# Patient Record
Sex: Female | Born: 1975
Health system: Southern US, Community
[De-identification: ages and names within clinical notes are randomized; demographics above are authoritative.]

## PROBLEM LIST (undated history)

## (undated) DIAGNOSIS — I10 Essential (primary) hypertension: Secondary | ICD-10-CM

## (undated) DIAGNOSIS — J189 Pneumonia, unspecified organism: Secondary | ICD-10-CM

## (undated) DIAGNOSIS — N870 Mild cervical dysplasia: Secondary | ICD-10-CM

## (undated) DIAGNOSIS — F419 Anxiety disorder, unspecified: Secondary | ICD-10-CM

## (undated) DIAGNOSIS — D689 Coagulation defect, unspecified: Secondary | ICD-10-CM

## (undated) DIAGNOSIS — R896 Abnormal cytological findings in specimens from other organs, systems and tissues: Secondary | ICD-10-CM

## (undated) DIAGNOSIS — F329 Major depressive disorder, single episode, unspecified: Secondary | ICD-10-CM

## (undated) DIAGNOSIS — F17200 Nicotine dependence, unspecified, uncomplicated: Secondary | ICD-10-CM

## (undated) DIAGNOSIS — B977 Papillomavirus as the cause of diseases classified elsewhere: Secondary | ICD-10-CM

## (undated) DIAGNOSIS — R51 Headache: Secondary | ICD-10-CM

## (undated) DIAGNOSIS — C801 Malignant (primary) neoplasm, unspecified: Secondary | ICD-10-CM

## (undated) DIAGNOSIS — R569 Unspecified convulsions: Secondary | ICD-10-CM

## (undated) DIAGNOSIS — D6800 Von Willebrand disease, unspecified: Secondary | ICD-10-CM

## (undated) DIAGNOSIS — K219 Gastro-esophageal reflux disease without esophagitis: Secondary | ICD-10-CM

## (undated) DIAGNOSIS — M1990 Unspecified osteoarthritis, unspecified site: Secondary | ICD-10-CM

## (undated) DIAGNOSIS — N809 Endometriosis, unspecified: Secondary | ICD-10-CM

## (undated) DIAGNOSIS — T7840XA Allergy, unspecified, initial encounter: Secondary | ICD-10-CM

## (undated) DIAGNOSIS — F32A Depression, unspecified: Secondary | ICD-10-CM

## (undated) HISTORY — DX: Gastro-esophageal reflux disease without esophagitis: K21.9

## (undated) HISTORY — PX: TONSILLECTOMY: SUR1361

## (undated) HISTORY — DX: Allergy, unspecified, initial encounter: T78.40XA

## (undated) HISTORY — PX: BREAST BIOPSY: SHX20

## (undated) HISTORY — DX: Papillomavirus as the cause of diseases classified elsewhere: B97.7

## (undated) HISTORY — DX: Depression, unspecified: F32.A

## (undated) HISTORY — DX: Major depressive disorder, single episode, unspecified: F32.9

## (undated) HISTORY — DX: Mild cervical dysplasia: N87.0

## (undated) HISTORY — DX: Von Willebrand's disease: D68.0

## (undated) HISTORY — PX: COLPOSCOPY: SHX161

## (undated) HISTORY — DX: Nicotine dependence, unspecified, uncomplicated: F17.200

## (undated) HISTORY — PX: BRAIN SURGERY: SHX531

## (undated) HISTORY — DX: Coagulation defect, unspecified: D68.9

## (undated) HISTORY — PX: DILATION AND CURETTAGE OF UTERUS: SHX78

## (undated) HISTORY — DX: Endometriosis, unspecified: N80.9

## (undated) HISTORY — DX: Von Willebrand disease, unspecified: D68.00

---
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
## 1999-02-20 ENCOUNTER — Emergency Department (HOSPITAL_COMMUNITY): Admission: EM | Admit: 1999-02-20 | Discharge: 1999-02-20 | Payer: Self-pay | Admitting: *Deleted

## 1999-02-20 ENCOUNTER — Encounter: Payer: Self-pay | Admitting: *Deleted

## 1999-07-04 ENCOUNTER — Emergency Department (HOSPITAL_COMMUNITY): Admission: EM | Admit: 1999-07-04 | Discharge: 1999-07-04 | Payer: Self-pay | Admitting: *Deleted

## 2003-09-12 ENCOUNTER — Emergency Department (HOSPITAL_COMMUNITY): Admission: EM | Admit: 2003-09-12 | Discharge: 2003-09-12 | Payer: Self-pay | Admitting: Emergency Medicine

## 2004-06-17 ENCOUNTER — Encounter: Admission: RE | Admit: 2004-06-17 | Discharge: 2004-09-15 | Payer: Self-pay | Admitting: Internal Medicine

## 2007-10-29 ENCOUNTER — Encounter: Admission: RE | Admit: 2007-10-29 | Discharge: 2007-10-29 | Payer: Self-pay | Admitting: Pediatrics

## 2007-11-27 ENCOUNTER — Emergency Department (HOSPITAL_COMMUNITY): Admission: EM | Admit: 2007-11-27 | Discharge: 2007-11-27 | Payer: Self-pay | Admitting: Emergency Medicine

## 2008-08-31 ENCOUNTER — Emergency Department (HOSPITAL_COMMUNITY): Admission: EM | Admit: 2008-08-31 | Discharge: 2008-09-01 | Payer: Self-pay | Admitting: Emergency Medicine

## 2008-09-09 ENCOUNTER — Other Ambulatory Visit: Admission: RE | Admit: 2008-09-09 | Discharge: 2008-09-09 | Payer: Self-pay | Admitting: Obstetrics and Gynecology

## 2009-01-02 ENCOUNTER — Emergency Department (HOSPITAL_COMMUNITY): Admission: EM | Admit: 2009-01-02 | Discharge: 2009-01-02 | Payer: Self-pay | Admitting: Emergency Medicine

## 2009-11-30 ENCOUNTER — Emergency Department (HOSPITAL_COMMUNITY): Admission: EM | Admit: 2009-11-30 | Discharge: 2009-11-30 | Payer: Self-pay | Admitting: Emergency Medicine

## 2010-05-12 LAB — URINALYSIS, ROUTINE W REFLEX MICROSCOPIC
Hgb urine dipstick: NEGATIVE
Specific Gravity, Urine: 1.019 (ref 1.005–1.030)
Urobilinogen, UA: 0.2 mg/dL (ref 0.0–1.0)
pH: 7.5 (ref 5.0–8.0)

## 2010-05-12 LAB — BASIC METABOLIC PANEL
Chloride: 104 mEq/L (ref 96–112)
GFR calc non Af Amer: >60 mL/min (ref >60)
Potassium: 4.2 mEq/L (ref 3.5–5.1)
Sodium: 139 mEq/L (ref 135–145)

## 2010-05-12 LAB — DIFFERENTIAL
Basophils Absolute: 0 10*3/uL (ref 0.0–0.1)
Lymphocytes Relative: 30 % (ref 12–46)
Lymphs Abs: 2.3 10*3/uL (ref 0.7–4.0)
Monocytes Absolute: 0.2 10*3/uL (ref 0.1–1.0)
Monocytes Relative: 3 % (ref 3–12)
Neutro Abs: 4.9 10*3/uL (ref 1.7–7.7)

## 2010-05-12 LAB — CBC
HCT: 36.5 % (ref 36.0–46.0)
Hemoglobin: 12.4 g/dL (ref 12.0–15.0)
RBC: 3.84 MIL/uL — ABNORMAL LOW (ref 3.87–5.11)
RDW: 14.3 % (ref 11.5–15.5)
WBC: 7.5 10*3/uL (ref 4.0–10.5)

## 2010-05-12 LAB — POCT PREGNANCY, URINE: Preg Test, Ur: NEGATIVE

## 2010-05-12 LAB — D-DIMER, QUANTITATIVE: D-Dimer, Quant: 0.26 ug/mL-FEU (ref 0.00–0.48)

## 2010-06-01 LAB — URINALYSIS, ROUTINE W REFLEX MICROSCOPIC
Glucose, UA: NEGATIVE mg/dL
Hgb urine dipstick: NEGATIVE
Ketones, ur: NEGATIVE mg/dL
Protein, ur: NEGATIVE mg/dL

## 2010-06-01 LAB — BASIC METABOLIC PANEL
BUN: 11 mg/dL (ref 6–23)
CO2: 28 mEq/L (ref 19–32)
Chloride: 103 mEq/L (ref 96–112)
Creatinine, Ser: 0.57 mg/dL (ref 0.4–1.2)

## 2010-06-05 LAB — URINALYSIS, ROUTINE W REFLEX MICROSCOPIC
Bilirubin Urine: NEGATIVE
Ketones, ur: NEGATIVE mg/dL
Nitrite: NEGATIVE
Urobilinogen, UA: 0.2 mg/dL (ref 0.0–1.0)

## 2010-06-05 LAB — URINE MICROSCOPIC-ADD ON

## 2010-06-05 LAB — POCT I-STAT, CHEM 8
BUN: 10 mg/dL (ref 6–23)
Calcium, Ion: 1.1 mmol/L — ABNORMAL LOW (ref 1.12–1.32)
Chloride: 105 meq/L (ref 96–112)
Creatinine, Ser: 0.5 mg/dL (ref 0.4–1.2)
Glucose, Bld: 102 mg/dL — ABNORMAL HIGH (ref 70–99)
HCT: 43 % (ref 36.0–46.0)
Hemoglobin: 14.6 g/dL (ref 12.0–15.0)
Potassium: 3.4 mEq/L — ABNORMAL LOW (ref 3.5–5.1)
Sodium: 137 mEq/L (ref 135–145)
TCO2: 23 mmol/L (ref 0–100)

## 2010-06-05 LAB — DIFFERENTIAL
Basophils Absolute: 0.1 10*3/uL (ref 0.0–0.1)
Basophils Relative: 1 % (ref 0–1)
Eosinophils Relative: 0 % (ref 0–5)
Monocytes Absolute: 0.7 10*3/uL (ref 0.1–1.0)

## 2010-06-05 LAB — CBC
HCT: 40 % (ref 36.0–46.0)
Hemoglobin: 13.6 g/dL (ref 12.0–15.0)
MCHC: 34.1 g/dL (ref 30.0–36.0)
MCV: 90.4 fL (ref 78.0–100.0)
RDW: 14.2 % (ref 11.5–15.5)

## 2010-06-05 LAB — WET PREP, GENITAL
Trich, Wet Prep: NONE SEEN
Yeast Wet Prep HPF POC: NONE SEEN

## 2010-06-05 LAB — POCT PREGNANCY, URINE: Preg Test, Ur: NEGATIVE

## 2010-07-12 ENCOUNTER — Emergency Department (HOSPITAL_COMMUNITY): Payer: PRIVATE HEALTH INSURANCE

## 2010-07-12 ENCOUNTER — Emergency Department (HOSPITAL_COMMUNITY)
Admission: EM | Admit: 2010-07-12 | Discharge: 2010-07-13 | Disposition: A | Discharge status: Home / Self Care | Payer: PRIVATE HEALTH INSURANCE | Attending: Emergency Medicine | Admitting: Emergency Medicine

## 2010-07-12 DIAGNOSIS — M940 Chondrocostal junction syndrome [Tietze]: Secondary | ICD-10-CM | POA: Insufficient documentation

## 2010-07-12 DIAGNOSIS — G40909 Epilepsy, unspecified, not intractable, without status epilepticus: Secondary | ICD-10-CM | POA: Insufficient documentation

## 2010-07-12 DIAGNOSIS — R0602 Shortness of breath: Secondary | ICD-10-CM | POA: Insufficient documentation

## 2010-07-12 DIAGNOSIS — R071 Chest pain on breathing: Secondary | ICD-10-CM | POA: Insufficient documentation

## 2010-07-12 DIAGNOSIS — I498 Other specified cardiac arrhythmias: Secondary | ICD-10-CM | POA: Insufficient documentation

## 2010-07-12 DIAGNOSIS — F17200 Nicotine dependence, unspecified, uncomplicated: Secondary | ICD-10-CM | POA: Insufficient documentation

## 2010-08-28 DIAGNOSIS — N870 Mild cervical dysplasia: Secondary | ICD-10-CM

## 2010-08-28 HISTORY — DX: Mild cervical dysplasia: N87.0

## 2010-09-12 ENCOUNTER — Other Ambulatory Visit: Payer: Self-pay | Admitting: Gynecology

## 2010-09-12 ENCOUNTER — Ambulatory Visit (INDEPENDENT_AMBULATORY_CARE_PROVIDER_SITE_OTHER): Payer: PRIVATE HEALTH INSURANCE | Admitting: Gynecology

## 2010-09-12 ENCOUNTER — Other Ambulatory Visit (HOSPITAL_COMMUNITY)
Admission: RE | Admit: 2010-09-12 | Discharge: 2010-09-12 | Disposition: A | Discharge status: Home / Self Care | Payer: PRIVATE HEALTH INSURANCE | Source: Ambulatory Visit | Attending: Gynecology | Admitting: Gynecology

## 2010-09-12 DIAGNOSIS — Z13220 Encounter for screening for lipoid disorders: Secondary | ICD-10-CM

## 2010-09-12 DIAGNOSIS — N920 Excessive and frequent menstruation with regular cycle: Secondary | ICD-10-CM

## 2010-09-12 DIAGNOSIS — Z124 Encounter for screening for malignant neoplasm of cervix: Secondary | ICD-10-CM | POA: Insufficient documentation

## 2010-09-12 DIAGNOSIS — N926 Irregular menstruation, unspecified: Secondary | ICD-10-CM

## 2010-09-12 DIAGNOSIS — R823 Hemoglobinuria: Secondary | ICD-10-CM

## 2010-09-12 DIAGNOSIS — Z833 Family history of diabetes mellitus: Secondary | ICD-10-CM

## 2010-09-12 DIAGNOSIS — Z01419 Encounter for gynecological examination (general) (routine) without abnormal findings: Secondary | ICD-10-CM

## 2010-09-12 DIAGNOSIS — Z1231 Encounter for screening mammogram for malignant neoplasm of breast: Secondary | ICD-10-CM

## 2010-09-12 DIAGNOSIS — L251 Unspecified contact dermatitis due to drugs in contact with skin: Secondary | ICD-10-CM

## 2010-09-19 ENCOUNTER — Telehealth: Payer: Self-pay | Admitting: *Deleted

## 2010-09-19 NOTE — Telephone Encounter (Signed)
CALL COMPLETED. JW

## 2010-09-19 NOTE — Telephone Encounter (Signed)
PT CALLED BACK FROM WHEN MESSAGE LEFT RE:C&B APPT. NEEDED. CALLED PT BACK AND SPOKE WITH PT TO SCHEDULE APPT.

## 2010-09-22 ENCOUNTER — Telehealth: Payer: Self-pay | Admitting: *Deleted

## 2010-09-22 DIAGNOSIS — N939 Abnormal uterine and vaginal bleeding, unspecified: Secondary | ICD-10-CM

## 2010-09-22 MED ORDER — MEGESTROL ACETATE 20 MG PO TABS: 20.0000 mg | ORAL_TABLET | Freq: Two times a day (BID) | ORAL | Status: DC

## 2010-09-22 NOTE — Telephone Encounter (Signed)
(  SEE 09/12/10 NOTE IN HARD COPY) PT CALLED STATING SHE IS STILL BLEEDING ON LAST DAY OF PROVERA. HAS A SONOHYSTEROGRAPHY APPOINTMENT ON 09/26/10 WANTS TO KNOW IF SHE SHOULD RESCHEDULE?

## 2010-09-22 NOTE — Telephone Encounter (Signed)
PT INFORMED WITH THE BELOW MESSAGE. RX SENT TO PHARMACY.

## 2010-09-22 NOTE — Telephone Encounter (Signed)
Start patient on Megace 20 mg twice a day followup sonohysterogram appointment. Call in #20.

## 2010-09-26 ENCOUNTER — Other Ambulatory Visit: Payer: Self-pay

## 2010-09-26 ENCOUNTER — Inpatient Hospital Stay
Admission: RE | Admit: 2010-09-26 | Discharge: 2010-09-26 | Disposition: A | Discharge status: Home / Self Care | Payer: Self-pay | Source: Ambulatory Visit | Attending: Gynecology | Admitting: Gynecology

## 2010-09-26 ENCOUNTER — Encounter: Payer: Self-pay | Admitting: Gynecology

## 2010-09-26 ENCOUNTER — Telehealth: Payer: Self-pay

## 2010-09-26 ENCOUNTER — Ambulatory Visit (INDEPENDENT_AMBULATORY_CARE_PROVIDER_SITE_OTHER): Payer: PRIVATE HEALTH INSURANCE | Admitting: Gynecology

## 2010-09-26 ENCOUNTER — Other Ambulatory Visit: Payer: PRIVATE HEALTH INSURANCE

## 2010-09-26 DIAGNOSIS — N938 Other specified abnormal uterine and vaginal bleeding: Secondary | ICD-10-CM

## 2010-09-26 DIAGNOSIS — N949 Unspecified condition associated with female genital organs and menstrual cycle: Secondary | ICD-10-CM

## 2010-09-26 DIAGNOSIS — N920 Excessive and frequent menstruation with regular cycle: Secondary | ICD-10-CM

## 2010-09-26 DIAGNOSIS — N946 Dysmenorrhea, unspecified: Secondary | ICD-10-CM

## 2010-09-26 DIAGNOSIS — N921 Excessive and frequent menstruation with irregular cycle: Secondary | ICD-10-CM

## 2010-09-26 DIAGNOSIS — N870 Mild cervical dysplasia: Secondary | ICD-10-CM

## 2010-09-26 DIAGNOSIS — N8300 Follicular cyst of ovary, unspecified side: Secondary | ICD-10-CM

## 2010-09-26 DIAGNOSIS — D259 Leiomyoma of uterus, unspecified: Secondary | ICD-10-CM

## 2010-09-26 DIAGNOSIS — D251 Intramural leiomyoma of uterus: Secondary | ICD-10-CM

## 2010-09-26 NOTE — Telephone Encounter (Signed)
Per Dr. Velvet Bathe order I called patient to schedule Hysteroscopy, D&C.  It is scheduled for Friday, August 17 at 7:30am at [facility]. Advised patient [facility] will be calling her with all her instructions.

## 2010-09-26 NOTE — Progress Notes (Signed)
Patient presents for sonohysterogram due to menorrhagia. She has a history of irregular periods but heavy then started 3 weeks prior and persisted bleeding on and off. She was placed on Megace for menstrual suppression and schedules a sonohysterogram. Of note her TSH and hCG were normal she did have a significantly elevated cholesterol at 335 and an LDL of 218 for which she knows to follow up with her primary M.D. Her ultrasound initially shows an endometrial echo of 15 mm right left ovaries are normal with physiologic changes and a small anterior myoma at 21 x 15 mm.  Sonohysterogram was performed sterile technique easy catheter duction good distention with multiple fingerlike projections suggestive of multiple polyps or a hyperplastic endometrium. Endometrial sample was taken. I discussed with patient regardless of return I think it would be prudent to proceed with hysteroscopy D&C to clear the endometrium. From a long-term standpoint I think a Mirena IUD would be a great choice for menstrual suppression as well as contraception but I would feel more comfortable clearing the endometrium with a more complete sample with a hysteroscopy D&C. We also discussed her dysplasia and she has a colposcopy appointment scheduled for next week. I discussed the various issues with dysplasia and she knows importance of followup. I discussed what is involved with hysteroscopy to include instrumentation use of the resectoscope and the D&C portion of the procedure. The risks of infection, hemorrhage necessitating transfusion, uterine perforation with damage to internal organs including bowel bladder ureters vessels and nerves either immediately recognized or delay recognized necessitating major exploratory reparative surgeries future reparative surgeries. The risk of distended media absorption with metabolic complications such as coma and seizures was also discussed with her. She agrees with proceeding with the hysteroscopy D&C and  we'll schedule this at her convenience. She does have a history of von Willebrand's but does not use DDAVP or any other medications to control I don't think this would be an issue with her procedure.

## 2010-09-27 ENCOUNTER — Ambulatory Visit: Payer: PRIVATE HEALTH INSURANCE

## 2010-09-28 ENCOUNTER — Telehealth: Payer: Self-pay | Admitting: *Deleted

## 2010-09-28 DIAGNOSIS — N926 Irregular menstruation, unspecified: Secondary | ICD-10-CM

## 2010-09-28 MED ORDER — MEGESTROL ACETATE 20 MG PO TABS: 20.0000 mg | ORAL_TABLET | Freq: Every day | ORAL | Status: DC

## 2010-09-28 NOTE — Telephone Encounter (Signed)
CHART ON YOUR DESK)PT CALLED STATING THAT SHE HAS STARTED  BLEEDING AGAIN. NOT A HEAVY FLOW NOW BUT SHE DOES STATE THAT IT FEELS LIKE HER PERIOD IS ABOUT TO START. SHE WANT TO KNOW IF SHE NEEDS START ON PROVERA AGAIN? SHE HAS A APPOINTMENT ON 10/03/10 FOR C&B.PLEASE ADVISE.

## 2010-09-28 NOTE — Telephone Encounter (Signed)
PT INFORMED WITH THE BELOW NOTE. 

## 2010-09-28 NOTE — Telephone Encounter (Signed)
Megace 20 mg twice a day times several days then go to once a day and stay on this through her hysteroscopy D&C

## 2010-09-30 ENCOUNTER — Encounter: Payer: Self-pay | Admitting: *Deleted

## 2010-10-03 ENCOUNTER — Encounter: Payer: Self-pay | Admitting: Gynecology

## 2010-10-03 ENCOUNTER — Ambulatory Visit (INDEPENDENT_AMBULATORY_CARE_PROVIDER_SITE_OTHER): Payer: PRIVATE HEALTH INSURANCE | Admitting: Gynecology

## 2010-10-03 VITALS — BP 132/90

## 2010-10-03 DIAGNOSIS — N870 Mild cervical dysplasia: Secondary | ICD-10-CM

## 2010-10-03 DIAGNOSIS — N920 Excessive and frequent menstruation with regular cycle: Secondary | ICD-10-CM

## 2010-10-03 NOTE — Progress Notes (Signed)
Patient presents with her husband for CMP had her first abnormal Pap smear showing low-grade SIL changes. She's also scheduled the end of next week for hysteroscopy D&C secondary to menorrhagia with sonohysterogram suggestive of questionable polyps with a very irregular endometrial border.  Colposcopic evaluation today acetic acid cleanse is adequate with an area of acetowhite change from 10 to 12:00 transformation zone. Representative biopsy was taken.  A long discussion with the patient and her husband about cervical dysplasia, high-grade, low-grade, progression or regression, HPV Association. She will followup within the week for her biopsy results. If normal or low-grade and we'll plan expectant management with every six-month Pap smears, if high grade we'll discuss treatment options.    She is without questions in reference to her upcoming hysteroscopy D&C. We are trying to find out the more about her history of von Willebrand's and whether any pretreatment as needed. Historically she does not have spontaneous bleeding issues and is not recommended to use anything as pretreatment.

## 2010-10-04 ENCOUNTER — Telehealth: Payer: Self-pay | Admitting: Gynecology

## 2010-10-04 NOTE — Telephone Encounter (Signed)
error 

## 2010-10-06 ENCOUNTER — Telehealth: Payer: Self-pay

## 2010-10-06 NOTE — Telephone Encounter (Signed)
I called patient and left message that on Tuesday 8/7 I had faxed request to [facility] to let Dr. Audie Box know if she would require any pretreatment for her Von Willebrands, prior to surgery.  I have not heard anything back from them yet and wondered if maybe that had contacted her for an appt instead of me.  I did let her know that it is possible we could have to reschedule her surgery if they cannot see her or get the info to Dr. Velvet Bathe before surgery date.

## 2010-10-06 NOTE — Telephone Encounter (Signed)
Pt. Called me back to let me know that [facility] called her on Tuesday. A lady named Dixie [phone]) called her.  They prescribed Stimate (? Sp) Nasal Spray for her to use for five days after surgery. She said they did not prescribe anything for before surgery.  I told her I will call them and ask them to fax me note.  She said the lady had told her that she was going to send Dr. Velvet Bathe the info.  I did put in a call to Dixie's voice mail and asked her to fax the info or call me.  I told patient I will call her if Dr. Velvet Bathe has any concerns or questions. Otherwise, surgery is a go.

## 2010-10-07 ENCOUNTER — Encounter (HOSPITAL_COMMUNITY): Payer: Self-pay

## 2010-10-07 ENCOUNTER — Encounter (HOSPITAL_COMMUNITY)
Admission: RE | Admit: 2010-10-07 | Discharge: 2010-10-07 | Disposition: A | Discharge status: Home / Self Care | Payer: PRIVATE HEALTH INSURANCE | Source: Ambulatory Visit | Attending: Gynecology | Admitting: Gynecology

## 2010-10-07 HISTORY — DX: Unspecified convulsions: R56.9

## 2010-10-07 HISTORY — DX: Anxiety disorder, unspecified: F41.9

## 2010-10-07 HISTORY — DX: Headache: R51

## 2010-10-07 HISTORY — DX: Unspecified osteoarthritis, unspecified site: M19.90

## 2010-10-07 LAB — CBC
HCT: 37.5 % (ref 36.0–46.0)
MCH: 31.2 pg (ref 26.0–34.0)
MCHC: 33.1 g/dL (ref 30.0–36.0)
MCV: 94.2 fL (ref 78.0–100.0)
RDW: 14.5 % (ref 11.5–15.5)

## 2010-10-07 LAB — COMPREHENSIVE METABOLIC PANEL
Albumin: 3.6 g/dL (ref 3.5–5.2)
BUN: 12 mg/dL (ref 6–23)
Calcium: 9.4 mg/dL (ref 8.4–10.5)
Creatinine, Ser: 0.63 mg/dL (ref 0.50–1.10)
GFR calc Af Amer: >60 mL/min (ref >60)
Total Protein: 7.2 g/dL (ref 6.0–8.3)

## 2010-10-07 LAB — PROTIME-INR
INR: 0.89 (ref 0.00–1.49)
Prothrombin Time: 12.2 seconds (ref 11.6–15.2)

## 2010-10-07 NOTE — Patient Instructions (Signed)
20 Unique Sillas  10/07/2010   Your procedure is scheduled on:  10/14/10  Report to [facility] at 0600 AM.  Call this number if you have problems the morning of surgery: [phone]   Remember:   Do not eat food:After Midnight.  Do not drink clear liquids: After Midnight.  Take these medicines the morning of surgery with A SIP OF WATER: per anesthesia   Do not wear jewelry, make-up or nail polish.  Do not wear lotions, powders, or perfumes. You may wear deodorant.  Do not shave 48 hours prior to surgery.  Do not bring valuables to the hospital.  Contacts, dentures or bridgework may not be worn into surgery.  Leave suitcase in the car. After surgery it may be brought to your room.  For patients admitted to the hospital, checkout time is 11:00 AM the day of discharge.   Patients discharged the day of surgery will not be allowed to drive home.  Name and phone number of your driver: Jenel Lucks- [phone]  Special Instructions: none

## 2010-10-07 NOTE — Anesthesia Preprocedure Evaluation (Signed)
Anesthesia Evaluation  Name, MR# and DOB Patient awake  General Assessment Comment  Reviewed: Allergy & Precautions, H&P , NPO status , Patient's Chart, lab work & pertinent test results  Airway Mallampati: III TM Distance: >3 FB Neck ROM: Full    Dental No notable dental hx. (+) Teeth Intact   Pulmonary  asthma and Inhaler asthma  clear to auscultation  pulmonary exam normalPulmonary Exam Normal breath sounds clear to auscultation none    Cardiovascular Regular Normal    Neuro/Psych    (+) Anxiety,  Negative Neurological ROS  Negative Psych ROS  GI/Hepatic/Renal negative GI ROS, negative Liver ROS, and negative Renal ROS (+)       Endo/Other  Negative Endocrine ROS (+)   Morbid obesity  Abdominal (+) obese,   Musculoskeletal   Hematology   Peds  Reproductive/Obstetrics    Anesthesia Other Findings             Anesthesia Physical Anesthesia Plan  ASA: III  Anesthesia Plan: General   Post-op Pain Management:    Induction: Intravenous  Airway Management Planned: LMA  Additional Equipment:   Intra-op Plan:   Post-operative Plan: Extubation in OR  Informed Consent: I have reviewed the patients History and Physical, chart, labs and discussed the procedure including the risks, benefits and alternatives for the proposed anesthesia with the patient or authorized representative who has indicated his/her understanding and acceptance.   Dental advisory given  Plan Discussed with: Anesthesiologist (AP)  Anesthesia Plan Comments:         Anesthesia Quick Evaluation

## 2010-10-11 NOTE — H&P (Signed)
  Cassidy Tran [birth year] [id number]   History and Physical    Chief complaint: menorrhagia irregular menses  History of present illness: 35 year old G0 history of regular menses although heavy when they occur lasting 7-8 days requiring double protection with bleed through episodes. Most recently she had a three-week history of prolonged bleeding ultimately was started on Megace for bleeding control and had a sonohysterogram which showed multiple fingerlike projections represent either a hyperplastic endometrium or endometrial polyps. Patient is admitted for hysteroscopy D&C removal of endometrial polyps. Her outpatient evaluation included a negative endometrial biopsy a normal TSH and a CBC showing a hemoglobin of 13.8. She does have a history of von Willebrand's but is not chronically treated with medication for this.  She also received had a Pap smear showing low-grade SIL changes with colposcopic directed biopsies confirming low-grade changes with planned followup Pap in 6 months.  Past medical history:  Von Willebrand's disease, seizure disorder, asthma  Past surgical history:  Tonsillectomy, sinus surgery, oral wisdom teeth extraction  Allergies:  Aspirin; Codeine; Erythromycin; and Penicillins  Current medications:  Per medication reconciliation  Review of systems: Noncontributory  Family history: Noncontributory   Social history: Noncontributory   Physical exam:  Afebrile vital signs are stable  General: well developed, well nourished female, no acute distress HEENT: normal  Lungs: clear to auscultation without wheezing, rales or rhonchi  Cardiac: regular rate without rubs, murmurs or gallops  Abdomen: soft, nontender without masses, guarding, rebound, organomegaly  Pelvic: external bus vagina: normal   Cervix: grossly normal  Uterus: normal size, midline and mobile, nontender  Adnexa: without masses or tenderness  Rectovaginal exam within normal limits   Assessment:  43 six-year-old G0 with menorrhagia recent prolonged bleeding episode negative hormone studies and a sonohysterogram suggesting a hyperplastic endometrial pattern versus multiple fingerlike endometrial polyps.  Endometrial biopsy was benign.  She does have a history of von Willebrand's disease.   Plan: Patient is admitted for hysteroscopy D&C.  I discussed what is involved with hysteroscopy to include instrumentation use of the resectoscope and the D&C portion of the procedure. The risks of infection, hemorrhage necessitating transfusion and the risks of transfusion, uterine perforation with damage to internal organs including bowel bladder ureters vessels and nerves either immediately recognized or delay recognized necessitating major exploratory reparative surgeries future reparative surgeries. The Tran of distended media absorption with metabolic complications such as coma and seizures was also discussed with her. She does have a history of von Willebrand's disease we contacted her hematologist Dr. Marlena Clipper who recommended DDAVP 0.3 mcg per kilogram in 100 cc normal saline IV over 20 minutes one hour preop. He had prescribed DDAVP nasal spray for her to be used one spray each nostril starting 24 hours postop for 5 days. Patient's questions were answered and she is ready to proceed with surgery.    Dara Lords MD, 4:39 PM 10/11/2010

## 2010-10-13 MED ORDER — CLINDAMYCIN PHOSPHATE 900 MG/50ML IV SOLN
900.0000 mg | INTRAVENOUS | Status: DC
  Filled 2010-10-13: qty 50

## 2010-10-13 MED ORDER — CIPROFLOXACIN IN D5W 400 MG/200ML IV SOLN
400.0000 mg | INTRAVENOUS | Status: DC
  Filled 2010-10-13: qty 200

## 2010-10-14 ENCOUNTER — Ambulatory Visit (HOSPITAL_COMMUNITY): Payer: PRIVATE HEALTH INSURANCE | Admitting: Anesthesiology

## 2010-10-14 ENCOUNTER — Encounter (HOSPITAL_COMMUNITY)
Admission: RE | Disposition: A | Discharge status: Home Health | Payer: Self-pay | Source: Ambulatory Visit | Attending: Gynecology

## 2010-10-14 ENCOUNTER — Other Ambulatory Visit: Payer: Self-pay | Admitting: Gynecology

## 2010-10-14 ENCOUNTER — Ambulatory Visit (HOSPITAL_COMMUNITY)
Admission: RE | Admit: 2010-10-14 | Discharge: 2010-10-14 | Disposition: A | Discharge status: Home Health | Payer: PRIVATE HEALTH INSURANCE | Source: Ambulatory Visit | Attending: Gynecology | Admitting: Gynecology

## 2010-10-14 ENCOUNTER — Encounter (HOSPITAL_COMMUNITY): Payer: Self-pay | Admitting: Anesthesiology

## 2010-10-14 DIAGNOSIS — N920 Excessive and frequent menstruation with regular cycle: Secondary | ICD-10-CM | POA: Insufficient documentation

## 2010-10-14 DIAGNOSIS — N840 Polyp of corpus uteri: Secondary | ICD-10-CM | POA: Insufficient documentation

## 2010-10-14 DIAGNOSIS — Z01812 Encounter for preprocedural laboratory examination: Secondary | ICD-10-CM | POA: Insufficient documentation

## 2010-10-14 DIAGNOSIS — Z01818 Encounter for other preprocedural examination: Secondary | ICD-10-CM | POA: Insufficient documentation

## 2010-10-14 DIAGNOSIS — N926 Irregular menstruation, unspecified: Secondary | ICD-10-CM | POA: Insufficient documentation

## 2010-10-14 HISTORY — PX: HYSTEROSCOPY WITH D & C: SHX1775

## 2010-10-14 LAB — HCG, SERUM, QUALITATIVE: Preg, Serum: NEGATIVE

## 2010-10-14 SURGERY — DILATATION AND CURETTAGE /HYSTEROSCOPY
Anesthesia: General | Site: Uterus | Wound class: Clean Contaminated

## 2010-10-14 MED ORDER — GLYCINE 1.5 % IR SOLN
Status: DC | PRN
  Administered 2010-10-14: 3000 mL

## 2010-10-14 MED ORDER — ONDANSETRON HCL 4 MG/2ML IJ SOLN: 4.0000 mg | Freq: Once | INTRAMUSCULAR | Status: DC | PRN

## 2010-10-14 MED ORDER — CLINDAMYCIN PHOSPHATE 600 MG/50ML IV SOLN
INTRAVENOUS | Status: DC | PRN
  Administered 2010-10-14: 900 mg via INTRAVENOUS

## 2010-10-14 MED ORDER — MEPERIDINE HCL 25 MG/ML IJ SOLN
INTRAMUSCULAR | Status: AC
  Administered 2010-10-14: 12.5 mg via INTRAMUSCULAR
  Filled 2010-10-14: qty 1

## 2010-10-14 MED ORDER — DESMOPRESSIN ACETATE 4 MCG/ML IJ SOLN
0.3000 ug/kg | Freq: Once | INTRAMUSCULAR | Status: DC
  Filled 2010-10-14 (×4): qty 8.51

## 2010-10-14 MED ORDER — MEPERIDINE HCL 25 MG/ML IJ SOLN: 25.0000 mg | Freq: Once | INTRAMUSCULAR | Status: DC

## 2010-10-14 MED ORDER — FENTANYL CITRATE 0.05 MG/ML IJ SOLN
INTRAMUSCULAR | Status: DC | PRN
  Administered 2010-10-14 (×2): 50 ug via INTRAVENOUS

## 2010-10-14 MED ORDER — MIDAZOLAM HCL 5 MG/5ML IJ SOLN
INTRAMUSCULAR | Status: DC | PRN
  Administered 2010-10-14 (×2): 1 mg via INTRAVENOUS

## 2010-10-14 MED ORDER — PROPOFOL 10 MG/ML IV EMUL
INTRAVENOUS | Status: DC | PRN
  Administered 2010-10-14: 200 mg via INTRAVENOUS
  Administered 2010-10-14: 120 mg via INTRAVENOUS

## 2010-10-14 MED ORDER — FENTANYL CITRATE 0.05 MG/ML IJ SOLN: 25.0000 ug | INTRAMUSCULAR | Status: DC | PRN

## 2010-10-14 MED ORDER — SODIUM CHLORIDE 0.9 % IV SOLN
34.0400 ug | Freq: Once | INTRAVENOUS | Status: DC
  Filled 2010-10-14: qty 8.51

## 2010-10-14 MED ORDER — LIDOCAINE HCL 1 % IJ SOLN
INTRAMUSCULAR | Status: DC | PRN
  Administered 2010-10-14: 10 mL

## 2010-10-14 MED ORDER — METOCLOPRAMIDE HCL 10 MG PO TABS
ORAL_TABLET | ORAL | Status: AC
  Administered 2010-10-14: 10 mg via ORAL
  Filled 2010-10-14: qty 1

## 2010-10-14 MED ORDER — CIPROFLOXACIN IN D5W 400 MG/200ML IV SOLN
INTRAVENOUS | Status: DC | PRN
  Administered 2010-10-14: 400 mg via INTRAVENOUS

## 2010-10-14 MED ORDER — SODIUM CHLORIDE 0.9 % IV SOLN: INTRAVENOUS | Status: DC

## 2010-10-14 MED ORDER — LIDOCAINE HCL (CARDIAC) 20 MG/ML IV SOLN
INTRAVENOUS | Status: DC | PRN
  Administered 2010-10-14: 80 mg via INTRAVENOUS

## 2010-10-14 MED ORDER — OXYCODONE-ACETAMINOPHEN 5-325 MG PO TABS: 1.0000 | ORAL_TABLET | Freq: Four times a day (QID) | ORAL | Status: DC | PRN

## 2010-10-14 MED ORDER — ONDANSETRON HCL 4 MG/2ML IJ SOLN
INTRAMUSCULAR | Status: DC | PRN
  Administered 2010-10-14: 4 mg via INTRAVENOUS

## 2010-10-14 MED ORDER — LACTATED RINGERS IV SOLN
INTRAVENOUS | Status: DC
  Administered 2010-10-14 (×2): via INTRAVENOUS

## 2010-10-14 MED ORDER — METOCLOPRAMIDE HCL 10 MG PO TABS
10.0000 mg | ORAL_TABLET | ORAL | Status: AC
  Administered 2010-10-14: 10 mg via ORAL

## 2010-10-14 SURGICAL SUPPLY — 15 items
CANISTER SUCTION 2500CC (MISCELLANEOUS) ×2 IMPLANT
CATH ROBINSON RED A/P 16FR (CATHETERS) ×2 IMPLANT
CLOTH BEACON ORANGE TIMEOUT ST (SAFETY) ×2 IMPLANT
CONTAINER PREFILL 10% NBF 60ML (FORM) ×3 IMPLANT
DRAPE UTILITY XL STRL (DRAPES) ×2 IMPLANT
ELECT REM PT RETURN 9FT ADLT (ELECTROSURGICAL) ×2
ELECTRODE REM PT RTRN 9FT ADLT (ELECTROSURGICAL) IMPLANT
ELECTRODE ROLLER BARREL 22FR (ELECTROSURGICAL) IMPLANT
ELECTRODE VAPORCUT 22FR (ELECTROSURGICAL) IMPLANT
GLOVE BIO SURGEON STRL SZ7.5 (GLOVE) ×4 IMPLANT
GOWN PREVENTION PLUS LG XLONG (DISPOSABLE) ×2 IMPLANT
LOOP ANGLED CUTTING 22FR (CUTTING LOOP) ×1 IMPLANT
PACK HYSTEROSCOPY LF (CUSTOM PROCEDURE TRAY) ×2 IMPLANT
TOWEL OR 17X24 6PK STRL BLUE (TOWEL DISPOSABLE) ×4 IMPLANT
WATER STERILE IRR 1000ML POUR (IV SOLUTION) ×2 IMPLANT

## 2010-10-14 NOTE — Transfer of Care (Signed)
Immediate Anesthesia Transfer of Care Note  Patient: Cassidy Tran  Procedure(s) Performed:  DILATATION AND CURETTAGE (D&C) /HYSTEROSCOPY  Patient Location: PACU  Anesthesia Type: General  Level of Consciousness: sedated  Airway & Oxygen Therapy: Patient Spontanous Breathing and Patient connected to nasal cannula oxygen  Post-op Assessment: Report given to PACU RN and Post -op Vital signs reviewed and stable  Post vital signs: Reviewed and stable  Complications: No apparent anesthesia complications

## 2010-10-14 NOTE — H&P (Signed)
Dictated history and physical current.  Dara Lords MD, 7:08 AM 10/14/2010

## 2010-10-14 NOTE — Op Note (Signed)
Cassidy Tran 11-Aug-1975 [id number]   Post Operative Note   Date of surgery:  10/14/2010  Pre Op Dx: Menorrhagia,endometrial polyps  Post Op Dx:  Menorrhagia,Endometrial polyps  Procedure:  Hysteroscopy D&C  Surgeon:  Dara Lords  Assistant:  none  Anesthesia:  General  Local Injection:  10 cc 1% lidocaine paracervical block  EBL:  minimal  Complications:  None  Specimen:  Endometrial curetting to pathology   Findings: EUA:  External BUS vagina normal, cervix grossly normal, bimanual uterus normal size midline mobile adnexa without masses   Hysteroscopic: multiple polypoid fingerlike projections filling the cavity in a hyperplastic type pattern. After curettage reinspection showed an empty cavity, good distention,with fundus, anterior, posterior uterine surfaces, lower uterine segment, right and left tubal ostia all visualized.   Procedure:  Patient was taken to the operating room underwent general anesthesia placed in the low dorsal lithotomy position and received a perineal vaginal preparation with Betadine solution. Bladder was emptied with in and out Foley catheterization done in sterile technique and an EUA was performed. Patient was draped in usual fashion, the cervix visualized with a speculum anterior lip grasped with a single-tooth tenaculum and a paracervical block using 1% lidocaine was placed a total of 10 cc. The cervix was then gently and gradually dilated to admit the operative hysteroscope and hysteroscopy was performed with findings noted above. A sharp curettage was performed with abundant return of endometrial tissue and this was all sent to pathology. Rehysteroscopy showed an empty cavity good distention no evidence of perforation. The tenaculum was removed there was some slight oozing at the tenaculum site and a 3-0 chromic interrupted suture was placed to achieve hemostasis. There was no active bleeding noted either at the cervical os or the tenaculum  site and the speculum was removed.  The patient was placed in supine position and awakened without difficulty and taken to the recovery room in good condition having tolerated the procedure well.    Dara Lords MD, 8:14 AM 10/14/2010

## 2010-10-17 NOTE — Anesthesia Postprocedure Evaluation (Addendum)
Anesthesia Post Note  Patient: Cassidy Tran  Procedure(s) Performed:  DILATATION AND CURETTAGE (D&C) /HYSTEROSCOPY  Anesthesia type: General  Patient location: PACU  Post pain: Pain level controlled  Post assessment: Post-op Vital signs reviewed  Post vital signs: Reviewed  Level of consciousness: sedated  Complications: No apparent anesthesia complications

## 2010-10-20 ENCOUNTER — Telehealth: Payer: Self-pay | Admitting: *Deleted

## 2010-10-20 NOTE — Telephone Encounter (Signed)
PT CALLED STATING SHE HAD SOME SPOTTING FROM HER SURGERY ON 10/14/10. EXPLAIN TO PT THIS NORMAL, PT HAD NO OTHER COMPLAINTS.

## 2010-10-28 ENCOUNTER — Encounter: Payer: Self-pay | Admitting: Gynecology

## 2010-10-28 ENCOUNTER — Ambulatory Visit (INDEPENDENT_AMBULATORY_CARE_PROVIDER_SITE_OTHER): Payer: PRIVATE HEALTH INSURANCE | Admitting: Gynecology

## 2010-10-28 DIAGNOSIS — D759 Disease of blood and blood-forming organs, unspecified: Secondary | ICD-10-CM | POA: Insufficient documentation

## 2010-10-28 DIAGNOSIS — IMO0001 Reserved for inherently not codable concepts without codable children: Secondary | ICD-10-CM | POA: Insufficient documentation

## 2010-10-28 DIAGNOSIS — D6800 Von Willebrand disease, unspecified: Secondary | ICD-10-CM

## 2010-10-28 DIAGNOSIS — Z09 Encounter for follow-up examination after completed treatment for conditions other than malignant neoplasm: Secondary | ICD-10-CM

## 2010-10-28 DIAGNOSIS — G40909 Epilepsy, unspecified, not intractable, without status epilepticus: Secondary | ICD-10-CM | POA: Insufficient documentation

## 2010-10-28 DIAGNOSIS — R51 Headache: Secondary | ICD-10-CM | POA: Insufficient documentation

## 2010-10-28 DIAGNOSIS — M1990 Unspecified osteoarthritis, unspecified site: Secondary | ICD-10-CM | POA: Insufficient documentation

## 2010-10-28 DIAGNOSIS — N870 Mild cervical dysplasia: Secondary | ICD-10-CM

## 2010-10-28 DIAGNOSIS — F419 Anxiety disorder, unspecified: Secondary | ICD-10-CM | POA: Insufficient documentation

## 2010-10-28 DIAGNOSIS — N926 Irregular menstruation, unspecified: Secondary | ICD-10-CM

## 2010-10-28 DIAGNOSIS — Z309 Encounter for contraceptive management, unspecified: Secondary | ICD-10-CM

## 2010-10-28 DIAGNOSIS — J45909 Unspecified asthma, uncomplicated: Secondary | ICD-10-CM | POA: Insufficient documentation

## 2010-10-28 DIAGNOSIS — F17200 Nicotine dependence, unspecified, uncomplicated: Secondary | ICD-10-CM | POA: Insufficient documentation

## 2010-10-28 DIAGNOSIS — R519 Headache, unspecified: Secondary | ICD-10-CM | POA: Insufficient documentation

## 2010-10-28 NOTE — Progress Notes (Signed)
Patient presents with her husband for postop checkup status post hysteroscopy D&C removal of endometrial polyp. I reviewed pathology with them that showed polypoid endometrium benign.  Patient reports doing well.  Exam Pelvic external BUS vagina normal, cervix normal small chromic suture 12:00, it uterus normal size midline mobile nontender, adnexa without masses or tenderness  Assessment and plan: #1 History of irregular menses. Status post hysteroscopy D&C removal of polypoid endometrium. As per prior discussion various options for long-term management were reviewed. I did suggest again that she consider Mirena IUD I gave her literature on that. She is not using anything for contraception and this optionwill provide contraception as well as menstrual suppression. Given her age and smoking I don't think estrogen-containing options would be wise. Alternatives such as Depo-Provera and Implanon were also reviewed. If she decides against anything at this point to keep menstrual calendar as long as her periods resume regular will follow if she has any long breaks or continued bleeding she knows to call me. #2 History of low-grade SIL, few cells suggest higher lesion Pap smear. Patient had colposcopy and biopsy which showed HPV effect.  I recommended that she repeat her Pap smear in 6 months that we follow this expectantly at this time I stressed the need for followup and she agrees with this.

## 2010-11-01 ENCOUNTER — Encounter: Payer: Self-pay | Admitting: Gynecology

## 2010-11-07 ENCOUNTER — Encounter (HOSPITAL_COMMUNITY): Payer: Self-pay | Admitting: Gynecology

## 2010-11-10 ENCOUNTER — Telehealth: Payer: Self-pay | Admitting: *Deleted

## 2010-11-10 NOTE — Telephone Encounter (Signed)
Pt called c/o bleeding dark black blood since 11/01/10 with extreme bad cramping. Pt says she took tylenol and used hot compresses to try to help with the pain. Pt informed to make appointment to be seen, she is aware that tf is out of the office until Monday.

## 2010-11-11 ENCOUNTER — Ambulatory Visit (INDEPENDENT_AMBULATORY_CARE_PROVIDER_SITE_OTHER): Payer: PRIVATE HEALTH INSURANCE | Admitting: Gynecology

## 2010-11-11 ENCOUNTER — Encounter: Payer: Self-pay | Admitting: Gynecology

## 2010-11-11 DIAGNOSIS — N938 Other specified abnormal uterine and vaginal bleeding: Secondary | ICD-10-CM

## 2010-11-11 DIAGNOSIS — R823 Hemoglobinuria: Secondary | ICD-10-CM

## 2010-11-11 DIAGNOSIS — N949 Unspecified condition associated with female genital organs and menstrual cycle: Secondary | ICD-10-CM

## 2010-11-11 DIAGNOSIS — R102 Pelvic and perineal pain: Secondary | ICD-10-CM

## 2010-11-11 NOTE — Progress Notes (Signed)
Patient presented to the office today stating she's having low abdominal cramping for the past few days and she had some brownish black discharge. Review of her records indicated that in August 17 Dr. Audie Box had done diagnostic hysteroscopy with resectoscopic polypectomy and D&C with benign pathology. He had discussed with her several options for long-term management such as the Mirena IUD due to the fact that the patient has history of von Willebrand's disease and she is a smoker. She also suffers from seizure disorder for which she is on Depakote, Topamax and also has asthma which she takes Maxair on a when necessary basis she denied any dysuria frequency denied any fever chills nausea or vomiting. Patient is overweight.  Exam: Pelvic: Bartholin urethra Skene glands within normal limits Vagina: Slight brown discharge as 4/old blood Cervix: No lesions or discharge Uterus: Anteverted upper limits of normal limited exam due to patient's abdominal girth no rebound or guarding and no large adnexal masses on examination although somewhat limited. Rectal exam: Deferred  Urinalysis demonstrated 1-3 WBC, 5-7 RBC 1+ bacteria sample will be submitted for urine culture  Ultrasound uterus measures 6.2 x 4.2 x 3.6 cm with endometrial stripe of 2.5 mm. 2 small fibroids were noted otherwise ovaries appeared to be normal.  Patient's history and symptomatology may be attributed to underlying adenomyosis or form of endometriosis. Dr. Reynold Bowen recommendation for  Mirena IUD  were discussed with the patient but she has concerns because of von Willebrand's disease. She'll be given a prescription of Ultram 50 mg to take one by mouth every 6 hours when necessary. Will await the results of the urine culture and notify you if she needs to be prescribed any antibiotic. She will followup with Dr. Audie Box next one to 2 weeks to discuss treatment options that he had previously discussed with her.

## 2010-11-14 ENCOUNTER — Telehealth: Payer: Self-pay | Admitting: *Deleted

## 2010-11-14 DIAGNOSIS — N390 Urinary tract infection, site not specified: Secondary | ICD-10-CM

## 2010-11-14 DIAGNOSIS — R102 Pelvic and perineal pain: Secondary | ICD-10-CM

## 2010-11-14 MED ORDER — HYDROCODONE-ACETAMINOPHEN 5-500 MG PO TABS: 1.0000 | ORAL_TABLET | Freq: Four times a day (QID) | ORAL | Status: DC | PRN

## 2010-11-14 MED ORDER — NITROFURANTOIN MONOHYD MACRO 100 MG PO CAPS: 100.0000 mg | ORAL_CAPSULE | Freq: Two times a day (BID) | ORAL | Status: AC

## 2010-11-14 NOTE — Telephone Encounter (Signed)
Message copied by Aura Camps on Mon Nov 14, 2010  9:40 AM ------      Message from: Ok Edwards      Created: Mon Nov 14, 2010  9:01 AM        Victorino Dike, please call in a prescription for Macrobid 100 mg tablet she is to take one tablet twice a day for 7 days. Tell her her urine culture was indicative of a urinary tract infection.

## 2010-11-14 NOTE — Telephone Encounter (Signed)
Pt informed with the below note. 

## 2010-11-14 NOTE — Telephone Encounter (Signed)
Lm for pt to call regarding the below note. 

## 2010-11-14 NOTE — Telephone Encounter (Signed)
Pt was seen on 11/11/10 by Dr. Glenetta Hew and was given tramadol. Pt states she had allergic reaction to this medication. She had extreme itching she spoke with the pharmacist and was told to take benadryl. Pt wants to know could she have something for pain for cramping? Please advise

## 2010-11-24 ENCOUNTER — Emergency Department (HOSPITAL_COMMUNITY): Payer: PRIVATE HEALTH INSURANCE

## 2010-11-24 ENCOUNTER — Emergency Department (HOSPITAL_COMMUNITY)
Admission: EM | Admit: 2010-11-24 | Discharge: 2010-11-24 | Disposition: A | Discharge status: Home / Self Care | Payer: PRIVATE HEALTH INSURANCE | Attending: Emergency Medicine | Admitting: Emergency Medicine

## 2010-11-24 DIAGNOSIS — S300XXA Contusion of lower back and pelvis, initial encounter: Secondary | ICD-10-CM | POA: Insufficient documentation

## 2010-11-24 DIAGNOSIS — M533 Sacrococcygeal disorders, not elsewhere classified: Secondary | ICD-10-CM | POA: Insufficient documentation

## 2010-11-24 DIAGNOSIS — R296 Repeated falls: Secondary | ICD-10-CM | POA: Insufficient documentation

## 2010-11-24 DIAGNOSIS — G40909 Epilepsy, unspecified, not intractable, without status epilepticus: Secondary | ICD-10-CM | POA: Insufficient documentation

## 2010-11-28 LAB — URINE MICROSCOPIC-ADD ON

## 2010-11-28 LAB — URINALYSIS, ROUTINE W REFLEX MICROSCOPIC
Bilirubin Urine: NEGATIVE
Glucose, UA: NEGATIVE
Hgb urine dipstick: NEGATIVE
Ketones, ur: 15 — AB
Protein, ur: 30 — AB

## 2010-11-28 LAB — GLUCOSE, CAPILLARY: Glucose-Capillary: 103 — ABNORMAL HIGH

## 2010-12-01 ENCOUNTER — Encounter: Payer: Self-pay | Admitting: Gynecology

## 2011-01-28 ENCOUNTER — Encounter (HOSPITAL_COMMUNITY): Payer: Self-pay | Admitting: *Deleted

## 2011-01-28 ENCOUNTER — Emergency Department (HOSPITAL_COMMUNITY)
Admission: EM | Admit: 2011-01-28 | Discharge: 2011-01-28 | Disposition: A | Discharge status: Home / Self Care | Payer: PRIVATE HEALTH INSURANCE | Attending: Emergency Medicine | Admitting: Emergency Medicine

## 2011-01-28 DIAGNOSIS — R6884 Jaw pain: Secondary | ICD-10-CM | POA: Insufficient documentation

## 2011-01-28 DIAGNOSIS — M26629 Arthralgia of temporomandibular joint, unspecified side: Secondary | ICD-10-CM

## 2011-01-28 DIAGNOSIS — M2669 Other specified disorders of temporomandibular joint: Secondary | ICD-10-CM | POA: Insufficient documentation

## 2011-01-28 DIAGNOSIS — F17200 Nicotine dependence, unspecified, uncomplicated: Secondary | ICD-10-CM | POA: Insufficient documentation

## 2011-01-28 MED ORDER — OXYCODONE-ACETAMINOPHEN 5-325 MG PO TABS: 2.0000 | ORAL_TABLET | ORAL | Status: AC | PRN

## 2011-01-28 NOTE — ED Notes (Signed)
Pain in bil jaws from popping and coming out of place, difficult to eat at present, also has headache from the pain

## 2011-01-28 NOTE — ED Provider Notes (Signed)
History     CSN: [id number] Arrival date & time: 01/28/2011  7:59 AM   First MD Initiated Contact with Patient 01/28/11 0845      Chief Complaint  Patient presents with  . Jaw Pain    (Consider location/radiation/quality/duration/timing/severity/associated sxs/prior treatment) HPI  The patient is a 35 year old female, who complains of recurrent lancinating, jaw pain.  This occurs when she speaking eating or yawning.  She has not dislocated her jaw, but the pain occurs frequently.  She spoken with her dentist about this in the past and she has tried the use of bite block, but she had not got relief of her symptoms.  Her dentist suggested that she sees an Transport planner.  However, she has not done so at this time.  She has no other symptoms.  Past Medical History  Diagnosis Date  . Seizure disorder   . Von Willebrand disease   . Asthma   . Smoker   . Cervical dysplasia, mild 08/2010    LGSIL  . Seizures     seizure disorder  . Blood dyscrasia     Von Willebrands  . Anxiety   . Arthritis     lower back  . Headache     Past Surgical History  Procedure Date  . Tonsillectomy 2003    [place]  . Nasal sinus surgery 1993    IN [place], [place]  . Wisdom tooth extraction 1993  . Tonsillectomy   . Dilation and curettage of uterus 10/14/2010    polyp  . Hysteroscopy 8.17.12  . Hysteroscopy w/d&c 10/14/2010    Procedure: DILATATION AND CURETTAGE (D&C) /HYSTEROSCOPY;  Surgeon: Dara Lords, MD;  Location: [facility];  Service: Gynecology;  Laterality: N/A;    Family History  Problem Relation Age of Onset  . Hypertension Mother   . Diabetes Father   . Hypertension Father   . Heart disease Father   . Lymphoma Sister     NON-HODGKINS LYMPHOMA    History  Substance Use Topics  . Smoking status: Current Everyday Smoker -- 0.5 packs/day    Types: Cigarettes  . Smokeless tobacco: Never Used  . Alcohol Use: No    OB History    Grav Para Term Preterm Abortions TAB SAB  Ect Mult Living   0               Review of Systems  Constitutional: Negative for fever and chills.  HENT:       Bilateral jaw pain  Neurological: Negative for headaches.  Psychiatric/Behavioral: Negative for confusion.    Allergies  Aspirin; Codeine; Erythromycin; and Penicillins  Home Medications   Current Outpatient Rx  Name Route Sig Dispense Refill  . AMITRIPTYLINE HCL 25 MG PO TABS Oral Take 25 mg by mouth daily as needed. For sleep    . MULTIVITAMINS PO CAPS Oral Take 1 capsule by mouth daily.      [name] PIRBUTEROL ACETATE 200 MCG/INH IN AERB Inhalation Inhale 2 puffs into the lungs as needed. For asthma    . ZONISAMIDE 100 MG PO CAPS Oral Take 100 mg by mouth at bedtime.        BP 166/102  Pulse 97  Temp 97.7 F (36.5 C)  SpO2 97%  Physical Exam  Constitutional: She is oriented to person, place, and time. She appears well-developed and well-nourished.  HENT:  Head: Normocephalic and atraumatic.       Tenderness over her bilateral temporomandibular joints with increased pain on opening  and lateral movement  Eyes: Pupils are equal, round, and reactive to light.  Neck: Normal range of motion.  Pulmonary/Chest: Effort normal and breath sounds normal.  Musculoskeletal: Normal range of motion.  Neurological: She is alert and oriented to person, place, and time. No cranial nerve deficit.  Skin: Skin is warm and dry.  Psychiatric: She has a normal mood and affect. Her behavior is normal.    ED Course  Procedures (including critical care time)  Labs Reviewed - No data to display No results found.   No diagnosis found.    MDM  Suspected TMJ syndrome        Nicholes Stairs, MD 01/28/11 [phone]

## 2011-02-07 ENCOUNTER — Emergency Department (HOSPITAL_COMMUNITY)
Admission: EM | Admit: 2011-02-07 | Discharge: 2011-02-07 | Discharge status: Against Medical Advice | Payer: PRIVATE HEALTH INSURANCE | Attending: Emergency Medicine | Admitting: Emergency Medicine

## 2011-02-07 ENCOUNTER — Encounter (HOSPITAL_COMMUNITY): Payer: Self-pay | Admitting: Emergency Medicine

## 2011-02-07 DIAGNOSIS — Z79899 Other long term (current) drug therapy: Secondary | ICD-10-CM | POA: Insufficient documentation

## 2011-02-07 DIAGNOSIS — D6800 Von Willebrand disease, unspecified: Secondary | ICD-10-CM | POA: Insufficient documentation

## 2011-02-07 DIAGNOSIS — H538 Other visual disturbances: Secondary | ICD-10-CM

## 2011-02-07 DIAGNOSIS — R209 Unspecified disturbances of skin sensation: Secondary | ICD-10-CM | POA: Insufficient documentation

## 2011-02-07 DIAGNOSIS — J45909 Unspecified asthma, uncomplicated: Secondary | ICD-10-CM | POA: Insufficient documentation

## 2011-02-07 DIAGNOSIS — M129 Arthropathy, unspecified: Secondary | ICD-10-CM | POA: Insufficient documentation

## 2011-02-07 DIAGNOSIS — G40909 Epilepsy, unspecified, not intractable, without status epilepticus: Secondary | ICD-10-CM | POA: Insufficient documentation

## 2011-02-07 DIAGNOSIS — R200 Anesthesia of skin: Secondary | ICD-10-CM

## 2011-02-07 LAB — DIFFERENTIAL
Basophils Relative: 0 % (ref 0–1)
Eosinophils Absolute: 0.2 10*3/uL (ref 0.0–0.7)
Eosinophils Relative: 2 % (ref 0–5)
Monocytes Absolute: 0.6 10*3/uL (ref 0.1–1.0)
Monocytes Relative: 5 % (ref 3–12)
Neutrophils Relative %: 43 % (ref 43–77)

## 2011-02-07 LAB — BASIC METABOLIC PANEL
BUN: 13 mg/dL (ref 6–23)
GFR calc non Af Amer: >90 mL/min (ref >90)
Glucose, Bld: 118 mg/dL — ABNORMAL HIGH (ref 70–99)
Potassium: 3.7 mEq/L (ref 3.5–5.1)

## 2011-02-07 LAB — CBC
HCT: 41.1 % (ref 36.0–46.0)
Hemoglobin: 14.1 g/dL (ref 12.0–15.0)
MCH: 30.9 pg (ref 26.0–34.0)
MCHC: 34.3 g/dL (ref 30.0–36.0)

## 2011-02-07 MED ORDER — TETRACAINE HCL 0.5 % OP SOLN: 1.0000 [drp] | Freq: Once | OPHTHALMIC | Status: DC

## 2011-02-07 MED ORDER — FLUORESCEIN SODIUM 1 MG OP STRP
ORAL_STRIP | OPHTHALMIC | Status: AC
  Filled 2011-02-07: qty 1

## 2011-02-07 MED ORDER — TETRACAINE HCL 0.5 % OP SOLN
OPHTHALMIC | Status: AC
  Filled 2011-02-07: qty 2

## 2011-02-07 NOTE — ED Notes (Signed)
PT. REPORTS NUMBNESS AT RIGHT LEG/ RIGHT ARM AND RIGHT FACE X 2 DAYS / GENERALIZED BODY WEAKNESS X 2 DAYS .

## 2011-02-07 NOTE — ED Notes (Signed)
Pt left ama. Was told the risks of refusing further treatment and pt stated she was tired of waiting. NAD noted at this time.

## 2011-02-07 NOTE — ED Provider Notes (Signed)
History     CSN: [id number] Arrival date & time: 02/07/2011  1:47 AM   First MD Initiated Contact with Patient 02/07/11 [phone]      Chief Complaint  Patient presents with  . Numbness    (Consider location/radiation/quality/duration/timing/severity/associated sxs/prior treatment) HPI Comments: 35 year old female with a history of migraine headaches, anxiety, seizure disorder who presents with a complaint of right high swelling, blurred vision, right jaw numbness, right upper and lower extremity tingling and prickles sensation. Symptoms are constant over the last 2 days, nothing makes better or worse, not associated with fevers chills nausea vomiting abdominal pain back pain coughing or shortness of breath. She does have a history of asthma.  Symptoms are mild, persistent  The history is provided by the patient and medical records.    Past Medical History  Diagnosis Date  . Seizure disorder   . Von Willebrand disease   . Asthma   . Smoker   . Cervical dysplasia, mild 08/2010    LGSIL  . Seizures     seizure disorder  . Blood dyscrasia     Von Willebrands  . Anxiety   . Arthritis     lower back  . Headache     Past Surgical History  Procedure Date  . Tonsillectomy 2003    [place]  . Nasal sinus surgery 1993    IN [place], [place]  . Wisdom tooth extraction 1993  . Tonsillectomy   . Dilation and curettage of uterus 10/14/2010    polyp  . Hysteroscopy 8.17.12  . Hysteroscopy w/d&c 10/14/2010    Procedure: DILATATION AND CURETTAGE (D&C) /HYSTEROSCOPY;  Surgeon: Dara Lords, MD;  Location: [facility];  Service: Gynecology;  Laterality: N/A;    Family History  Problem Relation Age of Onset  . Hypertension Mother   . Diabetes Father   . Hypertension Father   . Heart disease Father   . Lymphoma Sister     NON-HODGKINS LYMPHOMA    History  Substance Use Topics  . Smoking status: Current Everyday Smoker -- 0.5 packs/day    Types: Cigarettes  . Smokeless  tobacco: Never Used  . Alcohol Use: No    OB History    Grav Para Term Preterm Abortions TAB SAB Ect Mult Living   0               Review of Systems  All other systems reviewed and are negative.    Allergies  Aspirin; Codeine; Erythromycin; and Penicillins  Home Medications   Current Outpatient Rx  Name Route Sig Dispense Refill  . AMITRIPTYLINE HCL 25 MG PO TABS Oral Take 25 mg by mouth daily as needed. For sleep    . MULTIVITAMINS PO CAPS Oral Take 1 capsule by mouth daily.      . OXYCODONE-ACETAMINOPHEN 5-325 MG PO TABS Oral Take 2 tablets by mouth every 4 (four) hours as needed for pain. 15 tablet 0  . PIRBUTEROL ACETATE 200 MCG/INH IN AERB Inhalation Inhale 2 puffs into the lungs as needed. For asthma    . ZONISAMIDE 100 MG PO CAPS Oral Take 100 mg by mouth at bedtime.        BP 119/69  Pulse 104  Temp(Src) 98.2 F (36.8 C) (Oral)  Resp 13  SpO2 100%  LMP 01/31/2011  Physical Exam  Nursing note and vitals reviewed. Constitutional: She appears well-developed and well-nourished. No distress.  HENT:  Head: Normocephalic and atraumatic.  Mouth/Throat: Oropharynx is clear and moist. No oropharyngeal  exudate.  Eyes: Conjunctivae and EOM are normal. Pupils are equal, round, and reactive to light. Right eye exhibits no discharge. Left eye exhibits no discharge. No scleral icterus.  Neck: Normal range of motion. Neck supple. No JVD present. No thyromegaly present.  Cardiovascular: Normal rate, regular rhythm, normal heart sounds and intact distal pulses.  Exam reveals no gallop and no friction rub.   No murmur heard. Pulmonary/Chest: Effort normal. No respiratory distress. She has wheezes ( Mild end expiratory wheezing, speaks in full sentences, no respiratory distress). She has no rales.  Abdominal: Soft. Bowel sounds are normal. She exhibits no distension and no mass. There is no tenderness.  Musculoskeletal: Normal range of motion. She exhibits no edema and no  tenderness.  Lymphadenopathy:    She has no cervical adenopathy.  Neurological: She is alert. Coordination normal.       Slight decreased sensation to the right upper and lower extremities to light touch but not pinprick. She has normal strength in the bilateral upper and lower extremities, no facial droop, cranial nerves III through XII intact except for a very small area to the right cheek which has a slightly decreased sensation. Right eye and left eye have normal extraocular movements and pupillary function.  Skin: Skin is warm and dry. No rash noted. No erythema.  Psychiatric: She has a normal mood and affect. Her behavior is normal.    ED Course  Procedures (including critical care time)  Labs Reviewed  BASIC METABOLIC PANEL - Abnormal; Notable for the following:    Glucose, Bld 118 (*)    All other components within normal limits  CBC - Abnormal; Notable for the following:    WBC 11.7 (*)    All other components within normal limits  DIFFERENTIAL - Abnormal; Notable for the following:    Lymphocytes Relative 49 (*)    Lymphs Abs 5.8 (*)    All other components within normal limits   No results found.   1. Numbness   2. Blurry vision       MDM  Check intraocular pressure, visual acuity, potassium level, rule out anemia, possible anxiety, migraine headache type reaction.  Basic metabolic panel normal, CBC normal, prior to checking patient's intraocular pressure she left AGAINST MEDICAL ADVICE citing timeframe.  On my initial evaluation of the patient I told her how important it was to get this information yet she still left without this documented pressure.  Visual acuity was not normal, see nursing documentation for same. Patient encouraged multiple times to stay by nursing staff but refused.  Vida Roller, MD 02/07/11 [phone]

## 2011-02-14 ENCOUNTER — Emergency Department (HOSPITAL_COMMUNITY)
Admission: EM | Admit: 2011-02-14 | Discharge: 2011-02-15 | Disposition: A | Discharge status: Home / Self Care | Payer: PRIVATE HEALTH INSURANCE | Attending: Emergency Medicine | Admitting: Emergency Medicine

## 2011-02-14 ENCOUNTER — Emergency Department (HOSPITAL_COMMUNITY): Payer: PRIVATE HEALTH INSURANCE

## 2011-02-14 ENCOUNTER — Encounter (HOSPITAL_COMMUNITY): Payer: Self-pay | Admitting: *Deleted

## 2011-02-14 ENCOUNTER — Other Ambulatory Visit: Payer: Self-pay

## 2011-02-14 DIAGNOSIS — R0602 Shortness of breath: Secondary | ICD-10-CM | POA: Insufficient documentation

## 2011-02-14 DIAGNOSIS — Z79899 Other long term (current) drug therapy: Secondary | ICD-10-CM | POA: Insufficient documentation

## 2011-02-14 DIAGNOSIS — J45909 Unspecified asthma, uncomplicated: Secondary | ICD-10-CM | POA: Insufficient documentation

## 2011-02-14 DIAGNOSIS — F17200 Nicotine dependence, unspecified, uncomplicated: Secondary | ICD-10-CM | POA: Insufficient documentation

## 2011-02-14 DIAGNOSIS — R059 Cough, unspecified: Secondary | ICD-10-CM | POA: Insufficient documentation

## 2011-02-14 DIAGNOSIS — R05 Cough: Secondary | ICD-10-CM | POA: Insufficient documentation

## 2011-02-14 DIAGNOSIS — D6800 Von Willebrand disease, unspecified: Secondary | ICD-10-CM | POA: Insufficient documentation

## 2011-02-14 DIAGNOSIS — R0789 Other chest pain: Secondary | ICD-10-CM

## 2011-02-14 DIAGNOSIS — M129 Arthropathy, unspecified: Secondary | ICD-10-CM | POA: Insufficient documentation

## 2011-02-14 DIAGNOSIS — R071 Chest pain on breathing: Secondary | ICD-10-CM | POA: Insufficient documentation

## 2011-02-14 DIAGNOSIS — F411 Generalized anxiety disorder: Secondary | ICD-10-CM | POA: Insufficient documentation

## 2011-02-14 DIAGNOSIS — G40909 Epilepsy, unspecified, not intractable, without status epilepticus: Secondary | ICD-10-CM | POA: Insufficient documentation

## 2011-02-14 NOTE — ED Notes (Signed)
Pt states that about 4 days ago, she woke up with pain under her left breast that hurts when she breathes in deep or coughs.  Pt has a hx of asthma.  Pain 7/10.  She states it has spread through to her back since the pain started.  All vitals stable.  No respiratory sx.  NAD.

## 2011-02-14 NOTE — ED Provider Notes (Signed)
History     CSN: [id number] Arrival date & time: 02/14/2011  6:43 PM   First MD Initiated Contact with Patient 02/14/11 2214      Chief Complaint  Patient presents with  . URI    (Consider location/radiation/quality/duration/timing/severity/associated sxs/prior treatment) HPI Patient with history of asthma presenting with complaint of left sided chest wall pain underneath her left breast. She states the pain is worse with coughing and she has had worsening cough over the past few days as well. She also states pain is worse with sneezing. She has not had any fever or chills. She's had mild shortness of breath which is at her baseline due to her asthma. She's had no trauma or falls. She's had no swelling of her legs. She does not take hormone replacement, no history of PE or DVT, no recent surgery or travel. Her cough is not productive. She does continue to smoke cigarettes although has been smoking less to to cough. Pain is worse with movement and palpation.  There no other alleviating or modifying factors. There no other associated systemic symptoms  Past Medical History  Diagnosis Date  . Seizure disorder   . Von Willebrand disease   . Asthma   . Smoker   . Cervical dysplasia, mild 08/2010    LGSIL  . Seizures     seizure disorder  . Blood dyscrasia     Von Willebrands  . Anxiety   . Arthritis     lower back  . Headache     Past Surgical History  Procedure Date  . Tonsillectomy 2003    IN [place]  . Nasal sinus surgery 1993    IN [place], [place]  . Wisdom tooth extraction 1993  . Tonsillectomy   . Dilation and curettage of uterus 10/14/2010    polyp  . Hysteroscopy 8.17.12  . Hysteroscopy w/d&c 10/14/2010    Procedure: DILATATION AND CURETTAGE (D&C) /HYSTEROSCOPY;  Surgeon: Dara Lords, MD;  Location: [facility];  Service: Gynecology;  Laterality: N/A;    Family History  Problem Relation Age of Onset  . Hypertension Mother   . Diabetes Father   . Hypertension  Father   . Heart disease Father   . Lymphoma Sister     NON-HODGKINS LYMPHOMA    History  Substance Use Topics  . Smoking status: Current Everyday Smoker -- 0.5 packs/day    Types: Cigarettes  . Smokeless tobacco: Never Used  . Alcohol Use: No    OB History    Grav Para Term Preterm Abortions TAB SAB Ect Mult Living   0               Review of Systems ROS reviewed and otherwise negative except for mentioned in HPI  Allergies  Aspirin; Codeine; Erythromycin; and Penicillins  Home Medications   Current Outpatient Rx  Name Route Sig Dispense Refill  . AMITRIPTYLINE HCL 25 MG PO TABS Oral Take 25 mg by mouth daily as needed. For sleep    . CYCLOBENZAPRINE HCL 5 MG PO TABS Oral Take 5 mg by mouth 3 (three) times daily as needed. For muscle relaxant.     . ADULT MULTIVITAMIN W/MINERALS CH Oral Take 1 tablet by mouth daily.      . OXYCODONE-ACETAMINOPHEN 5-325 MG PO TABS Oral Take 1 tablet by mouth every 6 (six) hours as needed. For pain.     [name] PIRBUTEROL ACETATE 200 MCG/INH IN AERB Inhalation Inhale 2 puffs into the lungs as needed. For asthma    .  ZONISAMIDE 100 MG PO CAPS Oral Take 100 mg by mouth at bedtime.      . IBUPROFEN 600 MG PO TABS Oral Take 1 tablet (600 mg total) by mouth every 6 (six) hours as needed for pain. 30 tablet 0    BP 141/54  Pulse 102  Temp(Src) 98.1 F (36.7 C) (Oral)  Resp 18  SpO2 98%  LMP 01/31/2011 Vitals reviewed Physical Exam Physical Examination: General appearance - alert, well appearing, and in no distress Mental status - alert, oriented to person, place, and time Mouth - mucous membranes moist, pharynx normal without lesions Chest - clear to auscultation, no wheezes, rales or rhonchi, symmetric air entry, diffusely ttp over left chest wall underneath left breast and mid axillary line, no crepitus or stepoffs Heart - normal rate, regular rhythm, normal S1, S2, no murmurs, rubs, clicks or gallops Abdomen - soft, nontender, nondistended,  no masses or organomegaly Extremities - peripheral pulses normal, no pedal edema, no clubbing or cyanosis Skin - normal coloration and turgor, no rashes, no suspicious skin lesions noted  ED Course  Procedures (including critical care time)  Labs Reviewed - No data to display Dg Chest 2 View  02/14/2011  *RADIOLOGY REPORT*  Clinical Data: Shortness of breath and chest pain.  CHEST - 2 VIEW  Comparison: 07/12/2010  Findings: There is some chronic scarring and atelectasis at the right lung base.  No overt edema, infiltrate or pleural effusion. The heart size is normal.  Bony thorax is unremarkable.  IMPRESSION: No active disease.  Original Report Authenticated By: Reola Calkins, M.D.     1. Chest wall pain       MDM  Patient presenting with reproducible pain in her left chest wall under her left breast and in the midaxillary line. There's no overlying rash to suggest zoster. Chest x-ray was reassuring, no pneumonia or pneumothorax. Pain is worse with cough and deep breathing and movement. I suspect musculoskeletal pain. However patient given incentive spirometer to help prevent development of a pneumonia. She has albuterol that she was encouraged to use every 4 hours as needed for cough.  pt had mild tachycardia, which was improved upon recheck.   She was discharged with strict return precautions and is agreeable with this plan.        Ethelda Chick, MD 02/15/11 [phone]

## 2011-02-14 NOTE — ED Notes (Signed)
Pt in c/o cough and increased shortness of breath over last 3 days, states chest pain when coughing or taking deep breath, denies fever

## 2011-02-15 MED ORDER — IBUPROFEN 600 MG PO TABS: 600.0000 mg | ORAL_TABLET | Freq: Four times a day (QID) | ORAL | Status: AC | PRN

## 2011-03-31 DIAGNOSIS — IMO0001 Reserved for inherently not codable concepts without codable children: Secondary | ICD-10-CM

## 2011-03-31 HISTORY — DX: Reserved for inherently not codable concepts without codable children: IMO0001

## 2011-04-17 ENCOUNTER — Ambulatory Visit: Payer: PRIVATE HEALTH INSURANCE | Admitting: Gynecology

## 2011-04-18 ENCOUNTER — Ambulatory Visit (INDEPENDENT_AMBULATORY_CARE_PROVIDER_SITE_OTHER): Payer: PRIVATE HEALTH INSURANCE | Admitting: Gynecology

## 2011-04-18 ENCOUNTER — Other Ambulatory Visit (HOSPITAL_COMMUNITY)
Admission: RE | Admit: 2011-04-18 | Discharge: 2011-04-18 | Disposition: A | Discharge status: Home / Self Care | Payer: PRIVATE HEALTH INSURANCE | Source: Ambulatory Visit | Attending: Gynecology | Admitting: Gynecology

## 2011-04-18 ENCOUNTER — Encounter: Payer: Self-pay | Admitting: Gynecology

## 2011-04-18 DIAGNOSIS — R102 Pelvic and perineal pain: Secondary | ICD-10-CM

## 2011-04-18 DIAGNOSIS — IMO0002 Reserved for concepts with insufficient information to code with codable children: Secondary | ICD-10-CM

## 2011-04-18 DIAGNOSIS — N946 Dysmenorrhea, unspecified: Secondary | ICD-10-CM

## 2011-04-18 DIAGNOSIS — Z1159 Encounter for screening for other viral diseases: Secondary | ICD-10-CM | POA: Insufficient documentation

## 2011-04-18 DIAGNOSIS — N920 Excessive and frequent menstruation with regular cycle: Secondary | ICD-10-CM

## 2011-04-18 DIAGNOSIS — N949 Unspecified condition associated with female genital organs and menstrual cycle: Secondary | ICD-10-CM

## 2011-04-18 DIAGNOSIS — R87612 Low grade squamous intraepithelial lesion on cytologic smear of cervix (LGSIL): Secondary | ICD-10-CM

## 2011-04-18 DIAGNOSIS — Z01419 Encounter for gynecological examination (general) (routine) without abnormal findings: Secondary | ICD-10-CM | POA: Insufficient documentation

## 2011-04-18 MED ORDER — PROMETHAZINE HCL 50 MG PO TABS: 25.0000 mg | ORAL_TABLET | Freq: Four times a day (QID) | ORAL | Status: DC | PRN

## 2011-04-18 MED ORDER — OXYCODONE-ACETAMINOPHEN 5-325 MG PO TABS: 1.0000 | ORAL_TABLET | ORAL | Status: DC | PRN

## 2011-04-18 NOTE — Progress Notes (Signed)
Patient presents with 2 issues: 1. Six-month Pap smear. Patient has history of low-grade SIL with colposcopy and biopsy in August is here for 6 month follow up Pap smear. 2. Pelvic cramping. Patient has had heavier more painful periods over the last year or so she underwent sonohysterogram and ultimately hysteroscopy D&C for polypoid the endometrial changes. Her periods continue heavy but now she is having a lot more cramping over the last several months occurring in between her periods. No significant dyspareunia diarrhea constipation or other symptoms.  Exam with Sherrilyn Rist chaperone present Abdomen soft nontender without masses guarding rebound organomegaly Pelvic external BUS vagina normal. Cervix normal Pap done. Uterus normal size midline mobile nontender. Adnexa without masses or tenderness.  Assessment and plan: 1. History low-grade SIL. Pap done. If normal or low-grade plan follow up in 6 months with Pap smear. 2. Pelvic pain. Discussed possibilities to include endometriosis. We discussed options in the past to include hormonal manipulation Mirena IUD.  She does have a seizure disorder and von Willebrand's disease and she's unsure about whether her and her husband everyone have children being nulliparous. I think given her total picture next up would be laparoscopy rule out endometriosis although possibility for hysterectomy if they are completely convinced against childbearing. Patient will follow up for ultrasound rule out nonpalpable abnormalities and we'll further discuss her options.

## 2011-04-18 NOTE — Patient Instructions (Signed)
Follow up for ultrasound as scheduled 

## 2011-04-20 ENCOUNTER — Ambulatory Visit (INDEPENDENT_AMBULATORY_CARE_PROVIDER_SITE_OTHER): Payer: PRIVATE HEALTH INSURANCE | Admitting: Gynecology

## 2011-04-20 ENCOUNTER — Ambulatory Visit (INDEPENDENT_AMBULATORY_CARE_PROVIDER_SITE_OTHER): Payer: PRIVATE HEALTH INSURANCE

## 2011-04-20 ENCOUNTER — Encounter: Payer: Self-pay | Admitting: Gynecology

## 2011-04-20 DIAGNOSIS — N946 Dysmenorrhea, unspecified: Secondary | ICD-10-CM

## 2011-04-20 DIAGNOSIS — N920 Excessive and frequent menstruation with regular cycle: Secondary | ICD-10-CM

## 2011-04-20 DIAGNOSIS — R102 Pelvic and perineal pain: Secondary | ICD-10-CM

## 2011-04-20 DIAGNOSIS — D259 Leiomyoma of uterus, unspecified: Secondary | ICD-10-CM

## 2011-04-20 DIAGNOSIS — D251 Intramural leiomyoma of uterus: Secondary | ICD-10-CM

## 2011-04-20 DIAGNOSIS — N949 Unspecified condition associated with female genital organs and menstrual cycle: Secondary | ICD-10-CM

## 2011-04-20 NOTE — Patient Instructions (Signed)
Office will call you to arrange surgery. 

## 2011-04-20 NOTE — Progress Notes (Signed)
Patient presents for ultrasound due to her pelvic pain. Ultrasound shows myoma at 25 mm with some central cystic changes. Right left ovaries visualized and normal cul-de-sac negative. In comparison to ultrasound in June 2012 she had the same cystic changes. Reviewed with her again the situation and she wants to proceed with laparoscopy. I think this is totally reasonable as endometriosis is high in the differential. I reviewed what's involved with laparoscopy to include multiple port sites insufflation trocar placement use of sharp blunt dissection electrocautery laser harmonic scalpel. Possible pathology results to include endometriosis, adhesive disease or no pathology found was discussed. She understands her pain may persist worsen or change following the procedure and no guarantees for pain relief were made. The general risks were reviewed to include infection prolonged antibiotics incisional complications requiring opening and draining of incisions closure by secondary intention long-term issues of cosmetic such as keloid formation and hernia formation. The risks of hemorrhage necessitating transfusion particularly with her von Willebrand's disease necessitating transfusion and the risks of transfusion reaction hepatitis HIV mad cow disease and other unknown entities. The risk of inadvertent injury to internal organs either immediately recognized or delay recognized including bowel bladder ureters vessels and nerves necessitating major exploratory reparative surgeries and future reparative surgeries including bowel resection bladder repair ureteral damage repair ostomy formation was all discussed understood and accepted. Patient wants me to schedule this as soon as possible we will do so and she will come back for a preop exam and consult.

## 2011-04-21 ENCOUNTER — Encounter (HOSPITAL_COMMUNITY): Payer: Self-pay | Admitting: Pharmacist

## 2011-04-21 ENCOUNTER — Telehealth: Payer: Self-pay

## 2011-04-21 MED ORDER — TRAMADOL HCL 50 MG PO TABS
50.0000 mg | ORAL_TABLET | Freq: Four times a day (QID) | ORAL | Status: AC | PRN
Start: 2011-04-21 — End: 2012-04-20

## 2011-04-21 NOTE — Telephone Encounter (Signed)
Patient informed surgery scheduled for next Thursday 2/28 at noon at [facility].  She will come Tuesday and see Dr. Velvet Bathe for consult and she has appt with Dr. Beaulah Dinning, regarding her asthma, on Weds..  She will expect a call from [facility] with instructions.  She said she believes she has taken Ultram before with no problems and she would like to try that. I escribed it to her pharmacy.

## 2011-04-21 NOTE — Telephone Encounter (Signed)
We could try ultram 50 mg #30 one PO Q6 hours.  Ask her if she has tried this  And if not, you can put in for it.

## 2011-04-21 NOTE — Telephone Encounter (Signed)
I called patient to let her know I was working on getting her surgery scheduled and hopefully in the next two weeks. I have submitted times to the OR via EPIC and am waiting for response.  In the meantime I wanted to let her know about her financial responsibiilty to [facility] because she does have big deductible. She said this was not a problem. I told her I will still mail financial letter but with date hopefully being soon and amount due being large I wanted to give her a heads up now.  I, also, told her Dr. Velvet Bathe wants her to see "her asthma doctor" before surgery to make sure all is well for surgery. I encouraged her to call right away and let them know we are working on scheduling surgery for her possibly for next week and to see how quickly they can get her in.  She knows I will call her this afternoon as soon as I have OR time confirmed.  Patient c/o the Oxycodone Dr. Velvet Bathe prescribed for her making her extremely nauseated, itching all over and terrible headache after taking.  She has stopped taking it and wonders is there anything else he can prescribe.  (*Note drug allergies*)

## 2011-04-24 ENCOUNTER — Encounter (HOSPITAL_COMMUNITY)
Admission: RE | Admit: 2011-04-24 | Discharge: 2011-04-24 | Disposition: A | Discharge status: Home / Self Care | Payer: PRIVATE HEALTH INSURANCE | Source: Ambulatory Visit | Attending: Gynecology | Admitting: Gynecology

## 2011-04-24 ENCOUNTER — Encounter: Payer: Self-pay | Admitting: Gynecology

## 2011-04-24 HISTORY — DX: Abnormal cytological findings in specimens from other organs, systems and tissues: R89.6

## 2011-04-24 LAB — CBC
HCT: 40.3 % (ref 36.0–46.0)
MCV: 92.4 fL (ref 78.0–100.0)
Platelets: 383 10*3/uL (ref 150–400)
RBC: 4.36 MIL/uL (ref 3.87–5.11)
RDW: 14.7 % (ref 11.5–15.5)
WBC: 13.9 10*3/uL — ABNORMAL HIGH (ref 4.0–10.5)

## 2011-04-24 LAB — COMPREHENSIVE METABOLIC PANEL
AST: 13 U/L (ref 0–37)
Albumin: 3.4 g/dL — ABNORMAL LOW (ref 3.5–5.2)
Alkaline Phosphatase: 124 U/L — ABNORMAL HIGH (ref 39–117)
CO2: 27 mEq/L (ref 19–32)
Chloride: 100 mEq/L (ref 96–112)
Creatinine, Ser: 0.59 mg/dL (ref 0.50–1.10)
GFR calc non Af Amer: >90 mL/min (ref >90)
Potassium: 3.7 mEq/L (ref 3.5–5.1)
Total Bilirubin: 0.2 mg/dL — ABNORMAL LOW (ref 0.3–1.2)

## 2011-04-24 LAB — SURGICAL PCR SCREEN: Staphylococcus aureus: POSITIVE — AB

## 2011-04-24 NOTE — Pre-Procedure Instructions (Signed)
Pt denies seizure activity (x54yrs). States no change in health history since [facility] in 2012.

## 2011-04-24 NOTE — Patient Instructions (Addendum)
20 Cassidy Tran  04/24/2011   Your procedure is scheduled on:  04/27/11  Enter through the Main Entrance of [facility] at 10 AM.  Pick up the phone at the desk and dial [phone].   Call this number if you have problems the morning of surgery: [phone]   Remember:   Do not eat food:After Midnight.  Do not drink clear liquids: After Midnight.  Take these medicines the morning of surgery with A SIP OF WATER: NA   Do not wear jewelry, make-up or nail polish.  Do not wear lotions, powders, or perfumes. You may wear deodorant.  Do not shave 48 hours prior to surgery.  Do not bring valuables to the hospital.  Contacts, dentures or bridgework may not be worn into surgery.  Leave suitcase in the car. After surgery it may be brought to your room.  For patients admitted to the hospital, checkout time is 11:00 AM the day of discharge.   Patients discharged the day of surgery will not be allowed to drive home.  Name and phone number of your driver: NA  Special Instructions: CHG Shower Use Special Wash: 1/2 bottle night before surgery and 1/2 bottle morning of surgery.   Please read over the following fact sheets that you were given: MRSA Information

## 2011-04-25 ENCOUNTER — Encounter: Payer: Self-pay | Admitting: Gynecology

## 2011-04-25 ENCOUNTER — Ambulatory Visit (INDEPENDENT_AMBULATORY_CARE_PROVIDER_SITE_OTHER): Payer: PRIVATE HEALTH INSURANCE | Admitting: Gynecology

## 2011-04-25 DIAGNOSIS — N946 Dysmenorrhea, unspecified: Secondary | ICD-10-CM

## 2011-04-25 DIAGNOSIS — N949 Unspecified condition associated with female genital organs and menstrual cycle: Secondary | ICD-10-CM

## 2011-04-25 DIAGNOSIS — R102 Pelvic and perineal pain: Secondary | ICD-10-CM

## 2011-04-25 NOTE — Patient Instructions (Signed)
Followup for surgery as scheduled. 

## 2011-04-25 NOTE — H&P (Signed)
  Cassidy Tran [birth year] [id number]   History and Physical    Chief complaint: pelvic cramping/dysmenorrhea  History of present illness: 36 y.o.  G0 with one year history of heavier more crampy periods. Underwent sonohysterogram and ultimately hysteroscopy D&C for polypoid endometrium in August 2012. Her cramping has continued and now she's developing intermenstrual cramping and pain she is scheduled for diagnostic laparoscopy, rule out endometriosis   Past medical history,surgical history, medications, allergies, family history and social history were all reviewed and documented in the EPIC chart. ROS:  Was performed and pertinent positives and negatives are included in the history of present illness.  Exam: Afebrile vital signs are stable  General: well developed, well nourished female, no acute distress HEENT: normal  Lungs: clear to auscultation without wheezing, rales or rhonchi  Cardiac: regular rate without rubs, murmurs or gallops  Abdomen: soft, nontender without masses, guarding, rebound, organomegaly  April 18, 2011 Pelvic: external bus vagina: normal   Cervix: grossly normal  Uterus: normal size, midline and mobile, nontender  Adnexa: without masses or tenderness     Assessment/Plan:  36 year old G0 with worsening dysmenorrhea, intermenstrual cramping for diagnostic laparoscopy.  I reviewed what's involved with laparoscopy to include multiple port sites insufflation trocar placement use of sharp blunt dissection electrocautery laser harmonic scalpel. Possible pathology results to include endometriosis, adhesive disease or no pathology found was discussed. She understands her pain may persist worsen or change following the procedure and no guarantees for pain relief were made. The general risks were reviewed to include infection prolonged antibiotics incisional complications requiring opening and draining of incisions closure by secondary intention long-term issues of  cosmetic such as keloid formation and hernia formation. The risks of hemorrhage necessitating transfusion particularly with her von Willebrand's disease necessitating transfusion and the risks of transfusion reaction hepatitis HIV mad cow disease and other unknown entities. The risk of inadvertent injury to internal organs either immediately recognized or delay recognized including bowel bladder ureters vessels and nerves necessitating major exploratory reparative surgeries and future reparative surgeries including bowel resection bladder repair ureteral damage repair ostomy formation was all discussed understood and accepted.  Patient's questions were answered and she is ready to proceed with surgery. She will be treated preoperatively and postoperatively with DDAVP.  She also has a history of asthma and has a preoperative appointment with her allergist.   Dara Lords MD, 5:15 PM 04/25/2011

## 2011-04-25 NOTE — Progress Notes (Signed)
Cassidy Tran 09-17-1975 [id number]   Preop consult    Chief complaint: pelvic cramping/dysmenorrhea  History of present illness: 36 y.o.  G0 with one year history of heavier more crampy periods. Underwent sonohysterogram and ultimately hysteroscopy D&C for polypoid endometrium in August 2012. Her cramping has continued and now she's developing intermenstrual cramping and pain she is scheduled for diagnostic laparoscopy, rule out endometriosis   Past medical history,surgical history, medications, allergies, family history and social history were all reviewed and documented in the EPIC chart. ROS:  Was performed and pertinent positives and negatives are included in the history of present illness.  Exam: Afebrile vital signs are stable  General: well developed, well nourished female, no acute distress HEENT: normal  Lungs: clear to auscultation without wheezing, rales or rhonchi  Cardiac: regular rate without rubs, murmurs or gallops  Abdomen: soft, nontender without masses, guarding, rebound, organomegaly  April 18, 2011 Pelvic: external bus vagina: normal   Cervix: grossly normal  Uterus: normal size, midline and mobile, nontender  Adnexa: without masses or tenderness     Assessment/Plan:  36 year old G0 with worsening dysmenorrhea, intermenstrual cramping for diagnostic laparoscopy.  I reviewed what's involved with laparoscopy to include multiple port sites insufflation trocar placement use of sharp blunt dissection electrocautery laser harmonic scalpel. Possible pathology results to include endometriosis, adhesive disease or no pathology found was discussed. She understands her pain may persist worsen or change following the procedure and no guarantees for pain relief were made. The general risks were reviewed to include infection prolonged antibiotics incisional complications requiring opening and draining of incisions closure by secondary intention long-term issues of cosmetic  such as keloid formation and hernia formation. The risks of hemorrhage necessitating transfusion particularly with her von Willebrand's disease necessitating transfusion and the risks of transfusion reaction hepatitis HIV mad cow disease and other unknown entities. The risk of inadvertent injury to internal organs either immediately recognized or delay recognized including bowel bladder ureters vessels and nerves necessitating major exploratory reparative surgeries and future reparative surgeries including bowel resection bladder repair ureteral damage repair ostomy formation was all discussed understood and accepted.  Patient's questions were answered and she is ready to proceed with surgery. She will be treated preoperatively and postoperatively with DDAVP.  She also has a history of asthma and has a preoperative appointment with her allergist.     Dara Lords MD, 5:08 PM 04/25/2011

## 2011-04-26 MED ORDER — CLINDAMYCIN PHOSPHATE 900 MG/50ML IV SOLN
900.0000 mg | INTRAVENOUS | Status: DC
  Filled 2011-04-26: qty 50

## 2011-04-26 MED ORDER — SODIUM CHLORIDE 0.9 % IV SOLN
0.3000 ug/kg | Freq: Once | INTRAVENOUS | Status: AC
  Administered 2011-04-27: 32.8 ug via INTRAVENOUS
  Filled 2011-04-26: qty 8.2

## 2011-04-26 MED ORDER — CIPROFLOXACIN IN D5W 400 MG/200ML IV SOLN
400.0000 mg | INTRAVENOUS | Status: DC
  Filled 2011-04-26: qty 200

## 2011-04-27 ENCOUNTER — Encounter (HOSPITAL_COMMUNITY): Payer: Self-pay | Admitting: Anesthesiology

## 2011-04-27 ENCOUNTER — Ambulatory Visit (HOSPITAL_COMMUNITY): Payer: PRIVATE HEALTH INSURANCE | Admitting: Anesthesiology

## 2011-04-27 ENCOUNTER — Encounter (HOSPITAL_COMMUNITY)
Admission: RE | Disposition: A | Discharge status: Home / Self Care | Payer: Self-pay | Source: Ambulatory Visit | Attending: Gynecology

## 2011-04-27 ENCOUNTER — Ambulatory Visit (HOSPITAL_COMMUNITY)
Admission: RE | Admit: 2011-04-27 | Discharge: 2011-04-27 | Disposition: A | Discharge status: Home / Self Care | Payer: PRIVATE HEALTH INSURANCE | Source: Ambulatory Visit | Attending: Gynecology | Admitting: Gynecology

## 2011-04-27 DIAGNOSIS — N8300 Follicular cyst of ovary, unspecified side: Secondary | ICD-10-CM | POA: Insufficient documentation

## 2011-04-27 DIAGNOSIS — Z01812 Encounter for preprocedural laboratory examination: Secondary | ICD-10-CM | POA: Insufficient documentation

## 2011-04-27 DIAGNOSIS — N83209 Unspecified ovarian cyst, unspecified side: Secondary | ICD-10-CM

## 2011-04-27 DIAGNOSIS — Z9889 Other specified postprocedural states: Secondary | ICD-10-CM

## 2011-04-27 DIAGNOSIS — N736 Female pelvic peritoneal adhesions (postinfective): Secondary | ICD-10-CM

## 2011-04-27 DIAGNOSIS — Z01818 Encounter for other preprocedural examination: Secondary | ICD-10-CM | POA: Insufficient documentation

## 2011-04-27 DIAGNOSIS — N949 Unspecified condition associated with female genital organs and menstrual cycle: Secondary | ICD-10-CM

## 2011-04-27 DIAGNOSIS — N803 Endometriosis of pelvic peritoneum, unspecified: Secondary | ICD-10-CM | POA: Insufficient documentation

## 2011-04-27 DIAGNOSIS — N946 Dysmenorrhea, unspecified: Secondary | ICD-10-CM | POA: Insufficient documentation

## 2011-04-27 HISTORY — PX: LAPAROSCOPY: SHX197

## 2011-04-27 SURGERY — LAPAROSCOPY OPERATIVE
Anesthesia: General | Site: Abdomen | Wound class: Clean Contaminated

## 2011-04-27 MED ORDER — ROCURONIUM BROMIDE 50 MG/5ML IV SOLN
INTRAVENOUS | Status: AC
  Filled 2011-04-27: qty 1

## 2011-04-27 MED ORDER — BUPIVACAINE HCL (PF) 0.25 % IJ SOLN
INTRAMUSCULAR | Status: DC | PRN
  Administered 2011-04-27: 30 mL

## 2011-04-27 MED ORDER — ONDANSETRON HCL 4 MG/2ML IJ SOLN
INTRAMUSCULAR | Status: DC | PRN
  Administered 2011-04-27: 4 mg via INTRAVENOUS

## 2011-04-27 MED ORDER — ACETAMINOPHEN 325 MG PO TABS: 325.0000 mg | ORAL_TABLET | ORAL | Status: DC | PRN

## 2011-04-27 MED ORDER — OXYCODONE-ACETAMINOPHEN 7.5-325 MG PO TABS: 1.0000 | ORAL_TABLET | ORAL | Status: AC | PRN

## 2011-04-27 MED ORDER — ALBUTEROL SULFATE (5 MG/ML) 0.5% IN NEBU
2.5000 mg | INHALATION_SOLUTION | Freq: Once | RESPIRATORY_TRACT | Status: AC
  Administered 2011-04-27: 2.5 mg via RESPIRATORY_TRACT

## 2011-04-27 MED ORDER — MIDAZOLAM HCL 5 MG/5ML IJ SOLN
INTRAMUSCULAR | Status: DC | PRN
  Administered 2011-04-27: 2 mg via INTRAVENOUS

## 2011-04-27 MED ORDER — DEXTROSE-NACL 5-0.9 % IV SOLN: INTRAVENOUS | Status: DC

## 2011-04-27 MED ORDER — DEXAMETHASONE SODIUM PHOSPHATE 4 MG/ML IJ SOLN
INTRAMUSCULAR | Status: DC | PRN
  Administered 2011-04-27: 10 mg via INTRAVENOUS

## 2011-04-27 MED ORDER — METHYLENE BLUE 1 % INJ SOLN
INTRAMUSCULAR | Status: AC
  Filled 2011-04-27: qty 1

## 2011-04-27 MED ORDER — HYDROCODONE-ACETAMINOPHEN 5-325 MG PO TABS: 1.0000 | ORAL_TABLET | Freq: Once | ORAL | Status: DC

## 2011-04-27 MED ORDER — NEOSTIGMINE METHYLSULFATE 1 MG/ML IJ SOLN
INTRAMUSCULAR | Status: AC
  Filled 2011-04-27: qty 10

## 2011-04-27 MED ORDER — MIDAZOLAM HCL 2 MG/2ML IJ SOLN
INTRAMUSCULAR | Status: AC
  Filled 2011-04-27: qty 2

## 2011-04-27 MED ORDER — CIPROFLOXACIN IN D5W 400 MG/200ML IV SOLN
INTRAVENOUS | Status: DC | PRN
  Administered 2011-04-27: 400 mg via INTRAVENOUS

## 2011-04-27 MED ORDER — LIDOCAINE HCL (CARDIAC) 20 MG/ML IV SOLN
INTRAVENOUS | Status: DC | PRN
  Administered 2011-04-27: 100 mg via INTRAVENOUS

## 2011-04-27 MED ORDER — GLYCOPYRROLATE 0.2 MG/ML IJ SOLN
INTRAMUSCULAR | Status: DC | PRN
  Administered 2011-04-27: 0.4 mg via INTRAVENOUS

## 2011-04-27 MED ORDER — OXYCODONE-ACETAMINOPHEN 5-325 MG PO TABS
ORAL_TABLET | ORAL | Status: AC
  Administered 2011-04-27: 2 via ORAL
  Filled 2011-04-27: qty 2

## 2011-04-27 MED ORDER — PROPOFOL 10 MG/ML IV EMUL
INTRAVENOUS | Status: DC | PRN
  Administered 2011-04-27: 20 mg via INTRAVENOUS
  Administered 2011-04-27: 180 mg via INTRAVENOUS

## 2011-04-27 MED ORDER — OXYCODONE-ACETAMINOPHEN 5-325 MG PO TABS
1.0000 | ORAL_TABLET | Freq: Once | ORAL | Status: AC
  Administered 2011-04-27: 2 via ORAL

## 2011-04-27 MED ORDER — FENTANYL CITRATE 0.05 MG/ML IJ SOLN
25.0000 ug | INTRAMUSCULAR | Status: DC | PRN
  Administered 2011-04-27 (×2): 50 ug via INTRAVENOUS

## 2011-04-27 MED ORDER — GLYCOPYRROLATE 0.2 MG/ML IJ SOLN
INTRAMUSCULAR | Status: AC
  Filled 2011-04-27: qty 2

## 2011-04-27 MED ORDER — DEXAMETHASONE SODIUM PHOSPHATE 10 MG/ML IJ SOLN
INTRAMUSCULAR | Status: AC
  Filled 2011-04-27: qty 1

## 2011-04-27 MED ORDER — LIDOCAINE HCL (CARDIAC) 20 MG/ML IV SOLN
INTRAVENOUS | Status: AC
  Filled 2011-04-27: qty 5

## 2011-04-27 MED ORDER — LACTATED RINGERS IR SOLN
Status: DC | PRN
  Administered 2011-04-27: 3000 mL

## 2011-04-27 MED ORDER — FENTANYL CITRATE 0.05 MG/ML IJ SOLN
INTRAMUSCULAR | Status: AC
  Filled 2011-04-27: qty 5

## 2011-04-27 MED ORDER — ONDANSETRON HCL 4 MG/2ML IJ SOLN
INTRAMUSCULAR | Status: AC
  Filled 2011-04-27: qty 2

## 2011-04-27 MED ORDER — PROPOFOL 10 MG/ML IV EMUL
INTRAVENOUS | Status: AC
  Filled 2011-04-27: qty 20

## 2011-04-27 MED ORDER — FENTANYL CITRATE 0.05 MG/ML IJ SOLN
INTRAMUSCULAR | Status: DC | PRN
  Administered 2011-04-27 (×5): 50 ug via INTRAVENOUS

## 2011-04-27 MED ORDER — FENTANYL CITRATE 0.05 MG/ML IJ SOLN
INTRAMUSCULAR | Status: AC
  Administered 2011-04-27: 50 ug via INTRAVENOUS
  Filled 2011-04-27: qty 2

## 2011-04-27 MED ORDER — CLINDAMYCIN PHOSPHATE 600 MG/50ML IV SOLN
INTRAVENOUS | Status: DC | PRN
  Administered 2011-04-27: 900 mg via INTRAVENOUS

## 2011-04-27 MED ORDER — ROCURONIUM BROMIDE 100 MG/10ML IV SOLN
INTRAVENOUS | Status: DC | PRN
  Administered 2011-04-27: 40 mg via INTRAVENOUS

## 2011-04-27 MED ORDER — NEOSTIGMINE METHYLSULFATE 1 MG/ML IJ SOLN
INTRAMUSCULAR | Status: DC | PRN
  Administered 2011-04-27: 5 mg via INTRAVENOUS

## 2011-04-27 MED ORDER — LACTATED RINGERS IV SOLN
INTRAVENOUS | Status: DC
  Administered 2011-04-27: 11:00:00 via INTRAVENOUS
  Administered 2011-04-27: 50 mL/h via INTRAVENOUS
  Administered 2011-04-27: 13:00:00 via INTRAVENOUS

## 2011-04-27 MED ORDER — BUPIVACAINE HCL (PF) 0.25 % IJ SOLN
INTRAMUSCULAR | Status: AC
Start: 2011-04-27 — End: 2011-04-27
  Filled 2011-04-27: qty 30

## 2011-04-27 MED ORDER — ALBUTEROL SULFATE (5 MG/ML) 0.5% IN NEBU
INHALATION_SOLUTION | RESPIRATORY_TRACT | Status: AC
  Administered 2011-04-27: 2.5 mg via RESPIRATORY_TRACT
  Filled 2011-04-27: qty 0.5

## 2011-04-27 SURGICAL SUPPLY — 31 items
ADH SKN CLS APL DERMABOND .7 (GAUZE/BANDAGES/DRESSINGS) ×2
BAG SPEC RTRVL LRG 6X4 10 (ENDOMECHANICALS)
BARRIER ADHS 3X4 INTERCEED (GAUZE/BANDAGES/DRESSINGS) IMPLANT
BLADE SURG 15 STRL LF C SS BP (BLADE) ×1 IMPLANT
BLADE SURG 15 STRL SS (BLADE) ×2
BRR ADH 4X3 ABS CNTRL BYND (GAUZE/BANDAGES/DRESSINGS)
CABLE HIGH FREQUENCY MONO STRZ (ELECTRODE) IMPLANT
CATH ROBINSON RED A/P 16FR (CATHETERS) ×2 IMPLANT
CLOTH BEACON ORANGE TIMEOUT ST (SAFETY) ×2 IMPLANT
DERMABOND ADVANCED (GAUZE/BANDAGES/DRESSINGS) ×2
DERMABOND ADVANCED .7 DNX12 (GAUZE/BANDAGES/DRESSINGS) IMPLANT
GLOVE BIO SURGEON STRL SZ7.5 (GLOVE) ×4 IMPLANT
GOWN PREVENTION PLUS LG XLONG (DISPOSABLE) ×4 IMPLANT
NS IRRIG 1000ML POUR BTL (IV SOLUTION) ×2 IMPLANT
PACK LAPAROSCOPY BASIN (CUSTOM PROCEDURE TRAY) ×2 IMPLANT
POUCH SPECIMEN RETRIEVAL 10MM (ENDOMECHANICALS) IMPLANT
PROTECTOR NERVE ULNAR (MISCELLANEOUS) ×2 IMPLANT
SCALPEL HARMONIC ACE (MISCELLANEOUS) ×1 IMPLANT
SET IRRIG TUBING LAPAROSCOPIC (IRRIGATION / IRRIGATOR) ×1 IMPLANT
SLEEVE Z-THREAD 5X100MM (TROCAR) IMPLANT
SOLUTION ELECTROLUBE (MISCELLANEOUS) IMPLANT
SUT PLAIN 4 0 FS 2 27 (SUTURE) ×1 IMPLANT
SUT VIC AB 3-0 PS1 18 (SUTURE) ×2
SUT VIC AB 3-0 PS1 18XBRD (SUTURE) IMPLANT
SUT VICRYL 0 ENDOLOOP (SUTURE) IMPLANT
SUT VICRYL 0 UR6 27IN ABS (SUTURE) ×2 IMPLANT
TOWEL OR 17X24 6PK STRL BLUE (TOWEL DISPOSABLE) ×4 IMPLANT
TROCAR XCEL NON-BLD 11X100MML (ENDOMECHANICALS) ×3 IMPLANT
TROCAR XCEL NON-BLD 5MMX100MML (ENDOMECHANICALS) ×2 IMPLANT
TROCAR Z-THREAD FIOS 5X100MM (TROCAR) ×2 IMPLANT
WATER STERILE IRR 1000ML POUR (IV SOLUTION) ×2 IMPLANT

## 2011-04-27 NOTE — Op Note (Signed)
Cassidy Tran Jul 08, 1975 [id number]   Post Operative Note   Date of surgery:  04/27/2011  Pre Op Dx: Pelvic pain  Post Op Dx:   #1  Endometriosis   #2  Vesicouterine adhesions   #3  Right ovarian cyst  Procedure:   #1 laparoscopic lysis of adhesions   #2 right ovarian cystectomy   #3 biopsy suspected endometriosis  Surgeon:  Colin Broach P  Anesthesia:  General  EBL:  minimal  Complications:  None  Specimen:  #1  Right ovarian cyst wall. #2 Suspected endometriosis to pathology  Findings: EUA:  External BUS vagina normal. Cervix grossly normal.  Uterus grossly normal midline mobile.  Adnexa without masses   Operative:  Anterior cul-de-sac obliterated with the vesico- anterior uterine adhesions.  After separation of the adhesions dark shaggy superficial peroneal areas noted. Unsure whether cautery artifact versus endometriosis and representative areas were biopsied. A firm nodule subperitoneal mid anterior cul-de-sac overlying bladder palpated through probe approximately 2-3 cm suspicious for an endometriotic implant. Posterior cul-de-sac normal. Uterus with mild nodularity anteriorly suggestive of a small intramural myoma.right left fallopian tubes normal length caliber fimbriated end is free and mobile. Left ovary grossly normal free and mobile. Right ovary free and mobile with 3 cm cyst excised in its entirety. No evidence of peritoneal endometriosis other than the anterior cul-de-sac. Upper abdominal exam shows appendix retrocecal and adherent to the right flank.  Liver/gallbladder obscured by omental fat. No evidence of upper abdominal disease or gross pathology.  Procedure:  Patient was taken to the operating room, placed in the supine position, underwent general anesthesia without difficulty, placed in low dorsal lithotomy position, received abdominal/perineal/vaginal preparation with Betadine solution, the bladder was emptied with an in and out Foley catheterization and EUA  was performed. The cervix was visualized with a speculum and a Hulka tenaculum was placed without difficulty and the patient was draped in the usual fashion. A vertical infraumbilical incision was made and using the 10 mm Optiview direct entry trocar the abdomen was directly entered under visualization without difficulty. The abdomen was then insufflated and right and left 5 mm suprapubic ports were placed under direct visualization after transillumination for the vessels without difficulty.  Examination of the pelvic organs and upper abdominal exam was carried out with findings noted above. The right ovarian cyst was then grasped and incised with the harmonic scalpel and the cyst wall was peeled from the ovarian stroma in its entirety. The specimen was sent to pathology. Hemostasis was spontaneously achieved in the ovarian stroma. Attention was then turned to the anterior cul-de-sac peritoneal adhesions. This area was separated using the harmonic scalpel without difficulty. Palpation with the probe revealed a nodule approximately 2-3 cm subperitoneal overlying the bladder in the midline consistent with an endometriotic nodule. There were some shaggy brown overlying peritoneal areas which I biopsied, unsure if this was cautery artifact or actual endometriosis uncovered during the lysis of adhesions. The specimen was also sent to pathology. The pelvic organs were copiously irrigated and hemostasis was spontaneously achieved. The right and left suprapubic was removed with hemostasis of the port sites visualized and the gas was allowed to escape noting adequate hemostasis at both the ovary and anterior cul-de-sac surgical sites under a low pressure situation. The umbilical port was then backed out under visualization showed good hemostasis and no evidence of hernia formation. All skin incisions were injected using 0.25% Marcaine and a 0 Vicryl subcutaneous fascial stitch was placed infraumbilically. The umbilical skin  incision was  closed using 3-0 Vicryl in interrupted cuticular stitch and the suprapubic incision sites were closed using Dermabond skin adhesive. The Hulka tenaculum was removed, sterile dressings applied, patient placed in the supine position and awakened without difficulty and taken to the recovery room.  Dara Lords MD, 1:07 PM 04/27/2011

## 2011-04-27 NOTE — Progress Notes (Signed)
During breathing treatment pt showed signs of resp. distress mouthed she could not breath vital signs remained stable during this time. Dr. [name] requested and at bedside in PACU no new orders received emotional support given to patient.   Pt able to calm down and breathing returned to normal will continue treatment and oxygen 15L via simple mask HOB 90 degree.  Will continue to monitor

## 2011-04-27 NOTE — Transfer of Care (Signed)
Immediate Anesthesia Transfer of Care Note  Patient: Cassidy Tran  Procedure(s) Performed: Procedure(s) (LRB): LAPAROSCOPY OPERATIVE (N/A)  Patient Location: PACU  Anesthesia Type: General  Level of Consciousness: awake, alert , oriented and patient cooperative  Airway & Oxygen Therapy: Patient Spontanous Breathing and Patient connected to nasal cannula oxygen  Post-op Assessment: Report given to PACU RN, Post -op Vital signs reviewed and stable and Patient moving all extremities  Post vital signs: Reviewed and stable  Complications: No apparent anesthesia complications

## 2011-04-27 NOTE — Anesthesia Preprocedure Evaluation (Signed)
Anesthesia Evaluation  Patient identified by MRN, date of birth, ID band Patient awake    Reviewed: Allergy & Precautions, H&P , NPO status , Patient's Chart, lab work & pertinent test results, reviewed documented beta blocker date and time   History of Anesthesia Complications Negative for: history of anesthetic complications  Airway Mallampati: III TM Distance: >3 FB Neck ROM: full   Comment: + TMJ - audible click with mouth opening, no pain Dental  (+) Teeth Intact   Pulmonary asthma (no hosp, no intub - uses rescue inhaler daily, several years since steroids) , Current Smoker (< 1/2 ppd),    Pulmonary exam normal + wheezing (will get inhaler from husband and use now prior to OR)      Cardiovascular Exercise Tolerance: Good regular Normal    Neuro/Psych  Headaches (migraines - last 2 days ago, related to sinuses), Seizures - (last seizure 2 years ago), Well Controlled,  Negative Psych ROS   GI/Hepatic negative GI ROS, Neg liver ROS,   Endo/Other  Morbid obesity  Renal/GU negative Renal ROS  Female GU complaint (pelvic pain - on percocet (takes 1/2 percocet at a time))     Musculoskeletal   Abdominal   Peds  Hematology  (+) Blood dyscrasia (von willebrands - will use stimate nasal spray after surgery), ,   Anesthesia Other Findings   Reproductive/Obstetrics negative OB ROS                           Anesthesia Physical Anesthesia Plan  ASA: III  Anesthesia Plan: General ETT   Post-op Pain Management:    Induction:   Airway Management Planned:   Additional Equipment:   Intra-op Plan:   Post-operative Plan:   Informed Consent: I have reviewed the patients History and Physical, chart, labs and discussed the procedure including the risks, benefits and alternatives for the proposed anesthesia with the patient or authorized representative who has indicated his/her understanding and  acceptance.   Dental Advisory Given  Plan Discussed with: CRNA and Surgeon  Anesthesia Plan Comments:         Anesthesia Quick Evaluation

## 2011-04-27 NOTE — Preoperative (Signed)
Beta Blockers   Reason not to administer Beta Blockers:Not Applicable 

## 2011-04-27 NOTE — H&P (Signed)
  The patient was examined.  I reviewed the proposed surgery and consent form with the patient.  The dictated history and physical is current and accurate and all questions were answered. The patient is ready to proceed with surgery and has a realistic understanding and expectation for the outcome.   Dara Lords MD, 11:14 AM 04/27/2011

## 2011-04-27 NOTE — Progress Notes (Signed)
Pt after arrival to PACU began to cough flushed in face oxygen saturation decreased to 89% on 15L simple mask Dr. [name] made aware by E. Speight CRNA orders received increased HOB pts lungs wheezing noted throughout will complete orders and continue to monitor.

## 2011-04-28 ENCOUNTER — Encounter (HOSPITAL_COMMUNITY): Payer: Self-pay | Admitting: Gynecology

## 2011-04-28 NOTE — Anesthesia Postprocedure Evaluation (Signed)
Anesthesia Post Note  Patient: Cassidy Tran  Procedure(s) Performed: Procedure(s) (LRB): LAPAROSCOPY OPERATIVE (N/A)  Anesthesia type: General  Patient location: PACU  Post pain: Pain level controlled  Post assessment: Post-op Vital signs reviewed  Last Vitals:  Filed Vitals:   04/27/11 1430  BP: 118/77  Pulse: 108  Temp: 37 C  Resp: 26    Post vital signs: Reviewed  Level of consciousness: sedated  Complications: No apparent anesthesia complications

## 2011-05-01 ENCOUNTER — Telehealth: Payer: Self-pay | Admitting: *Deleted

## 2011-05-01 NOTE — Telephone Encounter (Signed)
Pt had laparoscopy on 04/27/11. Pt said she start spotting on Friday night, she has no cramping. She called the on call doctor which was dr. Nicholas Lose and was told that it was her period? Pt said she doesn't believe him because he doesn't know her. Pt wanted me to relay this message to you, asking if this was normal? And will she have normal periods now? Please advise

## 2011-05-01 NOTE — Telephone Encounter (Signed)
Pt informed with the below note. 

## 2011-05-01 NOTE — Telephone Encounter (Signed)
Tough to say after surgery. She did have instrumentation which sometimes can bring on bleeding a week or 2 after surgery. I would just watch it for now we'll see her at her postop appointment.

## 2011-05-15 ENCOUNTER — Ambulatory Visit (INDEPENDENT_AMBULATORY_CARE_PROVIDER_SITE_OTHER): Payer: PRIVATE HEALTH INSURANCE | Admitting: Gynecology

## 2011-05-15 ENCOUNTER — Encounter: Payer: Self-pay | Admitting: Gynecology

## 2011-05-15 DIAGNOSIS — N809 Endometriosis, unspecified: Secondary | ICD-10-CM

## 2011-05-15 NOTE — Progress Notes (Signed)
Patient also postop from diagnostic laparoscopy which showed a right ovarian cyst which turned out to be a follicular cyst and vesicouterine adhesions when separated showed some underlying endometriosis and a firm subperitoneal nodule 2-3 cm suspicious for endometriotic implants. It's in the vesicouterine fold. She notes and surgery she's done well.  Exam was Cassidy Tran chaperone present Abdomen soft nontender without masses guarding rebound organomegaly. Several sutures still in the midline umbilical incision and these were removed. Otherwise incisions healed nicely. Bimanual without masses or tenderness. I could not palpate any nodularity in the anterior vaginal wall.  Assessment and plan: Pelvic pain biopsy-proven endometriosis. Pelvis was clear other than the anterior vesicouterine adhesions and fibrotic-type nodule palpated through the laparoscope. Recommend start with cystoscopy rule out bladder wall involvement as well as MRI to better define this area and then we'll go from there. Possible excision versus Lupron suppression reviewed. I think if she ultimately decides on excision that I will refer her to [name] for consideration of robotic excision given the possibility of cystotomy and need for repair.

## 2011-05-15 NOTE — Patient Instructions (Signed)
Follow up for MRI and urology appointment as we will schedule for you.

## 2011-05-17 ENCOUNTER — Telehealth: Payer: Self-pay | Admitting: *Deleted

## 2011-05-17 ENCOUNTER — Other Ambulatory Visit: Payer: Self-pay | Admitting: *Deleted

## 2011-05-17 DIAGNOSIS — IMO0002 Reserved for concepts with insufficient information to code with codable children: Secondary | ICD-10-CM

## 2011-05-17 NOTE — Telephone Encounter (Signed)
Message copied by Mckinley Jewel Robby Pirani L on Wed May 17, 2011  4:07 PM ------      Message from: Colin Broach P      Created: Mon May 15, 2011  2:15 PM       Schedule #1  appointment with urology are the suspected endometriotic nodule 3 cm in the vesicouterine fold. Request cystoscopy to rule out bladder wall involvement. Also #2  schedule MRI of the pelvis RE 2-3 cm nodule in the vesicouterine fold.

## 2011-05-17 NOTE — Telephone Encounter (Signed)
appt set for MRI at [facility] on 05/23/11 @ 9am and appt for Dr. Brunilda Payor on 06/13/11 @3 :30pm. Records faxed.

## 2011-05-23 ENCOUNTER — Ambulatory Visit (HOSPITAL_COMMUNITY)
Admission: RE | Admit: 2011-05-23 | Discharge: 2011-05-23 | Disposition: A | Discharge status: Home / Self Care | Payer: PRIVATE HEALTH INSURANCE | Source: Ambulatory Visit | Attending: Gynecology | Admitting: Gynecology

## 2011-05-23 DIAGNOSIS — N8000 Endometriosis of the uterus, unspecified: Secondary | ICD-10-CM | POA: Insufficient documentation

## 2011-05-23 DIAGNOSIS — IMO0002 Reserved for concepts with insufficient information to code with codable children: Secondary | ICD-10-CM

## 2011-05-23 DIAGNOSIS — N9489 Other specified conditions associated with female genital organs and menstrual cycle: Secondary | ICD-10-CM | POA: Insufficient documentation

## 2011-05-23 DIAGNOSIS — N72 Inflammatory disease of cervix uteri: Secondary | ICD-10-CM | POA: Insufficient documentation

## 2011-05-23 MED ORDER — GADOBENATE DIMEGLUMINE 529 MG/ML IV SOLN
20.0000 mL | Freq: Once | INTRAVENOUS | Status: AC | PRN
  Administered 2011-05-23: 20 mL via INTRAVENOUS

## 2011-06-01 ENCOUNTER — Telehealth: Payer: Self-pay | Admitting: *Deleted

## 2011-06-01 NOTE — Telephone Encounter (Signed)
When his urology appointment?

## 2011-06-01 NOTE — Telephone Encounter (Signed)
Pt called c/o pelvic cramping x 2 days now, she is post LAPAROSCOPY on 04/27/11. Pt has took some tramadol that she left from her surgery and no relief, she is using heating pad. No other complaints cramping only. Please advise

## 2011-06-02 MED ORDER — HYDROCODONE-ACETAMINOPHEN 2.5-500 MG PO TABS: 1.0000 | ORAL_TABLET | Freq: Four times a day (QID) | ORAL | Status: AC | PRN

## 2011-06-02 NOTE — Telephone Encounter (Signed)
Her urology appointment is April 16 th.

## 2011-06-02 NOTE — Telephone Encounter (Signed)
Lortab 2.5/500 #20 one to 2 by mouth every 4-6 hours when necessary pain no refill

## 2011-06-02 NOTE — Telephone Encounter (Signed)
Pt informed and rx calle din  

## 2011-06-15 ENCOUNTER — Encounter: Payer: Self-pay | Admitting: Gynecology

## 2011-06-15 ENCOUNTER — Ambulatory Visit (INDEPENDENT_AMBULATORY_CARE_PROVIDER_SITE_OTHER): Payer: PRIVATE HEALTH INSURANCE | Admitting: Gynecology

## 2011-06-15 DIAGNOSIS — R102 Pelvic and perineal pain: Secondary | ICD-10-CM

## 2011-06-15 DIAGNOSIS — N809 Endometriosis, unspecified: Secondary | ICD-10-CM

## 2011-06-15 DIAGNOSIS — N949 Unspecified condition associated with female genital organs and menstrual cycle: Secondary | ICD-10-CM

## 2011-06-15 NOTE — Patient Instructions (Signed)
Office will contact you to initiate the Depo-Lupron shot.

## 2011-06-15 NOTE — Progress Notes (Signed)
Patient presents in follow up to pelvic cramping. She has history of pelvic pain, underwent laparoscopy where the vesicouterine fold was guarded. We freed this up she had some nodularity in her mucosal surface overlying the bladder and a biopsy this area did show endometriosis. A follow up MRI showed some mild thickening in the bladder wall the dome which I think is the area I was feeling but no gross evidence of a nodule or pathology.  She saw urology and I do not have a copy of this report but she states they did a cystoscopy and did not see any pathology in the mucosa. She continues to have intermittent pelvic cramping on and off throughout the month. Her menses are regular otherwise.  Exam the same he chaperone present Abdomen soft nontender without masses guarding rebound organomegaly. Pelvic external BUS vagina normal cervix normal bimanual uterus normal size midline mobile nontender adnexa without masses or tenderness  Assessment and plan: 1. Pelvic cramping/endometriosis. Options for management were reviewed. She is over 35, does smoke cigarettes, is followed for seizure disorder and von Willebrand's disease. After reviewing all the various possibilities to include medication/surgery I recommended a trial of Depo-Lupron x 6 months to see if we can't get relief of her immediate pain, if not longer term pain relief. I reviewed with her the possibility of changes in her seizure activity with changes in her estrogen. Also reviewed the possible bump in fertility following the Lupron treatment and the need to either use very assured contraception or pursue pregnancy at that time if they desire. Patient agrees with this plan wet make arrangements for this. We'll administer the first injection with her menses.  Side effect profile of menopausal symptoms/calcium balance reviewed. 2. History low-grade SIL. Her last Pap smear in February showed ASCUS negative high risk HPV. She is due for her annual exam  late-summer and she knows will repeat her Pap smear at that time.

## 2011-06-19 ENCOUNTER — Telehealth: Payer: Self-pay | Admitting: *Deleted

## 2011-06-19 MED ORDER — OXYCODONE-ACETAMINOPHEN 5-300 MG PO TABS: 1.0000 | ORAL_TABLET | Freq: Four times a day (QID) | ORAL | Status: AC | PRN

## 2011-06-19 MED ORDER — OXYCODONE-ACETAMINOPHEN 2.5-325 MG PO TABS: 1.0000 | ORAL_TABLET | ORAL | Status: DC | PRN

## 2011-06-19 NOTE — Telephone Encounter (Signed)
pharamcy told pt that she will need paper copy of percocet 2.5-325, TF will sign rx.

## 2011-06-19 NOTE — Telephone Encounter (Signed)
I think given her picture that Depo-Lupron is probably her best choice for long-term pain relief at this point. We can use pain medication but she can't be on this chronically such as daily or even every several days. With her cigarette smoking, oral contraceptives are probably not a good choice. You can try progesterone only but spotting or irregular bleeding can be an issue as well as contraception. I would strongly suggest she try the Depo-Lupron and I can temporize the pain at this point with oxycodone 2.5 #20.

## 2011-06-19 NOTE — Telephone Encounter (Signed)
rx called into pharmacy, pt informed with the below note.

## 2011-06-19 NOTE — Telephone Encounter (Signed)
Pt calling c/o pelvic pain cramping pain in ovary area. Pt said that pain was much worse over the weekend, took OTC but no relief. I asked pt regarding Lupron shot and pt said she doesn't want it, wanted to know if you know any other suggestions? Pt would like something to relieve that pain. Please advise

## 2011-06-19 NOTE — Telephone Encounter (Signed)
Pt came back to office stating pharmacy doesn't have the percocet 2.5-325 mg tablet. Called pharmacy and was told the same. Pt would like another rx. Please advise

## 2011-06-19 NOTE — Telephone Encounter (Signed)
Oxycodone 5.0/325 #20 one by mouth every 6 hours when necessary pain

## 2011-06-19 NOTE — Telephone Encounter (Signed)
rx signed and given to pt 

## 2011-06-21 ENCOUNTER — Telehealth: Payer: Self-pay | Admitting: *Deleted

## 2011-06-21 MED ORDER — NORETHINDRONE ACETATE 5 MG PO TABS: 5.0000 mg | ORAL_TABLET | Freq: Every day | ORAL | Status: DC

## 2011-06-21 NOTE — Telephone Encounter (Signed)
We could try Aygestin 5 mg daily suppression which may help with endometriosis. Again, we must make sure that she's not early pregnant and I would suggest a pregnancy test before and assured birth control such as condoms consistently.

## 2011-06-21 NOTE — Telephone Encounter (Signed)
Pt informed about Aygestin and wants to try it. She will p/u from pharmacy.

## 2011-06-21 NOTE — Telephone Encounter (Signed)
Called patient to tell her that Lupron is still being processed with insurance and needs prior authorization that [employer] is taking care of and may still be some time before we hear anything.  Patient says Percocet is not helping pain.  She wants to know if there is anything else she could do to help or other med to help with pain?

## 2011-07-03 ENCOUNTER — Telehealth: Payer: Self-pay | Admitting: *Deleted

## 2011-07-03 NOTE — Telephone Encounter (Signed)
Pt informed with the below note. 

## 2011-07-03 NOTE — Telephone Encounter (Signed)
Pt was given aygestin 5 mg tablets 1 po daily # 30 on 06/19/11. Pt said that medication works for the time but then begins to wear off. Over the weekend pt c/o pelvic pressure and sharp pelvic pain with cramps. Please advise

## 2011-07-03 NOTE — Telephone Encounter (Signed)
She needs to stay with the course. I really have nothing more to offered her until she gets the Depo-Lupron on board.

## 2011-07-04 ENCOUNTER — Telehealth: Payer: Self-pay | Admitting: *Deleted

## 2011-07-04 NOTE — Telephone Encounter (Signed)
Stop the Aygestin

## 2011-07-04 NOTE — Telephone Encounter (Signed)
Pt is taking Aygestin 5mg , she states she is having some blurred vision and double vision, she said she saw this is a side effect and also is worried because she has a h/o seizures and is on medication for it but she states also saw on the insert not to take if has a h/o seizures. Also she complains of an increase of migraines and can only contribute the increase to the hormone. Can you give some guidance on what to do? pls advise

## 2011-07-04 NOTE — Telephone Encounter (Signed)
Pt was informed to stop. Still working with her insurance that is difficult to get the lupron. She is asking what to do with the pain she is having. pls advise

## 2011-07-04 NOTE — Telephone Encounter (Signed)
I really don't have a lot more to offer. She can continue with her pain medication but I don't want to over use that and see that she becomes tolerant of it. I apologize she's having the pain but I can't help with the insurance that maybe if she would call them to help accelerate the process.

## 2011-07-05 MED ORDER — OXYCODONE-ACETAMINOPHEN 5-325 MG PO TABS: 1.0000 | ORAL_TABLET | ORAL | Status: DC | PRN

## 2011-07-05 NOTE — Telephone Encounter (Signed)
Spoke with pt regarding the below note, she is very worried about Lupron  because she has a h/o seizures and is on medication for it but she states also saw on the insert not to take if has a h/o seizures. Pt would like to know if any other options rather than Lupron and hysterectomy? Pt also said she is out of pain medication Oxycodone 5.0/325 #20 one by mouth every 6 hours, she is going out of town.Pt would like rx. Please advise

## 2011-07-05 NOTE — Telephone Encounter (Signed)
Pt informed with the below note, rx left up front for pick up.

## 2011-07-05 NOTE — Telephone Encounter (Signed)
Only other alternative that I could think of would be a Mirena IUD. There is some evidence that the local progesterone effect seems to help with pelvic pain. She be interested in trying that and she can make an appointment and we can place it. I also refilled her oxycodone but again I cannot keep refilling this continually

## 2011-07-05 NOTE — Telephone Encounter (Signed)
Pt said doesn't want to proceed with the Lupron.

## 2011-07-10 ENCOUNTER — Ambulatory Visit: Payer: PRIVATE HEALTH INSURANCE | Admitting: Gynecology

## 2011-07-11 ENCOUNTER — Ambulatory Visit (INDEPENDENT_AMBULATORY_CARE_PROVIDER_SITE_OTHER): Payer: PRIVATE HEALTH INSURANCE | Admitting: Gynecology

## 2011-07-11 ENCOUNTER — Encounter: Payer: Self-pay | Admitting: Gynecology

## 2011-07-11 DIAGNOSIS — N949 Unspecified condition associated with female genital organs and menstrual cycle: Secondary | ICD-10-CM

## 2011-07-11 DIAGNOSIS — N809 Endometriosis, unspecified: Secondary | ICD-10-CM

## 2011-07-11 DIAGNOSIS — R102 Pelvic and perineal pain: Secondary | ICD-10-CM

## 2011-07-11 MED ORDER — OXYCODONE-ACETAMINOPHEN 7.5-500 MG PO TABS: 1.0000 | ORAL_TABLET | Freq: Four times a day (QID) | ORAL | Status: AC | PRN

## 2011-07-11 MED ORDER — OXYCODONE HCL 7.5 MG PO TABS: 7.5000 mg | ORAL_TABLET | Freq: Three times a day (TID) | ORAL | Status: DC

## 2011-07-11 NOTE — Patient Instructions (Signed)
Discuss her situation with your neurologist. Follow up with me afterwards. Her interested in a second opinion from a gynecologist let me know and we can help arrange this or you can arrange that on your own.

## 2011-07-11 NOTE — Progress Notes (Signed)
Addended by: Dara Lords on: 07/11/2011 04:04 PM   Modules accepted: Orders

## 2011-07-11 NOTE — Progress Notes (Signed)
Patient presents with her mother to discuss options due to her pelvic pain. She has laparoscopic proven endometriosis of the bladder flap without other significant findings at the time of surgery. Her pelvic pain has accelerated over the past year or so where it started initially with her menses and now she will have daily pain described as menstrual-like character with pelvic cramping. She had tried Ultram which did not help her. We've tried oxycodone 5.0 which again did not seem strong enough. She cannot take aspirin or other nonsteroidals given her asthma history. She smokes and is over 35 and I do not think estrogen-containing medications such as birth control pills is wise. We tried Aygestin and she noted more "twitching" that seems to be a seizure-like activity. I suggested a trial of Mirena IUD and she's fearful as package insert cautions its use with Depakote. I strongly suggested Depo-Lupron and again she is concerned because of some package labeling suggesting interaction with seizure-type activity. I reviewed with her and her mother the changes in hormonal levels certainly can precipitate seizure activity but I am unsure that being on a stable dose of Depo-Lupron that this would be an issue. She was leaning towards surgery including hysterectomy with or without salpingo-oophorectomy. She is nulliparous I again discussed with her the issues of absolute and irreversible sterility. She states that she never planned on having natural children and was planning on adoption but I stressed the need to seriously understand the consequences of hysterectomy as far as no children. I also reviewed the issue of salpingo-oophorectomy my suggestion to keep her ovaries at least initially but clearly recognizing that she may require subsequent surgery to remove them if she would continue to have pain or if she had recurrent pain. The issues of removing her ovaries and accelerated cardiovascular risk and osteoporotic risk  as well as the potential for significant symptoms requiring an attempt at HRT and the potential of HRT again interacting from a seizure activity standpoint. After lengthy discussion she is going to call her neurologist and discussed all this with them with very pointedly seeing if they would consider Depo-Lupron. She can follow up with me after that discussion. I gave her a prescription for oxycodone 7.5 #26 this doesn't help with her menstrual pain. She understands my recommendation not to use this daily but only when she has significant exacerbations. I also offered a second opinion with another gynecologist experienced with the treatment of endometriosis.

## 2011-07-11 NOTE — Progress Notes (Signed)
Addended by: Dara Lords on: 07/11/2011 04:08 PM   Modules accepted: Orders

## 2011-07-20 ENCOUNTER — Telehealth: Payer: Self-pay | Admitting: *Deleted

## 2011-07-20 NOTE — Telephone Encounter (Signed)
The patient spoke with her Neurologist and states it is ok to proceed with the Depo Lupron injection. I will proceed to get benefits and get it ordered for her.

## 2011-07-20 NOTE — Telephone Encounter (Signed)
When I spoke to patient a second time to let her know that her insurance requires a prior authorization she mentioned a daily Lupron injection vs a month or 3 month Lupron shot. Please advise dose of Lupron so I can proceed with prior authorization. Pt knows she may not hear back from me til next week. KW

## 2011-07-20 NOTE — Telephone Encounter (Signed)
Patient is concerned about increasing seizures with Lupron and is in the process of discussing with her neurologist.  My suggestion would be for the monthly dose.  I think the daily would be too cumbersome.

## 2011-07-21 NOTE — Telephone Encounter (Signed)
Informed and wants to discuss daily dose and has an appt for Tuesday to discuss it.

## 2011-07-25 ENCOUNTER — Ambulatory Visit (INDEPENDENT_AMBULATORY_CARE_PROVIDER_SITE_OTHER): Payer: PRIVATE HEALTH INSURANCE | Admitting: Gynecology

## 2011-07-25 ENCOUNTER — Encounter: Payer: Self-pay | Admitting: Gynecology

## 2011-07-25 DIAGNOSIS — N949 Unspecified condition associated with female genital organs and menstrual cycle: Secondary | ICD-10-CM

## 2011-07-25 DIAGNOSIS — R102 Pelvic and perineal pain: Secondary | ICD-10-CM

## 2011-07-25 DIAGNOSIS — N809 Endometriosis, unspecified: Secondary | ICD-10-CM

## 2011-07-25 NOTE — Patient Instructions (Signed)
Start Lupron as we discussed.

## 2011-07-25 NOTE — Progress Notes (Signed)
Patient presents with her mother to discuss her ongoing pelvic pain.  See 07/11/2011 note for a summary. Patient had seen her neurologist historically due to her seizure history and they were okay with her trying Lupron. She asked about daily versus monthly. I discussed what is involved with daily injections and she's not interested wants to go with the monthly shots. She understands that it may exacerbate her seizure activity and she plans on not driving the first month of the injection. We again reviewed options to include surgical versus nonsurgical and I think that the Depo-Lupron would be a great choice both from a therapeutic option as well as a diagnostic in that if her pain totally resolves with the suppression and that certainly points to endometriosis being the etiology for her pain. She does ultimately decide on surgery I again discussed my preference to refer her to a robotic center to allow for more extensive laparoscopic possibilities such as excision of the vesicouterine fold area. Options for referral to Dr. Genevive Bi at [facility] was also reviewed. She asked about options for chronic pelvic pain and she is already on amitriptyline. Possible Neurontin also discussed that I would want her neurologist to supervise his given her neurologic history.  I did discuss the issues of flare effect and risks of Lupron to include symptoms and accelerated bone loss. Patient's comfortable restarting Lupron and will arrange for the monthly injection and she does have a positive responsible plan at least 6 months to one year treatment. Her mother asked about stop smoking and use of low-dose oral contraceptives. I reviewed with her that there is no abrupt decreased risk of thrombosis with quitting in that I am unsure at what point she would prefer to baseline age-related risk but certainly I don't think it would be any time soon that would help in this situation.

## 2011-08-11 ENCOUNTER — Telehealth: Payer: Self-pay | Admitting: *Deleted

## 2011-08-11 ENCOUNTER — Ambulatory Visit: Payer: PRIVATE HEALTH INSURANCE

## 2011-08-11 NOTE — Telephone Encounter (Signed)
Informed Lupron monthly has arrived. Pt finished period yesterday will come today for injection.

## 2011-08-14 ENCOUNTER — Ambulatory Visit (INDEPENDENT_AMBULATORY_CARE_PROVIDER_SITE_OTHER): Payer: PRIVATE HEALTH INSURANCE | Admitting: Anesthesiology

## 2011-08-14 DIAGNOSIS — N809 Endometriosis, unspecified: Secondary | ICD-10-CM

## 2011-08-14 MED ORDER — LEUPROLIDE ACETATE 3.75 MG IM KIT
3.7500 mg | PACK | Freq: Once | INTRAMUSCULAR | Status: AC
  Administered 2011-08-14: 3.75 mg via INTRAMUSCULAR

## 2011-09-06 ENCOUNTER — Telehealth: Payer: Self-pay | Admitting: *Deleted

## 2011-09-06 NOTE — Telephone Encounter (Signed)
Pt called ready for her next Lupron injection. Her medication will be here by Caleen Essex 7/12. Her shot is due next week.. LM for pt to call back Pt called to say she will come Tues 7/16 at 2pm for the injection

## 2011-09-12 ENCOUNTER — Ambulatory Visit (INDEPENDENT_AMBULATORY_CARE_PROVIDER_SITE_OTHER): Payer: PRIVATE HEALTH INSURANCE | Admitting: Gynecology

## 2011-09-12 DIAGNOSIS — N809 Endometriosis, unspecified: Secondary | ICD-10-CM

## 2011-09-12 MED ORDER — LEUPROLIDE ACETATE 3.75 MG IM KIT
3.7500 mg | PACK | Freq: Once | INTRAMUSCULAR | Status: AC
  Administered 2011-09-12: 3.75 mg via INTRAMUSCULAR

## 2011-09-14 ENCOUNTER — Ambulatory Visit (INDEPENDENT_AMBULATORY_CARE_PROVIDER_SITE_OTHER): Payer: PRIVATE HEALTH INSURANCE | Admitting: Gynecology

## 2011-09-14 ENCOUNTER — Encounter: Payer: Self-pay | Admitting: Gynecology

## 2011-09-14 DIAGNOSIS — N951 Menopausal and female climacteric states: Secondary | ICD-10-CM

## 2011-09-14 DIAGNOSIS — G4700 Insomnia, unspecified: Secondary | ICD-10-CM

## 2011-09-14 MED ORDER — NORETHINDRONE ACETATE 5 MG PO TABS: 5.0000 mg | ORAL_TABLET | Freq: Every day | ORAL | Status: DC

## 2011-09-14 MED ORDER — ZOLPIDEM TARTRATE 10 MG PO TABS: 10.0000 mg | ORAL_TABLET | Freq: Every evening | ORAL | Status: DC | PRN

## 2011-09-14 NOTE — Progress Notes (Signed)
Patients presents to discuss some side effects that she is having periods she is into her second Depo-Lupron says that her pelvic cramping and pain has decreased almost gone.  She is having hot flashes, night sweats, moodiness and electric feeling sensations that she done her legs the last several minutes daily. She is also having headaches. Difficulty sleeping despite amitriptyline 25 mg nightly. Reviewed that most of these are probably menopausal symptoms type Depo-Lupron side effects. The electrical symptoms shooting down her legs am not sure how but it did start with the Depo-Lupron. Recommended we add norethindrone 5 mg daily to see if we can help with her hot flushes. Ambien 10 mg nightly as needed for sleep #30 no refill monitoring the electrical shooting pains. She'll call us when she is at the end of her second 3 month window of Depo-Lupron and consider a third for a 9 month course. Sooner if any of these symptoms persist worsen or change.

## 2011-09-14 NOTE — Patient Instructions (Signed)
Start norethindrone 5 mg daily to help with the hot flashes night sweats symptoms. Ambien 10 mg nightly as needed for sleep. Call us in follow up as to how you're doing and call us a week before you're due for your next Depo-Lupron shot so that we can decide if we want to go with another shot.

## 2011-09-18 ENCOUNTER — Telehealth: Payer: Self-pay | Admitting: *Deleted

## 2011-09-18 NOTE — Telephone Encounter (Signed)
Left message to call.

## 2011-09-18 NOTE — Telephone Encounter (Signed)
I do not think it is related to this but I would stop it for now. More importantly she needs to be seen by mental health provider ASAP regardless whether she feels suicidal or not.

## 2011-09-18 NOTE — Telephone Encounter (Signed)
Pt informed with the below note, gave pt # to behavioral health, pt said she will call.

## 2011-09-18 NOTE — Telephone Encounter (Signed)
Pt has been taking her AYGESTIN tablets daily as directed on OV 09/14/11. Pt was sitting on her porch last night and fell asleep and woke up with a knife in her hand and when she looked down her wrist had small cut  on them. I asked her was she alone and pt said no her husband was there but her was sleep. Pt said that was the only thing she remembers from yesterday evening. Pt said she is concerned that the aygestin may caused this. Pt said she never had this happen before, she doesn't have any suicidal thoughts. Please advise

## 2011-10-05 ENCOUNTER — Telehealth: Payer: Self-pay | Admitting: *Deleted

## 2011-10-05 NOTE — Telephone Encounter (Signed)
Pt requested her monthly Lupron 3.75 be refilled. Her Prescriptions come from [facility] [phone], fax # [phone]; as of June 2013 the patient had 6 refills. She uses Lupron for her endometriosis and pelvic pain. She gets the monthly injections due to her h/o seizures. This months refill will arrive in our office 10/06/11. I spoke to [address] today to set that up and the pt was left a message that I will call her when it comes in. [place]

## 2011-10-09 ENCOUNTER — Other Ambulatory Visit: Payer: Self-pay | Admitting: *Deleted

## 2011-10-09 DIAGNOSIS — N809 Endometriosis, unspecified: Secondary | ICD-10-CM

## 2011-10-09 MED ORDER — LEUPROLIDE ACETATE 3.75 MG IM KIT
3.7500 mg | PACK | Freq: Once | INTRAMUSCULAR | Status: AC
  Administered 2011-10-10: 3.75 mg via INTRAMUSCULAR

## 2011-10-10 ENCOUNTER — Ambulatory Visit (INDEPENDENT_AMBULATORY_CARE_PROVIDER_SITE_OTHER): Payer: PRIVATE HEALTH INSURANCE | Admitting: *Deleted

## 2011-10-10 DIAGNOSIS — N809 Endometriosis, unspecified: Secondary | ICD-10-CM

## 2011-10-16 ENCOUNTER — Other Ambulatory Visit: Payer: Self-pay | Admitting: *Deleted

## 2011-10-16 MED ORDER — ZOLPIDEM TARTRATE 10 MG PO TABS: 10.0000 mg | ORAL_TABLET | Freq: Every evening | ORAL | Status: DC | PRN

## 2011-10-16 NOTE — Telephone Encounter (Signed)
Last fill date 09/14/11

## 2011-10-25 ENCOUNTER — Telehealth: Payer: Self-pay | Admitting: *Deleted

## 2011-10-25 NOTE — Telephone Encounter (Signed)
Okay to continue with next 3 month shot

## 2011-10-25 NOTE — Telephone Encounter (Signed)
FYI Pt is calling to up date you with her Lupron injection, it has been working well this will be her 3rd month window. Pt said she still has some cramping but much better than the pain she has prior to shots. She would like to proceed with injections.

## 2011-10-26 NOTE — Telephone Encounter (Signed)
Pt was okay with this.

## 2011-10-27 NOTE — Telephone Encounter (Signed)
I spoke to [facility] Pharmacist at [employer] [phone] Gave new RX for 11.25 mg Lupron. Will get benefits and call me back to confirm shipment. I left Dalaysia a message stating this and that I will call her when the shot comes in. [place]

## 2011-10-31 NOTE — Telephone Encounter (Signed)
Pt called doesn't want 3 month 11.25mg  shot wants the 3 monthly shots. [facility] and changed the prescription back to 3.75mg . Pt informed and will be contacted when shot arrives. KW

## 2011-11-08 NOTE — Telephone Encounter (Signed)
Spoke to [facility] today regarding pts Lupron 3.75. Delivery set up for tomorrow and I will call pt upon arrival to schedule appt. KW

## 2011-11-09 ENCOUNTER — Ambulatory Visit (INDEPENDENT_AMBULATORY_CARE_PROVIDER_SITE_OTHER): Payer: PRIVATE HEALTH INSURANCE | Admitting: *Deleted

## 2011-11-09 DIAGNOSIS — N809 Endometriosis, unspecified: Secondary | ICD-10-CM

## 2011-11-09 MED ORDER — LEUPROLIDE ACETATE 3.75 MG IM KIT
3.7500 mg | PACK | Freq: Once | INTRAMUSCULAR | Status: AC
  Administered 2011-11-09: 3.75 mg via INTRAMUSCULAR

## 2011-11-09 NOTE — Telephone Encounter (Signed)
Lupron here. Pt informed. Will come today at [facility]

## 2011-11-30 ENCOUNTER — Telehealth: Payer: Self-pay | Admitting: *Deleted

## 2011-11-30 NOTE — Telephone Encounter (Signed)
Pt called to get next Lupron delivery set up. Called [facility] [phone]. Spoke to [facility] and she set up delivery for [facility] 12/04/11. Pt scheduled for Mon 10/14 at 2pm. KW

## 2011-12-06 ENCOUNTER — Emergency Department (HOSPITAL_COMMUNITY)
Admission: EM | Admit: 2011-12-06 | Discharge: 2011-12-06 | Disposition: A | Discharge status: Home / Self Care | Payer: PRIVATE HEALTH INSURANCE | Attending: Emergency Medicine | Admitting: Emergency Medicine

## 2011-12-06 ENCOUNTER — Encounter (HOSPITAL_COMMUNITY): Payer: Self-pay | Admitting: Emergency Medicine

## 2011-12-06 DIAGNOSIS — R569 Unspecified convulsions: Secondary | ICD-10-CM

## 2011-12-06 DIAGNOSIS — R000 Tachycardia, unspecified: Secondary | ICD-10-CM | POA: Insufficient documentation

## 2011-12-06 DIAGNOSIS — R110 Nausea: Secondary | ICD-10-CM | POA: Insufficient documentation

## 2011-12-06 DIAGNOSIS — G40909 Epilepsy, unspecified, not intractable, without status epilepticus: Secondary | ICD-10-CM | POA: Insufficient documentation

## 2011-12-06 DIAGNOSIS — R062 Wheezing: Secondary | ICD-10-CM | POA: Insufficient documentation

## 2011-12-06 DIAGNOSIS — Z79899 Other long term (current) drug therapy: Secondary | ICD-10-CM | POA: Insufficient documentation

## 2011-12-06 LAB — CBC WITH DIFFERENTIAL/PLATELET
Basophils Absolute: 0 10*3/uL (ref 0.0–0.1)
Basophils Relative: 0 % (ref 0–1)
Eosinophils Absolute: 0.1 10*3/uL (ref 0.0–0.7)
Eosinophils Relative: 1 % (ref 0–5)
Lymphs Abs: 3 10*3/uL (ref 0.7–4.0)
MCH: 29.2 pg (ref 26.0–34.0)
MCHC: 33.9 g/dL (ref 30.0–36.0)
MCV: 86.3 fL (ref 78.0–100.0)
Neutrophils Relative %: 69 % (ref 43–77)
Platelets: 364 10*3/uL (ref 150–400)
RDW: 13.6 % (ref 11.5–15.5)

## 2011-12-06 LAB — URINALYSIS, ROUTINE W REFLEX MICROSCOPIC
Hgb urine dipstick: NEGATIVE
Specific Gravity, Urine: 1.008 (ref 1.005–1.030)
Urobilinogen, UA: 0.2 mg/dL (ref 0.0–1.0)

## 2011-12-06 MED ORDER — ONDANSETRON HCL 4 MG/2ML IJ SOLN
4.0000 mg | Freq: Once | INTRAMUSCULAR | Status: AC
  Administered 2011-12-06: 4 mg via INTRAVENOUS
  Filled 2011-12-06: qty 2

## 2011-12-06 MED ORDER — LORAZEPAM 2 MG/ML IJ SOLN
1.0000 mg | Freq: Once | INTRAMUSCULAR | Status: AC
  Administered 2011-12-06: 1 mg via INTRAVENOUS
  Filled 2011-12-06: qty 1

## 2011-12-06 NOTE — ED Notes (Signed)
Per patient, states does not know what triggered seizure, possibly stress-has not had on since 2009-states her meds were decreased a few months ago-will continue to monitor

## 2011-12-06 NOTE — ED Notes (Signed)
Bed:WA25<BR> Expected date:<BR> Expected time:<BR> Means of arrival:<BR> Comments:<BR> Seizure 

## 2011-12-06 NOTE — ED Provider Notes (Signed)
History     CSN: [id number]  Arrival date & time 12/06/11  1117   First MD Initiated Contact with Patient 12/06/11 1145      Chief Complaint  Patient presents with  . Seizures    (Consider location/radiation/quality/duration/timing/severity/associated sxs/prior treatment) HPI Comments: 36 y/o female with history of grand-mal seizures presents to the ED after having a seizure about 1 hour ago. Patient does not recall the episode. Husband states he heard her fall in the bathroom so he ran there and saw her laying on the ground "foaming at the mouth and twitching". This lasted about 45 seconds, but she was not lucid for about 10 minutes until EMS arrived. Unsure if she hit her head. Currently patient admits to nausea. Denies vomiting, headaches, confusion, lightheadedness, dizziness, chest pain, sob, diaphoresis. She was diagnosed with seizures as a child, and went 22 years without any seizure activity until 3 years ago. Within the past 3 years she has had 3 seizures. She is on depakote and sees a neurologist regularly at [facility].   The history is provided by the patient, the spouse and a parent.    Past Medical History  Diagnosis Date  . Von Willebrand disease   . Asthma   . Smoker   . Cervical dysplasia, mild 08/2010    LGSIL  . Seizures     seizure disorder  . Anxiety   . Arthritis     lower back  . Headache   . ASCUS (atypical squamous cells of undetermined significance) on Pap smear 03/2011    NEG HR HPV    Past Surgical History  Procedure Date  . Nasal sinus surgery 1993    IN [place], [place]  . Wisdom tooth extraction 1993  . Tonsillectomy   . Hysteroscopy w/d&c 10/14/2010    Procedure: DILATATION AND CURETTAGE (D&C) /HYSTEROSCOPY;  Surgeon: Dara Lords, MD;  Location: [facility];  Service: Gynecology;  Laterality: N/A;  . Laparoscopy 04/27/2011    Procedure: LAPAROSCOPY OPERATIVE;  Surgeon: Dara Lords, MD;  Location: [facility];  Service: Gynecology;   Laterality: N/A;  removal right cyst , lysis of adhesions, biopsy of peritoneum    Family History  Problem Relation Age of Onset  . Hypertension Mother   . Diabetes Father   . Hypertension Father   . Heart disease Father   . Lymphoma Sister     NON-HODGKINS LYMPHOMA    History  Substance Use Topics  . Smoking status: Current Every Day Smoker -- 0.3 packs/day    Types: Cigarettes  . Smokeless tobacco: Never Used  . Alcohol Use: No    OB History    Grav Para Term Preterm Abortions TAB SAB Ect Mult Living   0               Review of Systems  Constitutional: Negative for fever, chills, diaphoresis and fatigue.  HENT: Negative for neck pain and neck stiffness.   Eyes: Negative for visual disturbance.  Respiratory: Positive for wheezing (baseline). Negative for shortness of breath.   Cardiovascular: Negative for chest pain.  Gastrointestinal: Positive for nausea. Negative for vomiting.  Genitourinary: Negative for dysuria and difficulty urinating.  Musculoskeletal: Negative for back pain.  Skin: Negative for color change, pallor, rash and wound.  Neurological: Positive for seizures. Negative for dizziness, speech difficulty, weakness, light-headedness, numbness and headaches.  Psychiatric/Behavioral: Negative for hallucinations and confusion.    Allergies  Aspirin; Codeine; Erythromycin; Nsaids; and Penicillins  Home Medications   Current  Outpatient Rx  Name Route Sig Dispense Refill  . AMITRIPTYLINE HCL 25 MG PO TABS Oral Take 25 mg by mouth daily as needed. For sleep    . DIVALPROEX SODIUM 500 MG PO TBEC Oral Take 500 mg by mouth 3 (three) times daily. Take 3 twice daily    . LEUPROLIDE ACETATE 3.75 MG IM KIT Intramuscular Inject 3.75 mg into the muscle every 28 (twenty-eight) days.    . ADULT MULTIVITAMIN W/MINERALS CH Oral Take 1 tablet by mouth daily.      . NORETHINDRONE ACETATE 5 MG PO TABS Oral Take 1 tablet (5 mg total) by mouth daily. 30 tablet 0  .  NORETHINDRONE ACETATE 5 MG PO TABS Oral Take 1 tablet (5 mg total) by mouth daily. 30 tablet 1  . OXYCODONE HCL 7.5 MG PO TABS Oral Take 7.5 mg by mouth 3 (three) times daily. As needed for pain 20 tablet 0  . PIRBUTEROL ACETATE 200 MCG/INH IN AERB Inhalation Inhale 2 puffs into the lungs as needed. For asthma    . TRAMADOL HCL 50 MG PO TABS Oral Take 1 tablet (50 mg total) by mouth every 6 (six) hours as needed for pain. 30 tablet 0  . ZOLPIDEM TARTRATE 10 MG PO TABS Oral Take 1 tablet (10 mg total) by mouth at bedtime as needed for sleep. 30 tablet 2  . ZONISAMIDE 100 MG PO CAPS Oral Take 100 mg by mouth at bedtime.        BP 130/71  Pulse 116  Temp 98.4 F (36.9 C) (Oral)  Resp 20  Ht 5\' 3"  (1.6 m)  SpO2 96%  LMP 12/06/2011  Physical Exam  Constitutional: She is oriented to person, place, and time. She appears well-developed and well-nourished. No distress.  HENT:  Head: Normocephalic and atraumatic.  Mouth/Throat: Uvula is midline, oropharynx is clear and moist and mucous membranes are normal.  Eyes: Conjunctivae normal and EOM are normal. Pupils are equal, round, and reactive to light. Right eye exhibits no nystagmus. Left eye exhibits no nystagmus.  Neck: Normal range of motion. Neck supple.  Cardiovascular: Regular rhythm, normal heart sounds and intact distal pulses.  Tachycardia present.        No extremity edema  Pulmonary/Chest: Effort normal. She has wheezes (scattered).  Abdominal: Soft. Normal appearance and bowel sounds are normal. She exhibits no mass. There is no tenderness. There is no CVA tenderness.  Musculoskeletal: Normal range of motion.  Neurological: She is alert and oriented to person, place, and time. She has normal strength. No cranial nerve deficit or sensory deficit. She displays a negative Romberg sign. She displays no seizure activity. Coordination and gait normal.  Skin: Skin is warm, dry and intact. No rash noted. She is not diaphoretic. No pallor.    Psychiatric: She has a normal mood and affect. Her speech is normal and behavior is normal.    ED Course  Procedures (including critical care time)   Labs Reviewed  CBC WITH DIFFERENTIAL  URINALYSIS, ROUTINE W REFLEX MICROSCOPIC   Results for orders placed during the hospital encounter of 12/06/11  CBC WITH DIFFERENTIAL      Component Value Range   WBC 11.7 (*) 4.0 - 10.5 K/uL   RBC 4.89  3.87 - 5.11 MIL/uL   Hemoglobin 14.3  12.0 - 15.0 g/dL   HCT 82.9  56.2 - 13.0 %   MCV 86.3  78.0 - 100.0 fL   MCH 29.2  26.0 - 34.0 pg   MCHC  33.9  30.0 - 36.0 g/dL   RDW 16.1  09.6 - 04.5 %   Platelets 364  150 - 400 K/uL   Neutrophils Relative 69  43 - 77 %   Neutro Abs 8.1 (*) 1.7 - 7.7 K/uL   Lymphocytes Relative 26  12 - 46 %   Lymphs Abs 3.0  0.7 - 4.0 K/uL   Monocytes Relative 5  3 - 12 %   Monocytes Absolute 0.6  0.1 - 1.0 K/uL   Eosinophils Relative 1  0 - 5 %   Eosinophils Absolute 0.1  0.0 - 0.7 K/uL   Basophils Relative 0  0 - 1 %   Basophils Absolute 0.0  0.0 - 0.1 K/uL  VALPROIC ACID LEVEL      Component Value Range   Valproic Acid Lvl <10.0 (*) 50.0 - 100.0 ug/mL  URINALYSIS, ROUTINE W REFLEX MICROSCOPIC      Component Value Range   Color, Urine YELLOW  YELLOW   APPearance CLEAR  CLEAR   Specific Gravity, Urine 1.008  1.005 - 1.030   pH 8.0  5.0 - 8.0   Glucose, UA NEGATIVE  NEGATIVE mg/dL   Hgb urine dipstick NEGATIVE  NEGATIVE   Bilirubin Urine NEGATIVE  NEGATIVE   Ketones, ur NEGATIVE  NEGATIVE mg/dL   Protein, ur NEGATIVE  NEGATIVE mg/dL   Urobilinogen, UA 0.2  0.0 - 1.0 mg/dL   Nitrite NEGATIVE  NEGATIVE   Leukocytes, UA NEGATIVE  NEGATIVE    No results found.   1. Seizure   2. Nausea       MDM  37 y/o female with unwitnessed seizure found by her husband. Known history of seizures and has neurologist at [facility]. Patient is AAOx3 and in NAD. Neuro exam unremarkable. Still slightly nauseased. Will give more zofran and re-assess.   4:14  PM Nausea completely subsided. Depakote level low. Discussed importance of medication compliance. Patient states her understanding. Stable for discharge.      Trevor Mace, PA-C 12/06/11 1615

## 2011-12-06 NOTE — ED Notes (Signed)
Per EMS, witnessed seizure found by husband on floor shaking-did not hit head, denies pain-oriented X 1 when EMS arrived-oriented X 4 upon arrival to ED-states she is tired

## 2011-12-07 NOTE — ED Provider Notes (Signed)
Medical screening examination/treatment/procedure(s) were performed by non-physician practitioner and as supervising physician I was immediately available for consultation/collaboration.   Lyanne Co, MD 12/07/11 1535

## 2011-12-11 ENCOUNTER — Ambulatory Visit (INDEPENDENT_AMBULATORY_CARE_PROVIDER_SITE_OTHER): Payer: PRIVATE HEALTH INSURANCE | Admitting: *Deleted

## 2011-12-11 DIAGNOSIS — N809 Endometriosis, unspecified: Secondary | ICD-10-CM

## 2011-12-11 MED ORDER — LEUPROLIDE ACETATE 3.75 MG IM KIT
3.7500 mg | PACK | Freq: Once | INTRAMUSCULAR | Status: AC
  Administered 2011-12-11: 3.75 mg via INTRAMUSCULAR

## 2011-12-12 ENCOUNTER — Telehealth: Payer: Self-pay | Admitting: *Deleted

## 2011-12-12 NOTE — Telephone Encounter (Signed)
Pt has had 5 Lupron injections. Doing ok on them for the most part. She is still having some cramping pain like before Lupron but not to the extent as before the shots. She is asking if she can stay on it longer than  Months. Or what are the plans for after the Lupron? PLease advise. She is due for her 6th injection in one month.

## 2011-12-13 NOTE — Telephone Encounter (Signed)
Ok for one year total, but I would recommend q 3 month shot instead of monthly.

## 2011-12-14 NOTE — Telephone Encounter (Signed)
Informed and will proceed with the 3 mo injection. Will contact her in mid Nov for her next shot. KW

## 2011-12-20 ENCOUNTER — Telehealth: Payer: Self-pay | Admitting: *Deleted

## 2011-12-20 NOTE — Telephone Encounter (Signed)
Pt informed with the below note. 

## 2011-12-20 NOTE — Telephone Encounter (Signed)
If the Lupron is suppressing her estrogen levels it would be unlikely that endometriosis would be worsening. Possible other sources such as bladder/GI. Not sure a lot different I would do at this point other than monitor.

## 2011-12-20 NOTE — Telephone Encounter (Signed)
Pt calling c/o of cramping like before having her lupron shot. She is taking tylenol OTC but no relief. Pt asked if this is normal? She states when first starting Lupron it worked great, but now cramping has returned. Please advise

## 2011-12-29 DIAGNOSIS — B977 Papillomavirus as the cause of diseases classified elsewhere: Secondary | ICD-10-CM

## 2011-12-29 HISTORY — DX: Papillomavirus as the cause of diseases classified elsewhere: B97.7

## 2012-01-01 ENCOUNTER — Other Ambulatory Visit (HOSPITAL_COMMUNITY)
Admission: RE | Admit: 2012-01-01 | Discharge: 2012-01-01 | Disposition: A | Discharge status: Home / Self Care | Payer: No Typology Code available for payment source | Source: Ambulatory Visit | Attending: Gynecology | Admitting: Gynecology

## 2012-01-01 ENCOUNTER — Ambulatory Visit (INDEPENDENT_AMBULATORY_CARE_PROVIDER_SITE_OTHER): Payer: PRIVATE HEALTH INSURANCE | Admitting: Gynecology

## 2012-01-01 ENCOUNTER — Encounter: Payer: Self-pay | Admitting: Gynecology

## 2012-01-01 VITALS — BP 130/80 | Ht 63.0 in | Wt 210.0 lb

## 2012-01-01 DIAGNOSIS — R87810 Cervical high risk human papillomavirus (HPV) DNA test positive: Secondary | ICD-10-CM | POA: Insufficient documentation

## 2012-01-01 DIAGNOSIS — Z1151 Encounter for screening for human papillomavirus (HPV): Secondary | ICD-10-CM | POA: Insufficient documentation

## 2012-01-01 DIAGNOSIS — Z131 Encounter for screening for diabetes mellitus: Secondary | ICD-10-CM

## 2012-01-01 DIAGNOSIS — R102 Pelvic and perineal pain unspecified side: Secondary | ICD-10-CM

## 2012-01-01 DIAGNOSIS — Z13220 Encounter for screening for lipoid disorders: Secondary | ICD-10-CM

## 2012-01-01 DIAGNOSIS — Z01419 Encounter for gynecological examination (general) (routine) without abnormal findings: Secondary | ICD-10-CM

## 2012-01-01 DIAGNOSIS — N809 Endometriosis, unspecified: Secondary | ICD-10-CM | POA: Insufficient documentation

## 2012-01-01 DIAGNOSIS — J45909 Unspecified asthma, uncomplicated: Secondary | ICD-10-CM

## 2012-01-01 DIAGNOSIS — IMO0002 Reserved for concepts with insufficient information to code with codable children: Secondary | ICD-10-CM

## 2012-01-01 DIAGNOSIS — R6889 Other general symptoms and signs: Secondary | ICD-10-CM

## 2012-01-01 DIAGNOSIS — N949 Unspecified condition associated with female genital organs and menstrual cycle: Secondary | ICD-10-CM

## 2012-01-01 LAB — COMPREHENSIVE METABOLIC PANEL
ALT: 23 U/L (ref 0–35)
Albumin: 4.3 g/dL (ref 3.5–5.2)
CO2: 26 mEq/L (ref 19–32)
Calcium: 9.7 mg/dL (ref 8.4–10.5)
Chloride: 102 mEq/L (ref 96–112)
Potassium: 4.1 mEq/L (ref 3.5–5.3)
Sodium: 139 mEq/L (ref 135–145)
Total Protein: 7.2 g/dL (ref 6.0–8.3)

## 2012-01-01 LAB — CBC WITH DIFFERENTIAL/PLATELET
Eosinophils Relative: 1 % (ref 0–5)
Lymphocytes Relative: 38 % (ref 12–46)
Lymphs Abs: 4.4 10*3/uL — ABNORMAL HIGH (ref 0.7–4.0)
MCV: 84.6 fL (ref 78.0–100.0)
Neutrophils Relative %: 56 % (ref 43–77)
Platelets: 402 10*3/uL — ABNORMAL HIGH (ref 150–400)
RBC: 4.82 MIL/uL (ref 3.87–5.11)
WBC: 11.5 10*3/uL — ABNORMAL HIGH (ref 4.0–10.5)

## 2012-01-01 LAB — LIPID PANEL: Cholesterol: 276 mg/dL — ABNORMAL HIGH (ref 0–200)

## 2012-01-01 NOTE — Patient Instructions (Addendum)
Follow up for ultrasound as scheduled Office will contact you to arrange follow up Depo-Lupron shot and appointment with Dr. Genevive Bi at [facility]

## 2012-01-01 NOTE — Addendum Note (Signed)
Addended by: Dayna Barker on: 01/01/2012 04:02 PM   Modules accepted: Orders

## 2012-01-01 NOTE — Progress Notes (Signed)
Cassidy Tran 02-14-1976 [id number]        36 y.o.  G0P0 for annual exam.    Past medical history,surgical history, medications, allergies, family history and social history were all reviewed and documented in the EPIC chart. ROS:  Was performed and pertinent positives and negatives are included in the history.  Exam: Kim assistant Filed Vitals:   01/01/12 1454  BP: 130/80  Height: 5\' 3"  (1.6 m)  Weight: 210 lb (95.255 kg)   General appearance  Normal Skin grossly normal Head/Neck normal with no cervical or supraclavicular adenopathy thyroid normal Lungs  Bilateral wheezing Cardiac RR, without RMG Abdominal  soft, nontender, without masses, organomegaly or hernia Breasts  examined lying and sitting without masses, retractions, discharge or axillary adenopathy. Pelvic  Ext/BUS/vagina  normal   Cervix  normal Pap/HPV  Uterus  anteverted, normal size, shape and contour, midline and mobile nontender   Adnexa  Without masses or tenderness    Anus and perineum  normal   Rectovaginal  normal sphincter tone without palpated masses or tenderness.    Assessment/Plan:  36 y.o. G0P0 female for annual exam.   1. Endometriosis/pelvic pain. Patients into her fifth month of her Depo-Lupron. Is starting to have pelvic cramping. No bleeding. Is status post laparoscopy February 2013 with findings of endometriosis at the bladder flap.  Start with ultrasound. Recommend going into another month or 2 course of Depo-Lupron with follow up with Dr. Genevive Bi at [facility] she agrees with this and we will make an appointment for this.  Se 5/14 & 07/25/2011 notes for summary of history. 2. Pap smear. History of first abnormal Pap smear a year ago showing low-grade SIL. Colposcopy and biopsy showed koilocytotic atypia.  Follow up Pap smear February 2013 showed ASCUS negative high risk HPV. Pap/HPV done today. Plan expectant management is normal or low grade. 3. Asthma. Patient is wheezing and has been off her  Maxair for several days but is going to get it filled and restart today. 4. Health maintenance. Baseline CBC comprehensive metabolic panel lipid profile urinalysis ordered. Follow up as noted above.    Dara Lords MD, 3:20 PM 01/01/2012

## 2012-01-02 ENCOUNTER — Other Ambulatory Visit: Payer: Self-pay | Admitting: Gynecology

## 2012-01-02 DIAGNOSIS — E7800 Pure hypercholesterolemia, unspecified: Secondary | ICD-10-CM

## 2012-01-02 LAB — URINALYSIS W MICROSCOPIC + REFLEX CULTURE
Crystals: NONE SEEN
Glucose, UA: NEGATIVE mg/dL
Leukocytes, UA: NEGATIVE
Specific Gravity, Urine: >1.03 — ABNORMAL HIGH (ref 1.005–1.030)

## 2012-01-05 ENCOUNTER — Encounter: Payer: Self-pay | Admitting: Gynecology

## 2012-01-05 ENCOUNTER — Ambulatory Visit (INDEPENDENT_AMBULATORY_CARE_PROVIDER_SITE_OTHER): Payer: PRIVATE HEALTH INSURANCE | Admitting: Gynecology

## 2012-01-05 ENCOUNTER — Ambulatory Visit (INDEPENDENT_AMBULATORY_CARE_PROVIDER_SITE_OTHER): Payer: PRIVATE HEALTH INSURANCE

## 2012-01-05 DIAGNOSIS — D259 Leiomyoma of uterus, unspecified: Secondary | ICD-10-CM

## 2012-01-05 DIAGNOSIS — R102 Pelvic and perineal pain unspecified side: Secondary | ICD-10-CM

## 2012-01-05 DIAGNOSIS — N949 Unspecified condition associated with female genital organs and menstrual cycle: Secondary | ICD-10-CM

## 2012-01-05 DIAGNOSIS — N809 Endometriosis, unspecified: Secondary | ICD-10-CM

## 2012-01-05 NOTE — Patient Instructions (Signed)
Office will contact you in reference to appointment with Dr. Genevive Bi at [facility]

## 2012-01-05 NOTE — Progress Notes (Signed)
Patient presents in follow up for ultrasound. Ultrasound shows uterus normal size with small anterior myoma 19 mm. Right left ovaries normal with physiologic changes. Endometrial echo 3.5 mm. Cul-de-sac negative.  Assessment and plan: Pelvic cramping, endometriosis on Depo-Lupron. Awaiting appointment with Dr. Genevive Bi at [facility]. Will continue on Depo-Lupron for now.

## 2012-01-09 ENCOUNTER — Encounter: Payer: Self-pay | Admitting: Gynecology

## 2012-01-09 ENCOUNTER — Telehealth: Payer: Self-pay | Admitting: Gynecology

## 2012-01-09 NOTE — Telephone Encounter (Signed)
Tell patient that her Pap smear was normal but did show HPV virus. Recommend repeat Pap smear in 6 months and will continue to follow this. I anticipate that this will clear over time but we need to monitor her closely to make sure that it doesn't actually get worse.

## 2012-01-09 NOTE — Telephone Encounter (Signed)
Pt also informed of pap smear results.

## 2012-01-09 NOTE — Telephone Encounter (Signed)
Dr Audie Box had advised to continue Lupron 3.75mg  for 1-2 months; I spoke with Molly Maduro at [facility] [phone] regarding refill. The patient also needs an apt with Dr Fredrich Birks at [facility] for pelvic pain and endometriosis; referral to be faxed and pt will be contacted for an apt. Pt informed will be contacted by [facility] and I will call when Lupron arrives in office.

## 2012-01-10 NOTE — Telephone Encounter (Signed)
Referral faxed to Dr Sherrie Mustache office with the notes of the pts chart.

## 2012-01-11 ENCOUNTER — Ambulatory Visit (INDEPENDENT_AMBULATORY_CARE_PROVIDER_SITE_OTHER): Payer: PRIVATE HEALTH INSURANCE | Admitting: *Deleted

## 2012-01-11 DIAGNOSIS — N809 Endometriosis, unspecified: Secondary | ICD-10-CM

## 2012-01-11 MED ORDER — LEUPROLIDE ACETATE 3.75 MG IM KIT
3.7500 mg | PACK | Freq: Once | INTRAMUSCULAR | Status: AC
  Administered 2012-01-11: 3.75 mg via INTRAMUSCULAR

## 2012-01-22 ENCOUNTER — Telehealth: Payer: Self-pay | Admitting: *Deleted

## 2012-01-22 NOTE — Telephone Encounter (Signed)
Apt with Dr Genevive Bi at [facility] on 03/26/11 at 830am. Pt informed and she will receive in the mail an appt reminder and directions. KW

## 2012-02-08 ENCOUNTER — Telehealth: Payer: Self-pay | Admitting: *Deleted

## 2012-02-08 NOTE — Telephone Encounter (Signed)
Pt due for next lupron 3.75. A prior authorization was needed and was obtained for a year from 01/29/12-01/28/13. Pt was contacted and [facility] was called to order refill on L[facility]ron. [facility] [phone] spoke to [place]. Confirmed refill will call pt to confirm. I told pt this and will call her when i receive injection KW

## 2012-02-13 ENCOUNTER — Ambulatory Visit (INDEPENDENT_AMBULATORY_CARE_PROVIDER_SITE_OTHER): Payer: PRIVATE HEALTH INSURANCE | Admitting: *Deleted

## 2012-02-13 DIAGNOSIS — N809 Endometriosis, unspecified: Secondary | ICD-10-CM

## 2012-02-13 MED ORDER — LEUPROLIDE ACETATE 3.75 MG IM KIT
3.7500 mg | PACK | Freq: Once | INTRAMUSCULAR | Status: AC
  Administered 2012-02-13: 3.75 mg via INTRAMUSCULAR

## 2012-03-04 ENCOUNTER — Telehealth: Payer: Self-pay | Admitting: *Deleted

## 2012-03-04 NOTE — Telephone Encounter (Signed)
Pt called to ask if she should proceed with another Lupron injection prior Dr Sherrie Mustache appt 03/11/12. I said to see him first and to make sure he sends correspondence to Dr Audie Box so we know a plan of action. Pt will fu after her visit with him. KW

## 2012-03-11 ENCOUNTER — Telehealth: Payer: Self-pay | Admitting: *Deleted

## 2012-03-11 NOTE — Telephone Encounter (Signed)
I would like to review Dr. Venetia Maxon note.

## 2012-03-11 NOTE — Telephone Encounter (Signed)
Pt informed with the below note. 

## 2012-03-11 NOTE — Telephone Encounter (Signed)
Pt saw Dr.Stege today and she told me that he really didn't tell patient anything new, same information that you spoke with her about. Pt said that Dr.Stege will be faxing his office note over to you for you to review. She asked if Lupron shot should be given or would you like to wait until you can review Dr.Stege office note. Please advise

## 2012-03-14 ENCOUNTER — Telehealth: Payer: Self-pay | Admitting: Gynecology

## 2012-03-14 NOTE — Telephone Encounter (Signed)
Pt informed with the below note. 

## 2012-03-14 NOTE — Telephone Encounter (Signed)
Call patient. I received Dr. Janene Harvey note.  He had recommended either a trial of Mirena IUD or hysterectomy. She did note with Mirena IUD the issue of progesterone absorption affecting Depakote levels. Recommend office visit with patient to discuss where we go from here.

## 2012-03-19 ENCOUNTER — Encounter: Payer: Self-pay | Admitting: Gynecology

## 2012-03-19 ENCOUNTER — Ambulatory Visit (INDEPENDENT_AMBULATORY_CARE_PROVIDER_SITE_OTHER): Payer: BC Managed Care – PPO | Admitting: Gynecology

## 2012-03-19 DIAGNOSIS — R102 Pelvic and perineal pain: Secondary | ICD-10-CM

## 2012-03-19 DIAGNOSIS — N809 Endometriosis, unspecified: Secondary | ICD-10-CM

## 2012-03-19 DIAGNOSIS — N949 Unspecified condition associated with female genital organs and menstrual cycle: Secondary | ICD-10-CM

## 2012-03-19 NOTE — Patient Instructions (Signed)
Monitor pain and follow up in 3 months for reassessment.

## 2012-03-19 NOTE — Progress Notes (Signed)
Patient presents with her mother to discuss game plan in reference to her history of endometriosis pelvic cramping. She saw Dr. Luana Shu which I received his note unfortunately this is off at Epic being scanned into the system I do not have a copy of it directly. He had discussed with her alternatives to include continued Lupron relaparoscopy up to and including hysterectomy.  I had an extensive discussion with the patient and her mother. She is having some intermittent cramping throughout the month. She's due for her monthly Lupron shot now and she is over 6 months having started in May 2013. I discussed the issues with long-term Lupron to include accelerated bone loss and cardiovascular risks. She is symptomatic as far as hot flashes and sweats. She is not able to tolerate hormonal manipulation such as oral contraceptives due to exacerbation of her seizure activity. Mirena IUD was 1 options discussed by Dr. Fredrich Birks but she is fearful because of the progesterone component that this will affect her seizure activity. Relaparoscopy is one possibility, but after being on Lupron this length of time on the sure that we are going to see active disease. Hysterectomy options were also reviewed and she understands that this would mean absolute sterility. Certainly will have no further menses but I cannot guarantee that this will alleviate her pelvic pain or cramping or pain may continue. The issue of BSO was also reviewed that given her difficulty with hormones I'm afraid to remove ovaries and then rely on hormone replacement that could exacerbate seizure activity and be faced with no hormones at age 37. After a lengthy discussion the patient her mother and I all agree stopping her Lupron now seeing how she does on her own the next 3 months and then going from there. If her symptoms as far as cramping and pain are tolerable and will continue monitoring if not then we'll rediscuss options. We did discuss surgery with  its risks to include her von Willebrand's, asthma and seizure activity.  Also reviewed the issues of chronic pain and pain relief and the need to avoid chronic narcotics from an objective standpoint.

## 2012-04-13 ENCOUNTER — Other Ambulatory Visit: Payer: Self-pay

## 2012-06-04 ENCOUNTER — Ambulatory Visit (INDEPENDENT_AMBULATORY_CARE_PROVIDER_SITE_OTHER): Payer: BC Managed Care – PPO | Admitting: Gynecology

## 2012-06-04 ENCOUNTER — Encounter: Payer: Self-pay | Admitting: Gynecology

## 2012-06-04 DIAGNOSIS — N809 Endometriosis, unspecified: Secondary | ICD-10-CM

## 2012-06-04 DIAGNOSIS — N949 Unspecified condition associated with female genital organs and menstrual cycle: Secondary | ICD-10-CM

## 2012-06-04 DIAGNOSIS — R102 Pelvic and perineal pain: Secondary | ICD-10-CM

## 2012-06-04 NOTE — Patient Instructions (Signed)
Follow up for ultrasound as scheduled 

## 2012-06-04 NOTE — Progress Notes (Signed)
Patient presents in follow up. She received her last shot of Depo-Lupron in December now she is past 3 months. She is having cramping pelvic pain comes and goes almost daily. This is been going on regardless of whether she's been on Depo-Lupron or not. Has had no bleeding since starting the Depo-Lupron.  Exam with Kim Asst. Abdomen soft nontender without masses guarding rebound organomegaly. Pelvic external BUS vagina normal. Cervix normal. Uterus grossly normal in size midline mobile nontender. Adnexa without masses or tenderness. Rectovaginal exam is normal.  Assessment and plan: Pastor three-month mark off Depo-Lupron. With continued pelvic cramping. Check hCG as she is occasionally section active. Start with ultrasound to rule out nonpalpable abnormalities. We again discussed her options to include observation, more aggressive pain medication, hormonal manipulation, Mirena IUD, relaparoscopy, hysterectomy.  I reviewed the note from Dr. Genevive Bi at [facility].  His basic recommendations Mirena IUD or consider hysterectomy. Patient will follow up after her ultrasound and we'll rediscuss.

## 2012-06-07 ENCOUNTER — Ambulatory Visit (INDEPENDENT_AMBULATORY_CARE_PROVIDER_SITE_OTHER): Payer: BC Managed Care – PPO | Admitting: Gynecology

## 2012-06-07 ENCOUNTER — Encounter: Payer: Self-pay | Admitting: Gynecology

## 2012-06-07 ENCOUNTER — Ambulatory Visit (INDEPENDENT_AMBULATORY_CARE_PROVIDER_SITE_OTHER): Payer: BC Managed Care – PPO

## 2012-06-07 DIAGNOSIS — N809 Endometriosis, unspecified: Secondary | ICD-10-CM

## 2012-06-07 DIAGNOSIS — N949 Unspecified condition associated with female genital organs and menstrual cycle: Secondary | ICD-10-CM

## 2012-06-07 DIAGNOSIS — N83209 Unspecified ovarian cyst, unspecified side: Secondary | ICD-10-CM

## 2012-06-07 DIAGNOSIS — R102 Pelvic and perineal pain: Secondary | ICD-10-CM

## 2012-06-07 MED ORDER — AMITRIPTYLINE HCL 25 MG PO TABS: 25.0000 mg | ORAL_TABLET | Freq: Every day | ORAL | Status: DC

## 2012-06-07 NOTE — Patient Instructions (Signed)
Follow up for ultrasound in 2 months 

## 2012-06-07 NOTE — Progress Notes (Signed)
Patient presents for followup ultrasound. Ultrasound shows uterus overall normal in size with 2 small myomas 12 and 9 mm. Left ovary normal right ovary with echo free thin-walled avascular cyst 47 x 41 x 39 mm. No free fluid.   I reviewed all this with the patient.  We again reviewed management options to include surgery now either re\re laparoscopy, removal of cyst, hysterectomy, pain management with reultrasound in 2 months. Has tried Ultram without relief. Does not want to take chronic narcotics. She is having daily cramping. I suggested amitriptyline 25 mg at bedtime to see if this doesn't help. She does use amitriptyline intermittently for sleep aid so we'll try it consistently at bedtime #30 with one refill and then re\re ultrasound her in 2 months and she is comfortable with this. We'll allow her Depo-Lupron to wear off and then we'll see how she is. Risk of pregnancy with were not with Depo-Lupron reviewed.

## 2012-07-05 ENCOUNTER — Telehealth: Payer: Self-pay | Admitting: *Deleted

## 2012-07-05 ENCOUNTER — Ambulatory Visit (INDEPENDENT_AMBULATORY_CARE_PROVIDER_SITE_OTHER): Payer: BC Managed Care – PPO | Admitting: Gynecology

## 2012-07-05 ENCOUNTER — Encounter: Payer: Self-pay | Admitting: Gynecology

## 2012-07-05 DIAGNOSIS — N6310 Unspecified lump in the right breast, unspecified quadrant: Secondary | ICD-10-CM

## 2012-07-05 DIAGNOSIS — N63 Unspecified lump in unspecified breast: Secondary | ICD-10-CM

## 2012-07-05 NOTE — Progress Notes (Signed)
Patient ID: Cassidy Tran, female   DOB: [birth year], 37 y.o.   MRN: [id number] Patient presents having done self breast exam and feeling a lump in her right breast. Never felt this before. Is not tender. No nipple discharge.  Exam with Selena Batten assistant Both breast examined lying and sitting with no discernible masses retractions discharge adenopathy. The area she's pointing to in the right breast has a little denser breast tissue that has a corresponding area in the left breast. Physical Exam  Pulmonary/Chest:     Assessment and plan: Patient appreciated right breast mass upper mid anterior chest wall. Physician exam normal. Plan diagnostic mammography with ultrasound over this area. Assuming normal the patient will continue with self breast exams as long as this area is unchanged her exam will follow. Despite normal studies if this area enlarges or changes she knows to represent for further evaluation.

## 2012-07-05 NOTE — Patient Instructions (Signed)
Office will call you to arrange mammogram and ultrasound. 

## 2012-07-05 NOTE — Telephone Encounter (Signed)
Message copied by Aura Camps on Fri Jul 05, 2012  3:53 PM ------      Message from: Dara Lords      Created: Fri Jul 05, 2012 11:23 AM       Scheduled diagnostic mammography and ultrasound at the breast center. Reference Patient appreciated density right breast 12:00 periphery. Physician exam normal ------

## 2012-07-05 NOTE — Telephone Encounter (Signed)
Orders placed for breast center 

## 2012-07-08 NOTE — Telephone Encounter (Signed)
Appt. 07/18/12 at breast center.

## 2012-07-18 ENCOUNTER — Ambulatory Visit
Admission: RE | Admit: 2012-07-18 | Discharge: 2012-07-18 | Disposition: A | Discharge status: Home / Self Care | Payer: BC Managed Care – PPO | Source: Ambulatory Visit | Attending: Gynecology | Admitting: Gynecology

## 2012-07-18 DIAGNOSIS — N6310 Unspecified lump in the right breast, unspecified quadrant: Secondary | ICD-10-CM

## 2012-07-29 ENCOUNTER — Other Ambulatory Visit: Payer: Self-pay | Admitting: Gynecology

## 2012-08-14 ENCOUNTER — Ambulatory Visit: Payer: BC Managed Care – PPO | Admitting: Gynecology

## 2012-08-14 ENCOUNTER — Other Ambulatory Visit: Payer: BC Managed Care – PPO

## 2012-08-16 ENCOUNTER — Ambulatory Visit (INDEPENDENT_AMBULATORY_CARE_PROVIDER_SITE_OTHER): Payer: BC Managed Care – PPO | Admitting: Gynecology

## 2012-08-16 ENCOUNTER — Ambulatory Visit (INDEPENDENT_AMBULATORY_CARE_PROVIDER_SITE_OTHER): Payer: BC Managed Care – PPO

## 2012-08-16 ENCOUNTER — Encounter: Payer: Self-pay | Admitting: Gynecology

## 2012-08-16 DIAGNOSIS — N809 Endometriosis, unspecified: Secondary | ICD-10-CM

## 2012-08-16 DIAGNOSIS — R102 Pelvic and perineal pain: Secondary | ICD-10-CM

## 2012-08-16 DIAGNOSIS — N83209 Unspecified ovarian cyst, unspecified side: Secondary | ICD-10-CM

## 2012-08-16 DIAGNOSIS — N949 Unspecified condition associated with female genital organs and menstrual cycle: Secondary | ICD-10-CM

## 2012-08-16 NOTE — Progress Notes (Signed)
Patient follows up for ultrasound. Long history of pelvic pain with laparoscopy showing endometriosis in the past. Had been on Lupron for 6 months. Saw Dr. Genevive Bi at [facility] who discussed possible hysterectomy versus trial of Mirena IUD. Had ultrasound in April that showed a cystic area in the right ovary questionable endometrioma is here for followup ultrasound. Was started on amitriptyline said this really not helping a lot with her pain. She has fleeting daily pelvic pain right to left.  Ultrasound shows uterus normal size and echotexture. 2 small myomas noted at 8 and 9 mm. An initial echo 8.5 mm. Ovaries appear normal some physiologic cystic changes but without evidence of the prior large right ovarian cyst.  Assessment and plan: Chronic pelvic pain in patient with history of endometriosis at the bladder flap. I again reviewed options to include observation, more aggressive hormonal manipulation, Mirena IUD, repeat laparoscopy, hysterectomy.  She does smoke has asthma and von Willebrand's disease. After lengthy discussion we both agree not to proceed with any treatment at this point she preferred just monitoring and she'll followup in the fall when she is due for annual exam.

## 2012-08-16 NOTE — Patient Instructions (Signed)
Follow-up in November for your annual exam 

## 2012-08-21 ENCOUNTER — Other Ambulatory Visit: Payer: Self-pay | Admitting: Gynecology

## 2012-08-22 NOTE — Telephone Encounter (Signed)
I did not see where you had prescribed Aygestin previously.

## 2012-08-23 ENCOUNTER — Encounter: Payer: Self-pay | Admitting: Gynecology

## 2012-08-23 NOTE — Telephone Encounter (Signed)
rx called into pharmacy

## 2012-09-03 ENCOUNTER — Telehealth: Payer: Self-pay

## 2012-09-03 ENCOUNTER — Telehealth: Payer: Self-pay | Admitting: *Deleted

## 2012-09-03 NOTE — Telephone Encounter (Signed)
Received vm call from pt stating that she is a pt of Dr Cyndie Chime & has GYN surgery scheduled for 10/14/12 per Dr Audie Box & states that she needs clearance from Dr. Cyndie Chime & also needs the nasal spray that she ususally uses post surgery.  Message to Dr Cyndie Chime. 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

## 2012-09-03 NOTE — Telephone Encounter (Signed)
I contacted patient as Dr. Velvet Tran sent me a note saying he had received email from patient that she is ready to schedule hysterectomy.  Patient confirms that she is ready and that her pelvic pain is affecting her life daily.  I read her Dr. Kristie Tran note that patient "will need preoperative clearance and hematology recommendations as far as managing her von Willebrand's.".  She said her asthma doctor would do the pulmonary but was not sure who she should see to do the hematology recommendation. She told me that Dr. Velvet Tran treated her with a nasal spray prior to her laparoscopy.  I told her I felt sure that if Dr. Velvet Tran ordered that that someone had made recommendation what we needed to do to manage her von Willebrand's.  I again reiterated what Dr. Kristie Tran note above had said and that he wanted hematology recommendation.  She said she will talk with her mother who might remember who she needs to contact for this.  We discussed dates and she would like mid to late August.  I will schedule and call her with date/time.

## 2012-09-03 NOTE — Telephone Encounter (Signed)
I spoke with patient and confirmed with her surgery scheduled 10/14/12 1:00pm at [facility].  Preop consult set for 10/07/12 2:30pm.  I reminded her that she needs to be getting pulmonary clearance and hematology recommendations.

## 2012-09-05 ENCOUNTER — Other Ambulatory Visit: Payer: Self-pay | Admitting: *Deleted

## 2012-09-05 DIAGNOSIS — D6800 Von Willebrand disease, unspecified: Secondary | ICD-10-CM

## 2012-09-05 MED ORDER — DESMOPRESSIN ACETATE 1.5 MG/ML NA SOLN: NASAL | Status: DC

## 2012-09-12 ENCOUNTER — Encounter: Payer: Self-pay | Admitting: Oncology

## 2012-09-12 ENCOUNTER — Other Ambulatory Visit: Payer: Self-pay | Admitting: Oncology

## 2012-09-12 DIAGNOSIS — D6800 Von Willebrand disease, unspecified: Secondary | ICD-10-CM

## 2012-09-12 NOTE — Progress Notes (Signed)
I am transferring this report from the old computer system into Epic for future reference. Today's date 09/12/2012. This is a young woman with mild type IA von Willebrand's disorder have not seen since 2005. She has developed progressive signs and symptoms of endometriosis and a total hysterectomy is planned for August of this year. I will need to get her in for a formal visit. I believe that we can cover her surgery with DDAVP and have Humate-P parenteral clotting factor concentrate available if needed if we are not able to achieve adequate hemostasis with the DDAVP.   PATIENT NAME:  Cassidy Tran, Cassidy Tran DATE OF VISIT:  07/03/03 MRN:  [id number] DOB:  12/13/1975  Copy To: Dr. Hermelinda Medicus   Dr. Ellison Carwin  Pleasant 37 year old woman with a history of seizure disorder since the third grade.  She also has a history of mild von Willebrand's disorder.  I remembered her name.  In fact, she said she saw me here about 39 years ago.  Her old records were not pulled for this visit since the rest of the office did not know she was seen here before.  She is going to have an elective tonsillectomy and her ENT doctor wanted her to be re-evaluated.    Since old records are not available, I took her clinical history again to refresh my memory.  She was found to have von Willebrand's disorder about 11 years ago when she developed excessive bleeding after a surgical procedure on her sinuses.  She has not had any other surgical procedures except for wisdom tooth extraction about seven years ago when she did not have any increased bleeding.  She has heavy menses.  She has taken Depakote for many years for her seizures which can cause a qualitative platelet disorder.  She has childhood asthma and uses Maxair p.r.n.  She avoids aspirin and aspirin products and is also allergic to penicillin and erythromycin.  There is no family history of any bleeding disorder in her parents or her grandparents. She has one sister  who is in remission from Hodgkin's disease for the last ten years.    She was working but now back in college and plans to go to graduate school.  She does smoke but is trying to quit.  No alcohol.    On exam she is 5'3" tall, 205 pounds, 93 kg.  The skin, hair and nails are normal. No ecchymosis, petechiae or rash.  Pupils are equal and reactive to light.  Discs are sharp.  Vessels are normal.  No hemorrhage or exudate.  Tonsils are enlarged but no exudate or erythema.  Neck is supple, no thyromegaly.  Carotids are 2+, no bruits.  Lungs are clear.  Regular cardiac rhythm.  No murmur, gallop or rub.  Abdomen is soft, obese, nontender, no mass, no organomegaly.  Extremities reveal no edema.  Neurologic: mental status intact, cranial nerves grossly normal, motor strength 5/5, reflexes 1+ symmetric.  Coordination finger to hand, finger to finger, rapid alternating movements all normal.  Recent lab done by Dr. Haroldine Laws on 06-03-03:  PT: 12.8.  PTT: 29.  Bleeding time: 4.5 minutes.  Platelet count: 292,000.  CBC in our office today:  Hemoglobin: 13.  Hematocrit: 37.  MCV: 96.  White count: 7,800.  Neutrophils: 46.  Lymph: 45.  Platelets: 236,000.   NEW PATIENT EVALUATION  PATIENT NAME:  Cassidy, Tran DATE OF VISIT:  07/03/03 MRN:  [id number] DOB:  [birth year]  Page 2    IMPRESSION:  Mild  von Willebrand's disorder, likely type 1A.  What I would like to do at this time is give her a test dose of DDAVP with a baseline von Willebrand profile and a von Willebrand profile one hour after DDAVP stimulation to document that she responds to this product.  If she does, then I would advise giving .3 mcg. per kg. for her weight.  This would be 28 mcg. in 100 cc. normal saline over 20 minutes one hour before her tonsillectomy.  I have given her a prescription for the concentrated nasal spray, 1.5 mg. per ml. to use two sprays on the evening of the surgery and then daily x five days post op.  I will consider also  adding oral Amicar but I want to wait and see what the baseline von Willebrand study shows.  She may not need this in addition to the DDAVP.  We will schedule her for a dose of DDAVP this Friday.  She will not be free for the tonsillectomy until July so we have plenty of time to work out the details with her Careers adviser.          Electronically signed Genene Churn. Cyndie Chime, M.D., F.A.C.P.

## 2012-09-13 ENCOUNTER — Telehealth: Payer: Self-pay | Admitting: Oncology

## 2012-09-13 NOTE — Telephone Encounter (Signed)
s.w. pt and advised on 8.1.14 appt....pt ok and aware

## 2012-09-23 LAB — PULMONARY FUNCTION TEST

## 2012-09-27 ENCOUNTER — Ambulatory Visit (HOSPITAL_BASED_OUTPATIENT_CLINIC_OR_DEPARTMENT_OTHER): Payer: BC Managed Care – PPO | Admitting: Oncology

## 2012-09-27 ENCOUNTER — Other Ambulatory Visit (HOSPITAL_BASED_OUTPATIENT_CLINIC_OR_DEPARTMENT_OTHER): Payer: BC Managed Care – PPO | Admitting: Lab

## 2012-09-27 VITALS — BP 133/74 | HR 105 | Temp 98.2°F | Resp 18 | Ht 63.0 in | Wt 216.0 lb

## 2012-09-27 DIAGNOSIS — D6800 Von Willebrand disease, unspecified: Secondary | ICD-10-CM

## 2012-09-27 LAB — CBC WITH DIFFERENTIAL/PLATELET
Eosinophils Absolute: 0.1 10*3/uL (ref 0.0–0.5)
HCT: 40.8 % (ref 34.8–46.6)
LYMPH%: 35 % (ref 14.0–49.7)
MCV: 88.5 fL (ref 79.5–101.0)
MONO%: 4.2 % (ref 0.0–14.0)
NEUT#: 7.6 10*3/uL — ABNORMAL HIGH (ref 1.5–6.5)
NEUT%: 59.9 % (ref 38.4–76.8)
Platelets: 447 10*3/uL — ABNORMAL HIGH (ref 145–400)
RBC: 4.61 10*6/uL (ref 3.70–5.45)
nRBC: 0 % (ref 0–0)

## 2012-09-29 NOTE — Progress Notes (Signed)
Hematology and Oncology Follow Up Visit  Cassidy Tran [id number] 09-04-1975 37 y.o. 09/29/2012 6:40 PM   Principle Diagnosis: Encounter Diagnosis  Name Primary?  Scot Jun Willebrand disease Yes     Interim History:   A pleasant 37 year old woman I have only seen twice in the last 20 years. She has mild von Willebrand's disorder. Last time I saw her was in May of 2005. She has a seizure disorder since she was in the third grade and I always wonder whether or not some of her antiseizure medications, in particular Depakote, are responsible for an acquired von Willebrand's disorder? She was first diagnosed with von Willebrand's disorder following a surgical procedure on her sinuses when she was about 37 years old. She had subsequent wisdom teeth extractions and did not have any increased bleeding. She's had a number of minor surgeries using DDAVP perioperatively and has had no bleeding problems. When I saw her in 2005 she was going to have a elective tonsillectomy. Procedure was done without complication. Of note there is no family history of any bleeding disorder but I do not believe that we checked other family members. She has a sister 36 years older than her who is in remission from Hodgkin's lymphoma and likely cured. No other siblings.  She has developed progressive symptoms from endometriosis and an elective hysterectomy is planned. She had a dilatation and curettage and a laparoscopy about one year ago and used perioperative DDAVP with good outcome.  Medications: reviewed  Allergies:  Allergies  Allergen Reactions  . Aspirin Other (See Comments)    Childhood reaction  . Codeine Nausea And Vomiting  . Erythromycin Other (See Comments)    Childhood reaction  . Nsaids   . Penicillins Other (See Comments)    Childhood reaction     Review of Systems: Constitutional:   No constitutional symptoms Respiratory: No cough or dyspnea Cardiovascular:  No chest pain or  palpitations Gastrointestinal: No abdominal pain or change in bowel habit Genito-Urinary: See above Musculoskeletal: No musculoskeletal complaints Neurologic: She has not had a seizure now for a few years. Skin: No rash or ecchymoses Remaining ROS negative.  Physical Exam: Blood pressure 133/74, pulse 105, temperature 98.2 F (36.8 C), temperature source Oral, resp. rate 18, height 5\' 3"  (1.6 m), weight 216 lb (97.977 kg). Wt Readings from Last 3 Encounters:  09/27/12 216 lb (97.977 kg)  01/01/12 210 lb (95.255 kg)  04/26/11 242 lb (109.77 kg)     General appearance: Well-nourished Caucasian woman HENNT: Pharynx no erythema or exudate Lymph nodes: No adenopathy Breasts: Lungs: Clear to auscultation resonant to percussion Heart: Regular rhythm no murmur Abdomen: Soft, nontender, no mass, no organomegaly Extremities: No edema, no calf tenderness Musculoskeletal: No joint deformities GU: Vascular: No carotid bruits, no cyanosis Neurologic: Mental status intact, cranial nerves grossly normal, PERRLA, optic discs sharp vessels normal, no hemorrhage or exudate, motor strength 5 over 5, reflexes absent but symmetric Skin: No rash or ecchymosis  Lab Results: Lab Results  Component Value Date   WBC 12.6 Repeated and Verified* 09/27/2012   HGB 13.8 09/27/2012   HCT 40.8 09/27/2012   MCV 88.5 09/27/2012   PLT 447 verified* 09/27/2012     Chemistry      Component Value Date/Time   NA 139 01/01/2012 1519   K 4.1 01/01/2012 1519   CL 102 01/01/2012 1519   CO2 26 01/01/2012 1519   BUN 15 01/01/2012 1519   CREATININE 0.68 01/01/2012 1519   CREATININE 0.59 04/24/2011  1530      Component Value Date/Time   CALCIUM 9.7 01/01/2012 1519   ALKPHOS 128* 01/01/2012 1519   AST 15 01/01/2012 1519   ALT 23 01/01/2012 1519   BILITOT 0.5 01/01/2012 1519     PTT well within normal limits at 29.5 seconds less than a normal up to 37  Radiological Studies: No results found.  Impression: Mild, likely type  IA, von Willebrand's disorder  Recommendation: DDAVP 0.3 mcg per kilogram IV in 100 cc normal saline over 20 minutes one hour preop. Repeat same dose 24 hours after the first dose. Use nasal Stimate concentrated solution 1.5 mg per mL one spray each nostril for 3 additional days after discharge for a total of 5 days. Avoid excess hydration since the main side effect of this product is hyponatremia due to the antidiuretic effect. We have called in a prescription for the Stimate for her and she will have it filled before planned surgery on August 18. We do keep a supply of Humate-P available in the event of significant hemorrhage not controlled by DDAVP. I usually start with 80 units per kilogram loading dose and then 40 units per kilogram every 8-12 hours depending on the severity of the bleeding.  I am available for any problems that might arise. Cell phone [phone].   CC:. Dr. Colin Broach; Dr.Jane Boggs [facility]   Levert Feinstein, [place] 8/3/20146:40 PM

## 2012-10-01 ENCOUNTER — Telehealth: Payer: Self-pay | Admitting: Oncology

## 2012-10-01 ENCOUNTER — Encounter (HOSPITAL_COMMUNITY): Payer: Self-pay

## 2012-10-01 ENCOUNTER — Encounter (HOSPITAL_COMMUNITY)
Admission: RE | Admit: 2012-10-01 | Discharge: 2012-10-01 | Disposition: A | Discharge status: Home / Self Care | Payer: BC Managed Care – PPO | Source: Ambulatory Visit | Attending: Gynecology | Admitting: Gynecology

## 2012-10-01 ENCOUNTER — Telehealth: Payer: Self-pay

## 2012-10-01 DIAGNOSIS — Z01818 Encounter for other preprocedural examination: Secondary | ICD-10-CM | POA: Insufficient documentation

## 2012-10-01 DIAGNOSIS — Z01812 Encounter for preprocedural laboratory examination: Secondary | ICD-10-CM | POA: Insufficient documentation

## 2012-10-01 NOTE — Patient Instructions (Signed)
Your procedure is scheduled on:10/14/12  Enter through the Main Entrance at :1130 am Pick up desk phone and dial [phone] and inform us of your arrival.  Please call [phone] if you have any problems the morning of surgery.  Remember: Do not eat food after midnight:Sunday Clear liquids are ok until:9am on Monday   You may brush your teeth the morning of surgery.  Take these meds the morning of surgery with a sip of water:seiaures meds, bring inhaler to hospital  DO NOT wear jewelry, eye make-up, lipstick,body lotion, or dark fingernail polish.  (Polished toes are ok) You may wear deodorant.  If you are to be admitted after surgery, leave suitcase in car until your room has been assigned. Patients discharged on the day of surgery will not be allowed to drive home. Wear loose fitting, comfortable clothes for your ride home.

## 2012-10-01 NOTE — Telephone Encounter (Signed)
Janet notified

## 2012-10-01 NOTE — Telephone Encounter (Signed)
Ok not to do CBC at all, but do HCG only day of surgery

## 2012-10-01 NOTE — Pre-Procedure Instructions (Signed)
Cassidy Tran in doctor office notified that pt had CBC in Dr. Patsy Lager office on 09/27/12 and she reported back that Dr. Audie Box is ok with those results and does not need another preop CBC.

## 2012-10-01 NOTE — Telephone Encounter (Signed)
Patient presents to [facility] today for preadmission testing. Cassidy Lund, RN is not sure why she was scheduled so far in advance of her surgery on 10/14/12.  She did not draw any labs on her today because she just had labs done with Dr. Cyndie Chime on 8/1 and that was all normal with the exception of a slight increase in her platelets and WBC's.  Her question for you is are you okay with them doing the CBC that you ordered on the day of surgery or would you like for patient to come back in to them a few days before and have it drawn?

## 2012-10-03 ENCOUNTER — Encounter (HOSPITAL_COMMUNITY): Payer: Self-pay | Admitting: Pharmacist

## 2012-10-07 ENCOUNTER — Encounter: Payer: Self-pay | Admitting: Gynecology

## 2012-10-07 ENCOUNTER — Ambulatory Visit (INDEPENDENT_AMBULATORY_CARE_PROVIDER_SITE_OTHER): Payer: BC Managed Care – PPO | Admitting: Gynecology

## 2012-10-07 DIAGNOSIS — N949 Unspecified condition associated with female genital organs and menstrual cycle: Secondary | ICD-10-CM

## 2012-10-07 DIAGNOSIS — N809 Endometriosis, unspecified: Secondary | ICD-10-CM

## 2012-10-07 DIAGNOSIS — R102 Pelvic and perineal pain: Secondary | ICD-10-CM

## 2012-10-07 NOTE — Patient Instructions (Signed)
Followup for surgery as scheduled. 

## 2012-10-07 NOTE — Progress Notes (Signed)
Cassidy Tran 1975-11-19 [id number]   Preoperative consult  Chief complaint: Pelvic pain, endometriosis.  History of present illness: 37 y.o. G0P0 with long history of worsening pelvic pain. History of cramping on and off both right and left sided, sharp stabbing at times on a daily basis.  Had hysteroscopy D&C with removal of polypoidal endometrial tissue 2012 with continued cramping pain. Ultrasound showed small myomas 9 and 8 mm. Underwent laparoscopy 2013 with findings of endometriosis along the vesicouterine peritoneal fold. Subsequent had MRI and urologic evaluation with no evidence of gross bladder involvement. Was treated with a variety of medications to include Depo-Lupron suppression for 6 months with worsening symptoms despite. Also treated with Aygestin, oxycodone, ultram and amitriptyline. Had consult with Dr. Fredrich Birks at [facility] and he reviewed options with her to include repeat laparoscopy, trial of Mirena IUD, ultimate hysterectomy. Patient has decided to proceed with hysterectomy and is admitted for attempted TLH. She currently smokes and is being followed for asthma as well as von Willebrand's and has undergone a preoperative clearance by both of her allergist and hematologist. Retrial of more conservative treatment such as hormonal manipulation or Mirena IUD were discussed and she declines.   Past medical history,surgical history, medications, allergies, family history and social history were all reviewed and documented in the EPIC chart. ROS:  Was performed and pertinent positives and negatives are included in the history of present illness.  Exam:  Kim assistant General: well developed, well nourished female, no acute distress HEENT: normal  Lungs: clear to auscultation without wheezing, rales or rhonchi  Cardiac: regular rate without rubs, murmurs or gallops  Abdomen: soft, nontender without masses, guarding, rebound, organomegaly  Pelvic: external bus vagina: normal   Cervix:  grossly normal  Uterus: normal size, midline and mobile, nontender  Adnexa: without masses or tenderness      Assessment/Plan:  37 y.o. G0P0 Worsening pelvic pain, laparoscopic proven endometriosis, failed medical treatment with failure of Depo-Lupron, Aygestin and pain medications to include oxycodone, Ultram and amitriptyline. Given her age and smoking history birth control pills felt unwise. Serious consideration for attempted Mirena IUD encouraged but declined as well as more conservative repeat laparoscopy. Saw Dr Fredrich Birks who reviewed the same and she wants to proceed with hysterectomy. I had a very frank discussion particularly about continued pain following the procedure and the risks that she will continue to have pelvic pain despite hysterectomy. She understands this possibility and accepts this. The issues of oophorectomy at the same time for a "clean out" procedure now versus leaving her ovaries for continued hormonal production and the risk that she'll have continued or recurrent pelvic pain was reviewed. The risks of removing her ovaries to include symptoms of hypoestrogenism as well as the risk of difficulty replacing her with estrogen replacement therapy and the risks of accelerated cardiovascular disease and bone disease at a young age of the oophorectomy were reviewed. At this point the patient wants to keep both ovaries and accepts the risks of persistent or recurrent pain and reoperation in the future and the risk of ovarian cancer in the future. She does give me permission to remove one or both ovaries if at the time of surgery and significant disease is encountered and it is my best intraoperative decision to do so.   We also had a very frank discussion about the absolute and irreversible sterility of hysterectomy in a nulliparous woman. The patient restates now as in previous encounters that her and her husband have already had this discussion  and that they never planned on having  children given her seizure history and medical background and that childbearing is not an issue and she clearly understands the absolute irreversible sterility associated with hysterectomy. She understands there are still alternatives for more conservative approachs to try to save her fertility and she chooses not to do so.  I reviewed the expected intraoperative and postoperative courses as well as the recovery period. The risks of infection, prolonged antibiotics, reoperation for abscess or hematoma formation was discussed. She understands that we will attempt a laparoscopic approach but at any time during the procedure I may convert to a larger incision for TAH which would mean a longer recovery and more pain postoperatively. Incisional complications including opening and draining of incisions, closure by secondary intention, long-term issues of keloid/cosmetics and hernia formation were reviewed. The risks of inadvertent injury to internal organs including bowel, bladder, ureters, vessels and nerves either immediately recognized or delayed recognized, necessitating major exploratory reparative surgeries including bowel resection, ostomy formation, bladder repair and ureteral damage repair was all discussed understood and accepted. Realistic risks of bladder injury given the endometriosis involving her vesicouterine fold was reviewed. The risk of hemorrhage necessitating transfusion, again recognizing her von Willebrand's disease and the risks of transfusion including transfusion reaction, hepatitis, HIV, mad cow disease, unknown entities was all discussed understood and accepted. She will receive DDAVP perioperatively as well as Stimate postoperatively as recommended by her hematologist Dr. Cyndie Chime in his 09/27/2012 note. The patient's questions were all answered to her satisfaction she is ready to proceed with surgery.    Note: This document was prepared with digital dictation and possible smart phrase  technology. Any transcriptional errors that result from this process are unintentional.  Dara Lords MD, 4:53 PM 10/07/2012

## 2012-10-07 NOTE — H&P (Signed)
Cassidy Tran Dec 07, 1975 [id number]   History and Physical  Chief complaint: Pelvic pain, endometriosis.  History of present illness: 37 y.o. G0P0 with long history of worsening pelvic pain. History of cramping on and off both right and left sided, sharp stabbing at times on a daily basis.  Had hysteroscopy D&C with removal of polypoidal endometrial tissue 2012 with continued cramping pain. Ultrasound showed small myomas 9 and 8 mm. Underwent laparoscopy 2013 with findings of endometriosis along the vesicouterine peritoneal fold. Subsequent had MRI and urologic evaluation with no evidence of gross bladder involvement. Was treated with a variety of medications to include Depo-Lupron suppression for 6 months with worsening symptoms despite. Also treated with Aygestin, oxycodone, ultram and amitriptyline. Had consult with Dr. Fredrich Birks at [place] and he reviewed options with her to include repeat laparoscopy, trial of Mirena IUD, ultimate hysterectomy. Patient has decided to proceed with hysterectomy and is admitted for attempted TLH. She currently smokes and is being followed for asthma as well as von Willebrand's and has undergone a preoperative clearance by both of her allergist and hematologist. Retrial of more conservative treatment such as hormonal manipulation or Mirena IUD were discussed and she declines.   Past medical history,surgical history, medications, allergies, family history and social history were all reviewed and documented in the EPIC chart. ROS:  Was performed and pertinent positives and negatives are included in the history of present illness.  Exam:  Kim assistant General: well developed, well nourished female, no acute distress HEENT: normal  Lungs: clear to auscultation without wheezing, rales or rhonchi  Cardiac: regular rate without rubs, murmurs or gallops  Abdomen: soft, nontender without masses, guarding, rebound, organomegaly  Pelvic: external bus vagina: normal    Cervix: grossly normal  Uterus: normal size, midline and mobile, nontender  Adnexa: without masses or tenderness      Assessment/Plan:  37 y.o. G0P0 Worsening pelvic pain, laparoscopic proven endometriosis, failed medical treatment with failure of Depo-Lupron, Aygestin and pain medications to include oxycodone, Ultram and amitriptyline. Given her age and smoking history birth control pills felt unwise. Serious consideration for attempted Mirena IUD encouraged but declined as well as more conservative repeat laparoscopy. Saw Dr Fredrich Birks who reviewed the same and she wants to proceed with hysterectomy. I had a very frank discussion particularly about continued pain following the procedure and the risks that she will continue to have pelvic pain despite hysterectomy. She understands this possibility and accepts this. The issues of oophorectomy at the same time for a "clean out" procedure now versus leaving her ovaries for continued hormonal production and the risk that she'll have continued or recurrent pelvic pain was reviewed. The risks of removing her ovaries to include symptoms of hypoestrogenism as well as the risk of difficulty replacing her with estrogen replacement therapy and the risks of accelerated cardiovascular disease and bone disease at a young age of the oophorectomy were reviewed. At this point the patient wants to keep both ovaries and accepts the risks of persistent or recurrent pain and reoperation in the future and the risk of ovarian cancer in the future. She does give me permission to remove one or both ovaries if at the time of surgery and significant disease is encountered and it is my best intraoperative decision to do so.   We also had a very frank discussion about the absolute and irreversible sterility of hysterectomy in a nulliparous woman. The patient restates now as in previous encounters that her and her husband have already had this  discussion and that they never planned on  having children given her seizure history and medical background and that childbearing is not an issue and she clearly understands the absolute irreversible sterility associated with hysterectomy. She understands there are still alternatives for more conservative approachs to try to save her fertility and she chooses not to do so.  I reviewed the expected intraoperative and postoperative courses as well as the recovery period. The risks of infection, prolonged antibiotics, reoperation for abscess or hematoma formation was discussed. She understands that we will attempt a laparoscopic approach but at any time during the procedure I may convert to a larger incision for TAH which would mean a longer recovery and more pain postoperatively. Incisional complications including opening and draining of incisions, closure by secondary intention, long-term issues of keloid/cosmetics and hernia formation were reviewed. The risks of inadvertent injury to internal organs including bowel, bladder, ureters, vessels and nerves either immediately recognized or delayed recognized, necessitating major exploratory reparative surgeries including bowel resection, ostomy formation, bladder repair and ureteral damage repair was all discussed understood and accepted. Realistic risks of bladder injury given the endometriosis involving her vesicouterine fold was reviewed. The risk of hemorrhage necessitating transfusion, again recognizing her von Willebrand's disease and the risks of transfusion including transfusion reaction, hepatitis, HIV, mad cow disease, unknown entities was all discussed understood and accepted. She will receive DDAVP perioperatively as well as Stimate postoperatively as recommended by her hematologist Dr. Cyndie Chime in his 09/27/2012 note. The patient's questions were all answered to her satisfaction she is ready to proceed with surgery.    Note: This document was prepared with digital dictation and possible smart  phrase technology. Any transcriptional errors that result from this process are unintentional.  Dara Lords MD, 5:18 PM 10/07/2012

## 2012-10-13 MED ORDER — GENTAMICIN SULFATE 40 MG/ML IJ SOLN
INTRAVENOUS | Status: AC
  Administered 2012-10-14: 100 mL via INTRAVENOUS
  Filled 2012-10-13: qty 8.25

## 2012-10-14 ENCOUNTER — Ambulatory Visit (HOSPITAL_COMMUNITY): Payer: BC Managed Care – PPO | Admitting: Anesthesiology

## 2012-10-14 ENCOUNTER — Encounter (HOSPITAL_COMMUNITY)
Admission: RE | Disposition: A | Discharge status: Home / Self Care | Payer: Self-pay | Source: Ambulatory Visit | Attending: Gynecology

## 2012-10-14 ENCOUNTER — Encounter (HOSPITAL_COMMUNITY): Payer: Self-pay | Admitting: Anesthesiology

## 2012-10-14 ENCOUNTER — Ambulatory Visit (HOSPITAL_COMMUNITY)
Admission: RE | Admit: 2012-10-14 | Discharge: 2012-10-15 | Disposition: A | Discharge status: Home / Self Care | Payer: BC Managed Care – PPO | Source: Ambulatory Visit | Attending: Gynecology | Admitting: Gynecology

## 2012-10-14 DIAGNOSIS — N803 Endometriosis of pelvic peritoneum, unspecified: Secondary | ICD-10-CM | POA: Insufficient documentation

## 2012-10-14 DIAGNOSIS — N808 Other endometriosis: Secondary | ICD-10-CM | POA: Insufficient documentation

## 2012-10-14 DIAGNOSIS — N8000 Endometriosis of the uterus, unspecified: Secondary | ICD-10-CM | POA: Insufficient documentation

## 2012-10-14 DIAGNOSIS — D6800 Von Willebrand disease, unspecified: Secondary | ICD-10-CM | POA: Insufficient documentation

## 2012-10-14 DIAGNOSIS — F17200 Nicotine dependence, unspecified, uncomplicated: Secondary | ICD-10-CM | POA: Insufficient documentation

## 2012-10-14 DIAGNOSIS — N949 Unspecified condition associated with female genital organs and menstrual cycle: Secondary | ICD-10-CM | POA: Insufficient documentation

## 2012-10-14 HISTORY — PX: CYSTOSCOPY: SHX5120

## 2012-10-14 HISTORY — PX: LAPAROSCOPIC HYSTERECTOMY: SHX1926

## 2012-10-14 LAB — CBC
HCT: 39.4 % (ref 36.0–46.0)
MCH: 29.8 pg (ref 26.0–34.0)
MCHC: 33.8 g/dL (ref 30.0–36.0)
RDW: 14.4 % (ref 11.5–15.5)

## 2012-10-14 LAB — HCG, SERUM, QUALITATIVE: Preg, Serum: NEGATIVE

## 2012-10-14 LAB — TYPE AND SCREEN: ABO/RH(D): O POS

## 2012-10-14 LAB — ABO/RH: ABO/RH(D): O POS

## 2012-10-14 SURGERY — HYSTERECTOMY, TOTAL, LAPAROSCOPIC
Anesthesia: General | Site: Vagina | Wound class: Clean Contaminated

## 2012-10-14 MED ORDER — HYDROMORPHONE HCL PF 1 MG/ML IJ SOLN: 0.2500 mg | INTRAMUSCULAR | Status: DC | PRN

## 2012-10-14 MED ORDER — BUPIVACAINE HCL (PF) 0.25 % IJ SOLN
INTRAMUSCULAR | Status: AC
  Filled 2012-10-14: qty 30

## 2012-10-14 MED ORDER — DEXAMETHASONE SODIUM PHOSPHATE 10 MG/ML IJ SOLN
INTRAMUSCULAR | Status: AC
  Filled 2012-10-14: qty 1

## 2012-10-14 MED ORDER — NEOSTIGMINE METHYLSULFATE 1 MG/ML IJ SOLN
INTRAMUSCULAR | Status: AC
  Filled 2012-10-14: qty 1

## 2012-10-14 MED ORDER — INDIGOTINDISULFONATE SODIUM 8 MG/ML IJ SOLN
INTRAMUSCULAR | Status: AC
  Filled 2012-10-14: qty 5

## 2012-10-14 MED ORDER — MEPERIDINE HCL 25 MG/ML IJ SOLN: 6.2500 mg | INTRAMUSCULAR | Status: DC | PRN

## 2012-10-14 MED ORDER — HYDROMORPHONE HCL PF 1 MG/ML IJ SOLN
INTRAMUSCULAR | Status: DC | PRN
  Administered 2012-10-14: 1 mg via INTRAVENOUS

## 2012-10-14 MED ORDER — ZONISAMIDE 100 MG PO CAPS
100.0000 mg | ORAL_CAPSULE | Freq: Every day | ORAL | Status: DC
  Administered 2012-10-14: 100 mg via ORAL
  Filled 2012-10-14 (×2): qty 1

## 2012-10-14 MED ORDER — ONDANSETRON HCL 4 MG/2ML IJ SOLN
INTRAMUSCULAR | Status: DC | PRN
  Administered 2012-10-14: 4 mg via INTRAVENOUS

## 2012-10-14 MED ORDER — SODIUM CHLORIDE 0.9 % IJ SOLN: 9.0000 mL | INTRAMUSCULAR | Status: DC | PRN

## 2012-10-14 MED ORDER — LEVALBUTEROL TARTRATE 45 MCG/ACT IN AERO: 1.0000 | INHALATION_SPRAY | RESPIRATORY_TRACT | Status: DC | PRN

## 2012-10-14 MED ORDER — DEXTROSE-NACL 5-0.9 % IV SOLN
INTRAVENOUS | Status: DC
  Administered 2012-10-14 – 2012-10-15 (×2): via INTRAVENOUS

## 2012-10-14 MED ORDER — MIDAZOLAM HCL 2 MG/2ML IJ SOLN
INTRAMUSCULAR | Status: AC
  Filled 2012-10-14: qty 2

## 2012-10-14 MED ORDER — LIDOCAINE HCL (CARDIAC) 20 MG/ML IV SOLN
INTRAVENOUS | Status: DC | PRN
  Administered 2012-10-14 (×2): 50 mg via INTRAVENOUS

## 2012-10-14 MED ORDER — DIPHENHYDRAMINE HCL 50 MG/ML IJ SOLN: 12.5000 mg | Freq: Four times a day (QID) | INTRAMUSCULAR | Status: DC | PRN

## 2012-10-14 MED ORDER — FENTANYL CITRATE 0.05 MG/ML IJ SOLN
INTRAMUSCULAR | Status: DC | PRN
  Administered 2012-10-14: 50 ug via INTRAVENOUS
  Administered 2012-10-14 (×2): 100 ug via INTRAVENOUS

## 2012-10-14 MED ORDER — DIPHENHYDRAMINE HCL 12.5 MG/5ML PO ELIX: 12.5000 mg | ORAL_SOLUTION | Freq: Four times a day (QID) | ORAL | Status: DC | PRN

## 2012-10-14 MED ORDER — GLYCOPYRROLATE 0.2 MG/ML IJ SOLN
INTRAMUSCULAR | Status: DC | PRN
  Administered 2012-10-14: .6 mg via INTRAVENOUS

## 2012-10-14 MED ORDER — ONDANSETRON HCL 4 MG/2ML IJ SOLN: 4.0000 mg | Freq: Four times a day (QID) | INTRAMUSCULAR | Status: DC | PRN

## 2012-10-14 MED ORDER — LIDOCAINE HCL (CARDIAC) 20 MG/ML IV SOLN
INTRAVENOUS | Status: AC
  Filled 2012-10-14: qty 5

## 2012-10-14 MED ORDER — NEOSTIGMINE METHYLSULFATE 1 MG/ML IJ SOLN
INTRAMUSCULAR | Status: DC | PRN
  Administered 2012-10-14: 3.5 mg via INTRAVENOUS

## 2012-10-14 MED ORDER — PROMETHAZINE HCL 25 MG/ML IJ SOLN: 6.2500 mg | INTRAMUSCULAR | Status: DC | PRN

## 2012-10-14 MED ORDER — ONDANSETRON HCL 4 MG/2ML IJ SOLN
INTRAMUSCULAR | Status: AC
  Filled 2012-10-14: qty 2

## 2012-10-14 MED ORDER — MIDAZOLAM HCL 5 MG/5ML IJ SOLN
INTRAMUSCULAR | Status: DC | PRN
  Administered 2012-10-14: 2 mg via INTRAVENOUS

## 2012-10-14 MED ORDER — SODIUM CHLORIDE 0.9 % IV SOLN
0.3000 ug/kg | Freq: Once | INTRAVENOUS | Status: DC
  Administered 2012-10-15: 29.6 ug via INTRAVENOUS
  Filled 2012-10-14: qty 7.4

## 2012-10-14 MED ORDER — PROPOFOL 10 MG/ML IV EMUL
INTRAVENOUS | Status: AC
  Filled 2012-10-14: qty 20

## 2012-10-14 MED ORDER — DIVALPROEX SODIUM 500 MG PO DR TAB
1000.0000 mg | DELAYED_RELEASE_TABLET | Freq: Two times a day (BID) | ORAL | Status: DC
  Administered 2012-10-14 – 2012-10-15 (×2): 1000 mg via ORAL
  Filled 2012-10-14 (×4): qty 2

## 2012-10-14 MED ORDER — STERILE WATER FOR IRRIGATION IR SOLN
Status: DC | PRN
  Administered 2012-10-14: 1000 mL

## 2012-10-14 MED ORDER — PROPOFOL 10 MG/ML IV BOLUS
INTRAVENOUS | Status: DC | PRN
  Administered 2012-10-14: 200 mg via INTRAVENOUS

## 2012-10-14 MED ORDER — NALOXONE HCL 0.4 MG/ML IJ SOLN: 0.4000 mg | INTRAMUSCULAR | Status: DC | PRN

## 2012-10-14 MED ORDER — LACTATED RINGERS IV SOLN
INTRAVENOUS | Status: DC
  Administered 2012-10-14 (×3): via INTRAVENOUS

## 2012-10-14 MED ORDER — BUPIVACAINE HCL (PF) 0.25 % IJ SOLN
INTRAMUSCULAR | Status: DC | PRN
  Administered 2012-10-14: 5 mL

## 2012-10-14 MED ORDER — LACTATED RINGERS IR SOLN
Status: DC | PRN
  Administered 2012-10-14: 3000 mL

## 2012-10-14 MED ORDER — INDIGOTINDISULFONATE SODIUM 8 MG/ML IJ SOLN
INTRAMUSCULAR | Status: DC | PRN
  Administered 2012-10-14: 5 mL via INTRAVENOUS

## 2012-10-14 MED ORDER — GLYCOPYRROLATE 0.2 MG/ML IJ SOLN
INTRAMUSCULAR | Status: AC
  Filled 2012-10-14: qty 1

## 2012-10-14 MED ORDER — SODIUM CHLORIDE 0.9 % IV SOLN
0.3000 ug/kg | Freq: Once | INTRAVENOUS | Status: AC
  Administered 2012-10-14 (×2): 29.6 ug via INTRAVENOUS
  Filled 2012-10-14: qty 7.4

## 2012-10-14 MED ORDER — DESMOPRESSIN ACETATE 4 MCG/ML IJ SOLN: 0.3000 ug/kg | Freq: Once | INTRAMUSCULAR | Status: DC

## 2012-10-14 MED ORDER — MORPHINE SULFATE (PF) 1 MG/ML IV SOLN
INTRAVENOUS | Status: DC
Start: 2012-10-14 — End: 2012-10-15
  Administered 2012-10-14: 3 mg via INTRAVENOUS
  Administered 2012-10-14: 1.5 mg via INTRAVENOUS
  Administered 2012-10-15 (×2): 4.5 mg via INTRAVENOUS
  Administered 2012-10-15: 1.5 mg via INTRAVENOUS
  Filled 2012-10-14: qty 25

## 2012-10-14 MED ORDER — DESMOPRESSIN ACETATE 4 MCG/ML IJ SOLN: 29.6000 ug | Freq: Once | INTRAMUSCULAR | Status: DC

## 2012-10-14 MED ORDER — PNEUMOCOCCAL VAC POLYVALENT 25 MCG/0.5ML IJ INJ
0.5000 mL | INJECTION | INTRAMUSCULAR | Status: DC
  Filled 2012-10-14: qty 0.5

## 2012-10-14 MED ORDER — DEXAMETHASONE SODIUM PHOSPHATE 4 MG/ML IJ SOLN
INTRAMUSCULAR | Status: DC | PRN
  Administered 2012-10-14: 10 mg via INTRAVENOUS

## 2012-10-14 MED ORDER — ROCURONIUM BROMIDE 100 MG/10ML IV SOLN
INTRAVENOUS | Status: DC | PRN
  Administered 2012-10-14: 5 mg via INTRAVENOUS
  Administered 2012-10-14: 35 mg via INTRAVENOUS
  Administered 2012-10-14: 10 mg via INTRAVENOUS
  Administered 2012-10-14: 20 mg via INTRAVENOUS

## 2012-10-14 MED ORDER — FENTANYL CITRATE 0.05 MG/ML IJ SOLN
INTRAMUSCULAR | Status: AC
  Filled 2012-10-14: qty 5

## 2012-10-14 SURGICAL SUPPLY — 61 items
ADH SKN CLS APL DERMABOND .7 (GAUZE/BANDAGES/DRESSINGS) ×2
BLADE SURG 15 STRL LF C SS BP (BLADE) ×2 IMPLANT
BLADE SURG 15 STRL SS (BLADE) ×3
CABLE HIGH FREQUENCY MONO STRZ (ELECTRODE) IMPLANT
CLOTH BEACON ORANGE TIMEOUT ST (SAFETY) ×3 IMPLANT
CONT PATH 16OZ SNAP LID 3702 (MISCELLANEOUS) IMPLANT
COVER MAYO STAND STRL (DRAPES) ×3 IMPLANT
COVER TABLE BACK 60X90 (DRAPES) ×3 IMPLANT
DERMABOND ADVANCED (GAUZE/BANDAGES/DRESSINGS) ×1
DERMABOND ADVANCED .7 DNX12 (GAUZE/BANDAGES/DRESSINGS) IMPLANT
DEVICE SUTURE ENDOST 10MM (ENDOMECHANICALS) IMPLANT
DISSECTOR BLUNT TIP ENDO 5MM (MISCELLANEOUS) IMPLANT
DRESSING TELFA 8X3 (GAUZE/BANDAGES/DRESSINGS) ×1 IMPLANT
DRSG TEGADERM 6X8 (GAUZE/BANDAGES/DRESSINGS) ×1 IMPLANT
ENDOSTITCH 0 SINGLE 48 (SUTURE) IMPLANT
EVACUATOR SMOKE 8.L (FILTER) ×6 IMPLANT
GLOVE BIO SURGEON STRL SZ7.5 (GLOVE) ×6 IMPLANT
GLOVE BIOGEL PI IND STRL 6.5 (GLOVE) IMPLANT
GLOVE BIOGEL PI IND STRL 8 (GLOVE) ×2 IMPLANT
GLOVE BIOGEL PI INDICATOR 6.5 (GLOVE)
GLOVE BIOGEL PI INDICATOR 8 (GLOVE) ×1
GLOVE ECLIPSE 7.5 STRL STRAW (GLOVE) ×6 IMPLANT
GOWN PREVENTION PLUS LG XLONG (DISPOSABLE) ×6 IMPLANT
HARMONIC RUM II 2.5CM SILVER (DISPOSABLE) ×3
HARMONIC RUM II 3.0CM SILVER (DISPOSABLE) ×3
HARMONIC RUM II 3.5CM SILVER (DISPOSABLE)
HARMONIC RUM II 4.0CM SILVER (DISPOSABLE)
HEMOSTAT SURGICEL 4X8 (HEMOSTASIS) ×1 IMPLANT
NEEDLE MAYO .5 CIRCLE (NEEDLE) IMPLANT
NS IRRIG 1000ML POUR BTL (IV SOLUTION) ×3 IMPLANT
PACK LAVH (CUSTOM PROCEDURE TRAY) ×3 IMPLANT
SCALPEL HARMONIC ACE (MISCELLANEOUS) ×3 IMPLANT
SCALPEL HRMNC RUM II 2.5 SILVR (DISPOSABLE) IMPLANT
SCALPEL HRMNC RUM II 3.0 SILVR (DISPOSABLE) IMPLANT
SCALPEL HRMNC RUM II 3.5 SILVR (DISPOSABLE) IMPLANT
SCALPEL HRMNC RUM II 4.0 SILVR (DISPOSABLE) IMPLANT
SCISSORS LAP 5X35 DISP (ENDOMECHANICALS) IMPLANT
SET CYSTO W/LG BORE CLAMP LF (SET/KITS/TRAYS/PACK) ×1 IMPLANT
SET IRRIG TUBING LAPAROSCOPIC (IRRIGATION / IRRIGATOR) ×3 IMPLANT
SUT PLAIN 4 0 FS 2 27 (SUTURE) ×6 IMPLANT
SUT VIC AB 0 CT1 18XCR BRD8 (SUTURE) IMPLANT
SUT VIC AB 0 CT1 27 (SUTURE)
SUT VIC AB 0 CT1 27XBRD ANBCTR (SUTURE) IMPLANT
SUT VIC AB 0 CT1 8-18 (SUTURE) ×3
SUT VIC AB 2-0 SH 27 (SUTURE)
SUT VIC AB 2-0 SH 27XBRD (SUTURE) IMPLANT
SUT VICRYL 0 TIES 12 18 (SUTURE) IMPLANT
SUT VICRYL 0 UR6 27IN ABS (SUTURE) ×3 IMPLANT
SYR 50ML LL SCALE MARK (SYRINGE) ×3 IMPLANT
SYR BULB IRRIGATION 50ML (SYRINGE) ×3 IMPLANT
TIP UTERINE 5.1X6CM LAV DISP (MISCELLANEOUS) ×1 IMPLANT
TIP UTERINE 6.7X10CM GRN DISP (MISCELLANEOUS) IMPLANT
TIP UTERINE 6.7X6CM WHT DISP (MISCELLANEOUS) IMPLANT
TIP UTERINE 6.7X8CM BLUE DISP (MISCELLANEOUS) IMPLANT
TOWEL OR 17X24 6PK STRL BLUE (TOWEL DISPOSABLE) ×6 IMPLANT
TRAY FOLEY CATH 14FR (SET/KITS/TRAYS/PACK) ×3 IMPLANT
TROCAR BALLN 12MMX100 BLUNT (TROCAR) IMPLANT
TROCAR XCEL NON-BLD 11X100MML (ENDOMECHANICALS) ×6 IMPLANT
TROCAR XCEL NON-BLD 5MMX100MML (ENDOMECHANICALS) ×6 IMPLANT
WARMER LAPAROSCOPE (MISCELLANEOUS) ×3 IMPLANT
WATER STERILE IRR 1000ML POUR (IV SOLUTION) ×3 IMPLANT

## 2012-10-14 NOTE — H&P (Signed)
  The patient was examined.  I reviewed the proposed surgery and consent form with the patient.  The dictated history and physical is current and accurate and all questions were answered. The patient is ready to proceed with surgery and has a realistic understanding and expectation for the outcome.   Dara Lords MD, 12:37 PM 10/14/2012

## 2012-10-14 NOTE — Anesthesia Preprocedure Evaluation (Signed)
Anesthesia Evaluation  Patient identified by MRN, date of birth, ID band Patient awake    Reviewed: Allergy & Precautions, H&P , NPO status , Patient's Chart, lab work & pertinent test results  History of Anesthesia Complications Negative for: history of anesthetic complications  Airway Mallampati: II TM Distance: >3 FB Neck ROM: full   Comment: + TMJ - audible click with mouth opening, no pain Dental no notable dental hx. (+) Teeth Intact   Pulmonary asthma (no hosp, no intub - uses rescue inhaler daily, several years since steroids) , Current Smoker (< 1/2 ppd),  Pt will use her inhaler prior to OR.   Pulmonary exam normal - wheezing (will get inhaler from husband and use now prior to OR)      Cardiovascular Exercise Tolerance: Good     Neuro/Psych  Headaches (migraines - last 2 days ago, related to sinuses), Seizures - (last seizure 2 years ago), Well Controlled,     GI/Hepatic negative GI ROS, Neg liver ROS,   Endo/Other  Morbid obesity  Renal/GU negative Renal ROS  Female GU complaint (pelvic pain - on percocet (takes 1/2 percocet at a time))     Musculoskeletal   Abdominal (+) + obese,   Peds  Hematology  (+) Blood dyscrasia (von willebrands - will use stimate nasal spray after surgery), ,   Anesthesia Other Findings   Reproductive/Obstetrics negative OB ROS                           Anesthesia Physical  Anesthesia Plan  ASA: III  Anesthesia Plan: General ETT   Post-op Pain Management:    Induction: Intravenous  Airway Management Planned: Oral ETT  Additional Equipment:   Intra-op Plan:   Post-operative Plan: Extubation in OR  Informed Consent: I have reviewed the patients History and Physical, chart, labs and discussed the procedure including the risks, benefits and alternatives for the proposed anesthesia with the patient or authorized representative who has indicated  his/her understanding and acceptance.   Dental Advisory Given  Plan Discussed with: CRNA and Surgeon  Anesthesia Plan Comments:         Anesthesia Quick Evaluation

## 2012-10-14 NOTE — Progress Notes (Signed)
Patient ID: Cassidy Tran, female   DOB: [birth year], 37 y.o.   MRN: [id number] Awake, alert with minimal pain Abd soft, incisions dry intact Urine output 150cc at last foley empty.  Results of surgery reviewed with patient and family.  Plan routine PO care and repeat DDAVP tomorrow as recommended by hematology.

## 2012-10-14 NOTE — Op Note (Signed)
Cassidy Tran 1975-04-24 [id number]   Post Operative Note   Date of surgery:  10/14/2012  Pre Op Dx:  Pelvic pain, endometriosis  Post Op Dx:  Pelvic pain, endometriosis  Procedure:  Total laparoscopic hysterectomy, cystoscopy  Surgeon:  Dara Lords  Assistant:  Reynaldo Minium  Anesthesia:  General  EBL:  Less than 75 cc  Complications:  None  Specimen:  #1 peritoneal implant dome of the bladder endometriosis #2 uterus with attached cervix to pathology  Findings: EUA:  External BUS vagina normal. Cervix normal. Uterus normal size midline mobile. Adnexa without masses   Operative:  Anterior cul-de-sac with classic vesicouterine peritoneal endometriosis, shaggy in appearance. Posterior cul-de-sac normal. Uterus normal size shape and contour. Right and left fallopian tubes normal length caliber and fimbriated ends. Right and left ovaries grossly normal free and mobile. No other evidence of active peritoneal endometriosis or lesions. Upper abdominal exam appendix retrocecal difficult to visualize. Liver and gallbladder not visualized due to omentum obscuring view.  Procedure:  The patient was taken to the operating room having received her preoperative antibiotics and preoperative DDAVP. She underwent general anesthesia and was placed in the low dorsal lithotomy position. She received an abdominal/perineal/vaginal preparation with Betadine solution and an EUA was performed. The time out was performed by the surgical team and the cervix was visualized with a speculum, anterior lip grasped with a single-tooth tenaculum and the cervix was measured, gently dilated and the uterus sounded for choice of the appropriate [employer] features editor.  These were placed without difficulty, a Foley catheter was subsequently placed in sterile technique and the patient was draped in the usual fashion. A vertical infraumbilical incision was made and using the 10 mm direct entry trocar the  abdomen was directly entered under direct visualization without difficulty. The abdomen was then insufflated and right and left 5 mm suprapubic midabdominal ports were placed under direct visualization after transillumination for the vessels without difficulty. Examination of the pelvic organs and upper abdominal exam was carried out with findings noted above. The ureters were identified bilaterally and found to be away from the expected operative field. The left uterine ovarian pedicle as well as the fallopian tube were transected using the harmonic scalpel and the left broad ligament and round ligament were likewise transected using the harmonic scalpel without difficulty.  A similar procedure was carried out on the right. The anterior vesicouterine peritoneal fold was then transected using the harmonic scalpel and the bladder flap was sharply and bluntly developed without difficulty against the elevated Koh cup. After the bladder flap development was felt to be appropriate the left uterine artery and vein were skeletonized against the elevated Koh cup and bipolar cauterized to a flow of 0 at the level of the lower uterine segment cervical junction. This area was then transected using the harmonic scalpel. A similar procedure carried out on the other side. Appropriate blanching of the uterus occurred and subsequently the vaginal balloon was insufflated and the vagina was entered anteriorly against the elevated Koh cup using the harmonic scalpel. The uterus was then circumferentially excised from the patient using the harmonic scalpel against the Koh cup and the specimen was ultimately removed through the vagina. Attention was then turned to closing the vaginal cuff and the patient was placed in the high dorsal lithotomy position and the cuff identified and grasped with sequential Allis clamps and closed anterior to posterior with interrupted figure-of-eight 0 Vicryl sutures. The vagina was then copiously irrigated  showing adequate hemostasis and the Foley catheter was removed, the patient received intravenous Indigo Carmine and cystoscopy was performed showing a distended intact bladder with bilateral ureteral jets. The Foley catheter was replaced and the patient was placed in the low dorsal lithotomy position, the surgical team regloved and the abdomen was reinsufflated, copiously irrigated and all pedicles were inspected as well as the vaginal cuff showing adequate hemostasis under low-pressure situation. Prophylactic Surgicel was placed along the vaginal cuff given her history of von Willebrand's and the right and left 5 mm ports were removed under direct visualization showing adequate hemostasis, the gas slowly allowed to escape and the infraumbilical port back out under direct visualization showing adequate hemostasis and no evidence of hernia formation. The skin incisions were all injected using 0.25% Marcaine and the infraumbilical port closed using 0 Vicryl with an interrupted subcutaneous fashion stitch. The infraumbilical skin and the right 5 mm port skin were reapproximated using Dermabond skin adhesive and 4-0 plain suture interrupted cuticular stitch was used in the left 5 mm port due to oozing. Dermabond was also placed over this area. The patient was placed in the supine position, awakened without difficulty and taken to the recovery room in good condition having tolerated the procedure well.   Note: This document was prepared with digital dictation and possible smart phrase technology. Any transcriptional errors that result from this process are unintentional.  Dara Lords MD, 4:59 PM 10/14/2012

## 2012-10-14 NOTE — Transfer of Care (Signed)
Immediate Anesthesia Transfer of Care Note  Patient: Cassidy Tran  Procedure(s) Performed: Procedure(s) with comments: HYSTERECTOMY TOTAL LAPAROSCOPIC (N/A) - CPT [phone]  2 1/2 hours  Dr. Reynaldo Minium to assist. CYSTOSCOPY (N/A)  Patient Location: PACU  Anesthesia Type:General  Level of Consciousness: awake, alert  and oriented  Airway & Oxygen Therapy: Patient Spontanous Breathing and Patient connected to face mask oxygen  Post-op Assessment: Report given to PACU RN and Post -op Vital signs reviewed and stable  Post vital signs: Reviewed and stable  Complications: No apparent anesthesia complications

## 2012-10-14 NOTE — Anesthesia Postprocedure Evaluation (Signed)
Anesthesia Post Note  Patient: Cassidy Tran  Procedure(s) Performed: Procedure(s) (LRB): HYSTERECTOMY TOTAL LAPAROSCOPIC (N/A) CYSTOSCOPY (N/A)  Anesthesia type: GA  Patient location: PACU  Post pain: Pain level controlled  Post assessment: Post-op Vital signs reviewed  Last Vitals:  Filed Vitals:   10/14/12 1545  BP: 140/78  Pulse: 101  Temp:   Resp: 20    Post vital signs: Reviewed  Level of consciousness: sedated  Complications: No apparent anesthesia complications

## 2012-10-15 ENCOUNTER — Encounter (HOSPITAL_COMMUNITY): Payer: Self-pay | Admitting: Gynecology

## 2012-10-15 MED ORDER — PROMETHAZINE HCL 25 MG PO TABS: 25.0000 mg | ORAL_TABLET | Freq: Four times a day (QID) | ORAL | Status: DC | PRN

## 2012-10-15 MED ORDER — OXYCODONE-ACETAMINOPHEN 5-325 MG PO TABS
1.0000 | ORAL_TABLET | ORAL | Status: DC | PRN
  Administered 2012-10-15 (×2): 1 via ORAL
  Filled 2012-10-15 (×2): qty 1

## 2012-10-15 MED ORDER — OXYCODONE-ACETAMINOPHEN 5-325 MG PO TABS: 1.0000 | ORAL_TABLET | ORAL | Status: DC | PRN

## 2012-10-15 NOTE — Progress Notes (Signed)
Cassidy Tran 31-Oct-1975 [id number]   1 Day Post-Op Procedure(s) (LRB): HYSTERECTOMY TOTAL LAPAROSCOPIC (N/A) CYSTOSCOPY (N/A)  Subjective: Patient reports feels well, no acute distress, pain severity reported mild, yes taking PO, foley catheter out, no voiding, yes ambulating, no passing flatus  Objective: Vital signs in last 24 hours: Temp:  [97.4 F (36.3 C)-98.4 F (36.9 C)] 98.4 F (36.9 C) (08/19 0935) Pulse Rate:  [100-117] 113 (08/19 1250) Resp:  [12-20] 18 (08/19 1250) BP: (102-151)/(65-87) 117/65 mmHg (08/19 1250) SpO2:  [94 %-99 %] 97 % (08/19 1250) Last BM Date: 10/13/12    EXAM General: awake, alert and no distress Resp: rhonchi clear to auscultation bilaterally Cardio: regular rate and rhythm, S1, S2 normal, no murmur, click, rub or gallop GI: soft, minimal tender, bowel sounds active, incisions dry, intact Lower Extremities: Without swelling or tenderness Vaginal Bleeding: Reported scant   Lab Results:   Recent Labs  10/14/12 1046  WBC 13.6*  HGB 13.3  HCT 39.4  PLT 330    Assessment: s/p Procedure(s): HYSTERECTOMY TOTAL LAPAROSCOPIC CYSTOSCOPY: progressing well, ready for discharge.  Will await next dose of DDAVP due approximately 1300 hours and then discharged home afterwards. We'll also await voiding as she just had her Foley catheter removed.  This progress note was dictated late and is based upon 8 AM rounding on the patient.  Plan: Discharge home today after DDAVP and voiding.  Precautions, instructions and follow up were discussed with the patient.  Prescriptions provided per AVS.  Patient has prescription for Stimate and knows to continue this for 3 days as recommended by her hematologist. Patient to call the office to arrange a post-operative appointmant in 2 weeks.   Note: This document was prepared with digital dictation and possible smart phrase technology. Any transcriptional errors that result from this process are  unintentional.  Dara Lords, MD 10/15/2012 2:26 PM

## 2012-10-17 NOTE — Discharge Summary (Signed)
Cassidy Tran 1976/02/16 [id number]   Discharge Summary  Date of Admission:  10/14/2012  Date of Discharge:  10/15/2012  Discharge Diagnosis:  Pelvic pain, endometriosis, adenomyosis  Procedure:  Procedure(s): HYSTERECTOMY TOTAL LAPAROSCOPIC CYSTOSCOPY   Pathology:  1. Uterus and cervix - EARLY SECRETORY / LATE PROLIFERATIVE PATTERN ENDOMETRIUM WITH UNDERLYING MYOMETRIUM SHOWING ADENOMYOSIS. - SEROSAL ADHESIONS PRESENT ASSOCIATED WITH ADENOMYOSIS. - BENIGN CERVIX WITH SCATTERED MILD CHRONIC INFLAMMATION. - NO ATYPIA, DYSPLASIA, HYPERPLASIA OR MALIGNANCY IDENTIFIED WITHIN THE SPECIMEN. 2. Bladder, biopsy, dome endometriotic implants - BENIGN FIBROUS SOFT TISSUE WITH FINDINGS CONSISTENT WITH ENDOMETRIOSIS. - NO ATYPIA OR MALIGNANCY IDENTIFIED.  Hospital Course:  The patient underwent an uncomplicated total laparoscopic hysterectomy 10/14/2012 with findings of endometriosis. The patient was discharged on postoperative day #1 ambulating well, tolerating a regular diet, voiding without difficulty with good pain relief on oral medication. She received preoperative and postoperative DDAVP and will continue on Stimate for 3 days as recommended by her hematologist. The patient received instructions for postoperative care and call precautions.  She received prescriptions per AVS and will be seen in the office 2 weeks following discharge.     Note: This document was prepared with digital dictation and possible smart phrase technology. Any transcriptional errors that result from this process are unintentional.   Dara Lords MD, 8:50 AM 10/17/2012

## 2012-10-22 ENCOUNTER — Telehealth: Payer: Self-pay | Admitting: *Deleted

## 2012-10-22 NOTE — Telephone Encounter (Signed)
Pt is post op Total laparoscopic hysterectomy c/o last night she noticed some blood in her stool, not a lot. Pt advise to make OV, pt said she will wait until post op appointment on 9/31/14 with TF if she should see more blood that before she will come in for visit.

## 2012-10-25 ENCOUNTER — Ambulatory Visit (HOSPITAL_BASED_OUTPATIENT_CLINIC_OR_DEPARTMENT_OTHER): Payer: BC Managed Care – PPO | Admitting: Oncology

## 2012-10-25 VITALS — BP 118/82 | HR 96 | Temp 97.4°F | Resp 18 | Ht 63.0 in | Wt 211.7 lb

## 2012-10-25 DIAGNOSIS — N8000 Endometriosis of the uterus, unspecified: Secondary | ICD-10-CM

## 2012-10-25 DIAGNOSIS — D6800 Von Willebrand disease, unspecified: Secondary | ICD-10-CM

## 2012-10-25 DIAGNOSIS — N949 Unspecified condition associated with female genital organs and menstrual cycle: Secondary | ICD-10-CM

## 2012-10-25 NOTE — Progress Notes (Signed)
Brief followup visit for this pleasant 37 year old woman with a history of von Willebrand's disorder and a chronic seizure disorder. I have not seen her for many years. I reevaluated her recently on August 1. She had a planned elective hysterectomy to control chronic symptoms of endometriosis. I recommended perioperative DDAVP. Surgery proceeded smoothly. No excessive blood loss. She is doing well at this time. I will see her again in the future on an as-needed basis for any advice around surgeries.

## 2012-10-30 ENCOUNTER — Ambulatory Visit (INDEPENDENT_AMBULATORY_CARE_PROVIDER_SITE_OTHER): Payer: BC Managed Care – PPO | Admitting: Gynecology

## 2012-10-30 ENCOUNTER — Encounter: Payer: Self-pay | Admitting: Gynecology

## 2012-10-30 DIAGNOSIS — R319 Hematuria, unspecified: Secondary | ICD-10-CM

## 2012-10-30 DIAGNOSIS — N809 Endometriosis, unspecified: Secondary | ICD-10-CM

## 2012-10-30 DIAGNOSIS — Z9889 Other specified postprocedural states: Secondary | ICD-10-CM

## 2012-10-30 NOTE — Progress Notes (Signed)
Two-week postoperative visit status post TLH doing well. Notes occasional pink staining in her urine. Voiding good amounts without dysuria or frequency urgency or pelvic discomfort. Having regular bowel movements.  Exam with Sherrilyn Rist Asst. Abdomen soft, nontender, incisions healing although umbilical and left suprapubic port sutures still in place and irritating. Sutures were removed. Pelvic external BUS vagina with cuff intact healing nicely. Bimanual without masses or tenderness.  Assessment and plan: 2 weeks status post TLH doing well. Reviewed pathology which showed significant endometriosis/adenomyosis. We'll check urinalysis given slight pink staining to urine occasionally. Suspect secondary to her von Willebrand's and urinary catheter. Slowly resume normal activities with the exception of continued pelvic rest and reexamination in 2 weeks

## 2012-10-30 NOTE — Patient Instructions (Signed)
Followup in 2 weeks for your next postoperative visit. Slowly increase activities with the exception of nothing inside the vagina.

## 2012-10-31 LAB — URINALYSIS W MICROSCOPIC + REFLEX CULTURE
Crystals: NONE SEEN
Leukocytes, UA: NEGATIVE
Nitrite: NEGATIVE
Specific Gravity, Urine: 1.02 (ref 1.005–1.030)
Urobilinogen, UA: 0.2 mg/dL (ref 0.0–1.0)

## 2012-11-15 ENCOUNTER — Ambulatory Visit: Payer: BC Managed Care – PPO | Admitting: Gynecology

## 2012-11-19 ENCOUNTER — Encounter: Payer: Self-pay | Admitting: Gynecology

## 2012-11-19 ENCOUNTER — Ambulatory Visit (INDEPENDENT_AMBULATORY_CARE_PROVIDER_SITE_OTHER): Payer: Self-pay | Admitting: Gynecology

## 2012-11-19 DIAGNOSIS — Z9889 Other specified postprocedural states: Secondary | ICD-10-CM

## 2012-11-19 DIAGNOSIS — N809 Endometriosis, unspecified: Secondary | ICD-10-CM

## 2012-11-19 NOTE — Progress Notes (Signed)
Patient presents at 4 weeks for her postop appointment status post TLH. Without complaints noting that her pelvic pain is better.  Exam with Selena Batten Assistant Abdomen soft nontender without masses guarding rebound organomegaly. Incisions healed nicely. Pelvic external BUS vagina with cuff healing nicely, sutures in place. Bimanual without masses or tenderness.  Assessment and plan: Normal postop check status post TLH in 4 weeks. We'll slowly resume all normal activities with the exception of continued pelvic rest. Will resume intercourse after 2 weeks. We'll followup in November/December when she is due for her annual exam, sooner if any issues.

## 2012-11-19 NOTE — Patient Instructions (Signed)
Followup in November/December for annual exam. Continue with nothing in the vagina for another 2 weeks.

## 2012-12-23 ENCOUNTER — Other Ambulatory Visit: Payer: Self-pay | Admitting: Gynecology

## 2013-01-02 ENCOUNTER — Other Ambulatory Visit: Payer: Self-pay

## 2013-02-03 ENCOUNTER — Encounter: Payer: Self-pay | Admitting: Gynecology

## 2013-02-03 ENCOUNTER — Ambulatory Visit (INDEPENDENT_AMBULATORY_CARE_PROVIDER_SITE_OTHER): Payer: BC Managed Care – PPO | Admitting: Gynecology

## 2013-02-03 ENCOUNTER — Other Ambulatory Visit (HOSPITAL_COMMUNITY)
Admission: RE | Admit: 2013-02-03 | Discharge: 2013-02-03 | Disposition: A | Discharge status: Home / Self Care | Payer: BC Managed Care – PPO | Source: Ambulatory Visit | Attending: Gynecology | Admitting: Gynecology

## 2013-02-03 VITALS — BP 138/88 | Ht 64.0 in | Wt 207.0 lb

## 2013-02-03 DIAGNOSIS — Z01419 Encounter for gynecological examination (general) (routine) without abnormal findings: Secondary | ICD-10-CM | POA: Insufficient documentation

## 2013-02-03 DIAGNOSIS — N809 Endometriosis, unspecified: Secondary | ICD-10-CM

## 2013-02-03 DIAGNOSIS — Z1151 Encounter for screening for human papillomavirus (HPV): Secondary | ICD-10-CM | POA: Insufficient documentation

## 2013-02-03 NOTE — Progress Notes (Signed)
Cassidy Tran 1975/06/29 [id number]        37 y.o.  G0P0 for annual exam.  Several issues noted below.  Past medical history,surgical history, problem list, medications, allergies, family history and social history were all reviewed and documented in the EPIC chart.  ROS:  Performed and pertinent positives and negatives are included in the history, assessment and plan .  Exam: Kim assistant Filed Vitals:   02/03/13 1403  BP: 138/88  Height: 5\' 4"  (1.626 m)  Weight: 207 lb (93.895 kg)   General appearance  Normal Skin grossly normal Head/Neck normal with no cervical or supraclavicular adenopathy thyroid normal Lungs  clear Cardiac RR, without RMG Abdominal  soft, nontender, without masses, organomegaly or hernia Breasts  examined lying and sitting without masses, retractions, discharge or axillary adenopathy. Pelvic  Ext/BUS/vagina  Normal Pap/HPV  Adnexa  Without masses or tenderness    Anus and perineum  Normal   Rectovaginal  Normal sphincter tone without palpated masses or tenderness.    Assessment/Plan:  37 y.o. G0P0 female for annual exam.   1. Status post TLH for endometriosis. Doing well without pain. Continue to monitor. 2. Mammography 06/2012. Areas of calcification on the left that they recommended 6 month followup film. Patient has not scheduled and I reminded her to do so. SBE monthly reviewed. 3. Pap smear. History LGSIL 2012. ASCUS and positive high-risk HPV 2013. Pap of cuff done today with HPV. 4. Health maintenance. Blood pressure 138/88. Reviewed with her and I asked her to have it repeated in a non-exam situation. If it would remain elevated she needs to see her primary physician for evaluation. Patient agrees to do so. No routine blood work done as she reports this all done through her other physician's offices. Followup one year, sooner as needed.   Note: This document was prepared with digital dictation and possible smart phrase technology. Any  transcriptional errors that result from this process are unintentional.   Dara Lords MD, 2:28 PM 02/03/2013

## 2013-02-03 NOTE — Addendum Note (Signed)
Addended by: Dayna Barker on: 02/03/2013 03:15 PM   Modules accepted: Orders

## 2013-02-03 NOTE — Patient Instructions (Signed)
Repeat your blood pressure. If remains elevated followup with your primary physician.  Schedule followup mammogram now as was recommended at your last mammogram appointment.  Followup in one year for annual exam.

## 2013-04-06 ENCOUNTER — Other Ambulatory Visit: Payer: Self-pay | Admitting: Gynecology

## 2013-04-07 NOTE — Telephone Encounter (Signed)
Called into pharmacy

## 2013-04-28 ENCOUNTER — Encounter: Payer: Self-pay | Admitting: Oncology

## 2013-06-25 ENCOUNTER — Other Ambulatory Visit: Payer: Self-pay | Admitting: Gynecology

## 2013-06-26 NOTE — Telephone Encounter (Signed)
Called into pharmacy

## 2013-10-04 ENCOUNTER — Other Ambulatory Visit: Payer: Self-pay | Admitting: Gynecology

## 2013-10-07 NOTE — Telephone Encounter (Signed)
Called into pharmacy

## 2013-12-12 ENCOUNTER — Other Ambulatory Visit: Payer: Self-pay

## 2013-12-29 ENCOUNTER — Ambulatory Visit (INDEPENDENT_AMBULATORY_CARE_PROVIDER_SITE_OTHER): Payer: BC Managed Care – PPO | Admitting: Internal Medicine

## 2013-12-29 VITALS — BP 128/86 | HR 106 | Temp 98.5°F | Resp 20 | Ht 64.0 in | Wt 203.0 lb

## 2013-12-29 DIAGNOSIS — G441 Vascular headache, not elsewhere classified: Secondary | ICD-10-CM

## 2013-12-29 NOTE — Progress Notes (Signed)
Subjective:  This chart was scribed for Cassidy Koyanagi, MD by Ladene Artist, ED Scribe. The patient was seen in room 11. Patient's care was started at 5:07 PM.   Patient ID: Cassidy Tran, female    DOB: 16-May-1975, 38 y.o.   MRN: [id number]  Chief Complaint  Patient presents with  . Head pain    back of head, x 3 months   HPI HPI Comments: Cassidy Tran is a 38 y.o. female, with a h/o migraine and seizure disorder, who presents to the [facility] complaining of intermittent back of the head pain onset 3 months ago. Pt reports that she notices the pain with sneezing, coughing or rolling over in bed. Pt describes the pain as a pulsating pain that lasts for 2-3 minutes. She reports associated "blood rush" in the back of her head that lasts for 10 seconds. She denies pain with bending over or lifting. Has not done any exercise. She also denies associated dizziness, nausea, postnasal drip, rhinorrhea, visual disturbances, appetite change, fatigue, sleep disturbances. Frequency is increasing.  She is followed by neurologist Dr. Joesph July at [facility] for grand mal seizures. Last seiz 8 y ago.  Last 2 mos stressful at work with [employer].  Past Medical History  Diagnosis Date  . Von Willebrand disease   . Asthma   . Smoker   . Cervical dysplasia, mild 08/2010    LGSIL colposcopy biopsy showing koilocytotic atypia  . Seizures     seizure disorder  . Anxiety   . Arthritis     lower back  . Headache(784.0)   . ASCUS (atypical squamous cells of undetermined significance) on Pap smear 03/2011    NEG HR HPV  . Endometriosis   . High risk HPV infection 12/2011    Pap normal  . Allergy   . Clotting disorder   . Depression    Current Outpatient Prescriptions on File Prior to Visit  Medication Sig Dispense Refill  . acetaminophen (TYLENOL) 500 MG tablet Take 1,000 mg by mouth every 6 (six) hours as needed. pain    . amitriptyline (ELAVIL)  25 MG tablet TAKE 1 TABLET BY MOUTH EVERY DAY AT BEDTIME 30 tablet 3  . divalproex (DEPAKOTE) 500 MG DR tablet Take 1,000 mg by mouth 2 (two) times daily.     . Multiple Vitamin (MULITIVITAMIN WITH MINERALS) TABS Take 1 tablet by mouth daily.      [name] zonisamide (ZONEGRAN) 100 MG capsule Take 100 mg by mouth at bedtime.      . levalbuterol (XOPENEX HFA) 45 MCG/ACT inhaler Inhale 1-2 puffs into the lungs every 4 (four) hours as needed for wheezing.     Current Facility-Administered Medications on File Prior to Visit  Medication Dose Route Frequency Provider Last Rate Last Dose  . desmopressin (DDAVP) injection 34.04 mcg  0.3 mcg/kg (Order-Specific) Intravenous Once [name], MD       Allergies  Allergen Reactions  . Aspirin Other (See Comments)    Childhood reaction  . Codeine Nausea And Vomiting  . Erythromycin Other (See Comments)    Childhood reaction  . Nsaids Nausea And Vomiting  . Penicillins Other (See Comments)    Childhood reaction    Review of Systems  Constitutional: Negative for fever and appetite change.  HENT: Negative for postnasal drip and rhinorrhea.   Eyes: Negative for visual disturbance.  Gastrointestinal: Negative for nausea.  Musculoskeletal: Positive for neck pain.  Neurological: Negative for dizziness.  Psychiatric/Behavioral: Negative for sleep disturbance.      Objective:   Physical Exam  Constitutional: She is oriented to person, place, and time. She appears well-developed and well-nourished. No distress.  HENT:  Head: Normocephalic and atraumatic.  Right Ear: External ear normal.  Left Ear: External ear normal.  Nose: Nose normal.  Mouth/Throat: Oropharynx is clear and moist.  Eyes: Conjunctivae and EOM are normal. Pupils are equal, round, and reactive to light.  Neck: Normal range of motion. Neck supple. No thyromegaly present.  Cardiovascular: Normal rate and regular rhythm.   Pulmonary/Chest: Effort normal. No respiratory distress.    Musculoskeletal: Normal range of motion.  Lymphadenopathy:    She has no cervical adenopathy.  Neurological: She is alert and oriented to person, place, and time. She has normal reflexes. No cranial nerve deficit. Coordination normal.  Skin: Skin is warm and dry.  Psychiatric: She has a normal mood and affect. Her behavior is normal. Thought content normal.  Nursing note and vitals reviewed.  Triage Vitals: BP 128/86 mmHg  Pulse 106  Temp(Src) 98.5 F (36.9 C)  Resp 20  Ht 5\' 4"  (1.626 m)  Wt 203 lb (92.08 kg)  BMI 34.83 kg/m2  SpO2 97%  LMP 09/16/2012    Assessment & Plan:   I personally performed the services described in this documentation, which was scribed in my presence. The recorded information has been reviewed and is accurate.  Exertional type HA--pulsatile Cough assoc HA--more abrupt onset/short duration No focal symptoms or signs Diff Dx= ?Chiari type 1 malformations with or without syringomyelia [5-8] ?Posterior fossa lesions [2,9] ?Unruptured cerebral aneurysms [10] ?Intracranial hypervolemia [11] ?Intracranial low pressure state (with positional headaches) [12] Needs brainMRI w/ contrast

## 2013-12-29 NOTE — Addendum Note (Signed)
Addended by: [name] on: 12/29/2013 10:13 PM   Modules accepted: Level of Service

## 2014-01-09 ENCOUNTER — Ambulatory Visit
Admission: RE | Admit: 2014-01-09 | Discharge: 2014-01-09 | Disposition: A | Discharge status: Home / Self Care | Payer: BC Managed Care – PPO | Source: Ambulatory Visit | Attending: Internal Medicine | Admitting: Internal Medicine

## 2014-01-09 DIAGNOSIS — G441 Vascular headache, not elsewhere classified: Secondary | ICD-10-CM

## 2014-01-09 MED ORDER — GADOBENATE DIMEGLUMINE 529 MG/ML IV SOLN
19.0000 mL | Freq: Once | INTRAVENOUS | Status: AC | PRN
  Administered 2014-01-09: 19 mL via INTRAVENOUS

## 2014-06-09 ENCOUNTER — Ambulatory Visit (INDEPENDENT_AMBULATORY_CARE_PROVIDER_SITE_OTHER): Payer: BLUE CROSS/BLUE SHIELD

## 2014-06-09 ENCOUNTER — Ambulatory Visit (INDEPENDENT_AMBULATORY_CARE_PROVIDER_SITE_OTHER): Payer: BLUE CROSS/BLUE SHIELD | Admitting: Women's Health

## 2014-06-09 ENCOUNTER — Other Ambulatory Visit: Payer: Self-pay | Admitting: Women's Health

## 2014-06-09 ENCOUNTER — Encounter: Payer: Self-pay | Admitting: Women's Health

## 2014-06-09 VITALS — BP 140/80 | Ht 64.0 in | Wt 193.0 lb

## 2014-06-09 DIAGNOSIS — R109 Unspecified abdominal pain: Secondary | ICD-10-CM

## 2014-06-09 DIAGNOSIS — N8300 Follicular cyst of ovary, unspecified side: Secondary | ICD-10-CM

## 2014-06-09 DIAGNOSIS — R350 Frequency of micturition: Secondary | ICD-10-CM | POA: Diagnosis not present

## 2014-06-09 LAB — URINALYSIS, MICROSCOPIC ONLY
CASTS: NONE SEEN
CRYSTALS: NONE SEEN

## 2014-06-09 LAB — URINALYSIS, ROUTINE W REFLEX MICROSCOPIC
GLUCOSE, UA: NEGATIVE mg/dL
Ketones, ur: NEGATIVE mg/dL
LEUKOCYTES UA: NEGATIVE
Nitrite: NEGATIVE
PROTEIN: 30 mg/dL — AB
Specific Gravity, Urine: >1.03 — ABNORMAL HIGH (ref 1.005–1.030)
Urobilinogen, UA: 0.2 mg/dL (ref 0.0–1.0)
pH: 5 (ref 5.0–8.0)

## 2014-06-09 LAB — WET PREP FOR TRICH, YEAST, CLUE
Clue Cells Wet Prep HPF POC: NONE SEEN
Trich, Wet Prep: NONE SEEN
Yeast Wet Prep HPF POC: NONE SEEN

## 2014-06-09 NOTE — Patient Instructions (Signed)
Pelvic Pain Female pelvic pain can be caused by many different things and start from a variety of places. Pelvic pain refers to pain that is located in the lower half of the abdomen and between your hips. The pain may occur over a short period of time (acute) or may be reoccurring (chronic). The cause of pelvic pain may be related to disorders affecting the female reproductive organs (gynecologic), but it may also be related to the bladder, kidney stones, an intestinal complication, or muscle or skeletal problems. Getting help right away for pelvic pain is important, especially if there has been severe, sharp, or a sudden onset of unusual pain. It is also important to get help right away because some types of pelvic pain can be life threatening.  CAUSES  Below are only some of the causes of pelvic pain. The causes of pelvic pain can be in one of several categories.   Gynecologic.  Pelvic inflammatory disease.  Sexually transmitted infection.  Ovarian cyst or a twisted ovarian ligament (ovarian torsion).  Uterine lining that grows outside the uterus (endometriosis).  Fibroids, cysts, or tumors.  Ovulation.  Pregnancy.  Pregnancy that occurs outside the uterus (ectopic pregnancy).  Miscarriage.  Labor.  Abruption of the placenta or ruptured uterus.  Infection.  Uterine infection (endometritis).  Bladder infection.  Diverticulitis.  Miscarriage related to a uterine infection (septic abortion).  Bladder.  Inflammation of the bladder (cystitis).  Kidney stone(s).  Gastrointestinal.  Constipation.  Diverticulitis.  Neurologic.  Trauma.  Feeling pelvic pain because of mental or emotional causes (psychosomatic).  Cancers of the bowel or pelvis. EVALUATION  Your caregiver will want to take a careful history of your concerns. This includes recent changes in your health, a careful gynecologic history of your periods (menses), and a sexual history. Obtaining your family  history and medical history is also important. Your caregiver may suggest a pelvic exam. A pelvic exam will help identify the location and severity of the pain. It also helps in the evaluation of which organ system may be involved. In order to identify the cause of the pelvic pain and be properly treated, your caregiver may order tests. These tests may include:   A pregnancy test.  Pelvic ultrasonography.  An X-ray exam of the abdomen.  A urinalysis or evaluation of vaginal discharge.  Blood tests. HOME CARE INSTRUCTIONS   Only take over-the-counter or prescription medicines for pain, discomfort, or fever as directed by your caregiver.   Rest as directed by your caregiver.   Eat a balanced diet.   Drink enough fluids to make your urine clear or pale yellow, or as directed.   Avoid sexual intercourse if it causes pain.   Apply warm or cold compresses to the lower abdomen depending on which one helps the pain.   Avoid stressful situations.   Keep a journal of your pelvic pain. Write down when it started, where the pain is located, and if there are things that seem to be associated with the pain, such as food or your menstrual cycle.  Follow up with your caregiver as directed.  SEEK MEDICAL CARE IF:  Your medicine does not help your pain.  You have abnormal vaginal discharge. SEEK IMMEDIATE MEDICAL CARE IF:   You have heavy bleeding from the vagina.   Your pelvic pain increases.   You feel light-headed or faint.   You have chills.   You have pain with urination or blood in your urine.   You have uncontrolled diarrhea   or vomiting.   You have a fever or persistent symptoms for more than 3 days.  You have a fever and your symptoms suddenly get worse.   You are being physically or sexually abused.  MAKE SURE YOU:  Understand these instructions.  Will watch your condition.  Will get help if you are not doing well or get worse. Document Released:  01/11/2004 Document Revised: 06/30/2013 Document Reviewed: 06/05/2011 ExitCare Patient Information 2015 [employer]. This information is not intended to replace advice given to you by your health care provider. Make sure you discuss any questions you have with your health care provider.  

## 2014-06-09 NOTE — Progress Notes (Signed)
Patient ID: Cassidy Tran, female   DOB: 1975-12-09, 39 y.o.   MRN: [id number] Presents with complaint of intense low abdominal/pelvic pressure that started earlier today minimal relief with Tylenol. Denies urinary symptoms - no pain, burning, frequency or urgency with urination. Denies vaginal discharge, fever, GI changes, constipation. Same partner. TVH for endometriosis 2014.  Exam: Appears uncomfortable. Abdomen soft without rebound. Generalized tenderness in lower abdomen. External genitalia within normal limits, speculum exam scant white discharge wet prep negative. Bimanual tenderness both right and left greater on left. Ultrasound: Status post hysterectomy. Right ovary normal follicle 11 mm. Left ovary follicles 9 mm or less. +CFD arterial blood flow seen to  bilateral ovarian tissue. Negative cul-de-sac. UA: Small ketones, trace blood, 0-2 RBCs, 0-2 WBCs many bacteria  Normal GYN exam Pelvic pressure  Plan: Rest today, Tylenol 2 tablets every 6 hours, if pain persists instructed to call for referal to GI for evaluation. Keep scheduled annual exam appointment with Dr. Phineas Real. Urine culture pending.

## 2014-06-11 LAB — URINE CULTURE

## 2014-07-29 ENCOUNTER — Ambulatory Visit (INDEPENDENT_AMBULATORY_CARE_PROVIDER_SITE_OTHER): Payer: BLUE CROSS/BLUE SHIELD | Admitting: Gynecology

## 2014-07-29 ENCOUNTER — Other Ambulatory Visit (HOSPITAL_COMMUNITY)
Admission: RE | Admit: 2014-07-29 | Discharge: 2014-07-29 | Disposition: A | Discharge status: Home / Self Care | Payer: BLUE CROSS/BLUE SHIELD | Source: Ambulatory Visit | Attending: Gynecology | Admitting: Gynecology

## 2014-07-29 ENCOUNTER — Encounter: Payer: Self-pay | Admitting: Gynecology

## 2014-07-29 VITALS — BP 120/80 | Ht 64.0 in | Wt 189.0 lb

## 2014-07-29 DIAGNOSIS — Z01419 Encounter for gynecological examination (general) (routine) without abnormal findings: Secondary | ICD-10-CM

## 2014-07-29 NOTE — Patient Instructions (Signed)
Follow up for your mammogram.  Cut down on your cigarette smoking.  You may obtain a copy of any labs that were done today by logging onto MyChart as outlined in the instructions provided with your AVS (after visit summary). The office will not call with normal lab results but certainly if there are any significant abnormalities then we will contact you.   Health Maintenance, Female A healthy lifestyle and preventative care can promote health and wellness.  Maintain regular health, dental, and eye exams.  Eat a healthy diet. Foods like vegetables, fruits, whole grains, low-fat dairy products, and lean protein foods contain the nutrients you need without too many calories. Decrease your intake of foods high in solid fats, added sugars, and salt. Get information about a proper diet from your caregiver, if necessary.  Regular physical exercise is one of the most important things you can do for your health. Most adults should get at least 150 minutes of moderate-intensity exercise (any activity that increases your heart rate and causes you to sweat) each week. In addition, most adults need muscle-strengthening exercises on 2 or more days a week.   Maintain a healthy weight. The body mass index (BMI) is a screening tool to identify possible weight problems. It provides an estimate of body fat based on height and weight. Your caregiver can help determine your BMI, and can help you achieve or maintain a healthy weight. For adults 20 years and older:  A BMI below 18.5 is considered underweight.  A BMI of 18.5 to 24.9 is normal.  A BMI of 25 to 29.9 is considered overweight.  A BMI of 30 and above is considered obese.  Maintain normal blood lipids and cholesterol by exercising and minimizing your intake of saturated fat. Eat a balanced diet with plenty of fruits and vegetables. Blood tests for lipids and cholesterol should begin at age 62 and be repeated every 5 years. If your lipid or cholesterol  levels are high, you are over 50, or you are a high risk for heart disease, you may need your cholesterol levels checked more frequently.Ongoing high lipid and cholesterol levels should be treated with medicines if diet and exercise are not effective.  If you smoke, find out from your caregiver how to quit. If you do not use tobacco, do not start.  Lung cancer screening is recommended for adults aged 67 80 years who are at high risk for developing lung cancer because of a history of smoking. Yearly low-dose computed tomography (CT) is recommended for people who have at least a 30-pack-year history of smoking and are a current smoker or have quit within the past 15 years. A pack year of smoking is smoking an average of 1 pack of cigarettes a day for 1 year (for example: 1 pack a day for 30 years or 2 packs a day for 15 years). Yearly screening should continue until the smoker has stopped smoking for at least 15 years. Yearly screening should also be stopped for people who develop a health problem that would prevent them from having lung cancer treatment.  If you are pregnant, do not drink alcohol. If you are breastfeeding, be very cautious about drinking alcohol. If you are not pregnant and choose to drink alcohol, do not exceed 1 drink per day. One drink is considered to be 12 ounces (355 mL) of beer, 5 ounces (148 mL) of wine, or 1.5 ounces (44 mL) of liquor.  Avoid use of street drugs. Do not share needles  with anyone. Ask for help if you need support or instructions about stopping the use of drugs.  High blood pressure causes heart disease and increases the risk of stroke. Blood pressure should be checked at least every 1 to 2 years. Ongoing high blood pressure should be treated with medicines, if weight loss and exercise are not effective.  If you are 6 to 41 years old, ask your caregiver if you should take aspirin to prevent strokes.  Diabetes screening involves taking a blood sample to check  your fasting blood sugar level. This should be done once every 3 years, after age 12, if you are within normal weight and without risk factors for diabetes. Testing should be considered at a younger age or be carried out more frequently if you are overweight and have at least 1 risk factor for diabetes.  Breast cancer screening is essential preventative care for women. You should practice "breast self-awareness." This means understanding the normal appearance and feel of your breasts and may include breast self-examination. Any changes detected, no matter how small, should be reported to a caregiver. Women in their 49s and 30s should have a clinical breast exam (CBE) by a caregiver as part of a regular health exam every 1 to 3 years. After age 27, women should have a CBE every year. Starting at age 62, women should consider having a mammogram (breast X-ray) every year. Women who have a family history of breast cancer should talk to their caregiver about genetic screening. Women at a high risk of breast cancer should talk to their caregiver about having an MRI and a mammogram every year.  Breast cancer gene (BRCA)-related cancer risk assessment is recommended for women who have family members with BRCA-related cancers. BRCA-related cancers include breast, ovarian, tubal, and peritoneal cancers. Having family members with these cancers may be associated with an increased risk for harmful changes (mutations) in the breast cancer genes BRCA1 and BRCA2. Results of the assessment will determine the need for genetic counseling and BRCA1 and BRCA2 testing.  The Pap test is a screening test for cervical cancer. Women should have a Pap test starting at age 69. Between ages 58 and 43, Pap tests should be repeated every 2 years. Beginning at age 24, you should have a Pap test every 3 years as long as the past 3 Pap tests have been normal. If you had a hysterectomy for a problem that was not cancer or a condition that  could lead to cancer, then you no longer need Pap tests. If you are between ages 62 and 63, and you have had normal Pap tests going back 10 years, you no longer need Pap tests. If you have had past treatment for cervical cancer or a condition that could lead to cancer, you need Pap tests and screening for cancer for at least 20 years after your treatment. If Pap tests have been discontinued, risk factors (such as a new sexual partner) need to be reassessed to determine if screening should be resumed. Some women have medical problems that increase the chance of getting cervical cancer. In these cases, your caregiver may recommend more frequent screening and Pap tests.  The human papillomavirus (HPV) test is an additional test that may be used for cervical cancer screening. The HPV test looks for the virus that can cause the cell changes on the cervix. The cells collected during the Pap test can be tested for HPV. The HPV test could be used to screen women aged  30 years and older, and should be used in women of any age who have unclear Pap test results. After the age of 30, women should have HPV testing at the same frequency as a Pap test.  Colorectal cancer can be detected and often prevented. Most routine colorectal cancer screening begins at the age of 50 and continues through age 75. However, your caregiver may recommend screening at an earlier age if you have risk factors for colon cancer. On a yearly basis, your caregiver may provide home test kits to check for hidden blood in the stool. Use of a small camera at the end of a tube, to directly examine the colon (sigmoidoscopy or colonoscopy), can detect the earliest forms of colorectal cancer. Talk to your caregiver about this at age 50, when routine screening begins. Direct examination of the colon should be repeated every 5 to 10 years through age 75, unless early forms of pre-cancerous polyps or small growths are found.  Hepatitis C blood testing is  recommended for all people born from 1945 through 1965 and any individual with known risks for hepatitis C.  Practice safe sex. Use condoms and avoid high-risk sexual practices to reduce the spread of sexually transmitted infections (STIs). Sexually active women aged 25 and younger should be checked for Chlamydia, which is a common sexually transmitted infection. Older women with new or multiple partners should also be tested for Chlamydia. Testing for other STIs is recommended if you are sexually active and at increased risk.  Osteoporosis is a disease in which the bones lose minerals and strength with aging. This can result in serious bone fractures. The risk of osteoporosis can be identified using a bone density scan. Women ages 65 and over and women at risk for fractures or osteoporosis should discuss screening with their caregivers. Ask your caregiver whether you should be taking a calcium supplement or vitamin D to reduce the rate of osteoporosis.  Menopause can be associated with physical symptoms and risks. Hormone replacement therapy is available to decrease symptoms and risks. You should talk to your caregiver about whether hormone replacement therapy is right for you.  Use sunscreen. Apply sunscreen liberally and repeatedly throughout the day. You should seek shade when your shadow is shorter than you. Protect yourself by wearing long sleeves, pants, a wide-brimmed hat, and sunglasses year round, whenever you are outdoors.  Notify your caregiver of new moles or changes in moles, especially if there is a change in shape or color. Also notify your caregiver if a mole is larger than the size of a pencil eraser.  Stay current with your immunizations. Document Released: 08/29/2010 Document Revised: 06/10/2012 Document Reviewed: 08/29/2010 [employer] Patient Information 2014 [employer].   

## 2014-07-29 NOTE — Addendum Note (Signed)
Addended by: Nelva Nay on: 07/29/2014 10:01 AM   Modules accepted: Orders

## 2014-07-29 NOTE — Progress Notes (Signed)
Cassidy Tran 06-19-1975 [id number]        38 y.o.  G0P0 for annual exam.  Doing well without complaints.  Past medical history,surgical history, problem list, medications, allergies, family history and social history were all reviewed and documented as reviewed in the EPIC chart.  ROS:  Performed with pertinent positives and negatives included in the history, assessment and plan.   Additional significant findings :  none   Exam: Kim Counsellor Vitals:   07/29/14 0858  BP: 120/80  Height: 5\' 4"  (1.626 m)  Weight: 189 lb (85.73 kg)   General appearance:  Normal affect, orientation and appearance. Skin: Grossly normal HEENT: Without gross lesions.  No cervical or supraclavicular adenopathy. Thyroid normal.  Lungs:  Clear without wheezing, rales or rhonchi Cardiac: RR, without RMG Abdominal:  Soft, nontender, without masses, guarding, rebound, organomegaly or hernia Breasts:  Examined lying and sitting without masses, retractions, discharge or axillary adenopathy. Pelvic:  Ext/BUS/vagina normal. Pap smear of cuff done  Adnexa  Without masses or tenderness    Anus and perineum  Normal   Rectovaginal  Normal sphincter tone without palpated masses or tenderness.    Assessment/Plan:  39 y.o. G0P0 female for annual exam.   1. History of TLH for endometriosis. Doing well without significant discomfort. Continue to monitor. 2. Pap smear/HPV negative 2014.  Pap smear of vaginal cuff today. History of LGSIL 2012. ASCUS positive high-risk HPV 2013. 3. Mammography 2014. Area of calcification on the left they recommended a 6 month follow up. She knows this but never followed up for this. I strongly recommended she follow up for bilateral mammography now. Patient acknowledges my recommendations and understands the need for follow up/rule out cancer. SBE monthly reviewed. 4. Stop smoking again stressed. Strategies to achieve this reviewed. Patient not committing to this at this  point. 5. Health maintenance. No routine blood work done as patient reports this done at her primary physician's office. Follow up 1 year, sooner as needed     [name] MD, 9:22 AM 07/29/2014

## 2014-07-30 LAB — CYTOLOGY - PAP

## 2014-12-11 ENCOUNTER — Other Ambulatory Visit: Payer: Self-pay | Admitting: Pediatrics

## 2014-12-26 ENCOUNTER — Other Ambulatory Visit: Payer: Self-pay | Admitting: Pediatrics

## 2014-12-28 ENCOUNTER — Other Ambulatory Visit: Payer: Self-pay | Admitting: Allergy

## 2015-01-14 ENCOUNTER — Other Ambulatory Visit: Payer: Self-pay | Admitting: Pediatrics

## 2015-02-09 ENCOUNTER — Other Ambulatory Visit: Payer: Self-pay | Admitting: Pediatrics

## 2015-03-10 ENCOUNTER — Other Ambulatory Visit: Payer: Self-pay | Admitting: Pediatrics

## 2015-04-04 ENCOUNTER — Other Ambulatory Visit: Payer: Self-pay | Admitting: Pediatrics

## 2015-04-24 ENCOUNTER — Encounter (HOSPITAL_COMMUNITY): Payer: Self-pay | Admitting: Emergency Medicine

## 2015-04-24 ENCOUNTER — Emergency Department (HOSPITAL_COMMUNITY)
Admission: EM | Admit: 2015-04-24 | Discharge: 2015-04-24 | Disposition: A | Discharge status: Home / Self Care | Payer: BLUE CROSS/BLUE SHIELD | Attending: Emergency Medicine | Admitting: Emergency Medicine

## 2015-04-24 ENCOUNTER — Emergency Department (HOSPITAL_COMMUNITY): Payer: BLUE CROSS/BLUE SHIELD

## 2015-04-24 DIAGNOSIS — J159 Unspecified bacterial pneumonia: Secondary | ICD-10-CM | POA: Insufficient documentation

## 2015-04-24 DIAGNOSIS — J45909 Unspecified asthma, uncomplicated: Secondary | ICD-10-CM | POA: Diagnosis not present

## 2015-04-24 DIAGNOSIS — R05 Cough: Secondary | ICD-10-CM | POA: Diagnosis present

## 2015-04-24 DIAGNOSIS — Z862 Personal history of diseases of the blood and blood-forming organs and certain disorders involving the immune mechanism: Secondary | ICD-10-CM | POA: Diagnosis not present

## 2015-04-24 DIAGNOSIS — F329 Major depressive disorder, single episode, unspecified: Secondary | ICD-10-CM | POA: Insufficient documentation

## 2015-04-24 DIAGNOSIS — M47896 Other spondylosis, lumbar region: Secondary | ICD-10-CM | POA: Insufficient documentation

## 2015-04-24 DIAGNOSIS — J189 Pneumonia, unspecified organism: Secondary | ICD-10-CM

## 2015-04-24 DIAGNOSIS — Z79899 Other long term (current) drug therapy: Secondary | ICD-10-CM | POA: Diagnosis not present

## 2015-04-24 DIAGNOSIS — F17210 Nicotine dependence, cigarettes, uncomplicated: Secondary | ICD-10-CM | POA: Insufficient documentation

## 2015-04-24 DIAGNOSIS — Z8742 Personal history of other diseases of the female genital tract: Secondary | ICD-10-CM | POA: Insufficient documentation

## 2015-04-24 DIAGNOSIS — Z8619 Personal history of other infectious and parasitic diseases: Secondary | ICD-10-CM | POA: Insufficient documentation

## 2015-04-24 DIAGNOSIS — Z880 Allergy status to penicillin: Secondary | ICD-10-CM | POA: Insufficient documentation

## 2015-04-24 MED ORDER — AZITHROMYCIN 250 MG PO TABS
500.0000 mg | ORAL_TABLET | Freq: Once | ORAL | Status: AC
  Administered 2015-04-24: 500 mg via ORAL
  Filled 2015-04-24: qty 2

## 2015-04-24 MED ORDER — ALBUTEROL SULFATE HFA 108 (90 BASE) MCG/ACT IN AERS: 1.0000 | INHALATION_SPRAY | Freq: Four times a day (QID) | RESPIRATORY_TRACT | Status: DC | PRN

## 2015-04-24 MED ORDER — PREDNISONE 20 MG PO TABS
60.0000 mg | ORAL_TABLET | Freq: Once | ORAL | Status: AC
  Administered 2015-04-24: 60 mg via ORAL
  Filled 2015-04-24: qty 3

## 2015-04-24 MED ORDER — ALBUTEROL SULFATE (2.5 MG/3ML) 0.083% IN NEBU
5.0000 mg | INHALATION_SOLUTION | Freq: Once | RESPIRATORY_TRACT | Status: AC
  Administered 2015-04-24: 5 mg via RESPIRATORY_TRACT
  Filled 2015-04-24: qty 6

## 2015-04-24 MED ORDER — HYDROCOD POLST-CPM POLST ER 10-8 MG/5ML PO SUER: 5.0000 mL | Freq: Two times a day (BID) | ORAL | Status: DC | PRN

## 2015-04-24 MED ORDER — AZITHROMYCIN 250 MG PO TABS: ORAL_TABLET | ORAL | Status: DC

## 2015-04-24 MED ORDER — IPRATROPIUM-ALBUTEROL 0.5-2.5 (3) MG/3ML IN SOLN
3.0000 mL | Freq: Once | RESPIRATORY_TRACT | Status: AC
  Administered 2015-04-24: 3 mL via RESPIRATORY_TRACT
  Filled 2015-04-24: qty 3

## 2015-04-24 MED ORDER — PREDNISONE 50 MG PO TABS: ORAL_TABLET | ORAL | Status: DC

## 2015-04-24 NOTE — Discharge Instructions (Signed)
Community-Acquired Pneumonia, Adult Pneumonia is an infection of the lungs. One type of pneumonia can happen while a person is in a hospital. A different type can happen when a person is not in a hospital (community-acquired pneumonia). It is easy for this kind to spread from person to person. It can spread to you if you breathe near an infected person who coughs or sneezes. Some symptoms include:  A dry cough.  A wet (productive) cough.  Fever.  Sweating.  Chest pain. HOME CARE  Take over-the-counter and prescription medicines only as told by your doctor.  Only take cough medicine if you are losing sleep.  If you were prescribed an antibiotic medicine, take it as told by your doctor. Do not stop taking the antibiotic even if you start to feel better.  Sleep with your head and neck raised (elevated). You can do this by putting a few pillows under your head, or you can sleep in a recliner.  Do not use tobacco products. These include cigarettes, chewing tobacco, and e-cigarettes. If you need help quitting, ask your doctor.  Drink enough water to keep your pee (urine) clear or pale yellow. A shot (vaccine) can help prevent pneumonia. Shots are often suggested for:  People older than 42 years of age.  People older than 42 years of age:  Who are having cancer treatment.  Who have long-term (chronic) lung disease.  Who have problems with their body's defense system (immune system). You may also prevent pneumonia if you take these actions:  Get the flu (influenza) shot every year.  Go to the dentist as often as told.  Wash your hands often. If soap and water are not available, use hand sanitizer. GET HELP IF:  You have a fever.  You lose sleep because your cough medicine does not help. GET HELP RIGHT AWAY IF:  You are short of breath and it gets worse.  You have more chest pain.  Your sickness gets worse. This is very serious if:  You are an older adult.  Your  body's defense system is weak.  You cough up blood.   This information is not intended to replace advice given to you by your health care provider. Make sure you discuss any questions you have with your health care provider.   Document Released: 08/02/2007 Document Revised: 11/04/2014 Document Reviewed: 06/10/2014 Elsevier Interactive Patient Education 2016 Cassidy Tran have some pneumonia in your right lung. Prescription for antibiotic, prednisone, cough syrup, albuterol. Increase fluids. Rest. Tylenol.

## 2015-04-24 NOTE — ED Provider Notes (Signed)
CSN: [id number]     Arrival date & time 04/24/15  [id number] History   First MD Initiated Contact with Patient 04/24/15 [phone]     Chief Complaint  Patient presents with  . Asthma  . URI     (Consider location/radiation/quality/duration/timing/severity/associated sxs/prior Treatment) HPI...Marland KitchenMarland Kitchen Prodromal upper respiratory infection with coughing and wheezing for several days. She recently quit smoking. No fevers, sweats, chills, rusty sputum. She has used her albuterol inhaler at home with moderate results. Severity of symptoms is moderate.  Past Medical History  Diagnosis Date  . Von Willebrand disease ([place])   . Asthma   . Smoker   . Cervical dysplasia, mild 08/2010    LGSIL colposcopy biopsy showing koilocytotic atypia  . Seizures ([place])     seizure disorder  . Anxiety   . Arthritis     lower back  . Headache(784.0)   . ASCUS (atypical squamous cells of undetermined significance) on Pap smear 03/2011    NEG HR HPV  . Endometriosis   . High risk HPV infection 12/2011    Pap normal  . Allergy   . Clotting disorder ([place])   . Depression    Past Surgical History  Procedure Laterality Date  . Nasal sinus surgery  1993    IN [place], [place]  . Wisdom tooth extraction  1993  . Tonsillectomy    . Hysteroscopy w/d&c  10/14/2010    Procedure: DILATATION AND CURETTAGE (D&C) /HYSTEROSCOPY;  Surgeon: [name], MD;  Location: [facility];  Service: Gynecology;  Laterality: N/A;  . Laparoscopy  04/27/2011    Procedure: LAPAROSCOPY OPERATIVE;  Surgeon: [name], MD;  Location: [facility];  Service: Gynecology;  Laterality: N/A;  removal right cyst , lysis of adhesions, biopsy of peritoneum  . Colposcopy    . Dilation and curettage of uterus    . Laparoscopic hysterectomy N/A 10/14/2012    Procedure: HYSTERECTOMY TOTAL LAPAROSCOPIC;  Surgeon: [name], MD;  Location: [facility];  Service: Gynecology;  Laterality: N/A;  CPT [id number]  2 1/2 hours  Dr. Uvaldo Rising to assist.  .  Cystoscopy N/A 10/14/2012    Procedure: CYSTOSCOPY;  Surgeon: [name], MD;  Location: [facility];  Service: Gynecology;  Laterality: N/A;  . Vaginal hysterectomy     Family History  Problem Relation Age of Onset  . Hypertension Mother   . Heart disease Mother   . Hyperlipidemia Mother   . Diabetes Father   . Hypertension Father   . Heart disease Father   . Hyperlipidemia Father   . Stroke Father   . Lymphoma Sister     NON-HODGKINS LYMPHOMA  . Cancer Maternal Uncle     Lung and pancreatic cancer   Social History  Substance Use Topics  . Smoking status: Current Every Day Smoker -- 0.25 packs/day    Types: Cigarettes  . Smokeless tobacco: Never Used  . Alcohol Use: No   OB History    Gravida Para Term Preterm AB TAB SAB Ectopic Multiple Living   0              Review of Systems  All other systems reviewed and are negative.     Allergies  Aspirin; Codeine; Erythromycin; Nsaids; and Penicillins  Home Medications   Prior to Admission medications   Medication Sig Start Date End Date Taking? Authorizing Provider  acetaminophen (TYLENOL) 500 MG tablet Take 1,000 mg by mouth every 6 (six) hours as needed. pain   Yes Historical Provider,  MD  ADVAIR HFA 115-21 MCG/ACT inhaler INHALE 2 PUFFS BY MOUTH EVERY 12 HOURS TO PREVENT COUGH OR WHEEZE 12/28/14  Yes Charlies Silvers, MD  divalproex (DEPAKOTE) 500 MG DR tablet Take 1,000 mg by mouth 2 (two) times daily.    Yes Historical Provider, MD  zonisamide (ZONEGRAN) 100 MG capsule Take 100 mg by mouth at bedtime.     Yes Historical Provider, MD  albuterol (PROVENTIL HFA;VENTOLIN HFA) 108 (90 Base) MCG/ACT inhaler Inhale 1-2 puffs into the lungs every 6 (six) hours as needed for wheezing or shortness of breath. 04/24/15   Nat Christen, MD  azithromycin (ZITHROMAX Z-PAK) 250 MG tablet One tab daily starting Sunday afternoon 04/24/15   Nat Christen, MD  chlorpheniramine-HYDROcodone [facility] ER) 10-8 MG/5ML SUER Take 5 mLs  by mouth every 12 (twelve) hours as needed for cough. 04/24/15   Nat Christen, MD  predniSONE (DELTASONE) 50 MG tablet 1 tablet daily for 6 days 04/24/15   Nat Christen, MD   BP 152/79 mmHg  Pulse 113  Temp(Src) 100.6 F (38.1 C) (Oral)  Resp 22  SpO2 91%  LMP 09/16/2012 Physical Exam  Constitutional: She is oriented to person, place, and time. She appears well-developed and well-nourished.  HENT:  Head: Normocephalic and atraumatic.  Eyes: Conjunctivae and EOM are normal. Pupils are equal, round, and reactive to light.  Neck: Normal range of motion. Neck supple.  Cardiovascular: Normal rate and regular rhythm.   Pulmonary/Chest: Effort normal and breath sounds normal.  Decreased breath sounds on right base.  Abdominal: Soft. Bowel sounds are normal.  Musculoskeletal: Normal range of motion.  Neurological: She is alert and oriented to person, place, and time.  Skin: Skin is warm and dry.  Psychiatric: She has a normal mood and affect. Her behavior is normal.  Nursing note and vitals reviewed.   ED Course  Procedures (including critical care time) Labs Review Labs Reviewed - No data to display  Imaging Review Dg Chest 2 View  04/24/2015  CLINICAL DATA:  Cough, wheezing EXAM: CHEST  2 VIEW COMPARISON:  02/14/2011 FINDINGS: Mild peribronchial thickening and interstitial prominence in the lower lungs. Patchy airspace disease in the right lower lung likely in the right middle lobe on the lateral view. This could reflect early pneumonia. No confluent opacity on the left. No effusions. Heart is normal size. IMPRESSION: Mild bronchitic changes. Patchy right middle lobe airspace disease concerning for pneumonia. Electronically Signed   By: Rolm Baptise M.D.   On: 04/24/2015 10:45   I have personally reviewed and evaluated these images and lab results as part of my medical decision-making.   EKG Interpretation None      MDM   Final diagnoses:  CAP (community acquired pneumonia)     Chest x-ray reveals a right middle lobe pneumonia. She is hemodynamically stable. Rx Zithromax, prednisone, albuterol, Tussionex.  Return if worse.    Nat Christen, MD 04/24/15 [phone]

## 2015-04-24 NOTE — ED Notes (Addendum)
Pt reports she has had a URI for the past few days with a cough. This has caused her asthma to flare up. Pt speaking in short sentences in triage. No audible wheezing. O2 sat 88% on RA. Has used 4 puffs of albuterol inhaler today

## 2015-04-30 ENCOUNTER — Ambulatory Visit (INDEPENDENT_AMBULATORY_CARE_PROVIDER_SITE_OTHER): Payer: BLUE CROSS/BLUE SHIELD | Admitting: Internal Medicine

## 2015-04-30 VITALS — BP 134/72 | HR 105 | Temp 98.3°F | Resp 22 | Ht 63.5 in | Wt 193.0 lb

## 2015-04-30 DIAGNOSIS — J4541 Moderate persistent asthma with (acute) exacerbation: Secondary | ICD-10-CM

## 2015-04-30 DIAGNOSIS — J189 Pneumonia, unspecified organism: Secondary | ICD-10-CM

## 2015-04-30 DIAGNOSIS — J029 Acute pharyngitis, unspecified: Secondary | ICD-10-CM | POA: Diagnosis not present

## 2015-04-30 LAB — POCT SKIN KOH: SKIN KOH, POC: NEGATIVE

## 2015-04-30 LAB — POCT RAPID STREP A (OFFICE): Rapid Strep A Screen: NEGATIVE

## 2015-04-30 MED ORDER — ALBUTEROL SULFATE (2.5 MG/3ML) 0.083% IN NEBU
2.5000 mg | INHALATION_SOLUTION | Freq: Once | RESPIRATORY_TRACT | Status: AC
  Administered 2015-04-30: 2.5 mg via RESPIRATORY_TRACT

## 2015-04-30 MED ORDER — AZITHROMYCIN 500 MG PO TABS: 500.0000 mg | ORAL_TABLET | Freq: Every day | ORAL | Status: DC

## 2015-04-30 MED ORDER — BECLOMETHASONE DIPROPIONATE 40 MCG/ACT IN AERS: 2.0000 | INHALATION_SPRAY | Freq: Two times a day (BID) | RESPIRATORY_TRACT | Status: DC

## 2015-04-30 MED ORDER — PREDNISONE 20 MG PO TABS: ORAL_TABLET | ORAL | Status: DC

## 2015-04-30 MED ORDER — HYDROCODONE-HOMATROPINE 5-1.5 MG/5ML PO SYRP: 5.0000 mL | ORAL_SOLUTION | Freq: Four times a day (QID) | ORAL | Status: DC | PRN

## 2015-04-30 NOTE — Progress Notes (Signed)
Subjective:  By signing my name below, I, Cassidy Tran, attest that this documentation has been prepared under the direction and in the presence of Cassidy Koyanagi, MD Electronically Signed: Ladene Tran, ED Scribe 04/30/2015 at 3:54 PM.   Patient ID: Cassidy Tran, female    DOB: 07-Oct-1975, 40 y.o.   MRN: [id number]  Chief Complaint  Patient presents with  . Follow-up    hospital recently, pneumonia, not getting any better   HPI HPI Comments: Cassidy Tran is a 40 y.o. female, with a h/o Von Willebrand disease, asthma, who presents to the [facility] complaining of shortness of breath for several days. Pt states that she had the flu 2 weeks ago and was seen in the ED on 04/24/15 and diagnosed with PNA. She was discharged with a z-pak, tussionex, albuterol inhaler and prednisone which she finished yesterday. Pt states that she has been complaint with all medications. Today, pt reports associated cough, wheezing, chest pain only with coughing. She denies fever. Pt is a former smoker; quit approximately 3 weeks ago.   Past Medical History  Diagnosis Date  . Von Willebrand disease ([place])   . Asthma   . Smoker   . Cervical dysplasia, mild 08/2010    LGSIL colposcopy biopsy showing koilocytotic atypia  . Seizures ([place])     seizure disorder  . Anxiety   . Arthritis     lower back  . Headache(784.0)   . ASCUS (atypical squamous cells of undetermined significance) on Pap smear 03/2011    NEG HR HPV  . Endometriosis   . High risk HPV infection 12/2011    Pap normal  . Allergy   . Clotting disorder ([place])   . Depression    Current Outpatient Prescriptions on File Prior to Visit  Medication Sig Dispense Refill  . acetaminophen (TYLENOL) 500 MG tablet Take 1,000 mg by mouth every 6 (six) hours as needed. pain    . ADVAIR HFA 115-21 MCG/ACT inhaler INHALE 2 PUFFS BY MOUTH EVERY 12 HOURS TO PREVENT COUGH OR WHEEZE 1 Inhaler 5  . albuterol (PROVENTIL  HFA;VENTOLIN HFA) 108 (90 Base) MCG/ACT inhaler Inhale 1-2 puffs into the lungs every 6 (six) hours as needed for wheezing or shortness of breath. 1 Inhaler 0  . chlorpheniramine-HYDROcodone (TUSSIONEX PENNKINETIC ER) 10-8 MG/5ML SUER Take 5 mLs by mouth every 12 (twelve) hours as needed for cough. 140 mL 0  . divalproex (DEPAKOTE) 500 MG DR tablet Take 1,000 mg by mouth 2 (two) times daily.     [name] zonisamide (ZONEGRAN) 100 MG capsule Take 100 mg by mouth at bedtime.      [name] azithromycin (ZITHROMAX Z-PAK) 250 MG tablet One tab daily starting Sunday afternoon (Patient not taking: Reported on 04/30/2015) 4 each 0  . predniSONE (DELTASONE) 50 MG tablet 1 tablet daily for 6 days (Patient not taking: Reported on 04/30/2015) 6 tablet 0   Current Facility-Administered Medications on File Prior to Visit  Medication Dose Route Frequency Provider Last Rate Last Dose  . desmopressin (DDAVP) injection 34.04 mcg  0.3 mcg/kg (Order-Specific) Intravenous Once [name], MD       Allergies  Allergen Reactions  . Aspirin Other (See Comments)    Childhood reaction  . Codeine Nausea And Vomiting  . Erythromycin Other (See Comments)    Childhood reaction  . Nsaids Nausea And Vomiting  . Penicillins Other (See Comments)    Has patient had a PCN reaction causing immediate rash, facial/tongue/throat swelling,  SOB or lightheadedness with hypotension: doesn't remember childhood Reaction  Has patient had a PCN reaction causing severe rash involving mucus membranes or skin necrosis: NO Has patient had a PCN reaction that required hospitalization NO Has patient had a PCN reaction occurring within the last 10 years: NO If all of the above answers are "NO", then may proceed with Cephalosporin use.    Review of Systems  Constitutional: Negative for fever.  Respiratory: Positive for cough, shortness of breath and wheezing.   Cardiovascular: Positive for chest pain (only with coughing).      Objective:    Physical Exam  Constitutional: She is oriented to person, place, and time. She appears well-developed and well-nourished. No distress.  HENT:  Head: Normocephalic and atraumatic.  Nose: Nose normal.  Mouth/Throat: Posterior oropharyngeal erythema (with punctate lesions on soft palate.) present.  Eyes: Conjunctivae and EOM are normal.  Neck: Neck supple.  No cervical nodes.   Cardiovascular: Normal rate.   Pulmonary/Chest: She has wheezes (on inspiration and expiration bilaterally). She has rales (in the R axilla).  Musculoskeletal: Normal range of motion.  Neurological: She is alert and oriented to person, place, and time.  Skin: Skin is warm and dry.  Psychiatric: She has a normal mood and affect. Her behavior is normal.  Nursing note and vitals reviewed. koh neg  Neb improved sxt and decr wheezing    Assessment & Plan:   I have completed the patient encounter in its entirety as documented by the scribe, with editing by me where necessary. Ewell Benassi P. Laney Pastor, M.D.  Sore throat -   CAP (community acquired pneumonia)  Asthma with acute exacerbation, moderate persistent  Meds ordered this encounter  Medications  . albuterol (PROVENTIL) (2.5 MG/3ML) 0.083% nebulizer solution 2.5 mg    Sig:   . predniSONE (DELTASONE) 20 MG tablet    Sig: 4/4/3/3/2/2/1/1 single daily dose for 8 days    Dispense:  20 tablet    Refill:  0  . beclomethasone (QVAR) 40 MCG/ACT inhaler    Sig: Inhale 2 puffs into the lungs 2 (two) times daily. For 1-2 months    Dispense:  1 Inhaler    Refill:  2  . azithromycin (ZITHROMAX) 500 MG tablet    Sig: Take 1 tablet (500 mg total) by mouth daily.    Dispense:  5 tablet    Refill:  0  . HYDROcodone-homatropine (HYCODAN) 5-1.5 MG/5ML syrup    Sig: Take 5 mLs by mouth every 6 (six) hours as needed.    Dispense:  120 mL    Refill:  0

## 2015-05-28 ENCOUNTER — Other Ambulatory Visit: Payer: Self-pay

## 2015-05-28 MED ORDER — ALBUTEROL SULFATE HFA 108 (90 BASE) MCG/ACT IN AERS: 1.0000 | INHALATION_SPRAY | Freq: Four times a day (QID) | RESPIRATORY_TRACT | Status: DC | PRN

## 2015-06-20 ENCOUNTER — Other Ambulatory Visit: Payer: Self-pay | Admitting: Pediatrics

## 2015-06-21 NOTE — Telephone Encounter (Signed)
Patient needs office visit.  

## 2015-07-06 ENCOUNTER — Other Ambulatory Visit: Payer: Self-pay | Admitting: Allergy and Immunology

## 2015-07-30 ENCOUNTER — Encounter: Payer: BLUE CROSS/BLUE SHIELD | Admitting: Gynecology

## 2015-07-30 DIAGNOSIS — Z0289 Encounter for other administrative examinations: Secondary | ICD-10-CM

## 2015-08-23 ENCOUNTER — Other Ambulatory Visit: Payer: Self-pay | Admitting: Allergy and Immunology

## 2015-08-24 ENCOUNTER — Other Ambulatory Visit: Payer: Self-pay | Admitting: Allergy and Immunology

## 2015-08-25 ENCOUNTER — Other Ambulatory Visit: Payer: Self-pay

## 2015-08-25 ENCOUNTER — Telehealth: Payer: Self-pay

## 2015-08-25 MED ORDER — ALBUTEROL SULFATE HFA 108 (90 BASE) MCG/ACT IN AERS: INHALATION_SPRAY | RESPIRATORY_TRACT | Status: DC

## 2015-08-25 NOTE — Telephone Encounter (Signed)
SENT IN 1 REFILL

## 2015-08-25 NOTE — Telephone Encounter (Signed)
Patient Made a follow up to see Dr. Neldon Mc in August. She would like one refill until the appt for Pro Air to the [facility] in [place].   Please Advise

## 2015-09-07 ENCOUNTER — Other Ambulatory Visit: Payer: Self-pay | Admitting: Internal Medicine

## 2015-09-20 ENCOUNTER — Other Ambulatory Visit: Payer: Self-pay | Admitting: Allergy and Immunology

## 2015-09-21 NOTE — Telephone Encounter (Signed)
Please renew this medication. Please check an ultrasound and so that medication refills do not come to my in box.

## 2015-09-28 ENCOUNTER — Ambulatory Visit (INDEPENDENT_AMBULATORY_CARE_PROVIDER_SITE_OTHER): Payer: BLUE CROSS/BLUE SHIELD | Admitting: Allergy and Immunology

## 2015-09-28 ENCOUNTER — Encounter: Payer: Self-pay | Admitting: Allergy and Immunology

## 2015-09-28 VITALS — BP 138/82 | HR 72 | Resp 16 | Ht 63.0 in | Wt 182.0 lb

## 2015-09-28 DIAGNOSIS — J3489 Other specified disorders of nose and nasal sinuses: Secondary | ICD-10-CM

## 2015-09-28 DIAGNOSIS — H1010 Acute atopic conjunctivitis, unspecified eye: Secondary | ICD-10-CM

## 2015-09-28 DIAGNOSIS — J309 Allergic rhinitis, unspecified: Secondary | ICD-10-CM

## 2015-09-28 DIAGNOSIS — J4540 Moderate persistent asthma, uncomplicated: Secondary | ICD-10-CM | POA: Diagnosis not present

## 2015-09-28 MED ORDER — MOMETASONE FUROATE 50 MCG/ACT NA SUSP: NASAL | 5 refills | Status: AC

## 2015-09-28 NOTE — Patient Instructions (Addendum)
  1. Advair 115 2 inhalations twice a day  2. Nasonex 1-2 sprays each nostril 3-7 times per week  3. ProAir HFA 2 puffs every 4-6 hours if needed  4. Continue to work towards consolidating smoke exposure  5. Return to clinic in 1 year or earlier if problem

## 2015-09-28 NOTE — Progress Notes (Signed)
Follow-up Note  Referring Provider: Lavone Orn, MD Primary Provider: Irven Shelling, MD Date of Office Visit: 09/28/2015  Subjective:   Cassidy Tran (DOB: Dec 25, 1975) is a 40 y.o. female who returns to the [facility] on 09/28/2015 in re-evaluation of the following:  HPI: Calan returns to this clinic in reevaluation of her asthma and allergic rhinoconjunctivitis. She has not been seen in this clinic in over a year.  Her asthma has been under very good control and she has not required a systemic steroids to treat an exacerbation. She continues to use Advair consistently. She uses her bronchodilator about 3 times a week. She continues to smoke presently at 4 cigarettes per day. Apparently she had a pneumonia in February 2017 that was successfully treated with antibiotics.  Her nose has not been causing her much of a problem as long she continues to use a nasal steroid and occasionally an antihistamine. She has not required an antibiotic for an episode of sinusitis.  She refuses to get the flu vaccine.    Medication List      acetaminophen 500 MG tablet Commonly known as:  TYLENOL Take 1,000 mg by mouth every 6 (six) hours as needed. pain   ADVAIR HFA 115-21 MCG/ACT inhaler Generic drug:  fluticasone-salmeterol INHALE 2 PUFFS BY MOUTH EVERY 12 HOURS TO PREVENT COUGH OR WHEEZE   divalproex 500 MG DR tablet Commonly known as:  DEPAKOTE Take 1,000 mg by mouth 2 (two) times daily.   PROAIR HFA 108 (90 Base) MCG/ACT inhaler Generic drug:  albuterol INHALE 2 PUFFS BY MOUTH EVERY 6 HOURS AS NEEDED FOR WHEEZING OF SHORTNESS OF BREATH   QVAR 40 MCG/ACT inhaler Generic drug:  beclomethasone INHALE 2 PUFFS INTO THE LUNGS TWICE DAILY FOR 1 TO 2 MONTHS   zonisamide 100 MG capsule Commonly known as:  ZONEGRAN Take 100 mg by mouth at bedtime.       Past Medical History:  Diagnosis Date  . Allergy   . Anxiety   . Arthritis    lower back  .  ASCUS (atypical squamous cells of undetermined significance) on Pap smear 03/2011   NEG HR HPV  . Asthma   . Cervical dysplasia, mild 08/2010   LGSIL colposcopy biopsy showing koilocytotic atypia  . Clotting disorder ([place])   . Depression   . Endometriosis   . Headache(784.0)   . High risk HPV infection 12/2011   Pap normal  . Seizures ([facility])    seizure disorder  . Smoker   . Von Willebrand disease ([place])     Past Surgical History:  Procedure Laterality Date  . COLPOSCOPY    . CYSTOSCOPY N/A 10/14/2012   Procedure: CYSTOSCOPY;  Surgeon: [name], MD;  Location: [facility];  Service: Gynecology;  Laterality: N/A;  . DILATION AND CURETTAGE OF UTERUS    . HYSTEROSCOPY W/D&C  10/14/2010   Procedure: DILATATION AND CURETTAGE (D&C) /HYSTEROSCOPY;  Surgeon: [name], MD;  Location: [facility];  Service: Gynecology;  Laterality: N/A;  . LAPAROSCOPIC HYSTERECTOMY N/A 10/14/2012   Procedure: HYSTERECTOMY TOTAL LAPAROSCOPIC;  Surgeon: [name], MD;  Location: [facility];  Service: Gynecology;  Laterality: N/A;  CPT [id number]  2 1/2 hours  Dr. Uvaldo Rising to assist.  . LAPAROSCOPY  04/27/2011   Procedure: LAPAROSCOPY OPERATIVE;  Surgeon: [name], MD;  Location: [facility];  Service: Gynecology;  Laterality: N/A;  removal right cyst , lysis of adhesions, biopsy of peritoneum  . NASAL  SINUS SURGERY  1993   IN [place], [place]  . TONSILLECTOMY    . VAGINAL HYSTERECTOMY    . WISDOM TOOTH EXTRACTION  1993    Allergies  Allergen Reactions  . Aspirin Other (See Comments)    Childhood reaction  . Codeine Nausea And Vomiting  . Erythromycin Other (See Comments)    Childhood reaction  . Nsaids Nausea And Vomiting  . Penicillins Other (See Comments)    Has patient had a PCN reaction causing immediate rash, facial/tongue/throat swelling, SOB or lightheadedness with hypotension: doesn't remember childhood Reaction  Has patient had a PCN reaction causing severe rash involving mucus  membranes or skin necrosis: NO Has patient had a PCN reaction that required hospitalization NO Has patient had a PCN reaction occurring within the last 10 years: NO If all of the above answers are "NO", then may proceed with Cephalosporin use.     Review of systems negative except as noted in HPI / PMHx or noted below:  Review of Systems  Constitutional: Negative.   HENT: Negative.   Eyes: Negative.   Respiratory: Negative.   Cardiovascular: Negative.   Gastrointestinal: Negative.   Genitourinary: Negative.   Musculoskeletal: Negative.   Skin: Negative.   Neurological: Negative.   Endo/Heme/Allergies: Negative.   Psychiatric/Behavioral: Negative.      Objective:   Vitals:   09/28/15 1748  BP: 138/82  Pulse: 72  Resp: 16   Height: 5\' 3"  (160 cm)  Weight: 182 lb (82.6 kg)   Physical Exam  Constitutional: She is well-developed, well-nourished, and in no distress.  HENT:  Head: Normocephalic.  Right Ear: Tympanic membrane, external ear and ear canal normal.  Left Ear: Tympanic membrane, external ear and ear canal normal.  Nose: Nose normal. No mucosal edema or rhinorrhea.  Mouth/Throat: Uvula is midline, oropharynx is clear and moist and mucous membranes are normal. No oropharyngeal exudate.  Eyes: Conjunctivae are normal.  Neck: Trachea normal. No tracheal tenderness present. No tracheal deviation present. No thyromegaly present.  Cardiovascular: Normal rate, regular rhythm, S1 normal, S2 normal and normal heart sounds.   No murmur heard. Pulmonary/Chest: Breath sounds normal. No stridor. No respiratory distress. She has no wheezes. She has no rales.  Musculoskeletal: She exhibits no edema.  Lymphadenopathy:       Head (right side): No tonsillar adenopathy present.       Head (left side): No tonsillar adenopathy present.    She has no cervical adenopathy.  Neurological: She is alert. Gait normal.  Skin: No rash noted. She is not diaphoretic. No erythema. Nails show  no clubbing.  Psychiatric: Mood and affect normal.    Diagnostics:    Spirometry was performed and demonstrated an FEV1 of 1.53 at 56 % of predicted.  The patient had an Asthma Control Test with the following results:  .    Assessment and Plan:   1. Asthma, moderate persistent, well-controlled   2. Allergic rhinoconjunctivitis   3. Nasal septal perforation     1. Advair 115 2 inhalations twice a day  2. Nasonex 1-2 sprays each nostril 3-7 times per week  3. ProAir HFA 2 puffs every 4-6 hours if needed  4. Continue to work towards consolidating smoke exposure  5. Return to clinic in 1 year or earlier if problem  I have refilled Delsie's medications and I encouraged her to continue to use anti-inflammatory agents for respiratory tract and to continue to minimize exposure to smoke. I suspect that if she is going to  be able to eliminate all exposure to smoke her respiratory status will improved tremendously and she may not need to use many medications. I will see her back in this clinic in 1 year or earlier if there is a problem.  Allena Katz, MD [place]

## 2015-10-05 ENCOUNTER — Encounter: Payer: Self-pay | Admitting: *Deleted

## 2015-10-18 ENCOUNTER — Other Ambulatory Visit: Payer: Self-pay | Admitting: Allergy and Immunology

## 2015-10-31 ENCOUNTER — Other Ambulatory Visit: Payer: Self-pay | Admitting: Urgent Care

## 2015-11-08 ENCOUNTER — Other Ambulatory Visit: Payer: Self-pay | Admitting: Allergy and Immunology

## 2015-11-12 ENCOUNTER — Other Ambulatory Visit: Payer: Self-pay

## 2015-11-12 MED ORDER — ALBUTEROL SULFATE HFA 108 (90 BASE) MCG/ACT IN AERS: INHALATION_SPRAY | RESPIRATORY_TRACT | 2 refills | Status: DC

## 2015-11-12 NOTE — Telephone Encounter (Signed)
Refill on Proair HFA at [facility]. 1, with 2 refills

## 2015-12-04 ENCOUNTER — Other Ambulatory Visit: Payer: Self-pay | Admitting: Pediatrics

## 2015-12-23 ENCOUNTER — Encounter: Payer: Self-pay | Admitting: Gynecology

## 2015-12-23 ENCOUNTER — Ambulatory Visit (INDEPENDENT_AMBULATORY_CARE_PROVIDER_SITE_OTHER): Payer: BLUE CROSS/BLUE SHIELD | Admitting: Gynecology

## 2015-12-23 VITALS — BP 134/80

## 2015-12-23 DIAGNOSIS — R102 Pelvic and perineal pain: Secondary | ICD-10-CM

## 2015-12-23 NOTE — Progress Notes (Signed)
    Cassidy Tran 1975/05/08 [id number]        40 y.o.  G0P0 presents with the history of acute onset of pelvic pain 2 weeks ago. Was driving in a car and had sharp stabbing mid pelvic pain that lasted for several days that resolved spontaneously. No nausea vomiting diarrhea constipation. No dysuria frequency urgency low back pain. No fever or chills. History of TLH in the past with endometriosis.  Past medical history,surgical history, problem list, medications, allergies, family history and social history were all reviewed and documented in the EPIC chart.  Directed ROS with pertinent positives and negatives documented in the history of present illness/assessment and plan.  Exam: Cassidy Tran assistant Vitals:   12/23/15 0830  BP: 134/80   General appearance:  Normal Spine straight without CVA tenderness. Abdomen soft nontender without masses guarding rebound Pelvic external BUS vagina normal. Bimanual without masses or tenderness. Rectal exam normal  Assessment/Plan:  40 y.o. G0P0 with acute pelvic pain now resolved. No associated symptoms. Does have past history of endometriosis. Reviewed differential to include GYN versus non-GYN. Probable ovarian cyst with rupture. Options to ultrasound now versus observation given normal exam and resolution of her pain discussed. Patient's comfortable with observation. Will call if she has recurrence of the pain we'll pursue ultrasound at that time. Does have annual exam scheduled next week and will follow up for this.    Cassidy Auerbach MD, 8:45 AM 12/23/2015

## 2015-12-23 NOTE — Patient Instructions (Signed)
Follow up for annual exam next week as scheduled

## 2015-12-25 LAB — URINALYSIS W MICROSCOPIC + REFLEX CULTURE
BILIRUBIN URINE: NEGATIVE
Casts: NONE SEEN [LPF]
Glucose, UA: NEGATIVE
HGB URINE DIPSTICK: NEGATIVE
KETONES UR: NEGATIVE
Leukocytes, UA: NEGATIVE
Nitrite: NEGATIVE
PROTEIN: NEGATIVE
Specific Gravity, Urine: 1.025 (ref 1.001–1.035)
Yeast: NONE SEEN [HPF]
pH: 6.5 (ref 5.0–8.0)

## 2015-12-30 ENCOUNTER — Ambulatory Visit (INDEPENDENT_AMBULATORY_CARE_PROVIDER_SITE_OTHER): Payer: BLUE CROSS/BLUE SHIELD | Admitting: Gynecology

## 2015-12-30 ENCOUNTER — Encounter: Payer: Self-pay | Admitting: Gynecology

## 2015-12-30 VITALS — BP 132/80 | Ht 63.0 in | Wt 189.0 lb

## 2015-12-30 DIAGNOSIS — Z13220 Encounter for screening for lipoid disorders: Secondary | ICD-10-CM | POA: Diagnosis not present

## 2015-12-30 DIAGNOSIS — Z01419 Encounter for gynecological examination (general) (routine) without abnormal findings: Secondary | ICD-10-CM

## 2015-12-30 LAB — CBC WITH DIFFERENTIAL/PLATELET
Basophils Absolute: 0 cells/uL (ref 0–200)
Basophils Relative: 0 %
EOS ABS: 82 {cells}/uL (ref 15–500)
Eosinophils Relative: 1 %
HEMATOCRIT: 40.5 % (ref 35.0–45.0)
Hemoglobin: 13.5 g/dL (ref 11.7–15.5)
LYMPHS PCT: 35 %
Lymphs Abs: 2870 cells/uL (ref 850–3900)
MCH: 30.3 pg (ref 27.0–33.0)
MCHC: 33.3 g/dL (ref 32.0–36.0)
MCV: 91 fL (ref 80.0–100.0)
MONO ABS: 410 {cells}/uL (ref 200–950)
MONOS PCT: 5 %
MPV: 9.6 fL (ref 7.5–12.5)
NEUTROS PCT: 59 %
Neutro Abs: 4838 cells/uL (ref 1500–7800)
PLATELETS: 395 10*3/uL (ref 140–400)
RBC: 4.45 MIL/uL (ref 3.80–5.10)
RDW: 13.7 % (ref 11.0–15.0)
WBC: 8.2 10*3/uL (ref 3.8–10.8)

## 2015-12-30 LAB — LIPID PANEL
CHOL/HDL RATIO: 6 ratio — AB (ref <=5.0)
Cholesterol: 216 mg/dL — ABNORMAL HIGH (ref 125–200)
HDL: 36 mg/dL — AB (ref >=46)
LDL CALC: 153 mg/dL — AB (ref <130)
Triglycerides: 133 mg/dL (ref <150)
VLDL: 27 mg/dL (ref <30)

## 2015-12-30 LAB — COMPREHENSIVE METABOLIC PANEL
ALT: 13 U/L (ref 6–29)
AST: 13 U/L (ref 10–30)
Albumin: 4.1 g/dL (ref 3.6–5.1)
Alkaline Phosphatase: 86 U/L (ref 33–115)
BUN: 12 mg/dL (ref 7–25)
CALCIUM: 9 mg/dL (ref 8.6–10.2)
CHLORIDE: 106 mmol/L (ref 98–110)
CO2: 22 mmol/L (ref 20–31)
Creat: 0.77 mg/dL (ref 0.50–1.10)
GLUCOSE: 122 mg/dL — AB (ref 65–99)
Potassium: 3.7 mmol/L (ref 3.5–5.3)
Sodium: 139 mmol/L (ref 135–146)
Total Bilirubin: 0.8 mg/dL (ref 0.2–1.2)
Total Protein: 6.8 g/dL (ref 6.1–8.1)

## 2015-12-30 NOTE — Patient Instructions (Signed)

## 2015-12-30 NOTE — Progress Notes (Signed)
    Cassidy Tran 06/08/1975 [id number]        40 y.o.  G0P0  for annual exam.  Doing well. Recent episode of pelvic pain which has now resolved.  Past medical history,surgical history, problem list, medications, allergies, family history and social history were all reviewed and documented as reviewed in the EPIC chart.  ROS:  Performed with pertinent positives and negatives included in the history, assessment and plan.   Additional significant findings :  None   Exam: [name] assistant Vitals:   12/30/15 0754  BP: 132/80  Weight: 189 lb (85.7 kg)  Height: 5\' 3"  (1.6 m)   Body mass index is 33.48 kg/m.  General appearance:  Normal affect, orientation and appearance. Skin: Grossly normal HEENT: Without gross lesions.  No cervical or supraclavicular adenopathy. Thyroid normal.  Lungs:  Clear without wheezing, rales or rhonchi Cardiac: RR, without RMG Abdominal:  Soft, nontender, without masses, guarding, rebound, organomegaly or hernia Breasts:  Examined lying and sitting without masses, retractions, discharge or axillary adenopathy. Pelvic:  Ext, BUS, Vagina normal  Adnexa without masses or tenderness    Anus and perineum normal   Rectovaginal normal sphincter tone without palpated masses or tenderness.    Assessment/Plan:  40 y.o. G0P0 female for annual exam status post TLH in the past for endometriosis.[name]   1. Pap smear 2016. No Pap smear done today. History of LGSIL 2012. ASCUS with positive high-risk HPV 2013. Normal Pap smear with negative HPV 2014. 2. Mammography 2014. Was to have repeat at 6 month interval due to calcifications. Never followed up. Strongly recommended patient follow up for mammogram now and she agrees to do so. SBE monthly reviewed. 3. Continues to smoke despite recommendations to stop. 4. Health maintenance. Baseline CBC, CMP, lipid profile, urinalysis ordered. Follow up in one year, sooner as needed.   [name] MD, 8:12 AM  12/30/2015

## 2016-01-06 ENCOUNTER — Other Ambulatory Visit: Payer: Self-pay | Admitting: Gynecology

## 2016-01-06 DIAGNOSIS — R8271 Bacteriuria: Secondary | ICD-10-CM

## 2016-01-11 ENCOUNTER — Other Ambulatory Visit: Payer: Self-pay | Admitting: Allergy

## 2016-01-11 MED ORDER — ALBUTEROL SULFATE HFA 108 (90 BASE) MCG/ACT IN AERS: INHALATION_SPRAY | RESPIRATORY_TRACT | 2 refills | Status: DC

## 2016-01-14 ENCOUNTER — Other Ambulatory Visit: Payer: Self-pay | Admitting: Gynecology

## 2016-01-14 DIAGNOSIS — R921 Mammographic calcification found on diagnostic imaging of breast: Secondary | ICD-10-CM

## 2016-02-07 ENCOUNTER — Ambulatory Visit
Admission: RE | Admit: 2016-02-07 | Discharge: 2016-02-07 | Disposition: A | Discharge status: Home / Self Care | Payer: BLUE CROSS/BLUE SHIELD | Source: Ambulatory Visit | Attending: Gynecology | Admitting: Gynecology

## 2016-02-07 DIAGNOSIS — R921 Mammographic calcification found on diagnostic imaging of breast: Secondary | ICD-10-CM

## 2016-02-10 ENCOUNTER — Ambulatory Visit (INDEPENDENT_AMBULATORY_CARE_PROVIDER_SITE_OTHER): Payer: BLUE CROSS/BLUE SHIELD | Admitting: Medical

## 2016-02-10 ENCOUNTER — Encounter: Payer: Self-pay | Admitting: Medical

## 2016-02-10 VITALS — BP 112/70 | HR 96 | Temp 97.9°F | Wt 188.8 lb

## 2016-02-10 DIAGNOSIS — R05 Cough: Secondary | ICD-10-CM

## 2016-02-10 DIAGNOSIS — R062 Wheezing: Secondary | ICD-10-CM | POA: Diagnosis not present

## 2016-02-10 DIAGNOSIS — F17200 Nicotine dependence, unspecified, uncomplicated: Secondary | ICD-10-CM | POA: Diagnosis not present

## 2016-02-10 DIAGNOSIS — J4541 Moderate persistent asthma with (acute) exacerbation: Secondary | ICD-10-CM | POA: Diagnosis not present

## 2016-02-10 DIAGNOSIS — R059 Cough, unspecified: Secondary | ICD-10-CM

## 2016-02-10 MED ORDER — PREDNISONE 10 MG PO TABS: ORAL_TABLET | ORAL | 0 refills | Status: DC

## 2016-02-10 MED ORDER — AZITHROMYCIN 250 MG PO TABS: ORAL_TABLET | ORAL | 0 refills | Status: DC

## 2016-02-10 MED ORDER — HYDROCODONE-HOMATROPINE 5-1.5 MG/5ML PO SYRP: 5.0000 mL | ORAL_SOLUTION | Freq: Three times a day (TID) | ORAL | 0 refills | Status: DC | PRN

## 2016-02-10 NOTE — Progress Notes (Signed)
Subjective:  Cassidy Tran is a 40 y.o. female who presents for illness.   New patient today.  Was primarily seeing urgent care.  "feels like crap" x 2 weeks.  Started as a cold, but still feels bad.  Has hoarse voice x 1 week, lots of cough.  Has cerebellar tonsillar ectopia, and gets bad headache when she coughs.  Non productive cough.  Tickle in throat.  Has a lot of nausea, but no vomiting.   Always wheezing and SOB the past 2 weeks.  Cough is keeping her and her husband up.  Using robitussin OTC.   Using albuterol once a day currently.  Taking Advair BID.   Doesn't have home nebulizer.   Smokes 1/2 ppd.   Has been smoking close to 30 years.    Has insomnia issues in general.  Works Therapist, art for [employer], [employer], [employer].   Was seeing Dr. Stefano Gaul, now [facility]  Last PFT 09/2015.  Uses Advair year round.   No other aggravating or relieving factors.  No other c/o.  The following portions of the patient's history were reviewed and updated as appropriate: allergies, current medications, past family history, past medical history, past social history, past surgical history and problem list.  ROS as in subjective  Past Medical History:  Diagnosis Date  . Allergy   . Anxiety   . Arthritis    lower back  . ASCUS (atypical squamous cells of undetermined significance) on Pap smear 03/2011   NEG HR HPV  . Asthma   . Cervical dysplasia, mild 08/2010   LGSIL colposcopy biopsy showing koilocytotic atypia  . Clotting disorder ([place])   . Depression   . Endometriosis   . Headache(784.0)   . High risk HPV infection 12/2011   Pap normal  . Seizures ([facility])    seizure disorder  . Smoker   . Von Willebrand disease ([place])    Current Outpatient Prescriptions on File Prior to Visit  Medication Sig Dispense Refill  . acetaminophen (TYLENOL) 500 MG tablet Take 1,000 mg by mouth every 6 (six) hours as needed. pain    . ADVAIR HFA 115-21 MCG/ACT  inhaler INHALE 2 PUFFS BY MOUTH EVERY 12 HOURS TO PREVENT COUGH OR WHEEZE 12 g 4  . albuterol (PROAIR HFA) 108 (90 Base) MCG/ACT inhaler INHALE 2 PUFFS BY MOUTH EVERY 6 HOURS AS NEEDED FOR WHEEZING OF SHORTNESS OF BREATH 1 Inhaler 2  . divalproex (DEPAKOTE) 500 MG DR tablet Take 1,000 mg by mouth 2 (two) times daily.     . mometasone (NASONEX) 50 MCG/ACT nasal spray Use one to two sprays in each nostril once daily 17 g 5  . zonisamide (ZONEGRAN) 100 MG capsule Take 100 mg by mouth at bedtime.       Current Facility-Administered Medications on File Prior to Visit  Medication Dose Route Frequency Provider Last Rate Last Dose  . desmopressin (DDAVP) injection 34.04 mcg  0.3 mcg/kg (Order-Specific) Intravenous Once [name], MD       ROS as in subjective   Objective: BP 112/70   Pulse 96   Temp 97.9 F (36.6 C)   Wt 188 lb 12.8 oz (85.6 kg)   LMP 09/16/2012   SpO2 96%   BMI 33.44 kg/m   General appearance: Alert, WD/WN, no distress, hoarse voice, white female  Skin: warm, no rash, no diaphoresis                           Head: no sinus tenderness                            Eyes: conjunctiva normal, corneas clear, PERRLA                            Ears: pearly TMs, external ear canals normal                          Nose: septum midline, turbinates swollen, with erythema and clear discharge             Mouth/throat: MMM, tongue normal, mild pharyngeal erythema                           Neck: supple, no adenopathy, no thyromegaly, nontender                          Heart: RRR, normal S1, S2, no murmurs                         Lungs: +bronchial breath sounds, +scattered rhonchi, +faint wheezes, no rales                Extremities: no edema, nontender      Assessment: Encounter Diagnoses  Name Primary?  . Moderate persistent asthmatic bronchitis with acute exacerbation Yes  . Cough   . Wheezing   . Smoker      Plan:  Begin zpak, prednisone,  hycodan.  Discussed her allergies, and she tolerates these Medicaine, discussed risks/benefits of medications.   C/t Advair as usual, increase albuterol q 4-6 hours for the next week while dealing wit this illness. Rest, hydrate well.    Strongly encouraged her to quit tobacco.  She will consider.  Her and husband both smoke.    Call/return in 2-3 days if symptoms are worse or not improving.  Advised that cough may linger even after the infection is improved.

## 2016-02-28 HISTORY — PX: BREAST BIOPSY: SHX20

## 2016-03-02 ENCOUNTER — Other Ambulatory Visit: Payer: Self-pay | Admitting: Gynecology

## 2016-03-02 ENCOUNTER — Ambulatory Visit: Admission: RE | Admit: 2016-03-02 | Payer: BLUE CROSS/BLUE SHIELD | Source: Ambulatory Visit

## 2016-03-02 ENCOUNTER — Ambulatory Visit
Admission: RE | Admit: 2016-03-02 | Discharge: 2016-03-02 | Disposition: A | Discharge status: Home / Self Care | Payer: BLUE CROSS/BLUE SHIELD | Source: Ambulatory Visit | Attending: Gynecology | Admitting: Gynecology

## 2016-03-02 DIAGNOSIS — R921 Mammographic calcification found on diagnostic imaging of breast: Secondary | ICD-10-CM

## 2016-03-06 ENCOUNTER — Ambulatory Visit
Admission: RE | Admit: 2016-03-06 | Discharge: 2016-03-06 | Disposition: A | Discharge status: Home / Self Care | Payer: BLUE CROSS/BLUE SHIELD | Source: Ambulatory Visit | Attending: Gynecology | Admitting: Gynecology

## 2016-03-06 ENCOUNTER — Other Ambulatory Visit: Payer: Self-pay | Admitting: Gynecology

## 2016-03-06 DIAGNOSIS — R921 Mammographic calcification found on diagnostic imaging of breast: Secondary | ICD-10-CM

## 2016-03-10 ENCOUNTER — Other Ambulatory Visit: Payer: Self-pay

## 2016-03-10 MED ORDER — ALBUTEROL SULFATE HFA 108 (90 BASE) MCG/ACT IN AERS: INHALATION_SPRAY | RESPIRATORY_TRACT | 1 refills | Status: DC

## 2016-03-14 ENCOUNTER — Other Ambulatory Visit: Payer: Self-pay | Admitting: Family Medicine

## 2016-03-15 ENCOUNTER — Other Ambulatory Visit: Payer: Self-pay | Admitting: Family Medicine

## 2016-03-16 ENCOUNTER — Other Ambulatory Visit: Payer: Self-pay | Admitting: Neurosurgery

## 2016-03-16 ENCOUNTER — Other Ambulatory Visit: Payer: Self-pay | Admitting: Orthopaedic Surgery

## 2016-03-16 DIAGNOSIS — Q048 Other specified congenital malformations of brain: Secondary | ICD-10-CM

## 2016-03-16 DIAGNOSIS — M542 Cervicalgia: Secondary | ICD-10-CM

## 2016-03-23 ENCOUNTER — Inpatient Hospital Stay: Admission: RE | Admit: 2016-03-23 | Payer: BLUE CROSS/BLUE SHIELD | Source: Ambulatory Visit

## 2016-03-23 ENCOUNTER — Inpatient Hospital Stay
Admission: RE | Admit: 2016-03-23 | Discharge: 2016-03-23 | Disposition: A | Discharge status: Home / Self Care | Payer: BLUE CROSS/BLUE SHIELD | Source: Ambulatory Visit | Attending: Orthopaedic Surgery | Admitting: Orthopaedic Surgery

## 2016-03-24 ENCOUNTER — Ambulatory Visit
Admission: RE | Admit: 2016-03-24 | Discharge: 2016-03-24 | Disposition: A | Discharge status: Home / Self Care | Payer: BLUE CROSS/BLUE SHIELD | Source: Ambulatory Visit | Attending: Neurosurgery | Admitting: Neurosurgery

## 2016-03-24 DIAGNOSIS — M542 Cervicalgia: Secondary | ICD-10-CM

## 2016-03-24 DIAGNOSIS — Q048 Other specified congenital malformations of brain: Secondary | ICD-10-CM

## 2016-04-15 ENCOUNTER — Other Ambulatory Visit: Payer: Self-pay | Admitting: Pediatrics

## 2016-04-26 ENCOUNTER — Other Ambulatory Visit: Payer: Self-pay | Admitting: Allergy

## 2016-04-26 MED ORDER — ALBUTEROL SULFATE HFA 108 (90 BASE) MCG/ACT IN AERS: INHALATION_SPRAY | RESPIRATORY_TRACT | 1 refills | Status: DC

## 2016-05-02 ENCOUNTER — Ambulatory Visit (INDEPENDENT_AMBULATORY_CARE_PROVIDER_SITE_OTHER): Payer: BLUE CROSS/BLUE SHIELD | Admitting: Family Medicine

## 2016-05-02 ENCOUNTER — Encounter: Payer: Self-pay | Admitting: Family Medicine

## 2016-05-02 VITALS — BP 150/90 | HR 114 | Wt 198.0 lb

## 2016-05-02 DIAGNOSIS — R519 Headache, unspecified: Secondary | ICD-10-CM

## 2016-05-02 DIAGNOSIS — R51 Headache: Secondary | ICD-10-CM

## 2016-05-02 MED ORDER — TRAMADOL HCL 50 MG PO TABS: 50.0000 mg | ORAL_TABLET | Freq: Three times a day (TID) | ORAL | 0 refills | Status: DC | PRN

## 2016-05-02 NOTE — Addendum Note (Signed)
Addended by: Denita Lung on: 05/02/2016 04:58 PM   Modules accepted: Orders

## 2016-05-02 NOTE — Progress Notes (Addendum)
   Subjective:    Patient ID: Cassidy Tran, female    DOB: 1975-12-21, 41 y.o.   MRN: [id number]  HPI She is here for rotation concerning an occipital type headache. She apparently is seeing a neurosurgeon at [facility] and has a condition very similar to Arnold-Chiari malformation. She complains of intermittent stabbing sharp occipital headache. She was given a prescription of butalbital/Tylenol and states it's not doing any good. Her doctor is Dr. Bridgette Habermann. She apparently called him today but is not heard back. She apparently does have a known allergies to various NSAIDs. She was told when she was quite young she had allergies to this.   Review of Systems     Objective:   Physical Exam Alert and in no distress otherwise not examined       Assessment & Plan:  Nonintractable headache, unspecified chronicity pattern, unspecified headache type I explained that I was not comfortable giving her pain medication without discussing this further with the neurosurgeon. A call was made and I am waiting for the return phone call. I did receive a phone call back from Dr. Margot Chimes. I will call in tramadol and recommended if this does not work she would need to set up an appointment with him.

## 2016-06-19 ENCOUNTER — Other Ambulatory Visit: Payer: Self-pay | Admitting: Allergy

## 2016-06-19 MED ORDER — ALBUTEROL SULFATE HFA 108 (90 BASE) MCG/ACT IN AERS: INHALATION_SPRAY | RESPIRATORY_TRACT | 1 refills | Status: DC

## 2016-07-01 ENCOUNTER — Other Ambulatory Visit: Payer: Self-pay | Admitting: Family Medicine

## 2016-07-01 DIAGNOSIS — R519 Headache, unspecified: Secondary | ICD-10-CM

## 2016-07-01 DIAGNOSIS — R51 Headache: Principal | ICD-10-CM

## 2016-07-03 NOTE — Telephone Encounter (Signed)
Is this okay to refill? 

## 2016-07-03 NOTE — Telephone Encounter (Signed)
Okay to refill? 

## 2016-07-03 NOTE — Telephone Encounter (Signed)
I have called in tramadol 

## 2016-07-17 ENCOUNTER — Emergency Department (HOSPITAL_COMMUNITY): Payer: BLUE CROSS/BLUE SHIELD

## 2016-07-17 ENCOUNTER — Emergency Department (HOSPITAL_COMMUNITY)
Admission: EM | Admit: 2016-07-17 | Discharge: 2016-07-17 | Disposition: A | Discharge status: Home / Self Care | Payer: BLUE CROSS/BLUE SHIELD | Attending: Emergency Medicine | Admitting: Emergency Medicine

## 2016-07-17 ENCOUNTER — Encounter (HOSPITAL_COMMUNITY): Payer: Self-pay | Admitting: Emergency Medicine

## 2016-07-17 DIAGNOSIS — R42 Dizziness and giddiness: Secondary | ICD-10-CM

## 2016-07-17 DIAGNOSIS — H532 Diplopia: Secondary | ICD-10-CM | POA: Insufficient documentation

## 2016-07-17 DIAGNOSIS — R110 Nausea: Secondary | ICD-10-CM | POA: Diagnosis not present

## 2016-07-17 DIAGNOSIS — J45909 Unspecified asthma, uncomplicated: Secondary | ICD-10-CM | POA: Insufficient documentation

## 2016-07-17 DIAGNOSIS — F17210 Nicotine dependence, cigarettes, uncomplicated: Secondary | ICD-10-CM | POA: Insufficient documentation

## 2016-07-17 DIAGNOSIS — Z79899 Other long term (current) drug therapy: Secondary | ICD-10-CM | POA: Diagnosis not present

## 2016-07-17 LAB — I-STAT CHEM 8, ED
BUN: 5 mg/dL — AB (ref 6–20)
CALCIUM ION: 1.08 mmol/L — AB (ref 1.15–1.40)
CREATININE: 0.5 mg/dL (ref 0.44–1.00)
Chloride: 103 mmol/L (ref 101–111)
Glucose, Bld: 92 mg/dL (ref 65–99)
HEMATOCRIT: 43 % (ref 36.0–46.0)
HEMOGLOBIN: 14.6 g/dL (ref 12.0–15.0)
Potassium: 3.5 mmol/L (ref 3.5–5.1)
SODIUM: 140 mmol/L (ref 135–145)
TCO2: 26 mmol/L (ref 0–100)

## 2016-07-17 MED ORDER — LORAZEPAM 2 MG/ML IJ SOLN
1.0000 mg | Freq: Once | INTRAMUSCULAR | Status: AC
  Administered 2016-07-17: 1 mg via INTRAVENOUS
  Filled 2016-07-17: qty 1

## 2016-07-17 MED ORDER — MECLIZINE HCL 25 MG PO TABS: 25.0000 mg | ORAL_TABLET | Freq: Three times a day (TID) | ORAL | 0 refills | Status: DC | PRN

## 2016-07-17 NOTE — ED Notes (Signed)
Patient request to be stuck only one time for iv and blood work. RN aware.

## 2016-07-17 NOTE — ED Notes (Signed)
Pt has been given meal tray.

## 2016-07-17 NOTE — ED Provider Notes (Signed)
[facility] DEPT Provider Note   CSN: [id number] Arrival date & time: 07/17/16  1228     History   Chief Complaint Chief Complaint  Patient presents with  . Blurred Vision  . Headache  . Nausea    HPI Cassidy Tran is a 41 y.o. female.  HPI   Pt with hx cerebellar tonsillar ectopia, Chiari 1 malformation, mild small syrinx upper cervical spinal cord, seen by Dr Ellene Route (neurosurgery) and on gabapentin, planning for surgery in the fall p/w 1 week of nearly constant vertigo, diplopia, and nausea.  The pain she feels in the back of her head is unchanged and is constant.  There are no positional changes, no exacerbating or palliative factors.  She has had no medication changes, no recent illness.  She called Dr Clarice Pole office and was advised to come to the ED.    Past Medical History:  Diagnosis Date  . Allergy   . Anxiety   . Arthritis    lower back  . ASCUS (atypical squamous cells of undetermined significance) on Pap smear 03/2011   NEG HR HPV  . Asthma   . Cervical dysplasia, mild 08/2010   LGSIL colposcopy biopsy showing koilocytotic atypia  . Clotting disorder ([place])   . Depression   . Endometriosis   . Headache(784.0)   . High risk HPV infection 12/2011   Pap normal  . Seizures (HCC)    seizure disorder  . Smoker   . Von Willebrand disease [facility])     Patient Active Problem List   Diagnosis Date Noted  . Von Willebrand disease ([place]) 10/28/2010  . Asthmatic bronchitis 10/28/2010  . Seizure disorder ([place]) 10/28/2010  . Anxiety 10/28/2010  . Smoker 10/28/2010  . Arthritis 10/28/2010  . Headache(784.0) 10/28/2010  . Blood dyscrasia 10/28/2010    Past Surgical History:  Procedure Laterality Date  . COLPOSCOPY    . CYSTOSCOPY N/A 10/14/2012   Procedure: CYSTOSCOPY;  Surgeon: [name], MD;  Location: [facility];  Service: Gynecology;  Laterality: N/A;  . DILATION AND CURETTAGE OF UTERUS    . HYSTEROSCOPY W/D&C  10/14/2010   Procedure: DILATATION AND  CURETTAGE (D&C) /HYSTEROSCOPY;  Surgeon: [name], MD;  Location: [facility];  Service: Gynecology;  Laterality: N/A;  . LAPAROSCOPIC HYSTERECTOMY N/A 10/14/2012   Procedure: HYSTERECTOMY TOTAL LAPAROSCOPIC;  Surgeon: [name], MD;  Location: [facility];  Service: Gynecology;  Laterality: N/A;  CPT [id number]  2 1/2 hours  Dr. Uvaldo Rising to assist.  . LAPAROSCOPY  04/27/2011   Procedure: LAPAROSCOPY OPERATIVE;  Surgeon: [name], MD;  Location: [facility];  Service: Gynecology;  Laterality: N/A;  removal right cyst , lysis of adhesions, biopsy of peritoneum  . NASAL SINUS SURGERY  1993   IN [place], [place]  . TONSILLECTOMY    . VAGINAL HYSTERECTOMY    . WISDOM TOOTH EXTRACTION  1993    OB History    Gravida Para Term Preterm AB Living   0             SAB TAB Ectopic Multiple Live Births                   Home Medications    Prior to Admission medications   Medication Sig Start Date End Date Taking? Authorizing Provider  acetaminophen (TYLENOL) 500 MG tablet Take 1,000 mg by mouth every 6 (six) hours as needed. pain   Yes [provider]  ADVAIR HFA 115-21 MCG/ACT inhaler INHALE 2  PUFFS BY MOUTH EVERY 12 HOURS TO PREVENT COUGH OR WHEEZE 04/17/16  Yes Kozlow, Donnamarie Poag, MD  albuterol (PROAIR HFA) 108 (90 Base) MCG/ACT inhaler INHALE 2 PUFFS BY MOUTH EVERY 6 HOURS AS NEEDED FOR WHEEZING OF SHORTNESS OF BREATH 06/19/16  Yes Kozlow, Donnamarie Poag, MD  divalproex (DEPAKOTE ER) 500 MG 24 hr tablet Take 1,000 mg by mouth 2 (two) times daily.   Yes [provider]  gabapentin (NEURONTIN) 300 MG capsule Take 300 mg by mouth at bedtime. 07/02/16  Yes [provider]  mometasone (NASONEX) 50 MCG/ACT nasal spray Use one to two sprays in each nostril once daily Patient taking differently: Place 2 sprays into the nose daily as needed (for allergies). Use one to two sprays in each nostril once daily 09/28/15  Yes Kozlow, Donnamarie Poag, MD  traMADol (ULTRAM) 50 MG tablet TAKE 1 TABLET  BY MOUTH EVERY 8 HOURS AS NEEDED Patient taking differently: TAKE 1 TABLET BY MOUTH EVERY 8 HOURS AS NEEDED FOR PAIN 07/03/16  Yes Denita Lung, MD  zonisamide (ZONEGRAN) 100 MG capsule Take 100 mg by mouth at bedtime.     Yes [provider]  meclizine (ANTIVERT) 25 MG tablet Take 1 tablet (25 mg total) by mouth 3 (three) times daily as needed for dizziness. 07/17/16   Clayton Bibles, PA-C    Family History Family History  Problem Relation Age of Onset  . Hypertension Mother   . Heart disease Mother   . Hyperlipidemia Mother   . Diabetes Father   . Hypertension Father   . Heart disease Father   . Hyperlipidemia Father   . Stroke Father   . Cancer Maternal Uncle        Lung and pancreatic cancer  . Lymphoma Sister        NON-HODGKINS LYMPHOMA    Social History Social History  Substance Use Topics  . Smoking status: Current Every Day Smoker    Packs/day: 0.50    Types: Cigarettes  . Smokeless tobacco: Never Used  . Alcohol use 0.0 oz/week     Comment: Rare     Allergies   Aspirin; Codeine; Erythromycin; Lactose intolerance (gi); Nsaids; and Penicillins   Review of Systems Review of Systems  All other systems reviewed and are negative.    Physical Exam Updated Vital Signs BP (!) 155/88 (BP Location: Left Arm)   Pulse 89   Temp 97.3 F (36.3 C) (Oral)   Resp 20   LMP 09/16/2012   SpO2 95%   Physical Exam  Constitutional: She appears well-developed and well-nourished. No distress.  HENT:  Head: Normocephalic and atraumatic.  Neck: Neck supple.  Cardiovascular: Normal rate and regular rhythm.   Pulmonary/Chest: Effort normal and breath sounds normal.  Neurological: She is alert.  CN II-XII intact, EOMs intact, no pronator drift, grip strengths equal bilaterally; strength 5/5 in all extremities, sensation intact in all extremities; finger to nose, heel to shin, rapid alternating movements normal; gait is normal.     Skin: She is not diaphoretic.    Nursing note and vitals reviewed.    ED Treatments / Results  Labs (all labs ordered are listed, but only abnormal results are displayed) Labs Reviewed  I-STAT CHEM 8, ED - Abnormal; Notable for the following:       Result Value   BUN 5 (*)    Calcium, Ion 1.08 (*)    All other components within normal limits    EKG  EKG Interpretation None  Radiology Mr Brain Wo Contrast  Result Date: 07/17/2016 CLINICAL DATA:  Blurry vision, posterior and headache and nausea for week. History of Chiari malformation. EXAM: MRI HEAD WITHOUT CONTRAST TECHNIQUE: Multiplanar, multiecho pulse sequences of the brain and surrounding structures were obtained without intravenous contrast. COMPARISON:  MRI of the head January 26th 2018 FINDINGS: Multiple sequences are moderately or severely motion degraded. BRAIN: No reduced diffusion to suggest acute ischemia. No susceptibility artifact to suggest hemorrhage. The ventricles and sulci are normal for patient's age. No suspicious parenchymal signal, masses or mass effect. No abnormal extra-axial fluid collections. VASCULAR: Normal major intracranial vascular flow voids present at skull base. SKULL AND UPPER CERVICAL SPINE: No abnormal sellar expansion. No suspicious calvarial bone marrow signal. Re- demonstration of cerebellar tonsillar descent 8 mm below the foramen magnum with pointed appearance, and effaced cerebral spinal fluid signal at the foramen magnum. SINUSES/ORBITS: Paranasal sinus mucosal thickening without air-fluid levels. Mastoid air cells are well aerated. The included ocular globes and orbital contents are non-suspicious. OTHER: None. IMPRESSION: Motion degraded examination. Chiari 1 malformation, otherwise negative noncontrast MRI of the head. Electronically Signed   By: Elon Alas M.D.   On: 07/17/2016 18:24    Procedures Procedures (including critical care time)  Medications Ordered in ED Medications  LORazepam (ATIVAN)  injection 1 mg (1 mg Intravenous Given 07/17/16 1708)     Initial Impression / Assessment and Plan / ED Course  I have reviewed the triage vital signs and the nursing notes.  Pertinent labs & imaging results that were available during my care of the patient were reviewed by me and considered in my medical decision making (see chart for details).  Clinical Course as of Jul 18 2230  Mon Jul 17, 2016  1523 I spoke with Dr Ellene Route regarding this patient.  He agrees to go ahead with reimaging and if no concerning change may be treated for vertigo.    [EW]    Clinical Course User Index [EW] [place], Dalana Pfahler, [place]    Afebrile nontoxic patient with known cerebellar tonsillar ectopia and chiari 1 malformation, cervical spine syrinx p/w worsening symptoms x 1 week of vertigo, nausea, diplopia.  Discussed with Dr Vanita Panda and consulted neurosurgery,as above.  MR without change in small chiari malformation.  Pt d/c home with symptomatic treatment for vertigo, PCP , neurosurgery follow up.  Discussed result, findings, treatment, and follow up  with patient.  Pt given return precautions.  Pt verbalizes understanding and agrees with plan.      Final Clinical Impressions(s) / ED Diagnoses   Final diagnoses:  Vertigo    New Prescriptions Discharge Medication List as of 07/17/2016  6:38 PM    START taking these medications   Details  meclizine (ANTIVERT) 25 MG tablet Take 1 tablet (25 mg total) by mouth 3 (three) times daily as needed for dizziness., Starting Mon 07/17/2016, Print         Clayton Bibles, PA-C 07/17/16 2232    Carmin Muskrat, MD 07/21/16 [phone]

## 2016-07-17 NOTE — ED Triage Notes (Signed)
Pt complaint of worsening blurred vision, posterior headache, and nausea over past week; hx of chiari malformation; sent for evaluation by neurosurgeon PA.

## 2016-07-17 NOTE — Discharge Instructions (Signed)
Read the information below.  Use the prescribed medication as directed.  Please discuss all new medications with your pharmacist.  You may return to the Emergency Department at any time for worsening condition or any new symptoms that concern you.    °

## 2016-07-17 NOTE — ED Notes (Signed)
Patient is being transported to MRI.

## 2016-08-05 ENCOUNTER — Other Ambulatory Visit: Payer: Self-pay | Admitting: Allergy and Immunology

## 2016-08-08 ENCOUNTER — Ambulatory Visit (INDEPENDENT_AMBULATORY_CARE_PROVIDER_SITE_OTHER): Payer: BLUE CROSS/BLUE SHIELD | Admitting: Medical

## 2016-08-08 ENCOUNTER — Other Ambulatory Visit: Payer: Self-pay | Admitting: Gynecology

## 2016-08-08 ENCOUNTER — Encounter: Payer: Self-pay | Admitting: Medical

## 2016-08-08 VITALS — BP 128/74 | HR 95 | Wt 184.3 lb

## 2016-08-08 DIAGNOSIS — M4802 Spinal stenosis, cervical region: Secondary | ICD-10-CM | POA: Insufficient documentation

## 2016-08-08 DIAGNOSIS — M62838 Other muscle spasm: Secondary | ICD-10-CM | POA: Diagnosis not present

## 2016-08-08 DIAGNOSIS — M25511 Pain in right shoulder: Secondary | ICD-10-CM | POA: Diagnosis not present

## 2016-08-08 DIAGNOSIS — M549 Dorsalgia, unspecified: Secondary | ICD-10-CM | POA: Insufficient documentation

## 2016-08-08 DIAGNOSIS — R921 Mammographic calcification found on diagnostic imaging of breast: Secondary | ICD-10-CM

## 2016-08-08 DIAGNOSIS — M542 Cervicalgia: Secondary | ICD-10-CM | POA: Insufficient documentation

## 2016-08-08 MED ORDER — CYCLOBENZAPRINE HCL 10 MG PO TABS
ORAL_TABLET | ORAL | 0 refills | Status: DC
Start: 2016-08-08 — End: 2016-10-12

## 2016-08-08 NOTE — Progress Notes (Signed)
Subjective: Chief Complaint  Patient presents with  . possible pinched nerve in shoulder    rt shoulder pain , stated about 6 months ago    Here for c/o possible pinched nerve in right shoulder area.  She notes pinching feeling in right shoulder, going down right arm.  Sometimes pinky finger goes numb.  Pain is sometimes intermittent.  Doesn't always radiate down the arm.  Seems to start in the shoulder blade area.  Worse with certain movements of her arm, lying on right side aggravates it.  Nothing seems to improve it.   Is on gabapentin for Chiari malformation, but that doesn't help.  Not taking anything for it.   Is right handed.  Works Therapist, art, on the phone some, typing constantly.   Walks for exercise.  Has been doing some stretching, but that is not helping.     No other aggravating or relieving factors. No other complaint.  Past Medical History:  Diagnosis Date  . Allergy   . Anxiety   . Arthritis    lower back  . ASCUS (atypical squamous cells of undetermined significance) on Pap smear 03/2011   NEG HR HPV  . Asthma   . Cervical dysplasia, mild 08/2010   LGSIL colposcopy biopsy showing koilocytotic atypia  . Clotting disorder ([place])   . Depression   . Endometriosis   . Headache(784.0)   . High risk HPV infection 12/2011   Pap normal  . Seizures (HCC)    seizure disorder  . Smoker   . Von Willebrand disease ([place])    Current Outpatient Prescriptions on File Prior to Visit  Medication Sig Dispense Refill  . acetaminophen (TYLENOL) 500 MG tablet Take 1,000 mg by mouth every 6 (six) hours as needed. pain    . ADVAIR HFA 115-21 MCG/ACT inhaler INHALE 2 PUFFS BY MOUTH EVERY 12 HOURS TO PREVENT COUGH OR WHEEZE 12 g 4  . divalproex (DEPAKOTE ER) 500 MG 24 hr tablet Take 1,000 mg by mouth 2 (two) times daily.    [name] gabapentin (NEURONTIN) 300 MG capsule Take 300 mg by mouth at bedtime.  5  . meclizine (ANTIVERT) 25 MG tablet Take 1 tablet (25 mg total) by mouth 3 (three)  times daily as needed for dizziness. 20 tablet 0  . mometasone (NASONEX) 50 MCG/ACT nasal spray Use one to two sprays in each nostril once daily (Patient taking differently: Place 2 sprays into the nose daily as needed (for allergies). Use one to two sprays in each nostril once daily) 17 g 5  . PROAIR HFA 108 (90 Base) MCG/ACT inhaler INHALE 2 PUFFS BY MOUTH EVERY 6 HOURS AS NEEDED FOR WHEEZING OR SHORTNESS OF BREATH 8.5 g 0  . zonisamide (ZONEGRAN) 100 MG capsule Take 100 mg by mouth at bedtime.      . traMADol (ULTRAM) 50 MG tablet TAKE 1 TABLET BY MOUTH EVERY 8 HOURS AS NEEDED (Patient not taking: Reported on 08/08/2016) 30 tablet 0   Current Facility-Administered Medications on File Prior to Visit  Medication Dose Route Frequency Provider Last Rate Last Dose  . desmopressin (DDAVP) injection 34.04 mcg  0.3 mcg/kg (Order-Specific) Intravenous Once Fontaine, Belinda Block, MD       ROS as in subjective   Objective: BP 128/74   Pulse 95   Wt 184 lb 4.8 oz (83.6 kg)   LMP 09/16/2012   SpO2 96%   BMI 32.65 kg/m   Gen: wd, WN, nad Skin unremarkable Neck: mild posterolateral bilat tenderness, mildly  decreased ROM in general, worse with extension of neck, otherwise no neck mass, thyromegaly or lymphadenopathy Right shoulder and arm with normal ROM, non tender, but mild pain with shoulder flexion and rotation but pain moreso noted in right upper back Tender along right mid and upper back, along scapular border and trapezius region otherwise back nontender UE neurovascularly intact   Assessment: Encounter Diagnoses  Name Primary?  . Neck pain Yes  . Acute pain of right shoulder   . Upper back pain on right side   . Muscle spasm   . Spinal stenosis in cervical region     Plan: Symptoms and exam suggest shoulder girdle/upper back and neck muscle tension, spasm and inflammation.   Refer to PT.  discussed doing daily stretching routine to keep things loose, consider lap swimming at the  pool.   discussed risks/benefits of flexeril, and can use prn QHS.   reviewed her 02/2016 C spine MRI.   although there was mild to moderate stenosis C5-6, I don't think its causing the current problem.   F/u 72mo, sooner prn.  Aleesha was seen today for possible pinched nerve in shoulder.  Diagnoses and all orders for this visit:  Neck pain -     Ambulatory referral to Physical Therapy  Acute pain of right shoulder -     Ambulatory referral to Physical Therapy  Upper back pain on right side -     Ambulatory referral to Physical Therapy  Muscle spasm -     Ambulatory referral to Physical Therapy  Spinal stenosis in cervical region -     Ambulatory referral to Physical Therapy  Other orders -     cyclobenzaprine (FLEXERIL) 10 MG tablet; 1/2-1 tablet po QHS prn

## 2016-08-08 NOTE — Patient Instructions (Signed)
Massage Therapy:  Janet Blevins [facility] [address] [place], [place] [address] [phone] [email or web address]   

## 2016-08-30 ENCOUNTER — Other Ambulatory Visit: Payer: Self-pay | Admitting: Allergy and Immunology

## 2016-08-31 ENCOUNTER — Other Ambulatory Visit: Payer: Self-pay | Admitting: Allergy and Immunology

## 2016-09-04 ENCOUNTER — Ambulatory Visit
Admission: RE | Admit: 2016-09-04 | Discharge: 2016-09-04 | Disposition: A | Discharge status: Home / Self Care | Payer: BLUE CROSS/BLUE SHIELD | Source: Ambulatory Visit | Attending: Gynecology | Admitting: Gynecology

## 2016-09-04 DIAGNOSIS — R921 Mammographic calcification found on diagnostic imaging of breast: Secondary | ICD-10-CM

## 2016-09-22 ENCOUNTER — Other Ambulatory Visit: Payer: Self-pay | Admitting: Allergy and Immunology

## 2016-10-01 ENCOUNTER — Other Ambulatory Visit: Payer: Self-pay | Admitting: Allergy and Immunology

## 2016-10-02 ENCOUNTER — Other Ambulatory Visit: Payer: Self-pay | Admitting: Medical

## 2016-10-02 NOTE — Telephone Encounter (Signed)
Can pt have a refill on this 

## 2016-10-12 ENCOUNTER — Encounter: Payer: Self-pay | Admitting: Family Medicine

## 2016-10-12 ENCOUNTER — Ambulatory Visit (INDEPENDENT_AMBULATORY_CARE_PROVIDER_SITE_OTHER): Payer: BLUE CROSS/BLUE SHIELD | Admitting: Family Medicine

## 2016-10-12 ENCOUNTER — Other Ambulatory Visit: Payer: Self-pay | Admitting: Neurological Surgery

## 2016-10-12 VITALS — BP 130/80 | HR 93 | Temp 98.3°F | Resp 16 | Wt 192.6 lb

## 2016-10-12 DIAGNOSIS — J069 Acute upper respiratory infection, unspecified: Secondary | ICD-10-CM | POA: Diagnosis not present

## 2016-10-12 DIAGNOSIS — F17200 Nicotine dependence, unspecified, uncomplicated: Secondary | ICD-10-CM | POA: Diagnosis not present

## 2016-10-12 NOTE — Patient Instructions (Signed)
Talk with your neurosurgeon regarding your symptoms and prep for your upcoming surgery.   Let me know if your symptoms are worsening.

## 2016-10-12 NOTE — Progress Notes (Signed)
Subjective: Chief Complaint  Patient presents with  . sinus infection    sinus infection started this morning. pressure started in teeth.      Cassidy Tran is a 41 y.o. female who presents for possible sinus infection.  States this morning her upper teeth were hurting and this is usually her first sign of an impending sinus infection.   Reports history of allergies. States no other new symptoms today.   States she has a history of sinus surgery and recurrent sinus infections.   Last antibiotics were in December 2017.   States she is scheduled for brain surgery for September 10th at 1pm. Her neurosurgeon is Dr. Ellene Route.   Smokes.   Denies taking anything for her symptoms.   ROS as in subjective   Objective: Vitals:   10/12/16 1525  BP: 130/80  Pulse: 93  Resp: 16  Temp: 98.3 F (36.8 C)  SpO2: 98%    General appearance: Alert, WD/WN, no distress                             Skin: warm, no rash                           Head: - sinus tenderness,                            Eyes: conjunctiva normal, corneas clear, PERRLA                            Ears: pearly TMs, external ear canals normal                          Nose: septum midline, turbinates swollen, with erythema and clear discharge             Mouth/throat: MMM, tongue normal, mild pharyngeal erythema                           Neck: supple, no adenopathy, no thyromegaly, nontender                          Heart: RRR, normal S1, S2, no murmurs                         Lungs: CTA bilaterally, no wheezes, rales, or rhonchi       Assessment and Plan: Acute URI  Smoker  Discussed that her symptoms started only a few hours ago and that antibiotics would be inappropriate at this time.  Can use OTC Mucinex for congestion.  Tylenol or Ibuprofen OTC for fever and malaise.  Discussed symptomatic relief, nasal saline flush, and call or return if worse or not improving in 2-3 days.  She is going by her neurosurgeon's  office this afternoon and I recommend that she make him aware of her current symptoms. She can call or return if her symptoms worsen.

## 2016-10-13 ENCOUNTER — Telehealth: Payer: Self-pay | Admitting: Internal Medicine

## 2016-10-13 ENCOUNTER — Ambulatory Visit: Payer: BLUE CROSS/BLUE SHIELD | Admitting: Medical

## 2016-10-13 NOTE — Telephone Encounter (Signed)
Pt called back and spoke with her neurosurgeon and states they ok'ed her to get the antibiotic from you and then follow-up with you prior to surgery. She states that neurosurgeron said for you to do it since pt was seen here for the issue. Please send in antibiotic in to [facility]. Pt is aware it will be next week before you return.

## 2016-10-15 NOTE — Telephone Encounter (Signed)
Antibiotics were not prescribed due to onset of her symptoms being same day as her visit with me. Please have her call back if her symptoms have not improved this week.

## 2016-10-16 NOTE — Telephone Encounter (Signed)
Pt was notified. Pt is still only having pressure in the teeth. No other symptoms at this time. Pt was told to call back if symptoms get worse

## 2016-10-20 ENCOUNTER — Other Ambulatory Visit: Payer: Self-pay | Admitting: Oncology

## 2016-10-24 ENCOUNTER — Encounter: Payer: BLUE CROSS/BLUE SHIELD | Admitting: Oncology

## 2016-10-25 ENCOUNTER — Other Ambulatory Visit: Payer: Self-pay | Admitting: Allergy and Immunology

## 2016-11-01 ENCOUNTER — Encounter: Payer: Self-pay | Admitting: Oncology

## 2016-11-01 ENCOUNTER — Ambulatory Visit (INDEPENDENT_AMBULATORY_CARE_PROVIDER_SITE_OTHER): Payer: BLUE CROSS/BLUE SHIELD | Admitting: Oncology

## 2016-11-01 VITALS — BP 147/67 | HR 90 | Temp 98.2°F | Ht 63.0 in | Wt 197.1 lb

## 2016-11-01 DIAGNOSIS — Z881 Allergy status to other antibiotic agents status: Secondary | ICD-10-CM

## 2016-11-01 DIAGNOSIS — G40909 Epilepsy, unspecified, not intractable, without status epilepticus: Secondary | ICD-10-CM | POA: Diagnosis not present

## 2016-11-01 DIAGNOSIS — Z7951 Long term (current) use of inhaled steroids: Secondary | ICD-10-CM | POA: Diagnosis not present

## 2016-11-01 DIAGNOSIS — Z79899 Other long term (current) drug therapy: Secondary | ICD-10-CM

## 2016-11-01 DIAGNOSIS — G935 Compression of brain: Secondary | ICD-10-CM

## 2016-11-01 DIAGNOSIS — Z885 Allergy status to narcotic agent status: Secondary | ICD-10-CM | POA: Diagnosis not present

## 2016-11-01 DIAGNOSIS — J45909 Unspecified asthma, uncomplicated: Secondary | ICD-10-CM

## 2016-11-01 DIAGNOSIS — Z886 Allergy status to analgesic agent status: Secondary | ICD-10-CM

## 2016-11-01 DIAGNOSIS — D6800 Von Willebrand disease, unspecified: Secondary | ICD-10-CM

## 2016-11-01 DIAGNOSIS — Z880 Allergy status to penicillin: Secondary | ICD-10-CM | POA: Diagnosis not present

## 2016-11-01 LAB — COMPREHENSIVE METABOLIC PANEL
ALBUMIN: 3.6 g/dL (ref 3.5–5.0)
ALK PHOS: 80 U/L (ref 38–126)
ALT: 12 U/L — ABNORMAL LOW (ref 14–54)
AST: 13 U/L — AB (ref 15–41)
Anion gap: 9 (ref 5–15)
BUN: 13 mg/dL (ref 6–20)
CALCIUM: 9.1 mg/dL (ref 8.9–10.3)
CO2: 26 mmol/L (ref 22–32)
CREATININE: 0.76 mg/dL (ref 0.44–1.00)
Chloride: 106 mmol/L (ref 101–111)
GFR calc Af Amer: >60 mL/min (ref >60)
GFR calc non Af Amer: >60 mL/min (ref >60)
GLUCOSE: 90 mg/dL (ref 65–99)
Potassium: 3.9 mmol/L (ref 3.5–5.1)
SODIUM: 141 mmol/L (ref 135–145)
Total Bilirubin: 0.5 mg/dL (ref 0.3–1.2)
Total Protein: 6.6 g/dL (ref 6.5–8.1)

## 2016-11-01 LAB — CBC WITH DIFFERENTIAL/PLATELET
BASOS PCT: 0 %
Basophils Absolute: 0 10*3/uL (ref 0.0–0.1)
EOS ABS: 0.1 10*3/uL (ref 0.0–0.7)
Eosinophils Relative: 1 %
HCT: 39.3 % (ref 36.0–46.0)
HEMOGLOBIN: 12.9 g/dL (ref 12.0–15.0)
LYMPHS ABS: 4.1 10*3/uL — AB (ref 0.7–4.0)
Lymphocytes Relative: 36 %
MCH: 29.5 pg (ref 26.0–34.0)
MCHC: 32.8 g/dL (ref 30.0–36.0)
MCV: 89.7 fL (ref 78.0–100.0)
Monocytes Absolute: 0.6 10*3/uL (ref 0.1–1.0)
Monocytes Relative: 5 %
NEUTROS PCT: 58 %
Neutro Abs: 6.5 10*3/uL (ref 1.7–7.7)
Platelets: 282 10*3/uL (ref 150–400)
RBC: 4.38 MIL/uL (ref 3.87–5.11)
RDW: 14.2 % (ref 11.5–15.5)
WBC: 11.3 10*3/uL — AB (ref 4.0–10.5)

## 2016-11-01 LAB — PROTIME-INR
INR: 0.95
PROTHROMBIN TIME: 12.6 s (ref 11.4–15.2)

## 2016-11-01 LAB — APTT: aPTT: 34 seconds (ref 24–36)

## 2016-11-01 NOTE — Progress Notes (Signed)
New Patient Hematology   Cassidy Tran [id number] 01/30/1976 41 y.o. 11/01/2016  CC: Dr Kristeen Miss   Reason for referral:  Preoperative evaluation in advance of surgery on a type I Chiari malformation in this young woman with history of von Willebrand's disease.  HPI:  41 year old woman with a long-standing seizure disorder since she was in the third grade on multiple anticonvulsant medications currently Depakote extended release, gabapentin, and zonisamide. She was diagnosed with mild type IA von Willebrand's disorder when she was in high school when she developed excessive bleeding after a surgical procedure on her sinuses. She had a prior history of heavy menses but no excessive bleeding after wisdom teeth extraction. There was no family history of any bleeding disorder in her parents, grandparents, or her sister who is 75 years older than her. Her sister was successfully treated for non-Hodgkin's lymphoma.  It was never clear whether her von Willebrand's disorder was congenital or acquired. I first saw her when she was about 41 years old. Since that time she has been covered for minor surgical procedures including a tonsillectomy at age 41 and a laparoscopic hysterectomy without oophorectomy in 2014, with DDAVP and had no excessive bleeding . She has a known type I Chiari malformation. This has been monitored closely over time. It appears to be enlarging over the last year. She states it was 3.75 mm in 2015, 5.5 mm in January 2018 and 6.6 mm in August 2018. MRI report from January 26 measures cerebellar tonsillar ectopia at 5-6 millimeters. No measurements  made on the 07/17/2016 study although report states  there were no changes compared with prior study. The August study is not recorded in [facility]. She has had chronic migraine headaches. She does feel that the frequency and severity of her headaches has increased over time now occurring almost daily and sometimes lasting for days. Intermittently  associated with blurred vision and vertical diplopia. She denies any dysarthria, or focal weakness. She gets occasional paresthesias of her fingertips. Occasional intermittent dysphagia for solids.  She denies any abnormal bleeding specifically no epistaxis, gum bleeding, vaginal bleeding, hematochezia, melena, or hematuria.  Her only other active medical problem is asthma for which she uses intermittent bronchodilators.   PMH: Past Medical History:  Diagnosis Date  . Allergy   . Anxiety   . Arthritis    lower back  . ASCUS (atypical squamous cells of undetermined significance) on Pap smear 03/2011   NEG HR HPV  . Asthma   . Cervical dysplasia, mild 08/2010   LGSIL colposcopy biopsy showing koilocytotic atypia  . Clotting disorder ([facility])   . Depression   . Endometriosis   . Headache(784.0)   . High risk HPV infection 12/2011   Pap normal  . Seizures ([facility])    seizure disorder  . Smoker   . Von Willebrand disease ([place])     Past Surgical History:  Procedure Laterality Date  . BREAST BIOPSY Left   . COLPOSCOPY    . CYSTOSCOPY N/A 10/14/2012   Procedure: CYSTOSCOPY;  Surgeon: [name], MD;  Location: [facility];  Service: Gynecology;  Laterality: N/A;  . DILATION AND CURETTAGE OF UTERUS    . HYSTEROSCOPY W/D&C  10/14/2010   Procedure: DILATATION AND CURETTAGE (D&C) /HYSTEROSCOPY;  Surgeon: [name], MD;  Location: [facility];  Service: Gynecology;  Laterality: N/A;  . LAPAROSCOPIC HYSTERECTOMY N/A 10/14/2012   Procedure: HYSTERECTOMY TOTAL LAPAROSCOPIC;  Surgeon: [name], MD;  Location: [facility];  Service: Gynecology;  Laterality: N/A;  CPT [id number]  2 1/2 hours  Dr. Uvaldo Rising to assist.  . LAPAROSCOPY  04/27/2011   Procedure: LAPAROSCOPY OPERATIVE;  Surgeon: [name], MD;  Location: [facility];  Service: Gynecology;  Laterality: N/A;  removal right cyst , lysis of adhesions, biopsy of peritoneum  . NASAL SINUS SURGERY  1993   IN [place], [place]  .  TONSILLECTOMY    . VAGINAL HYSTERECTOMY    . WISDOM TOOTH EXTRACTION  1993    Allergies: Allergies  Allergen Reactions  . Aspirin Other (See Comments)    Childhood reaction  . Codeine Nausea And Vomiting    Tolerates Hydrocodone  . Erythromycin Other (See Comments)    Childhood reaction, tolerate Zpak  . Lactose Intolerance (Gi) Diarrhea  . Nsaids Nausea And Vomiting  . Penicillins Other (See Comments)    Has patient had a PCN reaction causing immediate rash, facial/tongue/throat swelling, SOB or lightheadedness with hypotension: doesn't remember childhood Reaction  Has patient had a PCN reaction causing severe rash involving mucus membranes or skin necrosis: NO Has patient had a PCN reaction that required hospitalization NO Has patient had a PCN reaction occurring within the last 10 years: NO If all of the above answers are "NO", then may proceed with Cephalosporin use.     Medications:  Current Outpatient Prescriptions:  .  acetaminophen (TYLENOL) 500 MG tablet, Take 1,000 mg by mouth every 6 (six) hours as needed for mild pain or moderate pain. pain , Disp: , Rfl:  .  ADVAIR HFA 115-21 MCG/ACT inhaler, INHALE 2 PUFFS BY MOUTH EVERY 12 HOURS TO PREVENT COUGH OR WHEEZING, Disp: 12 g, Rfl: 1 .  divalproex (DEPAKOTE ER) 500 MG 24 hr tablet, Take 1,000 mg by mouth 2 (two) times daily., Disp: , Rfl:  .  gabapentin (NEURONTIN) 300 MG capsule, Take 300 mg by mouth 2 (two) times daily. , Disp: , Rfl: 5 .  meclizine (ANTIVERT) 25 MG tablet, TAKE 1 TABLET BY MOUTH THREE TIMES DAILY AS NEEDED FOR DIZZINESS, Disp: 20 tablet, Rfl: 0 .  mometasone (NASONEX) 50 MCG/ACT nasal spray, Use one to two sprays in each nostril once daily (Patient taking differently: Place 2 sprays into the nose daily as needed (for allergies). Use one to two sprays in each nostril once daily), Disp: 17 g, Rfl: 5 .  PROAIR HFA 108 (90 Base) MCG/ACT inhaler, INHALE 2 PUFFS BY MOUTH EVERY 4 TO 6 HOURS AS NEEDED FOR COUGH  OR WHEEZING, Disp: 8.5 g, Rfl: 0 .  zonisamide (ZONEGRAN) 100 MG capsule, Take 100 mg by mouth 2 (two) times daily. , Disp: , Rfl:  No current facility-administered medications for this visit.   Facility-Administered Medications Ordered in Other Visits:  .  desmopressin (DDAVP) injection 34.04 mcg, 0.3 mcg/kg (Order-Specific), Intravenous, Once, Fontaine, Belinda Block, MD  Social History: Married. No children. 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 She has never used smokeless tobacco. She does not drink alcohol or use drugs.  Family History: Family History  Problem Relation Age of Onset  . Hypertension Mother   . Heart disease Mother   . Hyperlipidemia Mother   . Diabetes Father   . Hypertension Father   . Heart disease Father   . Hyperlipidemia Father   . Stroke Father   . Cancer Maternal Uncle        Lung and pancreatic cancer  . Lymphoma Sister  NON-HODGKINS LYMPHOMA    Review of Systems:See history of present illnessng ROS negative.  Physical Exam: Blood pressure (!) 147/67, pulse 90, temperature 98.2 F (36.8 C), temperature source Oral, height 5\' 3"  (1.6 m), weight 197 lb 1.6 oz (89.4 kg), last menstrual period 09/16/2012, SpO2 98 %. Wt Readings from Last 3 Encounters:  11/01/16 197 lb 1.6 oz (89.4 kg)  10/12/16 192 lb 9.6 oz (87.4 kg)  08/08/16 184 lb 4.8 oz (83.6 kg)     General appearance: Well nourished Caucasian woman  HENNT: Pharynx no erythema, exudate, mass, or ulcer. No thyromegaly or thyroid nodules Lymph nodes: No cervical, supraclavicular, or axillary lymphadenopathy Breasts:   Lungs: Clear to auscultation, resonant to percussion throughout Heart: Regular rhythm, no murmur, no gallop, no rub, no click, no edema Abdomen: Soft, nontender, normal bowel sounds, no mass, no organomegaly Extremities: No edema, no calf tenderness Musculoskeletal: no joint deformities GU:  Vascular: Carotid  pulses 2+, no bruits, distal pulses: Neurologic: Alert, oriented, PERRLA, optic discs sharp and vessels normal, no hemorrhage or exudate, cranial nerves grossly normal, motor strength 5 over 5, reflexes 1+ symmetric, upper body coordination normal, gait normal, Skin: No rash or ecchymosis    Lab Results: Lab Results  Component Value Date   WBC 8.2 12/30/2015   HGB 14.6 07/17/2016   HCT 43.0 07/17/2016   MCV 91.0 12/30/2015   PLT 395 12/30/2015     Chemistry      Component Value Date/Time   NA 140 07/17/2016 1427   K 3.5 07/17/2016 1427   CL 103 07/17/2016 1427   CO2 22 12/30/2015 0815   BUN 5 (L) 07/17/2016 1427   CREATININE 0.50 07/17/2016 1427   CREATININE 0.77 12/30/2015 0815      Component Value Date/Time   CALCIUM 9.0 12/30/2015 0815   ALKPHOS 86 12/30/2015 0815   AST 13 12/30/2015 0815   ALT 13 12/30/2015 0815   BILITOT 0.8 12/30/2015 0815          Radiological Studies: No results found.    Impression: Type I von Willebrand's disorder congenital versus acquired related to antiseizure medications  In view of need for major intracranial surgery, she will need to be covered with clotting concentrate. We cannot rely on DDAVP. The preferred product is Humate-P which contains both factor VIII and von Willebrand factor. Although there is now a purified recombinant von Willebrand factor, this product works bested given with concomitant factor VIII.  Recommendation: Based on current weight 89 kg an approximate half-life of Humate-P of 8-12 hours, I will order Humate-P 50 units per kilogram every 8 hours for the first 24 hours, every 12 hours for the next 48-hour's, then every 24 hours to complete 7 days total. Potential side effects including allergic reactions and remote possibility of infection transmission discussed with the patient and her husband.  I will get a baseline von Willebrand profile today and then follow ristocetin cofactor activity  perioperatively.      [name], MD, [place]  Hematology-Oncology/Internal Medicine  11/01/2016, 3:33 PM

## 2016-11-01 NOTE — Progress Notes (Addendum)
PCP: Chana Bode, PA-C  Cardiologist: pt denies  EKG: pt denies past year  Stress test: pt denies ever  ECHO: pt denies ever  Cardiac Cath: pt denies ever  Chest x-ray: pt denies past year, no recent respiratory complications, infections

## 2016-11-01 NOTE — Patient Instructions (Signed)
To lab today Return visit as needed - we will set up if needed after surgery

## 2016-11-01 NOTE — Pre-Procedure Instructions (Signed)
Julanne Schlueter  11/01/2016      [place], [place] [address] [place] [address] Phone: [phone] Fax: [phone]  [facility] [place], [place] [address] Phone: [phone] Fax: [phone]    Your procedure is scheduled on November 06, 2016.  Report to [facility] Admitting at 1100 AM.  Call this number if you have problems the morning of surgery:  [phone]   Remember:  Do not eat food or drink liquids after midnight.  Take these medicines the morning of surgery with A SIP OF WATER acetaminophen (tylenol), advair inhaler (bring inhaler with you), divalproex (depakote ER), gabapentin (neurontin), meclinzine (antivert)-if needed, mometasone (nasonex), zoniesamide (zonegran).  7 days prior to surgery STOP taking any Aspirin, Aleve, Naproxen, Ibuprofen, Motrin, Advil, Goody's, BC's, all herbal medications, fish oil, and all vitamins   Do not wear jewelry, make-up or nail polish.  Do not wear lotions, powders, or perfumes, or deoderant.  Do not shave 48 hours prior to surgery.    Do not bring valuables to the hospital.  [facility] is not responsible for any belongings or valuables.  Contacts, dentures or bridgework may not be worn into surgery.  Leave your suitcase in the car.  After surgery it may be brought to your room.  For patients admitted to the hospital, discharge time will be determined by your treatment team.  Patients discharged the day of surgery will not be allowed to drive home.   Special instructions:   [facility]- Preparing For Surgery  Before surgery, you can play an important role. Because skin is not sterile, your skin needs to be as free of germs as possible. You can reduce the number of germs on your skin by washing with CHG (chlorahexidine gluconate) Soap before surgery.  CHG is an  antiseptic cleaner which kills germs and bonds with the skin to continue killing germs even after washing.  Please do not use if you have an allergy to CHG or antibacterial soaps. If your skin becomes reddened/irritated stop using the CHG.  Do not shave (including legs and underarms) for at least 48 hours prior to first CHG shower. It is OK to shave your face.  Please follow these instructions carefully.   1. Shower the NIGHT BEFORE SURGERY and the MORNING OF SURGERY with CHG.   2. If you chose to wash your hair, wash your hair first as usual with your normal shampoo.  3. After you shampoo, rinse your hair and body thoroughly to remove the shampoo.  4. Use CHG as you would any other liquid soap. You can apply CHG directly to the skin and wash gently with a scrungie or a clean washcloth.   5. Apply the CHG Soap to your body ONLY FROM THE NECK DOWN.  Do not use on open wounds or open sores. Avoid contact with your eyes, ears, mouth and genitals (private parts). Wash genitals (private parts) with your normal soap.  6. Wash thoroughly, paying special attention to the area where your surgery will be performed.  7. Thoroughly rinse your body with warm water from the neck down.  8. DO NOT shower/wash with your normal soap after using and rinsing off the CHG Soap.  9. Pat yourself dry with a CLEAN TOWEL.   10. Wear CLEAN PAJAMAS   11. Place CLEAN  SHEETS on your bed the night of your first shower and DO NOT SLEEP WITH PETS.    Day of Surgery: Do not apply any deodorants/lotions. Please wear clean clothes to the hospital/surgery center.     Please read over the following fact sheets that you were given.

## 2016-11-02 ENCOUNTER — Other Ambulatory Visit: Payer: Self-pay

## 2016-11-02 ENCOUNTER — Encounter (HOSPITAL_COMMUNITY)
Admission: RE | Admit: 2016-11-02 | Discharge: 2016-11-02 | Disposition: A | Discharge status: Home / Self Care | Payer: BLUE CROSS/BLUE SHIELD | Source: Ambulatory Visit | Attending: Neurological Surgery | Admitting: Neurological Surgery

## 2016-11-02 ENCOUNTER — Encounter (HOSPITAL_COMMUNITY): Payer: Self-pay

## 2016-11-02 DIAGNOSIS — Z01812 Encounter for preprocedural laboratory examination: Secondary | ICD-10-CM | POA: Insufficient documentation

## 2016-11-02 DIAGNOSIS — G935 Compression of brain: Secondary | ICD-10-CM | POA: Insufficient documentation

## 2016-11-02 LAB — TYPE AND SCREEN
ABO/RH(D): O POS
Antibody Screen: NEGATIVE

## 2016-11-02 LAB — ABO/RH: ABO/RH(D): O POS

## 2016-11-03 ENCOUNTER — Other Ambulatory Visit: Payer: Self-pay | Admitting: Oncology

## 2016-11-03 DIAGNOSIS — D6801 Von willebrand disease, type 1: Secondary | ICD-10-CM

## 2016-11-03 DIAGNOSIS — D680 Von Willebrand's disease: Secondary | ICD-10-CM

## 2016-11-06 ENCOUNTER — Encounter (HOSPITAL_COMMUNITY): Payer: Self-pay | Admitting: Anesthesiology

## 2016-11-06 ENCOUNTER — Inpatient Hospital Stay (HOSPITAL_COMMUNITY)
Admission: RE | Admit: 2016-11-06 | Discharge: 2016-11-13 | DRG: 026 | Disposition: A | Discharge status: Home / Self Care | Payer: BLUE CROSS/BLUE SHIELD | Source: Ambulatory Visit | Attending: Neurological Surgery | Admitting: Neurological Surgery

## 2016-11-06 ENCOUNTER — Inpatient Hospital Stay (HOSPITAL_COMMUNITY): Payer: BLUE CROSS/BLUE SHIELD | Admitting: Anesthesiology

## 2016-11-06 ENCOUNTER — Encounter (HOSPITAL_COMMUNITY)
Admission: RE | Disposition: A | Discharge status: Home / Self Care | Payer: Self-pay | Source: Ambulatory Visit | Attending: Neurological Surgery

## 2016-11-06 DIAGNOSIS — G40909 Epilepsy, unspecified, not intractable, without status epilepticus: Secondary | ICD-10-CM

## 2016-11-06 DIAGNOSIS — G935 Compression of brain: Secondary | ICD-10-CM | POA: Diagnosis present

## 2016-11-06 DIAGNOSIS — D680 Von Willebrand's disease: Secondary | ICD-10-CM

## 2016-11-06 DIAGNOSIS — F17200 Nicotine dependence, unspecified, uncomplicated: Secondary | ICD-10-CM | POA: Diagnosis present

## 2016-11-06 DIAGNOSIS — J45909 Unspecified asthma, uncomplicated: Secondary | ICD-10-CM | POA: Diagnosis present

## 2016-11-06 DIAGNOSIS — R51 Headache: Secondary | ICD-10-CM | POA: Diagnosis present

## 2016-11-06 DIAGNOSIS — D6801 Von willebrand disease, type 1: Secondary | ICD-10-CM

## 2016-11-06 DIAGNOSIS — R5383 Other fatigue: Secondary | ICD-10-CM | POA: Diagnosis present

## 2016-11-06 HISTORY — PX: SUBOCCIPITAL CRANIECTOMY CERVICAL LAMINECTOMY: SHX5404

## 2016-11-06 LAB — MRSA PCR SCREENING: MRSA by PCR: NEGATIVE

## 2016-11-06 SURGERY — SUBOCCIPITAL CRANIECTOMY CERVICAL LAMINECTOMY/DURAPLASTY
Anesthesia: General | Site: Neck

## 2016-11-06 MED ORDER — CHLORHEXIDINE GLUCONATE CLOTH 2 % EX PADS: 6.0000 | MEDICATED_PAD | Freq: Once | CUTANEOUS | Status: DC

## 2016-11-06 MED ORDER — SUGAMMADEX SODIUM 200 MG/2ML IV SOLN
INTRAVENOUS | Status: AC
  Filled 2016-11-06: qty 2

## 2016-11-06 MED ORDER — LIDOCAINE-EPINEPHRINE 1 %-1:100000 IJ SOLN
INTRAMUSCULAR | Status: DC | PRN
  Administered 2016-11-06: 5 mL

## 2016-11-06 MED ORDER — ACETAMINOPHEN 325 MG PO TABS
650.0000 mg | ORAL_TABLET | ORAL | Status: DC | PRN
  Administered 2016-11-06 – 2016-11-08 (×3): 650 mg via ORAL
  Filled 2016-11-06 (×3): qty 2

## 2016-11-06 MED ORDER — DOCUSATE SODIUM 100 MG PO CAPS
100.0000 mg | ORAL_CAPSULE | Freq: Two times a day (BID) | ORAL | Status: DC
  Administered 2016-11-06 – 2016-11-12 (×8): 100 mg via ORAL
  Filled 2016-11-06 (×13): qty 1

## 2016-11-06 MED ORDER — LIDOCAINE 2% (20 MG/ML) 5 ML SYRINGE
INTRAMUSCULAR | Status: AC
  Filled 2016-11-06: qty 15

## 2016-11-06 MED ORDER — LACTATED RINGERS IV SOLN
INTRAVENOUS | Status: DC
  Administered 2016-11-06 – 2016-11-08 (×3): via INTRAVENOUS

## 2016-11-06 MED ORDER — FENTANYL CITRATE (PF) 100 MCG/2ML IJ SOLN
100.0000 ug | Freq: Once | INTRAMUSCULAR | Status: AC
  Administered 2016-11-06: 50 ug via INTRAVENOUS
  Filled 2016-11-06: qty 2

## 2016-11-06 MED ORDER — THROMBIN 5000 UNITS EX SOLR
CUTANEOUS | Status: AC
  Filled 2016-11-06: qty 5000

## 2016-11-06 MED ORDER — SUGAMMADEX SODIUM 500 MG/5ML IV SOLN
INTRAVENOUS | Status: AC
  Filled 2016-11-06: qty 5

## 2016-11-06 MED ORDER — FENTANYL CITRATE (PF) 100 MCG/2ML IJ SOLN
25.0000 ug | INTRAMUSCULAR | Status: DC | PRN
  Administered 2016-11-06: 50 ug via INTRAVENOUS

## 2016-11-06 MED ORDER — ACETAMINOPHEN 325 MG PO TABS: 325.0000 mg | ORAL_TABLET | ORAL | Status: DC | PRN

## 2016-11-06 MED ORDER — THROMBIN 20000 UNITS EX SOLR
CUTANEOUS | Status: AC
  Filled 2016-11-06: qty 20000

## 2016-11-06 MED ORDER — MOMETASONE FURO-FORMOTEROL FUM 200-5 MCG/ACT IN AERO
2.0000 | INHALATION_SPRAY | Freq: Two times a day (BID) | RESPIRATORY_TRACT | Status: DC
  Administered 2016-11-06 – 2016-11-13 (×12): 2 via RESPIRATORY_TRACT
  Filled 2016-11-06: qty 8.8

## 2016-11-06 MED ORDER — PHENYLEPHRINE HCL 10 MG/ML IJ SOLN
INTRAMUSCULAR | Status: DC | PRN
  Administered 2016-11-06 (×2): 80 ug via INTRAVENOUS

## 2016-11-06 MED ORDER — OXYCODONE HCL 5 MG/5ML PO SOLN: 5.0000 mg | Freq: Once | ORAL | Status: DC | PRN

## 2016-11-06 MED ORDER — LACTATED RINGERS IV SOLN
INTRAVENOUS | Status: DC | PRN
  Administered 2016-11-06: 12:00:00 via INTRAVENOUS

## 2016-11-06 MED ORDER — ANTIHEMOPHILIC FACTOR-VWF 250-600 UNITS IV SOLR
50.0000 [IU]/kg | Freq: Three times a day (TID) | INTRAVENOUS | Status: DC
  Filled 2016-11-06: qty 4415

## 2016-11-06 MED ORDER — THROMBIN 20000 UNITS EX SOLR: OROMUCOSAL | Status: DC | PRN

## 2016-11-06 MED ORDER — MIDAZOLAM HCL 2 MG/2ML IJ SOLN
INTRAMUSCULAR | Status: AC
  Administered 2016-11-06: 2 mg via INTRAVENOUS
  Filled 2016-11-06: qty 2

## 2016-11-06 MED ORDER — ROCURONIUM BROMIDE 10 MG/ML (PF) SYRINGE
PREFILLED_SYRINGE | INTRAVENOUS | Status: AC
  Filled 2016-11-06: qty 15

## 2016-11-06 MED ORDER — PHENYLEPHRINE 40 MCG/ML (10ML) SYRINGE FOR IV PUSH (FOR BLOOD PRESSURE SUPPORT)
PREFILLED_SYRINGE | INTRAVENOUS | Status: AC
  Filled 2016-11-06: qty 10

## 2016-11-06 MED ORDER — DIVALPROEX SODIUM ER 500 MG PO TB24
1000.0000 mg | ORAL_TABLET | Freq: Two times a day (BID) | ORAL | Status: DC
  Administered 2016-11-06 – 2016-11-13 (×14): 1000 mg via ORAL
  Filled 2016-11-06 (×14): qty 2

## 2016-11-06 MED ORDER — MECLIZINE HCL 12.5 MG PO TABS
12.5000 mg | ORAL_TABLET | Freq: Three times a day (TID) | ORAL | Status: DC | PRN
  Filled 2016-11-06: qty 1

## 2016-11-06 MED ORDER — BACITRACIN ZINC 500 UNIT/GM EX OINT
TOPICAL_OINTMENT | CUTANEOUS | Status: AC
  Filled 2016-11-06: qty 28.35

## 2016-11-06 MED ORDER — FLUTICASONE PROPIONATE 50 MCG/ACT NA SUSP
1.0000 | Freq: Every day | NASAL | Status: DC
  Administered 2016-11-06 – 2016-11-13 (×6): 1 via NASAL
  Filled 2016-11-06: qty 16

## 2016-11-06 MED ORDER — ALBUTEROL SULFATE (2.5 MG/3ML) 0.083% IN NEBU
2.5000 mg | INHALATION_SOLUTION | RESPIRATORY_TRACT | Status: DC | PRN
  Filled 2016-11-06: qty 3

## 2016-11-06 MED ORDER — LABETALOL HCL 5 MG/ML IV SOLN: 10.0000 mg | INTRAVENOUS | Status: DC | PRN

## 2016-11-06 MED ORDER — SUGAMMADEX SODIUM 500 MG/5ML IV SOLN
INTRAVENOUS | Status: DC | PRN
  Administered 2016-11-06: 100 mg via INTRAVENOUS
  Administered 2016-11-06: 200 mg via INTRAVENOUS

## 2016-11-06 MED ORDER — PANTOPRAZOLE SODIUM 40 MG IV SOLR
40.0000 mg | Freq: Every day | INTRAVENOUS | Status: DC
  Administered 2016-11-06 – 2016-11-11 (×6): 40 mg via INTRAVENOUS
  Filled 2016-11-06 (×6): qty 40

## 2016-11-06 MED ORDER — CEFAZOLIN SODIUM 1 G IJ SOLR
INTRAMUSCULAR | Status: AC
  Filled 2016-11-06: qty 20

## 2016-11-06 MED ORDER — BUPIVACAINE HCL (PF) 0.5 % IJ SOLN
INTRAMUSCULAR | Status: AC
  Filled 2016-11-06: qty 30

## 2016-11-06 MED ORDER — ONDANSETRON HCL 4 MG/2ML IJ SOLN
INTRAMUSCULAR | Status: AC
  Filled 2016-11-06: qty 6

## 2016-11-06 MED ORDER — THROMBIN 5000 UNITS EX SOLR
CUTANEOUS | Status: AC
  Filled 2016-11-06: qty 10000

## 2016-11-06 MED ORDER — ACETAMINOPHEN 160 MG/5ML PO SOLN: 325.0000 mg | ORAL | Status: DC | PRN

## 2016-11-06 MED ORDER — VANCOMYCIN HCL IN DEXTROSE 1-5 GM/200ML-% IV SOLN
1000.0000 mg | INTRAVENOUS | Status: AC
  Administered 2016-11-06: 1000 mg via INTRAVENOUS
  Filled 2016-11-06: qty 200

## 2016-11-06 MED ORDER — PHENYLEPHRINE HCL 10 MG/ML IJ SOLN
INTRAMUSCULAR | Status: DC | PRN
  Administered 2016-11-06: 35 ug/min via INTRAVENOUS

## 2016-11-06 MED ORDER — PROPOFOL 10 MG/ML IV BOLUS
INTRAVENOUS | Status: AC
  Filled 2016-11-06: qty 20

## 2016-11-06 MED ORDER — LIDOCAINE HCL (CARDIAC) 20 MG/ML IV SOLN
INTRAVENOUS | Status: DC | PRN
  Administered 2016-11-06: 50 mg via INTRAVENOUS

## 2016-11-06 MED ORDER — ONDANSETRON HCL 4 MG PO TABS
4.0000 mg | ORAL_TABLET | ORAL | Status: DC | PRN
  Administered 2016-11-08: 4 mg via ORAL
  Filled 2016-11-06: qty 1

## 2016-11-06 MED ORDER — DEXAMETHASONE SODIUM PHOSPHATE 10 MG/ML IJ SOLN
INTRAMUSCULAR | Status: DC | PRN
  Administered 2016-11-06: 10 mg via INTRAVENOUS

## 2016-11-06 MED ORDER — LIDOCAINE-EPINEPHRINE 1 %-1:100000 IJ SOLN
INTRAMUSCULAR | Status: AC
  Filled 2016-11-06: qty 1

## 2016-11-06 MED ORDER — MIDAZOLAM HCL 5 MG/5ML IJ SOLN
INTRAMUSCULAR | Status: DC | PRN
  Administered 2016-11-06: 2 mg via INTRAVENOUS

## 2016-11-06 MED ORDER — PROPOFOL 10 MG/ML IV BOLUS
INTRAVENOUS | Status: DC | PRN
  Administered 2016-11-06: 150 mg via INTRAVENOUS

## 2016-11-06 MED ORDER — ARTIFICIAL TEARS OPHTHALMIC OINT
TOPICAL_OINTMENT | OPHTHALMIC | Status: DC | PRN
  Administered 2016-11-06: 1 via OPHTHALMIC

## 2016-11-06 MED ORDER — GABAPENTIN 300 MG PO CAPS
300.0000 mg | ORAL_CAPSULE | Freq: Two times a day (BID) | ORAL | Status: DC
  Administered 2016-11-06 – 2016-11-13 (×14): 300 mg via ORAL
  Filled 2016-11-06 (×14): qty 1

## 2016-11-06 MED ORDER — OXYCODONE HCL 5 MG PO TABS: 5.0000 mg | ORAL_TABLET | Freq: Once | ORAL | Status: DC | PRN

## 2016-11-06 MED ORDER — MIDAZOLAM HCL 2 MG/2ML IJ SOLN
INTRAMUSCULAR | Status: AC
  Filled 2016-11-06: qty 2

## 2016-11-06 MED ORDER — MIDAZOLAM HCL 2 MG/2ML IJ SOLN
2.0000 mg | Freq: Once | INTRAMUSCULAR | Status: AC
  Administered 2016-11-06: 2 mg via INTRAVENOUS
  Filled 2016-11-06: qty 2

## 2016-11-06 MED ORDER — FENTANYL CITRATE (PF) 100 MCG/2ML IJ SOLN
INTRAMUSCULAR | Status: DC | PRN
  Administered 2016-11-06 (×2): 50 ug via INTRAVENOUS
  Administered 2016-11-06: 100 ug via INTRAVENOUS

## 2016-11-06 MED ORDER — KETOROLAC TROMETHAMINE 30 MG/ML IJ SOLN: 30.0000 mg | Freq: Four times a day (QID) | INTRAMUSCULAR | Status: DC | PRN

## 2016-11-06 MED ORDER — HYDROMORPHONE HCL 1 MG/ML IJ SOLN
1.0000 mg | INTRAMUSCULAR | Status: DC | PRN
  Administered 2016-11-06 – 2016-11-07 (×4): 1 mg via INTRAVENOUS
  Filled 2016-11-06 (×5): qty 1

## 2016-11-06 MED ORDER — BISACODYL 10 MG RE SUPP: 10.0000 mg | Freq: Every day | RECTAL | Status: DC | PRN

## 2016-11-06 MED ORDER — ROCURONIUM BROMIDE 100 MG/10ML IV SOLN
INTRAVENOUS | Status: DC | PRN
  Administered 2016-11-06: 40 mg via INTRAVENOUS
  Administered 2016-11-06 (×5): 20 mg via INTRAVENOUS

## 2016-11-06 MED ORDER — ONDANSETRON HCL 4 MG/2ML IJ SOLN
INTRAMUSCULAR | Status: DC | PRN
  Administered 2016-11-06: 4 mg via INTRAVENOUS

## 2016-11-06 MED ORDER — BACITRACIN ZINC 500 UNIT/GM EX OINT
TOPICAL_OINTMENT | CUTANEOUS | Status: DC | PRN
  Administered 2016-11-06: 1 via TOPICAL

## 2016-11-06 MED ORDER — FENTANYL CITRATE (PF) 100 MCG/2ML IJ SOLN
INTRAMUSCULAR | Status: AC
  Administered 2016-11-06: 50 ug via INTRAVENOUS
  Filled 2016-11-06: qty 2

## 2016-11-06 MED ORDER — POLYETHYLENE GLYCOL 3350 17 G PO PACK: 17.0000 g | PACK | Freq: Every day | ORAL | Status: DC | PRN

## 2016-11-06 MED ORDER — ZONISAMIDE 100 MG PO CAPS
100.0000 mg | ORAL_CAPSULE | Freq: Two times a day (BID) | ORAL | Status: DC
  Administered 2016-11-06 – 2016-11-13 (×14): 100 mg via ORAL
  Filled 2016-11-06 (×15): qty 1

## 2016-11-06 MED ORDER — THROMBIN 5000 UNITS EX SOLR
OROMUCOSAL | Status: DC | PRN
  Administered 2016-11-06 (×2): 5 mL via TOPICAL

## 2016-11-06 MED ORDER — ONDANSETRON HCL 4 MG/2ML IJ SOLN
4.0000 mg | INTRAMUSCULAR | Status: DC | PRN
  Administered 2016-11-07 – 2016-11-10 (×8): 4 mg via INTRAVENOUS
  Filled 2016-11-06 (×8): qty 2

## 2016-11-06 MED ORDER — ARTIFICIAL TEARS OPHTHALMIC OINT
TOPICAL_OINTMENT | OPHTHALMIC | Status: AC
  Filled 2016-11-06: qty 10.5

## 2016-11-06 MED ORDER — DEXAMETHASONE SODIUM PHOSPHATE 10 MG/ML IJ SOLN
INTRAMUSCULAR | Status: AC
  Filled 2016-11-06: qty 3

## 2016-11-06 MED ORDER — BUPIVACAINE HCL (PF) 0.5 % IJ SOLN
INTRAMUSCULAR | Status: DC | PRN
  Administered 2016-11-06: 5 mL

## 2016-11-06 MED ORDER — ANTIHEMOPHILIC FACTOR-VWF 250-600 UNITS IV SOLR
4248.0000 [IU] | Freq: Three times a day (TID) | INTRAVENOUS | Status: AC
  Administered 2016-11-06 – 2016-11-07 (×3): 4248 [IU] via INTRAVENOUS
  Filled 2016-11-06: qty 4248
  Filled 2016-11-06: qty 0
  Filled 2016-11-06 (×2): qty 4248

## 2016-11-06 MED ORDER — SENNA 8.6 MG PO TABS
1.0000 | ORAL_TABLET | Freq: Two times a day (BID) | ORAL | Status: DC
  Administered 2016-11-06 – 2016-11-12 (×8): 8.6 mg via ORAL
  Filled 2016-11-06 (×13): qty 1

## 2016-11-06 MED ORDER — FENTANYL CITRATE (PF) 100 MCG/2ML IJ SOLN
INTRAMUSCULAR | Status: AC
Start: 2016-11-06 — End: 2016-11-06
  Administered 2016-11-06: 50 ug via INTRAVENOUS
  Filled 2016-11-06: qty 2

## 2016-11-06 MED ORDER — 0.9 % SODIUM CHLORIDE (POUR BTL) OPTIME
TOPICAL | Status: DC | PRN
  Administered 2016-11-06 (×3): 1000 mL

## 2016-11-06 MED ORDER — ROCURONIUM BROMIDE 10 MG/ML (PF) SYRINGE
PREFILLED_SYRINGE | INTRAVENOUS | Status: AC
  Filled 2016-11-06: qty 5

## 2016-11-06 MED ORDER — PROMETHAZINE HCL 25 MG PO TABS
12.5000 mg | ORAL_TABLET | ORAL | Status: DC | PRN
  Administered 2016-11-07: 25 mg via ORAL
  Filled 2016-11-06: qty 1

## 2016-11-06 MED ORDER — FLEET ENEMA 7-19 GM/118ML RE ENEM: 1.0000 | ENEMA | Freq: Once | RECTAL | Status: DC | PRN

## 2016-11-06 MED ORDER — FENTANYL CITRATE (PF) 250 MCG/5ML IJ SOLN
INTRAMUSCULAR | Status: AC
  Filled 2016-11-06: qty 5

## 2016-11-06 MED ORDER — THROMBIN 20000 UNITS EX SOLR
CUTANEOUS | Status: DC | PRN
  Administered 2016-11-06: 20 mL via TOPICAL

## 2016-11-06 SURGICAL SUPPLY — 79 items
ADH SKN CLS APL DERMABOND .7 (GAUZE/BANDAGES/DRESSINGS) ×1
APL SKNCLS STERI-STRIP NONHPOA (GAUZE/BANDAGES/DRESSINGS)
BAG DECANTER FOR FLEXI CONT (MISCELLANEOUS) ×2 IMPLANT
BENZOIN TINCTURE PRP APPL 2/3 (GAUZE/BANDAGES/DRESSINGS) IMPLANT
BLADE CLIPPER SURG (BLADE) ×3 IMPLANT
BLADE SURG 11 STRL SS (BLADE) ×1 IMPLANT
BLADE ULTRA TIP 2M (BLADE) IMPLANT
BUR CUTTER 7.0 ROUND (BURR) ×2 IMPLANT
BUR MATCHSTICK NEURO 3.0 LAGG (BURR) ×1 IMPLANT
CANISTER SUCT 3000ML PPV (MISCELLANEOUS) ×2 IMPLANT
CARTRIDGE OIL MAESTRO DRILL (MISCELLANEOUS) ×1 IMPLANT
CLIP VESOCCLUDE MED 6/CT (CLIP) IMPLANT
CORD BIPOLAR FORCEPS 12FT (ELECTRODE) ×2 IMPLANT
DECANTER SPIKE VIAL GLASS SM (MISCELLANEOUS) ×2 IMPLANT
DERMABOND ADVANCED (GAUZE/BANDAGES/DRESSINGS) ×1
DERMABOND ADVANCED .7 DNX12 (GAUZE/BANDAGES/DRESSINGS) IMPLANT
DEVICE DISSECT PLASMABLAD 3.0S (MISCELLANEOUS) IMPLANT
DIFFUSER DRILL AIR PNEUMATIC (MISCELLANEOUS) ×1 IMPLANT
DRAPE LAPAROTOMY 100X72 PEDS (DRAPES) ×2 IMPLANT
DRAPE MICROSCOPE LEICA (MISCELLANEOUS) ×1 IMPLANT
DRAPE WARM FLUID 44X44 (DRAPE) ×2 IMPLANT
DRSG OPSITE POSTOP 4X6 (GAUZE/BANDAGES/DRESSINGS) ×1 IMPLANT
DURAMATRIX ONLAY 2X2 (Neuro Prosthesis/Implant) ×1 IMPLANT
ELECT CAUTERY BLADE 6.4 (BLADE) ×1 IMPLANT
ELECT REM PT RETURN 9FT ADLT (ELECTROSURGICAL) ×2
ELECTRODE REM PT RTRN 9FT ADLT (ELECTROSURGICAL) ×1 IMPLANT
EVACUATOR 1/8 PVC DRAIN (DRAIN) IMPLANT
EVACUATOR SILICONE 100CC (DRAIN) IMPLANT
GAUZE SPONGE 4X4 12PLY STRL (GAUZE/BANDAGES/DRESSINGS) IMPLANT
GAUZE SPONGE 4X4 16PLY XRAY LF (GAUZE/BANDAGES/DRESSINGS) IMPLANT
GLOVE BIO SURGEON STRL SZ 6.5 (GLOVE) ×2 IMPLANT
GLOVE BIO SURGEON STRL SZ7.5 (GLOVE) IMPLANT
GLOVE BIOGEL PI IND STRL 6.5 (GLOVE) IMPLANT
GLOVE BIOGEL PI IND STRL 7.5 (GLOVE) IMPLANT
GLOVE BIOGEL PI IND STRL 8.5 (GLOVE) ×1 IMPLANT
GLOVE BIOGEL PI INDICATOR 6.5 (GLOVE) ×1
GLOVE BIOGEL PI INDICATOR 7.5 (GLOVE)
GLOVE BIOGEL PI INDICATOR 8.5 (GLOVE) ×1
GLOVE ECLIPSE 8.5 STRL (GLOVE) ×2 IMPLANT
GLOVE EXAM NITRILE LRG STRL (GLOVE) IMPLANT
GLOVE EXAM NITRILE XL STR (GLOVE) IMPLANT
GLOVE EXAM NITRILE XS STR PU (GLOVE) IMPLANT
GOWN STRL REUS W/ TWL LRG LVL3 (GOWN DISPOSABLE) IMPLANT
GOWN STRL REUS W/ TWL XL LVL3 (GOWN DISPOSABLE) ×1 IMPLANT
GOWN STRL REUS W/TWL 2XL LVL3 (GOWN DISPOSABLE) ×2 IMPLANT
GOWN STRL REUS W/TWL LRG LVL3 (GOWN DISPOSABLE) ×4
GOWN STRL REUS W/TWL XL LVL3 (GOWN DISPOSABLE)
HEMOSTAT POWDER SURGIFOAM 1G (HEMOSTASIS) ×2 IMPLANT
HEMOSTAT SURGICEL 2X14 (HEMOSTASIS) ×1 IMPLANT
KIT BASIN OR (CUSTOM PROCEDURE TRAY) ×2 IMPLANT
KIT ROOM TURNOVER OR (KITS) ×2 IMPLANT
NEEDLE HYPO 22GX1.5 SAFETY (NEEDLE) ×2 IMPLANT
NS IRRIG 1000ML POUR BTL (IV SOLUTION) ×4 IMPLANT
OIL CARTRIDGE MAESTRO DRILL (MISCELLANEOUS)
PACK CRANIOTOMY (CUSTOM PROCEDURE TRAY) ×2 IMPLANT
PAD ARMBOARD 7.5X6 YLW CONV (MISCELLANEOUS) ×7 IMPLANT
PATTIES SURGICAL 1/4 X 3 (GAUZE/BANDAGES/DRESSINGS) IMPLANT
PATTIES SURGICAL 1X1 (DISPOSABLE) ×1 IMPLANT
PLASMABLADE 3.0S (MISCELLANEOUS) ×2
RUBBERBAND STERILE (MISCELLANEOUS) ×2 IMPLANT
SPONGE LAP 18X18 X RAY DECT (DISPOSABLE) ×1 IMPLANT
SPONGE LAP 4X18 X RAY DECT (DISPOSABLE) IMPLANT
SPONGE SURGIFOAM ABS GEL 100 (HEMOSTASIS) ×1 IMPLANT
SPONGE SURGIFOAM ABS GEL SZ50 (HEMOSTASIS) ×1 IMPLANT
STAPLER SKIN PROX WIDE 3.9 (STAPLE) ×1 IMPLANT
STRIP CLOSURE SKIN 1/4X4 (GAUZE/BANDAGES/DRESSINGS) IMPLANT
SUT ETHILON 3 0 FSL (SUTURE) ×1 IMPLANT
SUT NURALON 4 0 TR CR/8 (SUTURE) ×3 IMPLANT
SUT PROLENE 6 0 BV (SUTURE) ×6 IMPLANT
SUT VIC AB 1 CT1 18XBRD ANBCTR (SUTURE) ×1 IMPLANT
SUT VIC AB 1 CT1 8-18 (SUTURE) ×4
SUT VIC AB 2-0 CP2 18 (SUTURE) ×3 IMPLANT
SUT VIC AB 3-0 SH 8-18 (SUTURE) ×2 IMPLANT
SYR CONTROL 10ML LL (SYRINGE) ×2 IMPLANT
TOWEL GREEN STERILE (TOWEL DISPOSABLE) ×2 IMPLANT
TOWEL GREEN STERILE FF (TOWEL DISPOSABLE) ×1 IMPLANT
TRAY FOLEY W/METER SILVER 16FR (SET/KITS/TRAYS/PACK) ×1 IMPLANT
UNDERPAD 30X30 (UNDERPADS AND DIAPERS) IMPLANT
WATER STERILE IRR 1000ML POUR (IV SOLUTION) ×2 IMPLANT

## 2016-11-06 NOTE — Anesthesia Procedure Notes (Signed)
Procedure Name: Intubation Date/Time: 11/06/2016 12:39 PM Performed by: Julieta Bellini Pre-anesthesia Checklist: Patient identified, Emergency Drugs available, Suction available and Patient being monitored Patient Re-evaluated:Patient Re-evaluated prior to induction Oxygen Delivery Method: Circle system utilized Preoxygenation: Pre-oxygenation with 100% oxygen Induction Type: IV induction Ventilation: Mask ventilation without difficulty Laryngoscope Size: Mac and 4 Grade View: Grade I Tube type: Oral Tube size: 7.0 mm Number of attempts: 1 Airway Equipment and Method: Stylet Placement Confirmation: ETT inserted through vocal cords under direct vision,  positive ETCO2 and breath sounds checked- equal and bilateral Secured at: 22 cm Tube secured with: Tape Dental Injury: Teeth and Oropharynx as per pre-operative assessment

## 2016-11-06 NOTE — Anesthesia Procedure Notes (Signed)
Arterial Line Insertion Start/End9/11/2016 11:45 AM Performed by: Josephine Igo, CRNA  Patient location: Pre-op. Preanesthetic checklist: patient identified, IV checked, risks and benefits discussed and surgical consent Lidocaine 1% used for infiltration and patient sedated Left, radial was placed Catheter size: 20 G Hand hygiene performed  and maximum sterile barriers used   Attempts: 1 Procedure performed without using ultrasound guided technique. Following insertion, dressing applied and Biopatch. Post procedure assessment: normal  Patient tolerated the procedure well with no immediate complications.

## 2016-11-06 NOTE — Op Note (Signed)
Date of surgery: 11/06/2016 Preoperative diagnosis: Chiari I malformation Postoperative diagnosis: Same Procedure: Suboccipital craniectomy with decompression of Chiari malformation placement of duraplasty with operating microscope and microdissection technique. Surgeon: Kristeen Miss First assistant: Deri Fuelling M.D. Anesthesia: Gen. endotracheal  Indications: The patient is a 41 year old female whose had several years of progressively worsening headaches. She has evolved a Chiari I malformation from a minimal protrusion of 3 mm to now 7 mm though this is still small she has been having increasing symptoms. And a recent MRI demonstrates the presence of dilatation of the central canal in the cervical spinal cord. She's been advised regarding surgery.  Procedure: Patient was brought to the operating room supine on a stretcher. After the smooth induction of general endotracheal anesthesia and the placement of appropriate central venous and arterial monitoring lines and the Foley catheter she was carefully turned prone. The head was secured in the 3 pin headrest. The back of the neck was shaved prepped with alcohol DuraPrep and draped in a sterile fashion. Midline incision was made at the craniocervical junction and this was carefully dissected down to the cervical dorsal fascia which was opened  midline down to the arch of C2. The ring of C1 was identified and this was carefully dissected using a low strength in a monopolar cautery. Suboccipital region was dissected and the fascia between the occiput and C1 was dissected also. Then with these tissues being isolated in a self-retaining retractor being put in place laminar arch of C1 was removed. A suboccipital craniectomy was created removing the inferior margin of the occiput and taking a 2 and 3 mm Kerrison punch to remove bone and enlarged craniectomy. There is noted to be a thickened fibrotic tissue at the base of the skull in the dura and this could  carefully be dissected in the midline. With this then the dural edges were identified and a durotomy was created in the midline extending this inferiorly approximately 1/2 cm onto the spinal canal it was noted proximally that this formed a very abrupt angulation in the occiput and opening the dura here yielded puncture in the arachnoid and release of substantial spinal fluid. The dura was immediately relaxed as it was opened more cranially. Some bleeding laterally was controlled on the dural leaves with bipolar cautery. Then a patch was created from duraplast substitute this measured 2 cm in length and 1/2 cm in width. It isn't sewn with the use of the operating microscope in a circumferential fashion around the dural opening the dural patch was cut in the shape of the diamond. A singular running 6-0 Prolene was used although several 6-0 Prolene 3 used to tent the dura to allow placement of this patch. With this hemostasis was carefully checked and ensured and when verified the retractors were removed the microscope was removed and the cervical dorsal fascia was closed in layers with #1 Vicryl initially in 2-0 Vicryl subcutaneously and 3-0 Vicryl subcuticularly. Blood loss for the procedure was estimated at approximately 100 mL. Patient tolerated procedure well. The 3 pin headrest turned supine return to the recovery room.

## 2016-11-06 NOTE — Progress Notes (Signed)
Awakening slowly Moving all extremities Awaiting further alertness.

## 2016-11-06 NOTE — Progress Notes (Signed)
Anesthesia Chart Review: Patient is a 41 year old female scheduled for suboccipital decompression for Chiari malformation on 11/06/16 by Dr. Kristeen Miss.    History includes smoking, Von Willebrand disease (Type 1 von Willebrands disorder congenital versus acquired related to anti-seizure medication), seizure disorder, asthma, hysterectomy '14, anxiety, depression, endometriosis, nasal sinus surgery, tonsillectomy. BMI is consistent with obesity.   - PCP is listed as Chana Bode, PA-C. - Hematologist is Dr. [name]. According to his note on 11/01/16, "It was never clear whether her von Willebrand's disorder was congenital or acquired. I first saw her when she was about 41 years old. Since that time she has been covered for minor surgical procedures including a tonsillectomy at age 56 and a laparoscopic hysterectomy without oophorectomy in 2014, with DDAVP and had no excessive bleeding.Marland KitchenMarland KitchenIn view of need for major intracranial surgery, she will need to be covered with clotting concentrate. We cannot rely on DDAVP. The preferred product is Humate-P which contains both factor VIII and von Willebrand factor. Although there is now a purified recombinant von Willebrand factor, this product works bested given with concomitant factor VIII. Recommendation: Based on current weight 89 kg an approximate half-life of Humate-P of 8-12 hours, I will order Humate-P 50 units per kilogram every 8 hours for the first 24 hours, every 12 hours for the next 48-hour's, then every 24 hours to complete 7 days total..Marland KitchenI will get a baseline von Willebrand profile today and then follow ristocetin cofactor activity perioperatively."  Meds include Advair, Depakote ER, Neurontin, Nasonex, ProAir, Zonegran.  BP 136/79   Pulse 98   Temp 36.7 C   Resp 18   Ht 5\' 3"  (1.6 m)   Wt 194 lb 11.2 oz (88.3 kg)   LMP 09/16/2012   SpO2 100%   BMI 34.49 kg/m   EKG 11/02/16: NSR.  Spirometry 09/28/15: FVC 2.21 (66%), FEV1 1.53  (56%), /fev1/////5 72 /886%), FEF 25-75% 1.11 (36%).   MRI Brain 07/17/16: IMPRESSION: Motion degraded examination. Chiari 1 malformation, otherwise negative noncontrast MRI of the Head.  Preoperative labs noted.   Hematology notes just brought for review this morning. I have notified Holding RN Lindsi about hematology orders. She will follow-up with the assigned anesthesiologist. (I was told Dr. Beryle Beams to be paged this morning to clarify timing of Humate-P.)  George Hugh [facility] Short Stay Center/Anesthesiology Phone [phone] 11/06/2016 10:12 AM

## 2016-11-06 NOTE — Anesthesia Preprocedure Evaluation (Addendum)
Anesthesia Evaluation  Patient identified by MRN, date of birth, ID band Patient awake    Reviewed: Allergy & Precautions, NPO status , Patient's Chart, lab work & pertinent test results  Airway Mallampati: II  TM Distance: >3 FB Neck ROM: Full    Dental  (+) Teeth Intact, Dental Advisory Given   Pulmonary asthma , Current Smoker,    breath sounds clear to auscultation       Cardiovascular negative cardio ROS   Rhythm:Regular Rate:Normal     Neuro/Psych  Headaches, Seizures -,  PSYCHIATRIC DISORDERS Anxiety Depression negative neurological ROS     GI/Hepatic negative GI ROS, Neg liver ROS,   Endo/Other  negative endocrine ROS  Renal/GU negative Renal ROS     Musculoskeletal negative musculoskeletal ROS (+) Arthritis ,   Abdominal (+) + obese,   Peds  Hematology  (+) Blood dyscrasia, ,   Anesthesia Other Findings vWD  Reproductive/Obstetrics                            Lab Results  Component Value Date   WBC 11.3 (H) 11/01/2016   HGB 12.9 11/01/2016   HCT 39.3 11/01/2016   MCV 89.7 11/01/2016   PLT 282 11/01/2016   Lab Results  Component Value Date   CREATININE 0.76 11/01/2016   BUN 13 11/01/2016   NA 141 11/01/2016   K 3.9 11/01/2016   CL 106 11/01/2016   CO2 26 11/01/2016   Lab Results  Component Value Date   INR 0.95 11/01/2016   INR 0.89 10/07/2010   EKG: normal sinus rhythm.  Anesthesia Physical Anesthesia Plan  ASA: III  Anesthesia Plan: General   Post-op Pain Management:    Induction: Intravenous  PONV Risk Score and Plan: 3 and Ondansetron, Dexamethasone, Midazolam and Treatment may vary due to age or medical condition  Airway Management Planned: Oral ETT  Additional Equipment: Arterial line  Intra-op Plan:   Post-operative Plan: Extubation in OR  Informed Consent: I have reviewed the patients History and Physical, chart, labs and discussed the  procedure including the risks, benefits and alternatives for the proposed anesthesia with the patient or authorized representative who has indicated his/her understanding and acceptance.   Dental advisory given and Dental Advisory Given  Plan Discussed with: CRNA, Anesthesiologist and Surgeon  Anesthesia Plan Comments: (Recommendations per Hematology:  Humate-P 50 units per kilogram every 8 hours for the first 24 hours, every 12 hours for the next 48-hour's, then every 24 hours to complete 7 days total.  Dr. Beryle Beams will follow ristocetin cofactor activity perioperatively.  )       Anesthesia Quick Evaluation

## 2016-11-06 NOTE — Anesthesia Procedure Notes (Signed)
Arterial Line Insertion Start/End9/11/2016 12:48 PM, 11/06/2016 12:53 PM Performed by: Suella Broad D, anesthesiologist  Patient location: Pre-op. Preanesthetic checklist: patient identified, IV checked, site marked, risks and benefits discussed, surgical consent, monitors and equipment checked, pre-op evaluation, timeout performed and anesthesia consent Lidocaine 1% used for infiltration Right, radial was placed Catheter size: 20 Fr Hand hygiene performed  and maximum sterile barriers used   Attempts: 1 Procedure performed without using ultrasound guided technique. Following insertion, dressing applied. Post procedure assessment: normal and unchanged

## 2016-11-06 NOTE — Progress Notes (Addendum)
Hematology:  41 year old woman well known to me.  Please see preoperative consult in chart dated November 01, 2016. She has a long-standing seizure disorder since childhood on multiple anticonvulsants.  Migraine headache.  Known Arnold Chiari malformation.  I follow her for type I von Willebrand's disorder.  She does well with good hemostasis using DDAVP to cover minor procedures.  She is now status post suboccipital craniectomy with decompression of Chiari malformation and placement of duraplasty.  No excessive bleeding encountered during the procedure. She was given a preop dose of 50 U/kg of Humate-P factor VIII/von Willebrand factor clotting concentrate.  Plan is to repeat dose every 8 hours for 3 doses, every 12 hours for 4 doses, then daily to complete 7 days. She is stable postop.  Still groggy from anesthetics.  Wound in the posterior neck is dry. She responds to commands and can squeeze my fingers with both hands.  Move both of her feet. Impression: Type I a von Willebrand's disorder Stable post craniectomy and repair of Chiari malformation Plan: I will continue to monitor her hematologic status over the next few days. For optimal administration of scheduled clotting factors, she needs to stay in the hospital until she completes the q. 8 hour and every 12 hours doses prior to discharge for daily dosing.     [name], MD, [place]  Hematology-Oncology/Internal Medicine [phone]

## 2016-11-06 NOTE — Transfer of Care (Signed)
Immediate Anesthesia Transfer of Care Note  Patient: Cassidy Tran  Procedure(s) Performed: Procedure(s): Suboccipital decompression for chiari malformation (N/A)  Patient Location: PACU  Anesthesia Type:General  Level of Consciousness: drowsy and patient cooperative  Airway & Oxygen Therapy: Patient Spontanous Breathing and Patient connected to face mask oxygen  Post-op Assessment: Report given to RN, Post -op Vital signs reviewed and stable and Patient moving all extremities X 4  Post vital signs: Reviewed and stable  Last Vitals:  Vitals:   11/06/16 1116  BP: (!) 158/74  Pulse: 97  Resp: 18  Temp: 36.6 C  SpO2: 98%    Last Pain:  Vitals:   11/06/16 1116  TempSrc: Oral  PainSc: 4       Patients Stated Pain Goal: 4 (75/10/25 8527)  Complications: No apparent anesthesia complications

## 2016-11-06 NOTE — H&P (Signed)
Date of admission: 11/06/2016 Chief complaint: Worsening headaches with nausea, episodes of diplopia.  History of present illness: Cassidy Tran is a 41 year old individual who has had significant problems with headaches for a number of years now several years ago she had an MRI that demonstrated minimum displacement of the cerebellar tonsils below the foramen magnum this measured approximately 3-4 mm. She had some intermittent headaches at the time with occasional episodes of diplopia. Subsequently she's had worsening headaches and a repeat MRI was performed a little over year ago. This demonstrated that she had tonsillar herniation measuring approximately 5 mm. Because of the complaints continuing to repeat MRI now demonstrates that she has 53mm tonsillar herniation she notes that the symptoms are incessant at the current time with severe and unrelenting headache occasional episodes of nausea vomiting addition to episodes of diplopia. I noted with her that the degree of tonsillar herniation is small and there is some slight evidence of dilatation of the upper cervical spine spinal central canal. Nonetheless the severity of her symptoms have brought her to the point where she would consider surgical decompression of the cerebellar tonsils and effort to gain some relief. She is admitted for a suboccipital decompression at this time.  Past medical history: She is generally been healthy. She did have a history of seizure disorder that occurred about 9 years ago after the flu vaccine. For that reason she takes Depakote 500 mg twice daily in addition to Zonegran 150 mg twice daily she has been using tramadol for headaches. She has a history of asthma and is a smoker she is some pro-air and Advair inhalers.  Social history: Patient is married,  Systems review: System reviews is notable for neck pain and leg pain anxiety double and blurred vision leg pain on walking nausea balance disturbance.  Previous  surgeries: Hysterectomy 2014 D&C in 2013 tonsillectomy before that.  Physical examination: Patient's head is normocephalic and nontraumatic extraocular movements are full face is symmetric to grimace tongue and uvular midline sclera and conjunctiva are clear range of motion of the neck reveals that she can turn 70 to either side she flexes and extends normally axial compression does not reproduce any symptoms is no tenderness or masses palpable in the supraclavicular fossa or along the sides of the neck. Station and gait are intact. Deep 10 reflexes are 2+ in the patellae and 2+ in the Achilles Babinskis are downgoing. Gen. physical exam reveals the heart has regular rate and rhythm the abdomen is soft bowel sounds positive no masses are noted lungs are clear to auscultation extremities feel no cyanosis clubbing or edema.  Impression: Patient has evidence of a very small Chiari malformation but there is evidence of dilatation of the central spinal canal and the cervical spine. This condition has been progressive being followed for several years now. After careful discussion we are now proceeding with suboccipital decompression and duraplasty for Chiari malformation.

## 2016-11-07 ENCOUNTER — Inpatient Hospital Stay (HOSPITAL_COMMUNITY): Payer: BLUE CROSS/BLUE SHIELD

## 2016-11-07 LAB — CBC WITH DIFFERENTIAL/PLATELET
BASOS ABS: 0 10*3/uL (ref 0.0–0.1)
Basophils Relative: 0 %
Eosinophils Absolute: 0 10*3/uL (ref 0.0–0.7)
Eosinophils Relative: 0 %
HCT: 40.6 % (ref 36.0–46.0)
HEMOGLOBIN: 12.9 g/dL (ref 12.0–15.0)
LYMPHS PCT: 5 %
Lymphs Abs: 1.5 10*3/uL (ref 0.7–4.0)
MCH: 29.5 pg (ref 26.0–34.0)
MCHC: 31.8 g/dL (ref 30.0–36.0)
MCV: 92.7 fL (ref 78.0–100.0)
MONO ABS: 1.7 10*3/uL — AB (ref 0.1–1.0)
Monocytes Relative: 6 %
NEUTROS ABS: 25.8 10*3/uL — AB (ref 1.7–7.7)
Neutrophils Relative %: 89 %
Platelets: 247 10*3/uL (ref 150–400)
RBC: 4.38 MIL/uL (ref 3.87–5.11)
RDW: 14.5 % (ref 11.5–15.5)
WBC: 29 10*3/uL — ABNORMAL HIGH (ref 4.0–10.5)

## 2016-11-07 LAB — GLUCOSE, CAPILLARY: Glucose-Capillary: 125 mg/dL — ABNORMAL HIGH (ref 65–99)

## 2016-11-07 LAB — APTT: aPTT: 27 seconds (ref 24–36)

## 2016-11-07 MED ORDER — FENTANYL CITRATE (PF) 100 MCG/2ML IJ SOLN
12.5000 ug | INTRAMUSCULAR | Status: DC | PRN
  Administered 2016-11-07 – 2016-11-08 (×3): 25 ug via INTRAVENOUS
  Filled 2016-11-07 (×3): qty 2

## 2016-11-07 MED ORDER — ANTIHEMOPHILIC FACTOR-VWF 250-600 UNITS IV SOLR
4317.0000 [IU] | INTRAVENOUS | Status: DC
  Administered 2016-11-10 – 2016-11-11 (×2): 4317 [IU] via INTRAVENOUS
  Filled 2016-11-07 (×3): qty 4317

## 2016-11-07 MED ORDER — WHITE PETROLATUM GEL
Status: AC
  Administered 2016-11-07: 15:00:00
  Filled 2016-11-07: qty 1

## 2016-11-07 MED ORDER — ANTIHEMOPHILIC FACTOR-VWF 250-600 UNITS IV SOLR
4283.0000 [IU] | Freq: Two times a day (BID) | INTRAVENOUS | Status: AC
  Administered 2016-11-07 – 2016-11-09 (×4): 4283 [IU] via INTRAVENOUS
  Filled 2016-11-07: qty 4283
  Filled 2016-11-07: qty 0
  Filled 2016-11-07 (×2): qty 4283

## 2016-11-07 MED ORDER — DEXAMETHASONE SODIUM PHOSPHATE 10 MG/ML IJ SOLN
INTRAMUSCULAR | Status: AC
  Filled 2016-11-07: qty 1

## 2016-11-07 MED ORDER — MANNITOL 25 % IV SOLN
INTRAVENOUS | Status: AC
  Filled 2016-11-07: qty 50

## 2016-11-07 MED ORDER — ATROPINE SULFATE 1 MG/10ML IJ SOSY
PREFILLED_SYRINGE | INTRAMUSCULAR | Status: AC
  Filled 2016-11-07: qty 10

## 2016-11-07 MED ORDER — DEXAMETHASONE SODIUM PHOSPHATE 10 MG/ML IJ SOLN
10.0000 mg | Freq: Once | INTRAMUSCULAR | Status: AC
Start: 2016-11-07 — End: 2016-11-07
  Administered 2016-11-07: 10 mg via INTRAVENOUS

## 2016-11-07 MED ORDER — MANNITOL 25 % IV SOLN
25.0000 g | Freq: Once | INTRAVENOUS | Status: AC
  Administered 2016-11-07: 25 g via INTRAVENOUS

## 2016-11-07 NOTE — Progress Notes (Signed)
Vital signs are stable Events of today noted and reviewed Clinically she seems to be waking up more this afternoon CT scan demonstrated some crowding around the fourth ventricle however MRI demonstrated no significant pathology no significant edema nor is there any evidence of a stroke MRV demonstrated no evidence of thrombus in the venous system.  I discussed with the patient and her family that we will just need to give this process some time to resolve

## 2016-11-07 NOTE — Care Management Note (Signed)
Case Management Note  Patient Details  Name: Cassidy Tran MRN: [id number] Date of Birth: Apr 18, 1975  Subjective/Objective:  Pt admitted on 11/06/16 s/p suboccipital decompression for chiari malformation.  PTA, pt independent, lives with spouse.                    Action/Plan: Pt with acute MS change this AM with significant bradycardia.  Will follow for discharge planning as pt progresses.    Expected Discharge Date:                  Expected Discharge Plan:     In-House Referral:     Discharge planning Services  CM Consult  Post Acute Care Choice:    Choice offered to:     DME Arranged:    DME Agency:     HH Arranged:    HH Agency:     Status of Service:  In process, will continue to follow  If discussed at Long Length of Stay Meetings, dates discussed:    Additional Comments:  Ella Bodo, RN 11/07/2016, 5:12 PM

## 2016-11-07 NOTE — Anesthesia Postprocedure Evaluation (Signed)
Anesthesia Post Note  Patient: Kendra Grissett  Procedure(s) Performed: Procedure(s) (LRB): Suboccipital decompression for chiari malformation (N/A)     Patient location during evaluation: PACU Anesthesia Type: General Level of consciousness: awake and patient cooperative Pain management: pain level controlled Vital Signs Assessment: post-procedure vital signs reviewed and stable Respiratory status: spontaneous breathing, nonlabored ventilation, respiratory function stable and patient connected to nasal cannula oxygen Cardiovascular status: blood pressure returned to baseline and stable Postop Assessment: no signs of nausea or vomiting Anesthetic complications: no    Last Vitals:  Vitals:   11/07/16 0600 11/07/16 0700  BP: 122/71 127/65  Pulse: 62 66  Resp: 15 (!) 9  Temp:    SpO2: 96% 97%    Last Pain:  Vitals:   11/07/16 0400  TempSrc: Oral  PainSc:                  Tamecia Mcdougald

## 2016-11-07 NOTE — Progress Notes (Signed)
Hematology: Acute change in neuro status with headache and slurred speech.  CT shows edema in the posterior fossa.  No hemorrhage or infarct.  MRI done.  Results pending. Currently receiving scheduled clotting factor concentrate.  Pro time in low normal range at 27 seconds.  Hemoglobin stable compared with preop at 12.9 g. We will continue scheduled Humate-P.  Monitor CBC and clotting times.   [name], MD, [place]  Hematology-Oncology/Internal Medicine [phone]

## 2016-11-08 ENCOUNTER — Encounter (HOSPITAL_COMMUNITY): Payer: Self-pay | Admitting: Neurological Surgery

## 2016-11-08 LAB — CBC WITH DIFFERENTIAL/PLATELET
BASOS ABS: 0 10*3/uL (ref 0.0–0.1)
Basophils Relative: 0 %
Eosinophils Absolute: 0 10*3/uL (ref 0.0–0.7)
Eosinophils Relative: 0 %
HCT: 37.9 % (ref 36.0–46.0)
Hemoglobin: 12.5 g/dL (ref 12.0–15.0)
LYMPHS ABS: 1.4 10*3/uL (ref 0.7–4.0)
Lymphocytes Relative: 5 %
MCH: 30.4 pg (ref 26.0–34.0)
MCHC: 33 g/dL (ref 30.0–36.0)
MCV: 92.2 fL (ref 78.0–100.0)
Monocytes Absolute: 2 10*3/uL — ABNORMAL HIGH (ref 0.1–1.0)
Monocytes Relative: 7 %
NEUTROS ABS: 24.5 10*3/uL — AB (ref 1.7–7.7)
Neutrophils Relative %: 88 %
Platelets: 194 10*3/uL (ref 150–400)
RBC: 4.11 MIL/uL (ref 3.87–5.11)
RDW: 15.1 % (ref 11.5–15.5)
WBC: 27.9 10*3/uL — AB (ref 4.0–10.5)

## 2016-11-08 LAB — APTT: aPTT: 30 seconds (ref 24–36)

## 2016-11-08 MED ORDER — DEXAMETHASONE SODIUM PHOSPHATE 10 MG/ML IJ SOLN
4.0000 mg | Freq: Two times a day (BID) | INTRAMUSCULAR | Status: DC
  Administered 2016-11-08 – 2016-11-12 (×9): 4 mg via INTRAVENOUS
  Filled 2016-11-08 (×10): qty 1

## 2016-11-08 MED ORDER — BUTALBITAL-APAP-CAFFEINE 50-325-40 MG PO TABS
1.0000 | ORAL_TABLET | ORAL | Status: DC | PRN
  Administered 2016-11-08 – 2016-11-13 (×13): 1 via ORAL
  Filled 2016-11-08 (×13): qty 1

## 2016-11-08 MED FILL — Thrombin For Soln 5000 Unit: CUTANEOUS | Qty: 5000 | Status: AC

## 2016-11-08 MED FILL — Gelatin Absorbable MT Powder: OROMUCOSAL | Qty: 1 | Status: AC

## 2016-11-08 NOTE — Progress Notes (Signed)
Vital signs are stable Motor function is intact Patient is still somnolent We'll add some Decadron today Continues to be monitored in ICU

## 2016-11-08 NOTE — Progress Notes (Signed)
Hematology: Wound dressing dry Hemoglobin stable at 12.5.  Pro time in normal range at 30 seconds.  Factor VIII level pending. Lethargic.  Persistent dysarthria.  Following all commands.  No gross motor deficits. White count up secondary to bolus IV dexamethasone given yesterday.  Afebrile. Impression: Postop day 2 repair Cassidy Tran malformation Type I a von Willebrand's disorder Stable from hematology point of view.  Continue scheduled factor concentrate replacement.

## 2016-11-09 ENCOUNTER — Inpatient Hospital Stay (HOSPITAL_COMMUNITY): Payer: BLUE CROSS/BLUE SHIELD

## 2016-11-09 LAB — APTT: aPTT: 25 seconds (ref 24–36)

## 2016-11-09 MED FILL — Antihemophilic Factor/VWF (Human) For Inj 500-1200 Unit: INTRAVENOUS | Qty: 3600 | Status: AC

## 2016-11-09 MED FILL — Antihemophilic Factor/VWF (Human) For Inj 250-600 Unit: INTRAVENOUS | Qty: 1883 | Status: AC

## 2016-11-09 MED FILL — Antihemophilic Factor/VWF (Human) For Inj 500-1200 Unit: INTRAVENOUS | Qty: 2400 | Status: AC

## 2016-11-09 MED FILL — Antihemophilic Factor/VWF (Human) For Inj 250-600 Unit: INTRAVENOUS | Qty: 648 | Status: AC

## 2016-11-09 NOTE — Progress Notes (Signed)
Hematology: PTT remains in normal range at 25 seconds.  Factor VIII level drawn yesterday greater than 182%.  Neck wound dressing remains dry. Less lethargic today.  Following all commands appropriately. Impression: 1.  Postop day 4 repair of Chiari malformation 2.  Type I a von Willebrand disorder I am tapering her Humate-P from initial every 8 hours, then every 12 hours, and starting today every 24 hours to complete a 7 day course.  Dose may need to be increased if she needs another surgical procedure.  [name], MD, [place]  Hematology-Oncology/Internal Medicine [phone]

## 2016-11-09 NOTE — Progress Notes (Signed)
Vital signs are stable Patient still remains rather lethargic Will obtain new CT of the head We'll also discontinue IV Dilaudid Patient did well with Fioricet for pain

## 2016-11-10 LAB — FACTOR VIII (NCBH)

## 2016-11-10 NOTE — Progress Notes (Signed)
New Admission Note:  Arrival Method: arrived in bed Mental Orientation:alert & oriented x4  Telemetry: Assessment: Complete IV:R forearm/saline locked Pain:0/10 Safety Measures: Safety Fall Prevention Plan was given, discussed. Admission: Completed 5M03: Patient has been orientated to the room, unit and the staff. Family:visiting   Orders have been reviewed and implemented. Will continue to monitor the patient. Call light has been placed within reach and bed alarm has been activated.   Arta Silence ,RN

## 2016-11-10 NOTE — Progress Notes (Addendum)
Hematology: Now fully alert. No focal deficits. F/U CT brain stable. Wound dressing remains dry. Scattered wheezes lungs. No edema/calf tenderness. Factor VIII level 318% 9/13 reflecting Q 12 hr dosing. Now transitioning to QD dosing. Impression: 1. P.O.d 4 decompression Arnold Chiari malformation 2.Type Ia Von Willebrand's disorder  Rec: she needs to complete 7 d planned course of factor replacement in the hospital. Last dose should be Sunday.  Family here. Status reviewed.  [name], MD, [place]  Hematology-Oncology/Internal Medicine [phone]

## 2016-11-11 LAB — CBC WITH DIFFERENTIAL/PLATELET
Basophils Absolute: 0 10*3/uL (ref 0.0–0.1)
Basophils Relative: 0 %
Eosinophils Absolute: 0 10*3/uL (ref 0.0–0.7)
Eosinophils Relative: 0 %
HEMATOCRIT: 36.3 % (ref 36.0–46.0)
HEMOGLOBIN: 11.7 g/dL — AB (ref 12.0–15.0)
LYMPHS ABS: 1.9 10*3/uL (ref 0.7–4.0)
LYMPHS PCT: 11 %
MCH: 28.7 pg (ref 26.0–34.0)
MCHC: 32.2 g/dL (ref 30.0–36.0)
MCV: 89.2 fL (ref 78.0–100.0)
Monocytes Absolute: 0.7 10*3/uL (ref 0.1–1.0)
Monocytes Relative: 4 %
NEUTROS ABS: 14.6 10*3/uL — AB (ref 1.7–7.7)
NEUTROS PCT: 85 %
Platelets: 252 10*3/uL (ref 150–400)
RBC: 4.07 MIL/uL (ref 3.87–5.11)
RDW: 14.4 % (ref 11.5–15.5)
WBC: 17.2 10*3/uL — AB (ref 4.0–10.5)

## 2016-11-11 LAB — FACTOR VIII (NCBH)

## 2016-11-11 LAB — APTT: aPTT: 25 seconds (ref 24–36)

## 2016-11-11 NOTE — Progress Notes (Signed)
Hematology: PTT remains in normal range. Factor VIII level today high therapeutic. No signs of bleeding. Hb stable. Impression: Type IA Von Willebrand's disorder post op day 5 decompression Chiari malformation. Day 6 of 7 planned clotting factor replacement.

## 2016-11-11 NOTE — Progress Notes (Signed)
patient walked 50 feet ; twice today. Patient tolerated well. Well continue to monitor.

## 2016-11-11 NOTE — Progress Notes (Signed)
Level of alertness continues to improve Patient is ambulating short distances today She does appear to be somewhat unsteady on her feet Continues to receive her Humate-P injections. Plan discharge for Monday

## 2016-11-12 ENCOUNTER — Encounter (HOSPITAL_COMMUNITY): Payer: Self-pay | Admitting: General Practice

## 2016-11-12 MED ORDER — PANTOPRAZOLE SODIUM 40 MG PO TBEC
40.0000 mg | DELAYED_RELEASE_TABLET | Freq: Every day | ORAL | Status: DC
  Administered 2016-11-12: 40 mg via ORAL
  Filled 2016-11-12: qty 1

## 2016-11-12 MED ORDER — ANTIHEMOPHILIC FACTOR-VWF 250-600 UNITS IV SOLR
4283.0000 [IU] | Freq: Once | INTRAVENOUS | Status: AC
  Administered 2016-11-12: 4283 [IU] via INTRAVENOUS
  Filled 2016-11-12: qty 4283

## 2016-11-12 NOTE — Progress Notes (Signed)
Patient able to move around in room with walker and to bathroom independently, husband in room he is very attentive, will continue to monitor.

## 2016-11-12 NOTE — Progress Notes (Signed)
Pt. Walked from room  to nurses station and back without a walker.

## 2016-11-12 NOTE — Progress Notes (Signed)
Patient looks good. Complains of no headache. No new neurologic symptoms. Currently undergoing medication adjustments prior for discharge home. Plan for discharge tomorrow.  Afebrile. Vitals are stable. Wound clean and dry. Neurologically intact.  Continue current management. Likely discharge home tomorrow per Dr. Ellene Route.

## 2016-11-12 NOTE — Progress Notes (Signed)
Hematology: Neuro exam non focal Wound dressing dry No new lab Imp: Von Willebrand's type IA. Day 6 post decompression Chiari malformation Stable Last scheduled factor replacement today.

## 2016-11-13 LAB — APTT: APTT: 26 s (ref 24–36)

## 2016-11-13 MED ORDER — BUTALBITAL-APAP-CAFFEINE 50-325-40 MG PO TABS: 1.0000 | ORAL_TABLET | ORAL | 0 refills | Status: DC | PRN

## 2016-11-13 MED ORDER — DEXAMETHASONE 1 MG PO TABS: ORAL_TABLET | ORAL | 0 refills | Status: AC

## 2016-11-13 NOTE — Discharge Summary (Signed)
Date of admission: 11/06/2016 Date of discharge: 11/13/2016 Admitting diagnosis: Chiari I malformation Discharge and final diagnosis: Chiari I malformation, von Willebrand syndrome, postoperative lethargy. Condition on discharge: Improved Hospital course: The patient was admitted to undergo post anterior fossa decompression for Chiari I malformation. She tolerated surgery well however she was somewhat lethargic in the postoperative period. Because of history of von Willebrand's disease she was scanned to rule out the potential for bleeding. None was found. She is maintained in the hospital on high-dose steroids and seemed to improve gradually. She is complete her treatment for von Willebrand syndrome. Is no evidence of any postoperative hemorrhage. She is discharged home.  Discharge medications: Fioricet, #30. Decadron, #15 no refills.

## 2016-11-13 NOTE — Care Management Note (Signed)
Case Management Note  Patient Details  Name: Cassidy Tran MRN: [id number] Date of Birth: 02/12/1976  Subjective/Objective:                    Action/Plan: Pt discharging home with self care. Pt is from home with her spouse. Pt has PCP, insurance and transportation home. No further needs per CM.   Expected Discharge Date:  11/13/16               Expected Discharge Plan:  Home/Self Care  In-House Referral:     Discharge planning Services  CM Consult  Post Acute Care Choice:    Choice offered to:     DME Arranged:    DME Agency:     HH Arranged:    HH Agency:     Status of Service:  Completed, signed off  If discussed at [employer] of Stay Meetings, dates discussed:    Additional Comments:  Pollie Friar, RN 11/13/2016, 11:24 AM

## 2016-11-13 NOTE — Progress Notes (Signed)
Hematology: She continue to do well. PTT 26 secs Exam stable WBC was 17,000 9/15 from decadron - still listed as active med Discussed w Dr Ellene Route - likely home today. Impression d7 post Chiari decompression Type IA Von Willebrand's disorder Stable post Factor concentrate replacement

## 2016-11-13 NOTE — Progress Notes (Signed)
Patient IV removed. Discharge instructions given, questions answered.  Patient d/c'd to car via wheelchair at this time.

## 2016-11-14 LAB — MISCELLANEOUS TEST

## 2016-11-14 LAB — FACTOR VIII (NCBH)

## 2016-11-14 NOTE — Progress Notes (Signed)
Pt had several episodes this morning of bradycardia and being very difficult to arouse. Pt's heart rate in the 30's.  STAT CT ordered and done.  Post CT, Mannitol and decadron was pushed due to concerning swelling.  Pt retaining urine at this time.  Bladder scan revealed >500cc.  Dr. Kathyrn Sheriff at bedside stated to replace foley due to pt's state at this time. STAT MRI then performed.  Pt now alert and answering questions appropriately.

## 2016-11-21 ENCOUNTER — Encounter (HOSPITAL_COMMUNITY): Payer: Self-pay | Admitting: Emergency Medicine

## 2016-11-21 ENCOUNTER — Observation Stay (HOSPITAL_COMMUNITY)
Admission: EM | Admit: 2016-11-21 | Discharge: 2016-11-22 | Disposition: A | Discharge status: Home / Self Care | Payer: BLUE CROSS/BLUE SHIELD | Attending: Neurological Surgery | Admitting: Neurological Surgery

## 2016-11-21 ENCOUNTER — Emergency Department (HOSPITAL_COMMUNITY): Payer: BLUE CROSS/BLUE SHIELD

## 2016-11-21 DIAGNOSIS — F17210 Nicotine dependence, cigarettes, uncomplicated: Secondary | ICD-10-CM | POA: Insufficient documentation

## 2016-11-21 DIAGNOSIS — G910 Communicating hydrocephalus: Principal | ICD-10-CM | POA: Insufficient documentation

## 2016-11-21 DIAGNOSIS — R51 Headache: Secondary | ICD-10-CM

## 2016-11-21 DIAGNOSIS — J45909 Unspecified asthma, uncomplicated: Secondary | ICD-10-CM | POA: Insufficient documentation

## 2016-11-21 DIAGNOSIS — R519 Headache, unspecified: Secondary | ICD-10-CM

## 2016-11-21 DIAGNOSIS — Z9889 Other specified postprocedural states: Secondary | ICD-10-CM | POA: Diagnosis not present

## 2016-11-21 DIAGNOSIS — D689 Coagulation defect, unspecified: Secondary | ICD-10-CM | POA: Diagnosis not present

## 2016-11-21 DIAGNOSIS — D680 Von Willebrand's disease: Secondary | ICD-10-CM | POA: Diagnosis not present

## 2016-11-21 DIAGNOSIS — Z79899 Other long term (current) drug therapy: Secondary | ICD-10-CM | POA: Diagnosis not present

## 2016-11-21 DIAGNOSIS — Z7951 Long term (current) use of inhaled steroids: Secondary | ICD-10-CM | POA: Diagnosis not present

## 2016-11-21 DIAGNOSIS — G9389 Other specified disorders of brain: Secondary | ICD-10-CM

## 2016-11-21 DIAGNOSIS — G935 Compression of brain: Secondary | ICD-10-CM | POA: Diagnosis not present

## 2016-11-21 DIAGNOSIS — G40909 Epilepsy, unspecified, not intractable, without status epilepticus: Secondary | ICD-10-CM | POA: Insufficient documentation

## 2016-11-21 MED ORDER — ONDANSETRON HCL 4 MG/2ML IJ SOLN: 4.0000 mg | Freq: Four times a day (QID) | INTRAMUSCULAR | Status: DC | PRN

## 2016-11-21 MED ORDER — SODIUM CHLORIDE 0.9 % IV BOLUS (SEPSIS)
500.0000 mL | Freq: Once | INTRAVENOUS | Status: AC
  Administered 2016-11-21: 500 mL via INTRAVENOUS

## 2016-11-21 MED ORDER — OXYCODONE-ACETAMINOPHEN 7.5-325 MG PO TABS
1.0000 | ORAL_TABLET | Freq: Four times a day (QID) | ORAL | Status: DC | PRN
  Administered 2016-11-22: 1 via ORAL
  Filled 2016-11-21: qty 1

## 2016-11-21 MED ORDER — MOMETASONE FURO-FORMOTEROL FUM 200-5 MCG/ACT IN AERO
2.0000 | INHALATION_SPRAY | Freq: Two times a day (BID) | RESPIRATORY_TRACT | Status: DC
  Administered 2016-11-22: 2 via RESPIRATORY_TRACT
  Filled 2016-11-21: qty 8.8

## 2016-11-21 MED ORDER — ACETAMINOPHEN 325 MG PO TABS: 650.0000 mg | ORAL_TABLET | Freq: Four times a day (QID) | ORAL | Status: DC | PRN

## 2016-11-21 MED ORDER — FLUTICASONE PROPIONATE 50 MCG/ACT NA SUSP
1.0000 | Freq: Every day | NASAL | Status: DC
  Administered 2016-11-22: 1 via NASAL
  Filled 2016-11-21: qty 16

## 2016-11-21 MED ORDER — ACETAMINOPHEN 500 MG PO TABS: 1000.0000 mg | ORAL_TABLET | Freq: Four times a day (QID) | ORAL | Status: DC | PRN

## 2016-11-21 MED ORDER — ZOLPIDEM TARTRATE 5 MG PO TABS
5.0000 mg | ORAL_TABLET | Freq: Every evening | ORAL | Status: DC | PRN
  Administered 2016-11-22: 5 mg via ORAL
  Filled 2016-11-21: qty 1

## 2016-11-21 MED ORDER — MORPHINE SULFATE (PF) 4 MG/ML IV SOLN
6.0000 mg | Freq: Once | INTRAVENOUS | Status: AC
  Administered 2016-11-21: 6 mg via INTRAVENOUS
  Filled 2016-11-21: qty 2

## 2016-11-21 MED ORDER — DEXAMETHASONE SODIUM PHOSPHATE 4 MG/ML IJ SOLN
4.0000 mg | Freq: Four times a day (QID) | INTRAMUSCULAR | Status: DC
  Administered 2016-11-22 (×3): 4 mg via INTRAVENOUS
  Filled 2016-11-21 (×3): qty 1

## 2016-11-21 MED ORDER — SODIUM CHLORIDE 0.9 % IV SOLN: 250.0000 mL | INTRAVENOUS | Status: DC | PRN

## 2016-11-21 MED ORDER — DIVALPROEX SODIUM ER 500 MG PO TB24
1000.0000 mg | ORAL_TABLET | Freq: Two times a day (BID) | ORAL | Status: DC
  Administered 2016-11-22: 1000 mg via ORAL
  Filled 2016-11-21 (×3): qty 2

## 2016-11-21 MED ORDER — MAGNESIUM CITRATE PO SOLN: 1.0000 | Freq: Once | ORAL | Status: DC | PRN

## 2016-11-21 MED ORDER — BISACODYL 5 MG PO TBEC: 5.0000 mg | DELAYED_RELEASE_TABLET | Freq: Every day | ORAL | Status: DC | PRN

## 2016-11-21 MED ORDER — SODIUM CHLORIDE 0.9% FLUSH: 3.0000 mL | INTRAVENOUS | Status: DC | PRN

## 2016-11-21 MED ORDER — ACETAMINOPHEN 650 MG RE SUPP: 650.0000 mg | Freq: Four times a day (QID) | RECTAL | Status: DC | PRN

## 2016-11-21 MED ORDER — HYDROMORPHONE HCL 1 MG/ML IJ SOLN: 1.0000 mg | INTRAMUSCULAR | Status: DC | PRN

## 2016-11-21 MED ORDER — BUTALBITAL-APAP-CAFFEINE 50-325-40 MG PO TABS: 1.0000 | ORAL_TABLET | ORAL | Status: DC | PRN

## 2016-11-21 MED ORDER — SODIUM CHLORIDE 0.9% FLUSH
3.0000 mL | Freq: Two times a day (BID) | INTRAVENOUS | Status: DC
  Administered 2016-11-22: 3 mL via INTRAVENOUS

## 2016-11-21 MED ORDER — ZONISAMIDE 100 MG PO CAPS
100.0000 mg | ORAL_CAPSULE | Freq: Two times a day (BID) | ORAL | Status: DC
  Administered 2016-11-22: 100 mg via ORAL
  Filled 2016-11-21 (×3): qty 1

## 2016-11-21 MED ORDER — SENNOSIDES-DOCUSATE SODIUM 8.6-50 MG PO TABS: 1.0000 | ORAL_TABLET | Freq: Every evening | ORAL | Status: DC | PRN

## 2016-11-21 MED ORDER — SODIUM CHLORIDE 0.9 % IV SOLN: INTRAVENOUS | Status: DC

## 2016-11-21 MED ORDER — ONDANSETRON HCL 4 MG PO TABS: 4.0000 mg | ORAL_TABLET | Freq: Four times a day (QID) | ORAL | Status: DC | PRN

## 2016-11-21 MED ORDER — HYDROCODONE-ACETAMINOPHEN 5-325 MG PO TABS
1.0000 | ORAL_TABLET | ORAL | Status: DC | PRN
  Administered 2016-11-22 (×2): 2 via ORAL
  Filled 2016-11-21 (×2): qty 2

## 2016-11-21 MED ORDER — DOCUSATE SODIUM 100 MG PO CAPS
100.0000 mg | ORAL_CAPSULE | Freq: Two times a day (BID) | ORAL | Status: DC
  Administered 2016-11-22 (×2): 100 mg via ORAL
  Filled 2016-11-21 (×2): qty 1

## 2016-11-21 MED ORDER — GABAPENTIN 300 MG PO CAPS
300.0000 mg | ORAL_CAPSULE | Freq: Two times a day (BID) | ORAL | Status: DC
  Administered 2016-11-22 (×2): 300 mg via ORAL
  Filled 2016-11-21 (×2): qty 1

## 2016-11-21 MED ORDER — ALBUTEROL SULFATE (2.5 MG/3ML) 0.083% IN NEBU: 3.0000 mL | INHALATION_SOLUTION | RESPIRATORY_TRACT | Status: DC | PRN

## 2016-11-21 NOTE — ED Provider Notes (Signed)
[facility] DEPT Provider Note   CSN: [id number] Arrival date & time: 11/21/16  2057     History   Chief Complaint Chief Complaint  Patient presents with  . Headache    HPI Venie Montesinos is a 41 y.o. female.  HPI patient is a 41 year old female status post Chiari malformation decompression by Dr. Ellene Route.  This was performed on September 10.  She presents the emergency department complaining of increasing pressure in her chest over the past 4-5 days.no fevers or chills.  Denies nausea vomiting.  She states her Decadron is being tapered at this time.   Past Medical History:  Diagnosis Date  . Allergy   . Anxiety   . Arthritis    lower back  . ASCUS (atypical squamous cells of undetermined significance) on Pap smear 03/2011   NEG HR HPV  . Asthma   . Cervical dysplasia, mild 08/2010   LGSIL colposcopy biopsy showing koilocytotic atypia  . Clotting disorder ([place])   . Depression   . Endometriosis   . Headache(784.0)   . High risk HPV infection 12/2011   Pap normal  . Seizures (HCC)    seizure disorder  . Smoker   . Von Willebrand disease [facility])     Patient Active Problem List   Diagnosis Date Noted  . Pressure in head 11/21/2016  . Chiari malformation type I ([place]) 11/06/2016  . Chiari I malformation ([place]) 11/06/2016  . Von Willebrand disease type IA ([place])   . Neck pain 08/08/2016  . Acute pain of right shoulder 08/08/2016  . Upper back pain on right side 08/08/2016  . Muscle spasm 08/08/2016  . Spinal stenosis in cervical region 08/08/2016  . Von Willebrand disease ([place]) 10/28/2010  . Asthmatic bronchitis 10/28/2010  . Seizure disorder ([place]) 10/28/2010  . Anxiety 10/28/2010  . Smoker 10/28/2010  . Arthritis 10/28/2010  . Headache(784.0) 10/28/2010  . Blood dyscrasia 10/28/2010    Past Surgical History:  Procedure Laterality Date  . BREAST BIOPSY Left   . COLPOSCOPY    . CYSTOSCOPY N/A 10/14/2012   Procedure: CYSTOSCOPY;  Surgeon: [name], MD;  Location: [facility];  Service: Gynecology;  Laterality: N/A;  . DILATION AND CURETTAGE OF UTERUS    . HYSTEROSCOPY W/D&C  10/14/2010   Procedure: DILATATION AND CURETTAGE (D&C) /HYSTEROSCOPY;  Surgeon: [name], MD;  Location: [facility];  Service: Gynecology;  Laterality: N/A;  . LAPAROSCOPIC HYSTERECTOMY N/A 10/14/2012   Procedure: HYSTERECTOMY TOTAL LAPAROSCOPIC;  Surgeon: [name], MD;  Location: [facility];  Service: Gynecology;  Laterality: N/A;  CPT [id number]  2 1/2 hours  Dr. Uvaldo Rising to assist.  . LAPAROSCOPY  04/27/2011   Procedure: LAPAROSCOPY OPERATIVE;  Surgeon: [name], MD;  Location: [facility];  Service: Gynecology;  Laterality: N/A;  removal right cyst , lysis of adhesions, biopsy of peritoneum  . [place], [place] CRANIECTOMY CERVICAL LAMINECTOMY N/A 11/06/2016   Procedure: Suboccipital decompression for chiari malformation;  Surgeon: Kristeen Miss, MD;  Location: [facility];  Service: Neurosurgery;  Laterality: N/A;  . TONSILLECTOMY    . VAGINAL HYSTERECTOMY    . WISDOM TOOTH EXTRACTION  1993    OB History    Gravida Para Term Preterm AB Living   0             SAB TAB Ectopic Multiple Live Births  Home Medications    Prior to Admission medications   Medication Sig Start Date End Date Taking? Authorizing Provider  acetaminophen (TYLENOL) 500 MG tablet Take 1,000 mg by mouth every 6 (six) hours as needed for mild pain or moderate pain. pain    Yes [provider]  ADVAIR HFA 115-21 MCG/ACT inhaler INHALE 2 PUFFS BY MOUTH EVERY 12 HOURS TO PREVENT COUGH OR WHEEZING 08/31/16  Yes Kozlow, Donnamarie Poag, MD  butalbital-acetaminophen-caffeine (FIORICET, ESGIC) [phone] MG tablet Take 1 tablet by mouth every 4 (four) hours as needed for headache. 11/13/16  Yes Kristeen Miss, MD  dexamethasone (DECADRON) 1 MG tablet Take 1 tablet by mouth daily. 11/16/16  Yes [provider]    divalproex (DEPAKOTE ER) 500 MG 24 hr tablet Take 1,000 mg by mouth 2 (two) times daily.   Yes [provider]  gabapentin (NEURONTIN) 300 MG capsule Take 300 mg by mouth 2 (two) times daily.  07/02/16  Yes [provider]  meclizine (ANTIVERT) 25 MG tablet TAKE 1 TABLET BY MOUTH THREE TIMES DAILY AS NEEDED FOR DIZZINESS 10/02/16  Yes Tysinger, Camelia Eng, PA-C  mometasone (NASONEX) 50 MCG/ACT nasal spray Use one to two sprays in each nostril once daily Patient taking differently: Place 2 sprays into the nose daily as needed (for allergies). Use one to two sprays in each nostril once daily 09/28/15  Yes Kozlow, Donnamarie Poag, MD  PROAIR HFA 108 [phone] Base) MCG/ACT inhaler INHALE 2 PUFFS BY MOUTH EVERY 4 TO 6 HOURS AS NEEDED FOR COUGH OR WHEEZING 10/02/16  Yes Kozlow, Donnamarie Poag, MD  zonisamide (ZONEGRAN) 100 MG capsule Take 100 mg by mouth 2 (two) times daily.    Yes [provider]    Family History Family History  Problem Relation Age of Onset  . Hypertension Mother   . Heart disease Mother   . Hyperlipidemia Mother   . Diabetes Father   . Hypertension Father   . Heart disease Father   . Hyperlipidemia Father   . Stroke Father   . Cancer Maternal Uncle        Lung and pancreatic cancer  . Lymphoma Sister        NON-HODGKINS LYMPHOMA    Social History Social History  Substance Use Topics  . Smoking status: Current Every Day Smoker    Packs/day: 0.50    Types: Cigarettes  . Smokeless tobacco: Never Used  . Alcohol use No     Allergies   Aspirin; Codeine; Erythromycin; Lactose intolerance (gi); Nsaids; and Penicillins   Review of Systems Review of Systems  All other systems reviewed and are negative.    Physical Exam Updated Vital Signs BP 138/61   Pulse 60   Temp 98.1 F (36.7 C) (Oral)   Resp 15   Ht 5\' 3"  (1.6 m)   Wt 88.5 kg (195 lb)   LMP 09/16/2012   SpO2 95%   BMI 34.54 kg/m   Physical Exam  Constitutional: She is oriented to person, place, and  time. She appears well-developed and well-nourished.  HENT:  Head: Normocephalic.  Eyes: EOM are normal.  Neck: Normal range of motion.  Cardiovascular: Normal rate.   Pulmonary/Chest: Effort normal.  Abdominal: She exhibits no distension.  Musculoskeletal: Normal range of motion.  Neurological: She is alert and oriented to person, place, and time.  Moves all 4 extremities equally  Psychiatric: She has a normal mood and affect.  Nursing note and vitals reviewed.    ED Treatments /  Results  Labs (all labs ordered are listed, but only abnormal results are displayed) Labs Reviewed  CBC  BASIC METABOLIC PANEL  HIV ANTIBODY (ROUTINE TESTING)    EKG  EKG Interpretation None       Radiology Ct Head Wo Contrast  Result Date: 11/21/2016 CLINICAL DATA:  41 y/o  F; head pressure after surgery. EXAM: CT HEAD WITHOUT CONTRAST TECHNIQUE: Contiguous axial images were obtained from the base of the skull through the vertex without intravenous contrast. COMPARISON:  11/09/2016 CT head. FINDINGS: Brain: Interval development of low-attenuation extra-axial collections over the cerebral convexities, along the falx, and overlying the cerebellar hemispheres. The collections measure up to 6 millimeters in thickness overlying the right frontal lobe and along the left frontal falx. 3 millimeters of right-to-left midline shift and sulcal effacement. No herniation. No evidence for acute hemorrhage, focal mass effect, or large acute stroke of the brain parenchyma. No hydrocephalus. Vascular: No hyperdense vessel or unexpected calcification. Skull: Postsurgical changes related to suboccipital craniectomy for Chiari malformation. Fluid collections within the posterior suboccipital soft tissues are partially visualized but increased in size from the prior CT, the largest visible collection measures 2.1 x 2.5 centimeters in the axial plane (series 3, image 1). Sinuses/Orbits: Mild ethmoid and maxillary sinus  mucosal thickening in chronic inflammatory changes of the walls of maxillary sinus. Normal aeration of mastoid air cells. Visible orbits are unremarkable. Other: None. IMPRESSION: 1. Interval development of low-attenuation extra-axial collections over the cerebral convexities, falx, and overlying cerebellar hemispheres measuring up to 6 millimeters. Slight asymmetry in the collections results and 3 millimeters of right-to-left midline shift. No high attenuation blood products identified. Findings probably represent hygroma. 2. Increased size of fluid collections within the posterior suboccipital soft tissues at site of suboccipital craniectomy, differential includes postoperative seroma versus pseudomeningocele. These results were called by telephone at the time of interpretation on 11/21/2016 at 10:10 pm to Dr. Jola Schmidt , who verbally acknowledged these results. Electronically Signed   By: Kristine Garbe M.D.   On: 11/21/2016 22:12    Procedures Procedures (including critical care time)  Medications Ordered in ED Medications  morphine 4 MG/ML injection 6 mg (not administered)  sodium chloride 0.9 % bolus 500 mL (not administered)  zonisamide (ZONEGRAN) capsule 100 mg (not administered)  acetaminophen (TYLENOL) tablet 1,000 mg (not administered)  fluticasone (FLONASE) 50 MCG/ACT nasal spray 1 spray (not administered)  gabapentin (NEURONTIN) capsule 300 mg (not administered)  divalproex (DEPAKOTE ER) 24 hr tablet 1,000 mg (not administered)  mometasone-formoterol (DULERA) 200-5 MCG/ACT inhaler 2 puff (not administered)  albuterol (PROVENTIL HFA;VENTOLIN HFA) 108 (90 Base) MCG/ACT inhaler 1-2 puff (not administered)  butalbital-acetaminophen-caffeine (FIORICET, ESGIC) 50-325-40 MG per tablet 1 tablet (not administered)  dexamethasone (DECADRON) injection 4 mg (not administered)  0.9 %  sodium chloride infusion (not administered)  sodium chloride flush (NS) 0.9 % injection 3 mL (not  administered)  sodium chloride flush (NS) 0.9 % injection 3 mL (not administered)  0.9 %  sodium chloride infusion (not administered)  acetaminophen (TYLENOL) tablet 650 mg (not administered)    Or  acetaminophen (TYLENOL) suppository 650 mg (not administered)  HYDROcodone-acetaminophen (NORCO/VICODIN) 5-325 MG per tablet 1-2 tablet (not administered)  zolpidem (AMBIEN) tablet 5 mg (not administered)  docusate sodium (COLACE) capsule 100 mg (not administered)  senna-docusate (Senokot-S) tablet 1 tablet (not administered)  bisacodyl (DULCOLAX) EC tablet 5 mg (not administered)  magnesium citrate solution 1 Bottle (not administered)  ondansetron (ZOFRAN) tablet 4 mg (not administered)  Or  ondansetron (ZOFRAN) injection 4 mg (not administered)  oxyCODONE-acetaminophen (PERCOCET) 7.5-325 MG per tablet 1 tablet (not administered)  HYDROmorphone (DILAUDID) injection 1 mg (not administered)     Initial Impression / Assessment and Plan / ED Course  I have reviewed the triage vital signs and the nursing notes.  Pertinent labs & imaging results that were available during my care of the patient were reviewed by me and considered in my medical decision making (see chart for details).     I discussed the case with neurosurgery who will admit the patient for symptom control.  Abnormal CT with increasing fluid collections likely leading to increasing headache and pressure sensation.  Pain control now  Final Clinical Impressions(s) / ED Diagnoses   Final diagnoses:  None    New Prescriptions New Prescriptions   No medications on file     Jola Schmidt, MD 11/21/16 2328

## 2016-11-21 NOTE — H&P (Signed)
Chief Complaint   Chief Complaint  Patient presents with  . Headache    HPI   HPI: Cassidy Tran is a 41 y.o. female who presented to ER due to increasing occipital head pressure over the past three days. S/P suboccipital craniectomy with decompression of chiari malformation by Dr Kristeen Miss 11/06/2016. Over the past three Head pressure is moderate in severity. Does not prevent her from accomplishing ADLs but is bothersome. Has been taking steroid taper as rx after discharge from hospital, gabapentin and tylenol without relief. Associated with intermittent dizziness. No nausea, vomiting, changes in vision, gait instability, falls, motor/sensory deficits. No concerns with surgical site.  Patient Active Problem List   Diagnosis Date Noted  . Pressure in head 11/21/2016  . Chiari malformation type I ([place]) 11/06/2016  . Chiari I malformation ([place]) 11/06/2016  . Von Willebrand disease type IA ([place])   . Neck pain 08/08/2016  . Acute pain of right shoulder 08/08/2016  . Upper back pain on right side 08/08/2016  . Muscle spasm 08/08/2016  . Spinal stenosis in cervical region 08/08/2016  . Von Willebrand disease ([place]) 10/28/2010  . Asthmatic bronchitis 10/28/2010  . Seizure disorder ([place]) 10/28/2010  . Anxiety 10/28/2010  . Smoker 10/28/2010  . Arthritis 10/28/2010  . Headache(784.0) 10/28/2010  . Blood dyscrasia 10/28/2010    PMH: Past Medical History:  Diagnosis Date  . Allergy   . Anxiety   . Arthritis    lower back  . ASCUS (atypical squamous cells of undetermined significance) on Pap smear 03/2011   NEG HR HPV  . Asthma   . Cervical dysplasia, mild 08/2010   LGSIL colposcopy biopsy showing koilocytotic atypia  . Clotting disorder ([place])   . Depression   . Endometriosis   . Headache(784.0)   . High risk HPV infection 12/2011   Pap normal  . Seizures (HCC)    seizure disorder  . Smoker   . Von Willebrand disease ([place])     PSH: Past Surgical History:  Procedure  Laterality Date  . BREAST BIOPSY Left   . COLPOSCOPY    . CYSTOSCOPY N/A 10/14/2012   Procedure: CYSTOSCOPY;  Surgeon: [name], MD;  Location: [facility];  Service: Gynecology;  Laterality: N/A;  . DILATION AND CURETTAGE OF UTERUS    . HYSTEROSCOPY W/D&C  10/14/2010   Procedure: DILATATION AND CURETTAGE (D&C) /HYSTEROSCOPY;  Surgeon: [name], MD;  Location: [facility];  Service: Gynecology;  Laterality: N/A;  . LAPAROSCOPIC HYSTERECTOMY N/A 10/14/2012   Procedure: HYSTERECTOMY TOTAL LAPAROSCOPIC;  Surgeon: [name], MD;  Location: [facility];  Service: Gynecology;  Laterality: N/A;  CPT [id number]  2 1/2 hours  Dr. Uvaldo Rising to assist.  . LAPAROSCOPY  04/27/2011   Procedure: LAPAROSCOPY OPERATIVE;  Surgeon: [name], MD;  Location: [facility];  Service: Gynecology;  Laterality: N/A;  removal right cyst , lysis of adhesions, biopsy of peritoneum  . [place], [place] CRANIECTOMY CERVICAL LAMINECTOMY N/A 11/06/2016   Procedure: Suboccipital decompression for chiari malformation;  Surgeon: Kristeen Miss, MD;  Location: [facility];  Service: Neurosurgery;  Laterality: N/A;  . TONSILLECTOMY    . VAGINAL HYSTERECTOMY    . [place]     (Not in a hospital admission)  SH: Social History  Substance Use Topics  . Smoking status: Current Every Day Smoker    Packs/day: 0.50    Types: Cigarettes  . Smokeless  tobacco: Never Used  . Alcohol use No    MEDS: Prior to Admission medications   Medication Sig Start Date End Date Taking? Authorizing Provider  acetaminophen (TYLENOL) 500 MG tablet Take 1,000 mg by mouth every 6 (six) hours as needed for mild pain or moderate pain. pain    Yes [provider]  ADVAIR HFA 115-21 MCG/ACT inhaler INHALE 2 PUFFS BY MOUTH EVERY 12 HOURS TO PREVENT COUGH OR WHEEZING 08/31/16  Yes Kozlow, Donnamarie Poag, MD  butalbital-acetaminophen-caffeine (FIORICET, ESGIC) [phone] MG tablet Take  1 tablet by mouth every 4 (four) hours as needed for headache. 11/13/16  Yes Kristeen Miss, MD  dexamethasone (DECADRON) 1 MG tablet Take 1 tablet by mouth daily. 11/16/16  Yes [provider]  divalproex (DEPAKOTE ER) 500 MG 24 hr tablet Take 1,000 mg by mouth 2 (two) times daily.   Yes [provider]  gabapentin (NEURONTIN) 300 MG capsule Take 300 mg by mouth 2 (two) times daily.  07/02/16  Yes [provider]  meclizine (ANTIVERT) 25 MG tablet TAKE 1 TABLET BY MOUTH THREE TIMES DAILY AS NEEDED FOR DIZZINESS 10/02/16  Yes Tysinger, Camelia Eng, PA-C  mometasone (NASONEX) 50 MCG/ACT nasal spray Use one to two sprays in each nostril once daily Patient taking differently: Place 2 sprays into the nose daily as needed (for allergies). Use one to two sprays in each nostril once daily 09/28/15  Yes Kozlow, Donnamarie Poag, MD  PROAIR HFA 108 [phone] Base) MCG/ACT inhaler INHALE 2 PUFFS BY MOUTH EVERY 4 TO 6 HOURS AS NEEDED FOR COUGH OR WHEEZING 10/02/16  Yes Kozlow, Donnamarie Poag, MD  zonisamide (ZONEGRAN) 100 MG capsule Take 100 mg by mouth 2 (two) times daily.    Yes [provider]    ALLERGY: Allergies  Allergen Reactions  . Aspirin Other (See Comments)    Childhood reaction  . Codeine Nausea And Vomiting    Tolerates Hydrocodone  . Erythromycin Other (See Comments)    Childhood reaction, tolerate Zpak  . Lactose Intolerance (Gi) Diarrhea  . Nsaids Nausea And Vomiting  . Penicillins Other (See Comments)    Has patient had a PCN reaction causing immediate rash, facial/tongue/throat swelling, SOB or lightheadedness with hypotension: doesn't remember childhood Reaction  Has patient had a PCN reaction causing severe rash involving mucus membranes or skin necrosis: NO Has patient had a PCN reaction that required hospitalization NO Has patient had a PCN reaction occurring within the last 10 years: NO If all of the above answers are "NO", then may proceed with Cephalosporin use.     Social  History  Substance Use Topics  . Smoking status: Current Every Day Smoker    Packs/day: 0.50    Types: Cigarettes  . Smokeless tobacco: Never Used  . Alcohol use No     Family History  Problem Relation Age of Onset  . Hypertension Mother   . Heart disease Mother   . Hyperlipidemia Mother   . Diabetes Father   . Hypertension Father   . Heart disease Father   . Hyperlipidemia Father   . Stroke Father   . Cancer Maternal Uncle        Lung and pancreatic cancer  . Lymphoma Sister        NON-HODGKINS LYMPHOMA     ROS   Review of Systems  Constitutional: Negative for chills and fever.  HENT: Negative.   Eyes: Negative for blurred vision, double vision and photophobia.  Respiratory: Negative.   Cardiovascular: Negative.  Gastrointestinal: Negative for nausea and vomiting.  Genitourinary: Negative.   Musculoskeletal: Negative for back pain, falls, myalgias and neck pain.  Neurological: Positive for dizziness and headaches (more "pressure" than pain). Negative for tingling, tremors, sensory change, speech change, focal weakness, seizures and loss of consciousness.    Exam   Vitals:   11/21/16 2245 11/21/16 2300  BP: (!) 115/58 138/61  Pulse: (!) 56 60  Resp: 15   Temp: 98.1 F (36.7 C)   SpO2: 95% 95%   General appearance: WDWN, soft spoken, resting comfortably with lights off in room, non-toxic in appearance Eyes: PERRL, Fundoscopic: normal Cardiovascular: Regular rate and rhythm without murmurs, rubs, gallops. No edema or variciosities. Distal pulses normal. Pulmonary: Clear to auscultation Musculoskeletal:     Muscle tone upper extremities: Normal    Muscle tone lower extremities: Normal    Motor exam: Upper Extremities Deltoid Bicep Tricep Grip  Right 5/5 5/5 5/5 5/5  Left 5/5 5/5 5/5 5/5   Lower Extremity IP Quad PF DF EHL  Right 5/5 5/5 5/5 5/5 5/5  Left 5/5 5/5 5/5 5/5 5/5  Surgical site: Healing well. Minimal dry, scaling skin on edge of surgical site.  No warmth, tenderness, fluctuance.  Neurological Awake, alert, oriented Memory and concentration grossly intact Speech fluent, appropriate CNII: Visual fields normal CNIII/IV/VI: EOMI CNV: Facial sensation normal CNVII: Symmetric, normal strength CNVIII: Grossly normal CNIX: Normal palate movement CNXI: Trap and SCM strength normal CN XII: Tongue protrusion normal Sensation grossly intact to LT DTR: Normal No pronator drift. Coordination (finger/nose & heel/shin): Normal  Results - Imaging/Labs   No results found for this or any previous visit (from the past 48 hour(s)).  Ct Head Wo Contrast  Result Date: 11/21/2016 CLINICAL DATA:  41 y/o  F; head pressure after surgery. EXAM: CT HEAD WITHOUT CONTRAST TECHNIQUE: Contiguous axial images were obtained from the base of the skull through the vertex without intravenous contrast. COMPARISON:  11/09/2016 CT head. FINDINGS: Brain: Interval development of low-attenuation extra-axial collections over the cerebral convexities, along the falx, and overlying the cerebellar hemispheres. The collections measure up to 6 millimeters in thickness overlying the right frontal lobe and along the left frontal falx. 3 millimeters of right-to-left midline shift and sulcal effacement. No herniation. No evidence for acute hemorrhage, focal mass effect, or large acute stroke of the brain parenchyma. No hydrocephalus. Vascular: No hyperdense vessel or unexpected calcification. Skull: Postsurgical changes related to suboccipital craniectomy for Chiari malformation. Fluid collections within the posterior suboccipital soft tissues are partially visualized but increased in size from the prior CT, the largest visible collection measures 2.1 x 2.5 centimeters in the axial plane (series 3, image 1). Sinuses/Orbits: Mild ethmoid and maxillary sinus mucosal thickening in chronic inflammatory changes of the walls of maxillary sinus. Normal aeration of mastoid air cells. Visible  orbits are unremarkable. Other: None. IMPRESSION: 1. Interval development of low-attenuation extra-axial collections over the cerebral convexities, falx, and overlying cerebellar hemispheres measuring up to 6 millimeters. Slight asymmetry in the collections results and 3 millimeters of right-to-left midline shift. No high attenuation blood products identified. Findings probably represent hygroma. 2. Increased size of fluid collections within the posterior suboccipital soft tissues at site of suboccipital craniectomy, differential includes postoperative seroma versus pseudomeningocele. These results were called by telephone at the time of interpretation on 11/21/2016 at 10:10 pm to Dr. Jola Schmidt , who verbally acknowledged these results. Electronically Signed   By: Kristine Garbe M.D.   On: 11/21/2016 22:12  Impression/Plan   41 y.o. female with worsening head pressure s/p craniectomy for decompression of chiari malformation by Dr Kristeen Miss 11/06/2016. She is neurologically intact. CT head shows extra axial collections, likely hygromas as well as increase in fluid collections at the site of craniectomy which likely represents seroma. No acute surgical intervention indicated. Will admit for symptom management and observation. Start decadron 4 q 6. Will have Dr Ellene Route see patient tomorrow.

## 2016-11-21 NOTE — ED Notes (Signed)
Based on head CT pt needs to be placed in the next room per Dr. Venora Maples.

## 2016-11-21 NOTE — ED Triage Notes (Signed)
Pt DC on 9/17, pt had brain surgery for decompression r/t her Chiari I malformation. Pt states she has had an inc in pressure in her head for past 4-5 days. Denies N/V, denies LOC, denies dizziness, blurred vision etc. Just reports pressure. Dr. Venora Maples aware of pt, only order for head CT at this time.

## 2016-11-21 NOTE — ED Notes (Signed)
NF aware.

## 2016-11-22 ENCOUNTER — Other Ambulatory Visit: Payer: Self-pay | Admitting: Neurological Surgery

## 2016-11-22 ENCOUNTER — Observation Stay (HOSPITAL_COMMUNITY): Payer: BLUE CROSS/BLUE SHIELD

## 2016-11-22 LAB — CBC
HCT: 38 % (ref 36.0–46.0)
Hemoglobin: 12.2 g/dL (ref 12.0–15.0)
MCH: 29.7 pg (ref 26.0–34.0)
MCHC: 32.1 g/dL (ref 30.0–36.0)
MCV: 92.5 fL (ref 78.0–100.0)
Platelets: 388 10*3/uL (ref 150–400)
RBC: 4.11 MIL/uL (ref 3.87–5.11)
RDW: 15.7 % — ABNORMAL HIGH (ref 11.5–15.5)
WBC: 21.7 10*3/uL — ABNORMAL HIGH (ref 4.0–10.5)

## 2016-11-22 LAB — BASIC METABOLIC PANEL
Anion gap: 8 (ref 5–15)
BUN: 20 mg/dL (ref 6–20)
CO2: 23 mmol/L (ref 22–32)
Calcium: 8.1 mg/dL — ABNORMAL LOW (ref 8.9–10.3)
Chloride: 107 mmol/L (ref 101–111)
Creatinine, Ser: 0.68 mg/dL (ref 0.44–1.00)
GFR calc Af Amer: >60 mL/min (ref >60)
GFR calc non Af Amer: >60 mL/min (ref >60)
Glucose, Bld: 107 mg/dL — ABNORMAL HIGH (ref 65–99)
Potassium: 4 mmol/L (ref 3.5–5.1)
Sodium: 138 mmol/L (ref 135–145)

## 2016-11-22 MED ORDER — DEXAMETHASONE 1 MG PO TABS: ORAL_TABLET | ORAL | 0 refills | Status: DC

## 2016-11-22 MED ORDER — OXYCODONE-ACETAMINOPHEN 7.5-325 MG PO TABS: 1.0000 | ORAL_TABLET | Freq: Four times a day (QID) | ORAL | 0 refills | Status: DC | PRN

## 2016-11-22 NOTE — Discharge Summary (Signed)
Discharge summary: Date of admission: 11/21/2016 Date of discharge: 11/22/2016 Admitting diagnosis: Subdural hygroma Discharge and final diagnosis: Communicating hydrocephalus Condition on discharge:Stable Hospital course; Patient was seen in the emergency room for evaluation of headaches. She has evidence of pseudomeningocele and subdural hygroma. She has a known Chiari malformation. She will be  To undergo ventriculoperitoneal shunt placement.  Discharge medication Percocet 7.5/325 #30 Decadron taper

## 2016-11-22 NOTE — Care Management Note (Signed)
Case Management Note  Patient Details  Name: Cassidy Tran MRN: [id number] Date of Birth: 07-11-1975  Subjective/Objective:    Pt in with pressure in her head. She is from home with her spouse.                 Action/Plan: Pt discharging home with self care. Pt has PCP, insurance and transportation home. No further needs per CM.  Expected Discharge Date:  11/22/16               Expected Discharge Plan:  Home/Self Care  In-House Referral:     Discharge planning Services     Post Acute Care Choice:    Choice offered to:     DME Arranged:    DME Agency:     HH Arranged:    HH Agency:     Status of Service:  Completed, signed off  If discussed at [employer] of Stay Meetings, dates discussed:    Additional Comments:  Pollie Friar, RN 11/22/2016, 10:39 AM

## 2016-11-22 NOTE — Progress Notes (Signed)
Pt discharged with husband taking all personal belongings. IV discontinued, dry dressing applied.  Discharge instructions provided with prescriptions with verbal understanding. Pt has a follow up appt for surgical procedure per dc summary. No noted distress.

## 2016-11-23 ENCOUNTER — Encounter (HOSPITAL_COMMUNITY): Payer: Self-pay | Admitting: *Deleted

## 2016-11-23 ENCOUNTER — Other Ambulatory Visit: Payer: Self-pay | Admitting: Oncology

## 2016-11-23 ENCOUNTER — Telehealth: Payer: Self-pay | Admitting: *Deleted

## 2016-11-23 DIAGNOSIS — D6801 Von willebrand disease, type 1: Secondary | ICD-10-CM

## 2016-11-23 DIAGNOSIS — D680 Von Willebrand's disease: Secondary | ICD-10-CM

## 2016-11-23 LAB — HIV ANTIBODY (ROUTINE TESTING W REFLEX): HIV Screen 4th Generation wRfx: NONREACTIVE

## 2016-11-23 NOTE — Progress Notes (Signed)
Cassidy Tran has a history of Von Willenbrand Disease, Dr Waymon Budge is patients hemotologist.   Dr Beryle Beams with past surge ries writes order for clotting agents, I called and left a message for [name] at Dr Clarice Pole office, I also gave [name], PA a heads up  about patient.

## 2016-11-23 NOTE — Progress Notes (Signed)
Anesthesia Chart Review: SAME DAY WORK-UP.  Patient is a 41 year old female scheduled for BP shunt insertion on 11/24/16 by Dr. Kristeen Miss. She is s/p suboccipital craniotomy with decompression for Chiari malformation and placement of duraplasty on 11/06/16. She has Von Willebrand disease and in the past has been given DDAVP preoperatively, but prior to craniotomy recommended Humate-P (see below--given 50 U/kg of Humate-P preoperatively). Dr. Beryle Beams was contacted by Dr. Ellene Route today (11/23/16), and Dr. Beryle Beams has entered Humate-P orders and lab orders for this surgery.   History includes smoking, Von Willebrand disease (Type 1 von Willebrands disorder congenital versus acquired related to anti-seizure medication), seizure disorder, obesity, asthma, hysterectomy '14, anxiety, depression, endometriosis, nasal sinus surgery, tonsillectomy, suboccipital craniotomy/decompression Chiari malformation 11/06/16 (discharged home 11/13/16). Re-admitted 11/21/16-11/22/16 for increasing occipital head pressure with imaging revealing pseudomeningocele and subdural hygroma. She was treated with steroids and VP shunt recommended.   - PCP is listed as Chana Bode, PA-C. - Hematologist is Dr. [name]. According to his note on 11/01/16, "It was never clear whether her von Willebrand's disorder was congenital or acquired. I first saw her when she was about 41 years old. Since that time she has been covered for minor surgical procedures including a tonsillectomy at age 38 and a laparoscopic hysterectomy without oophorectomy in 2014,with DDAVP and had no excessive bleeding.Marland KitchenMarland KitchenIn view of need for major intracranial surgery, she will need to be covered with clotting concentrate. We cannot rely on DDAVP. The preferred product is Humate-P which contains both factor VIII and von Willebrand factor. Although there is now a purified recombinant von Willebrand factor, this product works bested given with concomitant factor  VIII. Recommendation [FOR CRANIOTOMY 11/06/16; DR. Beryle Beams IS ENTERING NEW ORDERS FOR 11/24/16 VP SHUNT]: Based on current weight 89 kg an approximate half-life of Humate-P of 8-12 hours, I will order Humate-P 50 units per kilogram every 8 hours for the first 24 hours, every 12 hours for the next 48-hour's, then every 24 hours to complete 7 days total..Marland KitchenI will get a baseline von Willebrand profile today and then follow ristocetin cofactor activity perioperatively."  Meds include Advair, Fioricet, Decadron, Depakote ER, Neurontin, meclizine, Nasonex, Percocet, ProAir, Zonegran.  EKG 11/02/16: NSR.  Spirometry 09/28/15: FVC 2.21 (66%), FEV1 1.53 (56%), /fev1/////5 72 /886%), FEF 25-75% 1.11 (36%).   CT head 11/21/16: IMPRESSION: 1. Interval development of low-attenuation extra-axial collections over the cerebral convexities, falx, and overlying cerebellar hemispheres measuring up to 6 millimeters. Slight asymmetry in the collections results and 3 millimeters of right-to-left midline shift. No high attenuation blood products identified. Findings probably represent hygroma. 2. Increased size of fluid collections within the posterior suboccipital soft tissues at site of suboccipital craniectomy, differential includes postoperative seroma versus pseudomeningocele.  CT head 11/22/16: IMPRESSION: 1. Improvement in interhemispheric and right convexity subdural hygroma. Mild midline shift to the left unchanged 2. Effacement of the sulci diffusely suggesting increased intracranial pressure similar to prior studies 3. Suboccipital craniectomy. Cerebellar tonsils remain low lying and unchanged.  Labs from 11/21/16 noted. WBC 21.7 (history Decadron), H/H 12.2/38.0. Cr 0.68. Glucose 107. Non-reactive HIV antibody. For PT/PTT and T&S on the day of surgery.   If no acute changes then I anticipate that she can proceed as planned. See hematology pre-operative orders.   George Hugh [facility]  Short Stay Center/Anesthesiology Phone [phone] 11/23/2016 3:35 PM

## 2016-11-23 NOTE — Telephone Encounter (Signed)
Call from radiology asking about medication per dr Beryle Beams, gave them his pager#

## 2016-11-24 ENCOUNTER — Encounter (HOSPITAL_COMMUNITY): Payer: Self-pay | Admitting: Certified Registered Nurse Anesthetist

## 2016-11-24 ENCOUNTER — Inpatient Hospital Stay (HOSPITAL_COMMUNITY): Payer: BLUE CROSS/BLUE SHIELD | Admitting: Vascular Surgery

## 2016-11-24 ENCOUNTER — Encounter (HOSPITAL_COMMUNITY)
Admission: RE | Disposition: A | Discharge status: Home / Self Care | Payer: Self-pay | Source: Ambulatory Visit | Attending: Neurological Surgery

## 2016-11-24 ENCOUNTER — Inpatient Hospital Stay (HOSPITAL_COMMUNITY)
Admission: RE | Admit: 2016-11-24 | Discharge: 2016-11-27 | DRG: 032 | Disposition: A | Discharge status: Home / Self Care | Payer: BLUE CROSS/BLUE SHIELD | Source: Ambulatory Visit | Attending: Neurological Surgery | Admitting: Neurological Surgery

## 2016-11-24 DIAGNOSIS — J45909 Unspecified asthma, uncomplicated: Secondary | ICD-10-CM | POA: Diagnosis not present

## 2016-11-24 DIAGNOSIS — G40909 Epilepsy, unspecified, not intractable, without status epilepticus: Secondary | ICD-10-CM | POA: Diagnosis present

## 2016-11-24 DIAGNOSIS — R51 Headache: Secondary | ICD-10-CM | POA: Diagnosis present

## 2016-11-24 DIAGNOSIS — G9619 Other disorders of meninges, not elsewhere classified: Secondary | ICD-10-CM | POA: Diagnosis present

## 2016-11-24 DIAGNOSIS — Z87728 Personal history of other specified (corrected) congenital malformations of nervous system and sense organs: Secondary | ICD-10-CM | POA: Diagnosis not present

## 2016-11-24 DIAGNOSIS — F17200 Nicotine dependence, unspecified, uncomplicated: Secondary | ICD-10-CM | POA: Diagnosis present

## 2016-11-24 DIAGNOSIS — G960 Cerebrospinal fluid leak: Secondary | ICD-10-CM | POA: Diagnosis present

## 2016-11-24 DIAGNOSIS — Z90710 Acquired absence of both cervix and uterus: Secondary | ICD-10-CM

## 2016-11-24 DIAGNOSIS — D66 Hereditary factor VIII deficiency: Secondary | ICD-10-CM | POA: Diagnosis not present

## 2016-11-24 DIAGNOSIS — Z9889 Other specified postprocedural states: Secondary | ICD-10-CM | POA: Diagnosis not present

## 2016-11-24 DIAGNOSIS — D6801 Von willebrand disease, type 1: Secondary | ICD-10-CM

## 2016-11-24 DIAGNOSIS — G910 Communicating hydrocephalus: Secondary | ICD-10-CM | POA: Diagnosis present

## 2016-11-24 DIAGNOSIS — E669 Obesity, unspecified: Secondary | ICD-10-CM | POA: Diagnosis present

## 2016-11-24 DIAGNOSIS — Z7982 Long term (current) use of aspirin: Secondary | ICD-10-CM

## 2016-11-24 DIAGNOSIS — D680 Von Willebrand's disease: Secondary | ICD-10-CM | POA: Diagnosis not present

## 2016-11-24 DIAGNOSIS — Z6834 Body mass index (BMI) 34.0-34.9, adult: Secondary | ICD-10-CM

## 2016-11-24 HISTORY — PX: APPLICATION OF CRANIAL NAVIGATION: SHX6578

## 2016-11-24 HISTORY — PX: VENTRICULOPERITONEAL SHUNT: SHX204

## 2016-11-24 HISTORY — DX: Pneumonia, unspecified organism: J18.9

## 2016-11-24 HISTORY — DX: Malignant (primary) neoplasm, unspecified: C80.1

## 2016-11-24 LAB — BASIC METABOLIC PANEL
Anion gap: 9 (ref 5–15)
BUN: 19 mg/dL (ref 6–20)
CALCIUM: 8.5 mg/dL — AB (ref 8.9–10.3)
CO2: 24 mmol/L (ref 22–32)
CREATININE: 0.61 mg/dL (ref 0.44–1.00)
Chloride: 104 mmol/L (ref 101–111)
GFR calc Af Amer: >60 mL/min (ref >60)
GFR calc non Af Amer: >60 mL/min (ref >60)
GLUCOSE: 97 mg/dL (ref 65–99)
Potassium: 4.1 mmol/L (ref 3.5–5.1)
Sodium: 137 mmol/L (ref 135–145)

## 2016-11-24 LAB — APTT: APTT: 29 s (ref 24–36)

## 2016-11-24 LAB — PROTIME-INR
INR: 0.91
PROTHROMBIN TIME: 12.1 s (ref 11.4–15.2)

## 2016-11-24 SURGERY — SHUNT INSERTION VENTRICULAR-PERITONEAL
Anesthesia: General | Site: Head

## 2016-11-24 MED ORDER — FENTANYL CITRATE (PF) 100 MCG/2ML IJ SOLN
25.0000 ug | INTRAMUSCULAR | Status: DC | PRN
  Administered 2016-11-24 (×2): 50 ug via INTRAVENOUS

## 2016-11-24 MED ORDER — FENTANYL CITRATE (PF) 250 MCG/5ML IJ SOLN
INTRAMUSCULAR | Status: AC
  Filled 2016-11-24: qty 5

## 2016-11-24 MED ORDER — LIDOCAINE HCL (CARDIAC) 20 MG/ML IV SOLN
INTRAVENOUS | Status: DC | PRN
  Administered 2016-11-24: 60 mg via INTRAVENOUS

## 2016-11-24 MED ORDER — ACETAMINOPHEN 500 MG PO TABS: 1000.0000 mg | ORAL_TABLET | Freq: Four times a day (QID) | ORAL | Status: DC | PRN

## 2016-11-24 MED ORDER — OXYCODONE-ACETAMINOPHEN 7.5-325 MG PO TABS
1.0000 | ORAL_TABLET | Freq: Four times a day (QID) | ORAL | Status: DC | PRN
Start: 2016-11-24 — End: 2016-11-27
  Administered 2016-11-24 – 2016-11-27 (×9): 1 via ORAL
  Filled 2016-11-24 (×10): qty 1

## 2016-11-24 MED ORDER — BUTALBITAL-APAP-CAFFEINE 50-325-40 MG PO TABS: 1.0000 | ORAL_TABLET | ORAL | Status: DC | PRN

## 2016-11-24 MED ORDER — SODIUM CHLORIDE 0.9 % IV SOLN
INTRAVENOUS | Status: DC | PRN
  Administered 2016-11-24 (×2): via INTRAVENOUS

## 2016-11-24 MED ORDER — PROMETHAZINE HCL 25 MG PO TABS: 12.5000 mg | ORAL_TABLET | ORAL | Status: DC | PRN

## 2016-11-24 MED ORDER — POLYETHYLENE GLYCOL 3350 17 G PO PACK: 17.0000 g | PACK | Freq: Every day | ORAL | Status: DC | PRN

## 2016-11-24 MED ORDER — ANTIHEMOPHILIC FACTOR-VWF 250-600 UNITS IV SOLR
5034.0000 [IU] | Freq: Two times a day (BID) | INTRAVENOUS | Status: AC
  Administered 2016-11-25 – 2016-11-26 (×3): 5034 [IU] via INTRAVENOUS
  Filled 2016-11-24: qty 0
  Filled 2016-11-24: qty 5034
  Filled 2016-11-24: qty 0
  Filled 2016-11-24 (×2): qty 5034

## 2016-11-24 MED ORDER — DEXAMETHASONE SODIUM PHOSPHATE 4 MG/ML IJ SOLN
INTRAMUSCULAR | Status: DC | PRN
  Administered 2016-11-24: 10 mg via INTRAVENOUS

## 2016-11-24 MED ORDER — FLEET ENEMA 7-19 GM/118ML RE ENEM: 1.0000 | ENEMA | Freq: Once | RECTAL | Status: DC | PRN

## 2016-11-24 MED ORDER — SODIUM CHLORIDE 0.9 % IV SOLN
INTRAVENOUS | Status: DC | PRN
  Administered 2016-11-24: 1000 mg via INTRAVENOUS

## 2016-11-24 MED ORDER — PANTOPRAZOLE SODIUM 40 MG IV SOLR
40.0000 mg | Freq: Every day | INTRAVENOUS | Status: DC
  Administered 2016-11-24 – 2016-11-25 (×2): 40 mg via INTRAVENOUS
  Filled 2016-11-24 (×2): qty 40

## 2016-11-24 MED ORDER — BUPIVACAINE HCL (PF) 0.5 % IJ SOLN
INTRAMUSCULAR | Status: DC | PRN
  Administered 2016-11-24: 4.5 mL

## 2016-11-24 MED ORDER — FENTANYL CITRATE (PF) 100 MCG/2ML IJ SOLN
INTRAMUSCULAR | Status: AC
  Filled 2016-11-24: qty 2

## 2016-11-24 MED ORDER — DOCUSATE SODIUM 100 MG PO CAPS
100.0000 mg | ORAL_CAPSULE | Freq: Two times a day (BID) | ORAL | Status: DC
  Administered 2016-11-24 – 2016-11-27 (×4): 100 mg via ORAL
  Filled 2016-11-24 (×6): qty 1

## 2016-11-24 MED ORDER — MEPERIDINE HCL 25 MG/ML IJ SOLN: 6.2500 mg | INTRAMUSCULAR | Status: DC | PRN

## 2016-11-24 MED ORDER — THROMBIN 5000 UNITS EX SOLR
CUTANEOUS | Status: DC | PRN
  Administered 2016-11-24 (×2): 5000 [IU] via TOPICAL

## 2016-11-24 MED ORDER — MIDAZOLAM HCL 5 MG/5ML IJ SOLN
INTRAMUSCULAR | Status: DC | PRN
  Administered 2016-11-24 (×2): 1 mg via INTRAVENOUS

## 2016-11-24 MED ORDER — GLYCOPYRROLATE 0.2 MG/ML IJ SOLN
INTRAMUSCULAR | Status: DC | PRN
  Administered 2016-11-24: .6 mg via INTRAVENOUS

## 2016-11-24 MED ORDER — NEOSTIGMINE METHYLSULFATE 10 MG/10ML IV SOLN
INTRAVENOUS | Status: DC | PRN
  Administered 2016-11-24: 4 mg via INTRAVENOUS

## 2016-11-24 MED ORDER — VANCOMYCIN HCL IN DEXTROSE 1-5 GM/200ML-% IV SOLN
INTRAVENOUS | Status: AC
  Filled 2016-11-24: qty 200

## 2016-11-24 MED ORDER — FENTANYL CITRATE (PF) 100 MCG/2ML IJ SOLN
INTRAMUSCULAR | Status: DC | PRN
  Administered 2016-11-24 (×5): 50 ug via INTRAVENOUS

## 2016-11-24 MED ORDER — ROCURONIUM BROMIDE 100 MG/10ML IV SOLN
INTRAVENOUS | Status: DC | PRN
  Administered 2016-11-24: 50 mg via INTRAVENOUS
  Administered 2016-11-24: 20 mg via INTRAVENOUS

## 2016-11-24 MED ORDER — ONDANSETRON HCL 4 MG/2ML IJ SOLN
INTRAMUSCULAR | Status: AC
  Filled 2016-11-24: qty 2

## 2016-11-24 MED ORDER — ONDANSETRON HCL 4 MG/2ML IJ SOLN
INTRAMUSCULAR | Status: DC | PRN
  Administered 2016-11-24: 4 mg via INTRAVENOUS

## 2016-11-24 MED ORDER — ALBUTEROL SULFATE (2.5 MG/3ML) 0.083% IN NEBU: 2.5000 mg | INHALATION_SOLUTION | RESPIRATORY_TRACT | Status: DC | PRN

## 2016-11-24 MED ORDER — LIDOCAINE-EPINEPHRINE 1 %-1:100000 IJ SOLN
INTRAMUSCULAR | Status: AC
  Filled 2016-11-24: qty 1

## 2016-11-24 MED ORDER — ONDANSETRON HCL 4 MG PO TABS: 4.0000 mg | ORAL_TABLET | ORAL | Status: DC | PRN

## 2016-11-24 MED ORDER — ZOLPIDEM TARTRATE 5 MG PO TABS
5.0000 mg | ORAL_TABLET | Freq: Every evening | ORAL | Status: DC | PRN
  Administered 2016-11-24 – 2016-11-26 (×2): 5 mg via ORAL
  Filled 2016-11-24 (×2): qty 1

## 2016-11-24 MED ORDER — VANCOMYCIN HCL IN DEXTROSE 1-5 GM/200ML-% IV SOLN
1000.0000 mg | Freq: Three times a day (TID) | INTRAVENOUS | Status: DC
  Administered 2016-11-24 – 2016-11-27 (×9): 1000 mg via INTRAVENOUS
  Filled 2016-11-24 (×13): qty 200

## 2016-11-24 MED ORDER — 0.9 % SODIUM CHLORIDE (POUR BTL) OPTIME
TOPICAL | Status: DC | PRN
  Administered 2016-11-24: 1000 mL

## 2016-11-24 MED ORDER — ONDANSETRON HCL 4 MG/2ML IJ SOLN
4.0000 mg | Freq: Once | INTRAMUSCULAR | Status: AC | PRN
  Administered 2016-11-24: 4 mg via INTRAVENOUS

## 2016-11-24 MED ORDER — ANTIHEMOPHILIC FACTOR-VWF 250-600 UNITS IV SOLR
5139.0000 [IU] | Freq: Three times a day (TID) | INTRAVENOUS | Status: DC
  Filled 2016-11-24 (×2): qty 5139

## 2016-11-24 MED ORDER — BISACODYL 10 MG RE SUPP: 10.0000 mg | Freq: Every day | RECTAL | Status: DC | PRN

## 2016-11-24 MED ORDER — EPHEDRINE 5 MG/ML INJ
INTRAVENOUS | Status: AC
  Filled 2016-11-24: qty 10

## 2016-11-24 MED ORDER — ANTIHEMOPHILIC FACTOR-VWF 250-600 UNITS IV SOLR
5139.0000 [IU] | Freq: Three times a day (TID) | INTRAVENOUS | Status: AC
  Administered 2016-11-24 – 2016-11-25 (×2): 5139 [IU] via INTRAVENOUS
  Filled 2016-11-24: qty 5139
  Filled 2016-11-24: qty 0

## 2016-11-24 MED ORDER — ANTIHEMOPHILIC FACTOR-VWF 250-600 UNITS IV SOLR
60.0000 [IU]/kg | Freq: Three times a day (TID) | INTRAVENOUS | Status: DC
  Filled 2016-11-24: qty 5310

## 2016-11-24 MED ORDER — MECLIZINE HCL 25 MG PO TABS
25.0000 mg | ORAL_TABLET | Freq: Three times a day (TID) | ORAL | Status: DC | PRN
  Filled 2016-11-24: qty 1

## 2016-11-24 MED ORDER — LIDOCAINE-EPINEPHRINE 1 %-1:100000 IJ SOLN
INTRAMUSCULAR | Status: DC | PRN
  Administered 2016-11-24: 4.5 mL

## 2016-11-24 MED ORDER — ANTIHEMOPHILIC FACTOR-VWF 250-600 UNITS IV SOLR
5497.0000 [IU] | Freq: Once | INTRAVENOUS | Status: AC
  Administered 2016-11-24: 5497 [IU] via INTRAVENOUS
  Filled 2016-11-24: qty 0

## 2016-11-24 MED ORDER — ANTIHEMOPHILIC FACTOR-VWF 250-600 UNITS IV SOLR: 60.0000 [IU]/kg | Freq: Three times a day (TID) | INTRAVENOUS | Status: DC

## 2016-11-24 MED ORDER — PROPOFOL 10 MG/ML IV BOLUS
INTRAVENOUS | Status: DC | PRN
Start: 2016-11-24 — End: 2016-11-24
  Administered 2016-11-24: 100 mg via INTRAVENOUS
  Administered 2016-11-24: 20 mg via INTRAVENOUS
  Administered 2016-11-24: 50 mg via INTRAVENOUS

## 2016-11-24 MED ORDER — NICOTINE 14 MG/24HR TD PT24
14.0000 mg | MEDICATED_PATCH | Freq: Every day | TRANSDERMAL | Status: DC
  Administered 2016-11-24 – 2016-11-27 (×4): 14 mg via TRANSDERMAL
  Filled 2016-11-24 (×4): qty 1

## 2016-11-24 MED ORDER — ONDANSETRON HCL 4 MG/2ML IJ SOLN: 4.0000 mg | INTRAMUSCULAR | Status: DC | PRN

## 2016-11-24 MED ORDER — LABETALOL HCL 5 MG/ML IV SOLN: 10.0000 mg | INTRAVENOUS | Status: DC | PRN

## 2016-11-24 MED ORDER — BACITRACIN ZINC 500 UNIT/GM EX OINT
TOPICAL_OINTMENT | CUTANEOUS | Status: AC
  Filled 2016-11-24: qty 28.35

## 2016-11-24 MED ORDER — EPHEDRINE SULFATE 50 MG/ML IJ SOLN
INTRAMUSCULAR | Status: DC | PRN
  Administered 2016-11-24 (×2): 10 mg via INTRAVENOUS

## 2016-11-24 MED ORDER — ANTIHEMOPHILIC FACTOR-VWF 250-600 UNITS IV SOLR
5138.0000 [IU] | Freq: Once | INTRAVENOUS | Status: AC
Start: 2016-11-24 — End: 2016-11-24
  Administered 2016-11-24: 5138 [IU] via INTRAVENOUS
  Filled 2016-11-24: qty 5138

## 2016-11-24 MED ORDER — GABAPENTIN 300 MG PO CAPS
300.0000 mg | ORAL_CAPSULE | Freq: Two times a day (BID) | ORAL | Status: DC
  Administered 2016-11-24 – 2016-11-27 (×6): 300 mg via ORAL
  Filled 2016-11-24 (×6): qty 1

## 2016-11-24 MED ORDER — PROPOFOL 10 MG/ML IV BOLUS
INTRAVENOUS | Status: AC
  Filled 2016-11-24: qty 20

## 2016-11-24 MED ORDER — SODIUM CHLORIDE 0.9 % IV SOLN
INTRAVENOUS | Status: DC
  Administered 2016-11-24: 13:00:00 via INTRAVENOUS

## 2016-11-24 MED ORDER — ANTIHEMOPHILIC FACTOR-VWF 250-600 UNITS IV SOLR
5034.0000 [IU] | INTRAVENOUS | Status: DC
  Filled 2016-11-24: qty 5034

## 2016-11-24 MED ORDER — DIVALPROEX SODIUM ER 500 MG PO TB24
1000.0000 mg | ORAL_TABLET | Freq: Two times a day (BID) | ORAL | Status: DC
  Administered 2016-11-24 – 2016-11-27 (×6): 1000 mg via ORAL
  Filled 2016-11-24 (×7): qty 2

## 2016-11-24 MED ORDER — MOMETASONE FURO-FORMOTEROL FUM 200-5 MCG/ACT IN AERO
2.0000 | INHALATION_SPRAY | Freq: Two times a day (BID) | RESPIRATORY_TRACT | Status: DC
  Administered 2016-11-24 – 2016-11-27 (×5): 2 via RESPIRATORY_TRACT
  Filled 2016-11-24: qty 8.8

## 2016-11-24 MED ORDER — HYDROMORPHONE HCL 1 MG/ML IJ SOLN
0.5000 mg | INTRAMUSCULAR | Status: DC | PRN
  Administered 2016-11-24 – 2016-11-26 (×10): 0.5 mg via INTRAVENOUS
  Filled 2016-11-24 (×10): qty 0.5

## 2016-11-24 MED ORDER — MIDAZOLAM HCL 2 MG/2ML IJ SOLN
INTRAMUSCULAR | Status: AC
  Filled 2016-11-24: qty 2

## 2016-11-24 MED ORDER — ZONISAMIDE 100 MG PO CAPS
100.0000 mg | ORAL_CAPSULE | Freq: Two times a day (BID) | ORAL | Status: DC
  Administered 2016-11-24 – 2016-11-27 (×6): 100 mg via ORAL
  Filled 2016-11-24 (×7): qty 1

## 2016-11-24 MED ORDER — THROMBIN 5000 UNITS EX SOLR
CUTANEOUS | Status: AC
  Filled 2016-11-24: qty 15000

## 2016-11-24 MED ORDER — SENNA 8.6 MG PO TABS
1.0000 | ORAL_TABLET | Freq: Two times a day (BID) | ORAL | Status: DC
  Administered 2016-11-24 – 2016-11-27 (×4): 8.6 mg via ORAL
  Filled 2016-11-24 (×6): qty 1

## 2016-11-24 MED ORDER — FLUTICASONE PROPIONATE 50 MCG/ACT NA SUSP
1.0000 | Freq: Every day | NASAL | Status: DC
  Administered 2016-11-24 – 2016-11-27 (×4): 1 via NASAL
  Filled 2016-11-24: qty 16

## 2016-11-24 MED ORDER — BACITRACIN ZINC 500 UNIT/GM EX OINT
TOPICAL_OINTMENT | CUTANEOUS | Status: DC | PRN
  Administered 2016-11-24: 1 via TOPICAL

## 2016-11-24 MED ORDER — DEXAMETHASONE 2 MG PO TABS
1.0000 mg | ORAL_TABLET | Freq: Two times a day (BID) | ORAL | Status: DC
  Administered 2016-11-24 – 2016-11-27 (×7): 1 mg via ORAL
  Filled 2016-11-24 (×8): qty 1

## 2016-11-24 MED ORDER — HEMOSTATIC AGENTS (NO CHARGE) OPTIME
TOPICAL | Status: DC | PRN
  Administered 2016-11-24: 1 via TOPICAL

## 2016-11-24 MED ORDER — BACITRACIN 50000 UNITS IM SOLR
INTRAMUSCULAR | Status: DC | PRN
  Administered 2016-11-24: 500 mL

## 2016-11-24 MED ORDER — BUPIVACAINE HCL (PF) 0.5 % IJ SOLN
INTRAMUSCULAR | Status: AC
  Filled 2016-11-24: qty 30

## 2016-11-24 SURGICAL SUPPLY — 79 items
ADH SKN CLS APL DERMABOND .7 (GAUZE/BANDAGES/DRESSINGS) ×1
BAG DECANTER FOR FLEXI CONT (MISCELLANEOUS) ×2 IMPLANT
BANDAGE GAUZE 4  KLING STR (GAUZE/BANDAGES/DRESSINGS) IMPLANT
BLADE CLIPPER SURG (BLADE) ×3 IMPLANT
BNDG GAUZE ELAST 4 BULKY (GAUZE/BANDAGES/DRESSINGS) IMPLANT
BOOT SUTURE AID YELLOW STND (SUTURE) IMPLANT
BUR ACORN 6.0 PRECISION (BURR) ×2 IMPLANT
BUR MATCHSTICK NEURO 3.0 LAGG (BURR) ×1 IMPLANT
CANISTER SUCT 3000ML PPV (MISCELLANEOUS) ×2 IMPLANT
CARTRIDGE OIL MAESTRO DRILL (MISCELLANEOUS) ×2 IMPLANT
CLIP RANEY DISP (INSTRUMENTS) IMPLANT
CONT SPEC STER OR (MISCELLANEOUS) ×1 IMPLANT
DECANTER SPIKE VIAL GLASS SM (MISCELLANEOUS) ×1 IMPLANT
DERMABOND ADVANCED (GAUZE/BANDAGES/DRESSINGS) ×1
DERMABOND ADVANCED .7 DNX12 (GAUZE/BANDAGES/DRESSINGS) IMPLANT
DIFFUSER DRILL AIR PNEUMATIC (MISCELLANEOUS) ×2 IMPLANT
DRAPE INCISE IOBAN 85X60 (DRAPES) ×2 IMPLANT
DRAPE ORTHO SPLIT 77X108 STRL (DRAPES) ×4
DRAPE POUCH INSTRU U-SHP 10X18 (DRAPES) ×2 IMPLANT
DRAPE SURG ORHT 6 SPLT 77X108 (DRAPES) ×2 IMPLANT
DRSG OPSITE POSTOP 3X4 (GAUZE/BANDAGES/DRESSINGS) ×1 IMPLANT
DURAPREP 26ML APPLICATOR (WOUND CARE) ×4 IMPLANT
ELECT REM PT RETURN 9FT ADLT (ELECTROSURGICAL) ×2
ELECTRODE REM PT RTRN 9FT ADLT (ELECTROSURGICAL) ×1 IMPLANT
GAUZE SPONGE 4X4 16PLY XRAY LF (GAUZE/BANDAGES/DRESSINGS) IMPLANT
GLOVE BIO SURGEON STRL SZ7.5 (GLOVE) IMPLANT
GLOVE BIOGEL PI IND STRL 6.5 (GLOVE) IMPLANT
GLOVE BIOGEL PI IND STRL 7.0 (GLOVE) IMPLANT
GLOVE BIOGEL PI IND STRL 7.5 (GLOVE) IMPLANT
GLOVE BIOGEL PI IND STRL 8 (GLOVE) IMPLANT
GLOVE BIOGEL PI IND STRL 8.5 (GLOVE) ×1 IMPLANT
GLOVE BIOGEL PI INDICATOR 6.5 (GLOVE) ×1
GLOVE BIOGEL PI INDICATOR 7.0 (GLOVE) ×1
GLOVE BIOGEL PI INDICATOR 7.5 (GLOVE) ×1
GLOVE BIOGEL PI INDICATOR 8 (GLOVE) ×4
GLOVE BIOGEL PI INDICATOR 8.5 (GLOVE) ×1
GLOVE ECLIPSE 7.5 STRL STRAW (GLOVE) ×3 IMPLANT
GLOVE ECLIPSE 8.5 STRL (GLOVE) ×3 IMPLANT
GLOVE EXAM NITRILE LRG STRL (GLOVE) IMPLANT
GLOVE EXAM NITRILE XL STR (GLOVE) IMPLANT
GLOVE EXAM NITRILE XS STR PU (GLOVE) IMPLANT
GLOVE SS BIOGEL STRL SZ 7.5 (GLOVE) IMPLANT
GLOVE SUPERSENSE BIOGEL SZ 7.5 (GLOVE) ×1
GLOVE SURG SS PI 6.5 STRL IVOR (GLOVE) ×2 IMPLANT
GOWN STRL REUS W/ TWL LRG LVL3 (GOWN DISPOSABLE) IMPLANT
GOWN STRL REUS W/ TWL XL LVL3 (GOWN DISPOSABLE) ×1 IMPLANT
GOWN STRL REUS W/TWL 2XL LVL3 (GOWN DISPOSABLE) ×4 IMPLANT
GOWN STRL REUS W/TWL LRG LVL3 (GOWN DISPOSABLE) ×4
GOWN STRL REUS W/TWL XL LVL3 (GOWN DISPOSABLE)
GUIDEWIRE STRAIGHT .035 260CM (WIRE) ×1 IMPLANT
HEMOSTAT SURGICEL 2X14 (HEMOSTASIS) ×1 IMPLANT
KIT BASIN OR (CUSTOM PROCEDURE TRAY) ×2 IMPLANT
KIT ROOM TURNOVER OR (KITS) ×2 IMPLANT
MARKER SPHERE PSV REFLC 13MM (MARKER) ×4 IMPLANT
NDL HYPO 25X1 1.5 SAFETY (NEEDLE) ×1 IMPLANT
NEEDLE HYPO 25X1 1.5 SAFETY (NEEDLE) ×2 IMPLANT
NS IRRIG 1000ML POUR BTL (IV SOLUTION) ×2 IMPLANT
OIL CARTRIDGE MAESTRO DRILL (MISCELLANEOUS)
PACK LAMINECTOMY NEURO (CUSTOM PROCEDURE TRAY) ×2 IMPLANT
PAD ARMBOARD 7.5X6 YLW CONV (MISCELLANEOUS) ×6 IMPLANT
SHEATH PERITONEAL INTRO 46 (MISCELLANEOUS) ×1 IMPLANT
SPONGE LAP 4X18 X RAY DECT (DISPOSABLE) IMPLANT
SPONGE SURGIFOAM ABS GEL SZ50 (HEMOSTASIS) ×2 IMPLANT
STAPLER SKIN PROX WIDE 3.9 (STAPLE) ×2 IMPLANT
STRIP CLOSURE SKIN 1/4X4 (GAUZE/BANDAGES/DRESSINGS) IMPLANT
SUT ETHILON 3 0 PS 1 (SUTURE) IMPLANT
SUT NURALON 4 0 TR CR/8 (SUTURE) IMPLANT
SUT SILK 0 TIES 10X30 (SUTURE) IMPLANT
SUT SILK 1 TIES 10X30 (SUTURE) ×1 IMPLANT
SUT SILK 3 0 SH 30 (SUTURE) ×1 IMPLANT
SUT VIC AB 2-0 CP2 18 (SUTURE) ×2 IMPLANT
SUT VIC AB 3-0 SH 8-18 (SUTURE) ×2 IMPLANT
SYR CONTROL 10ML LL (SYRINGE) ×1 IMPLANT
TOWEL GREEN STERILE (TOWEL DISPOSABLE) ×2 IMPLANT
TOWEL GREEN STERILE FF (TOWEL DISPOSABLE) ×2 IMPLANT
TRAY FOLEY W/METER SILVER 16FR (SET/KITS/TRAYS/PACK) ×1 IMPLANT
UNDERPAD 30X30 (UNDERPADS AND DIAPERS) ×1 IMPLANT
VALVE RT ANGLE UNITIZE DIST (Valve) ×1 IMPLANT
WATER STERILE IRR 1000ML POUR (IV SOLUTION) ×2 IMPLANT

## 2016-11-24 NOTE — Anesthesia Preprocedure Evaluation (Addendum)
Anesthesia Evaluation  Patient identified by MRN, date of birth, ID band Patient awake    Reviewed: Allergy & Precautions, NPO status , Patient's Chart, lab work & pertinent test results  Airway Mallampati: II  TM Distance: >3 FB Neck ROM: Full    Dental  (+) Teeth Intact, Dental Advisory Given   Pulmonary asthma , Current Smoker,    breath sounds clear to auscultation       Cardiovascular negative cardio ROS   Rhythm:Regular Rate:Normal     Neuro/Psych  Headaches, Seizures -,  PSYCHIATRIC DISORDERS Anxiety Depression negative neurological ROS     GI/Hepatic negative GI ROS, Neg liver ROS,   Endo/Other  negative endocrine ROS  Renal/GU negative Renal ROS     Musculoskeletal negative musculoskeletal ROS (+) Arthritis ,   Abdominal (+) + obese,   Peds  Hematology  (+) Blood dyscrasia, ,   Anesthesia Other Findings VWD  Reproductive/Obstetrics                            EKG: normal sinus rhythm.  Anesthesia Physical  Anesthesia Plan  ASA: III  Anesthesia Plan: General   Post-op Pain Management:    Induction: Intravenous  PONV Risk Score and Plan: 3 and Ondansetron, Dexamethasone, Midazolam and Treatment may vary due to age or medical condition  Airway Management Planned: Oral ETT  Additional Equipment:   Intra-op Plan:   Post-operative Plan: Extubation in OR  Informed Consent: I have reviewed the patients History and Physical, chart, labs and discussed the procedure including the risks, benefits and alternatives for the proposed anesthesia with the patient or authorized representative who has indicated his/her understanding and acceptance.   Dental advisory given and Dental Advisory Given  Plan Discussed with: CRNA, Anesthesiologist and Surgeon  Anesthesia Plan Comments: (Recommendations per Hematology:  Humate-P 50 units per kilogram every 8 hours for the first 24 hours,  every 12 hours for the next 48-hour's, then every 24 hours to complete 7 days total.  Dr. Beryle Beams will follow ristocetin cofactor activity perioperatively.  )       Anesthesia Quick Evaluation

## 2016-11-24 NOTE — Anesthesia Postprocedure Evaluation (Signed)
Anesthesia Post Note  Patient: Cassidy Tran  Procedure(s) Performed: Procedure(s) (LRB): SHUNT INSERTION VENTRICULAR-PERITONEAL with Brainlab (N/A) APPLICATION OF CRANIAL NAVIGATION (N/A)     Patient location during evaluation: PACU Anesthesia Type: General Level of consciousness: awake and alert Pain management: pain level controlled Vital Signs Assessment: post-procedure vital signs reviewed and stable Respiratory status: spontaneous breathing, nonlabored ventilation, respiratory function stable and patient connected to nasal cannula oxygen Cardiovascular status: blood pressure returned to baseline and stable Postop Assessment: no apparent nausea or vomiting Anesthetic complications: no    Last Vitals:  Vitals:   11/24/16 1027 11/24/16 1030  BP: (!) 148/68   Pulse: 62 63  Resp: 18 (!) 25  Temp:    SpO2: 96% 94%    Last Pain:  Vitals:   11/24/16 1027  TempSrc:   PainSc: 8                  Quentavious Rittenhouse

## 2016-11-24 NOTE — Progress Notes (Signed)
Pharmacy Antibiotic Note  Cassidy Tran is a 41 y.o. female admitted on 11/24/2016 for intraventricular shunt with application of cranial navigation.  Pharmacy has been consulted for vancomycin dosing for surgical prophylaxis in patient with an intraventricular shunt.  Baseline labs reviewed and noted that patient received vancomycin 1gm IV at 0830 today.   Plan: Vanc 1gm IV Q8H for goal trough ~15 mcg/mL Monitor renal fxn, clinical progress, vanc trough as indicated   Height: 5\' 3"  (160 cm) Weight: 195 lb (88.5 kg) IBW/kg (Calculated) : 52.4  Temp (24hrs), Avg:98 F (36.7 C), Min:97.6 F (36.4 C), Max:98.5 F (36.9 C)   Recent Labs Lab 11/21/16 0110 11/24/16 0620  WBC 21.7*  --   CREATININE 0.68 0.61    Estimated Creatinine Clearance: 96.6 mL/min (by C-G formula based on SCr of 0.61 mg/dL).    Allergies  Allergen Reactions  . Aspirin Other (See Comments)    Childhood reaction  . Codeine Nausea And Vomiting    Tolerates Hydrocodone  . Erythromycin Other (See Comments)    Childhood reaction, tolerate Zpak  . Lactose Intolerance (Gi) Diarrhea  . Nsaids Nausea And Vomiting  . Penicillins Other (See Comments)    Has patient had a PCN reaction causing immediate rash, facial/tongue/throat swelling, SOB or lightheadedness with hypotension: doesn't remember childhood Reaction  Has patient had a PCN reaction causing severe rash involving mucus membranes or skin necrosis: NO Has patient had a PCN reaction that required hospitalization NO Has patient had a PCN reaction occurring within the last 10 years: NO If all of the above answers are "NO", then may proceed with Cephalosporin use.      Vanc 9/28 >>   Hibba Schram D. Mina Marble, PharmD, BCPS Pager:  [phone] 11/24/2016, 3:38 PM

## 2016-11-24 NOTE — Anesthesia Postprocedure Evaluation (Signed)
Anesthesia Post Note  Patient: Cassidy Tran  Procedure(s) Performed: Procedure(s) (LRB): SHUNT INSERTION VENTRICULAR-PERITONEAL with Brainlab (N/A) APPLICATION OF CRANIAL NAVIGATION (N/A)     Patient location during evaluation: PACU Anesthesia Type: General Level of consciousness: awake and alert Pain management: pain level controlled Vital Signs Assessment: post-procedure vital signs reviewed and stable Respiratory status: spontaneous breathing, nonlabored ventilation, respiratory function stable and patient connected to nasal cannula oxygen Cardiovascular status: blood pressure returned to baseline and stable Postop Assessment: no apparent nausea or vomiting Anesthetic complications: no    Last Vitals:  Vitals:   11/24/16 1027 11/24/16 1030  BP: (!) 148/68   Pulse: 62 63  Resp: 18 (!) 25  Temp:    SpO2: 96% 94%    Last Pain:  Vitals:   11/24/16 1027  TempSrc:   PainSc: 8                  Nayra Coury

## 2016-11-24 NOTE — H&P (Signed)
Date of admission: 11/16/2016 Chief complaint: Subdural hygroma, pseudomeningocele, status post decompression for Chiari malformation, communicating hydrocephalus. History of present illness: Cassidy Tran is a 41 year old individual who had a developing Chiari malformation that been followed for 3 years time. She underwent suboccipital decompression approximately 3 weeks ago. She's had persistent headache and despite treatment with steroid medications is now developed a subdural hygroma. She has a pseudomeningocele that is involved in the posterior fossa also. Is felt that she is experiencing communicating hydrocephalus and will now have a ventriculoperitoneal shunt placed with stereotactic guidance. She has underlying von Willebrand's disease. This is being treated with Humate-P.  Past medical history: No changes from before Social history: No changes Family history: No changes: Physical exam: She is awake and alert her incision in the posterior suboccipital region is well-healed. Pupils are 3 mm briskly reactive light and accommodation documents are full face is symmetric to grimace tongue and uvula in the midline motor function is intact in the upper and lower extremities. Heart has a regular rate and rhythm no murmurs are heard Lungs are clear to auscultation Abdomen is soft bowel sounds positive no masses are noted Extremities reveal no cyanosis clubbing or edema.  Impression: Patient has Camino indicating hydrocephalus having had a suboccipital decompression for Chiari malformation. Ventriculoperitoneal shunt is now to be placed.

## 2016-11-24 NOTE — Op Note (Signed)
Date of surgery: 11/16/2016 Preoperative diagnosis: Communicating hydrocephalus, status post decompression for Chiari I malformation. Subdural hygroma, posterior fossa pseudomeningocele. Postoperative diagnosis: Same Procedure: Placement of right occipital ventriculoperitoneal shunt with use of image guidance. Surgeon: Kristeen Miss First assistant: Barrie Lyme M.D. Anesthesia: Gen. endotracheal Indications: Cassidy Tran is 41 year old individual who underwent suboccipital decompression for Chiari malformation proximally 3 weeks ago. In the interim she's had severe headaches and was noted to develop a small subdural hygroma in addition to a small pseudomeningocele posterior fossa. Incision is dry and well-healed. Because of the continued difficulties it is felt that placement of a ventriculoperitoneal catheter will alleviate communicating hydrocephalus.  Procedure: The patient was brought to the operating supine on a stretcher. After the smooth induction of general endotracheal anesthesia, she is placed in a 3 pin headrest with the head turned to the left side. The right parietal and occipital regions were shaved. Then the registration localizer was placed onto the frame and secured tightly. Registration of the patient's head was performed with a preoperative CT scan as the patient had small slitlike ventricles that were being instrumented with the ventricular catheter. Registration was completed and various random localizations on the surface were checked to verify accuracy. When we're comfortable with the accurac Kalp[place] right upper quadrant chest in neck were prepped with alcohol and DuraPrep and draped in a sterile fashion. Then the procedure was started by making a transverse incision in right upper quadrant the dissection was carried down to the rectus fascia which was opened and the rectus muscle was dissected the posterior rectus sheath was then opened and the peritoneum was identified. A  pursestring suture was placed in the posterior rectus sheath and the peritoneal tissue. Then a separate incision was made in the right parietal-occipital region over chosen entry site. A burr hole was drilled using an acorn bit and high-speed drill. The dura was cauterized. The dura was then opened in a cruciate fashion and the edges were cauterized. Then using the stereotactic wand with the ventricular catheter attached ventricular catheter was passed to the right occipital horn. Lexapro horn was entered and spinal fluid was allowed to flow. This was rapidly clamped and the catheter was advanced to 10 cm depth. Then the system was connected to the distal catheter which had been threaded subcutaneously from the right parietal-occipital incision down to the abdominal incision. A 20 pursestring suture was used to connect the catheter to the Codman adjustable shunt. The distal end was noted to flow freely area the distal end was then inserted into the peritoneum in a pursestring suture at that and was snugged around the catheter itself. The wounds were then closed with 2-0 Vicryl in interrupted fashion and the anterior rectus sheath 2-0 Vicryl subcutaneous anus tissues and 3-0 Vicryl subcutaneous take the scalp was closed with 2-0 Vicryl in the galea and surgical staples in the scalp. Dry sterile dressings were applied. Patient tolerated procedure well. Shunt pressure was set at 100 mm of water.

## 2016-11-24 NOTE — Transfer of Care (Signed)
Immediate Anesthesia Transfer of Care Note  Patient: Cassidy Tran  Procedure(s) Performed: Procedure(s) with comments: SHUNT INSERTION VENTRICULAR-PERITONEAL with Brainlab (N/A) - right side approach APPLICATION OF CRANIAL NAVIGATION (N/A)  Patient Location: PACU  Anesthesia Type:General  Level of Consciousness: awake, alert  and oriented  Airway & Oxygen Therapy: Patient Spontanous Breathing and Patient connected to nasal cannula oxygen  Post-op Assessment: Report given to RN and Post -op Vital signs reviewed and stable  Post vital signs: Reviewed and stable  Last Vitals:  Vitals:   11/24/16 0547  BP: (!) 139/52  Pulse: (!) 57  Resp: 17  Temp: 36.9 C  SpO2: 97%    Last Pain:  Vitals:   11/24/16 0652  TempSrc:   PainSc: 9       Patients Stated Pain Goal: 2 (75/79/72 8206)  Complications: No apparent anesthesia complications

## 2016-11-24 NOTE — Anesthesia Procedure Notes (Signed)
Procedure Name: Intubation Date/Time: 11/24/2016 8:09 AM Performed by: Clearnce Sorrel Pre-anesthesia Checklist: Patient identified, Emergency Drugs available, Suction available, Timeout performed and Patient being monitored Patient Re-evaluated:Patient Re-evaluated prior to induction Oxygen Delivery Method: Circle system utilized Preoxygenation: Pre-oxygenation with 100% oxygen Induction Type: IV induction Ventilation: Mask ventilation without difficulty Laryngoscope Size: Glidescope and 3 (limited neck ROM) Grade View: Grade I Tube type: Oral Tube size: 7.0 mm Number of attempts: 1 Airway Equipment and Method: Stylet Placement Confirmation: ETT inserted through vocal cords under direct vision,  positive ETCO2 and breath sounds checked- equal and bilateral Secured at: 22 cm Tube secured with: Tape Dental Injury: Teeth and Oropharynx as per pre-operative assessment

## 2016-11-24 NOTE — Progress Notes (Signed)
Hematology: Patient well-known to me.  Type Ia von Willebrand's disease, adult onset asthma, and a seizure disorder.  She just had surgery for a type I Chiari malformation on September 10.  Discharged on September 17.  She was covered with Humate-P factor VIII plus von Willebrand clotting factor concentrate for a total of 7 days.  She had no signs of abnormal bleeding.  She developed headaches and was evaluated on September 25.  She has developed communicating hydrocephalus.  She was readmitted today and underwent a ventriculoperitoneal shunt placement. I saw the patient preop and again post op. She tolerated the procedure well. Current exam: Blood pressure 136/74, pulse 67, temperature 98.1 F (36.7 C), resp. rate 14, height 5\' 3"  (1.6 m), weight 195 lb (88.5 kg), last menstrual period 09/16/2012, SpO2 99 %. She is awake, alert, oriented x3.  Pupils equal round reactive to light.  Fluent speech.  No facial asymmetry.  Palate elevates symmetrically.  Motor strength 5/5 upper and lower extremities. Lungs are clear.  Regular cardiac rhythm no murmur. Lab: Pro time 12.1 seconds, PTT 29 seconds done preop.  Impression: Von Willebrand's disorder requiring brain surgery  Recommendation: She will again require clotting factor replacement for 7 days.  Half-life of the product is approximately 8 hours.  She will receive 4 doses at 8-hour intervals, 3 doses at 12-hour intervals, then 1 dose at 24-hour interval to complete 7 days. She needs to stay in the hospital until we taper to the 24-hour daily dosing.  I have made arrangements for her to complete treatment as an outpatient at the [facility] short stay unit beginning at 9 AM on Tuesday, October 2.  [name], MD, [place]  Hematology-Oncology/Internal Medicine [phone]

## 2016-11-25 ENCOUNTER — Inpatient Hospital Stay (HOSPITAL_COMMUNITY): Payer: BLUE CROSS/BLUE SHIELD

## 2016-11-25 LAB — CBC WITH DIFFERENTIAL/PLATELET
BASOS ABS: 0 10*3/uL (ref 0.0–0.1)
Basophils Relative: 0 %
EOS ABS: 0 10*3/uL (ref 0.0–0.7)
EOS PCT: 0 %
HCT: 36.5 % (ref 36.0–46.0)
Hemoglobin: 11.7 g/dL — ABNORMAL LOW (ref 12.0–15.0)
LYMPHS PCT: 17 %
Lymphs Abs: 3.6 10*3/uL (ref 0.7–4.0)
MCH: 29.6 pg (ref 26.0–34.0)
MCHC: 32.1 g/dL (ref 30.0–36.0)
MCV: 92.4 fL (ref 78.0–100.0)
MONO ABS: 1.4 10*3/uL — AB (ref 0.1–1.0)
Monocytes Relative: 7 %
Neutro Abs: 15.8 10*3/uL — ABNORMAL HIGH (ref 1.7–7.7)
Neutrophils Relative %: 76 %
PLATELETS: 401 10*3/uL — AB (ref 150–400)
RBC: 3.95 MIL/uL (ref 3.87–5.11)
RDW: 16.6 % — AB (ref 11.5–15.5)
WBC: 20.7 10*3/uL — ABNORMAL HIGH (ref 4.0–10.5)

## 2016-11-25 LAB — APTT: APTT: 26 s (ref 24–36)

## 2016-11-25 NOTE — Progress Notes (Signed)
Vital signs stable CT head shows catheter in good position Motor function good but patient c/o ha receiving Humate for VW Transfer to floor

## 2016-11-26 ENCOUNTER — Encounter (HOSPITAL_COMMUNITY): Payer: Self-pay | Admitting: Neurological Surgery

## 2016-11-26 LAB — CBC WITH DIFFERENTIAL/PLATELET
BASOS ABS: 0 10*3/uL (ref 0.0–0.1)
Basophils Relative: 0 %
Eosinophils Absolute: 0.1 10*3/uL (ref 0.0–0.7)
Eosinophils Relative: 0 %
HEMATOCRIT: 39.8 % (ref 36.0–46.0)
HEMOGLOBIN: 12.9 g/dL (ref 12.0–15.0)
LYMPHS PCT: 27 %
Lymphs Abs: 4.8 10*3/uL — ABNORMAL HIGH (ref 0.7–4.0)
MCH: 30 pg (ref 26.0–34.0)
MCHC: 32.4 g/dL (ref 30.0–36.0)
MCV: 92.6 fL (ref 78.0–100.0)
MONO ABS: 1.1 10*3/uL — AB (ref 0.1–1.0)
MONOS PCT: 6 %
NEUTROS ABS: 11.8 10*3/uL — AB (ref 1.7–7.7)
NEUTROS PCT: 67 %
Platelets: 417 10*3/uL — ABNORMAL HIGH (ref 150–400)
RBC: 4.3 MIL/uL (ref 3.87–5.11)
RDW: 16.6 % — AB (ref 11.5–15.5)
WBC: 17.8 10*3/uL — ABNORMAL HIGH (ref 4.0–10.5)

## 2016-11-26 LAB — VANCOMYCIN, TROUGH: Vancomycin Tr: 13 ug/mL — ABNORMAL LOW (ref 15–20)

## 2016-11-26 LAB — APTT: APTT: 27 s (ref 24–36)

## 2016-11-26 MED ORDER — PANTOPRAZOLE SODIUM 40 MG PO TBEC
40.0000 mg | DELAYED_RELEASE_TABLET | Freq: Every day | ORAL | Status: DC
  Administered 2016-11-26: 40 mg via ORAL
  Filled 2016-11-26: qty 1

## 2016-11-26 NOTE — Progress Notes (Signed)
PHARMACIST - PHYSICIAN COMMUNICATION  CONCERNING: IV to Oral Route Change Policy  RECOMMENDATION: This patient is receiving pantoprazole by the intravenous route.  Based on criteria approved by the Pharmacy and Therapeutics Committee, the intravenous medication(s) is/are being converted to the equivalent oral dose form(s).   DESCRIPTION: These criteria include:  The patient is eating (either orally or via tube) and/or has been taking other orally administered medications for a least 24 hours  The patient has no evidence of active gastrointestinal bleeding or impaired GI absorption (gastrectomy, short bowel, patient on TNA or NPO).  If you have questions about this conversion, please contact the Pharmacy Department  []   [phone] )  [facility] []   [phone] )  [facility] [x]   [phone] )  [facility] []   [phone] )  [facility] []   [phone] )  [place]. Gerarda Fraction, PharmD PGY1 Pharmacy Resident Pager: [phone]

## 2016-11-26 NOTE — Progress Notes (Signed)
Pharmacy just called to notify this nurse that medication Humate available for pickup for administration. IV team consult order placed to administer.

## 2016-11-26 NOTE — Progress Notes (Signed)
Pharmacy contacted for Humate.

## 2016-11-26 NOTE — Progress Notes (Signed)
Neurosurgery Progress Note  No issues overnight.  No concerns this am with exception of minimal headache.  EXAM:  BP (!) 132/57 (BP Location: Right Arm)   Pulse 66   Temp 98.1 F (36.7 C) (Oral)   Resp 18   Ht 5\' 3"  (1.6 m)   Wt 88.5 kg (195 lb)   LMP 09/16/2012   SpO2 98%   BMI 34.54 kg/m   Awake, alert, oriented  Speech fluent, appropriate  CN grossly intact  Incision c/d/i  PLAN Stable. Continue current care. Per hematology, patient has arrangements to complete treatment for VW as outpt at [facility] short stay unit beginning 9am Tuesday October 2.

## 2016-11-26 NOTE — Progress Notes (Signed)
Pharmacy Antibiotic Note  Cassidy Tran is a 41 y.o. female admitted on 11/24/2016 for intraventricular shunt with application of cranial navigation.  Pharmacy has been consulted for vancomycin dosing for surgical prophylaxis in patient with an intraventricular shunt.    Vanc trough drawn this AM is 50mcg/mL (goal ~15). Scr stable 0.61, est CrCl ~38mL/min.  Plan: Continue vancomycin 1gm IV q8h  Goal trough ~15 mcg/mL Monitor renal fxn, clinical progress, repeat vanc trough as indicated   Height: 5\' 3"  (160 cm) Weight: 195 lb (88.5 kg) IBW/kg (Calculated) : 52.4  Temp (24hrs), Avg:98.4 F (36.9 C), Min:98.1 F (36.7 C), Max:98.6 F (37 C)   Recent Labs Lab 11/21/16 0110 11/24/16 0620 11/25/16 0501 11/26/16 0710  WBC 21.7*  --  20.7* 17.8*  CREATININE 0.68 0.61  --   --   VANCOTROUGH  --   --   --  13*    Estimated Creatinine Clearance: 96.6 mL/min (by C-G formula based on SCr of 0.61 mg/dL).    Allergies  Allergen Reactions  . Aspirin Other (See Comments)    Childhood reaction  . Codeine Nausea And Vomiting    Tolerates Hydrocodone  . Erythromycin Other (See Comments)    Childhood reaction, tolerate Zpak  . Lactose Intolerance (Gi) Diarrhea  . Nsaids Nausea And Vomiting  . Penicillins Other (See Comments)    Has patient had a PCN reaction causing immediate rash, facial/tongue/throat swelling, SOB or lightheadedness with hypotension: doesn't remember childhood Reaction  Has patient had a PCN reaction causing severe rash involving mucus membranes or skin necrosis: NO Has patient had a PCN reaction that required hospitalization NO Has patient had a PCN reaction occurring within the last 10 years: NO If all of the above answers are "NO", then may proceed with Cephalosporin use.     Antimicrobials this admission: Vancomycin 9/28 >>   Dose adjustments this admission: None  Microbiology results: None   Erin N. Gerarda Fraction, PharmD PGY1 Pharmacy Resident Pager:  [phone]

## 2016-11-27 DIAGNOSIS — D66 Hereditary factor VIII deficiency: Secondary | ICD-10-CM

## 2016-11-27 DIAGNOSIS — Z9889 Other specified postprocedural states: Secondary | ICD-10-CM

## 2016-11-27 LAB — CBC WITH DIFFERENTIAL/PLATELET
BASOS ABS: 0 10*3/uL (ref 0.0–0.1)
Basophils Relative: 0 %
EOS ABS: 0 10*3/uL (ref 0.0–0.7)
EOS PCT: 0 %
HCT: 39.1 % (ref 36.0–46.0)
Hemoglobin: 12.4 g/dL (ref 12.0–15.0)
Lymphocytes Relative: 23 %
Lymphs Abs: 3.1 10*3/uL (ref 0.7–4.0)
MCH: 29.2 pg (ref 26.0–34.0)
MCHC: 31.7 g/dL (ref 30.0–36.0)
MCV: 92.2 fL (ref 78.0–100.0)
MONO ABS: 0.6 10*3/uL (ref 0.1–1.0)
Monocytes Relative: 5 %
Neutro Abs: 9.4 10*3/uL — ABNORMAL HIGH (ref 1.7–7.7)
Neutrophils Relative %: 72 %
PLATELETS: 406 10*3/uL — AB (ref 150–400)
RBC: 4.24 MIL/uL (ref 3.87–5.11)
RDW: 16.5 % — AB (ref 11.5–15.5)
WBC: 13.2 10*3/uL — AB (ref 4.0–10.5)

## 2016-11-27 LAB — FACTOR VIII (NCBH)

## 2016-11-27 LAB — APTT: APTT: 25 s (ref 24–36)

## 2016-11-27 MED ORDER — OXYCODONE-ACETAMINOPHEN 7.5-325 MG PO TABS: 1.0000 | ORAL_TABLET | Freq: Four times a day (QID) | ORAL | 0 refills | Status: DC | PRN

## 2016-11-27 MED ORDER — DEXAMETHASONE 0.5 MG PO TABS: ORAL_TABLET | ORAL | 0 refills | Status: DC

## 2016-11-27 MED ORDER — BUTALBITAL-APAP-CAFFEINE 50-325-40 MG PO TABS: 1.0000 | ORAL_TABLET | ORAL | 0 refills | Status: DC | PRN

## 2016-11-27 MED FILL — Antihemophilic Factor/VWF (Human) For Inj 250-600 Unit: INTRAVENOUS | Qty: 5034 | Status: AC

## 2016-11-27 MED FILL — Antihemophilic Factor/VWF (Human) For Inj 250-600 Unit: INTRAVENOUS | Qty: 5139 | Status: AC

## 2016-11-27 MED FILL — Antihemophilic Factor/VWF (Human) For Inj 250-600 Unit: INTRAVENOUS | Qty: 5497 | Status: AC

## 2016-11-27 NOTE — Progress Notes (Signed)
Hematology addendum: Factor VIII level this morning 350% of control.  We can skip the 1930 PM dose of Humate-P tonight.  Resume remaining doses as an outpatient tomorrow.

## 2016-11-27 NOTE — Progress Notes (Signed)
Pt discharged from hospital per orders from MD. Pt and family educated on discharge instructions. Pt and family verbalized understanding of instructions. All questions and concerns were addressed. Pt's IV was removed prior to discharge. Pt exited hospital via wheelchair.

## 2016-11-27 NOTE — Care Management Note (Signed)
Case Management Note  Patient Details  Name: Cassidy Tran MRN: [id number] Date of Birth: [birth year]  Subjective/Objective:                    Action/Plan: Pt discharging home with self care. Pt has PCP, insurance and transportation home. No further needs per CM.  Expected Discharge Date:  11/27/16               Expected Discharge Plan:  Home/Self Care  In-House Referral:     Discharge planning Services     Post Acute Care Choice:    Choice offered to:     DME Arranged:    DME Agency:     HH Arranged:    HH Agency:     Status of Service:  Completed, signed off  If discussed at [employer] of Stay Meetings, dates discussed:    Additional Comments:  Pollie Friar, RN 11/27/2016, 1:24 PM

## 2016-11-27 NOTE — Care Management Note (Signed)
Case Management Note  Patient Details  Name: Cassidy Tran MRN: [id number] Date of Birth: 01/27/1976  Subjective/Objective:  Pt readmitted for VP shunt replacement. She is from home with her spouse.                  Action/Plan: Plan is for patient to return home when medically stable and f/u with [facility] short stay for factor replacement. CM following.   Expected Discharge Date:  11/27/16               Expected Discharge Plan:  Home/Self Care  In-House Referral:     Discharge planning Services     Post Acute Care Choice:    Choice offered to:     DME Arranged:    DME Agency:     HH Arranged:    HH Agency:     Status of Service:  In process, will continue to follow  If discussed at Long Length of Stay Meetings, dates discussed:    Additional Comments:  Pollie Friar, RN 11/27/2016, 11:48 AM

## 2016-11-27 NOTE — Discharge Summary (Signed)
dateof admission:11/24/2016 Date of discharge: 11/27/2016 Admitting diagnosis: Status post Chiari decompression of posterior fossa.Pseudomeningocele subdural hygroma, communicating hydrocephalus.von Willebrand's disease Discharge and final diagnosis: Status post Chiari decompression of posterior fossa. Pseudomeningocele, subdural hygroma, communicating hydrocephalus.von Willebrand's disease Condition on discharge: Improved  Hospital course: Patient was admitted to undergo elective placemet of a ventriculoperitoneal shunt that she developed significant subdural hygromas a pseudomeningocele fter surgery.She tolerated shunt placement well. She is been hospitalized to receive adequate doses ofHumate-P. She is now completed these doses  Her incision is clean and dry. She is ambulatory. Discharge medications:Percocet 7.5/325 #30 Butalbital/acetaminophen. #30 no refills Decadron 0.5 mg taper

## 2016-11-27 NOTE — Progress Notes (Signed)
Hematology Doing well. Alert, oriented, no focal deficits. Minimal wheezes, lungs, chronic. Follow up CT brain stable 2 d post op. Hb stable PTT remains in normal range on ongoing factor replacement. Factor VIII level 187% on Saturday. Likely home today. Appt made at [facility] short stay starting at 9 AM Tues 10/2 to continue daily factor replacement to complete 7 days total.

## 2016-11-28 ENCOUNTER — Other Ambulatory Visit: Payer: Self-pay | Admitting: Oncology

## 2016-11-28 ENCOUNTER — Encounter (HOSPITAL_COMMUNITY)
Admit: 2016-11-28 | Discharge: 2016-11-28 | Disposition: A | Discharge status: Home / Self Care | Payer: BLUE CROSS/BLUE SHIELD | Source: Ambulatory Visit | Attending: Oncology | Admitting: Oncology

## 2016-11-28 DIAGNOSIS — D680 Von Willebrand's disease: Secondary | ICD-10-CM | POA: Diagnosis not present

## 2016-11-28 DIAGNOSIS — D6801 Von willebrand disease, type 1: Secondary | ICD-10-CM

## 2016-11-28 LAB — FACTOR VIII (NCBH)

## 2016-11-28 LAB — APTT: APTT: 26 s (ref 24–36)

## 2016-11-28 MED ORDER — ANTIHEMOPHILIC FACTOR-VWF 250-600 UNITS IV SOLR
5139.0000 [IU] | INTRAVENOUS | Status: DC
  Administered 2016-11-28: 5139 [IU] via INTRAVENOUS
  Filled 2016-11-28: qty 5139

## 2016-11-29 ENCOUNTER — Encounter (HOSPITAL_COMMUNITY)
Admission: RE | Admit: 2016-11-29 | Discharge: 2016-11-29 | Disposition: A | Discharge status: Home / Self Care | Payer: BLUE CROSS/BLUE SHIELD | Source: Ambulatory Visit | Attending: Oncology | Admitting: Oncology

## 2016-11-29 DIAGNOSIS — D680 Von Willebrand's disease: Secondary | ICD-10-CM | POA: Diagnosis not present

## 2016-11-29 DIAGNOSIS — D6801 Von willebrand disease, type 1: Secondary | ICD-10-CM

## 2016-11-29 MED ORDER — ANTIHEMOPHILIC FACTOR-VWF 250-600 UNITS IV SOLR
5187.0000 [IU] | INTRAVENOUS | Status: DC
  Administered 2016-11-29: 5187 [IU] via INTRAVENOUS
  Filled 2016-11-29 (×2): qty 5187

## 2016-11-30 ENCOUNTER — Encounter (HOSPITAL_COMMUNITY)
Admit: 2016-11-30 | Discharge: 2016-11-30 | Disposition: A | Discharge status: Home / Self Care | Payer: BLUE CROSS/BLUE SHIELD | Attending: Oncology | Admitting: Oncology

## 2016-11-30 DIAGNOSIS — D680 Von Willebrand's disease: Secondary | ICD-10-CM | POA: Diagnosis not present

## 2016-11-30 DIAGNOSIS — D6801 Von willebrand disease, type 1: Secondary | ICD-10-CM

## 2016-11-30 MED ORDER — ANTIHEMOPHILIC FACTOR-VWF 250-600 UNITS IV SOLR
5034.0000 [IU] | INTRAVENOUS | Status: AC
  Administered 2016-11-30: 5034 [IU] via INTRAVENOUS
  Filled 2016-11-30: qty 5034

## 2017-01-01 ENCOUNTER — Ambulatory Visit: Payer: BLUE CROSS/BLUE SHIELD | Admitting: Medical

## 2017-01-01 ENCOUNTER — Encounter: Payer: Self-pay | Admitting: Medical

## 2017-01-01 ENCOUNTER — Ambulatory Visit
Admission: RE | Admit: 2017-01-01 | Discharge: 2017-01-01 | Disposition: A | Discharge status: Home / Self Care | Payer: BLUE CROSS/BLUE SHIELD | Source: Ambulatory Visit | Attending: Medical | Admitting: Medical

## 2017-01-01 ENCOUNTER — Telehealth: Payer: Self-pay | Admitting: Medical

## 2017-01-01 VITALS — BP 150/84 | HR 114 | Temp 98.2°F | Wt 194.6 lb

## 2017-01-01 DIAGNOSIS — R52 Pain, unspecified: Secondary | ICD-10-CM

## 2017-01-01 DIAGNOSIS — J4531 Mild persistent asthma with (acute) exacerbation: Secondary | ICD-10-CM

## 2017-01-01 DIAGNOSIS — Z9889 Other specified postprocedural states: Secondary | ICD-10-CM | POA: Diagnosis not present

## 2017-01-01 DIAGNOSIS — R05 Cough: Secondary | ICD-10-CM | POA: Diagnosis not present

## 2017-01-01 DIAGNOSIS — R059 Cough, unspecified: Secondary | ICD-10-CM

## 2017-01-01 DIAGNOSIS — J029 Acute pharyngitis, unspecified: Secondary | ICD-10-CM

## 2017-01-01 DIAGNOSIS — R062 Wheezing: Secondary | ICD-10-CM

## 2017-01-01 LAB — POCT RAPID STREP A (OFFICE): RAPID STREP A SCREEN: NEGATIVE

## 2017-01-01 MED ORDER — ALBUTEROL SULFATE HFA 108 (90 BASE) MCG/ACT IN AERS: INHALATION_SPRAY | RESPIRATORY_TRACT | 0 refills | Status: DC

## 2017-01-01 MED ORDER — PREDNISONE 10 MG PO TABS: ORAL_TABLET | ORAL | 0 refills | Status: DC

## 2017-01-01 NOTE — Telephone Encounter (Signed)
-----   Message from Carlena Hurl, PA-C sent at 01/01/2017 10:50 AM EST ----- No sign of pneumonia  Use rest, hydration, albuterol inhaler and can use the prednisone if desired.  If worse or not improving in the next 48 hours, call or return.

## 2017-01-01 NOTE — Telephone Encounter (Signed)
Pt informed of Xray results and instructions per Audelia Acton

## 2017-01-01 NOTE — Progress Notes (Signed)
Subjective: Chief Complaint  Patient presents with  . coughing , congestion    coughing, congestion , sorethroat ,bodyaches, chills   Here for feeling ill.  Started 3 days ago.   She notes body aches, sore throat, sneezing, cough, chills, productive cough, some wheezing.   No specific fever.  Has brain surgery last month for shunt, s/p pressure build up c/p surgery for chiari formation.   She was in ICU for a week.  She is  trying to keep from getting sick.   Went back to work 12/28/16 and there were several people sick.  She has hx/o inhaler, having to use inhaler a few times in recent days.  She notes ongoing neck soreness since her surgery, has discussed this with Dr. Ellene Route.  No other aggravating or relieving factors. No other complaint.  Past Medical History:  Diagnosis Date  . Allergy   . Anxiety   . Arthritis    lower back  . ASCUS (atypical squamous cells of undetermined significance) on Pap smear 03/2011   NEG HR HPV  . Asthma   . Cancer [facility])    skin cancer- age 66ish  . Cervical dysplasia, mild 08/2010   LGSIL colposcopy biopsy showing koilocytotic atypia  . Clotting disorder ([place])   . Depression   . Endometriosis   . Headache(784.0)   . High risk HPV infection 12/2011   Pap normal  . Pneumonia    2016ish  . Seizures ([place])    seizure disorder  . Smoker   . Von Willebrand disease ([place])    Current Outpatient Medications on File Prior to Visit  Medication Sig Dispense Refill  . acetaminophen (TYLENOL) 500 MG tablet Take 1,000 mg by mouth every 6 (six) hours as needed for mild pain or moderate pain. pain     . ADVAIR HFA 115-21 MCG/ACT inhaler INHALE 2 PUFFS BY MOUTH EVERY 12 HOURS TO PREVENT COUGH OR WHEEZING 12 g 1  . butalbital-acetaminophen-caffeine (FIORICET, ESGIC) 50-325-40 MG tablet Take 1 tablet by mouth every 4 (four) hours as needed for headache. 40 tablet 0  . divalproex (DEPAKOTE ER) 500 MG 24 hr tablet Take 1,000 mg by mouth 2 (two) times daily.    .  meclizine (ANTIVERT) 25 MG tablet TAKE 1 TABLET BY MOUTH THREE TIMES DAILY AS NEEDED FOR DIZZINESS 20 tablet 0  . zonisamide (ZONEGRAN) 100 MG capsule Take 100 mg by mouth 2 (two) times daily.     . butalbital-acetaminophen-caffeine (FIORICET, ESGIC) 50-325-40 MG tablet Take 1 tablet by mouth every 4 (four) hours as needed for headache. 30 tablet 0  . mometasone (NASONEX) 50 MCG/ACT nasal spray Use one to two sprays in each nostril once daily (Patient not taking: Reported on 01/01/2017) 17 g 5   Current Facility-Administered Medications on File Prior to Visit  Medication Dose Route Frequency Provider Last Rate Last Dose  . desmopressin (DDAVP) injection 34.04 mcg  0.3 mcg/kg (Order-Specific) Intravenous Once Fontaine, Timothy P, MD        ROS as in subjective   Objective: BP (!) 150/84   Pulse (!) 114   Temp 98.2 F (36.8 C)   Wt 194 lb 9.6 oz (88.3 kg)   LMP 09/16/2012   SpO2 94%   BMI 34.47 kg/m   General: Ill-appearing, well-developed, well-nourished Skin: warm, dry HEENT: Nose inflamed and congested, clear conjunctiva, TMs pearly, no sinus tenderness, pharynx with erythema, no exudates Neck: mild generalized tenderness, relatively normal ROM, but no mass, no lymphadenopathy Heart: mildly tachycardia,  otherwise regular rate and rhythm, normal S1, S2, no murmurs Lungs: few rhonchi, few faint wheezes, no rales Extremities: no edema, no tenderness, no discoloration, no asymmetry, -homans Pulses WNL    Assessment: Encounter Diagnoses  Name Primary?  . Cough Yes  . Sore throat   . Mild persistent asthma with exacerbation   . Wheezing   . Body aches   . Recent major surgery     Plan: Flu and strep negative.  Will send for chest xray.  Reviewed recent hospitalization discharge summary and reports.    Currently symptoms suggest viral respiratory infection with mild asthma exacerbation.  Will send for CXR.  Advised rest, hydration, albuterol inhaler 2 puffs every 4-6  hours, can use Mucinex DM OTC.   Can use prednisone for asthma flare up.     F/u with chest xray results.  Discussed case with Dr. Redmond School supervising physical regarding neck soreness.   Likely worse with the body aches and viral process today.  Advised rest, hydration, and will re-assess in 2-3 days    Sumaya was seen today for coughing , congestion.  Diagnoses and all orders for this visit:  Cough -     DG Chest 2 View; Future  Sore throat -     Rapid Strep A -     DG Chest 2 View; Future  Mild persistent asthma with exacerbation -     DG Chest 2 View; Future  Wheezing -     DG Chest 2 View; Future  Body aches -     DG Chest 2 View; Future  Recent major surgery -     DG Chest 2 View; Future  Other orders -     albuterol (PROAIR HFA) 108 (90 Base) MCG/ACT inhaler; INHALE 2 PUFFS BY MOUTH EVERY 4 TO 6 HOURS AS NEEDED FOR COUGH OR WHEEZING -     predniSONE (DELTASONE) 10 MG tablet; 6/5/4/3/2/1 taper

## 2017-01-02 ENCOUNTER — Other Ambulatory Visit: Payer: Self-pay | Admitting: Allergy and Immunology

## 2017-01-04 ENCOUNTER — Encounter: Payer: Self-pay | Admitting: Gynecology

## 2017-01-04 ENCOUNTER — Ambulatory Visit (INDEPENDENT_AMBULATORY_CARE_PROVIDER_SITE_OTHER): Payer: BLUE CROSS/BLUE SHIELD | Admitting: Gynecology

## 2017-01-04 VITALS — BP 122/78 | Ht 64.0 in | Wt 194.0 lb

## 2017-01-04 DIAGNOSIS — R102 Pelvic and perineal pain: Secondary | ICD-10-CM

## 2017-01-04 DIAGNOSIS — Z01419 Encounter for gynecological examination (general) (routine) without abnormal findings: Secondary | ICD-10-CM | POA: Diagnosis not present

## 2017-01-04 DIAGNOSIS — Z13220 Encounter for screening for lipoid disorders: Secondary | ICD-10-CM | POA: Diagnosis not present

## 2017-01-04 LAB — LIPID PANEL
CHOL/HDL RATIO: 4.3 (calc) (ref <5.0)
Cholesterol: 247 mg/dL — ABNORMAL HIGH (ref <200)
HDL: 57 mg/dL (ref >50)
LDL CHOLESTEROL (CALC): 161 mg/dL — AB
NON-HDL CHOLESTEROL (CALC): 190 mg/dL — AB (ref <130)
Triglycerides: 149 mg/dL (ref <150)

## 2017-01-04 NOTE — Patient Instructions (Signed)
Call if your pelvic pain continues and we will schedule an ultrasound.  Follow-up in 1 year for annual exam

## 2017-01-04 NOTE — Addendum Note (Signed)
Addended by: Joaquin Music on: 01/04/2017 09:52 AM   Modules accepted: Orders

## 2017-01-04 NOTE — Addendum Note (Signed)
Addended by: Nelva Nay on: 01/04/2017 08:55 AM   Modules accepted: Orders

## 2017-01-04 NOTE — Progress Notes (Signed)
    Cassidy Tran 26-Jan-1976 [id number]        41 y.o.  G0P0 for annual gynecologic exam.  Notes over the last month or so some fleeting pelvic pain that comes and goes.  Sharp stabbing aching on a daily basis.  Correlates this when her Foley catheter was removed when she was hospitalized.  Has had a significant year to include cranial surgery and a cranial shunt.  Is doing well from that standpoint.  No nausea vomiting diarrhea constipation.  No urinary symptoms as far as dysuria urgency frequency fever or chills.  Does have a history of endometriosis that is post TLH where endometriosis was noted at the dome of the bladder.  Past medical history,surgical history, problem list, medications, allergies, family history and social history were all reviewed and documented as reviewed in the EPIC chart.  ROS:  Performed with pertinent positives and negatives included in the history, assessment and plan.   Additional significant findings : None   Exam: [name] assistant Vitals:   01/04/17 0805  BP: 122/78  Weight: 194 lb (88 kg)  Height: 5\' 4"  (1.626 m)   Body mass index is 33.3 kg/m.  General appearance:  Normal affect, orientation and appearance. Skin: Grossly normal HEENT: Without gross lesions.  No cervical or supraclavicular adenopathy. Thyroid normal.  Lungs:  Clear without wheezing, rales or rhonchi Cardiac: RR, without RMG Abdominal:  Soft, nontender, without masses, guarding, rebound, organomegaly or hernia Breasts:  Examined lying and sitting without masses, retractions, discharge or axillary adenopathy. Pelvic:  Ext, BUS, Vagina: Normal.  Pap smear of cuff done  Adnexa: Without masses or tenderness    Anus and perineum: Normal   Rectovaginal: Normal sphincter tone without palpated masses or tenderness.    Assessment/Plan:  41 y.o. G0P0 female for annual gynecologic exam status post TLH for endometriosis.   1. Pelvic pain.  Seems to be getting better.  Onset  correlated with the removal of her Foley catheter.  History of endometriosis to include dome of the bladder.  Check urine analysis today.  Since her pain is improving and her exam is normal we will monitor for now.  If pain totally resolves then will follow.  If persists she knows to call will start with ultrasound to rule out ovarian endometrioma or other pathology.  Patient comfortable with this approach. 2. Pap smear 2016.  Pap smear done today.  History of LGSIL 2012.  ASCUS positive high risk HPV 2013.  Normal Pap smears since with a negative HPV 2014. 3. Mammography 08/2016.  Continue with annual mammography next year.  Breast exam normal today. 4. Health maintenance.  Baseline lipid profile today.  Has had CBCs and CMP's over the past year.  Follow-up in 1 year, sooner if her pain persists.   [name] MD, 8:26 AM 01/04/2017

## 2017-01-08 LAB — PAP IG W/ RFLX HPV ASCU

## 2017-01-09 ENCOUNTER — Telehealth: Payer: Self-pay | Admitting: Medical

## 2017-01-09 MED ORDER — LEVOFLOXACIN 500 MG PO TABS: 500.0000 mg | ORAL_TABLET | Freq: Every day | ORAL | 0 refills | Status: DC

## 2017-01-09 NOTE — Telephone Encounter (Signed)
Pt called and stated that she saw you on the 5th. She is still congested and still coughing. Pt uses [facility] on [address] and can be reached at [phone]. Please advise pt.

## 2017-01-09 NOTE — Telephone Encounter (Signed)
Spoke with patients, you can hear her breathing on phone, I asked her if she had asthma and she said yes.  Prednisone did not help.    Lazaro Arms, he said call out Levaquin 500 mg 1 po qd #7 no refills and return for ov Wednesday.  Patient will be in first thing in the morning.  Called out rx to pharmacy

## 2017-01-10 ENCOUNTER — Encounter: Payer: Self-pay | Admitting: Medical

## 2017-01-10 ENCOUNTER — Other Ambulatory Visit: Payer: Self-pay | Admitting: Medical

## 2017-01-10 ENCOUNTER — Ambulatory Visit: Payer: BLUE CROSS/BLUE SHIELD | Admitting: Medical

## 2017-01-10 VITALS — BP 128/72 | HR 99 | Temp 98.1°F | Wt 193.6 lb

## 2017-01-10 DIAGNOSIS — R05 Cough: Secondary | ICD-10-CM | POA: Diagnosis not present

## 2017-01-10 DIAGNOSIS — J988 Other specified respiratory disorders: Secondary | ICD-10-CM | POA: Diagnosis not present

## 2017-01-10 DIAGNOSIS — J4541 Moderate persistent asthma with (acute) exacerbation: Secondary | ICD-10-CM | POA: Diagnosis not present

## 2017-01-10 DIAGNOSIS — R059 Cough, unspecified: Secondary | ICD-10-CM

## 2017-01-10 MED ORDER — ALBUTEROL SULFATE (2.5 MG/3ML) 0.083% IN NEBU
2.5000 mg | INHALATION_SOLUTION | Freq: Once | RESPIRATORY_TRACT | Status: AC
  Administered 2017-01-10: 2.5 mg via RESPIRATORY_TRACT

## 2017-01-10 MED ORDER — METHYLPREDNISOLONE ACETATE 80 MG/ML IJ SUSP
80.0000 mg | Freq: Once | INTRAMUSCULAR | Status: AC
  Administered 2017-01-10: 80 mg via INTRAMUSCULAR

## 2017-01-10 MED ORDER — ALBUTEROL SULFATE (2.5 MG/3ML) 0.083% IN NEBU: 2.5000 mg | INHALATION_SOLUTION | Freq: Four times a day (QID) | RESPIRATORY_TRACT | 2 refills | Status: DC | PRN

## 2017-01-10 MED ORDER — BENZONATATE 200 MG PO CAPS: 200.0000 mg | ORAL_CAPSULE | Freq: Three times a day (TID) | ORAL | 0 refills | Status: DC | PRN

## 2017-01-10 NOTE — Addendum Note (Signed)
Addended by: Tyrone Apple on: 01/10/2017 11:45 AM   Modules accepted: Orders

## 2017-01-10 NOTE — Progress Notes (Addendum)
Subjective: Chief Complaint  Patient presents with  . coughing congestion    coughing congestion , x2 -3 weeks    Here for recheck.  I saw her 01/01/17 for initial visit for cough, feeling bad.   She came in that day with symptoms of feeling ill, body aches, sore throat, sneezing, cough, chills, productive cough, some wheezing. Had brain surgery last month for shunt, s/p pressure build up c/p surgery for chiari formation.   She was in ICU for a week.   Went back to work 12/28/16 and there were several people sick.    Since last visit she notes no real improvement.  Still a lot of coughing, tightness in chest, wheezing.   Using Advair 2 puffs BID as usual, using albuterol inhaler a lot.  She does not have a home nebulizer treatment.   No hx/o DVT/ PE, no hx/o clot disorder in family.  No other aggravating or relieving factors. No other complaint.  Past Medical History:  Diagnosis Date  . Allergy   . Anxiety   . Arthritis    lower back  . ASCUS (atypical squamous cells of undetermined significance) on Pap smear 03/2011   NEG HR HPV  . Asthma   . Cancer [facility])    skin cancer- age 4ish  . Cervical dysplasia, mild 08/2010   LGSIL colposcopy biopsy showing koilocytotic atypia  . Clotting disorder ([place])   . Depression   . Endometriosis   . Headache(784.0)   . High risk HPV infection 12/2011   Pap normal  . Pneumonia    2016ish  . Seizures ([place])    seizure disorder  . Smoker   . Von Willebrand disease ([place])    Current Outpatient Medications on File Prior to Visit  Medication Sig Dispense Refill  . acetaminophen (TYLENOL) 500 MG tablet Take 1,000 mg by mouth every 6 (six) hours as needed for mild pain or moderate pain. pain     . ADVAIR HFA 115-21 MCG/ACT inhaler INHALE 2 PUFFS BY MOUTH EVERY 12 HOURS TO PREVENT COUGH OR WHEEZING 12 g 1  . albuterol (PROAIR HFA) 108 (90 Base) MCG/ACT inhaler INHALE 2 PUFFS BY MOUTH EVERY 4 TO 6 HOURS AS NEEDED FOR COUGH OR WHEEZING 18 g 0  .  butalbital-acetaminophen-caffeine (FIORICET, ESGIC) 50-325-40 MG tablet Take 1 tablet by mouth every 4 (four) hours as needed for headache. 40 tablet 0  . divalproex (DEPAKOTE ER) 500 MG 24 hr tablet Take 1,000 mg by mouth 2 (two) times daily.    [name] levofloxacin (LEVAQUIN) 500 MG tablet Take 1 tablet (500 mg total) daily by mouth. 7 tablet 0  . meclizine (ANTIVERT) 25 MG tablet TAKE 1 TABLET BY MOUTH THREE TIMES DAILY AS NEEDED FOR DIZZINESS 20 tablet 0  . mometasone (NASONEX) 50 MCG/ACT nasal spray Use one to two sprays in each nostril once daily 17 g 5  . zonisamide (ZONEGRAN) 100 MG capsule Take 100 mg by mouth 2 (two) times daily.      Current Facility-Administered Medications on File Prior to Visit  Medication Dose Route Frequency Provider Last Rate Last Dose  . desmopressin (DDAVP) injection 34.04 mcg  0.3 mcg/kg (Order-Specific) Intravenous Once Fontaine, Belinda Block, MD        ROS as in subjective   Objective: BP 128/72   Pulse 99   Temp 98.1 F (36.7 C)   Wt 193 lb 9.6 oz (87.8 kg)   LMP 09/16/2012   SpO2 94%   BMI 33.23 kg/m  General: well-developed, well-nourished Skin: warm, dry HEENT: clear Neck: mild generalized tenderness, relatively normal ROM, but no mass, no lymphadenopathy Heart:  regular rate and rhythm, normal S1, S2, no murmurs Lungs: scattered wheezes, decreased breath sounds in general, no rales Extremities: no edema, no tenderness, no discoloration, no asymmetry, -homans Pulses WNL No calve pain swelling or abnormality   Assessment: Encounter Diagnoses  Name Primary?  . Moderate persistent asthma with exacerbation Yes  . Cough   . Respiratory tract infection       Plan: Discussed symptoms, exam findings, prior eval and treatment.  Discuss possible differential.   She notes in the fall having persistent asthma symptoms with a yearly repsiroaty illness.  discussed DVT/PE in the differential.   Gave round of albuterol nebulized therapy in  office.  Gave 80mg  Depo Medrol IM in office, sent home with home nebulizer set up.   Use albuterol q4 hours, tessalon Perles prn, rest, hydrate well.   Advised she call report within 48 hours to give me update.  If not 60% or better improved within 48 hours recheck here, with asthma doctor or ED.       Tien was seen today for coughing congestion.  Diagnoses and all orders for this visit:  Moderate persistent asthma with exacerbation  Cough  Respiratory tract infection  Other orders -     benzonatate (TESSALON) 200 MG capsule; Take 1 capsule (200 mg total) 3 (three) times daily as needed by mouth for cough. -     albuterol (PROVENTIL) (2.5 MG/3ML) 0.083% nebulizer solution; Take 3 mLs (2.5 mg total) every 6 (six) hours as needed by nebulization for wheezing or shortness of breath.

## 2017-01-15 ENCOUNTER — Telehealth: Payer: Self-pay | Admitting: *Deleted

## 2017-01-15 DIAGNOSIS — R102 Pelvic and perineal pain: Secondary | ICD-10-CM

## 2017-01-15 NOTE — Telephone Encounter (Signed)
Patient called to follow up regarding pelvic pain states pain is not getting any better. Per note on 01/04/17 "If persists she knows to call will start with ultrasound to rule out ovarian endometrioma or other pathology."  Pt aware front desk will call to schedule, order placed.

## 2017-01-24 ENCOUNTER — Ambulatory Visit: Payer: BLUE CROSS/BLUE SHIELD | Admitting: Gynecology

## 2017-01-24 ENCOUNTER — Other Ambulatory Visit: Payer: Self-pay | Admitting: Gynecology

## 2017-01-24 ENCOUNTER — Ambulatory Visit (INDEPENDENT_AMBULATORY_CARE_PROVIDER_SITE_OTHER): Payer: BLUE CROSS/BLUE SHIELD

## 2017-01-24 ENCOUNTER — Encounter: Payer: Self-pay | Admitting: Gynecology

## 2017-01-24 VITALS — BP 124/80

## 2017-01-24 DIAGNOSIS — R102 Pelvic and perineal pain: Secondary | ICD-10-CM

## 2017-01-24 DIAGNOSIS — R188 Other ascites: Secondary | ICD-10-CM

## 2017-01-24 NOTE — Patient Instructions (Signed)
Follow up for ultrasound as scheduled 

## 2017-01-24 NOTE — Progress Notes (Signed)
    Cassidy Tran Apr 22, 1975 [id number]        41 y.o.  G0P0 presents for  Ultrasound.  History of 1-2 months of fleeting pelvic pain that comes and goes.  Sharp stabbing aching on a daily basis.  Seems to have initiated with removal of her Foley catheter when she was hospitalized for cranial surgery.  Status post TLH in the past for endometriosis to include the dome of the bladder.  No urinary symptoms such as frequency dysuria urgency low back pain fever or chills.  No nausea vomiting diarrhea constipation.  Past medical history,surgical history, problem list, medications, allergies, family history and social history were all reviewed and documented in the EPIC chart.  Directed ROS with pertinent positives and negatives documented in the history of present illness/assessment and plan.  Exam: Vitals:   01/24/17 1056  BP: 124/80   General appearance:  Normal  Ultrasound transvaginal status post hysterectomy shows right ovary with thin-walled echo-free follicular cysts 19 x 11 mm and 14 x 9 mm.  Left ovary normal.  Moderate amount of free fluid in the cul-de-sac 59 x 29 x 21 mm.  Free floating echogenic fine septum noted consistent with peritoneal adhesions/inclusion cyst.  Color flow Doppler negative.  Assessment/Plan:  41 y.o. G0P0 with ill-defined lower pelvic pain.  Ultrasound shows normal-appearing ovaries without evidence of endometriomas or other pathology.  Cul-de-sac does have fluid with questionable inclusion cyst/adhesions.  I do not think this is accounting for her pain.  Reviewed GYN versus non-GYN possibilities.  At this point I have recommended repeat ultrasound in 2 months and will relook at the cul-de-sac and her ovaries.  She understands more subtle endometriosis will go undetected with ultrasounds.  Options for ovarian/endometriosis suppression with medications such as Depo-Lupron or Orilillsa.  Ultimate possibility for surgery either diagnostic laparoscopy or  salpingo-oophorectomy also discussed.  At this point patient is comfortable with repeat ultrasound in 2 months.  She asked about pain medication and of suggested a nonsteroidal anti-inflammatory such as ibuprofen 600 800 mg as needed.  Patient will follow-up sooner if significant pain or other symptoms arise.    [name] MD, 11:15 AM 01/24/2017

## 2017-01-29 ENCOUNTER — Other Ambulatory Visit: Payer: Self-pay | Admitting: Gynecology

## 2017-01-29 DIAGNOSIS — Z1231 Encounter for screening mammogram for malignant neoplasm of breast: Secondary | ICD-10-CM

## 2017-02-01 ENCOUNTER — Other Ambulatory Visit: Payer: Self-pay | Admitting: Medical

## 2017-02-15 ENCOUNTER — Other Ambulatory Visit: Payer: Self-pay | Admitting: Medical

## 2017-02-16 ENCOUNTER — Other Ambulatory Visit: Payer: Self-pay | Admitting: Allergy

## 2017-02-16 ENCOUNTER — Other Ambulatory Visit: Payer: Self-pay

## 2017-02-16 MED ORDER — FLUTICASONE-SALMETEROL 115-21 MCG/ACT IN AERO: INHALATION_SPRAY | RESPIRATORY_TRACT | 0 refills | Status: DC

## 2017-02-16 MED ORDER — ALBUTEROL SULFATE HFA 108 (90 BASE) MCG/ACT IN AERS: INHALATION_SPRAY | RESPIRATORY_TRACT | 0 refills | Status: DC

## 2017-03-05 ENCOUNTER — Ambulatory Visit: Payer: BLUE CROSS/BLUE SHIELD | Admitting: Medical

## 2017-03-05 ENCOUNTER — Encounter: Payer: Self-pay | Admitting: Medical

## 2017-03-05 VITALS — BP 132/76 | HR 99 | Wt 191.6 lb

## 2017-03-05 DIAGNOSIS — E785 Hyperlipidemia, unspecified: Secondary | ICD-10-CM | POA: Insufficient documentation

## 2017-03-05 DIAGNOSIS — J4550 Severe persistent asthma, uncomplicated: Secondary | ICD-10-CM | POA: Diagnosis not present

## 2017-03-05 DIAGNOSIS — F17200 Nicotine dependence, unspecified, uncomplicated: Secondary | ICD-10-CM | POA: Diagnosis not present

## 2017-03-05 DIAGNOSIS — J0110 Acute frontal sinusitis, unspecified: Secondary | ICD-10-CM | POA: Insufficient documentation

## 2017-03-05 DIAGNOSIS — D6800 Von Willebrand disease, unspecified: Secondary | ICD-10-CM

## 2017-03-05 DIAGNOSIS — Z2820 Immunization not carried out because of patient decision for unspecified reason: Secondary | ICD-10-CM | POA: Diagnosis not present

## 2017-03-05 DIAGNOSIS — G935 Compression of brain: Secondary | ICD-10-CM | POA: Diagnosis not present

## 2017-03-05 DIAGNOSIS — G40909 Epilepsy, unspecified, not intractable, without status epilepticus: Secondary | ICD-10-CM | POA: Diagnosis not present

## 2017-03-05 DIAGNOSIS — J309 Allergic rhinitis, unspecified: Secondary | ICD-10-CM | POA: Diagnosis not present

## 2017-03-05 MED ORDER — DOXYCYCLINE HYCLATE 100 MG PO TABS: 100.0000 mg | ORAL_TABLET | Freq: Two times a day (BID) | ORAL | 0 refills | Status: DC

## 2017-03-05 MED ORDER — FLUTICASONE FUROATE-VILANTEROL 200-25 MCG/INH IN AEPB: 1.0000 | INHALATION_SPRAY | Freq: Every day | RESPIRATORY_TRACT | 0 refills | Status: DC

## 2017-03-05 MED ORDER — PROAIR HFA 108 (90 BASE) MCG/ACT IN AERS: 2.0000 | INHALATION_SPRAY | Freq: Four times a day (QID) | RESPIRATORY_TRACT | 1 refills | Status: DC | PRN

## 2017-03-05 MED ORDER — FLUTICASONE-SALMETEROL 115-21 MCG/ACT IN AERO: INHALATION_SPRAY | RESPIRATORY_TRACT | 5 refills | Status: DC

## 2017-03-05 NOTE — Progress Notes (Signed)
Subjective: Chief Complaint  Patient presents with  . Asthma    asthma follow up , sinus    Here for f/u on asthma.  She has a history significant for seizures, allergies, von willebrand, depression, anxiety, arthritis.  She began care here in December 2017 but has mainly come in for acute visits, has not been here yet for preventative care visit.  Medical team: Donalynn Furlong, MD gynecology Dr. [name], hematology Dr. Kristeen Miss, neurosurgery Dr. Joesph July, MD, neurology at [facility], Camelia Eng, PA-C here for primary care. Dr. Stefano Gaul, asthma and allergy  I last saw her 12/2016 for asthma flare up.  Here for recheck on asthma.  Of note, the conversation today was interrupted by the fact that she seemed obviously in a hurry, checked her watch about every other minute, so less than optimal visit today.  Current asthma medications include Advair for prevention, Albuterol HFA and neb home use for acute therapy.  Uses Nasonex when she remembers, meclizine when dizzy.  Uses albuterol 1-2 times daily depending upon the weather.   Usually sees Dr. Stefano Gaul a year ago, usually sees yearly.  Usually prefers Proair.  Has cut back some on smoking, however she does not seem to be motivated to quit smoking, no specific plan, and says when someone tells her to quit smoking she is not going to quit smoking.  "She does the opposite of what people tell her to do."  Lipid -looking back in her chart she had lipids done by her gynecologist back in November that were elevated.  Father was diagnosed with heart disease in 61s.    Having some sinus pressure, sinus headache, thick mucous last few weeks.   No sore throat, no fever, no NVD.    We have no vaccine hx/o on file.   Declines vaccines.    Past Medical History:  Diagnosis Date  . Allergy   . Anxiety   . Arthritis    lower back  . ASCUS (atypical squamous cells of undetermined significance) on Pap smear 03/2011   NEG HR HPV   . Asthma   . Cancer [facility])    skin cancer- age 12ish  . Cervical dysplasia, mild 08/2010   LGSIL colposcopy biopsy showing koilocytotic atypia  . Clotting disorder ([place])   . Depression   . Endometriosis   . Headache(784.0)   . High risk HPV infection 12/2011   Pap normal  . Pneumonia    2016ish  . Seizures ([place])    seizure disorder  . Smoker   . Von Willebrand disease ([place])    Current Outpatient Medications on File Prior to Visit  Medication Sig Dispense Refill  . acetaminophen (TYLENOL) 500 MG tablet Take 1,000 mg by mouth every 6 (six) hours as needed for mild pain or moderate pain. pain     . albuterol (PROVENTIL) (2.5 MG/3ML) 0.083% nebulizer solution Take 3 mLs (2.5 mg total) every 6 (six) hours as needed by nebulization for wheezing or shortness of breath. 75 mL 2  . albuterol (VENTOLIN HFA) 108 (90 Base) MCG/ACT inhaler INHALE 2 PUFFS BY MOUTH EVERY 4 TO 6 HOURS AS NEEDED FOR COUGH OR WHEEZING 18 g 0  . divalproex (DEPAKOTE ER) 500 MG 24 hr tablet Take 1,000 mg by mouth 2 (two) times daily.    . mometasone (NASONEX) 50 MCG/ACT nasal spray Use one to two sprays in each nostril once daily 17 g 5  . zonisamide (ZONEGRAN) 100 MG capsule Take 100 mg by  mouth 2 (two) times daily.     . benzonatate (TESSALON) 200 MG capsule Take 1 capsule (200 mg total) 3 (three) times daily as needed by mouth for cough. (Patient not taking: Reported on 01/24/2017) 30 capsule 0  . butalbital-acetaminophen-caffeine (FIORICET, ESGIC) 50-325-40 MG tablet Take 1 tablet by mouth every 4 (four) hours as needed for headache. (Patient not taking: Reported on 03/05/2017) 40 tablet 0  . meclizine (ANTIVERT) 25 MG tablet TAKE 1 TABLET BY MOUTH THREE TIMES DAILY AS NEEDED FOR DIZZINESS (Patient not taking: Reported on 03/05/2017) 20 tablet 0   Current Facility-Administered Medications on File Prior to Visit  Medication Dose Route Frequency Provider Last Rate Last Dose  . desmopressin (DDAVP) injection 34.04 mcg  0.3  mcg/kg (Order-Specific) Intravenous Once Fontaine, Belinda Block, MD       ROS as in subjective   Objective: BP 132/76   Pulse 99   Wt 191 lb 9.6 oz (86.9 kg)   LMP 09/16/2012   SpO2 97%   BMI 32.89 kg/m   Wt Readings from Last 3 Encounters:  03/05/17 191 lb 9.6 oz (86.9 kg)  01/10/17 193 lb 9.6 oz (87.8 kg)  01/04/17 194 lb (88 kg)   General appearance: Alert, WD/WN, no distress                             Skin: warm, no rash                           Head: + frontal sinus tenderness,                            Eyes: conjunctiva normal, corneas clear, PERRLA                            Ears: pearly TMs, external ear canals normal                          Nose: septum midline, turbinates swollen, with erythema and clear discharge             Mouth/throat: MMM, tongue normal, mild pharyngeal erythema                           Neck: supple, no adenopathy, no thyromegaly, non tender                          Heart: RRR, normal S1, S2, no murmurs                         Lungs: faint wheezes, but no dullness, no rales, or rhonchi    psych: seems annoyed to be here, checking time on watch every minute it seemed       Assessment: Encounter Diagnoses  Name Primary?  . Severe persistent asthma, unspecified whether complicated Yes  . Acute non-recurrent frontal sinusitis   . Seizure disorder ([place])   . Allergic rhinitis, unspecified seasonality, unspecified trigger   . Tobacco use disorder   . Chiari I malformation ([place])   . Von Willebrand disease ([place])   . Hyperlipidemia, unspecified hyperlipidemia type   . Vaccine refused by patient      Plan: Severe asthma-  continue Advair, however I gave her a short-term trial of a different preventative inhaler to see if it works better than what she is using, Breo.   Discussed proper use of medication.  She prefers pro-air for rescue inhaler which was sent for refill today.  She does have a home nebulizer machine and albuterol liquid for this.   I advise she follow up with her asthma specialist  Tobacco use-strongly encouraged her to consider the benefits of not smoking  Allergic rhinitis-encouraged better compliance with her medications, Nasonex, antihistamine  Acute sinusitis - begin round of Doxycycline, rest, hydrate well, call if not resolving within a week  Seizure disorder- followed by neurology at [facility]  Hyperlipidemia-discussed the recent labs done through gynecology.  Counseled on healthy diet and exercise  Von Willebrand's disease, Chiari malformation- I reviewed the hospital records from November 24, 2016 where she went to the hospital for severe headache.  She had a developing Chiari malformation had suboccipital decompression surgery, then developed a subdural hygroma.  She also had a pseudomeningocele.  She had a shunt placed subsequently with that hospitalization.  I reviewed consult notes from Dr. Beryle Beams from November 01, 2016 regarding type I von Willebrand's disorder which was thought to be either congenital versus acquired related to anti seizure medication.  The consult was mainly to advise on coverage with humate-P that contains factor VIII and von Willebrand factor for her intracranial surgery.   I encouraged her to consider a physical visit in a few months when she has time and does not seem rushed as she was today  Of note she declines vaccines in general  Cassidy Tran was seen today for asthma.  Diagnoses and all orders for this visit:  Severe persistent asthma, unspecified whether complicated  Acute non-recurrent frontal sinusitis  Seizure disorder ([place])  Allergic rhinitis, unspecified seasonality, unspecified trigger  Tobacco use disorder  Chiari I malformation ([place])  Von Willebrand disease ([place])  Hyperlipidemia, unspecified hyperlipidemia type  Vaccine refused by patient  Other orders -     PROAIR HFA 108 (90 Base) MCG/ACT inhaler; Inhale 2 puffs into the lungs every 6 (six)  hours as needed for wheezing or shortness of breath. -     doxycycline (VIBRA-TABS) 100 MG tablet; Take 1 tablet (100 mg total) by mouth 2 (two) times daily. -     fluticasone-salmeterol (ADVAIR HFA) 115-21 MCG/ACT inhaler; INHALE 2 PUFFS BY MOUTH EVERY 12 HOURS TO PREVENT COUGH OR WHEEZING -     fluticasone furoate-vilanterol (BREO ELLIPTA) 200-25 MCG/INH AEPB; Inhale 1 puff into the lungs daily.

## 2017-03-19 ENCOUNTER — Ambulatory Visit: Payer: BLUE CROSS/BLUE SHIELD

## 2017-03-28 ENCOUNTER — Ambulatory Visit: Payer: BLUE CROSS/BLUE SHIELD | Admitting: Gynecology

## 2017-03-28 ENCOUNTER — Other Ambulatory Visit: Payer: BLUE CROSS/BLUE SHIELD

## 2017-04-04 ENCOUNTER — Ambulatory Visit (INDEPENDENT_AMBULATORY_CARE_PROVIDER_SITE_OTHER): Payer: BLUE CROSS/BLUE SHIELD

## 2017-04-04 ENCOUNTER — Encounter: Payer: Self-pay | Admitting: Gynecology

## 2017-04-04 ENCOUNTER — Ambulatory Visit (INDEPENDENT_AMBULATORY_CARE_PROVIDER_SITE_OTHER): Payer: BLUE CROSS/BLUE SHIELD | Admitting: Gynecology

## 2017-04-04 VITALS — BP 130/80

## 2017-04-04 DIAGNOSIS — R102 Pelvic and perineal pain: Secondary | ICD-10-CM

## 2017-04-04 DIAGNOSIS — N809 Endometriosis, unspecified: Secondary | ICD-10-CM | POA: Diagnosis not present

## 2017-04-04 NOTE — Progress Notes (Signed)
    Cassidy Tran 1975-03-11 [id number]        42 y.o.  G0P0 presents for follow-up ultrasound.  History of suprapubic pain sharp stabbing coming and going.  Seem to correlate when she had a Foley catheter removed after her last brain surgery.  She does have a history of TLH in the past for endometriosis which included the dome of the bladder.  She is not having associated symptoms such as nausea vomiting diarrhea constipation or urinary symptoms such as frequency dysuria urgency low back pain fever or chills.  Sometimes correlates with intercourse but not always.  Past medical history,surgical history, problem list, medications, allergies, family history and social history were all reviewed and documented in the EPIC chart.  Directed ROS with pertinent positives and negatives documented in the history of present illness/assessment and plan.  Exam: Vitals:   04/04/17 1603  BP: 130/80   General appearance:  Normal  Ultrasound transvaginal consistent with hysterectomy history shows vaginal cuff negative.  Left ovary normal.  Right ovary with C-shaped small cystic echo-free mass 16 x 11 x 12 mm.  Cul-de-sac with a 50 x 22 x 26 mm fluid with floating septums within.  Assessment/Plan:  42 y.o. G0P0 with pelvic pain over the last several months.  History of endometriosis status post TLH.  Ultrasound shows cul-de-sac fluid with septum consistent with peritoneal type inclusion cysts.  Remains unchanged from the last time we looked 12/2016.  Ovaries otherwise appear normal.  Differential reviewed to include possible endometriosis.  Options to include endometriosis suppressive therapy such as Depo-Lupron or [place].  Side effects menopausal symptoms and bone loss reviewed.  Surgeries to include BSO also reviewed although at her young age the risks of loss of estrogen also discussed.  Urologic referral possible cystoscopy to look for bladder endometriosis discussed.  Patient prefers urology referral now.   Will consider endometriosis suppressive therapy and follow-up.  We will go ahead and arrange for urology to see her and then go from there.    [name] MD, 4:17 PM 04/04/2017

## 2017-04-04 NOTE — Patient Instructions (Signed)
Office will call to arrange the urology appointment.  Call if you do not hear from them within the next 2 weeks.

## 2017-04-09 ENCOUNTER — Telehealth: Payer: Self-pay | Admitting: *Deleted

## 2017-04-09 NOTE — Telephone Encounter (Signed)
Referral notes faxed to [facility] they will call pt to schedule.

## 2017-04-09 NOTE — Telephone Encounter (Signed)
-----   Message from [name], MD sent at 04/04/2017  4:22 PM EST ----- Urology referral reference suprapubic sharp stabbing pain.  Status post hysterectomy in the past with endometriosis to include dome of bladder

## 2017-04-19 NOTE — Telephone Encounter (Signed)
Pt scheduled on 05/15/17 @ 3pm

## 2017-04-20 ENCOUNTER — Other Ambulatory Visit: Payer: Self-pay | Admitting: Medical

## 2017-04-23 ENCOUNTER — Other Ambulatory Visit: Payer: Self-pay | Admitting: Medical

## 2017-04-23 NOTE — Telephone Encounter (Signed)
Is this okay to refill? 

## 2017-04-23 NOTE — Telephone Encounter (Signed)
Is this okay to refill or does this need get from [facility]

## 2017-04-26 ENCOUNTER — Other Ambulatory Visit: Payer: Self-pay | Admitting: Medical

## 2017-05-27 ENCOUNTER — Other Ambulatory Visit: Payer: Self-pay | Admitting: Medical

## 2017-06-20 ENCOUNTER — Other Ambulatory Visit: Payer: Self-pay | Admitting: Medical

## 2017-06-20 NOTE — Telephone Encounter (Signed)
[facility] is requesting to fill pt albuterol neb solution. Please advise [facility]

## 2017-06-20 NOTE — Telephone Encounter (Signed)
Please advise if albuterol can be fill for pt at [facility] on [address]. Thanks [employer]

## 2017-07-11 ENCOUNTER — Other Ambulatory Visit: Payer: Self-pay | Admitting: Family Medicine

## 2017-07-12 NOTE — Telephone Encounter (Signed)
Pleased advise If butalbital-acetaminophen-caffeine can be filled for pt . [place]

## 2017-07-20 ENCOUNTER — Telehealth: Payer: Self-pay

## 2017-07-20 NOTE — Telephone Encounter (Signed)
Left message on voicemail for patient to call back to schedule CPE per [facility] and she has form in brown folder.

## 2017-07-24 ENCOUNTER — Ambulatory Visit: Payer: BLUE CROSS/BLUE SHIELD | Admitting: Allergy and Immunology

## 2017-07-24 ENCOUNTER — Telehealth: Payer: Self-pay | Admitting: Allergy and Immunology

## 2017-07-24 MED ORDER — PROAIR HFA 108 (90 BASE) MCG/ACT IN AERS: INHALATION_SPRAY | RESPIRATORY_TRACT | 0 refills | Status: DC

## 2017-07-24 NOTE — Telephone Encounter (Signed)
Patient's prescription has been sent in as requested.

## 2017-07-24 NOTE — Telephone Encounter (Signed)
Pt called and made appointment for 08/07/2017 but needs proair called into pharmacy [phone].

## 2017-08-07 ENCOUNTER — Ambulatory Visit: Payer: BLUE CROSS/BLUE SHIELD | Admitting: Allergy and Immunology

## 2017-08-07 ENCOUNTER — Encounter: Payer: Self-pay | Admitting: Allergy and Immunology

## 2017-08-07 VITALS — BP 158/90 | HR 68 | Resp 20

## 2017-08-07 DIAGNOSIS — J3089 Other allergic rhinitis: Secondary | ICD-10-CM

## 2017-08-07 DIAGNOSIS — J4551 Severe persistent asthma with (acute) exacerbation: Secondary | ICD-10-CM

## 2017-08-07 DIAGNOSIS — F17210 Nicotine dependence, cigarettes, uncomplicated: Secondary | ICD-10-CM | POA: Diagnosis not present

## 2017-08-07 MED ORDER — TIOTROPIUM BROMIDE MONOHYDRATE 1.25 MCG/ACT IN AERS: 2.0000 | INHALATION_SPRAY | Freq: Every day | RESPIRATORY_TRACT | 5 refills | Status: DC

## 2017-08-07 MED ORDER — ALBUTEROL SULFATE (2.5 MG/3ML) 0.083% IN NEBU: 2.5000 mg | INHALATION_SOLUTION | RESPIRATORY_TRACT | 1 refills | Status: DC | PRN

## 2017-08-07 MED ORDER — ALBUTEROL SULFATE HFA 108 (90 BASE) MCG/ACT IN AERS: 2.0000 | INHALATION_SPRAY | RESPIRATORY_TRACT | 3 refills | Status: DC | PRN

## 2017-08-07 MED ORDER — FLUTICASONE-SALMETEROL 230-21 MCG/ACT IN AERO: 2.0000 | INHALATION_SPRAY | Freq: Two times a day (BID) | RESPIRATORY_TRACT | 5 refills | Status: DC

## 2017-08-07 MED ORDER — MONTELUKAST SODIUM 10 MG PO TABS: 10.0000 mg | ORAL_TABLET | Freq: Every day | ORAL | 5 refills | Status: DC

## 2017-08-07 MED ORDER — METHYLPREDNISOLONE ACETATE 80 MG/ML IJ SUSP
80.0000 mg | Freq: Once | INTRAMUSCULAR | Status: AC
  Administered 2017-08-07: 80 mg via INTRAMUSCULAR

## 2017-08-07 MED ORDER — MOMETASONE FUROATE 50 MCG/ACT NA SUSP: 2.0000 | Freq: Every day | NASAL | 5 refills | Status: DC

## 2017-08-07 NOTE — Progress Notes (Signed)
Follow-up Note  Referring Provider: Carlena Hurl, PA-C Primary Provider: Caryl Ada Date of Office Visit: 08/07/2017  Subjective:   Cassidy Tran (DOB: February 28, 1975) is a 42 y.o. female who returns to the [facility] [place] on 08/07/2017 in re-evaluation of the following:  HPI: Sheily returns to this clinic in reevaluation of her asthma and allergic rhinoconjunctivitis.  Her last visit to this clinic was 28 September 2015.  She has had a terrible spring.  She has wheezing and coughing and nasal congestion and sneezing and has been using her bronchodilator multiple times a day even though she continues to use a nasal steroid and a combination inhaler.  She has chronic anosmia but has not had any ugly nasal discharge or recurrent fevers.    She still continues to smoke about a half a pack of cigarettes per day.  Allergies as of 08/07/2017      Reactions   Aspirin Other (See Comments)   Childhood reaction   Codeine Nausea And Vomiting   Tolerates Hydrocodone   Erythromycin Other (See Comments)   Childhood reaction, tolerate Zpak   Lactose Intolerance (gi) Diarrhea   Nsaids Nausea And Vomiting   Penicillins Other (See Comments)   Has patient had a PCN reaction causing immediate rash, facial/tongue/throat swelling, SOB or lightheadedness with hypotension: doesn't remember childhood Reaction  Has patient had a PCN reaction causing severe rash involving mucus membranes or skin necrosis: NO Has patient had a PCN reaction that required hospitalization NO Has patient had a PCN reaction occurring within the last 10 years: NO If all of the above answers are "NO", then may proceed with Cephalosporin use.      Medication List             acetaminophen 500 MG tablet Commonly known as:  TYLENOL Take 1,000 mg by mouth every 6 (six) hours as needed for mild pain or moderate pain. pain   albuterol (2.5 MG/3ML) 0.083% nebulizer solution Commonly known as:   PROVENTIL USE 1 VIAL VIA NEBULIZER EVERY 6 HOURS AS NEEDED FOR WHEEZING OR SHORTNESS OF BREATH   PROAIR HFA 108 (90 Base) MCG/ACT inhaler Generic drug:  albuterol INHALE 2 PUFFS INTO THE LUNGS EVERY 6 HOURS AS NEEDED FOR WHEEZING OR SHORTNESS OF BREATH   albuterol 108 (90 Base) MCG/ACT inhaler Commonly known as:  PROAIR HFA Inhale 2 puffs into the lungs every 4 (four) hours as needed for wheezing or shortness of breath.   albuterol (2.5 MG/3ML) 0.083% nebulizer solution Commonly known as:  PROVENTIL Take 3 mLs (2.5 mg total) by nebulization every 4 (four) hours as needed for wheezing or shortness of breath.   butalbital-acetaminophen-caffeine 50-325-40 MG tablet Commonly known as:  FIORICET, ESGIC TAKE 1 TO 2 TABLETS BY MOUTH EVERY 6 HOURS AS NEEDED FOR HEADACHE   divalproex 500 MG 24 hr tablet Commonly known as:  DEPAKOTE ER Take 1,000 mg by mouth 2 (two) times daily.   fluticasone-salmeterol 115-21 MCG/ACT inhaler Commonly known as:  ADVAIR HFA INHALE 2 PUFFS BY MOUTH EVERY 12 HOURS TO PREVENT COUGH OR WHEEZING   fluticasone-salmeterol 230-21 MCG/ACT inhaler Commonly known as:  ADVAIR HFA Inhale 2 puffs into the lungs 2 (two) times daily.   meclizine 25 MG tablet Commonly known as:  ANTIVERT TAKE 1 TABLET BY MOUTH THREE TIMES DAILY AS NEEDED FOR DIZZINESS   mometasone 50 MCG/ACT nasal spray Commonly known as:  NASONEX Use one to two sprays in each nostril once daily  mometasone 50 MCG/ACT nasal spray Commonly known as:  NASONEX Place 2 sprays into the nose daily.   montelukast 10 MG tablet Commonly known as:  SINGULAIR Take 1 tablet (10 mg total) by mouth at bedtime.   Tiotropium Bromide Monohydrate 1.25 MCG/ACT Aers Commonly known as:  SPIRIVA RESPIMAT Inhale 2 puffs into the lungs daily.   zonisamide 100 MG capsule Commonly known as:  ZONEGRAN Take 100 mg by mouth 2 (two) times daily.       Past Medical History:  Diagnosis Date  . Allergy   . Anxiety    . Arthritis    lower back  . ASCUS (atypical squamous cells of undetermined significance) on Pap smear 03/2011   NEG HR HPV  . Asthma   . Cancer [facility] [place])    skin cancer- age 62ish  . Cervical dysplasia, mild 08/2010   LGSIL colposcopy biopsy showing koilocytotic atypia  . Clotting disorder ([place])   . Depression   . Endometriosis   . Headache(784.0)   . High risk HPV infection 12/2011   Pap normal  . Pneumonia    2016ish  . Seizures ([place])    seizure disorder  . Smoker   . Von Willebrand disease ([place])     Past Surgical History:  Procedure Laterality Date  . APPLICATION OF CRANIAL NAVIGATION N/A 11/24/2016   Procedure: APPLICATION OF CRANIAL NAVIGATION;  Surgeon: Kristeen Miss, MD;  Location: [place];  Service: Neurosurgery;  Laterality: N/A;  . BREAST BIOPSY Left   . COLPOSCOPY    . CYSTOSCOPY N/A 10/14/2012   Procedure: CYSTOSCOPY;  Surgeon: [name], MD;  Location: [facility];  Service: Gynecology;  Laterality: N/A;  . DILATION AND CURETTAGE OF UTERUS    . HYSTEROSCOPY W/D&C  10/14/2010   Procedure: DILATATION AND CURETTAGE (D&C) /HYSTEROSCOPY;  Surgeon: [name], MD;  Location: [facility];  Service: Gynecology;  Laterality: N/A;  . LAPAROSCOPIC HYSTERECTOMY N/A 10/14/2012   Procedure: HYSTERECTOMY TOTAL LAPAROSCOPIC;  Surgeon: [name], MD;  Location: [facility];  Service: Gynecology;  Laterality: N/A;  CPT [id number]  2 1/2 hours  Dr. Uvaldo Rising to assist.  . LAPAROSCOPY  04/27/2011   Procedure: LAPAROSCOPY OPERATIVE;  Surgeon: [name], MD;  Location: [facility];  Service: Gynecology;  Laterality: N/A;  removal right cyst , lysis of adhesions, biopsy of peritoneum  . [place] CRANIECTOMY CERVICAL LAMINECTOMY N/A 11/06/2016   Procedure: Suboccipital decompression for chiari malformation;  Surgeon: Kristeen Miss, MD;  Location: [place];  Service: Neurosurgery;  Laterality: N/A;  . TONSILLECTOMY    .  VENTRICULOPERITONEAL SHUNT N/A 11/24/2016   Procedure: SHUNT INSERTION VENTRICULAR-PERITONEAL with Brainlab;  Surgeon: Kristeen Miss, MD;  Location: [place];  Service: Neurosurgery;  Laterality: N/A;  right side approach  . [place] EXTRACTION  1993    Review of systems negative except as noted in HPI / PMHx or noted below:  Review of Systems  Constitutional: Negative.   HENT: Negative.   Eyes: Negative.   Respiratory: Negative.   Cardiovascular: Negative.   Gastrointestinal: Negative.   Genitourinary: Negative.   Musculoskeletal: Negative.   Skin: Negative.   Neurological: Negative.   Endo/Heme/Allergies: Negative.   Psychiatric/Behavioral: Negative.      Objective:   Vitals:   08/07/17 1754  BP: (!) 158/90  Pulse: 68  Resp: 20  SpO2: 94%          Physical Exam  HENT:  Head: Normocephalic.  Right Ear: Tympanic membrane, external ear and ear canal normal.  Left Ear: Tympanic membrane, external ear and ear canal normal.  Nose: Mucosal edema present. No rhinorrhea.  Mouth/Throat: Uvula is midline, oropharynx is clear and moist and mucous membranes are normal. No oropharyngeal exudate.  Eyes: Conjunctivae are normal.  Neck: Trachea normal. No tracheal tenderness present. No tracheal deviation present. No thyromegaly present.  Cardiovascular: Normal rate, regular rhythm, S1 normal, S2 normal and normal heart sounds.  No murmur heard. Pulmonary/Chest: No stridor. No respiratory distress. She has wheezes (Inspiratory and expiratory wheezing throughout all lung fields). She has no rales.  Musculoskeletal: She exhibits no edema.  Lymphadenopathy:       Head (right side): No tonsillar adenopathy present.       Head (left side): No tonsillar adenopathy present.    She has no cervical adenopathy.  Neurological: She is alert.  Skin: No rash noted. She is not diaphoretic. No erythema. Nails show no clubbing.    Diagnostics:    Spirometry was performed and demonstrated an  FEV1 of 1.16 at 41 % of predicted.  The patient had an Asthma Control Test with the following results: ACT Total Score: 18.    Assessment and Plan:   1. Asthma, not well controlled, severe persistent, with acute exacerbation   2. Other allergic rhinitis   3. Light tobacco smoker <10 cigarettes per day     1. INCREASE Advair 230 HFA 2 inhalations twice a day  2. Start Spiriva 1.25 respimat - 2 inhalations one time per day  3. Start montelukast 10mg  - 1 tablet one time per day  4. Depomedrol 80 mg IM delivered in clinic today  5. Nebulized albuterol and ipratropium delivered in clinic today  6. Continue Nasonex 1-2 sprays each nostril 3-7 times per week  7. Continue ProAir HFA 2 puffs or albuterol nebulizer every 4-6 hours if needed  8. Continue to work towards consolidating smoke exposure and using nicotine substitutes  9. Blood - CBC w/diff, Area 2 aeroallergen profile, alpha-1 antitrypsin level and phenotype  10. Return to clinic in 2 weeks or earlier if problem  Mackie has very significant inflammation of her airway and I have treated her with a combination of anti-inflammatory agents as noted above.  I will check her blood to see if she has significant eosinophilia and to define her aero allergen profile to see if she is a candidate for a biological agent and/or immunotherapy and to be complete we will check an alpha-1 antitrypsin level and phenotype.  I did have a talk with her today about her smoking once again.  I will see her back in this clinic in 2 weeks or earlier if there is a problem.  Allena Katz, MD Allergy / Immunology [place]

## 2017-08-07 NOTE — Patient Instructions (Addendum)
  1. INCREASE Advair 230 HFA 2 inhalations twice a day  2. Start Spiriva 1.25 respimat - 2 inhalations one time per day  3. Start montelukast 10mg  - 1 tablet one time per day  4. Depomedrol 80 mg IM delivered in clinic today  5. Nebulized albuterol and ipratropium delivered in clinic today  6. Continue Nasonex 1-2 sprays each nostril 3-7 times per week  7. Continue ProAir HFA 2 puffs or albuterol nebulizer every 4-6 hours if needed  8. Continue to work towards consolidating smoke exposure and using nicotine substitutes  9. Blood - CBC w/diff, Area 2 aeroallergen profile, alpha-1 antitrypsin level and phenotype  10. Return to clinic in 2 weeks or earlier if problem

## 2017-08-08 ENCOUNTER — Encounter: Payer: Self-pay | Admitting: Allergy and Immunology

## 2017-08-13 LAB — ALLERGENS W/TOTAL IGE AREA 2
Cat Dander IgE: 0.89 kU/L — AB
Cedar, Mountain IgE: <0.1 kU/L
Cladosporium Herbarum IgE: <0.1 kU/L
Common Silver Birch IgE: <0.1 kU/L
D Farinae IgE: 0.29 kU/L — AB
D001-IGE D PTERONYSSINUS: 0.32 kU/L — AB
Dog Dander IgE: 0.31 kU/L — AB
Elm, American IgE: <0.1 kU/L
IgE (Immunoglobulin E), Serum: 24 IU/mL (ref 6–495)
M003-IGE ASPERGILLUS FUMIGATUS: 0.73 kU/L — AB
M006-IGE ALTERNARIA ALTERNATA: 0.19 kU/L — AB
Maple/Box Elder IgE: <0.1 kU/L
Mouse Urine IgE: <0.1 kU/L
Oak, White IgE: <0.1 kU/L
Pecan, Hickory IgE: <0.1 kU/L
Penicillium Chrysogen IgE: 0.17 kU/L — AB
Sheep Sorrel IgE Qn: <0.1 kU/L

## 2017-08-13 LAB — CBC WITH DIFFERENTIAL/PLATELET
BASOS: 0 %
Basophils Absolute: 0 10*3/uL (ref 0.0–0.2)
EOS (ABSOLUTE): 0.1 10*3/uL (ref 0.0–0.4)
Eos: 1 %
Hematocrit: 38.2 % (ref 34.0–46.6)
Hemoglobin: 12.5 g/dL (ref 11.1–15.9)
IMMATURE GRANULOCYTES: 0 %
Immature Grans (Abs): 0 10*3/uL (ref 0.0–0.1)
Lymphocytes Absolute: 3.7 10*3/uL — ABNORMAL HIGH (ref 0.7–3.1)
Lymphs: 32 %
MCH: 28.4 pg (ref 26.6–33.0)
MCHC: 32.7 g/dL (ref 31.5–35.7)
MCV: 87 fL (ref 79–97)
MONOS ABS: 0.6 10*3/uL (ref 0.1–0.9)
Monocytes: 5 %
NEUTROS PCT: 62 %
Neutrophils Absolute: 7.3 10*3/uL — ABNORMAL HIGH (ref 1.4–7.0)
PLATELETS: 405 10*3/uL (ref 150–450)
RBC: 4.4 x10E6/uL (ref 3.77–5.28)
RDW: 14.8 % (ref 12.3–15.4)
WBC: 11.7 10*3/uL — ABNORMAL HIGH (ref 3.4–10.8)

## 2017-08-13 LAB — ALPHA-1 ANTITRYPSIN PHENOTYPE: A1 ANTITRYPSIN: 176 mg/dL (ref 90–200)

## 2017-08-21 ENCOUNTER — Other Ambulatory Visit: Payer: Self-pay | Admitting: Medical

## 2017-08-21 NOTE — Telephone Encounter (Signed)
Is this ok to refill?  

## 2017-08-21 NOTE — Telephone Encounter (Signed)
Make sure she is getting this filled by her asthma specialist as she is seeing asthma specialist.

## 2017-08-21 NOTE — Telephone Encounter (Signed)
Per patient she does get this refilled by her Asthma Dr and she states that she did not need any now.

## 2017-08-28 ENCOUNTER — Ambulatory Visit: Payer: BLUE CROSS/BLUE SHIELD | Admitting: Allergy and Immunology

## 2017-09-03 ENCOUNTER — Other Ambulatory Visit (HOSPITAL_COMMUNITY): Payer: Self-pay | Admitting: Neurological Surgery

## 2017-09-03 ENCOUNTER — Ambulatory Visit (HOSPITAL_COMMUNITY)
Admission: RE | Admit: 2017-09-03 | Discharge: 2017-09-03 | Disposition: A | Discharge status: Home / Self Care | Payer: BLUE CROSS/BLUE SHIELD | Source: Ambulatory Visit | Attending: Neurological Surgery | Admitting: Neurological Surgery

## 2017-09-03 DIAGNOSIS — Z9889 Other specified postprocedural states: Secondary | ICD-10-CM | POA: Insufficient documentation

## 2017-09-03 DIAGNOSIS — Z982 Presence of cerebrospinal fluid drainage device: Secondary | ICD-10-CM | POA: Insufficient documentation

## 2017-09-03 DIAGNOSIS — R51 Headache: Principal | ICD-10-CM

## 2017-09-03 DIAGNOSIS — R519 Headache, unspecified: Secondary | ICD-10-CM

## 2017-11-15 ENCOUNTER — Other Ambulatory Visit: Payer: Self-pay | Admitting: Allergy and Immunology

## 2017-12-06 ENCOUNTER — Other Ambulatory Visit: Payer: Self-pay | Admitting: Medical

## 2017-12-06 ENCOUNTER — Telehealth: Payer: Self-pay | Admitting: Medical

## 2017-12-06 NOTE — Telephone Encounter (Signed)
pls schedule a physical

## 2017-12-06 NOTE — Telephone Encounter (Signed)
Is this ok to refill?  

## 2017-12-07 NOTE — Telephone Encounter (Signed)
Pt is coming in November the 15th

## 2017-12-14 ENCOUNTER — Encounter: Payer: BLUE CROSS/BLUE SHIELD | Admitting: Medical

## 2017-12-18 ENCOUNTER — Other Ambulatory Visit: Payer: Self-pay | Admitting: Allergy and Immunology

## 2018-01-08 ENCOUNTER — Encounter: Payer: Self-pay | Admitting: Gynecology

## 2018-01-08 ENCOUNTER — Ambulatory Visit (INDEPENDENT_AMBULATORY_CARE_PROVIDER_SITE_OTHER): Payer: BLUE CROSS/BLUE SHIELD | Admitting: Gynecology

## 2018-01-08 VITALS — BP 124/82 | Ht 63.0 in | Wt 210.0 lb

## 2018-01-08 DIAGNOSIS — Z01419 Encounter for gynecological examination (general) (routine) without abnormal findings: Secondary | ICD-10-CM | POA: Diagnosis not present

## 2018-01-08 DIAGNOSIS — R102 Pelvic and perineal pain: Secondary | ICD-10-CM | POA: Diagnosis not present

## 2018-01-08 NOTE — Progress Notes (Signed)
    Shakiyla Kook January 13, 1976 [id number]        42 y.o.  G0P0 for annual gynecologic exam.  Continues with pelvic pain on and off.  Was to see urology but had to miss the appointment.  Past medical history,surgical history, problem list, medications, allergies, family history and social history were all reviewed and documented as reviewed in the EPIC chart.  ROS:  Performed with pertinent positives and negatives included in the history, assessment and plan.   Additional significant findings : None   Exam: [name] assistant Vitals:   01/08/18 1450  BP: 124/82  Weight: 210 lb (95.3 kg)  Height: 5\' 3"  (1.6 m)   Body mass index is 37.2 kg/m.  General appearance:  Normal affect, orientation and appearance. Skin: Grossly normal HEENT: Without gross lesions.  No cervical or supraclavicular adenopathy. Thyroid normal.  Lungs:  Clear without wheezing, rales or rhonchi Cardiac: RR, without RMG Abdominal:  Soft, nontender, without masses, guarding, rebound, organomegaly or hernia Breasts:  Examined lying and sitting without masses, retractions, discharge or axillary adenopathy. Pelvic:  Ext, BUS, Vagina: Normal  Adnexa: Without masses or tenderness    Anus and perineum: Normal   Rectovaginal: Normal sphincter tone without palpated masses or tenderness.    Assessment/Plan:  42 y.o. G0P0 female for annual gynecologic exam status post TLH for endometriosis.   1. Pelvic pain.  Continues on and off.  Not daily but frequent.  Had endometriosis involving the dome of the bladder at her hysterectomy.  I recommended she follow-up with urology first to clear the bladder noting her pain seemed to be getting better until she had removal of a Foley catheter after brain surgery which seem to exacerbate all of her pain.  Ultrasound this past year showed a small cystic echo-free C-shaped area on the right 16 x 11 x 12 but otherwise was unremarkable and no evidence to suggest endometriomas.  Recommend  patient follow-up with urology now.  We discussed possible suppressive therapies such as Depo-Lupron to see if it does not improve her pain up to and including laparoscopic BSO.  Patient will follow-up after urology evaluation. 2. Mammography 2018.  Recommended patient schedule mammogram now and she agrees to do so.  Breast exam normal today. 3. Pap smear 12/2016.  No Pap smear done today.  History of LGSIL 2012.  ASCUS with positive high risk HPV 2013.  Normal Pap smears since with a negative intervening HPV. 4. Health maintenance.  Patient has appointment with her primary physician coming up and she will follow-up with them in reference to routine blood work.  Follow-up in 1 year, sooner as needed.   [name] MD, 3:11 PM 01/08/2018

## 2018-01-08 NOTE — Patient Instructions (Signed)
Schedule your mammogram  Follow-up with urology in reference to pelvic pain

## 2018-01-11 ENCOUNTER — Encounter: Payer: BLUE CROSS/BLUE SHIELD | Admitting: Medical

## 2018-01-11 ENCOUNTER — Telehealth: Payer: Self-pay

## 2018-01-11 NOTE — Telephone Encounter (Signed)

## 2018-01-11 NOTE — Telephone Encounter (Signed)
E, send no show letter

## 2018-01-18 ENCOUNTER — Other Ambulatory Visit: Payer: Self-pay | Admitting: Allergy and Immunology

## 2018-01-25 ENCOUNTER — Other Ambulatory Visit: Payer: Self-pay | Admitting: Allergy and Immunology

## 2018-01-28 ENCOUNTER — Other Ambulatory Visit: Payer: Self-pay

## 2018-01-28 MED ORDER — FLUTICASONE-SALMETEROL 230-21 MCG/ACT IN AERO: 2.0000 | INHALATION_SPRAY | Freq: Two times a day (BID) | RESPIRATORY_TRACT | 0 refills | Status: DC

## 2018-01-28 MED ORDER — MONTELUKAST SODIUM 10 MG PO TABS: ORAL_TABLET | ORAL | 0 refills | Status: DC

## 2018-02-01 ENCOUNTER — Encounter: Payer: Self-pay | Admitting: Medical

## 2018-02-01 NOTE — Telephone Encounter (Signed)
No show letter with fee sent. Forwarding to [place] to access fee.

## 2018-02-25 ENCOUNTER — Other Ambulatory Visit: Payer: Self-pay | Admitting: Allergy and Immunology

## 2018-03-22 ENCOUNTER — Other Ambulatory Visit: Payer: Self-pay | Admitting: Allergy and Immunology

## 2018-03-22 NOTE — Telephone Encounter (Signed)
Courtesy refill  

## 2018-03-26 ENCOUNTER — Encounter: Payer: Self-pay | Admitting: Medical

## 2018-03-26 ENCOUNTER — Ambulatory Visit: Payer: BLUE CROSS/BLUE SHIELD | Admitting: Medical

## 2018-03-26 VITALS — BP 158/80 | HR 100 | Temp 98.2°F | Ht 63.0 in | Wt 214.8 lb

## 2018-03-26 DIAGNOSIS — J4550 Severe persistent asthma, uncomplicated: Secondary | ICD-10-CM | POA: Diagnosis not present

## 2018-03-26 DIAGNOSIS — F410 Panic disorder [episodic paroxysmal anxiety] without agoraphobia: Secondary | ICD-10-CM

## 2018-03-26 DIAGNOSIS — F17200 Nicotine dependence, unspecified, uncomplicated: Secondary | ICD-10-CM

## 2018-03-26 DIAGNOSIS — F430 Acute stress reaction: Secondary | ICD-10-CM | POA: Diagnosis not present

## 2018-03-26 DIAGNOSIS — J988 Other specified respiratory disorders: Secondary | ICD-10-CM

## 2018-03-26 MED ORDER — PREDNISONE 10 MG PO TABS: ORAL_TABLET | ORAL | 0 refills | Status: DC

## 2018-03-26 MED ORDER — ALBUTEROL SULFATE (2.5 MG/3ML) 0.083% IN NEBU: 2.5000 mg | INHALATION_SOLUTION | Freq: Four times a day (QID) | RESPIRATORY_TRACT | 1 refills | Status: DC | PRN

## 2018-03-26 MED ORDER — DOXYCYCLINE HYCLATE 100 MG PO TABS: 100.0000 mg | ORAL_TABLET | Freq: Two times a day (BID) | ORAL | 0 refills | Status: DC

## 2018-03-26 MED ORDER — ALPRAZOLAM 0.5 MG PO TABS: 0.5000 mg | ORAL_TABLET | Freq: Every evening | ORAL | 0 refills | Status: DC | PRN

## 2018-03-26 NOTE — Progress Notes (Signed)
Subjective:  Cassidy Tran is a 43 y.o. female who presents for cough.  Few weeks ago had illness, went to urgent care, was given zpak for cough and congestion.   Got some better.  But now sick again. Been worse the past week+ with ongoing cough, finished zpak a week ago.   She notes head and chest congestion, hoarse voice.  No fever, no sore throat, no vomiting.   Has some nausea related to anxiety with work.   Has hx/o asthma, using inhaler about daily this past week or 2.  Some productive cough.  Been a smoker for at least 20 years, <1 ppd.   Has some sinus pressure.  Has had to use prednisone several times in the past, particular in childhood.  Sees asthma and allergy doctor.  Can't sleep well now, having some anxiety issues with work stress.  Been having panic attacks, having to run out side at work and cry.   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   No other aggravating or relieving factors.  No other c/o.  The following portions of the patient's history were reviewed and updated as appropriate: allergies, current medications, past family history, past medical history, past social history, past surgical history and problem list.  ROS as in subjective  Past Medical History:  Diagnosis Date  . Allergy   . Anxiety   . Arthritis    lower back  . ASCUS (atypical squamous cells of undetermined significance) on Pap smear 03/2011   NEG HR HPV  . Asthma   . Cancer [facility])    skin cancer- age 28ish  . Cervical dysplasia, mild 08/2010   LGSIL colposcopy biopsy showing koilocytotic atypia  . Clotting disorder ([place])   . Depression   . Endometriosis   . Headache(784.0)   . High risk HPV infection 12/2011   Pap normal  . Pneumonia    2016ish  . Seizures ([place])    seizure disorder  . Smoker   . Von Willebrand disease ([place])     Current Outpatient Medications on File Prior to Visit  Medication Sig Dispense Refill  . gabapentin (NEURONTIN) 300 MG capsule TK 2 CS PO HS FOR POSTERIOR HEAD PAINS    . acetaminophen (TYLENOL) 500 MG tablet Take 1,000 mg by mouth every 6 (six) hours as needed for mild pain or moderate pain. pain     . ADVAIR HFA 230-21 MCG/ACT inhaler INHALE 2 PUFFS BY MOUTH INTO THE LUNGS TWICE DAILY 12 g 0  . butalbital-acetaminophen-caffeine (FIORICET, ESGIC) 50-325-40 MG tablet TAKE 1 TO 2 TABLETS BY MOUTH EVERY 6 HOURS AS NEEDED FOR HEADACHE (Patient not taking: Reported on 01/08/2018) 30 tablet 0  . divalproex (DEPAKOTE ER) 500 MG 24 hr tablet Take 1,000 mg by mouth 2 (two) times daily.    . meclizine (ANTIVERT) 25 MG tablet TAKE 1 TABLET BY MOUTH THREE TIMES DAILY AS NEEDED FOR DIZZINESS 20 tablet 0  . mometasone (NASONEX) 50 MCG/ACT nasal spray Use one to two sprays in each nostril once daily 17 g 5  . mometasone (NASONEX) 50 MCG/ACT nasal spray Place 2 sprays into the nose daily. 17 g 5  . montelukast (SINGULAIR) 10 MG tablet TAKE 1 TABLET BY MOUTH AT BEDTIME 30 tablet 0  . PROAIR HFA 108 (90 Base) MCG/ACT inhaler INHALE 2 PUFFS INTO THE LUNGS EVERY 4 HOURS  AS NEEDED FOR WHEEZING OR SHORTNESS OF BREATH 8.5 g 0  . zonisamide (ZONEGRAN) 100 MG capsule Take 100 mg by mouth 2 (two) times daily.      Current Facility-Administered Medications on File Prior to Visit  Medication Dose Route Frequency Provider Last Rate Last Dose  . desmopressin (DDAVP) injection 34.04 mcg  0.3 mcg/kg (Order-Specific) Intravenous Once Fontaine, Timothy P, MD       ROS as in subjective    Objective: BP (!) 158/80   Pulse 100   Temp 98.2 F (36.8 C) (Oral)   Ht 5\' 3"  (1.6 m)   Wt 214 lb 12.8 oz (97.4 kg)   LMP 09/16/2012   SpO2 97%   BMI 38.05 kg/m    Wt Readings from Last 3 Encounters:  03/26/18 214 lb 12.8 oz (97.4 kg)  01/08/18 210 lb (95.3 kg)  03/05/17 191 lb 9.6 oz (86.9 kg)   BP Readings from Last 3  Encounters:  03/26/18 (!) 158/80  01/08/18 124/82  08/07/17 (!) 158/90   General appearance: Alert, WD/WN, no distress,somewhat ill appearing                             Skin: warm, no rash, no diaphoresis                           Head: no sinus tenderness                            Eyes: conjunctiva normal, corneas clear, PERRLA                            Ears: pearly TMs, external ear canals normal                          Nose: septum midline, turbinates swollen, with erythema and mucoid discharge             Mouth/throat: MMM, tongue normal, mild pharyngeal erythema                           Neck: supple, no adenopathy, no thyromegaly, nontender                          Heart: RRR, normal S1, S2, no murmurs                         Lungs: +bronchial breath sounds, +scattered rhonchi,+ wheezes, no rales                Extremities: no edema, nontender Psych: pleasant, but seems anxious when talking about work        Assessment: Encounter Diagnoses  Name Primary?  . Severe persistent asthma, unspecified whether complicated Yes  . Respiratory tract infection   . Acute stress reaction   . Panic attack   . Smoking      Plan:  Discussed her asthma and illness symptoms,  Discussed treatment recommendations below, f/u if not improving this week  Patient Instructions  Recommendations:  Begin doxycycline 1 tablet twice daily antibiotic for 1 week  Begin prednisone steroid taper as directed on the label x1 week  Drink plenty of water throughout the day  You can use Mucinex DM for cough and congestion  You can use salt water gargles and warm fluids to clear mucus out of the back of the throat  You could also do nasal saline to flush out the nasal passages since that is draining down causing some hoarseness in the voice  Continue your Advair as usual  Use your albuterol rescue inhaler or albuterol nebulizer every 4-6 hours  If not much improved by the end of the week then  call back   Tobacco use - advised she work to quit  Panic attack, anxiety - counseling on some coping skills, can use short term xanax she has used prior, discussed some strategies for dealing with the stressors.  F/u in a few weeks here with PCP   Cassidy Tran was seen today for cough and anxiety.  Diagnoses and all orders for this visit:  Severe persistent asthma, unspecified whether complicated  Respiratory tract infection  Acute stress reaction  Panic attack  Smoking  Other orders -     predniSONE (DELTASONE) 10 MG tablet; 6/5/4/3/2/1 -     doxycycline (VIBRA-TABS) 100 MG tablet; Take 1 tablet (100 mg total) by mouth 2 (two) times daily. -     albuterol (PROVENTIL) (2.5 MG/3ML) 0.083% nebulizer solution; Take 3 mLs (2.5 mg total) by nebulization every 6 (six) hours as needed for wheezing or shortness of breath. -     ALPRAZolam (XANAX) 0.5 MG tablet; Take 1 tablet (0.5 mg total) by mouth at bedtime as needed for anxiety.

## 2018-03-26 NOTE — Patient Instructions (Signed)
Recommendations:  Begin doxycycline 1 tablet twice daily antibiotic for 1 week  Begin prednisone steroid taper as directed on the label x1 week  Drink plenty of water throughout the day  You can use Mucinex DM for cough and congestion  You can use salt water gargles and warm fluids to clear mucus out of the back of the throat  You could also do nasal saline to flush out the nasal passages since that is draining down causing some hoarseness in the voice  Continue your Advair as usual  Use your albuterol rescue inhaler or albuterol nebulizer every 4-6 hours  If not much improved by the end of the week then call back

## 2018-04-03 ENCOUNTER — Ambulatory Visit: Payer: BLUE CROSS/BLUE SHIELD | Admitting: Gynecology

## 2018-04-03 ENCOUNTER — Encounter: Payer: Self-pay | Admitting: Gynecology

## 2018-04-03 VITALS — BP 120/78

## 2018-04-03 DIAGNOSIS — R102 Pelvic and perineal pain: Secondary | ICD-10-CM | POA: Diagnosis not present

## 2018-04-03 DIAGNOSIS — N951 Menopausal and female climacteric states: Secondary | ICD-10-CM

## 2018-04-03 NOTE — Patient Instructions (Signed)
Office will call you with the blood test results. 

## 2018-04-03 NOTE — Progress Notes (Signed)
    Cassidy Tran [birth year] [id number]        43 y.o.  G0P0 presents in follow-up from her recent annual exam visit where she was having pelvic pain.  Saw urology who started her on a bladder antispasmodic although she cannot remember the name of it and reports that her pain is much better.  She has developed over the last several weeks increasing hot flashes, night sweats and sleep disturbances.  Believes that her mother underwent early menopause.  No skin, hair or weight changes  Past medical history,surgical history, problem list, medications, allergies, family history and social history were all reviewed and documented in the EPIC chart.  Directed ROS with pertinent positives and negatives documented in the history of present illness/assessment and plan.  Exam: [name] assistant Vitals:   04/03/18 0913  BP: 120/78  Pulse 84 General appearance:  Normal HEENT normal with no visual or palpable abnormalities with her thyroid. Lungs clear Cardiac regular rate no rubs murmurs or gallops Abdomen soft nontender without masses guarding rebound Pelvic bimanual without masses or tenderness  Assessment/Plan:  43 y.o. G0P0 with improved pelvic pain felt to be bladder in etiology.  We will continue to follow-up with urology as needed.  Now with menopausal type symptoms.  Will check baseline CBC, CMP, thyroid panel and FSH.  We discussed various treatment options for her symptoms if she is in premature menopause.  The use of low-dose estrogen was reviewed.  Risks to include increased risk of thrombosis such as stroke heart attack DVT as well as the breast cancer issue was all discussed.  Will readdress after her blood test results return.    [name] MD, 9:31 AM 04/03/2018

## 2018-04-04 ENCOUNTER — Telehealth: Payer: Self-pay

## 2018-04-04 ENCOUNTER — Other Ambulatory Visit: Payer: Self-pay | Admitting: Gynecology

## 2018-04-04 DIAGNOSIS — D72828 Other elevated white blood cell count: Secondary | ICD-10-CM

## 2018-04-04 LAB — CBC WITH DIFFERENTIAL/PLATELET
Absolute Monocytes: 788 cells/uL (ref 200–950)
Basophils Absolute: 53 cells/uL (ref 0–200)
Basophils Relative: 0.3 %
Eosinophils Absolute: 18 cells/uL (ref 15–500)
Eosinophils Relative: 0.1 %
HCT: 40.7 % (ref 35.0–45.0)
Hemoglobin: 13.5 g/dL (ref 11.7–15.5)
Lymphs Abs: 4778 cells/uL — ABNORMAL HIGH (ref 850–3900)
MCH: 29.9 pg (ref 27.0–33.0)
MCHC: 33.2 g/dL (ref 32.0–36.0)
MCV: 90.2 fL (ref 80.0–100.0)
MPV: 9.8 fL (ref 7.5–12.5)
Monocytes Relative: 4.5 %
NEUTROS ABS: 11865 {cells}/uL — AB (ref 1500–7800)
Neutrophils Relative %: 67.8 %
Platelets: 400 10*3/uL (ref 140–400)
RBC: 4.51 10*6/uL (ref 3.80–5.10)
RDW: 13.4 % (ref 11.0–15.0)
Total Lymphocyte: 27.3 %
WBC: 17.5 10*3/uL — ABNORMAL HIGH (ref 3.8–10.8)

## 2018-04-04 LAB — COMPREHENSIVE METABOLIC PANEL
AG Ratio: 1.6 (calc) (ref 1.0–2.5)
ALT: 14 U/L (ref 6–29)
AST: 10 U/L (ref 10–30)
Albumin: 4.2 g/dL (ref 3.6–5.1)
Alkaline phosphatase (APISO): 98 U/L (ref 31–125)
BUN: 16 mg/dL (ref 7–25)
CO2: 25 mmol/L (ref 20–32)
Calcium: 9.1 mg/dL (ref 8.6–10.2)
Chloride: 103 mmol/L (ref 98–110)
Creat: 0.66 mg/dL (ref 0.50–1.10)
GLOBULIN: 2.6 g/dL (ref 1.9–3.7)
Glucose, Bld: 95 mg/dL (ref 65–99)
Potassium: 4 mmol/L (ref 3.5–5.3)
Sodium: 141 mmol/L (ref 135–146)
Total Bilirubin: 0.4 mg/dL (ref 0.2–1.2)
Total Protein: 6.8 g/dL (ref 6.1–8.1)

## 2018-04-04 LAB — THYROID PANEL WITH TSH
Free Thyroxine Index: 3 (ref 1.4–3.8)
T3 Uptake: 35 % (ref 22–35)
T4, Total: 8.6 ug/dL (ref 5.1–11.9)
TSH: 2.04 mIU/L

## 2018-04-04 LAB — FOLLICLE STIMULATING HORMONE: FSH: 33.1 m[IU]/mL

## 2018-04-04 MED ORDER — ESTRADIOL 0.05 MG/24HR TD PTTW: 1.0000 | MEDICATED_PATCH | TRANSDERMAL | 9 refills | Status: DC

## 2018-04-04 NOTE — Telephone Encounter (Signed)
Opened in error

## 2018-04-05 ENCOUNTER — Telehealth: Payer: Self-pay

## 2018-04-05 NOTE — Telephone Encounter (Signed)
Patient advised.

## 2018-04-05 NOTE — Telephone Encounter (Signed)
I am not sure I would blame the patch for that.  Oral estrogen can cause nausea particularly in birth control pill strength.  But a low dose and a patch for hormone replacement therapy it would be unusual.  I would retry the patch again after her stomach symptoms all clear.

## 2018-04-05 NOTE — Telephone Encounter (Signed)
Patient called because she put the patch on for the first time last night. She complained that it made her sick. Upon questioning she has had nausea all night and was sleepless waking every couple of hours. She stayed home from work today with the nausea trying to get her "stomach to settle down".

## 2018-04-15 ENCOUNTER — Ambulatory Visit: Payer: BLUE CROSS/BLUE SHIELD | Admitting: Family Medicine

## 2018-04-15 ENCOUNTER — Ambulatory Visit
Admission: RE | Admit: 2018-04-15 | Discharge: 2018-04-15 | Disposition: A | Discharge status: Home / Self Care | Payer: BLUE CROSS/BLUE SHIELD | Source: Ambulatory Visit | Attending: Family Medicine | Admitting: Family Medicine

## 2018-04-15 ENCOUNTER — Encounter: Payer: Self-pay | Admitting: Family Medicine

## 2018-04-15 VITALS — BP 124/82 | HR 102 | Temp 98.6°F | Resp 18 | Wt 211.2 lb

## 2018-04-15 DIAGNOSIS — R058 Other specified cough: Secondary | ICD-10-CM

## 2018-04-15 DIAGNOSIS — R05 Cough: Secondary | ICD-10-CM | POA: Diagnosis not present

## 2018-04-15 DIAGNOSIS — J4550 Severe persistent asthma, uncomplicated: Secondary | ICD-10-CM | POA: Diagnosis not present

## 2018-04-15 MED ORDER — LEVOFLOXACIN 500 MG PO TABS: 500.0000 mg | ORAL_TABLET | Freq: Every day | ORAL | 0 refills | Status: DC

## 2018-04-15 MED ORDER — ALBUTEROL SULFATE (2.5 MG/3ML) 0.083% IN NEBU
2.5000 mg | INHALATION_SOLUTION | Freq: Once | RESPIRATORY_TRACT | Status: AC
  Administered 2018-04-15: 2.5 mg via RESPIRATORY_TRACT

## 2018-04-15 NOTE — Patient Instructions (Addendum)
Start the new antibiotic Levaquin once daily.  Take Mucinex twice daily for the next week.  Continue taking your Advair twice daily and using albuterol every 4-6 hours as needed.  Do not smoke.  Go to [facility] imaging for your chest x-ray. We will call you with these results.  Follow-up with Dorothea Ogle, PA in 7 to 10 days.  You may need to also schedule with your asthma specialist.

## 2018-04-15 NOTE — Progress Notes (Signed)
Chief Complaint  Patient presents with  . cough    cough for a couple weeks. was given doxyclline and no relief. ribs hurt.     Subjective:  Cassidy Tran is a 43 y.o. female with a history of severe persistent asthma who presents for a 3 week history of persistent cough. Cough is dry.  Seen by her PCP on March 26, 2018 for similar symptoms. Reports that she completed a course of Doxycline and steroids. States she felt like she was getting slightly better but then actually is worse today. Bilateral rib pain that is painful with coughing. Denies any worsening shortness of breath but does have chest tightness and wheezing.   States she is using Advair twice daily. She used her nebulizer last night and reports minimal relief. She has not used her albuterol inhaler yesterday or today.   Smoker. States she has been smoking less since being sick.   Denies fever, chills, ear pain, sore throat, palpitations, worsening shortness of breath, abdominal pain, N/V/D.   No other aggravating or relieving factors.  No other c/o.  ROS as in subjective.   Objective: Vitals:   04/15/18 1006 04/15/18 1102  BP: 124/82   Pulse: (!) 102   Resp: 18   Temp: 98.6 F (37 C)   SpO2: 92% 96%    General appearance: Alert, WD/WN, no distress, mildly ill appearing                             Skin: warm, no rash                           Head: no sinus tenderness                            Eyes: conjunctiva normal, corneas clear, PERRLA                            Ears: pearly TMs, external ear canals normal                          Nose: septum midline, turbinates swollen, with erythema and clear discharge             Mouth/throat: MMM, tongue normal, mild pharyngeal erythema                           Neck: supple, no adenopathy, no thyromegaly, nontender                          Heart: RRR, normal S1, S2, no murmurs                         Lungs: expiratory wheezes throughout, scattered rhonchi (post  breathing albuterol breathing tx, lungs clear, no wheezing). Normal work of breathing. Speaking in full sentences without difficulty.       Assessment: Cough present for greater than 3 weeks - Plan: levofloxacin (LEVAQUIN) 500 MG tablet, albuterol (PROVENTIL) (2.5 MG/3ML) 0.083% nebulizer solution 2.5 mg  Severe persistent asthmatic bronchitis without complication - Plan: DG Chest 2 View, levofloxacin (LEVAQUIN) 500 MG tablet    Plan: Peak flow improved minimally but oxygen saturation did improve as well as lung  sounds post albuterol breathing tx.  No acute distress.  Discussed diagnosis and treatment of persistent cough and severe asthma. She did not get expected results with Doxycycline and prednisone. Will send her for a chest XR and start her on Levaquin due to allergies and recent treatment. May take Mucinex. Continue treating asthma with Advair and albuterol.  Follow up in 7-10 days with Dorothea Ogle, PA or her asthma specialist. Review of her chart shows persistently elevated WBC count. Did not check this today as no new information will be

## 2018-04-18 ENCOUNTER — Other Ambulatory Visit: Payer: Self-pay | Admitting: Family Medicine

## 2018-04-18 ENCOUNTER — Telehealth: Payer: Self-pay | Admitting: Family Medicine

## 2018-04-18 MED ORDER — AZITHROMYCIN 250 MG PO TABS: ORAL_TABLET | ORAL | 0 refills | Status: DC

## 2018-04-18 NOTE — Telephone Encounter (Signed)
I called pt back and she states she has never had any blood clots, no pain or swelling in calf, she does have bleeding disorder Von Willebrand disease , she will start Z-pac today and call back or go to ER if she worsens or has any other issues.  Please send Rx to [facility]

## 2018-04-18 NOTE — Telephone Encounter (Signed)
I discussed this patient with changing her PCP.  Since she is not responding to the antibiotic, there could be some other underlying issue causing her symptoms.  We recommend that she be seen again today either here or if she is having severe shortness of breath or chest pain she may want to consider going to the emergency department.  Please call and find out.  Thanks

## 2018-04-18 NOTE — Telephone Encounter (Signed)
I am ok switching her antibiotic to a Z-pak if she really feels like this will help her. She is on a very good antibiotic currently which is why I am concerned that she is not improving.  If she develops worsening chest pain or shortness of breath, she needs to be seen right away either here or in the ED.  Does she have any risk factors for blood clots or has she ever had one? Does she have calf swelling or pain in her calves? An underlying blood clot in her lung is always a consideration but if she does not have a history of this or is not having leg pain or swelling then this is less likely.  We also have to consider her heart function as a possibility for her symptoms if she does not respond to treatment.

## 2018-04-18 NOTE — Telephone Encounter (Signed)
Called pt back and she states today is the first day she has went back to work and she can't possible come in until late this afternoon.  States she is no worse, just no better at all.  She has been using all meds, the inhaler, the nebulizer and antibiotic and just feels like it's not helping at all.  Wants a Zpac or a different antibiotic called in.  Or can come in late today.  Advised if worsens need to go to ER and she says she is not worse, just not any better.

## 2018-04-18 NOTE — Telephone Encounter (Signed)
Pt states antibiotic doesn't seem to be helping, pt is no better, same symptoms, chest tightness, coughing, low energy, bronchitis.  No relief.  Wants something else called into [facility].  I could hear very labored breathing on the phone.

## 2018-04-19 ENCOUNTER — Other Ambulatory Visit: Payer: Self-pay | Admitting: Allergy and Immunology

## 2018-04-23 ENCOUNTER — Ambulatory Visit: Payer: BLUE CROSS/BLUE SHIELD | Admitting: Medical

## 2018-05-13 ENCOUNTER — Encounter: Payer: Self-pay | Admitting: Family Medicine

## 2018-10-10 ENCOUNTER — Other Ambulatory Visit: Payer: Self-pay | Admitting: Allergy and Immunology

## 2018-11-19 ENCOUNTER — Encounter: Payer: Self-pay | Admitting: Gynecology

## 2018-12-22 ENCOUNTER — Other Ambulatory Visit: Payer: Self-pay | Admitting: Gynecology

## 2018-12-23 NOTE — Telephone Encounter (Signed)
Annual exam on 01/10/19

## 2019-01-09 ENCOUNTER — Other Ambulatory Visit: Payer: Self-pay

## 2019-01-10 ENCOUNTER — Encounter: Payer: Self-pay | Admitting: Gynecology

## 2019-01-10 ENCOUNTER — Ambulatory Visit (INDEPENDENT_AMBULATORY_CARE_PROVIDER_SITE_OTHER): Payer: BC Managed Care – PPO | Admitting: Gynecology

## 2019-01-10 VITALS — BP 124/76 | Ht 63.0 in | Wt 222.0 lb

## 2019-01-10 DIAGNOSIS — J0110 Acute frontal sinusitis, unspecified: Secondary | ICD-10-CM | POA: Diagnosis not present

## 2019-01-10 DIAGNOSIS — Z01419 Encounter for gynecological examination (general) (routine) without abnormal findings: Secondary | ICD-10-CM | POA: Diagnosis not present

## 2019-01-10 DIAGNOSIS — Z79890 Hormone replacement therapy (postmenopausal): Secondary | ICD-10-CM | POA: Diagnosis not present

## 2019-01-10 IMAGING — CT CT HEAD W/O CM
3 series · 15 of 47 positions shown, 18 images · non-contrast
Comparison: Comparison made with previous MRI and CT from
11/07/2016, as well as earlier exams.

CLINICAL DATA: Initial evaluation for mental status changes,
lethargy, status post suboccipital craniectomy for Chiari 1
decompression.

EXAM:
CT HEAD WITHOUT CONTRAST
TECHNIQUE: Contiguous axial images were obtained from the base of the skull
through the vertex without intravenous contrast.

[Series 3: head 5.0 h30s · axial · 0.42mm/px · z∈[-281,-136]mm · 9 of 35 slices shown, 12 images]
[im 3/35  brain]
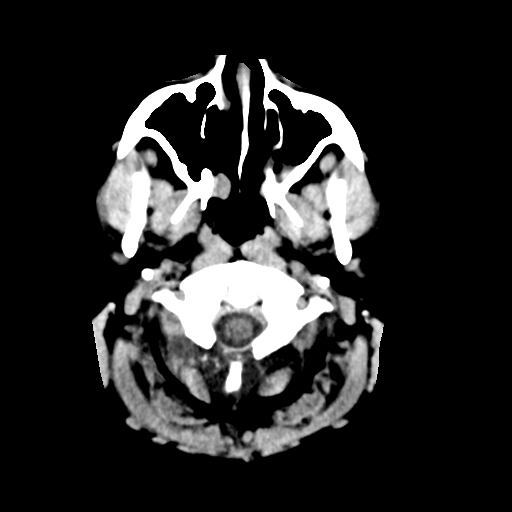
[im 3/35  bone]
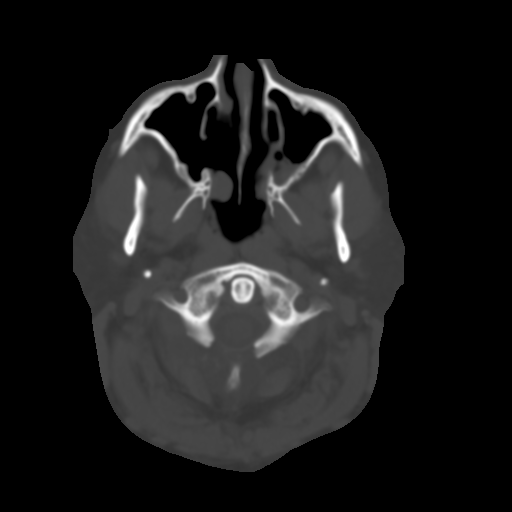
[im 6/35  brain]
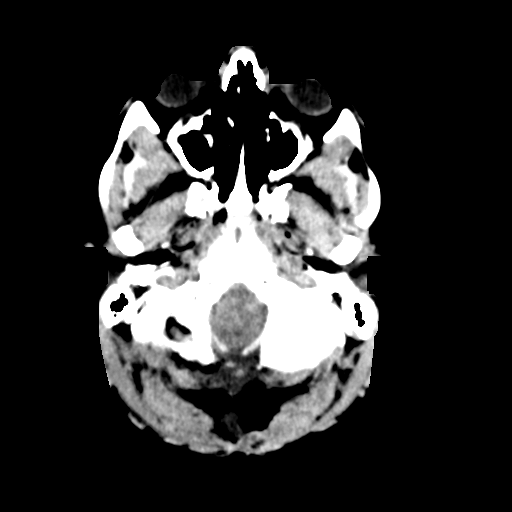
[im 10/35  brain]
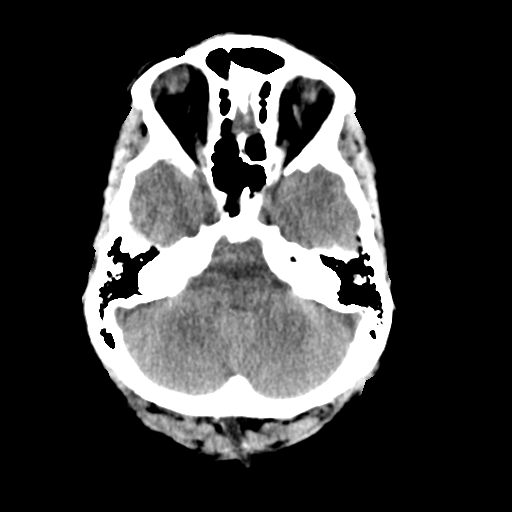
[im 13/35  brain]
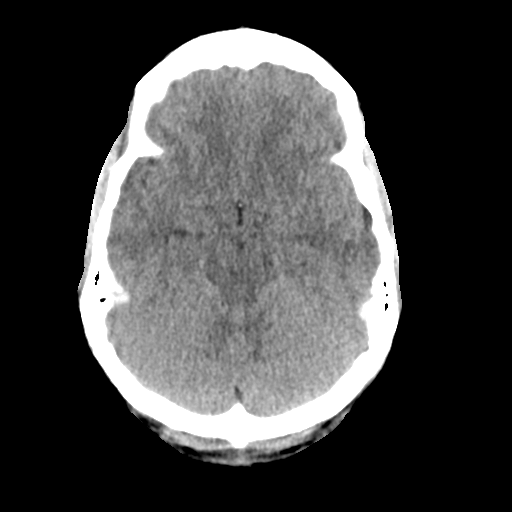
[im 18/35  brain]
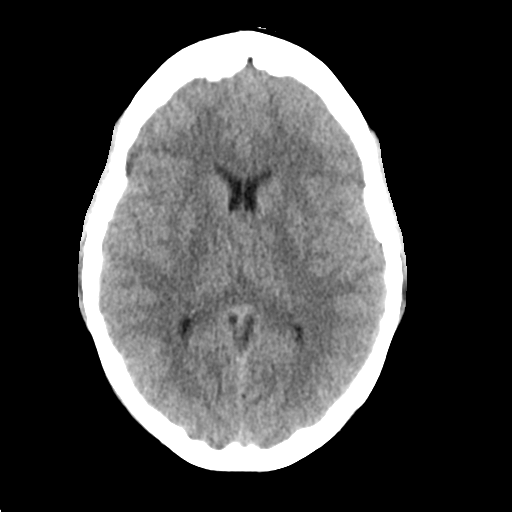
[im 18/35  bone]
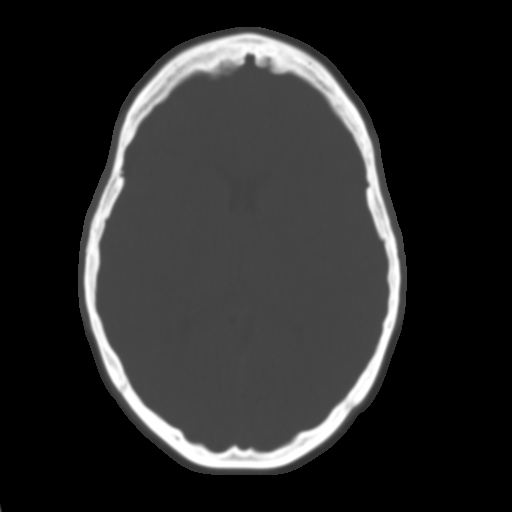
[im 22/35  brain]
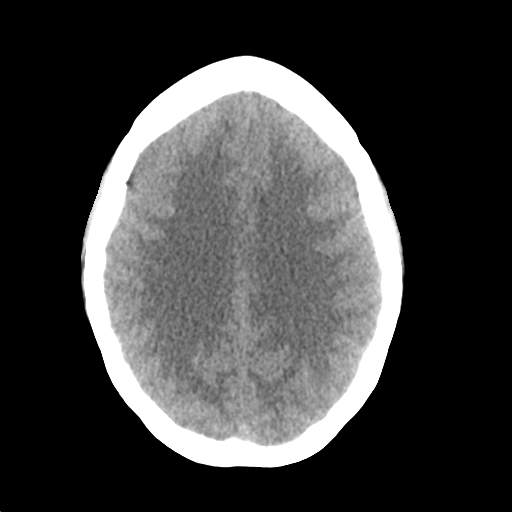
[im 25/35  brain]
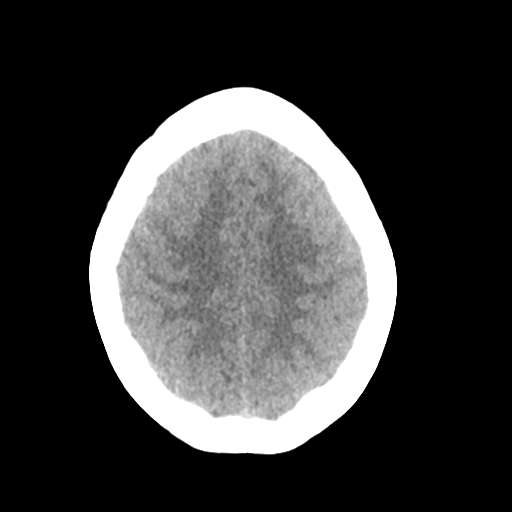
[im 29/35  brain]
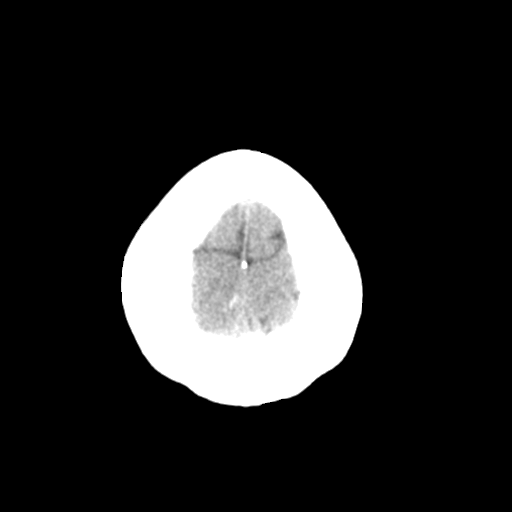
[im 32/35  brain]
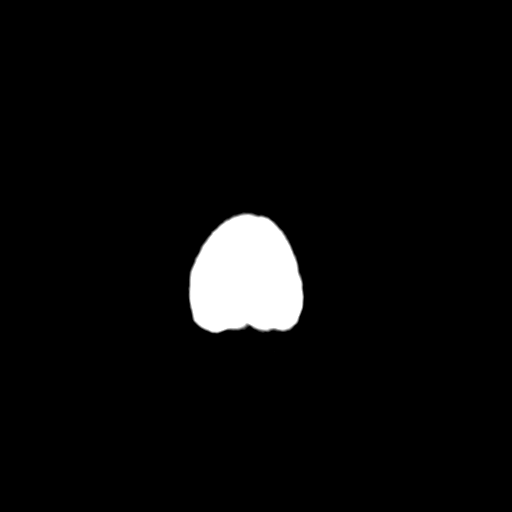
[im 32/35  bone]
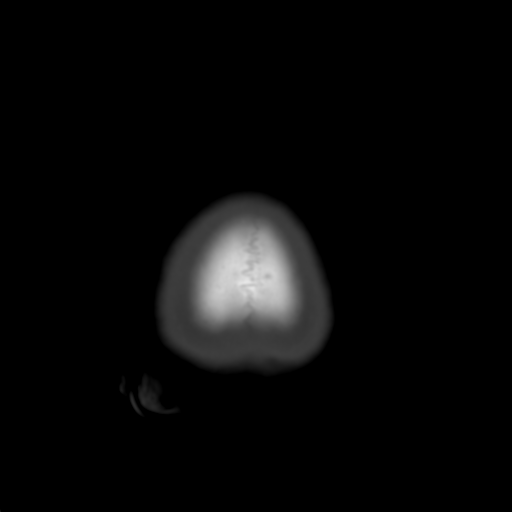

[Series 5: head 3.0 mpr cor · coronal · 0.32mm/px · 3 of 70 slices shown]
[im 24/70  brain]
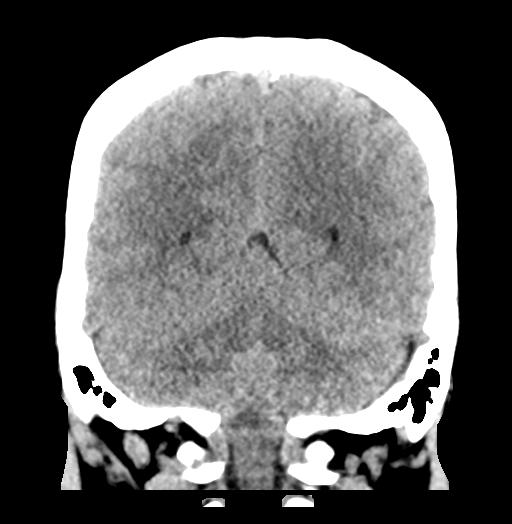
[im 31/70  brain]
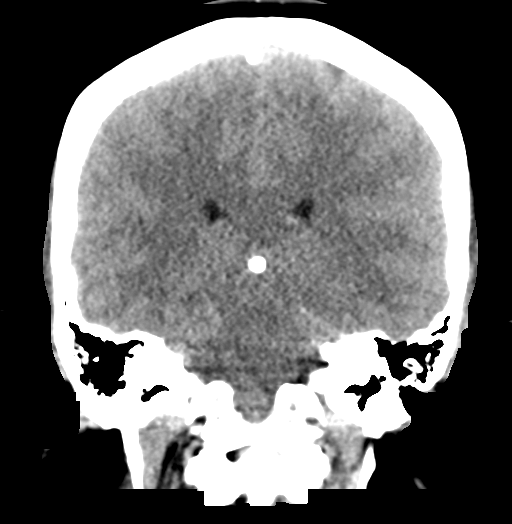
[im 39/70  brain]
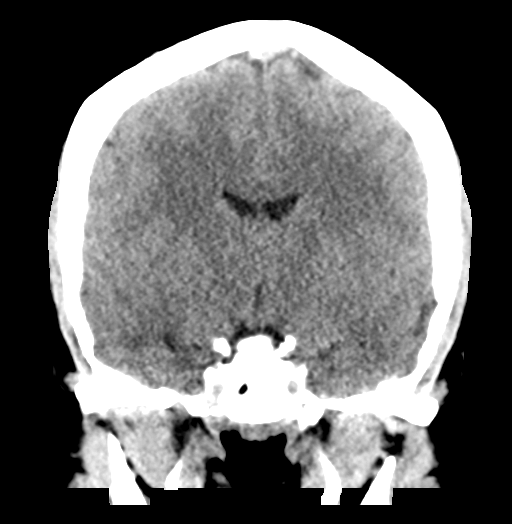

[Series 6: head 3.0 mpr sag · sagittal · 0.33mm/px · 3 of 58 slices shown]
[im 20/58  brain]
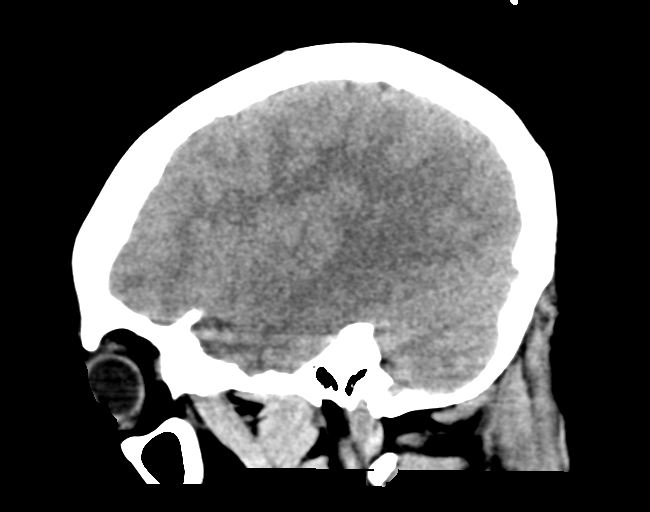
[im 29/58  brain]
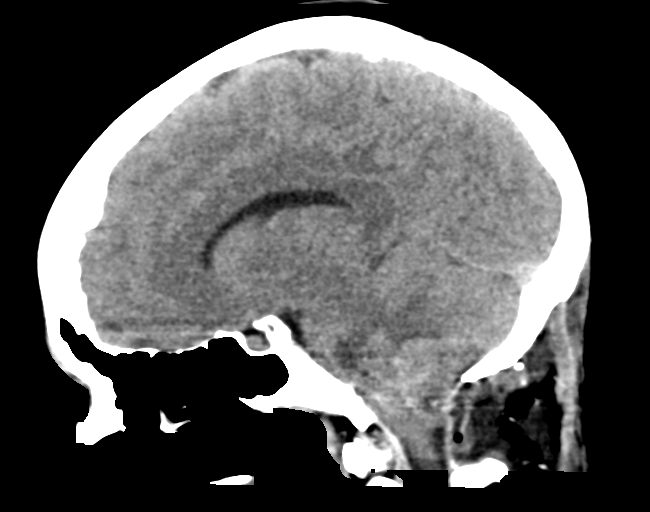
[im 39/58  brain]
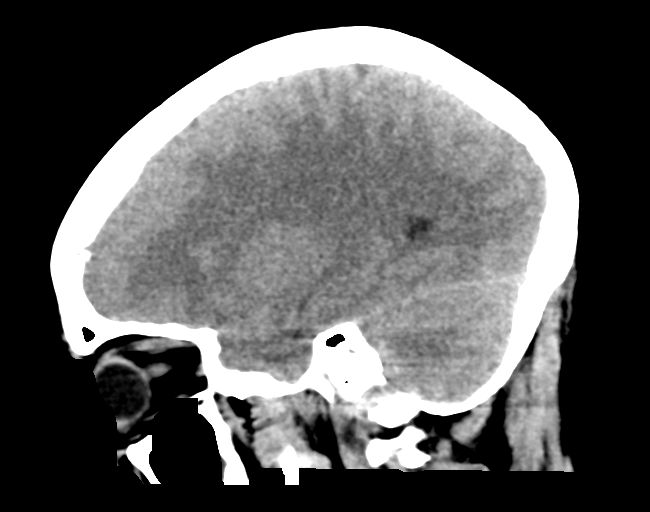

[15 of 47 positions shown; findings below may reference images not displayed]

FINDINGS: Brain: Effacement of the basilar cisterns is relatively unchanged in
appearance as compared to recent CT. Fourth ventricular size is
stable. No appreciable parenchymal edema. Lateral and third
ventricular size also relatively unchanged. Appearance of the
cerebral sulci stable.

No acute intracranial hemorrhage. No evidence for acute or evolving
large vessel territory infarct. No mass lesion, midline shift or
mass effect. No hydrocephalus. No extra-axial fluid collection.

Vascular: No abnormal hyperdense vessel.

Skull: Postoperative changes from recent decompressive suboccipital
craniectomy. Small focus of residual postoperative gas present at
the craniectomy site, decreased from previous and within normal
limits for normal expected changes. Small amount of trans spatial
fluid without significant collection. Posterior C1 resection noted
as well. No other acute abnormality about the scalp soft tissues or
cranial vault.

Sinuses/Orbits: Globes and orbital soft tissues within normal
limits. Chronic maxillary sinusitis noted without acute
exacerbation. Mastoid air cells remain clear.

Other: None.
IMPRESSION: 1. Unchanged appearance of the brain with partially effaced basilar
cisterns and fourth ventricle, stable in appearance relative to
previous CT and MRI from 11/07/2016. No hydrocephalus or other
concerning finding. No other new acute intracranial process
identified.
2. Sequelae of recent suboccipital decompression for Chiari 1
malformation without complication.

## 2019-01-10 MED ORDER — AZITHROMYCIN 250 MG PO TABS: ORAL_TABLET | ORAL | 0 refills | Status: DC

## 2019-01-10 MED ORDER — ESTRADIOL 0.05 MG/24HR TD PTTW: MEDICATED_PATCH | TRANSDERMAL | Status: DC

## 2019-01-10 NOTE — Progress Notes (Signed)
    Cassidy Tran [birth year] [id number]        43 y.o.  G0P0 for annual gynecologic exam.  Complaining of left frontal sinusitis.  History of same before.  Awoke this morning with throbbing discomfort radiating into her teeth.  No fever or chills.  Also started on estradiol patch due to menopausal symptoms earlier this year with [facility] beginning to rise at 1.  Has done well with this.  Past medical history,surgical history, problem list, medications, allergies, family history and social history were all reviewed and documented as reviewed in the EPIC chart.  ROS:  Performed with pertinent positives and negatives included in the history, assessment and plan.   Additional significant findings : None   Exam: [name] assistant Vitals:   01/10/19 1527  BP: 124/76  Weight: 222 lb (100.7 kg)  Height: 5\' 3"  (1.6 m)   Body mass index is 39.33 kg/m.  General appearance:  Normal affect, orientation and appearance. Skin: Grossly normal HEENT: Without gross lesions.  No cervical or supraclavicular adenopathy. Thyroid normal.  Tender in the suborbital sinus region to palpation.  No overlying cellulitis. Lungs:  Clear without wheezing, rales or rhonchi Cardiac: RR, without RMG Abdominal:  Soft, nontender, without masses, guarding, rebound, organomegaly or hernia Breasts:  Examined lying and sitting without masses, retractions, discharge or axillary adenopathy. Pelvic:  Ext, BUS, Vagina: Normal  Adnexa: Without masses or tenderness    Anus and perineum: Normal   Rectovaginal: Normal sphincter tone without palpated masses or tenderness.    Assessment/Plan:  43 y.o. G0P0 female for annual gynecologic exam.  Status post TLH for endometriosis  1. HRT.  Started on Vivelle 0.05 mg patch twice weekly earlier this year due to menopausal symptoms.  Doing well with this.  We have discussed the risks to include thrombosis such as stroke heart attack DVT in the breast cancer issue.  Refill x1 year  provided. 2. Sinusitis.  History of same before.  Z-Pak prescribed.  Follow-up if symptoms persist. 3. Pap smear 12/2016.  No Pap smear done today.  History of LGSIL 2012.  ASCUS with positive high-risk HPV 2013.  Normal Pap smears since with negative intervening HPV.  Plan repeat Pap smear next year at 3-year interval. 4. Mammography 2018.  Reminded patient she is overdue would she agrees to schedule.  Breast exam normal today. 5. Health maintenance.  No routine lab work done as patient is planning to see a primary physician in follow-up and will have lab work done there.  Follow-up 1 year, sooner as needed.   [name] MD, 3:49 PM 01/10/2019

## 2019-01-10 NOTE — Patient Instructions (Signed)
Take the Z-Pak as prescribed for the sinusitis.  Follow-up if your symptoms persist.  Schedule your mammogram.  Follow-up in 1 year for annual exam

## 2019-04-08 ENCOUNTER — Other Ambulatory Visit: Payer: Self-pay | Admitting: Neurological Surgery

## 2019-04-08 DIAGNOSIS — M5412 Radiculopathy, cervical region: Secondary | ICD-10-CM

## 2019-04-09 ENCOUNTER — Encounter: Payer: Self-pay | Admitting: Radiology

## 2019-05-02 ENCOUNTER — Other Ambulatory Visit: Payer: BLUE CROSS/BLUE SHIELD

## 2019-05-28 ENCOUNTER — Other Ambulatory Visit: Payer: Self-pay

## 2019-05-28 ENCOUNTER — Ambulatory Visit
Admission: RE | Admit: 2019-05-28 | Discharge: 2019-05-28 | Disposition: A | Discharge status: Home / Self Care | Payer: 59 | Source: Ambulatory Visit | Attending: Neurological Surgery | Admitting: Neurological Surgery

## 2019-05-28 DIAGNOSIS — M5412 Radiculopathy, cervical region: Secondary | ICD-10-CM

## 2020-01-16 ENCOUNTER — Encounter: Payer: BC Managed Care – PPO | Admitting: Obstetrics and Gynecology

## 2020-01-16 ENCOUNTER — Encounter: Payer: BC Managed Care – PPO | Admitting: Gynecology

## 2020-01-26 ENCOUNTER — Encounter: Payer: Self-pay | Admitting: Obstetrics and Gynecology

## 2020-01-26 ENCOUNTER — Ambulatory Visit (INDEPENDENT_AMBULATORY_CARE_PROVIDER_SITE_OTHER): Payer: No Typology Code available for payment source | Admitting: Obstetrics and Gynecology

## 2020-01-26 ENCOUNTER — Other Ambulatory Visit: Payer: Self-pay

## 2020-01-26 VITALS — BP 136/88 | Ht 63.0 in | Wt 216.0 lb

## 2020-01-26 DIAGNOSIS — Z79890 Hormone replacement therapy (postmenopausal): Secondary | ICD-10-CM

## 2020-01-26 DIAGNOSIS — Z01419 Encounter for gynecological examination (general) (routine) without abnormal findings: Secondary | ICD-10-CM | POA: Diagnosis not present

## 2020-01-26 DIAGNOSIS — Z13220 Encounter for screening for lipoid disorders: Secondary | ICD-10-CM | POA: Diagnosis not present

## 2020-01-26 DIAGNOSIS — Z1329 Encounter for screening for other suspected endocrine disorder: Secondary | ICD-10-CM

## 2020-01-26 MED ORDER — ESTRADIOL 0.05 MG/24HR TD PTTW: MEDICATED_PATCH | TRANSDERMAL | 4 refills | Status: DC

## 2020-01-26 NOTE — Progress Notes (Signed)
Cassidy Tran [birth year] [id number]  SUBJECTIVE:  44 y.o. G0P0 female for annual routine gynecologic exam and Pap smear. She has no gynecologic concerns.  Current Outpatient Medications  Medication Sig Dispense Refill  . acetaminophen (TYLENOL) 500 MG tablet Take 1,000 mg by mouth every 6 (six) hours as needed for mild pain or moderate pain. pain     . ADVAIR HFA 230-21 MCG/ACT inhaler INHALE 2 PUFFS BY MOUTH INTO THE LUNGS TWICE DAILY 12 g 0  . albuterol (PROVENTIL) (2.5 MG/3ML) 0.083% nebulizer solution Take 3 mLs (2.5 mg total) by nebulization every 6 (six) hours as needed for wheezing or shortness of breath. 75 mL 1  . divalproex (DEPAKOTE ER) 500 MG 24 hr tablet Take 1,000 mg by mouth 2 (two) times daily.    [name] estradiol (VIVELLE-DOT) 0.05 MG/24HR patch APPLY 1 PATCH(0.05 MG) EXTERNALLY TO THE SKIN 2 TIMES A WEEK 24 patch For  . gabapentin (NEURONTIN) 300 MG capsule TK 2 CS PO HS FOR POSTERIOR HEAD PAINS    . meclizine (ANTIVERT) 25 MG tablet TAKE 1 TABLET BY MOUTH THREE TIMES DAILY AS NEEDED FOR DIZZINESS 20 tablet 0  . mometasone (NASONEX) 50 MCG/ACT nasal spray Use one to two sprays in each nostril once daily 17 g 5  . montelukast (SINGULAIR) 10 MG tablet TAKE 1 TABLET BY MOUTH AT BEDTIME 30 tablet 0  . PROAIR HFA 108 (90 Base) MCG/ACT inhaler INHALE 2 PUFFS INTO THE LUNGS EVERY 4 HOURS AS NEEDED FOR WHEEZING OR SHORTNESS OF BREATH 8.5 g 0  . zonisamide (ZONEGRAN) 100 MG capsule Take 100 mg by mouth 2 (two) times daily.      No current facility-administered medications for this visit.   Facility-Administered Medications Ordered in Other Visits  Medication Dose Route Frequency Provider Last Rate Last Admin  . desmopressin (DDAVP) injection 34.04 mcg  0.3 mcg/kg (Order-Specific) Intravenous Once Fontaine, Belinda Block, MD       Allergies: Aspirin, Codeine, Lactose, Lactose intolerance (gi), Nsaids, Penicillins, and Erythromycin  Patient's last menstrual period was  09/16/2012.  Past medical history,surgical history, problem list, medications, allergies, family history and social history were all reviewed and documented as reviewed in the EPIC chart.  ROS: Pertinent positives and negatives as reviewed in HPI    OBJECTIVE:  BP 136/88   Ht 5\' 3"  (1.6 m)   Wt 216 lb (98 kg)   LMP 09/16/2012   BMI 38.26 kg/m  The patient appears well, alert, oriented, in no distress. ENT normal.  Neck supple. No cervical or supraclavicular adenopathy or thyromegaly.  Lungs are clear, good air entry, no wheezes, rhonchi or rales. S1 and S2 normal, no murmurs, regular rate and rhythm.  Abdomen soft without tenderness, guarding, mass or organomegaly.  Neurological is normal, no focal findings.  BREAST EXAM: breasts appear normal, no suspicious masses, no skin or nipple changes or axillary nodes  PELVIC EXAM: VULVA: normal appearing vulva with no masses, tenderness or lesions, VAGINA: normal appearing vagina with normal color and discharge, no lesions, CERVIX: surgically absent, UTERUS: surgically absent, vaginal cuff normal, ADNEXA: no masses, nontender, PAP: Pap smear done today, thin-prep method  Chaperone: Thurnell Garbe present during the examination  ASSESSMENT:  44 y.o. G0P0 here for annual gynecologic exam  PLAN:   1. Postmenopausal/HRT. Prior TLH for endometriosis.   Started on Vivelle 0.05 mg patch twice weekly in 2020 due to menopausal symptoms. Has good control of her symptoms overall with HRT. We discussed thrombotic risks with use, especially with  being a smoker (which theoretically would elevate the risks), include heart attack, stroke, DVT, PE, and the breast cancer issue. At this point she feels the benefits outweigh the risks and she would like to continue. Refill x1 year provided. 2. Pap smear 12/2016.  History LGSIL in 2012, ASCUS positive high-risk HPV 2013.  Normal Pap smears since that time.  Pap smear from vaginal cuff collected today. 3.  Mammogram 2018.  Normal breast exam today. She is aware that she is overdue and she agrees to schedule soon.  4. Health maintenance.  She will proceed to lab today for routine screening blood work (lipids, CBC, CMP, TSH).  Return annually or sooner, prn.  Joseph Pierini MD 01/26/20

## 2020-01-26 NOTE — Addendum Note (Signed)
Addended by: Thurnell Garbe A on: 01/26/2020 10:23 AM   Modules accepted: Orders

## 2020-01-27 LAB — CBC
HCT: 42.1 % (ref 35.0–45.0)
Hemoglobin: 14 g/dL (ref 11.7–15.5)
MCH: 29.8 pg (ref 27.0–33.0)
MCHC: 33.3 g/dL (ref 32.0–36.0)
MCV: 89.6 fL (ref 80.0–100.0)
MPV: 10.2 fL (ref 7.5–12.5)
Platelets: 368 10*3/uL (ref 140–400)
RBC: 4.7 10*6/uL (ref 3.80–5.10)
RDW: 13.3 % (ref 11.0–15.0)
WBC: 10.6 10*3/uL (ref 3.8–10.8)

## 2020-01-27 LAB — PAP IG W/ RFLX HPV ASCU

## 2020-01-27 LAB — COMPREHENSIVE METABOLIC PANEL
AG Ratio: 1.6 (calc) (ref 1.0–2.5)
ALT: 24 U/L (ref 6–29)
AST: 17 U/L (ref 10–35)
Albumin: 4.4 g/dL (ref 3.6–5.1)
Alkaline phosphatase (APISO): 106 U/L (ref 31–125)
BUN: 18 mg/dL (ref 7–25)
CO2: 26 mmol/L (ref 20–32)
Calcium: 9 mg/dL (ref 8.6–10.2)
Chloride: 103 mmol/L (ref 98–110)
Creat: 0.73 mg/dL (ref 0.50–1.10)
Globulin: 2.8 g/dL (calc) (ref 1.9–3.7)
Glucose, Bld: 84 mg/dL (ref 65–99)
Potassium: 4.2 mmol/L (ref 3.5–5.3)
Sodium: 139 mmol/L (ref 135–146)
Total Bilirubin: 0.5 mg/dL (ref 0.2–1.2)
Total Protein: 7.2 g/dL (ref 6.1–8.1)

## 2020-01-27 LAB — LIPID PANEL
Cholesterol: 291 mg/dL — ABNORMAL HIGH (ref <200)
HDL: 52 mg/dL (ref >=50)
LDL Cholesterol (Calc): 201 mg/dL (calc) — ABNORMAL HIGH
Non-HDL Cholesterol (Calc): 239 mg/dL (calc) — ABNORMAL HIGH (ref <130)
Total CHOL/HDL Ratio: 5.6 (calc) — ABNORMAL HIGH (ref <5.0)
Triglycerides: 202 mg/dL — ABNORMAL HIGH (ref <150)

## 2020-01-27 LAB — TSH: TSH: 3.61 mIU/L

## 2020-05-17 ENCOUNTER — Other Ambulatory Visit: Payer: Self-pay | Admitting: Neurological Surgery

## 2020-05-17 DIAGNOSIS — G910 Communicating hydrocephalus: Secondary | ICD-10-CM

## 2020-06-07 ENCOUNTER — Other Ambulatory Visit: Payer: Self-pay

## 2020-06-07 ENCOUNTER — Ambulatory Visit
Admission: RE | Admit: 2020-06-07 | Discharge: 2020-06-07 | Disposition: A | Discharge status: Home / Self Care | Payer: Self-pay | Source: Ambulatory Visit | Attending: Neurological Surgery | Admitting: Neurological Surgery

## 2020-06-07 DIAGNOSIS — G910 Communicating hydrocephalus: Secondary | ICD-10-CM

## 2020-08-09 ENCOUNTER — Other Ambulatory Visit (HOSPITAL_COMMUNITY): Payer: Self-pay | Admitting: Family Medicine

## 2020-08-09 DIAGNOSIS — R197 Diarrhea, unspecified: Secondary | ICD-10-CM

## 2020-08-09 DIAGNOSIS — R1011 Right upper quadrant pain: Secondary | ICD-10-CM

## 2020-08-19 ENCOUNTER — Other Ambulatory Visit: Payer: Self-pay

## 2020-08-19 ENCOUNTER — Ambulatory Visit (HOSPITAL_COMMUNITY)
Admission: RE | Admit: 2020-08-19 | Discharge: 2020-08-19 | Disposition: A | Discharge status: Home / Self Care | Payer: No Typology Code available for payment source | Source: Ambulatory Visit | Attending: Family Medicine | Admitting: Family Medicine

## 2020-08-19 DIAGNOSIS — R197 Diarrhea, unspecified: Secondary | ICD-10-CM | POA: Insufficient documentation

## 2020-08-19 DIAGNOSIS — R1011 Right upper quadrant pain: Secondary | ICD-10-CM

## 2020-08-19 MED ORDER — TECHNETIUM TC 99M MEBROFENIN IV KIT
5.5000 | PACK | Freq: Once | INTRAVENOUS | Status: AC | PRN
  Administered 2020-08-19: 5.5 via INTRAVENOUS

## 2020-12-07 ENCOUNTER — Encounter: Payer: Self-pay | Admitting: Gastroenterology

## 2020-12-16 ENCOUNTER — Other Ambulatory Visit (INDEPENDENT_AMBULATORY_CARE_PROVIDER_SITE_OTHER): Payer: No Typology Code available for payment source

## 2020-12-16 ENCOUNTER — Encounter: Payer: Self-pay | Admitting: Gastroenterology

## 2020-12-16 ENCOUNTER — Other Ambulatory Visit: Payer: Self-pay

## 2020-12-16 ENCOUNTER — Ambulatory Visit (INDEPENDENT_AMBULATORY_CARE_PROVIDER_SITE_OTHER): Payer: No Typology Code available for payment source | Admitting: Gastroenterology

## 2020-12-16 VITALS — BP 150/92 | HR 85 | Ht 63.5 in | Wt 214.5 lb

## 2020-12-16 DIAGNOSIS — G8929 Other chronic pain: Secondary | ICD-10-CM

## 2020-12-16 DIAGNOSIS — K921 Melena: Secondary | ICD-10-CM

## 2020-12-16 DIAGNOSIS — R1011 Right upper quadrant pain: Secondary | ICD-10-CM | POA: Diagnosis not present

## 2020-12-16 DIAGNOSIS — R197 Diarrhea, unspecified: Secondary | ICD-10-CM | POA: Diagnosis not present

## 2020-12-16 DIAGNOSIS — R1013 Epigastric pain: Secondary | ICD-10-CM | POA: Diagnosis not present

## 2020-12-16 DIAGNOSIS — D6801 Von willebrand disease, type 1: Secondary | ICD-10-CM

## 2020-12-16 LAB — COMPREHENSIVE METABOLIC PANEL
ALT: 16 U/L (ref 0–35)
AST: 13 U/L (ref 0–37)
Albumin: 4.3 g/dL (ref 3.5–5.2)
Alkaline Phosphatase: 115 U/L (ref 39–117)
BUN: 13 mg/dL (ref 6–23)
CO2: 30 mEq/L (ref 19–32)
Calcium: 9 mg/dL (ref 8.4–10.5)
Chloride: 102 mEq/L (ref 96–112)
Creatinine, Ser: 0.72 mg/dL (ref 0.40–1.20)
GFR: 99.99 mL/min (ref >60.00)
Glucose, Bld: 79 mg/dL (ref 70–99)
Potassium: 4.3 mEq/L (ref 3.5–5.1)
Sodium: 139 mEq/L (ref 135–145)
Total Bilirubin: 0.5 mg/dL (ref 0.2–1.2)
Total Protein: 7 g/dL (ref 6.0–8.3)

## 2020-12-16 LAB — CBC
HCT: 41.7 % (ref 36.0–46.0)
Hemoglobin: 13.7 g/dL (ref 12.0–15.0)
MCHC: 33 g/dL (ref 30.0–36.0)
MCV: 89.5 fl (ref 78.0–100.0)
Platelets: 321 10*3/uL (ref 150.0–400.0)
RBC: 4.65 Mil/uL (ref 3.87–5.11)
RDW: 15.1 % (ref 11.5–15.5)
WBC: 9.9 10*3/uL (ref 4.0–10.5)

## 2020-12-16 LAB — TSH: TSH: 2.61 u[IU]/mL (ref 0.35–5.50)

## 2020-12-16 LAB — C-REACTIVE PROTEIN: CRP: 1 mg/dL (ref 0.5–20.0)

## 2020-12-16 MED ORDER — CLENPIQ 10-3.5-12 MG-GM -GM/160ML PO SOLN: 1.0000 | ORAL | 0 refills | Status: DC

## 2020-12-16 NOTE — Patient Instructions (Signed)
If you are age 47 or older, your body mass index should be between 23-30. Your Body mass index is 37.4 kg/m. If this is out of the aforementioned range listed, please consider follow up with your Primary Care Provider.  If you are age 47 or younger, your body mass index should be between 19-25. Your Body mass index is 37.4 kg/m. If this is out of the aformentioned range listed, please consider follow up with your Primary Care Provider.   __________________________________________________________  The [place] [facility] providers would like to encourage you to use [facility] to communicate with providers for non-urgent requests or questions.  Due to long hold times on the telephone, sending your provider a message by [facility] may be a faster and more efficient way to get a response.  Please allow 48 business hours for a response.  Please remember that this is for non-urgent requests.   Please go to the lab on the 2nd floor [address] before you leave the office today.   We have sent the following medications to your pharmacy for you to pick up at your convenience:  Clenpiq for your colonoscopy  Due to recent changes in healthcare laws, you may see the results of your imaging and laboratory studies on MyChart before your provider has had a chance to review them.  We understand that in some cases there may be results that are confusing or concerning to you. Not all laboratory results come back in the same time frame and the provider may be waiting for multiple results in order to interpret others.  Please give [place] 48 hours in order for your provider to thoroughly review all the results before contacting the office for clarification of your results.   Thank you for choosing me and [facility].  Vito Cirigliano, D.O.

## 2020-12-16 NOTE — Progress Notes (Signed)
Chief Complaint: Diarrhea, change in bowel habits, abdominal pain   Referring Provider:    Janie Morning, DO   HPI:     Cassidy Tran is a 45 y.o. female with a history of anxiety, asthma, depression, endometriosis, seizure disorder, von Willebrand disease, tobacco use, hysterectomy, Chiari malformation s/p VP shunt 2018, referred to the [facility]ology Clinic for evaluation of multiple GI symptoms to include diarrhea, change in bowel habits, abdominal pain, along with discussion of colon cancer screening.  Reports having 1 year history of loose, watery stools.  Can have up to 12 BM prior to 10:00, then more through the day. Rare nocturnal stools. Has had episodes of BRB. Have tried dietary mods without improvement.  Responsive to prn Imodium which she will take before travel, etc.  Separately, has intermittent RUQ pain for last several months.  No associated nausea/vomiting.  Has separate MEG pain for the last several months as well.  Pain symptoms seem independent from each other and independent of the diarrhea.   Patient is otherwise without preceding exposures to include recent antibiotics, hospitalization, sick contacts, travel and denies new medications, supplements, OTCs.  Was seen by Dr. Harlow Asa at [facility] on 09/06/2020 for evaluation of loose stool/diarrhea and RUQ pain.  HIDA scan 08/19/2020 with EF 13%.  Ultrasound without gallstones (report not available for review).  Diagnosed with biliary dyskinesia and recommended GI referral to rule out other etiologies for diarrhea and abdominal pain prior to proceeding with cholecystectomy.   Reviewed most recent available labs from 12/2019: -Normal CBC, CMP, TSH   Past Medical History:  Diagnosis Date   Allergy    Anxiety    Arthritis    lower back   ASCUS (atypical squamous cells of undetermined significance) on Pap smear 03/2011   NEG HR HPV   Asthma    Cancer (HCC)    skin cancer- age 18ish   Cervical dysplasia,  mild 08/2010   LGSIL colposcopy biopsy showing koilocytotic atypia   Clotting disorder ([place])    Depression    Endometriosis    Headache(784.0)    High risk HPV infection 12/2011   Pap normal   Pneumonia    2016ish   Seizures (HCC)    seizure disorder   Smoker    Von Willebrand disease      Past Surgical History:  Procedure Laterality Date   APPLICATION OF CRANIAL NAVIGATION N/A 11/24/2016   Procedure: APPLICATION OF CRANIAL NAVIGATION;  Surgeon: Kristeen Miss, MD;  Location: [facility];  Service: Neurosurgery;  Laterality: N/A;   BREAST BIOPSY Left    COLPOSCOPY     CYSTOSCOPY N/A 10/14/2012   Procedure: CYSTOSCOPY;  Surgeon: [name], MD;  Location: [facility];  Service: Gynecology;  Laterality: N/A;   DILATION AND CURETTAGE OF UTERUS     HYSTEROSCOPY WITH D & C  10/14/2010   Procedure: DILATATION AND CURETTAGE (D&C) /HYSTEROSCOPY;  Surgeon: [name], MD;  Location: [facility];  Service: Gynecology;  Laterality: N/A;   LAPAROSCOPIC HYSTERECTOMY N/A 10/14/2012   Procedure: HYSTERECTOMY TOTAL LAPAROSCOPIC;  Surgeon: [name], MD;  Location: [facility];  Service: Gynecology;  Laterality: N/A;  CPT [id number]  2 1/2 hours  Dr. Uvaldo Rising to assist.   LAPAROSCOPY  04/27/2011   Procedure: LAPAROSCOPY OPERATIVE;  Surgeon: [name], MD;  Location: [facility];  Service: Gynecology;  Laterality: N/A;  removal right cyst , lysis of adhesions, biopsy of  peritoneum   NASAL SINUS SURGERY  1993   IN [place], [place]   SUBOCCIPITAL CRANIECTOMY CERVICAL LAMINECTOMY N/A 11/06/2016   Procedure: Suboccipital decompression for chiari malformation;  Surgeon: Kristeen Miss, MD;  Location: [facility];  Service: Neurosurgery;  Laterality: N/A;   TONSILLECTOMY     VENTRICULOPERITONEAL SHUNT N/A 11/24/2016   Procedure: SHUNT INSERTION VENTRICULAR-PERITONEAL with Brainlab;  Surgeon: Kristeen Miss, MD;  Location: [facility];  Service: Neurosurgery;  Laterality: N/A;  right side approach   WISDOM TOOTH  EXTRACTION  1993   Family History  Problem Relation Age of Onset   Hypertension Mother    Heart disease Mother    Hyperlipidemia Mother    Diabetes Father    Hypertension Father    Heart disease Father    Hyperlipidemia Father    Stroke Father    Prostate cancer Father    Lymphoma Sister        NON-HODGKINS LYMPHOMA   Cancer Maternal Uncle        Lung and pancreatic cancer   Colon cancer Neg Hx    Rectal cancer Neg Hx    Esophageal cancer Neg Hx    Social History   Tobacco Use   Smoking status: Every Day    Packs/day: 0.50    Years: 20.00    Pack years: 10.00    Types: Cigarettes   Smokeless tobacco: Never  Vaping Use   Vaping Use: Never used  Substance Use Topics   Alcohol use: Not Currently    Alcohol/week: 0.0 standard drinks    Comment: Rare   Drug use: No   Current Outpatient Medications  Medication Sig Dispense Refill   acetaminophen (TYLENOL) 500 MG tablet Take 1,000 mg by mouth every 6 (six) hours as needed for mild pain or moderate pain. pain      ADVAIR HFA 230-21 MCG/ACT inhaler INHALE 2 PUFFS BY MOUTH INTO THE LUNGS TWICE DAILY 12 g 0   albuterol (PROVENTIL) (2.5 MG/3ML) 0.083% nebulizer solution Take 3 mLs (2.5 mg total) by nebulization every 6 (six) hours as needed for wheezing or shortness of breath. 75 mL 1   divalproex (DEPAKOTE ER) 500 MG 24 hr tablet Take 1,000 mg by mouth 2 (two) times daily.     estradiol (VIVELLE-DOT) 0.05 MG/24HR patch APPLY 1 PATCH(0.05 MG) EXTERNALLY TO THE SKIN 2 TIMES A WEEK 24 patch 4   gabapentin (NEURONTIN) 300 MG capsule TK 2 CS PO HS FOR POSTERIOR HEAD PAINS     meclizine (ANTIVERT) 25 MG tablet TAKE 1 TABLET BY MOUTH THREE TIMES DAILY AS NEEDED FOR DIZZINESS 20 tablet 0   mometasone (NASONEX) 50 MCG/ACT nasal spray Use one to two sprays in each nostril once daily 17 g 5   montelukast (SINGULAIR) 10 MG tablet TAKE 1 TABLET BY MOUTH AT BEDTIME 30 tablet 0   PROAIR HFA 108 (90 Base) MCG/ACT inhaler INHALE 2 PUFFS INTO THE  LUNGS EVERY 4 HOURS AS NEEDED FOR WHEEZING OR SHORTNESS OF BREATH 8.5 g 0   zonisamide (ZONEGRAN) 100 MG capsule Take 100 mg by mouth 2 (two) times daily.      No current facility-administered medications for this visit.   Facility-Administered Medications Ordered in Other Visits  Medication Dose Route Frequency Provider Last Rate Last Admin   desmopressin (DDAVP) injection 34.04 mcg  0.3 mcg/kg (Order-Specific) Intravenous Once Fontaine, Belinda Block, MD       Allergies  Allergen Reactions   Aspirin Other (See Comments)    Childhood reaction  Codeine Nausea And Vomiting    Tolerates Hydrocodone   Lactose Diarrhea   Lactose Intolerance (Gi) Diarrhea   Nsaids Nausea And Vomiting   Penicillins Other (See Comments)    Has patient had a PCN reaction causing immediate rash, facial/tongue/throat swelling, SOB or lightheadedness with hypotension: doesn't remember childhood Reaction  Has patient had a PCN reaction causing severe rash involving mucus membranes or skin necrosis: NO Has patient had a PCN reaction that required hospitalization NO Has patient had a PCN reaction occurring within the last 10 years: NO If all of the above answers are "NO", then may proceed with Cephalosporin use.    Erythromycin Other (See Comments)    Childhood reaction, tolerate Zpak Childhood reaction does not know.     Review of Systems: All systems reviewed and negative except where noted in HPI.     Physical Exam:    Wt Readings from Last 3 Encounters:  12/16/20 214 lb 8 oz (97.3 kg)  01/26/20 216 lb (98 kg)  01/10/19 222 lb (100.7 kg)    BP (!) 150/92   Pulse 85   Ht 5' 3.5" (1.613 m)   Wt 214 lb 8 oz (97.3 kg)   LMP 09/16/2012   SpO2 98%   BMI 37.40 kg/m  Constitutional:  Pleasant, in no acute distress. Psychiatric: Normal mood and affect. Behavior is normal. Cardiovascular: Normal rate, regular rhythm. No edema Pulmonary/chest: Effort normal and breath sounds normal. No wheezing, rales  or rhonchi. Abdominal: Soft, nondistended, nontender. Bowel sounds active throughout. There are no masses palpable. No hepatomegaly. Neurological: Alert and oriented to person place and time. Skin: Skin is warm and dry. No rashes noted.   ASSESSMENT AND PLAN;   1) Diarrhea 2) Change in bowel habits 3) Hematochezia - EGD and colonoscopy with random and directed biopsies - ESR, CRP, CBC, CMP, TSH - GI PCR, fecal calprotectin, pancreatic elastase  4) RUQ pain - HIDA scan with e/o biliary dyskinesia.  Plan for endoscopic evaluation to rule out mucosal/luminal pathology prior to proceeding with cholecystectomy - EGD as above  5) MEG pain - EGD to evaluate for mucosal/luminal pathology with biopsies to rule out H. pylori - If endoscopic evaluation unrevealing, plan for either empiric trial of medications versus cross-sectional imaging  6) History of von Willebrand disease - Referral to Hematology for clearance and discussion of whether or not periprocedural treatment w/ DDAVP is needed - Discussed elevated bleeding risk for EGD/colonoscopy  The indications, risks, and benefits of EGD and colonoscopy were explained to the patient in detail. Risks include but are not limited to bleeding, perforation, adverse reaction to medications, and cardiopulmonary compromise. Sequelae include but are not limited to the possibility of surgery, hospitalization, and mortality. The patient verbalized understanding and wished to proceed. All questions answered, referred to scheduler and bowel prep ordered. Further recommendations pending results of the exam.     Lavena Bullion, DO, FACG  12/16/2020, 9:30 AM   Janie Morning, DO

## 2020-12-17 ENCOUNTER — Telehealth: Payer: Self-pay | Admitting: General Surgery

## 2020-12-17 NOTE — Telephone Encounter (Signed)
Spoke with the patient and advised her that her bloodwork was normal, and we were waiting on her stool studies. The patient stated she had not completed the stool tests but will bring them to [facility] lab on Monday.

## 2020-12-17 NOTE — Telephone Encounter (Signed)
-----   Message from [place], DO sent at 12/17/2020  2:47 PM EDT ----- Labs n/f the following: - Normal CBC, CMP, TSH, CRP  Will f/u on stool studies when resulted.

## 2020-12-18 LAB — ANA: Anti Nuclear Antibody (ANA): NEGATIVE

## 2021-01-04 ENCOUNTER — Telehealth: Payer: Self-pay | Admitting: General Surgery

## 2021-01-04 NOTE — Telephone Encounter (Signed)
Notified the patient of her normal labs. She also stated that she has not completed her stool studies as of today. She stated she would bring them to the lab on Monday 01/10/2021

## 2021-01-04 NOTE — Telephone Encounter (Signed)
-----   Message from Lavena Bullion, DO sent at 12/24/2020  1:53 PM EDT ----- ANA is normal/negative

## 2021-01-07 ENCOUNTER — Other Ambulatory Visit: Payer: Self-pay | Admitting: Family

## 2021-01-07 DIAGNOSIS — D6801 Von willebrand disease, type 1: Secondary | ICD-10-CM

## 2021-01-10 ENCOUNTER — Inpatient Hospital Stay: Payer: No Typology Code available for payment source

## 2021-01-10 ENCOUNTER — Inpatient Hospital Stay: Payer: No Typology Code available for payment source | Attending: Family | Admitting: Family

## 2021-01-10 ENCOUNTER — Other Ambulatory Visit: Payer: Self-pay

## 2021-01-10 ENCOUNTER — Encounter: Payer: Self-pay | Admitting: Family

## 2021-01-10 ENCOUNTER — Other Ambulatory Visit: Payer: No Typology Code available for payment source

## 2021-01-10 VITALS — BP 157/80 | HR 89 | Temp 99.0°F | Resp 18 | Ht 63.5 in | Wt 213.4 lb

## 2021-01-10 DIAGNOSIS — Z801 Family history of malignant neoplasm of trachea, bronchus and lung: Secondary | ICD-10-CM | POA: Insufficient documentation

## 2021-01-10 DIAGNOSIS — Z90710 Acquired absence of both cervix and uterus: Secondary | ICD-10-CM | POA: Diagnosis not present

## 2021-01-10 DIAGNOSIS — Z800 Family history of malignant neoplasm of digestive organs: Secondary | ICD-10-CM | POA: Insufficient documentation

## 2021-01-10 DIAGNOSIS — Z8042 Family history of malignant neoplasm of prostate: Secondary | ICD-10-CM | POA: Insufficient documentation

## 2021-01-10 DIAGNOSIS — K921 Melena: Secondary | ICD-10-CM

## 2021-01-10 DIAGNOSIS — Z807 Family history of other malignant neoplasms of lymphoid, hematopoietic and related tissues: Secondary | ICD-10-CM | POA: Insufficient documentation

## 2021-01-10 DIAGNOSIS — R197 Diarrhea, unspecified: Secondary | ICD-10-CM

## 2021-01-10 DIAGNOSIS — R1013 Epigastric pain: Secondary | ICD-10-CM

## 2021-01-10 DIAGNOSIS — D6801 Von willebrand disease, type 1: Secondary | ICD-10-CM | POA: Diagnosis present

## 2021-01-10 DIAGNOSIS — F17210 Nicotine dependence, cigarettes, uncomplicated: Secondary | ICD-10-CM | POA: Insufficient documentation

## 2021-01-10 DIAGNOSIS — R1011 Right upper quadrant pain: Secondary | ICD-10-CM

## 2021-01-10 DIAGNOSIS — G8929 Other chronic pain: Secondary | ICD-10-CM

## 2021-01-10 LAB — CBC WITH DIFFERENTIAL (CANCER CENTER ONLY)
Abs Immature Granulocytes: 0.03 10*3/uL (ref 0.00–0.07)
Basophils Absolute: 0 10*3/uL (ref 0.0–0.1)
Basophils Relative: 0 %
Eosinophils Absolute: 0.1 10*3/uL (ref 0.0–0.5)
Eosinophils Relative: 1 %
HCT: 39.8 % (ref 36.0–46.0)
Hemoglobin: 13.2 g/dL (ref 12.0–15.0)
Immature Granulocytes: 0 %
Lymphocytes Relative: 37 %
Lymphs Abs: 4.2 10*3/uL — ABNORMAL HIGH (ref 0.7–4.0)
MCH: 29.7 pg (ref 26.0–34.0)
MCHC: 33.2 g/dL (ref 30.0–36.0)
MCV: 89.6 fL (ref 80.0–100.0)
Monocytes Absolute: 0.5 10*3/uL (ref 0.1–1.0)
Monocytes Relative: 5 %
Neutro Abs: 6.6 10*3/uL (ref 1.7–7.7)
Neutrophils Relative %: 57 %
Platelet Count: 346 10*3/uL (ref 150–400)
RBC: 4.44 MIL/uL (ref 3.87–5.11)
RDW: 13.8 % (ref 11.5–15.5)
WBC Count: 11.5 10*3/uL — ABNORMAL HIGH (ref 4.0–10.5)
nRBC: 0 % (ref 0.0–0.2)

## 2021-01-10 LAB — CMP (CANCER CENTER ONLY)
ALT: 12 U/L (ref 0–44)
AST: 11 U/L — ABNORMAL LOW (ref 15–41)
Albumin: 4.2 g/dL (ref 3.5–5.0)
Alkaline Phosphatase: 112 U/L (ref 38–126)
Anion gap: 7 (ref 5–15)
BUN: 18 mg/dL (ref 6–20)
CO2: 31 mmol/L (ref 22–32)
Calcium: 9.3 mg/dL (ref 8.9–10.3)
Chloride: 105 mmol/L (ref 98–111)
Creatinine: 0.89 mg/dL (ref 0.44–1.00)
GFR, Estimated: >60 mL/min (ref >60)
Glucose, Bld: 109 mg/dL — ABNORMAL HIGH (ref 70–99)
Potassium: 3.9 mmol/L (ref 3.5–5.1)
Sodium: 143 mmol/L (ref 135–145)
Total Bilirubin: 0.3 mg/dL (ref 0.3–1.2)
Total Protein: 6.6 g/dL (ref 6.5–8.1)

## 2021-01-10 LAB — SAVE SMEAR(SSMR), FOR PROVIDER SLIDE REVIEW

## 2021-01-10 LAB — PROTIME-INR
INR: 0.9 (ref 0.8–1.2)
Prothrombin Time: 12.4 seconds (ref 11.4–15.2)

## 2021-01-10 LAB — APTT: aPTT: 33 seconds (ref 24–36)

## 2021-01-10 NOTE — Progress Notes (Signed)
Hematology/Oncology Consultation   Name: Cassidy Tran      MRN: [id number]    Location: Room/bed info not found  Date: 01/10/2021 Time:2:49 PM   REFERRING PHYSICIAN:  Gerrit Heck, DO  REASON FOR CONSULT:  Von Willebrand disease type IA   DIAGNOSIS: Von Willebrand disease type IA  HISTORY OF PRESENT ILLNESS: Cassidy Tran is a very pleasant 45 yo caucasian female with long history of Von Willebrand's disease type A1. She states that she was originally diagnosed in the 1990's after having significant bleeding after sinus surgery.  She is originally a patient of Cassidy Tran.  No known family history of VWB disease.  She states that for her hysterectomy in 2014 she did the DDAVP nasal spray. When she had surgery on the brain for Chiari malformation with shunt placement  in 2018 she did infusions for 1 week.  She states that she had endometriosis prior to her hysterectomy and very heavy cycles. No history of pregnancy or miscarriage.  She has the occasional bloody nose with dry weather but this stops quickly without intervention.  No current issues with blood loss. No abnormal bruising, no petechiae.  She has been having right upper quadrant pain and nausea and is scheduled to have an EGD and colonoscopy on 02/04/2021.   Her HIDA scan showed gallbladder EF to be only 13%. She states they feel this is likely the cause of her abdominal pain and nausea.  No history of diabetes or thyroid disease. She has history of skin cancer. She cannot remember the type.  Her sister had non-Hodgkin's lymphoma and father had prostate cancer.   She has SOB with exacerbation of COPD and asthma.  No fever, chills, vomiting, cough, rash, dizziness, chest pain, palpitations, abdominal pain or changes in bowel or bladder habits. No swelling, numbness or tingling in her extremities at this time.  No falls or syncope to report.  She smokes 1/2 ppd, no recreational drug use. Rare ETOH socially.  Her appetite is  ok but has been effected by the nausea and abdominal pain. She is staying well hydrated. Her weight is stable at 213 lbs.   ROS: All other 10 point review of systems is negative.   PAST MEDICAL HISTORY:   Past Medical History:  Diagnosis Date   Allergy    Anxiety    Arthritis    lower back   ASCUS (atypical squamous cells of undetermined significance) on Pap smear 03/2011   NEG HR HPV   Asthma    Cancer ([place])    skin cancer- age 18ish   Cervical dysplasia, mild 08/2010   LGSIL colposcopy biopsy showing koilocytotic atypia   Clotting disorder ([place])    Depression    Endometriosis    Headache(784.0)    High risk HPV infection 12/2011   Pap normal   Pneumonia    2016ish   Seizures ([facility])    seizure disorder   Smoker    Von Willebrand disease     ALLERGIES: Allergies  Allergen Reactions   Aspirin Other (See Comments)    Childhood reaction   Codeine Nausea And Vomiting    Tolerates Hydrocodone   Lactose Diarrhea   Lactose Intolerance (Gi) Diarrhea   Nsaids Nausea And Vomiting   Penicillins Other (See Comments)    Has patient had a PCN reaction causing immediate rash, facial/tongue/throat swelling, SOB or lightheadedness with hypotension: doesn't remember childhood Reaction  Has patient had a PCN reaction causing severe rash involving mucus membranes or skin necrosis: NO  Has patient had a PCN reaction that required hospitalization NO Has patient had a PCN reaction occurring within the last 10 years: NO If all of the above answers are "NO", then may proceed with Cephalosporin use.    Erythromycin Other (See Comments)    Childhood reaction, tolerate Zpak Childhood reaction does not know.      MEDICATIONS:  Current Outpatient Medications on File Prior to Visit  Medication Sig Dispense Refill   acetaminophen (TYLENOL) 500 MG tablet Take 1,000 mg by mouth every 6 (six) hours as needed for mild pain or moderate pain. pain      ADVAIR HFA 230-21 MCG/ACT inhaler INHALE 2  PUFFS BY MOUTH INTO THE LUNGS TWICE DAILY 12 g 0   albuterol (PROVENTIL) (2.5 MG/3ML) 0.083% nebulizer solution Take 3 mLs (2.5 mg total) by nebulization every 6 (six) hours as needed for wheezing or shortness of breath. 75 mL 1   divalproex (DEPAKOTE ER) 500 MG 24 hr tablet Take 1,000 mg by mouth 2 (two) times daily.     estradiol (VIVELLE-DOT) 0.05 MG/24HR patch APPLY 1 PATCH(0.05 MG) EXTERNALLY TO THE SKIN 2 TIMES A WEEK 24 patch 4   gabapentin (NEURONTIN) 300 MG capsule TK 2 CS PO HS FOR POSTERIOR HEAD PAINS     meclizine (ANTIVERT) 25 MG tablet TAKE 1 TABLET BY MOUTH THREE TIMES DAILY AS NEEDED FOR DIZZINESS 20 tablet 0   mometasone (NASONEX) 50 MCG/ACT nasal spray Use one to two sprays in each nostril once daily 17 g 5   montelukast (SINGULAIR) 10 MG tablet TAKE 1 TABLET BY MOUTH AT BEDTIME 30 tablet 0   PROAIR HFA 108 (90 Base) MCG/ACT inhaler INHALE 2 PUFFS INTO THE LUNGS EVERY 4 HOURS AS NEEDED FOR WHEEZING OR SHORTNESS OF BREATH 8.5 g 0   Sod Picosulfate-Mag Ox-Cit Acd (CLENPIQ) 10-3.5-12 MG-GM -GM/160ML SOLN Take 1 kit by mouth as directed. 320 mL 0   zonisamide (ZONEGRAN) 100 MG capsule Take 100 mg by mouth 2 (two) times daily.      Current Facility-Administered Medications on File Prior to Visit  Medication Dose Route Frequency Provider Last Rate Last Admin   desmopressin (DDAVP) injection 34.04 mcg  0.3 mcg/kg (Order-Specific) Intravenous Once Fontaine, Belinda Block, MD         PAST SURGICAL HISTORY Past Surgical History:  Procedure Laterality Date   APPLICATION OF CRANIAL NAVIGATION N/A 11/24/2016   Procedure: APPLICATION OF CRANIAL NAVIGATION;  Surgeon: Cassidy Miss, MD;  Location: [facility];  Service: Neurosurgery;  Laterality: N/A;   BREAST BIOPSY Left    COLPOSCOPY     CYSTOSCOPY N/A 10/14/2012   Procedure: CYSTOSCOPY;  Surgeon: Cassidy Auerbach, MD;  Location: [facility];  Service: Gynecology;  Laterality: N/A;   DILATION AND CURETTAGE OF UTERUS     HYSTEROSCOPY WITH D & C   10/14/2010   Procedure: DILATATION AND CURETTAGE (D&C) /HYSTEROSCOPY;  Surgeon: Cassidy Auerbach, MD;  Location[facility];  Service: Gynecology;  Laterality: N/A;   LAPAROSCOPIC HYSTERECTOMY N/A 10/14/2012   Procedure: HYSTERECTOMY TOTAL LAPAROSCOPIC;  Surgeon: Cassidy Auerbach, MD;  Location: [facility];  Service: Gynecology;  Laterality: N/A;  CPT [id number]  2 1/2 hours  Dr. Uvaldo Rising to assist.   LAPAROSCOPY  04/27/2011   Procedure: LAPAROSCOPY OPERATIVE;  Surgeon: Cassidy Auerbach, MD;  Location: [facility];  Service: Gynecology;  Laterality: N/A;  removal right cyst , lysis of adhesions, biopsy of peritoneum   NASAL SINUS SURGERY  1993   IN [place],  [place]   SUBOCCIPITAL CRANIECTOMY CERVICAL LAMINECTOMY N/A 11/06/2016   Procedure: Suboccipital decompression for chiari malformation;  Surgeon: Cassidy Miss, MD;  Location: [facility];  Service: Neurosurgery;  Laterality: N/A;   TONSILLECTOMY     VENTRICULOPERITONEAL SHUNT N/A 11/24/2016   Procedure: SHUNT INSERTION VENTRICULAR-PERITONEAL with Brainlab;  Surgeon: Cassidy Miss, MD;  Location: [facility];  Service: Neurosurgery;  Laterality: N/A;  right side approach   WISDOM TOOTH EXTRACTION  1993    FAMILY HISTORY: Family History  Problem Relation Age of Onset   Hypertension Mother    Heart disease Mother    Hyperlipidemia Mother    Diabetes Father    Hypertension Father    Heart disease Father    Hyperlipidemia Father    Stroke Father    Prostate cancer Father    Lymphoma Sister        NON-HODGKINS LYMPHOMA   Cancer Maternal Uncle        Lung and pancreatic cancer   Colon cancer Neg Hx    Rectal cancer Neg Hx    Esophageal cancer Neg Hx     SOCIAL HISTORY:  reports that she has been smoking cigarettes. She has a 10.00 pack-year smoking history. She has never used smokeless tobacco. She reports that she does not currently use alcohol. She reports that she does not use drugs.  PERFORMANCE STATUS: The patient's performance status is 1 -  Symptomatic but completely ambulatory  PHYSICAL EXAM: Most Recent Vital Signs: Last menstrual period 09/16/2012. BP (!) 157/80 (BP Location: Left Arm, Patient Position: Sitting)   Pulse 89   Temp 99 F (37.2 C) (Oral)   Resp 18   Ht 5' 3.5" (1.613 m)   Wt 213 lb 6.4 oz (96.8 kg)   LMP 09/16/2012   SpO2 100%   BMI 37.21 kg/m   General Appearance:    Alert, cooperative, no distress, appears stated age  Head:    Normocephalic, without obvious abnormality, atraumatic  Eyes:    PERRL, conjunctiva/corneas clear, EOM's intact, fundi    benign, both eyes        Throat:   Lips, mucosa, and tongue normal; teeth and gums normal  Neck:   Supple, symmetrical, trachea midline, no adenopathy;    thyroid:  no enlargement/tenderness/nodules; no carotid   bruit or JVD  Back:     Symmetric, no curvature, ROM normal, no CVA tenderness  Lungs:     Clear to auscultation bilaterally, respirations unlabored  Chest Wall:    No tenderness or deformity   Heart:    Regular rate and rhythm, S1 and S2 normal, no murmur, rub   or gallop     Abdomen:     Soft, non-tender, bowel sounds active all four quadrants,    no masses, no organomegaly        Extremities:   Extremities normal, atraumatic, no cyanosis or edema  Pulses:   2+ and symmetric all extremities  Skin:   Skin color, texture, turgor normal, no rashes or lesions  Lymph nodes:   Cervical, supraclavicular, and axillary nodes normal  Neurologic:   CNII-XII intact, normal strength, sensation and reflexes    throughout    LABORATORY DATA:  No results found for this or any previous visit (from the past 48 hour(s)).    RADIOGRAPHY: No results found.     PATHOLOGY: None  ASSESSMENT/PLAN: Ms. Daughety is a very pleasant 45 yo caucasian female with long history of Von Willebrand's disease type A1.  She is scheduled  to have an EGD and colonoscopy with Dr. Bryan Lemma on 12/9.  Of note, she may also need to eventually have her gallbladder removed if  this is in fact the cause of her symptoms.  Lab work reviewed with Dr. Marin Olp. Per MD she will use Desmopressin Acetate nasal pray 10  mcg nasal once the morning of her procedure and once again the morning after her procedure.  We will continue to follow-up as needed.   All questions were answered. The patient knows to call the clinic with any problems, questions or concerns. We can certainly see the patient much sooner if necessary.  The patient was discussed with Dr. Marin Olp and he is in agreement with the aforementioned.   Lottie Dawson, NP

## 2021-01-11 ENCOUNTER — Telehealth: Payer: Self-pay | Admitting: *Deleted

## 2021-01-11 LAB — CALPROTECTIN, FECAL: Calprotectin, Fecal: 21 ug/g (ref 0–120)

## 2021-01-11 LAB — VON WILLEBRAND PANEL
Coagulation Factor VIII: 64 % (ref 56–140)
Ristocetin Co-factor, Plasma: 41 % — ABNORMAL LOW (ref 50–200)
Von Willebrand Antigen, Plasma: 63 % (ref 50–200)

## 2021-01-11 LAB — COAG STUDIES INTERP REPORT

## 2021-01-11 NOTE — Telephone Encounter (Signed)
No 01/10/21 LOS

## 2021-01-14 ENCOUNTER — Telehealth: Payer: Self-pay | Admitting: General Surgery

## 2021-01-14 ENCOUNTER — Telehealth: Payer: Self-pay | Admitting: Family

## 2021-01-14 DIAGNOSIS — A0472 Enterocolitis due to Clostridium difficile, not specified as recurrent: Secondary | ICD-10-CM

## 2021-01-14 LAB — GI PROFILE, STOOL, PCR

## 2021-01-14 MED ORDER — VANCOMYCIN HCL 125 MG PO CAPS: 125.0000 mg | ORAL_CAPSULE | Freq: Four times a day (QID) | ORAL | 0 refills | Status: AC

## 2021-01-14 MED ORDER — DESMOPRESSIN ACETATE SPRAY 0.01 % NA SOLN: 10.0000 ug | Freq: Every day | NASAL | 0 refills | Status: DC

## 2021-01-14 NOTE — Addendum Note (Signed)
Addended by: Lanny Hurst A on: 01/14/2021 11:28 AM   Modules accepted: Orders

## 2021-01-14 NOTE — Telephone Encounter (Signed)
I called to go over DDAVP nasal spray instructions for upcoming endoscopy and colonoscopy. No answer and mail box was full. Will try again Monday.

## 2021-01-14 NOTE — Telephone Encounter (Signed)
Chart reviewed [facility] patient who is away today  C. difficile positive by pathogen panel  Please begin oral vancomycin 125 mg p.o. 4 times daily x10 days Florastor 250 mg twice daily x1 month  May need to reschedule scheduled colonoscopy though will defer this to Dr. Bryan Lemma as she should be finished with C. difficile therapy prior to scheduled endoscopic procedures  In the future please send results by telephone note or encounter rather than staff message so that the exchange can be documented in her chart  Lajuan Lines. Issa Luster, M.D.  01/14/2021

## 2021-01-14 NOTE — Telephone Encounter (Signed)
No voicemail set up will try to call spouse

## 2021-01-14 NOTE — Telephone Encounter (Signed)
-----   Message from Letta Pate, [place] sent at 01/14/2021  9:51 AM EST ----- Regarding: c. Diff I have received a fax from [facility] this am indicating this patient has c.diff and Dr Bryan Lemma is on vacation. The results are in her chart as well. Please advise me on what you would like prescribed?  Thank you  Olivia Mackie

## 2021-01-14 NOTE — Telephone Encounter (Signed)
Pts spouse, [name] no answer, voicemail is full.

## 2021-01-14 NOTE — Telephone Encounter (Signed)
Tried to contact the patient on her Mobile phone, went to voicemail but was not set up. Mychart not set up. Will try to call again. Rx for vancomycin sent to pharmacy.

## 2021-01-17 NOTE — Telephone Encounter (Signed)
Spoke with the patient today and discusssed her c diff dx. The patient verbalized understanding and will take infectious precautions with her family and coworkers. The patient also understood that she should start on a probiotic as well, gave her the name of Florastor. We will check with Dr Bryan Lemma if she should keep her appointment for her colonoscopy on 02/04/2021. Will call her back and advise her of this decision.

## 2021-01-18 LAB — PANCREATIC ELASTASE, FECAL: Pancreatic Elastase-1, Stool: >500 mcg/g

## 2021-01-19 ENCOUNTER — Telehealth: Payer: Self-pay | Admitting: Family

## 2021-01-19 NOTE — Telephone Encounter (Signed)
I was able to speak with Cassidy Tran regarding her prescription and instructions for DDAVP nasal spray prior to procedure next week. She picked up from her pharmacy yesterday and verbalized understanding of instructions. No questions or concerns at this time. Patient appreciative of call.

## 2021-02-04 ENCOUNTER — Encounter: Payer: Self-pay | Admitting: Gastroenterology

## 2021-02-04 ENCOUNTER — Encounter: Payer: No Typology Code available for payment source | Admitting: Gastroenterology

## 2021-02-16 ENCOUNTER — Telehealth: Payer: Self-pay

## 2021-02-16 NOTE — Telephone Encounter (Signed)
Noted on [facility] chart ?

## 2021-02-16 NOTE — Telephone Encounter (Signed)
John, Please review patient's chart and advise if patient is appropriate for [facility] EGD/colon? Thank you

## 2021-02-23 ENCOUNTER — Ambulatory Visit: Payer: No Typology Code available for payment source

## 2021-02-23 VITALS — Ht 63.5 in | Wt 213.0 lb

## 2021-02-23 DIAGNOSIS — R1013 Epigastric pain: Secondary | ICD-10-CM

## 2021-02-23 DIAGNOSIS — R197 Diarrhea, unspecified: Secondary | ICD-10-CM

## 2021-02-23 DIAGNOSIS — R1011 Right upper quadrant pain: Secondary | ICD-10-CM

## 2021-02-23 DIAGNOSIS — K921 Melena: Secondary | ICD-10-CM

## 2021-02-23 NOTE — Progress Notes (Signed)
See 01/19/21 oncology telephone note in regards to DDAVP instructions.

## 2021-02-23 NOTE — Progress Notes (Addendum)
No egg or soy allergy known to patient  No issues with past sedation with any surgeries or procedures Patient denies ever being told they had issues or difficulty with intubation  No FH of Malignant Hyperthermia No diet pills per patient No home 02 use per patient  No blood thinners per patient  Pt denies issues with constipation  No A fib or A flutter  EMMI video to pt or via MyChart  COVID 19 guidelines implemented in [facility] today with Pt and CMA.  Pt is fully vaccinated  for Covid.      Due to the COVID-19 pandemic we are asking patients to follow certain guidelines.  Pt aware of COVID protocols and [place] guidelines   Patient indicates that she already has Clenpiq solution available.  Patient indicates that her last seizure was at least 10 years ago.   Patient indicates that she has picked up DDAVP from the pharmacy in preparation for procedure but is somewhat confused on how to take it. I have reached out to Lottie Dawson, NP with oncology (prescribing provider) to get clarification on this.  ___________________________________________  Per Dr Gerrit Heck 4:45 pm 02/23/21:  "I am ok with proceeding with procedures in [place]. I will typically tell them that if we find large polyps, then aggressive polypectomy needs to be done at [facility], but otherwise smaller polypectomy and limited biopsies are ok, especially since she will be treated with DDAVP"

## 2021-02-23 NOTE — Telephone Encounter (Signed)
I have spoken to patient to advise per Dr Marin Olp and Nurse Practitioner Eulas Post, she may take the DDAVP at 9 am morning of procedure and morning after procedure. Also advised that Dr Marin Olp does not feel she needs any labwork unless she starts having issues with bleeding. Patient verbalizes understanding of this information.

## 2021-02-23 NOTE — Telephone Encounter (Signed)
Hello [name]-  I spoke to this patient today in regards to her colonoscopy that is currently scheduled with [place] on 03/11/21. [name] states that she has picked up the DDAVP that was sent in for her, however the instructions were somewhat vague. She is a bit confused. I see she is to take 1 spray of the medication in the morning and another the morning after the procedure. Is there a specific time that she needs to take it or does that matter as long as she takes it in the morning (her colonoscopy procedure is at 1030 am)? In addition, does she need any labwork to following DDAVP administration?   Thank you so much for any insight you can give!

## 2021-03-07 ENCOUNTER — Encounter: Payer: Self-pay | Admitting: Gastroenterology

## 2021-03-11 ENCOUNTER — Encounter: Payer: Self-pay | Admitting: Gastroenterology

## 2021-03-11 ENCOUNTER — Ambulatory Visit (AMBULATORY_SURGERY_CENTER): Payer: No Typology Code available for payment source | Admitting: Gastroenterology

## 2021-03-11 ENCOUNTER — Other Ambulatory Visit: Payer: Self-pay

## 2021-03-11 VITALS — BP 116/59 | HR 79 | Temp 97.3°F | Resp 16 | Ht 63.0 in | Wt 213.0 lb

## 2021-03-11 DIAGNOSIS — D3A8 Other benign neuroendocrine tumors: Secondary | ICD-10-CM | POA: Diagnosis not present

## 2021-03-11 DIAGNOSIS — D128 Benign neoplasm of rectum: Secondary | ICD-10-CM

## 2021-03-11 DIAGNOSIS — K640 First degree hemorrhoids: Secondary | ICD-10-CM | POA: Diagnosis not present

## 2021-03-11 DIAGNOSIS — R1013 Epigastric pain: Secondary | ICD-10-CM

## 2021-03-11 DIAGNOSIS — K644 Residual hemorrhoidal skin tags: Secondary | ICD-10-CM | POA: Diagnosis not present

## 2021-03-11 DIAGNOSIS — K2100 Gastro-esophageal reflux disease with esophagitis, without bleeding: Secondary | ICD-10-CM | POA: Diagnosis not present

## 2021-03-11 DIAGNOSIS — K31819 Angiodysplasia of stomach and duodenum without bleeding: Secondary | ICD-10-CM

## 2021-03-11 DIAGNOSIS — R197 Diarrhea, unspecified: Secondary | ICD-10-CM

## 2021-03-11 DIAGNOSIS — K921 Melena: Secondary | ICD-10-CM | POA: Diagnosis not present

## 2021-03-11 DIAGNOSIS — R194 Change in bowel habit: Secondary | ICD-10-CM

## 2021-03-11 DIAGNOSIS — K2970 Gastritis, unspecified, without bleeding: Secondary | ICD-10-CM | POA: Diagnosis not present

## 2021-03-11 DIAGNOSIS — K5520 Angiodysplasia of colon without hemorrhage: Secondary | ICD-10-CM

## 2021-03-11 DIAGNOSIS — K6389 Other specified diseases of intestine: Secondary | ICD-10-CM

## 2021-03-11 DIAGNOSIS — K31811 Angiodysplasia of stomach and duodenum with bleeding: Secondary | ICD-10-CM

## 2021-03-11 DIAGNOSIS — R1011 Right upper quadrant pain: Secondary | ICD-10-CM

## 2021-03-11 MED ORDER — PANTOPRAZOLE SODIUM 40 MG PO TBEC: DELAYED_RELEASE_TABLET | ORAL | 1 refills | Status: DC

## 2021-03-11 MED ORDER — PANTOPRAZOLE SODIUM 40 MG PO TBEC: 40.0000 mg | DELAYED_RELEASE_TABLET | Freq: Two times a day (BID) | ORAL | 1 refills | Status: DC

## 2021-03-11 MED ORDER — SODIUM CHLORIDE 0.9 % IV SOLN: 500.0000 mL | Freq: Once | INTRAVENOUS | Status: DC

## 2021-03-11 NOTE — Progress Notes (Signed)
No problems noted in the recovery room. maw 

## 2021-03-11 NOTE — Op Note (Signed)
[facility] Patient Name: Cassidy Tran Procedure Date: 03/11/2021 10:25 AM MRN: [id number] Endoscopist: Gerrit Heck , MD Age: 46 Referring MD:  Date of Birth: Oct 27, 1975 Gender: Female Account #: [id number] Procedure:                Upper GI endoscopy Indications:              Epigastric abdominal pain, Abdominal pain in the                            right upper quadrant, Diarrhea Medicines:                Monitored Anesthesia Care Procedure:                Pre-Anesthesia Assessment:                           - Prior to the procedure, a History and Physical                            was performed, and patient medications and                            allergies were reviewed. The patient's tolerance of                            previous anesthesia was also reviewed. The risks                            and benefits of the procedure and the sedation                            options and risks were discussed with the patient.                            All questions were answered, and informed consent                            was obtained. Prior Anticoagulants: The patient has                            taken no previous anticoagulant or antiplatelet                            agents. ASA Grade Assessment: III - A patient with                            severe systemic disease. After reviewing the risks                            and benefits, the patient was deemed in                            satisfactory condition to undergo the procedure.  After obtaining informed consent, the endoscope was                            passed under direct vision. Throughout the                            procedure, the patient's blood pressure, pulse, and                            oxygen saturations were monitored continuously. The                            [place] #[id number] was introduced through                            the mouth, and  advanced to the second part of                            duodenum. The upper GI endoscopy was accomplished                            without difficulty. The patient tolerated the                            procedure well. Scope In: Scope Out: Findings:                 LA Grade A (one or more mucosal breaks less than 5                            mm, not extending between tops of 2 mucosal folds)                            esophagitis with no bleeding was found 39 cm from                            the incisors.                           The upper third of the esophagus and middle third                            of the esophagus were normal.                           A single small angioectasia with bleeding was found                            in the gastric antrum. This was actively oozing.                            For hemostasis, one hemostatic clip was  successfully placed (MR conditional). There was no                            bleeding at the end of the procedure.                           Hematin (altered blood/coffee-ground-like material)                            was found scattered throughout the gastric fundus,                            gastric body, and in the gastric antrum. Lavage of                            the area was performed using tap water, resulting                            in clearance with good visualization. Aside from                            the above AVM, no other bleeding lesions or                            stigmata of recent bleeding noted.                           Segmental mild inflammation characterized by                            erythema was found in the gastric fundus, in the                            gastric body and in the gastric antrum. Biopsies                            were taken with a cold forceps for Helicobacter                            pylori testing. Estimated blood loss was minimal.                            The examined duodenum was normal. Biopsies were                            taken with a cold forceps for histology. Estimated                            blood loss was minimal. Complications:            No immediate complications. Estimated Blood Loss:     Estimated blood loss was minimal. Impression:               - LA Grade A reflux esophagitis with no bleeding.                           -  Normal upper third of esophagus and middle third                            of esophagus.                           - A single bleeding angioectasia in the stomach.                            Clip (MR conditional) was placed.                           - Hematin (altered blood/coffee-ground-like                            material) in the gastric antrum, in the gastric                            fundus and in the gastric body.                           - Mild, non-ulcer gastritis. Biopsied.                           - Normal examined duodenum. Biopsied. Recommendation:           - Patient has a contact number available for                            emergencies. The signs and symptoms of potential                            delayed complications were discussed with the                            patient. Return to normal activities tomorrow.                            Written discharge instructions were provided to the                            patient.                           - Resume previous diet.                           - Continue present medications.                           - Await pathology results.                           - Repeat upper endoscopy PRN. If concern for                            bleeding, recommend performing procedure at  [facility] with APC availability.                           - Perform a colonoscopy today.                           - Use Protonix (pantoprazole) 40 mg PO BID for 6                            weeks to promote  mucosal healing, then reduce to 40                            mg daily, and continue to titrate to lowest                            effective dose or off completely if no reflux                            symptoms.                           - DDAVP dose #2 tomorrow per protocol. Gerrit Heck, MD 03/11/2021 11:11:57 AM

## 2021-03-11 NOTE — Progress Notes (Signed)
To pacu, VSS. Report to [email or web address].tb 

## 2021-03-11 NOTE — Patient Instructions (Addendum)
Handouts were given to you on Esophagitis, Gastritis, GERD, colon polyps and hemorrhoids. If you need to have a MRI for any reason, please show the clip card.  You may need to have a X-ray to make sure the clip is gone, since it is metal.  Look in stool eventaly the clip should fall off, like a scab. Protonix was sent to your pharmacy.  Take 2x per day for 6 weeks to promote mucosal healing, then reduce to 40 mg daily.  Continue to titrate to lowest effective dose or off completely if no reflux symptoms. DDAVP dose #2 tomorrow per protocol. Use fiber, for example Citricel, Fibercon, Konsyl or Metamucil. You may resume your other current medications today. Await biopsy results.  May take 1-3 weeks to receive pathology results. Please call if any questions or concerns.       YOU HAD AN ENDOSCOPIC PROCEDURE TODAY AT [facility]:   Refer to the procedure report that was given to you for any specific questions about what was found during the examination.  If the procedure report does not answer your questions, please call your gastroenterologist to clarify.  If you requested that your care partner not be given the details of your procedure findings, then the procedure report has been included in a sealed envelope for you to review at your convenience later.  YOU SHOULD EXPECT: Some feelings of bloating in the abdomen. Passage of more gas than usual.  Walking can help get rid of the air that was put into your GI tract during the procedure and reduce the bloating. If you had a lower endoscopy (such as a colonoscopy or flexible sigmoidoscopy) you may notice spotting of blood in your stool or on the toilet paper. If you underwent a bowel prep for your procedure, you may not have a normal bowel movement for a few days.  Please Note:  You might notice some irritation and congestion in your nose or some drainage.  This is from the oxygen used during your procedure.  There is no need for concern  and it should clear up in a day or so.  SYMPTOMS TO REPORT IMMEDIATELY:  Following lower endoscopy (colonoscopy or flexible sigmoidoscopy):  Excessive amounts of blood in the stool  Significant tenderness or worsening of abdominal pains  Swelling of the abdomen that is new, acute  Fever of 100F or higher  Following upper endoscopy (EGD)  Vomiting of blood or coffee ground material  New chest pain or pain under the shoulder blades  Painful or persistently difficult swallowing  New shortness of breath  Fever of 100F or higher  Black, tarry-looking stools  For urgent or emergent issues, a gastroenterologist can be reached at any hour by calling [phone]. Do not use MyChart messaging for urgent concerns.    DIET:  We do recommend a small meal at first, but then you may proceed to your regular diet.  Drink plenty of fluids but you should avoid alcoholic beverages for 24 hours.  ACTIVITY:  You should plan to take it easy for the rest of today and you should NOT DRIVE or use heavy machinery until tomorrow (because of the sedation medicines used during the test).    FOLLOW UP: Our staff will call the number listed on your records 48-72 hours following your procedure to check on you and address any questions or concerns that you may have regarding the information given to you following your procedure. If we do not reach you,  we will leave a message.  We will attempt to reach you two times.  During this call, we will ask if you have developed any symptoms of COVID 19. If you develop any symptoms (ie: fever, flu-like symptoms, shortness of breath, cough etc.) before then, please call [phone].  If you test positive for Covid 19 in the 2 weeks post procedure, please call and report this information to [place].    If any biopsies were taken you will be contacted by phone or by letter within the next 1-3 weeks.  Please call us at [phone] if you have not heard about the biopsies in 3  weeks.    SIGNATURES/CONFIDENTIALITY: You and/or your care partner have signed paperwork which will be entered into your electronic medical record.  These signatures attest to the fact that that the information above on your After Visit Summary has been reviewed and is understood.  Full responsibility of the confidentiality of this discharge information lies with you and/or your care-partner.

## 2021-03-11 NOTE — Progress Notes (Signed)
Called to room to assist during endoscopic procedure.  Patient ID and intended procedure confirmed with present staff. Received instructions for my participation in the procedure from the performing physician.  

## 2021-03-11 NOTE — Progress Notes (Signed)
D.T. vital signs. °

## 2021-03-11 NOTE — Progress Notes (Signed)
GASTROENTEROLOGY PROCEDURE H&P NOTE   Primary Care Physician: Janie Morning, DO    Reason for Procedure:  Diarrhea, change in bowel habits, hematochezia, RUQ pain, epigastric pain  Plan:    EGD, colonoscopy  Patient is appropriate for endoscopic procedure(s) in the ambulatory ([facility]) setting.  The nature of the procedure, as well as the risks, benefits, and alternatives were carefully and thoroughly reviewed with the patient. Ample time for discussion and questions allowed. The patient understood, was satisfied, and agreed to proceed.     HPI: Cassidy Tran is a 46 y.o. female who presents for EGD and colonoscopy for evaluation of diarrhea, change in bowel habits, intermittent hematochezia, RUQ pain, epigastric pain.  Does have a history of von Willebrand disease.  Pretreated this morning with DDAVP nasal spray and second dose tomorrow morning per hematology recommendations.   Was diagnosed with C. difficile by GI PCR panel 12/2018, treated with vancomycin and Florastor.  Otherwise normal/negative fecal calprotectin, pancreatic elastase, CMP, CBC, PT/INR.  Past Medical History:  Diagnosis Date   Allergy    Anxiety    Arthritis    lower back   ASCUS (atypical squamous cells of undetermined significance) on Pap smear 03/2011   NEG HR HPV   Asthma    Cancer (HCC)    skin cancer- age 18ish   Cervical dysplasia, mild 08/2010   LGSIL colposcopy biopsy showing koilocytotic atypia   Clotting disorder ([place])    Depression    Endometriosis    Headache(784.0)    High risk HPV infection 12/2011   Pap normal   Pneumonia    2016ish   Seizures (HCC)    seizure disorder   Smoker    Von Willebrand disease     Past Surgical History:  Procedure Laterality Date   APPLICATION OF CRANIAL NAVIGATION N/A 11/24/2016   Procedure: APPLICATION OF CRANIAL NAVIGATION;  Surgeon: Kristeen Miss, MD;  Location: [facility];  Service: Neurosurgery;  Laterality: N/A;   BREAST BIOPSY Left    COLPOSCOPY      CYSTOSCOPY N/A 10/14/2012   Procedure: CYSTOSCOPY;  Surgeon: [name], MD;  Location: [facility];  Service: Gynecology;  Laterality: N/A;   DILATION AND CURETTAGE OF UTERUS     HYSTEROSCOPY WITH D & C  10/14/2010   Procedure: DILATATION AND CURETTAGE (D&C) /HYSTEROSCOPY;  Surgeon: [name], MD;  Location: [facility];  Service: Gynecology;  Laterality: N/A;   LAPAROSCOPIC HYSTERECTOMY N/A 10/14/2012   Procedure: HYSTERECTOMY TOTAL LAPAROSCOPIC;  Surgeon: [name], MD;  Location: [facility];  Service: Gynecology;  Laterality: N/A;  CPT [id number]  2 1/2 hours  Dr. Uvaldo Rising to assist.   LAPAROSCOPY  04/27/2011   Procedure: LAPAROSCOPY OPERATIVE;  Surgeon: [name], MD;  Location: [facility];  Service: Gynecology;  Laterality: N/A;  removal right cyst , lysis of adhesions, biopsy of peritoneum   [place], [place]   SUBOCCIPITAL CRANIECTOMY CERVICAL LAMINECTOMY N/A 11/06/2016   Procedure: Suboccipital decompression for chiari malformation;  Surgeon: Kristeen Miss, MD;  Location: [facility];  Service: Neurosurgery;  Laterality: N/A;   TONSILLECTOMY     VENTRICULOPERITONEAL SHUNT N/A 11/24/2016   Procedure: SHUNT INSERTION VENTRICULAR-PERITONEAL with Brainlab;  Surgeon: Kristeen Miss, MD;  Location: [facility];  Service: Neurosurgery;  Laterality: N/A;  right side approach   [place]    Prior to Admission medications   Medication Sig Start Date End Date Taking? Authorizing Provider  acetaminophen (TYLENOL) 500 MG tablet Take 1,000 mg by mouth every 6 (six) hours as needed for mild pain or moderate pain. pain     [provider]  ADVAIR HFA 230-21 MCG/ACT inhaler INHALE 2 PUFFS BY MOUTH INTO THE LUNGS TWICE DAILY 03/22/18   Kozlow, Donnamarie Poag, MD  albuterol (PROVENTIL) (2.5 MG/3ML) 0.083% nebulizer solution Take 3 mLs (2.5 mg total) by nebulization every 6 (six) hours as needed for wheezing or shortness of breath. 03/26/18   Tysinger,  Camelia Eng, PA-C  amLODipine-valsartan (EXFORGE) 5-160 MG tablet TAKE 1 TABLET BY MOUTH EVERY DAY FOR BLOOD PRESSURE    [provider]  desmopressin (DDAVP NASAL) 0.01 % solution Place 1 spray (10 mcg total) into the nose daily. Administer one spray morning of procedure and again the morning after her procedure. 01/14/21   Celso Amy, NP  divalproex (DEPAKOTE ER) 500 MG 24 hr tablet Take 1,000 mg by mouth 2 (two) times daily.    [provider]  estradiol (VIVELLE-DOT) 0.05 MG/24HR patch APPLY 1 PATCH(0.05 MG) EXTERNALLY TO THE SKIN 2 TIMES A WEEK 01/26/20   Joseph Pierini, MD  meclizine (ANTIVERT) 25 MG tablet TAKE 1 TABLET BY MOUTH THREE TIMES DAILY AS NEEDED FOR DIZZINESS 12/06/17   Tysinger, Camelia Eng, PA-C  Melatonin 10 MG TABS Take 1 tablet by mouth at bedtime as needed.    [provider]  mometasone (NASONEX) 50 MCG/ACT nasal spray Use one to two sprays in each nostril once daily 09/28/15   Kozlow, Donnamarie Poag, MD  montelukast (SINGULAIR) 10 MG tablet TAKE 1 TABLET BY MOUTH AT BEDTIME 03/22/18   Kozlow, Donnamarie Poag, MD  PROAIR HFA 108 [phone] Base) MCG/ACT inhaler INHALE 2 PUFFS INTO THE LUNGS EVERY 4 HOURS AS NEEDED FOR WHEEZING OR SHORTNESS OF BREATH 01/18/18   Kozlow, Donnamarie Poag, MD  rosuvastatin (CRESTOR) 5 MG tablet Take 2.5 mg by mouth 2 (two) times a week. 02/27/21   [provider]  tretinoin (RETIN-A) 0.05 % cream SMARTSIG:Sparingly Topical Every Night 09/02/20   [provider]  zonisamide (ZONEGRAN) 100 MG capsule Take 100 mg by mouth 2 (two) times daily.     [provider]    Current Outpatient Medications  Medication Sig Dispense Refill   acetaminophen (TYLENOL) 500 MG tablet Take 1,000 mg by mouth every 6 (six) hours as needed for mild pain or moderate pain. pain      ADVAIR HFA 230-21 MCG/ACT inhaler INHALE 2 PUFFS BY MOUTH INTO THE LUNGS TWICE DAILY 12 g 0   albuterol (PROVENTIL) (2.5 MG/3ML) 0.083% nebulizer solution Take 3 mLs (2.5 mg  total) by nebulization every 6 (six) hours as needed for wheezing or shortness of breath. 75 mL 1   amLODipine-valsartan (EXFORGE) 5-160 MG tablet TAKE 1 TABLET BY MOUTH EVERY DAY FOR BLOOD PRESSURE     desmopressin (DDAVP NASAL) 0.01 % solution Place 1 spray (10 mcg total) into the nose daily. Administer one spray morning of procedure and again the morning after her procedure. 1 mL 0   divalproex (DEPAKOTE ER) 500 MG 24 hr tablet Take 1,000 mg by mouth 2 (two) times daily.     estradiol (VIVELLE-DOT) 0.05 MG/24HR patch APPLY 1 PATCH(0.05 MG) EXTERNALLY TO THE SKIN 2 TIMES A WEEK 24 patch 4   meclizine (ANTIVERT) 25 MG tablet TAKE 1 TABLET BY MOUTH THREE TIMES DAILY AS NEEDED FOR DIZZINESS 20 tablet 0   Melatonin 10 MG TABS Take 1 tablet by mouth at bedtime  as needed.     mometasone (NASONEX) 50 MCG/ACT nasal spray Use one to two sprays in each nostril once daily 17 g 5   montelukast (SINGULAIR) 10 MG tablet TAKE 1 TABLET BY MOUTH AT BEDTIME 30 tablet 0   PROAIR HFA 108 (90 Base) MCG/ACT inhaler INHALE 2 PUFFS INTO THE LUNGS EVERY 4 HOURS AS NEEDED FOR WHEEZING OR SHORTNESS OF BREATH 8.5 g 0   rosuvastatin (CRESTOR) 5 MG tablet Take 2.5 mg by mouth 2 (two) times a week.     tretinoin (RETIN-A) 0.05 % cream SMARTSIG:Sparingly Topical Every Night     zonisamide (ZONEGRAN) 100 MG capsule Take 100 mg by mouth 2 (two) times daily.      Current Facility-Administered Medications  Medication Dose Route Frequency Provider Last Rate Last Admin   0.9 %  sodium chloride infusion  500 mL Intravenous Once Yakelin Grenier V, DO       Facility-Administered Medications Ordered in Other Visits  Medication Dose Route Frequency Provider Last Rate Last Admin   desmopressin (DDAVP) injection 34.04 mcg  0.3 mcg/kg (Order-Specific) Intravenous Once Fontaine, Belinda Block, MD        Allergies as of 03/11/2021 - Review Complete 03/11/2021  Allergen Reaction Noted   Aspirin Other (See Comments) 09/14/2010   Codeine  Nausea And Vomiting 09/14/2010   Erythromycin Other (See Comments) 09/14/2010   Lactose Diarrhea 07/17/2016   Lactose intolerance (gi) Diarrhea 07/17/2016   Nsaids Nausea And Vomiting 01/10/2021   Penicillins Other (See Comments) 09/14/2010    Family History  Problem Relation Age of Onset   Hypertension Mother    Heart disease Mother    Hyperlipidemia Mother    Diabetes Father    Hypertension Father    Heart disease Father    Hyperlipidemia Father    Stroke Father    Prostate cancer Father    Lymphoma Sister        NON-HODGKINS LYMPHOMA   Cancer Maternal Uncle        Lung and pancreatic cancer   Colon cancer Neg Hx    Rectal cancer Neg Hx    Esophageal cancer Neg Hx     Social History   Socioeconomic History   Marital status: Married    Spouse name: Not on file   Number of children: Not on file   Years of education: Not on file   Highest education level: Not on file  Occupational History   Occupation: Customer Service  Tobacco Use   Smoking status: Every Day    Packs/day: 0.50    Years: 20.00    Pack years: 10.00    Types: Cigarettes   Smokeless tobacco: Never  Vaping Use   Vaping Use: Never used  Substance and Sexual Activity   Alcohol use: Not Currently    Alcohol/week: 0.0 standard drinks    Comment: Rare   Drug use: No   Sexual activity: Yes    Birth control/protection: Surgical    Comment: 1st intercourse 46 yo-More than 5 partners, MARRIED- 85 YRS   Other Topics Concern   Not on file  Social History Narrative   Not on file   Social Determinants of Health   Financial Resource Strain: Not on file  Food Insecurity: Not on file  Transportation Needs: Not on file  Physical Activity: Not on file  Stress: Not on file  Social Connections: Not on file  Intimate Partner Violence: Not on file    Physical Exam: Vital signs in last 24 hours: @  BP (!) 124/59    Pulse 88    Temp (!) 97.3 F (36.3 C)    Ht 5\' 3"  (1.6 m)    Wt 213 lb (96.6 kg)    LMP  09/16/2012    SpO2 97%    BMI 37.73 kg/m  GEN: NAD EYE: Sclerae anicteric ENT: MMM CV: Non-tachycardic Pulm: CTA b/l GI: Soft, NT/ND NEURO:  Alert & Oriented x 3   Gerrit Heck, DO [facility]   03/11/2021 10:14 AM

## 2021-03-11 NOTE — Op Note (Signed)
[facility] Patient Name: Cassidy Tran Procedure Date: 03/11/2021 10:24 AM MRN: [id number] Endoscopist: Gerrit Heck , MD Age: 46 Referring MD:  Date of Birth: [birth year] Gender: Female Account #: [id number] Procedure:                Colonoscopy Indications:              Epigastric abdominal pain, Abdominal pain in the                            right upper quadrant, Hematochezia, Change in bowel                            habits, Diarrhea Medicines:                Monitored Anesthesia Care Procedure:                Pre-Anesthesia Assessment:                           - Prior to the procedure, a History and Physical                            was performed, and patient medications and                            allergies were reviewed. The patient's tolerance of                            previous anesthesia was also reviewed. The risks                            and benefits of the procedure and the sedation                            options and risks were discussed with the patient.                            All questions were answered, and informed consent                            was obtained. Prior Anticoagulants: The patient has                            taken no previous anticoagulant or antiplatelet                            agents. ASA Grade Assessment: III - A patient with                            severe systemic disease. After reviewing the risks                            and benefits, the patient was deemed in  satisfactory condition to undergo the procedure.                           After obtaining informed consent, the colonoscope                            was passed under direct vision. Throughout the                            procedure, the patient's blood pressure, pulse, and                            oxygen saturations were monitored continuously. The                            CF [id number] #[id number] was introduced  through the anus                            and advanced to the the terminal ileum. The                            colonoscopy was performed without difficulty. The                            patient tolerated the procedure well. The quality                            of the bowel preparation was good. The terminal                            ileum, ileocecal valve, appendiceal orifice, and                            rectum were photographed. Scope In: 10:41:15 AM Scope Out: 11:00:00 AM Scope Withdrawal Time: 0 hours 17 minutes 10 seconds  Total Procedure Duration: 0 hours 18 minutes 45 seconds  Findings:                 Skin tags were found on perianal exam.                           A 15 mm polyp was found in the rectum. The polyp                            was pedunculated. The polyp was removed with a hot                            snare. Resection and retrieval were complete.                            Estimated blood loss: none.                           A few small angiodysplastic lesions without  bleeding were found in the ascending colon and in                            the cecum.                           Non-bleeding internal hemorrhoids were found during                            retroflexion. The hemorrhoids were small.                           Normal mucosa was otherwise found in the remainder                            of the colon. Biopsies for histology were taken                            with a cold forceps from the right colon and left                            colon for evaluation of microscopic colitis.                            Estimated blood loss was minimal.                           The terminal ileum contained one semi-pedunculated,                            non-bleeding polypoid lesion, located 3 cm proximal                            to the IC Valve. This was traversed. The polyp was                            15 mm in  diameter. Biopsies were taken with a cold                            forceps for histology. Estimated blood loss was                            minimal. The remainder of the ileum was normal                            appearing to approximately 10 cm from the ICV. Complications:            No immediate complications. Estimated Blood Loss:     Estimated blood loss was minimal. Impression:               - Perianal skin tags found on perianal exam.                           - One 15 mm polyp in  the rectum, removed with a hot                            snare. Resected and retrieved.                           - A few non-bleeding colonic angiodysplastic                            lesions.                           - Non-bleeding internal hemorrhoids.                           - Normal mucosa in the entire examined colon.                            Biopsied.                           - One polypoid lesion in the terminal ileum.                            Biopsied to evaluate for lipoma vs adenoma. Recommendation:           - Patient has a contact number available for                            emergencies. The signs and symptoms of potential                            delayed complications were discussed with the                            patient. Return to normal activities tomorrow.                            Written discharge instructions were provided to the                            patient.                           - Resume previous diet.                           - Continue present medications.                           - Await pathology results.                           - Repeat colonoscopy for surveillance based on                            pathology results. If the ileal lesion is  adenomatous, will plan on repeat colonoscopy at [facility] for Endoscopic Mucosal Resection.                           - Use fiber, for example  Citrucel, Fibercon, Konsyl                            or Metamucil. Gerrit Heck, MD 03/11/2021 11:19:18 AM

## 2021-03-11 NOTE — Progress Notes (Signed)
Established Patient Office Visit  Subjective:  Patient ID: Cassidy Tran, female    DOB: 05/31/1975  Age: 46 y.o. MRN: [id number]  CC:  Chief Complaint  Patient presents with   Colonoscopy    Diarrhea   Endoscopy    Abdominal pain     HPI Cassidy Tran presents for EGD and colonoscopy  Past Medical History:  Diagnosis Date   Allergy    Anxiety    Arthritis    lower back   ASCUS (atypical squamous cells of undetermined significance) on Pap smear 03/2011   NEG HR HPV   Asthma    Cancer ([place])    skin cancer- age 18ish   Cervical dysplasia, mild 08/2010   LGSIL colposcopy biopsy showing koilocytotic atypia   Clotting disorder ([place])    Depression    Endometriosis    Headache(784.0)    High risk HPV infection 12/2011   Pap normal   Pneumonia    2016ish   Seizures ([facility])    seizure disorder   Smoker    Von Willebrand disease     Past Surgical History:  Procedure Laterality Date   APPLICATION OF CRANIAL NAVIGATION N/A 11/24/2016   Procedure: APPLICATION OF CRANIAL NAVIGATION;  Surgeon: Kristeen Miss, MD;  Location: [facility];  Service: Neurosurgery;  Laterality: N/A;   BREAST BIOPSY Left    COLPOSCOPY     CYSTOSCOPY N/A 10/14/2012   Procedure: CYSTOSCOPY;  Surgeon: [name], MD;  Location: [facility];  Service: Gynecology;  Laterality: N/A;   DILATION AND CURETTAGE OF UTERUS     HYSTEROSCOPY WITH D & C  10/14/2010   Procedure: DILATATION AND CURETTAGE (D&C) /HYSTEROSCOPY;  Surgeon: [name], MD;  Location: [facility];  Service: Gynecology;  Laterality: N/A;   LAPAROSCOPIC HYSTERECTOMY N/A 10/14/2012   Procedure: HYSTERECTOMY TOTAL LAPAROSCOPIC;  Surgeon: [name], MD;  Location: [facility];  Service: Gynecology;  Laterality: N/A;  CPT [id number]  2 1/2 hours  Dr. Uvaldo Rising to assist.   LAPAROSCOPY  04/27/2011   Procedure: LAPAROSCOPY OPERATIVE;  Surgeon: [name], MD;  Location: [facility];  Service: Gynecology;  Laterality: N/A;  removal  right cyst , lysis of adhesions, biopsy of peritoneum   [place], [place]   SUBOCCIPITAL CRANIECTOMY CERVICAL LAMINECTOMY N/A 11/06/2016   Procedure: Suboccipital decompression for chiari malformation;  Surgeon: Kristeen Miss, MD;  Location: [facility];  Service: Neurosurgery;  Laterality: N/A;   TONSILLECTOMY     VENTRICULOPERITONEAL SHUNT N/A 11/24/2016   Procedure: SHUNT INSERTION VENTRICULAR-PERITONEAL with Brainlab;  Surgeon: Kristeen Miss, MD;  Location: [facility];  Service: Neurosurgery;  Laterality: N/A;  right side approach   WISDOM TOOTH EXTRACTION  1993    Family History  Problem Relation Age of Onset   Hypertension Mother    Heart disease Mother    Hyperlipidemia Mother    Diabetes Father    Hypertension Father    Heart disease Father    Hyperlipidemia Father    Stroke Father    Prostate cancer Father    Lymphoma Sister        NON-HODGKINS LYMPHOMA   Cancer Maternal Uncle        Lung and pancreatic cancer   Colon cancer Neg Hx    Rectal cancer Neg Hx    Esophageal cancer Neg Hx     Social History   Socioeconomic History   Marital status: Married    Spouse name: Not  on file   Number of children: Not on file   Years of education: Not on file   Highest education level: Not on file  Occupational History   Occupation: Customer Service  Tobacco Use   Smoking status: Every Day    Packs/day: 0.50    Years: 20.00    Pack years: 10.00    Types: Cigarettes   Smokeless tobacco: Never  Vaping Use   Vaping Use: Never used  Substance and Sexual Activity   Alcohol use: Not Currently    Alcohol/week: 0.0 standard drinks    Comment: Rare   Drug use: No   Sexual activity: Yes    Birth control/protection: Surgical    Comment: 1st intercourse 46 yo-More than 5 partners, MARRIED- 83 YRS   Other Topics Concern   Not on file  Social History Narrative   Not on file   Social Determinants of Health   Financial Resource Strain: Not on file  Food  Insecurity: Not on file  Transportation Needs: Not on file  Physical Activity: Not on file  Stress: Not on file  Social Connections: Not on file  Intimate Partner Violence: Not on file    Outpatient Medications Prior to Visit  Medication Sig Dispense Refill   acetaminophen (TYLENOL) 500 MG tablet Take 1,000 mg by mouth every 6 (six) hours as needed for mild pain or moderate pain. pain      ADVAIR HFA 230-21 MCG/ACT inhaler INHALE 2 PUFFS BY MOUTH INTO THE LUNGS TWICE DAILY 12 g 0   amLODipine-valsartan (EXFORGE) 5-160 MG tablet TAKE 1 TABLET BY MOUTH EVERY DAY FOR BLOOD PRESSURE     desmopressin (DDAVP NASAL) 0.01 % solution Place 1 spray (10 mcg total) into the nose daily. Administer one spray morning of procedure and again the morning after her procedure. 1 mL 0   divalproex (DEPAKOTE ER) 500 MG 24 hr tablet Take 1,000 mg by mouth 2 (two) times daily.     estradiol (VIVELLE-DOT) 0.05 MG/24HR patch APPLY 1 PATCH(0.05 MG) EXTERNALLY TO THE SKIN 2 TIMES A WEEK 24 patch 4   Melatonin 10 MG TABS Take 1 tablet by mouth at bedtime as needed.     mometasone (NASONEX) 50 MCG/ACT nasal spray Use one to two sprays in each nostril once daily 17 g 5   montelukast (SINGULAIR) 10 MG tablet TAKE 1 TABLET BY MOUTH AT BEDTIME 30 tablet 0   PROAIR HFA 108 (90 Base) MCG/ACT inhaler INHALE 2 PUFFS INTO THE LUNGS EVERY 4 HOURS AS NEEDED FOR WHEEZING OR SHORTNESS OF BREATH 8.5 g 0   rosuvastatin (CRESTOR) 5 MG tablet Take 2.5 mg by mouth 2 (two) times a week.     zonisamide (ZONEGRAN) 100 MG capsule Take 100 mg by mouth 2 (two) times daily.      Sod Picosulfate-Mag Ox-Cit Acd (CLENPIQ) 10-3.5-12 MG-GM -GM/160ML SOLN Take 1 kit by mouth as directed. 320 mL 0   albuterol (PROVENTIL) (2.5 MG/3ML) 0.083% nebulizer solution Take 3 mLs (2.5 mg total) by nebulization every 6 (six) hours as needed for wheezing or shortness of breath. 75 mL 1   meclizine (ANTIVERT) 25 MG tablet TAKE 1 TABLET BY MOUTH THREE TIMES DAILY AS  NEEDED FOR DIZZINESS 20 tablet 0   tretinoin (RETIN-A) 0.05 % cream SMARTSIG:Sparingly Topical Every Night     Facility-Administered Medications Prior to Visit  Medication Dose Route Frequency Provider Last Rate Last Admin   desmopressin (DDAVP) injection 34.04 mcg  0.3 mcg/kg (Order-Specific) Intravenous Once Fontaine,  Belinda Block, MD        Allergies  Allergen Reactions   Aspirin Other (See Comments)    Childhood reaction   Codeine Nausea And Vomiting    Tolerates Hydrocodone   Erythromycin Other (See Comments)    Childhood reaction, tolerate Zpak Childhood reaction does not know.   Lactose Diarrhea   Lactose Intolerance (Gi) Diarrhea   Nsaids Nausea And Vomiting   Penicillins Other (See Comments)    Has patient had a PCN reaction causing immediate rash, facial/tongue/throat swelling, SOB or lightheadedness with hypotension: doesn't remember childhood Reaction  Has patient had a PCN reaction causing severe rash involving mucus membranes or skin necrosis: NO Has patient had a PCN reaction that required hospitalization NO Has patient had a PCN reaction occurring within the last 10 years: NO If all of the above answers are "NO", then may proceed with Cephalosporin use.     ROS Review of Systems    Objective:    Physical Exam  BP (!) 103/59    Pulse 81    Temp (!) 97.3 F (36.3 C)    Resp 13    Ht 5' 3" (1.6 m)    Wt 213 lb (96.6 kg)    LMP 09/16/2012    SpO2 94%    BMI 37.73 kg/m  Wt Readings from Last 3 Encounters:  03/11/21 213 lb (96.6 kg)  02/23/21 213 lb (96.6 kg)  01/10/21 213 lb 6.4 oz (96.8 kg)     Health Maintenance Due  Topic Date Due   Pneumococcal Vaccine 62-1 Years old (1 - PCV) Never done   Hepatitis C Screening  Never done   TETANUS/TDAP  Never done   PAP SMEAR-Modifier  01/05/2020   COVID-19 Vaccine (5 - Booster for Moderna series) 09/11/2020   INFLUENZA VACCINE  Never done    There are no preventive care reminders to display for this  patient.  Lab Results  Component Value Date   TSH 2.61 12/16/2020   Lab Results  Component Value Date   WBC 11.5 (H) 01/10/2021   HGB 13.2 01/10/2021   HCT 39.8 01/10/2021   MCV 89.6 01/10/2021   PLT 346 01/10/2021   Lab Results  Component Value Date   NA 143 01/10/2021   K 3.9 01/10/2021   CO2 31 01/10/2021   GLUCOSE 109 (H) 01/10/2021   BUN 18 01/10/2021   CREATININE 0.89 01/10/2021   BILITOT 0.3 01/10/2021   ALKPHOS 112 01/10/2021   AST 11 (L) 01/10/2021   ALT 12 01/10/2021   PROT 6.6 01/10/2021   ALBUMIN 4.2 01/10/2021   CALCIUM 9.3 01/10/2021   ANIONGAP 7 01/10/2021   GFR 99.99 12/16/2020   Lab Results  Component Value Date   CHOL 291 (H) 01/26/2020   Lab Results  Component Value Date   HDL 52 01/26/2020   Lab Results  Component Value Date   LDLCALC 201 (H) 01/26/2020   Lab Results  Component Value Date   TRIG 202 (H) 01/26/2020   Lab Results  Component Value Date   CHOLHDL 5.6 (H) 01/26/2020   No results found for: HGBA1C    Assessment & Plan:   Problem List Items Addressed This Visit   None Visit Diagnoses     Diarrhea, unspecified type    -  Primary   Relevant Medications   pantoprazole (PROTONIX) 40 MG tablet   Other Relevant Orders   Diet NPO   Hypoglycemia/Hyperglycemia protocol   Emergency Medications   ECG and pulse  monitoring    Monitor NIBP   Notify physician   Monitor O2 SATs   NPO status   Vital signs   Monitor NIBP, ECG and PSA02   Notify physician   Discharge home with responsible adult.   Discharge instructions   Colonoscopy: verify informed consent   Esophagogastroduodenoscopy: verify informed consent   Esophageal dilation: verify informed consent   Surgical pathology ([facility])   RUQ abdominal pain       Relevant Medications   pantoprazole (PROTONIX) 40 MG tablet   Other Relevant Orders   Esophagogastroduodenoscopy: verify informed consent   Esophageal dilation: verify informed consent   Surgical  pathology ([facility])   Benign neoplasm of rectum       Relevant Medications   pantoprazole (PROTONIX) 40 MG tablet   Other Relevant Orders   Surgical pathology ([facility])   Grade I internal hemorrhoids       Relevant Medications   pantoprazole (PROTONIX) 40 MG tablet   Gastric AVM       Relevant Medications   pantoprazole (PROTONIX) 40 MG tablet   Gastritis and gastroduodenitis       Relevant Medications   pantoprazole (PROTONIX) 40 MG tablet   Gastroesophageal reflux disease with esophagitis without hemorrhage       Relevant Medications   pantoprazole (PROTONIX) 40 MG tablet   Abdominal pain, epigastric       Relevant Medications   pantoprazole (PROTONIX) 40 MG tablet   Hematochezia       Relevant Medications   pantoprazole (PROTONIX) 40 MG tablet   Ileal polyp       Relevant Medications   pantoprazole (PROTONIX) 40 MG tablet       Meds ordered this encounter  Medications   DISCONTD: 0.9 %  sodium chloride infusion   DISCONTD: pantoprazole (PROTONIX) 40 MG tablet    Sig: Take 1 tablet (40 mg total) by mouth 2 (two) times daily. Best to take on an empty stomach 10-20 minutes before food. Take BID for 6 weeks.  Ten reduce to daily and continue to titrate to lowest effective dose or off completely if no reflux symptoms.    Dispense:  60 tablet    Refill:  1   pantoprazole (PROTONIX) 40 MG tablet    Sig: Take twice a day for 6 weeks then reduce to once a day and continue to titrate to lowest effective dose or off completely if no reflux symptoms.  Best taken on empty stomach 10-20 min before food.    Dispense:  60 tablet    Refill:  1    Follow-up: No follow-ups on file.    Trilby Leaver, RN

## 2021-03-11 NOTE — Progress Notes (Signed)
Pt's states no medical or surgical changes since previsit or office visit. 

## 2021-03-15 ENCOUNTER — Encounter: Payer: Self-pay | Admitting: Nurse Practitioner

## 2021-03-15 ENCOUNTER — Other Ambulatory Visit: Payer: Self-pay

## 2021-03-15 ENCOUNTER — Ambulatory Visit (INDEPENDENT_AMBULATORY_CARE_PROVIDER_SITE_OTHER): Payer: No Typology Code available for payment source | Admitting: Nurse Practitioner

## 2021-03-15 ENCOUNTER — Telehealth: Payer: Self-pay | Admitting: *Deleted

## 2021-03-15 VITALS — BP 118/78 | Ht 63.5 in | Wt 210.8 lb

## 2021-03-15 DIAGNOSIS — Z01419 Encounter for gynecological examination (general) (routine) without abnormal findings: Secondary | ICD-10-CM

## 2021-03-15 DIAGNOSIS — Z79890 Hormone replacement therapy (postmenopausal): Secondary | ICD-10-CM | POA: Diagnosis not present

## 2021-03-15 DIAGNOSIS — R102 Pelvic and perineal pain: Secondary | ICD-10-CM | POA: Diagnosis not present

## 2021-03-15 NOTE — Telephone Encounter (Signed)
°  Follow up Call-  Call back number 03/11/2021  Post procedure Call Back phone  # [phone]  Permission to leave phone message Yes  Some recent data might be hidden     Patient questions:  Do you have a fever, pain , or abdominal swelling? No. Pain Score  0 *  Have you tolerated food without any problems? Yes.    Have you been able to return to your normal activities? Yes.    Do you have any questions about your discharge instructions: Diet   No. Medications  No. Follow up visit  No.  Do you have questions or concerns about your Care? No.  Actions: * If pain score is 4 or above: No action needed, pain <4.  Have you developed a fever since your procedure? no  2.   Have you had an respiratory symptoms (SOB or cough) since your procedure? no  3.   Have you tested positive for COVID 19 since your procedure no  4.   Have you had any family members/close contacts diagnosed with the COVID 19 since your procedure?  no   If yes to any of these questions please route to Joylene John, RN and Joella Prince, RN

## 2021-03-15 NOTE — Progress Notes (Signed)
Cassidy Tran 29-Jun-1975 [id number]   History:  46 y.o. G0 presents for annual exam. S/P 2014 [place] for endometriosis/adenomyosis. On ERT since 2020. Has occasional hot flashes but they are not bothersome. Smoker. 2012 LGSIL, 2013 ASCUS positive high-risk HPV, normal since.  History of seizures, von willebrand disease, skin cancer in her 75s. She does complain of bilateral intermittent pelvic pain that is consistent with endometriosis pain. Takes Tylenol as needed for pain.   Gynecologic History Patient's last menstrual period was 09/16/2012.   Contraception/Family planning: status post hysterectomy Sexually active: Yes  Health Maintenance Last Pap: 01/26/2020. Results were: Normal Last mammogram: 09/04/2016. Results were: Normal Last colonoscopy: 03/11/2021. Results were: Multiple polyps, one measuring 15 mm. Waiting on pathology. Last Dexa: Not indicated  Past medical history, past surgical history, family history and social history were all reviewed and documented in the EPIC chart. Married. Works for [employer].  ROS:  A ROS was performed and pertinent positives and negatives are included.  Exam:  Vitals:   03/15/21 1548  BP: 118/78  Weight: 210 lb 12.8 oz (95.6 kg)  Height: 5' 3.5" (1.613 m)   Body mass index is 36.76 kg/m.  General appearance:  Normal Thyroid:  Symmetrical, normal in size, without palpable masses or nodularity. Respiratory  Auscultation:  Clear without wheezing or rhonchi Cardiovascular  Auscultation:  Regular rate, without rubs, murmurs or gallops  Edema/varicosities:  Not grossly evident Abdominal  Soft,nontender, without masses, guarding or rebound.  Liver/spleen:  No organomegaly noted  Hernia:  None appreciated  Skin  Inspection:  Grossly normal Breasts: Examined lying and sitting.   Right: Without masses, retractions, nipple discharge or axillary adenopathy.   Left: Without masses, retractions, nipple discharge or  axillary adenopathy. Genitourinary   Inguinal/mons:  Normal without inguinal adenopathy  External genitalia:  Normal appearing vulva with no masses, tenderness, or lesions  BUS/Urethra/Skene's glands:  Normal  Vagina:  Normal appearing with normal color and discharge, no lesions  Cervix:  Absent  Uterus:  Absent  Adnexa/parametria:     Rt: Normal in size, without masses or tenderness.   Lt: Normal in size, without masses or tenderness.  Anus and perineum: Normal  Digital rectal exam: Normal sphincter tone without palpated masses or tenderness  Patient informed chaperone available to be present for breast and pelvic exam. Patient has requested no chaperone to be present. Patient has been advised what will be completed during breast and pelvic exam.   Assessment/Plan:  46 y.o. G0 for annual exam.   Well female exam with routine gynecological exam - Education provided on SBEs, importance of preventative screenings, current guidelines, high calcium diet, regular exercise, and multivitamin daily.  Labs with PCP.   Hormone replacement therapy (HRT) - Using Vivelle patch twice weekly since 2020. She has occasional hot flashes that are not too bothersome. Sometimes she forgets to switch out patch and does not notice a difference. She is a smoker. Recommend stopping patch and she is agreeable.   Pelvic pain - Bilateral intermittent pelvic pain that is not new for her. She feels pain is consistent with endometriosis pain that she had in the past. S/P [facility] for endometriosis. She is managing with Tylenol. She cannot take Ibuprofen.  We discussed likelihood of worsening endometriosis, although this typically improves with menopause. There is a chance ERT without progesterone can worsen this as well. We discussed option for ultrasound. She plans to monitor pain and will let us know if she decides to proceed with  [place].   Screening for cervical cancer - 2012 LGSIL, 2013 ASCUS positive high-risk HPV, normal  since. No longer screening per guidelines.   Screening for breast cancer - Normal mammogram history. Overdue and encouraged to schedule soon. Normal breast exam today.  Screening for colon cancer - 02/2021 colonoscopy that found multiple polyps, one measuring 15 mm. Waiting on pathology.  Return in 1 year for annual.      Tamela Gammon DNP, 4:24 PM 03/15/2021

## 2021-03-22 ENCOUNTER — Telehealth: Payer: Self-pay

## 2021-03-22 DIAGNOSIS — D3A8 Other benign neuroendocrine tumors: Secondary | ICD-10-CM

## 2021-03-22 NOTE — Telephone Encounter (Signed)
Pt returned call. We have reviewed her pathology results in detail and also recommendations. She is aware that I will send referral to [facility] today and they will contact her directly within the next couple of weeks to set up her appt. She already has the # to [facility] in case she does not hear from them. She is aware that I have placed the order for the CT and radiology schedulers will contact her directly to set up her appt. Pt knows that she will need a recall colonoscopy in 3 years. She is aware that I will send her a letter with the results and information.  Pt had no questions at this time. She verbalized understanding of all information and had no concerns.  - Urgent referral, records, demographic and insurance information sent to [facility]. - CT order in epic. Secure staff message sent to radiology scheduling to contact pt to set up appt.  - 3 yr recall colonoscopy in epic - Letter mailed with results

## 2021-03-22 NOTE — Telephone Encounter (Signed)
Called patient today to discuss results from recent upper endoscopy/colonoscopy.  No answer.  Left voicemail with callback.  Please call her to convey results as below:   - Biopsies from the duodenum and stomach were normal.  No evidence of Celiac Disease, H. pylori infection, or inflammatory changes. - Biopsies from the right and left colon were also normal without any evidence of Microscopic Colitis, IBD - The polyp removed from the rectum was a tubulovillous adenoma.  The margins were negative for any dysplasia, indicating complete resection.  Tentative plan for repeat colonoscopy in 3 years for ongoing polyp surveillance pending surgical evaluation below. - The polypoid lesion that was biopsied in the ileum returned as a well-differentiated neuroendocrine tumor (carcinoid tumor).  As these are surface mucosal biopsies, cannot assess depth of invasion.  I discussed with one of my advanced GI colleagues and we both agree that this should be removed surgically instead of endoscopically.    Please plan for the following: - Referral to Colorectal Surgery to evaluate for ileocecectomy/resection - CT chest/abdomen/pelvis as part of a preoperative work-up - 3-year recall for ongoing polyp surveillance

## 2021-03-29 ENCOUNTER — Encounter (HOSPITAL_BASED_OUTPATIENT_CLINIC_OR_DEPARTMENT_OTHER): Payer: Self-pay

## 2021-03-29 ENCOUNTER — Ambulatory Visit (HOSPITAL_BASED_OUTPATIENT_CLINIC_OR_DEPARTMENT_OTHER)
Admission: RE | Admit: 2021-03-29 | Discharge: 2021-03-29 | Disposition: A | Discharge status: Home / Self Care | Payer: No Typology Code available for payment source | Source: Ambulatory Visit | Attending: Gastroenterology | Admitting: Gastroenterology

## 2021-03-29 ENCOUNTER — Other Ambulatory Visit: Payer: Self-pay

## 2021-03-29 DIAGNOSIS — D3A8 Other benign neuroendocrine tumors: Secondary | ICD-10-CM | POA: Diagnosis present

## 2021-03-29 HISTORY — DX: Essential (primary) hypertension: I10

## 2021-03-29 LAB — POCT I-STAT CREATININE: Creatinine, Ser: 0.7 mg/dL (ref 0.44–1.00)

## 2021-03-29 MED ORDER — IOHEXOL 300 MG/ML  SOLN
100.0000 mL | Freq: Once | INTRAMUSCULAR | Status: AC | PRN
  Administered 2021-03-29: 100 mL via INTRAVENOUS

## 2021-04-29 ENCOUNTER — Other Ambulatory Visit: Payer: Self-pay | Admitting: Family

## 2021-04-29 NOTE — Progress Notes (Signed)
Surgical Instructions ? ? ? Your procedure is scheduled on Thursday March 9th. ? Report to [facility] Main Entrance "A" at 9:15 A.M., then check in with the Admitting office. ? Call this number if you have problems the morning of surgery: ? [phone] ? ? If you have any questions prior to your surgery date call [phone]: Open Monday-Friday 8am-4pm ? ? ? Remember: ? Do not eat after midnight the night before your surgery ? ?You may drink clear liquids until 8:15am the morning of your surgery.   ?Clear liquids allowed are: Water, Non-Citrus Juices (without pulp), Carbonated Beverages, Clear Tea, Black Coffee ONLY (NO MILK, CREAM OR POWDERED CREAMER of any kind), and Gatorade ?  ? Take these medicines the morning of surgery with A SIP OF WATER: ?ADVAIR HFA 230-21 MCG/ACT inhaler - please bring with you to the hospital ?atorvastatin (LIPITOR) 20 MG tablet ?divalproex (DEPAKOTE ER) 500 MG 24 hr tablet ?pantoprazole (PROTONIX) 40 MG tablet ? ?IF NEEDED  ?acetaminophen (TYLENOL) 500 MG tablet ?meclizine (ANTIVERT) 25 MG tablet ?mometasone (NASONEX) 50 MCG/ACT nasal spray ?PROAIR HFA 108 (90 Base) MCG/ACT inhaler- please bring with you to the hospital ? ? ?As of today, STOP taking any Aspirin (unless otherwise instructed by your surgeon) Aleve, Naproxen, Ibuprofen, Motrin, Advil, Goody's, BC's, all herbal medications, fish oil, and all vitamins. ? ?         ?Do not wear jewelry or makeup ?Do not wear lotions, powders, perfumes, or deodorant. ?Do not shave 48 hours prior to surgery.   ?Do not bring valuables to the hospital. ?Do not wear nail polish, gel polish, artificial nails, or any other type of covering on natural nails (fingers and toes) ?If you have artificial nails or gel coating that need to be removed by a nail salon, please have this removed prior to surgery. Artificial nails or gel coating may interfere with anesthesia's ability to adequately monitor your vital signs. ? ?[place] is not responsible for  any belongings or valuables. .  ? ?Do NOT Smoke (Tobacco/Vaping)  24 hours prior to your procedure ? ?If you use a CPAP at night, you may bring your mask for your overnight stay. ?  ?Contacts, glasses, hearing aids, dentures or partials may not be worn into surgery, please bring cases for these belongings ?  ?For patients admitted to the hospital, discharge time will be determined by your treatment team. ?  ?Patients discharged the day of surgery will not be allowed to drive home, and someone needs to stay with them for 24 hours. ? ?NO VISITORS WILL BE ALLOWED IN PRE-OP WHERE PATIENTS ARE PREPPED FOR SURGERY.  ONLY 1 SUPPORT PERSON MAY BE PRESENT IN THE WAITING ROOM WHILE YOU ARE IN SURGERY.  IF YOU ARE TO BE ADMITTED, ONCE YOU ARE IN YOUR ROOM YOU WILL BE ALLOWED TWO (2) VISITORS. 1 (ONE) VISITOR MAY STAY OVERNIGHT BUT MUST ARRIVE TO THE ROOM BY 8pm.  Minor children may have two parents present. Special consideration for safety and communication needs will be reviewed on a case by case basis. ? ?Special instructions:   ? ?Oral Hygiene is also important to reduce your risk of infection.  Remember - BRUSH YOUR TEETH THE MORNING OF SURGERY WITH YOUR REGULAR TOOTHPASTE ? ? ?[place] [facility]- Preparing For Surgery ? ?Before surgery, you can play an important role. Because skin is not sterile, your skin needs to be as free of germs as possible. You can reduce the number of germs on your skin by  washing with CHG (chlorahexidine gluconate) Soap before surgery.  CHG is an antiseptic cleaner which kills germs and bonds with the skin to continue killing germs even after washing.   ? ? ?Please do not use if you have an allergy to CHG or antibacterial soaps. If your skin becomes reddened/irritated stop using the CHG.  ?Do not shave (including legs and underarms) for at least 48 hours prior to first CHG shower. It is OK to shave your face. ? ?Please follow these instructions carefully. ?  ? ? Shower the NIGHT BEFORE SURGERY and the  MORNING OF SURGERY with CHG Soap.  ? If you chose to wash your hair, wash your hair first as usual with your normal shampoo. After you shampoo, rinse your hair and body thoroughly to remove the shampoo.  Then [employer] and genitals (private parts) with your normal soap and rinse thoroughly to remove soap. ? ?After that Use CHG Soap as you would any other liquid soap. You can apply CHG directly to the skin and wash gently with a scrungie or a clean washcloth.  ? ?Apply the CHG Soap to your body ONLY FROM THE NECK DOWN.  Do not use on open wounds or open sores. Avoid contact with your eyes, ears, mouth and genitals (private parts). Wash Face and genitals (private parts)  with your normal soap.  ? ?Wash thoroughly, paying special attention to the area where your surgery will be performed. ? ?Thoroughly rinse your body with warm water from the neck down. ? ?DO NOT shower/wash with your normal soap after using and rinsing off the CHG Soap. ? ?Pat yourself dry with a CLEAN TOWEL. ? ?Wear CLEAN PAJAMAS to bed the night before surgery ? ?Place CLEAN SHEETS on your bed the night before your surgery ? ?DO NOT SLEEP WITH PETS. ? ? ?Day of Surgery: ? ?Take a shower with CHG soap. ?Wear Clean/Comfortable clothing the morning of surgery ?Do not apply any deodorants/lotions.   ?Remember to brush your teeth WITH YOUR REGULAR TOOTHPASTE. ? ? ? ?COVID testing ? ?If you are going to stay overnight or be admitted after your procedure/surgery and require a pre-op COVID test, please follow these instructions after your COVID test  ? ?You are not required to quarantine however you are required to wear a well-fitting mask when you are out and around people not in your household.  If your mask becomes wet or soiled, replace with a new one. ? ?Wash your hands often with soap and water for 20 seconds or clean your hands with an alcohol-based hand sanitizer that contains at least 60% alcohol. ? ?Do not share personal items. ? ?Notify your  provider: ?if you are in close contact with someone who has COVID  ?or if you develop a fever of 100.4 or greater, sneezing, cough, sore throat, shortness of breath or body aches. ? ?  ?Please read over the following fact sheets that you were given.  ? ?

## 2021-05-02 ENCOUNTER — Encounter (HOSPITAL_COMMUNITY)
Admission: RE | Admit: 2021-05-02 | Discharge: 2021-05-02 | Disposition: A | Discharge status: Home / Self Care | Payer: No Typology Code available for payment source | Source: Ambulatory Visit | Attending: Surgery | Admitting: Surgery

## 2021-05-02 ENCOUNTER — Encounter (HOSPITAL_COMMUNITY): Payer: Self-pay

## 2021-05-02 ENCOUNTER — Other Ambulatory Visit: Payer: Self-pay

## 2021-05-02 VITALS — BP 146/82 | HR 87 | Temp 97.7°F | Resp 18 | Ht 63.0 in | Wt 205.8 lb

## 2021-05-02 DIAGNOSIS — R569 Unspecified convulsions: Secondary | ICD-10-CM | POA: Insufficient documentation

## 2021-05-02 DIAGNOSIS — Z79899 Other long term (current) drug therapy: Secondary | ICD-10-CM | POA: Insufficient documentation

## 2021-05-02 DIAGNOSIS — Z01818 Encounter for other preprocedural examination: Secondary | ICD-10-CM

## 2021-05-02 DIAGNOSIS — J4550 Severe persistent asthma, uncomplicated: Secondary | ICD-10-CM | POA: Insufficient documentation

## 2021-05-02 DIAGNOSIS — Z20822 Contact with and (suspected) exposure to covid-19: Secondary | ICD-10-CM | POA: Insufficient documentation

## 2021-05-02 DIAGNOSIS — D6800 Von Willebrand disease, unspecified: Secondary | ICD-10-CM | POA: Insufficient documentation

## 2021-05-02 LAB — CBC
HCT: 42.2 % (ref 36.0–46.0)
Hemoglobin: 13.9 g/dL (ref 12.0–15.0)
MCH: 29.4 pg (ref 26.0–34.0)
MCHC: 32.9 g/dL (ref 30.0–36.0)
MCV: 89.4 fL (ref 80.0–100.0)
Platelets: 388 10*3/uL (ref 150–400)
RBC: 4.72 MIL/uL (ref 3.87–5.11)
RDW: 13.7 % (ref 11.5–15.5)
WBC: 12.7 10*3/uL — ABNORMAL HIGH (ref 4.0–10.5)
nRBC: 0 % (ref 0.0–0.2)

## 2021-05-02 LAB — BASIC METABOLIC PANEL
Anion gap: 9 (ref 5–15)
BUN: 10 mg/dL (ref 6–20)
CO2: 29 mmol/L (ref 22–32)
Calcium: 9 mg/dL (ref 8.9–10.3)
Chloride: 102 mmol/L (ref 98–111)
Creatinine, Ser: 0.78 mg/dL (ref 0.44–1.00)
GFR, Estimated: >60 mL/min (ref >60)
Glucose, Bld: 110 mg/dL — ABNORMAL HIGH (ref 70–99)
Potassium: 3.7 mmol/L (ref 3.5–5.1)
Sodium: 140 mmol/L (ref 135–145)

## 2021-05-02 LAB — SARS CORONAVIRUS 2 (TAT 6-24 HRS): SARS Coronavirus 2: NEGATIVE

## 2021-05-02 NOTE — Progress Notes (Addendum)
PCP - Janie Morning, DO ?Cardiologist - denies ? ?PPM/ICD - denies ?Device Orders - n/a ?Rep Notified - n/a ? ?Chest x-ray - n/a ?EKG - 05/02/2021 ?Stress Test - denies ?ECHO - 09/23/2012 ?Cardiac Cath - denies ? ?Sleep Study - denies ?CPAP - n/a ? ?Fasting Blood Sugar - n/a ? ?Blood Thinner Instructions: n/a ? ?Aspirin Instructions: Patient was instructed: As of today, STOP taking any Aspirin (unless otherwise instructed by your surgeon) Aleve, Naproxen, Ibuprofen, Motrin, Advil, Goody's, BC's, all herbal medications, fish oil, and all vitamins. ?  ? ?ERAS Protcol - yes, clear liquids until 08:15 ? ?COVID TEST- done in PAT on 05/02/2021 ? ? ?Anesthesia review: yes - history of seizures; Von Willebrand disease  ? ?Patient denies shortness of breath, fever, cough and chest pain at PAT appointment ? ? ?All instructions explained to the patient, with a verbal understanding of the material. Patient agrees to go over the instructions while at home for a better understanding. Patient also instructed to self quarantine after being tested for COVID-19. The opportunity to ask questions was provided. ?  ?

## 2021-05-03 ENCOUNTER — Telehealth: Payer: Self-pay

## 2021-05-03 NOTE — Telephone Encounter (Signed)
Received call from Dr Limmie Patricia office at [place] wanting clarity about administering the DDVAP that was recommended 1hr prior to sx. On the form it was noted pt was was going to have a laparoscopic procedure. Per the nurse the surgery is a laparotomy with a colon resection and open cholecystectomy. The concern is about more blood and if the recommendation is still the same. Per Dr Marin Olp ok to still give DDAVP 1 hr prior to sx and also give 1 dose the day after surgery. Nurse advised.  ?

## 2021-05-03 NOTE — Progress Notes (Signed)
Anesthesia Chart Review: ? ?Patient follows with hematology for history of von Willebrand's disease.  Initially diagnosed in the 1990s after any significant bleeding after sinus surgery.  For prior surgery she has been prescribed DDAVP to be used preoperatively.  Instructions for this procedure were clarified in telephone encounter 05/03/2021, "Received call from Dr Lovett's office at [place] wanting clarity about administering the DDVAP that was recommended 1hr prior to sx. On the form it was noted pt was was going to have a laparoscopic procedure. Per the nurse the surgery is a laparotomy with a colon resection and open cholecystectomy. The concern is about more blood and if the recommendation is still the same. Per Dr Marin Olp ok to still give DDAVP 1 hr prior to sx and also give 1 dose the day after surgery. Nurse advised." ? ?Follows with neurosurgery for history of Chiari decompression and VP shunt insertion September 2018. ? ?History of seizures, maintained on Depakote. ? ?History of severe persistent asthma, maintained on Advair, Singulair, as needed albuterol. ? ?Preop labs reviewed, unremarkable. ? ?EKG 05/02/2021: NSR.  Rate 87.  Septal infarct, age undetermined.  Cannot rule out inferior infarct, age undetermined.  No significant change since last tracing. ? ? ? ?Cassidy Caldwell, PA-C ?[facility] Short Stay Center/Anesthesiology ?Phone [phone] ?05/03/2021 3:52 PM ? ? ?

## 2021-05-03 NOTE — Anesthesia Preprocedure Evaluation (Addendum)
Anesthesia Evaluation  ?Patient identified by MRN, date of birth, ID band ?Patient awake ? ? ? ?Reviewed: ?Allergy & Precautions, NPO status , Patient's Chart, lab work & pertinent test results ? ?Airway ?Mallampati: III ? ?TM Distance: >3 FB ?Neck ROM: Full ? ? ? Dental ? ?(+) Dental Advisory Given ?  ?Pulmonary ?asthma , Current Smoker and Patient abstained from smoking.,  ?  ?breath sounds clear to auscultation ? ? ? ? ? ? Cardiovascular ?hypertension, Pt. on medications ? ?Rhythm:Regular Rate:Normal ? ? ?  ?Neuro/Psych ?Seizures -,    ? GI/Hepatic ?Neg liver ROS, GERD  ,  ?Endo/Other  ?negative endocrine ROS ? Renal/GU ?negative Renal ROS  ? ?  ?Musculoskeletal ? ?(+) Arthritis ,  ? Abdominal ?  ?Peds ? Hematology ? ?(+) Blood dyscrasia (Type Ia Von Willebrand dz), ,   ?Anesthesia Other Findings ? ? Reproductive/Obstetrics ? ?  ? ? ? ? ? ? ? ? ? ? ? ? ? ?  ?  ? ? ? ? ? ? ? ?Lab Results  ?Component Value Date  ? WBC 12.7 (H) 05/02/2021  ? HGB 13.9 05/02/2021  ? HCT 42.2 05/02/2021  ? MCV 89.4 05/02/2021  ? PLT 388 05/02/2021  ? ?Lab Results  ?Component Value Date  ? CREATININE 0.78 05/02/2021  ? BUN 10 05/02/2021  ? NA 140 05/02/2021  ? K 3.7 05/02/2021  ? CL 102 05/02/2021  ? CO2 29 05/02/2021  ? ? ?Anesthesia Physical ?Anesthesia Plan ? ?ASA: 3 ? ?Anesthesia Plan: General  ? ?Post-op Pain Management: Gabapentin PO (pre-op)*, Tylenol PO (pre-op)* and Ketamine IV*  ? ?Induction: Intravenous ? ?PONV Risk Score and Plan: 2 and Dexamethasone, Ondansetron, Treatment may vary due to age or medical condition and Midazolam ? ?Airway Management Planned: Oral ETT ? ?Additional Equipment: None ? ?Intra-op Plan:  ? ?Post-operative Plan: Extubation in OR ? ?Informed Consent: I have reviewed the patients History and Physical, chart, labs and discussed the procedure including the risks, benefits and alternatives for the proposed anesthesia with the patient or authorized representative who has  indicated his/her understanding and acceptance.  ? ? ? ?Dental advisory given ? ?Plan Discussed with: CRNA ? ?Anesthesia Plan Comments: (Per discussion with Dr. Bobbye Morton, Dr. Antonieta Pert recommendation is for IV DDAVP 1 hr prior to surgery and one day after. Discussed TXA with Dr. Bobbye Morton who agrees. Plan GETA. 2 IV's. ? ?PAT note by Karoline Caldwell, PA-C: ?Patient follows with hematology for history of von Willebrand's disease.  Initially diagnosed in the 1990s after any significant bleeding after sinus surgery.  For prior surgery she has been prescribed DDAVP to be used preoperatively.  Instructions for this procedure were clarified in telephone encounter 05/03/2021, "Received call from Dr Lovett's office at [place] wanting clarity about administering the DDVAP that was recommended 1hr prior to sx.?On the form it was noted pt was was going to have a laparoscopic procedure. Per the nurse the surgery is a laparotomy with a colon resection and open cholecystectomy. The concern is about more blood and if the recommendation is still the same. Per Dr Marin Olp ok to still give DDAVP 1 hr prior to sx and also give 1 dose the day after surgery. Nurse advised." ? ?Follows with neurosurgery for history of Chiari decompression and VP shunt insertion September 2018. ? ?History of seizures, maintained on Depakote. ? ?History of severe persistent asthma, maintained on Advair, Singulair, as needed albuterol. ? ?Preop labs reviewed, unremarkable. ? ?EKG 05/02/2021: NSR.  Rate 87.  Septal infarct, age undetermined.  Cannot rule out inferior infarct, age undetermined.  No significant change since last tracing. ?)  ? ? ? ? ? ?Anesthesia Quick Evaluation ? ?

## 2021-05-04 ENCOUNTER — Ambulatory Visit: Payer: Self-pay | Admitting: Surgery

## 2021-05-04 MED ORDER — SODIUM CHLORIDE 0.9 % IV SOLN: 20.0000 ug | INTRAVENOUS | Status: AC

## 2021-05-05 ENCOUNTER — Other Ambulatory Visit: Payer: Self-pay

## 2021-05-05 ENCOUNTER — Inpatient Hospital Stay (HOSPITAL_COMMUNITY): Payer: No Typology Code available for payment source | Admitting: Physician Assistant

## 2021-05-05 ENCOUNTER — Inpatient Hospital Stay (HOSPITAL_COMMUNITY)
Admission: RE | Admit: 2021-05-05 | Discharge: 2021-06-15 | DRG: 003 | Disposition: A | Discharge status: Rehabilitation Facility | Payer: No Typology Code available for payment source | Attending: Surgery | Admitting: Surgery

## 2021-05-05 ENCOUNTER — Encounter (HOSPITAL_COMMUNITY): Payer: Self-pay | Admitting: Surgery

## 2021-05-05 ENCOUNTER — Inpatient Hospital Stay (HOSPITAL_COMMUNITY): Payer: No Typology Code available for payment source | Admitting: Anesthesiology

## 2021-05-05 ENCOUNTER — Other Ambulatory Visit: Payer: Self-pay | Admitting: Hematology & Oncology

## 2021-05-05 ENCOUNTER — Encounter (HOSPITAL_COMMUNITY)
Admission: RE | Disposition: A | Discharge status: Rehabilitation Facility | Payer: Self-pay | Source: Home / Self Care | Attending: Surgery

## 2021-05-05 DIAGNOSIS — Z982 Presence of cerebrospinal fluid drainage device: Secondary | ICD-10-CM

## 2021-05-05 DIAGNOSIS — E873 Alkalosis: Secondary | ICD-10-CM | POA: Diagnosis not present

## 2021-05-05 DIAGNOSIS — R7401 Elevation of levels of liver transaminase levels: Secondary | ICD-10-CM | POA: Diagnosis not present

## 2021-05-05 DIAGNOSIS — D630 Anemia in neoplastic disease: Secondary | ICD-10-CM | POA: Diagnosis present

## 2021-05-05 DIAGNOSIS — D3A8 Other benign neuroendocrine tumors: Secondary | ICD-10-CM | POA: Diagnosis not present

## 2021-05-05 DIAGNOSIS — G935 Compression of brain: Secondary | ICD-10-CM | POA: Diagnosis present

## 2021-05-05 DIAGNOSIS — F17210 Nicotine dependence, cigarettes, uncomplicated: Secondary | ICD-10-CM | POA: Diagnosis present

## 2021-05-05 DIAGNOSIS — R4689 Other symptoms and signs involving appearance and behavior: Secondary | ICD-10-CM

## 2021-05-05 DIAGNOSIS — Y92239 Unspecified place in hospital as the place of occurrence of the external cause: Secondary | ICD-10-CM | POA: Diagnosis not present

## 2021-05-05 DIAGNOSIS — C772 Secondary and unspecified malignant neoplasm of intra-abdominal lymph nodes: Secondary | ICD-10-CM | POA: Diagnosis present

## 2021-05-05 DIAGNOSIS — Y95 Nosocomial condition: Secondary | ICD-10-CM | POA: Diagnosis not present

## 2021-05-05 DIAGNOSIS — B3731 Acute candidiasis of vulva and vagina: Secondary | ICD-10-CM | POA: Diagnosis not present

## 2021-05-05 DIAGNOSIS — E669 Obesity, unspecified: Secondary | ICD-10-CM | POA: Diagnosis present

## 2021-05-05 DIAGNOSIS — D62 Acute posthemorrhagic anemia: Secondary | ICD-10-CM | POA: Diagnosis not present

## 2021-05-05 DIAGNOSIS — Q430 Meckel's diverticulum (displaced) (hypertrophic): Secondary | ICD-10-CM

## 2021-05-05 DIAGNOSIS — R5381 Other malaise: Secondary | ICD-10-CM | POA: Diagnosis not present

## 2021-05-05 DIAGNOSIS — L89621 Pressure ulcer of left heel, stage 1: Secondary | ICD-10-CM | POA: Diagnosis not present

## 2021-05-05 DIAGNOSIS — L8990 Pressure ulcer of unspecified site, unspecified stage: Secondary | ICD-10-CM | POA: Insufficient documentation

## 2021-05-05 DIAGNOSIS — E739 Lactose intolerance, unspecified: Secondary | ICD-10-CM | POA: Diagnosis present

## 2021-05-05 DIAGNOSIS — E639 Nutritional deficiency, unspecified: Secondary | ICD-10-CM

## 2021-05-05 DIAGNOSIS — Z823 Family history of stroke: Secondary | ICD-10-CM

## 2021-05-05 DIAGNOSIS — R1310 Dysphagia, unspecified: Secondary | ICD-10-CM | POA: Diagnosis not present

## 2021-05-05 DIAGNOSIS — D6489 Other specified anemias: Secondary | ICD-10-CM | POA: Diagnosis not present

## 2021-05-05 DIAGNOSIS — J9601 Acute respiratory failure with hypoxia: Secondary | ICD-10-CM | POA: Diagnosis not present

## 2021-05-05 DIAGNOSIS — R109 Unspecified abdominal pain: Secondary | ICD-10-CM | POA: Diagnosis not present

## 2021-05-05 DIAGNOSIS — J4550 Severe persistent asthma, uncomplicated: Secondary | ICD-10-CM | POA: Diagnosis not present

## 2021-05-05 DIAGNOSIS — K828 Other specified diseases of gallbladder: Secondary | ICD-10-CM | POA: Diagnosis present

## 2021-05-05 DIAGNOSIS — R569 Unspecified convulsions: Secondary | ICD-10-CM | POA: Diagnosis not present

## 2021-05-05 DIAGNOSIS — Z8042 Family history of malignant neoplasm of prostate: Secondary | ICD-10-CM

## 2021-05-05 DIAGNOSIS — T50995A Adverse effect of other drugs, medicaments and biological substances, initial encounter: Secondary | ICD-10-CM | POA: Diagnosis not present

## 2021-05-05 DIAGNOSIS — R579 Shock, unspecified: Secondary | ICD-10-CM | POA: Diagnosis not present

## 2021-05-05 DIAGNOSIS — E870 Hyperosmolality and hypernatremia: Secondary | ICD-10-CM | POA: Diagnosis not present

## 2021-05-05 DIAGNOSIS — D6801 Von willebrand disease, type 1: Secondary | ICD-10-CM | POA: Diagnosis present

## 2021-05-05 DIAGNOSIS — G9340 Encephalopathy, unspecified: Secondary | ICD-10-CM | POA: Diagnosis not present

## 2021-05-05 DIAGNOSIS — Z20822 Contact with and (suspected) exposure to covid-19: Secondary | ICD-10-CM | POA: Diagnosis present

## 2021-05-05 DIAGNOSIS — I10 Essential (primary) hypertension: Secondary | ICD-10-CM | POA: Diagnosis present

## 2021-05-05 DIAGNOSIS — Z4659 Encounter for fitting and adjustment of other gastrointestinal appliance and device: Secondary | ICD-10-CM

## 2021-05-05 DIAGNOSIS — J440 Chronic obstructive pulmonary disease with acute lower respiratory infection: Secondary | ICD-10-CM | POA: Diagnosis not present

## 2021-05-05 DIAGNOSIS — G928 Other toxic encephalopathy: Secondary | ICD-10-CM | POA: Diagnosis not present

## 2021-05-05 DIAGNOSIS — R609 Edema, unspecified: Secondary | ICD-10-CM | POA: Diagnosis not present

## 2021-05-05 DIAGNOSIS — D6800 Von Willebrand disease, unspecified: Secondary | ICD-10-CM

## 2021-05-05 DIAGNOSIS — Z880 Allergy status to penicillin: Secondary | ICD-10-CM

## 2021-05-05 DIAGNOSIS — J810 Acute pulmonary edema: Secondary | ICD-10-CM | POA: Diagnosis not present

## 2021-05-05 DIAGNOSIS — Z881 Allergy status to other antibiotic agents status: Secondary | ICD-10-CM

## 2021-05-05 DIAGNOSIS — R197 Diarrhea, unspecified: Secondary | ICD-10-CM | POA: Diagnosis not present

## 2021-05-05 DIAGNOSIS — E7220 Disorder of urea cycle metabolism, unspecified: Secondary | ICD-10-CM | POA: Diagnosis present

## 2021-05-05 DIAGNOSIS — Z886 Allergy status to analgesic agent status: Secondary | ICD-10-CM

## 2021-05-05 DIAGNOSIS — N809 Endometriosis, unspecified: Secondary | ICD-10-CM | POA: Diagnosis present

## 2021-05-05 DIAGNOSIS — N179 Acute kidney failure, unspecified: Secondary | ICD-10-CM | POA: Diagnosis present

## 2021-05-05 DIAGNOSIS — Y92009 Unspecified place in unspecified non-institutional (private) residence as the place of occurrence of the external cause: Secondary | ICD-10-CM | POA: Diagnosis not present

## 2021-05-05 DIAGNOSIS — J9690 Respiratory failure, unspecified, unspecified whether with hypoxia or hypercapnia: Secondary | ICD-10-CM

## 2021-05-05 DIAGNOSIS — Z87410 Personal history of cervical dysplasia: Secondary | ICD-10-CM

## 2021-05-05 DIAGNOSIS — Z800 Family history of malignant neoplasm of digestive organs: Secondary | ICD-10-CM

## 2021-05-05 DIAGNOSIS — Z6837 Body mass index (BMI) 37.0-37.9, adult: Secondary | ICD-10-CM

## 2021-05-05 DIAGNOSIS — Z85828 Personal history of other malignant neoplasm of skin: Secondary | ICD-10-CM

## 2021-05-05 DIAGNOSIS — C7A8 Other malignant neuroendocrine tumors: Principal | ICD-10-CM

## 2021-05-05 DIAGNOSIS — Z833 Family history of diabetes mellitus: Secondary | ICD-10-CM

## 2021-05-05 DIAGNOSIS — F419 Anxiety disorder, unspecified: Secondary | ICD-10-CM | POA: Diagnosis present

## 2021-05-05 DIAGNOSIS — R4182 Altered mental status, unspecified: Secondary | ICD-10-CM | POA: Diagnosis not present

## 2021-05-05 DIAGNOSIS — T426X6A Underdosing of other antiepileptic and sedative-hypnotic drugs, initial encounter: Secondary | ICD-10-CM | POA: Diagnosis present

## 2021-05-05 DIAGNOSIS — Z01818 Encounter for other preprocedural examination: Secondary | ICD-10-CM

## 2021-05-05 DIAGNOSIS — J4540 Moderate persistent asthma, uncomplicated: Secondary | ICD-10-CM | POA: Diagnosis present

## 2021-05-05 DIAGNOSIS — D649 Anemia, unspecified: Secondary | ICD-10-CM | POA: Diagnosis not present

## 2021-05-05 DIAGNOSIS — E876 Hypokalemia: Secondary | ICD-10-CM | POA: Diagnosis not present

## 2021-05-05 DIAGNOSIS — Z807 Family history of other malignant neoplasms of lymphoid, hematopoietic and related tissues: Secondary | ICD-10-CM

## 2021-05-05 DIAGNOSIS — Z430 Encounter for attention to tracheostomy: Secondary | ICD-10-CM | POA: Diagnosis not present

## 2021-05-05 DIAGNOSIS — Z90710 Acquired absence of both cervix and uterus: Secondary | ICD-10-CM

## 2021-05-05 DIAGNOSIS — J189 Pneumonia, unspecified organism: Secondary | ICD-10-CM | POA: Diagnosis not present

## 2021-05-05 DIAGNOSIS — R509 Fever, unspecified: Secondary | ICD-10-CM | POA: Diagnosis not present

## 2021-05-05 DIAGNOSIS — J449 Chronic obstructive pulmonary disease, unspecified: Secondary | ICD-10-CM | POA: Diagnosis not present

## 2021-05-05 DIAGNOSIS — J9602 Acute respiratory failure with hypercapnia: Secondary | ICD-10-CM | POA: Diagnosis not present

## 2021-05-05 DIAGNOSIS — Z930 Tracheostomy status: Secondary | ICD-10-CM | POA: Diagnosis not present

## 2021-05-05 DIAGNOSIS — Z9911 Dependence on respirator [ventilator] status: Secondary | ICD-10-CM | POA: Diagnosis not present

## 2021-05-05 DIAGNOSIS — Z885 Allergy status to narcotic agent status: Secondary | ICD-10-CM

## 2021-05-05 DIAGNOSIS — G40309 Generalized idiopathic epilepsy and epileptic syndromes, not intractable, without status epilepticus: Secondary | ICD-10-CM | POA: Diagnosis not present

## 2021-05-05 DIAGNOSIS — R1110 Vomiting, unspecified: Secondary | ICD-10-CM | POA: Diagnosis not present

## 2021-05-05 DIAGNOSIS — G40909 Epilepsy, unspecified, not intractable, without status epilepticus: Secondary | ICD-10-CM | POA: Diagnosis present

## 2021-05-05 DIAGNOSIS — E781 Pure hyperglyceridemia: Secondary | ICD-10-CM | POA: Diagnosis present

## 2021-05-05 DIAGNOSIS — I272 Pulmonary hypertension, unspecified: Secondary | ICD-10-CM | POA: Diagnosis not present

## 2021-05-05 DIAGNOSIS — T502X5A Adverse effect of carbonic-anhydrase inhibitors, benzothiadiazides and other diuretics, initial encounter: Secondary | ICD-10-CM | POA: Diagnosis not present

## 2021-05-05 DIAGNOSIS — Z83438 Family history of other disorder of lipoprotein metabolism and other lipidemia: Secondary | ICD-10-CM

## 2021-05-05 DIAGNOSIS — Z8249 Family history of ischemic heart disease and other diseases of the circulatory system: Secondary | ICD-10-CM

## 2021-05-05 HISTORY — PX: COLON RESECTION: SHX5231

## 2021-05-05 HISTORY — PX: LAPAROTOMY: SHX154

## 2021-05-05 HISTORY — PX: CHOLECYSTECTOMY: SHX55

## 2021-05-05 LAB — TYPE AND SCREEN
ABO/RH(D): O POS
Antibody Screen: NEGATIVE

## 2021-05-05 SURGERY — LAPAROTOMY, EXPLORATORY
Anesthesia: General | Site: Abdomen

## 2021-05-05 MED ORDER — PROPOFOL 10 MG/ML IV BOLUS
INTRAVENOUS | Status: DC | PRN
  Administered 2021-05-05: 150 mg via INTRAVENOUS

## 2021-05-05 MED ORDER — ORAL CARE MOUTH RINSE
15.0000 mL | Freq: Once | OROMUCOSAL | Status: AC
Start: 2021-05-05 — End: 2021-05-05

## 2021-05-05 MED ORDER — FENTANYL CITRATE (PF) 100 MCG/2ML IJ SOLN
INTRAMUSCULAR | Status: DC | PRN
  Administered 2021-05-05: 50 ug via INTRAVENOUS
  Administered 2021-05-05: 100 ug via INTRAVENOUS
  Administered 2021-05-05: 50 ug via INTRAVENOUS

## 2021-05-05 MED ORDER — SUGAMMADEX SODIUM 200 MG/2ML IV SOLN
INTRAVENOUS | Status: DC | PRN
  Administered 2021-05-05: 200 mg via INTRAVENOUS

## 2021-05-05 MED ORDER — MECLIZINE HCL 25 MG PO TABS
25.0000 mg | ORAL_TABLET | Freq: Three times a day (TID) | ORAL | Status: DC | PRN
  Filled 2021-05-05: qty 1

## 2021-05-05 MED ORDER — SODIUM CHLORIDE 0.9 % IV SOLN
INTRAVENOUS | Status: AC
  Filled 2021-05-05: qty 20

## 2021-05-05 MED ORDER — KETOROLAC TROMETHAMINE 15 MG/ML IJ SOLN
30.0000 mg | Freq: Four times a day (QID) | INTRAMUSCULAR | Status: AC
  Administered 2021-05-05 – 2021-05-10 (×19): 30 mg via INTRAVENOUS
  Filled 2021-05-05 (×19): qty 2

## 2021-05-05 MED ORDER — AMISULPRIDE (ANTIEMETIC) 5 MG/2ML IV SOLN: 10.0000 mg | Freq: Once | INTRAVENOUS | Status: DC | PRN

## 2021-05-05 MED ORDER — FENTANYL CITRATE (PF) 250 MCG/5ML IJ SOLN
INTRAMUSCULAR | Status: AC
  Filled 2021-05-05: qty 5

## 2021-05-05 MED ORDER — MIDAZOLAM HCL 2 MG/2ML IJ SOLN
INTRAMUSCULAR | Status: DC | PRN
  Administered 2021-05-05: 2 mg via INTRAVENOUS

## 2021-05-05 MED ORDER — ONDANSETRON HCL 4 MG/2ML IJ SOLN
INTRAMUSCULAR | Status: AC
  Filled 2021-05-05: qty 2

## 2021-05-05 MED ORDER — 0.9 % SODIUM CHLORIDE (POUR BTL) OPTIME
TOPICAL | Status: DC | PRN
  Administered 2021-05-05 (×3): 1000 mL

## 2021-05-05 MED ORDER — GABAPENTIN 300 MG PO CAPS
300.0000 mg | ORAL_CAPSULE | Freq: Once | ORAL | Status: AC
Start: 2021-05-05 — End: 2021-05-05
  Administered 2021-05-05: 11:00:00 300 mg via ORAL
  Filled 2021-05-05: qty 1

## 2021-05-05 MED ORDER — ROCURONIUM BROMIDE 10 MG/ML (PF) SYRINGE
PREFILLED_SYRINGE | INTRAVENOUS | Status: DC | PRN
  Administered 2021-05-05: 100 mg via INTRAVENOUS

## 2021-05-05 MED ORDER — ALVIMOPAN 12 MG PO CAPS
12.0000 mg | ORAL_CAPSULE | Freq: Two times a day (BID) | ORAL | Status: DC
Start: 2021-05-06 — End: 2021-05-07
  Administered 2021-05-06: 12 mg via ORAL
  Filled 2021-05-05 (×3): qty 1

## 2021-05-05 MED ORDER — KETAMINE HCL 50 MG/5ML IJ SOSY
PREFILLED_SYRINGE | INTRAMUSCULAR | Status: AC
  Filled 2021-05-05: qty 5

## 2021-05-05 MED ORDER — HYDROMORPHONE HCL 1 MG/ML IJ SOLN
0.2500 mg | INTRAMUSCULAR | Status: DC | PRN
  Administered 2021-05-05: 15:00:00 1 mg via INTRAVENOUS

## 2021-05-05 MED ORDER — DIVALPROEX SODIUM ER 500 MG PO TB24
500.0000 mg | ORAL_TABLET | Freq: Every day | ORAL | Status: DC
  Administered 2021-05-06 – 2021-05-10 (×5): 500 mg via ORAL
  Filled 2021-05-05 (×7): qty 1

## 2021-05-05 MED ORDER — ONDANSETRON 4 MG PO TBDP
4.0000 mg | ORAL_TABLET | Freq: Four times a day (QID) | ORAL | Status: DC | PRN
  Administered 2021-06-05: 4 mg via ORAL
  Filled 2021-05-05: qty 1

## 2021-05-05 MED ORDER — SODIUM CHLORIDE 0.9 % IV SOLN
2.0000 g | INTRAVENOUS | Status: AC
Start: 2021-05-05 — End: 2021-05-05
  Administered 2021-05-05: 12:00:00 2 g via INTRAVENOUS
  Filled 2021-05-05: qty 20

## 2021-05-05 MED ORDER — DOCUSATE SODIUM 100 MG PO CAPS
100.0000 mg | ORAL_CAPSULE | Freq: Two times a day (BID) | ORAL | Status: DC
  Administered 2021-05-05 – 2021-05-10 (×5): 100 mg via ORAL
  Filled 2021-05-05 (×7): qty 1

## 2021-05-05 MED ORDER — ZONISAMIDE 100 MG PO CAPS
100.0000 mg | ORAL_CAPSULE | Freq: Every day | ORAL | Status: DC
  Administered 2021-05-05 – 2021-05-12 (×7): 100 mg via ORAL
  Filled 2021-05-05 (×9): qty 1

## 2021-05-05 MED ORDER — SODIUM CHLORIDE 0.9 % IV SOLN
20.0000 ug | INTRAVENOUS | Status: DC
  Filled 2021-05-05: qty 5

## 2021-05-05 MED ORDER — ONDANSETRON HCL 4 MG/2ML IJ SOLN
4.0000 mg | Freq: Four times a day (QID) | INTRAMUSCULAR | Status: DC | PRN
  Administered 2021-05-07 – 2021-06-15 (×22): 4 mg via INTRAVENOUS
  Filled 2021-05-05 (×22): qty 2

## 2021-05-05 MED ORDER — METRONIDAZOLE 500 MG/100ML IV SOLN
INTRAVENOUS | Status: AC
  Filled 2021-05-05: qty 100

## 2021-05-05 MED ORDER — OXYCODONE HCL 5 MG PO TABS
5.0000 mg | ORAL_TABLET | Freq: Once | ORAL | Status: AC | PRN
  Administered 2021-05-05: 15:00:00 5 mg via ORAL

## 2021-05-05 MED ORDER — MELATONIN 3 MG PO TABS
9.0000 mg | ORAL_TABLET | Freq: Every day | ORAL | Status: DC
  Administered 2021-05-05 – 2021-05-06 (×2): 9 mg via ORAL
  Filled 2021-05-05 (×2): qty 3

## 2021-05-05 MED ORDER — FLUTICASONE PROPIONATE 50 MCG/ACT NA SUSP
1.0000 | Freq: Every day | NASAL | Status: DC
Start: 2021-05-05 — End: 2021-05-16
  Administered 2021-05-07 – 2021-05-11 (×5): 1 via NASAL
  Filled 2021-05-05: qty 16

## 2021-05-05 MED ORDER — CHLORHEXIDINE GLUCONATE 0.12 % MT SOLN
15.0000 mL | Freq: Once | OROMUCOSAL | Status: AC
  Filled 2021-05-05: qty 15

## 2021-05-05 MED ORDER — EPINEPHRINE 1 MG/10ML IJ SOSY
PREFILLED_SYRINGE | INTRAMUSCULAR | Status: AC
  Filled 2021-05-05: qty 10

## 2021-05-05 MED ORDER — PHENYLEPHRINE 40 MCG/ML (10ML) SYRINGE FOR IV PUSH (FOR BLOOD PRESSURE SUPPORT)
PREFILLED_SYRINGE | INTRAVENOUS | Status: AC
  Filled 2021-05-05: qty 10

## 2021-05-05 MED ORDER — OXYCODONE HCL 5 MG/5ML PO SOLN
5.0000 mg | ORAL | Status: DC | PRN
  Administered 2021-05-05 – 2021-05-06 (×3): 10 mg via ORAL
  Filled 2021-05-05 (×2): qty 10

## 2021-05-05 MED ORDER — MEPERIDINE HCL 25 MG/ML IJ SOLN: 6.2500 mg | INTRAMUSCULAR | Status: DC | PRN

## 2021-05-05 MED ORDER — METHOCARBAMOL 1000 MG/10ML IJ SOLN
1000.0000 mg | Freq: Three times a day (TID) | INTRAVENOUS | Status: DC
  Administered 2021-05-05 – 2021-05-08 (×7): 1000 mg via INTRAVENOUS
  Filled 2021-05-05: qty 10
  Filled 2021-05-05: qty 1000
  Filled 2021-05-05 (×4): qty 10
  Filled 2021-05-05: qty 1000
  Filled 2021-05-05 (×3): qty 10

## 2021-05-05 MED ORDER — METRONIDAZOLE 500 MG/100ML IV SOLN
500.0000 mg | INTRAVENOUS | Status: AC
  Administered 2021-05-05: 12:00:00 500 mg via INTRAVENOUS
  Filled 2021-05-05: qty 100

## 2021-05-05 MED ORDER — DIVALPROEX SODIUM ER 500 MG PO TB24
1000.0000 mg | ORAL_TABLET | Freq: Every day | ORAL | Status: DC
  Administered 2021-05-05 – 2021-05-10 (×6): 1000 mg via ORAL
  Filled 2021-05-05 (×7): qty 2

## 2021-05-05 MED ORDER — HYDROMORPHONE HCL 1 MG/ML IJ SOLN
INTRAMUSCULAR | Status: AC
Start: 2021-05-05 — End: 2021-05-06
  Filled 2021-05-05: qty 1

## 2021-05-05 MED ORDER — MIDAZOLAM HCL 2 MG/2ML IJ SOLN
INTRAMUSCULAR | Status: AC
  Filled 2021-05-05: qty 2

## 2021-05-05 MED ORDER — ENOXAPARIN SODIUM 40 MG/0.4ML IJ SOSY
40.0000 mg | PREFILLED_SYRINGE | INTRAMUSCULAR | Status: DC
  Administered 2021-05-06 – 2021-06-15 (×39): 40 mg via SUBCUTANEOUS
  Filled 2021-05-05 (×40): qty 0.4

## 2021-05-05 MED ORDER — MOMETASONE FURO-FORMOTEROL FUM 200-5 MCG/ACT IN AERO
2.0000 | INHALATION_SPRAY | Freq: Two times a day (BID) | RESPIRATORY_TRACT | Status: DC
  Administered 2021-05-06 – 2021-05-10 (×7): 2 via RESPIRATORY_TRACT
  Filled 2021-05-05: qty 8.8

## 2021-05-05 MED ORDER — ALBUTEROL SULFATE (2.5 MG/3ML) 0.083% IN NEBU
2.5000 mg | INHALATION_SOLUTION | Freq: Four times a day (QID) | RESPIRATORY_TRACT | Status: DC | PRN
  Administered 2021-05-14 – 2021-05-20 (×3): 2.5 mg via RESPIRATORY_TRACT
  Filled 2021-05-05 (×4): qty 3

## 2021-05-05 MED ORDER — PANTOPRAZOLE SODIUM 40 MG PO TBEC
40.0000 mg | DELAYED_RELEASE_TABLET | Freq: Every day | ORAL | Status: DC
  Administered 2021-05-06 – 2021-05-10 (×5): 40 mg via ORAL
  Filled 2021-05-05 (×5): qty 1

## 2021-05-05 MED ORDER — DEXAMETHASONE SODIUM PHOSPHATE 10 MG/ML IJ SOLN
INTRAMUSCULAR | Status: DC | PRN
  Administered 2021-05-05: 10 mg via INTRAVENOUS

## 2021-05-05 MED ORDER — AMLODIPINE BESYLATE 10 MG PO TABS
10.0000 mg | ORAL_TABLET | Freq: Every day | ORAL | Status: DC
  Administered 2021-05-06 – 2021-05-10 (×5): 10 mg via ORAL
  Filled 2021-05-05 (×5): qty 1

## 2021-05-05 MED ORDER — CHLORHEXIDINE GLUCONATE CLOTH 2 % EX PADS: 6.0000 | MEDICATED_PAD | Freq: Once | CUTANEOUS | Status: DC

## 2021-05-05 MED ORDER — ACETAMINOPHEN 500 MG PO TABS
1000.0000 mg | ORAL_TABLET | Freq: Once | ORAL | Status: AC
  Administered 2021-05-05: 11:00:00 1000 mg via ORAL
  Filled 2021-05-05: qty 2

## 2021-05-05 MED ORDER — ONDANSETRON HCL 4 MG/2ML IJ SOLN
INTRAMUSCULAR | Status: DC | PRN
  Administered 2021-05-05: 4 mg via INTRAVENOUS

## 2021-05-05 MED ORDER — DEXAMETHASONE SODIUM PHOSPHATE 10 MG/ML IJ SOLN
INTRAMUSCULAR | Status: AC
  Filled 2021-05-05: qty 1

## 2021-05-05 MED ORDER — SODIUM CHLORIDE 0.9 % IV SOLN: 0.3000 ug/kg | Freq: Once | INTRAVENOUS | Status: DC

## 2021-05-05 MED ORDER — MORPHINE SULFATE (PF) 2 MG/ML IV SOLN
2.0000 mg | INTRAVENOUS | Status: DC | PRN
  Administered 2021-05-05 – 2021-05-06 (×2): 4 mg via INTRAVENOUS
  Administered 2021-05-06: 2 mg via INTRAVENOUS
  Administered 2021-05-06: 4 mg via INTRAVENOUS
  Administered 2021-05-06: 2 mg via INTRAVENOUS
  Filled 2021-05-05: qty 2
  Filled 2021-05-05: qty 1
  Filled 2021-05-05 (×3): qty 2

## 2021-05-05 MED ORDER — PHENYLEPHRINE 40 MCG/ML (10ML) SYRINGE FOR IV PUSH (FOR BLOOD PRESSURE SUPPORT)
PREFILLED_SYRINGE | INTRAVENOUS | Status: DC | PRN
  Administered 2021-05-05 (×3): 80 ug via INTRAVENOUS

## 2021-05-05 MED ORDER — ALVIMOPAN 12 MG PO CAPS
12.0000 mg | ORAL_CAPSULE | ORAL | Status: AC
  Administered 2021-05-05: 10:00:00 12 mg via ORAL
  Filled 2021-05-05: qty 1

## 2021-05-05 MED ORDER — HEPARIN SODIUM (PORCINE) 5000 UNIT/ML IJ SOLN
INTRAMUSCULAR | Status: AC
  Filled 2021-05-05: qty 1

## 2021-05-05 MED ORDER — HEPARIN SODIUM (PORCINE) 5000 UNIT/ML IJ SOLN
5000.0000 [IU] | Freq: Once | INTRAMUSCULAR | Status: AC
  Administered 2021-05-05: 10:00:00 5000 [IU] via SUBCUTANEOUS
  Filled 2021-05-05: qty 1

## 2021-05-05 MED ORDER — ACETAMINOPHEN 10 MG/ML IV SOLN
1000.0000 mg | Freq: Four times a day (QID) | INTRAVENOUS | Status: AC
  Administered 2021-05-05 – 2021-05-06 (×3): 1000 mg via INTRAVENOUS
  Filled 2021-05-05 (×3): qty 100

## 2021-05-05 MED ORDER — LIDOCAINE 2% (20 MG/ML) 5 ML SYRINGE
INTRAMUSCULAR | Status: DC | PRN
  Administered 2021-05-05: 80 mg via INTRAVENOUS

## 2021-05-05 MED ORDER — LACTATED RINGERS IV SOLN: INTRAVENOUS | Status: DC

## 2021-05-05 MED ORDER — AMLODIPINE BESYLATE-VALSARTAN 10-320 MG PO TABS: 1.0000 | ORAL_TABLET | Freq: Every day | ORAL | Status: DC

## 2021-05-05 MED ORDER — ALBUTEROL SULFATE HFA 108 (90 BASE) MCG/ACT IN AERS: 2.0000 | INHALATION_SPRAY | RESPIRATORY_TRACT | Status: DC | PRN

## 2021-05-05 MED ORDER — SODIUM CHLORIDE 0.9 % IV SOLN
25.0000 mg | Freq: Four times a day (QID) | INTRAVENOUS | Status: DC | PRN
  Filled 2021-05-05 (×2): qty 1

## 2021-05-05 MED ORDER — OXYCODONE HCL 5 MG PO TABS
ORAL_TABLET | ORAL | Status: AC
  Filled 2021-05-05: qty 3

## 2021-05-05 MED ORDER — IRBESARTAN 150 MG PO TABS
300.0000 mg | ORAL_TABLET | Freq: Every day | ORAL | Status: DC
  Administered 2021-05-06 – 2021-05-10 (×5): 300 mg via ORAL
  Filled 2021-05-05: qty 1
  Filled 2021-05-05: qty 2
  Filled 2021-05-05 (×3): qty 1

## 2021-05-05 MED ORDER — CHLORHEXIDINE GLUCONATE 0.12 % MT SOLN
OROMUCOSAL | Status: AC
  Administered 2021-05-05: 10:00:00 15 mL via OROMUCOSAL
  Filled 2021-05-05: qty 15

## 2021-05-05 MED ORDER — LIDOCAINE 2% (20 MG/ML) 5 ML SYRINGE
INTRAMUSCULAR | Status: AC
  Filled 2021-05-05: qty 5

## 2021-05-05 MED ORDER — DIVALPROEX SODIUM ER 500 MG PO TB24: 500.0000 mg | ORAL_TABLET | ORAL | Status: DC

## 2021-05-05 MED ORDER — OXYCODONE HCL 5 MG/5ML PO SOLN: 5.0000 mg | Freq: Once | ORAL | Status: AC | PRN

## 2021-05-05 MED ORDER — SODIUM CHLORIDE 0.9 % IV SOLN
0.3000 ug/kg | Freq: Once | INTRAVENOUS | Status: AC
  Administered 2021-05-05: 11:00:00 28 ug via INTRAVENOUS
  Filled 2021-05-05: qty 7

## 2021-05-05 MED ORDER — PROMETHAZINE HCL 25 MG/ML IJ SOLN: 6.2500 mg | INTRAMUSCULAR | Status: DC | PRN

## 2021-05-05 MED ORDER — MONTELUKAST SODIUM 10 MG PO TABS
10.0000 mg | ORAL_TABLET | Freq: Every day | ORAL | Status: DC
Start: 2021-05-05 — End: 2021-05-11
  Administered 2021-05-05 – 2021-05-10 (×6): 10 mg via ORAL
  Filled 2021-05-05 (×6): qty 1

## 2021-05-05 MED ORDER — ROCURONIUM BROMIDE 10 MG/ML (PF) SYRINGE
PREFILLED_SYRINGE | INTRAVENOUS | Status: AC
  Filled 2021-05-05: qty 10

## 2021-05-05 MED ORDER — ATORVASTATIN CALCIUM 10 MG PO TABS
20.0000 mg | ORAL_TABLET | Freq: Every day | ORAL | Status: DC
  Administered 2021-05-06 – 2021-05-10 (×5): 20 mg via ORAL
  Filled 2021-05-05 (×5): qty 2

## 2021-05-05 MED ORDER — SODIUM CHLORIDE 0.9 % IV SOLN: 20.0000 ug | Freq: Once | INTRAVENOUS | Status: DC

## 2021-05-05 MED ORDER — ALVIMOPAN 12 MG PO CAPS
ORAL_CAPSULE | ORAL | Status: AC
  Filled 2021-05-05: qty 1

## 2021-05-05 SURGICAL SUPPLY — 60 items
APL PRP STRL LF DISP 70% ISPRP (MISCELLANEOUS) ×1
BAG COUNTER SPONGE SURGICOUNT (BAG) ×3 IMPLANT
BAG SPNG CNTER NS LX DISP (BAG) ×1
BLADE CLIPPER SURG (BLADE) IMPLANT
CANISTER SUCT 3000ML PPV (MISCELLANEOUS) ×3 IMPLANT
CELLS DAT CNTRL 66122 CELL SVR (MISCELLANEOUS) IMPLANT
CHLORAPREP W/TINT 26 (MISCELLANEOUS) ×3 IMPLANT
COVER SURGICAL LIGHT HANDLE (MISCELLANEOUS) ×5 IMPLANT
DRAPE LAPAROSCOPIC ABDOMINAL (DRAPES) ×2 IMPLANT
DRAPE UNIVERSAL (DRAPES) ×2 IMPLANT
DRAPE WARM FLUID 44X44 (DRAPES) ×3 IMPLANT
DRSG OPSITE POSTOP 4X10 (GAUZE/BANDAGES/DRESSINGS) ×1 IMPLANT
DRSG OPSITE POSTOP 4X8 (GAUZE/BANDAGES/DRESSINGS) IMPLANT
ELECT BLADE 6.5 EXT (BLADE) IMPLANT
ELECT CAUTERY BLADE 6.4 (BLADE) ×6 IMPLANT
ELECT REM PT RETURN 9FT ADLT (ELECTROSURGICAL) ×2
ELECTRODE REM PT RTRN 9FT ADLT (ELECTROSURGICAL) ×2 IMPLANT
GLOVE SURG ENC MOIS LTX SZ6.5 (GLOVE) ×3 IMPLANT
GLOVE SURG ENC MOIS LTX SZ7.5 (GLOVE) ×4 IMPLANT
GLOVE SURG UNDER LTX SZ8 (GLOVE) ×4 IMPLANT
GLOVE SURG UNDER POLY LF SZ6 (GLOVE) ×3 IMPLANT
GOWN STRL REUS W/ TWL LRG LVL3 (GOWN DISPOSABLE) ×6 IMPLANT
GOWN STRL REUS W/ TWL XL LVL3 (GOWN DISPOSABLE) ×4 IMPLANT
GOWN STRL REUS W/TWL LRG LVL3 (GOWN DISPOSABLE) ×6
GOWN STRL REUS W/TWL XL LVL3 (GOWN DISPOSABLE) ×4
HANDLE SUCTION POOLE (INSTRUMENTS) ×2 IMPLANT
KIT BASIN OR (CUSTOM PROCEDURE TRAY) ×3 IMPLANT
KIT SIGMOIDOSCOPE (SET/KITS/TRAYS/PACK) IMPLANT
KIT TURNOVER KIT B (KITS) ×3 IMPLANT
LIGASURE IMPACT 36 18CM CVD LR (INSTRUMENTS) IMPLANT
NS IRRIG 1000ML POUR BTL (IV SOLUTION) ×6 IMPLANT
PACK COLON (CUSTOM PROCEDURE TRAY) ×3 IMPLANT
PACK GENERAL/GYN (CUSTOM PROCEDURE TRAY) ×2 IMPLANT
PAD ARMBOARD 7.5X6 YLW CONV (MISCELLANEOUS) ×3 IMPLANT
PENCIL SMOKE EVACUATOR (MISCELLANEOUS) ×2 IMPLANT
RELOAD PROXIMATE 75MM BLUE (ENDOMECHANICALS) ×10 IMPLANT
RELOAD STAPLE 75 3.8 BLU REG (ENDOMECHANICALS) IMPLANT
RETRACTOR WND ALEXIS 18 MED (MISCELLANEOUS) IMPLANT
RETRACTOR WND ALEXIS 25 LRG (MISCELLANEOUS) IMPLANT
RTRCTR WOUND ALEXIS 18CM MED (MISCELLANEOUS)
RTRCTR WOUND ALEXIS 25CM LRG (MISCELLANEOUS)
SPONGE T-LAP 18X18 ~~LOC~~+RFID (SPONGE) ×2 IMPLANT
STAPLER PROXIMATE 75MM BLUE (STAPLE) ×1 IMPLANT
STAPLER VISISTAT 35W (STAPLE) ×3 IMPLANT
SUCTION POOLE HANDLE (INSTRUMENTS)
SURGILUBE 2OZ TUBE FLIPTOP (MISCELLANEOUS) IMPLANT
SUT PDS AB 1 TP1 54 (SUTURE) IMPLANT
SUT PDS AB 1 TP1 96 (SUTURE) ×6 IMPLANT
SUT PROLENE 2 0 CT2 30 (SUTURE) IMPLANT
SUT PROLENE 2 0 KS (SUTURE) IMPLANT
SUT SILK 2 0 SH CR/8 (SUTURE) ×3 IMPLANT
SUT SILK 2 0 TIES 10X30 (SUTURE) ×3 IMPLANT
SUT SILK 3 0 SH CR/8 (SUTURE) ×3 IMPLANT
SUT SILK 3 0 TIES 10X30 (SUTURE) ×3 IMPLANT
SUT VIC AB 3-0 SH 18 (SUTURE) IMPLANT
TOWEL GREEN STERILE (TOWEL DISPOSABLE) ×3 IMPLANT
TRAY FOLEY MTR SLVR 14FR STAT (SET/KITS/TRAYS/PACK) IMPLANT
TRAY FOLEY MTR SLVR 16FR STAT (SET/KITS/TRAYS/PACK) ×1 IMPLANT
TUBE CONNECTING 12X1/4 (SUCTIONS) ×6 IMPLANT
YANKAUER SUCT BULB TIP NO VENT (SUCTIONS) IMPLANT

## 2021-05-05 NOTE — Op Note (Signed)
? ?  Operative Note ? ? ?Date: 05/05/2021 ? ?Procedure: exploratory laparotomy, cholecystectomy, R hemicolectomy ? ?Pre-op diagnosis: biliary dyskinesia, neuroendocrine tumor of terminal ileum ?Post-op diagnosis: biliary dyskinesia, neuroendocrine tumor of terminal ileum, Meckels diverticulum ? ?Indication and clinical history: The patient is a 46 y.o. year old female with biliary dyskinesia and neuroendocrine tumor of terminal ileum    ? ?Surgeon: [name], MD ?Assistant: Barry Dienes, MD ? ?Anesthesiologist: Marcie Bal, MD ?Anesthesia: General ? ?Findings:  ?Specimen: small bowel, R hemicolectomy ?EBL: 20cc ?Drains/Implants: none ? ?Disposition: PACU - hemodynamically stable. ? ?Description of procedure: The patient was positioned supine on the operating room table. General anesthetic induction and intubation were uneventful. Foley catheter insertion was performed and was atraumatic. Time-out was performed verifying correct patient, procedure, signature of informed consent, and administration of pre-operative antibiotics. Foley insertion was atraumatic. The abdomen was prepped and draped in the usual sterile fashion. ? ?A midline incision was made and deepened through the fascia until the peritoneal cavity was entered. The right upper quadrant was explored and the gallbladder excised. The cystic artery was suture ligated with 0 silk. The cystic duct was doubly suture ligated with 0 silk. Attention then moved to the right lower quadrant. The white line of Toldt was incised to free the right colon. The terminal ileum was densely adhesed deep in the pelvis. Careful adhesiolysis was performed to free the bowel. The terminal ileum was palpated and a mass noted ~50cm proximal to the ileocecal valve. A small bowel resection was performed. The specimen was opened on the back table, but the palpable mass appeared to be more consistent with a Meckels diverticulum. Again the terminal ileum was palpated and a nodule was palpated  just proximal to the ileocecal valve. A right hemicolectomy was then performed. The specimen was opened on the back table and the mass appeared more consistent with a neuroendocrine tumor. Both specimens were sent to pathology as permanent. A primary stapled anastomosis was created. The mesenteric defect was closed. The abdomen was irrigated. The fascia was closed with #1 looped PDS suture. The skin was closed with staples.  ? ?Sterile dressings were applied. All sponge and instrument counts were correct at the conclusion of the procedure. The patient was awakened from anesthesia, extubated uneventfully, and transported to the PACU in good condition. There were no complications.  ? ? ?[name], MD ?General and Trauma Surgery ?[facility] ? ?

## 2021-05-05 NOTE — Anesthesia Procedure Notes (Signed)
Procedure Name: Intubation ?Date/Time: 05/05/2021 12:23 PM ?Performed by: Moshe Salisbury, CRNA ?Pre-anesthesia Checklist: Patient identified, Emergency Drugs available, Suction available and Patient being monitored ?Patient Re-evaluated:Patient Re-evaluated prior to induction ?Oxygen Delivery Method: Circle System Utilized ?Preoxygenation: Pre-oxygenation with 100% oxygen ?Induction Type: IV induction ?Ventilation: Mask ventilation without difficulty ?Laryngoscope Size: Mac and 3 ?Grade View: Grade II ?Tube type: Oral ?Tube size: 7.5 mm ?Number of attempts: 1 ?Airway Equipment and Method: Stylet ?Placement Confirmation: ETT inserted through vocal cords under direct vision, positive ETCO2 and breath sounds checked- equal and bilateral ?Secured at: 22 cm ?Tube secured with: Tape ?Dental Injury: Teeth and Oropharynx as per pre-operative assessment  ? ? ? ? ?

## 2021-05-05 NOTE — Progress Notes (Signed)
Called pharmacy to dose DDAVP which was ordered by Dr. Rodman Comp. Will notify OR and Dr. Bobbye Morton. ? ?

## 2021-05-05 NOTE — H&P (Addendum)
? ? ?Cassidy Tran is an 46 y.o. female.  ? 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 Plan for exlap, open chole, R hemicolectomy. The patient has had no hospitalizations, ER visits, surgeries, or newly diagnosed allergies since being seen in the office. Was seen by telehealth for allergies since being seen in my office.  ? ? ?Past Medical History:  ?Diagnosis Date  ? Acid reflux   ? Allergy   ? Anxiety   ? Arthritis   ? lower back  ? ASCUS (atypical squamous cells of undetermined significance) on Pap smear 03/2011  ? NEG HR HPV  ? Asthma   ? Cancer [facility])   ? skin cancer- age 78ish  ? Cervical dysplasia, mild 08/2010  ? LGSIL colposcopy biopsy showing koilocytotic atypia  ? Clotting disorder ([place])   ? Depression   ? Endometriosis   ? Headache(784.0)   ? High risk HPV infection 12/2011  ? Pap normal  ? Hypertension   ? Pneumonia   ? 2016ish  ? Seizures ([place])   ? seizure disorder  ? Smoker   ? Von Willebrand disease   ? ? ?Past Surgical History:  ?Procedure Laterality Date  ? APPLICATION OF CRANIAL NAVIGATION N/A 11/24/2016  ? Procedure: APPLICATION OF CRANIAL NAVIGATION;  Surgeon: Kristeen Miss, MD;  Location: [facility];  Service: Neurosurgery;  Laterality: N/A;  ? BRAIN SURGERY    ? BREAST BIOPSY Left   ? COLPOSCOPY    ? CYSTOSCOPY N/A 10/14/2012  ? Procedure: CYSTOSCOPY;  Surgeon: [name], MD;  Location: [facility];  Service: Gynecology;  Laterality: N/A;  ? DILATION AND CURETTAGE OF UTERUS    ? HYSTEROSCOPY WITH D & C  10/14/2010  ? Procedure: DILATATION AND CURETTAGE (D&C) /HYSTEROSCOPY;  Surgeon: [name], MD;  Location: [facility];  Service: Gynecology;  Laterality: N/A;  ? LAPAROSCOPIC HYSTERECTOMY N/A 10/14/2012  ? Procedure: HYSTERECTOMY TOTAL LAPAROSCOPIC;  Surgeon: [name], MD;  Location: [facility];  Service: Gynecology;  Laterality: N/A;  CPT [phone] ? ?2 1/2 hours ? ?Dr. Uvaldo Rising to assist.  ? LAPAROSCOPY  04/27/2011  ? Procedure: LAPAROSCOPY OPERATIVE;   Surgeon: [name], MD;  Location: [facility];  Service: Gynecology;  Laterality: N/A;  removal right cyst , lysis of adhesions, biopsy of peritoneum  ? NASAL SINUS SURGERY  1993  ? IN [place], [place]  ? SUBOCCIPITAL CRANIECTOMY CERVICAL LAMINECTOMY N/A 11/06/2016  ? Procedure: Suboccipital decompression for chiari malformation;  Surgeon: Kristeen Miss, MD;  Location: [facility];  Service: Neurosurgery;  Laterality: N/A;  ? TONSILLECTOMY    ? VENTRICULOPERITONEAL SHUNT N/A 11/24/2016  ? Procedure: SHUNT INSERTION VENTRICULAR-PERITONEAL with Brainlab;  Surgeon: Kristeen Miss, MD;  Location: [place];  Service: Neurosurgery;  Laterality: N/A;  right side approach  ? [place]  ? ? ?Family History  ?Problem Relation Age of Onset  ? Hypertension Mother   ? Heart disease Mother   ? Hyperlipidemia Mother   ? Diabetes Father   ? Hypertension Father   ? Heart disease Father   ? Hyperlipidemia Father   ? Stroke Father   ? Prostate cancer Father   ? Lymphoma Sister   ?     NON-HODGKINS LYMPHOMA  ? Cancer Maternal Uncle   ?     Lung and pancreatic cancer  ? Colon cancer Neg Hx   ? Rectal cancer Neg Hx   ? Esophageal cancer Neg Hx   ? ? ?Social  History:  reports that she has been smoking cigarettes. She has a 10.00 pack-year smoking history. She has never used smokeless tobacco. She reports that she does not currently use alcohol. She reports that she does not use drugs. ? ?Allergies:  ?Allergies  ?Allergen Reactions  ? Aspirin Other (See Comments)  ?  Childhood reaction  ? Codeine Nausea And Vomiting  ?  Tolerates Hydrocodone  ? Erythromycin Other (See Comments)  ?  Childhood reaction, tolerate Zpak ?Childhood reaction does not know.  ? Lactose Intolerance (Gi) Diarrhea  ? Nsaids Nausea And Vomiting  ? Penicillins Other (See Comments)  ?  Has patient had a PCN reaction causing immediate rash, facial/tongue/throat swelling, SOB or lightheadedness with hypotension: doesn't remember childhood Reaction  ?Has patient had  a PCN reaction causing severe rash involving mucus membranes or skin necrosis: NO ?Has patient had a PCN reaction that required hospitalization NO ?Has patient had a PCN reaction occurring within the last 10 years: NO ?If all of the above answers are "NO", then may proceed with Cephalosporin use. ?  ? ? ?Medications: I have reviewed the patient's current medications. ? ?Results for orders placed or performed during the hospital encounter of 05/05/21 (from the past 48 hour(s))  ?Type and screen [place]     Status: None (Preliminary result)  ? Collection Time: 05/05/21 11:00 AM  ?Result Value Ref Range  ? ABO/RH(D) PENDING   ? Antibody Screen PENDING   ? Sample Expiration    ?  05/08/2021,2359 ?Performed at [facility], [place] [address] [place], [place] [address] ?  ? ? ?No results found. ? ?ROS ?10 point review of systems is negative except as listed above in HPI.  ? ?Physical Exam ?Blood pressure (!) 149/64, pulse 92, temperature (!) 97.5 ?F (36.4 ?C), temperature source Oral, resp. rate 18, height '5\' 3"'$  (1.6 m), weight 93.3 kg, last menstrual period 09/16/2012, SpO2 96 %. ?Constitutional: well-developed, well-nourished ?HEENT: pupils equal, round, reactive to light, 31m b/l, moist conjunctiva, external inspection of ears and nose normal, hearing intact ?Oropharynx: normal oropharyngeal mucosa, normal dentition ?Neck: no thyromegaly, trachea midline, no midline cervical tenderness to palpation ?Chest: breath sounds equal bilaterally, normal respiratory effort, no midline or lateral chest wall tenderness to palpation/deformity ?Abdomen: soft, NT, no bruising, no hepatosplenomegaly ?GU: normal female genitalia  ?Back: no wounds, no thoracic/lumbar spine tenderness to palpation, no thoracic/lumbar spine stepoffs ?Rectal: deferred ?Extremities: 2+ radial and pedal pulses bilaterally, intact motor and sensation bilateral UE and LE, no peripheral edema ?MSK: normal gait/station, no  clubbing/cyanosis of fingers/toes, normal ROM of all four extremities ?Skin: warm, dry, no rashes ?Psych: normal memory, normal mood/affect  ? ?  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 Plan for exlap, open chole, R hemicolectomy. Discussed case with Dr. EMarin Olp plan for DDAVP 1h pre-op and on POD1. D/w Dr.Elsner, no recs for pre-op abx change from standard and no indication for additional abx post-op. Will be available post-op for any concerns. D/w patient that no concordant imaging to support NET in TI, defer octreotide scan in favor of surgical resection. Patient agreeable with this plan. Will plan to palpate TI to identify mass, if not palpable, have d/w GI on call, Dr. HBenson Norway who will plan for intra-op c-scope to endoscopically evaluate. D/w patient if unable to palpate and unable to endoscopically identify, will ONLY proceed with cholecystectomy. Patient verbalizes understanding and agreement. Informed consent was obtained after detailed explanation of risks, including  bleeding, infection, biloma, hematoma, injury to common bile duct, abscess, staple line leak, injury to surrounding structures. All questions answered to the patient's satisfaction. ? ? ?[name], MD ?General and Trauma Surgery ?[facility] ? ? ? ? ? ? ?

## 2021-05-05 NOTE — Transfer of Care (Signed)
Immediate Anesthesia Transfer of Care Note ? ?Patient: Cassidy Tran ? ?Procedure(s) Performed: EXPLORATORY LAPAROTOMY (Abdomen) ?RIGHT HEMICOLECTOMY (Abdomen) ?CHOLECYSTECTOMY (Abdomen) ? ?Patient Location: PACU ? ?Anesthesia Type:General ? ?Level of Consciousness: drowsy ? ?Airway & Oxygen Therapy: Patient Spontanous Breathing and Patient connected to nasal cannula oxygen ? ?Post-op Assessment: Report given to RN and Post -op Vital signs reviewed and stable ? ?Post vital signs: Reviewed and stable ? ?Last Vitals:  ?Vitals Value Taken Time  ?BP    ?Temp    ?Pulse    ?Resp    ?SpO2    ? ? ?Last Pain:  ?Vitals:  ? 05/05/21 0931  ?TempSrc:   ?PainSc: 0-No pain  ?   ? ?  ? ?Complications: No notable events documented. ?

## 2021-05-05 NOTE — Brief Op Note (Signed)
05/05/2021 ? ?3:06 PM ? ?PATIENT:  Cassidy Tran  46 y.o. female ? ?PRE-OPERATIVE DIAGNOSIS:  NEUROENDOCRINE TUMOR OF ILEUM, BILIARY DYSKINESIA ? ?POST-OPERATIVE DIAGNOSIS:  NEUROENDOCRINE TUMOR OF ILEUM, BILIARY DYSKINESIA ? ?PROCEDURE:  Procedure(s): ?EXPLORATORY LAPAROTOMY (N/A) ?RIGHT HEMICOLECTOMY (N/A) ?CHOLECYSTECTOMY (N/A) ? ?SURGEON:  Surgeon(s) and Role: ?   * Keiasia Christianson, Montel Culver, MD - Primary ?   Stark Klein, MD - Assisting ? ? ?ANESTHESIA:   general ? ?EBL:  50 mL  ? ?BLOOD ADMINISTERED:none ? ?DRAINS: none  ? ?LOCAL MEDICATIONS USED:  NONE ? ?SPECIMEN:  Source of Specimen:  small bowel, R colon ? ?DISPOSITION OF SPECIMEN:  PATHOLOGY ? ?COUNTS:  YES ? ?TOURNIQUET:  * No tourniquets in log * ? ?DICTATION: .Note written in EPIC ? ?PLAN OF CARE: Admit to inpatient  ? ?PATIENT DISPOSITION:  PACU - hemodynamically stable. ?  ?Delay start of Pharmacological VTE agent (>24hrs) due to surgical blood loss or risk of bleeding: no ? ?

## 2021-05-06 ENCOUNTER — Encounter (HOSPITAL_COMMUNITY): Payer: Self-pay | Admitting: Surgery

## 2021-05-06 DIAGNOSIS — C7A8 Other malignant neuroendocrine tumors: Secondary | ICD-10-CM | POA: Diagnosis not present

## 2021-05-06 LAB — CBC
HCT: 42.3 % (ref 36.0–46.0)
Hemoglobin: 13.6 g/dL (ref 12.0–15.0)
MCH: 29 pg (ref 26.0–34.0)
MCHC: 32.2 g/dL (ref 30.0–36.0)
MCV: 90.2 fL (ref 80.0–100.0)
Platelets: 340 10*3/uL (ref 150–400)
RBC: 4.69 MIL/uL (ref 3.87–5.11)
RDW: 14 % (ref 11.5–15.5)
WBC: 27.5 10*3/uL — ABNORMAL HIGH (ref 4.0–10.5)
nRBC: 0 % (ref 0.0–0.2)

## 2021-05-06 LAB — BASIC METABOLIC PANEL
Anion gap: 9 (ref 5–15)
BUN: 10 mg/dL (ref 6–20)
CO2: 25 mmol/L (ref 22–32)
Calcium: 8.9 mg/dL (ref 8.9–10.3)
Chloride: 102 mmol/L (ref 98–111)
Creatinine, Ser: 0.8 mg/dL (ref 0.44–1.00)
GFR, Estimated: >60 mL/min (ref >60)
Glucose, Bld: 159 mg/dL — ABNORMAL HIGH (ref 70–99)
Potassium: 4 mmol/L (ref 3.5–5.1)
Sodium: 136 mmol/L (ref 135–145)

## 2021-05-06 LAB — APTT: aPTT: 31 seconds (ref 24–36)

## 2021-05-06 LAB — HIV ANTIBODY (ROUTINE TESTING W REFLEX): HIV Screen 4th Generation wRfx: NONREACTIVE

## 2021-05-06 MED ORDER — STERILE WATER FOR INJECTION IJ SOLN
INTRAMUSCULAR | Status: AC
  Administered 2021-05-06: 20 mL
  Filled 2021-05-06: qty 20

## 2021-05-06 MED ORDER — ANTIHEMOPHILIC FACTOR-VWF 250-600 UNITS IV SOLR: 60.0000 [IU]/kg | Freq: Three times a day (TID) | INTRAVENOUS | Status: DC

## 2021-05-06 MED ORDER — OXYCODONE HCL 5 MG PO TABS
5.0000 mg | ORAL_TABLET | Freq: Once | ORAL | Status: AC
  Administered 2021-05-06: 5 mg via ORAL
  Filled 2021-05-06: qty 1

## 2021-05-06 MED ORDER — OXYCODONE HCL 5 MG PO TABS
10.0000 mg | ORAL_TABLET | ORAL | Status: DC | PRN
  Administered 2021-05-06: 15 mg via ORAL
  Filled 2021-05-06: qty 3

## 2021-05-06 MED ORDER — HYDROMORPHONE HCL 1 MG/ML IJ SOLN
0.5000 mg | INTRAMUSCULAR | Status: DC | PRN
  Administered 2021-05-06 (×2): 0.5 mg via INTRAVENOUS
  Filled 2021-05-06 (×2): qty 0.5

## 2021-05-06 MED ORDER — HYDROMORPHONE HCL 1 MG/ML IJ SOLN
1.0000 mg | INTRAMUSCULAR | Status: DC | PRN
  Administered 2021-05-07: 1 mg via INTRAVENOUS
  Filled 2021-05-06: qty 1

## 2021-05-06 MED ORDER — ANTIHEMOPHILIC FACTOR-VWF 250-600 UNITS IV SOLR
3724.0000 [IU] | Freq: Three times a day (TID) | INTRAVENOUS | Status: AC
  Administered 2021-05-06 – 2021-05-07 (×2): 3724 [IU] via INTRAVENOUS
  Filled 2021-05-06 (×3): qty 3724

## 2021-05-06 MED ORDER — LOPERAMIDE HCL 2 MG PO CAPS
2.0000 mg | ORAL_CAPSULE | Freq: Three times a day (TID) | ORAL | Status: DC
  Administered 2021-05-06 – 2021-05-10 (×10): 2 mg via ORAL
  Filled 2021-05-06 (×14): qty 1

## 2021-05-06 MED ORDER — ANTIHEMOPHILIC FACTOR-VWF 250-600 UNITS IV SOLR
3946.0000 [IU] | Freq: Once | INTRAVENOUS | Status: AC
  Administered 2021-05-06: 3946 [IU] via INTRAVENOUS
  Filled 2021-05-06: qty 3946

## 2021-05-06 MED ORDER — ACETAMINOPHEN 500 MG PO TABS
1000.0000 mg | ORAL_TABLET | Freq: Four times a day (QID) | ORAL | Status: DC
  Administered 2021-05-06 – 2021-05-11 (×12): 1000 mg via ORAL
  Filled 2021-05-06 (×14): qty 2

## 2021-05-06 MED ORDER — HYDROMORPHONE HCL 2 MG PO TABS
2.0000 mg | ORAL_TABLET | ORAL | Status: DC | PRN
  Administered 2021-05-06 – 2021-05-07 (×3): 4 mg via ORAL
  Filled 2021-05-06 (×3): qty 2

## 2021-05-06 NOTE — Progress Notes (Signed)
I stop by this morning to see Cassidy Tran.  She is a very nice 46 year old white female.  She has type IA von Willebrand disease.  In the past, depending on the surgery, she is had DDAVP.  She also has had humate-P. ? ?She underwent exploratory laparotomy yesterday.  She is post to have a laparoscopy.  However, it appeared to be more extensive disease and originally thought.  She also had a cholecystectomy. ? ?As far as I can tell, there is no obvious bleeding with surgery.  She did receive a dose of DDAVP prior to surgery. ? ?Given the fact that she has had more extensive surgery, I think we have to give her Humate-P in the postop setting. ? ?She is having quite a bit of pain.  I am not surprised by this.  She has been out of bed already.  She does have the sequential compression devices on her legs. ? ?Labs to her that we have back show a sodium 136.  Potassium 4.0.  BUN 10 creatinine 0.8.  Her white cell count is 27.5.  Hemoglobin 13.6.  Platelet count 340,000. ? ?I would like to try to avoid DDAVP with multiple doses just because of the risk of the possibility of hyponatremia. ? ?She has had Humate-P in the past without any difficulty.  The lesser that she had was a neurosurgical procedure back in 2018.  She had Humate-P without any difficulty. ? ?I would probably dose her every 8 hours for 3 doses.  Then I would probably give her 4 doses every 12 hours. ? ?I would like to make sure that her factor VIII level is over 75-80%.  This should be able to provide adequate hemostasis. ? ?We will have to see what the pathology results show.  Whether not she will need any kind of adjuvant therapy for the neuroendocrine cancer will be dictated by the pathology. ? ?We will monitor her factor VIII levels daily for right now. ? ?When I examined her, her vital signs are temperature 97.5.  Pulse 88.  Blood pressure 148/61.  Her lungs sound clear bilaterally.  Cardiac exam regular rate and rhythm.  Abdomen is somewhat distended.   Bowel sounds are not present.  There is the midline vertical laparotomy scar.  There is really no bleeding.  Extremity shows no clubbing, cyanosis or edema. ? ?Again, I want to make sure that she does not have any postop bleeding.  She did have more extensive surgical intervention than originally planned.  We will have her on Humate-P. ? ?We will certainly follow along and help out any way that we can. ? ?Obviously, she is in great hands with the surgeons and with all the wonderful staff up on [address]. ? ?Lattie Haw, MD ? ?Romans 5:3-5 ?

## 2021-05-06 NOTE — Anesthesia Postprocedure Evaluation (Signed)
Anesthesia Post Note ? ?Patient: Angeleigh Chiasson ? ?Procedure(s) Performed: EXPLORATORY LAPAROTOMY (Abdomen) ?RIGHT HEMICOLECTOMY (Abdomen) ?CHOLECYSTECTOMY (Abdomen) ? ?  ? ?Patient location during evaluation: PACU ?Anesthesia Type: General ?Level of consciousness: awake and alert ?Pain management: pain level controlled ?Vital Signs Assessment: post-procedure vital signs reviewed and stable ?Respiratory status: spontaneous breathing, nonlabored ventilation, respiratory function stable and patient connected to nasal cannula oxygen ?Cardiovascular status: blood pressure returned to baseline and stable ?Postop Assessment: no apparent nausea or vomiting ?Anesthetic complications: no ? ? ?No notable events documented. ? ?Last Vitals:  ?Vitals:  ? 05/06/21 0012 05/06/21 0500  ?BP: 130/62 (!) 148/61  ?Pulse: 88 88  ?Resp: 20   ?Temp: 36.4 ?C (!) 36.4 ?C  ?SpO2: 92% 90%  ?  ?Last Pain:  ?Vitals:  ? 05/06/21 0554  ?TempSrc:   ?PainSc: 7   ? ? ?  ?  ?  ?  ?  ?  ? ?[place] ? ? ? ? ?

## 2021-05-06 NOTE — Progress Notes (Signed)
? ?General Surgery Follow Up Note ? ?Subjective:  ?  ?Overnight Issues:  ? ?Objective:  ?Vital signs for last 24 hours: ?Temp:  [97.5 ?F (36.4 ?C)-98 ?F (36.7 ?C)] 97.5 ?F (36.4 ?C) (03/10 0500) ?Pulse Rate:  [71-92] 71 (03/10 0858) ?Resp:  [14-23] 17 (03/10 0858) ?BP: (110-148)/(59-71) 136/69 (03/10 0858) ?SpO2:  [90 %-99 %] 90 % (03/10 0500) ? ?Hemodynamic parameters for last 24 hours: ?  ? ?Intake/Output from previous day: ?03/09 0701 - 03/10 0700 ?In: 3783.6 [P.O.:2470; I.V.:1013.6; IV FWYOVZCHY:850] ?Out: 850 [Urine:800; Blood:50]  ?Intake/Output this shift: ?No intake/output data recorded. ? ?Vent settings for last 24 hours: ?  ? ?Physical Exam:  ?Gen: comfortable, no distress ?Neuro: non-focal exam ?HEENT: PERRL ?Neck: supple ?CV: RRR ?Pulm: unlabored breathing ?Abd: soft, NT, moderate drainage on dressing ?GU: clear yellow urine, foley ?Extr: wwp, no edema ? ? ?Results for orders placed or performed during the hospital encounter of 05/05/21 (from the past 24 hour(s))  ?Type and screen [place]     Status: None  ? Collection Time: 05/05/21 11:00 AM  ?Result Value Ref Range  ? ABO/RH(D) O POS   ? Antibody Screen NEG   ? Sample Expiration    ?  05/08/2021,2359 ?Performed at [facility], [place] [address], [place], [place] [address] ?  ?HIV Antibody (routine testing w rflx)     Status: None  ? Collection Time: 05/06/21 12:55 AM  ?Result Value Ref Range  ? HIV Screen 4th Generation wRfx Non Reactive Non Reactive  ?Basic metabolic panel     Status: Abnormal  ? Collection Time: 05/06/21 12:55 AM  ?Result Value Ref Range  ? Sodium 136 135 - 145 mmol/L  ? Potassium 4.0 3.5 - 5.1 mmol/L  ? Chloride 102 98 - 111 mmol/L  ? CO2 25 22 - 32 mmol/L  ? Glucose, Bld 159 (H) 70 - 99 mg/dL  ? BUN 10 6 - 20 mg/dL  ? Creatinine, Ser 0.80 0.44 - 1.00 mg/dL  ? Calcium 8.9 8.9 - 10.3 mg/dL  ? GFR, Estimated >60 >60 mL/min  ? Anion gap 9 5 - 15  ?CBC     Status: Abnormal  ? Collection Time: 05/06/21 12:55 AM   ?Result Value Ref Range  ? WBC 27.5 (H) 4.0 - 10.5 K/uL  ? RBC 4.69 3.87 - 5.11 MIL/uL  ? Hemoglobin 13.6 12.0 - 15.0 g/dL  ? HCT 42.3 36.0 - 46.0 %  ? MCV 90.2 80.0 - 100.0 fL  ? MCH 29.0 26.0 - 34.0 pg  ? MCHC 32.2 30.0 - 36.0 g/dL  ? RDW 14.0 11.5 - 15.5 %  ? Platelets 340 150 - 400 K/uL  ? nRBC 0.0 0.0 - 0.2 %  ?APTT     Status: None  ? Collection Time: 05/06/21  6:42 AM  ?Result Value Ref Range  ? aPTT 31 24 - 36 seconds  ? ? ?Assessment & Plan: ?The plan of care was discussed with the bedside nurse for the day, Ashli, who is in agreement with this plan and no additional concerns were raised.  ? ?Present on Admission: ? Neuroendocrine carcinoma ([place]) ? ? ? LOS: 1 day  ? ?Additional comments:I reviewed the patient's new clinical lab test results.   and I reviewed the patients new imaging test results.   ? ?Neuroendocrine tumor on colonoscopy - s/p exlap, R hemicolectomy, cholecystectomy 3/9. Await path.  ?FEN - CLD, AROBF ?DVT - SCDs, DDVAP, LMWH ?Foley - d/c today ?  Dispo - med surg. PT/OT, abdominal binder ? ? ?[name], MD ?Trauma & General Surgery ?Please use [email or web address] to contact on call provider ? ?05/06/2021 ? ?*Care during the described time interval was provided by me. I have reviewed this patient's available data, including medical history, events of note, physical examination and test results as part of my evaluation. ? ?

## 2021-05-07 ENCOUNTER — Encounter (HOSPITAL_COMMUNITY): Payer: Self-pay | Admitting: Surgery

## 2021-05-07 ENCOUNTER — Inpatient Hospital Stay (HOSPITAL_COMMUNITY): Payer: No Typology Code available for payment source

## 2021-05-07 DIAGNOSIS — C7A8 Other malignant neuroendocrine tumors: Secondary | ICD-10-CM | POA: Diagnosis not present

## 2021-05-07 LAB — FACTOR 8 ASSAY: Coagulation Factor VIII: 149 % — ABNORMAL HIGH (ref 56–140)

## 2021-05-07 MED ORDER — IOHEXOL 350 MG/ML SOLN
80.0000 mL | Freq: Once | INTRAVENOUS | Status: AC | PRN
  Administered 2021-05-07: 75 mL via INTRAVENOUS

## 2021-05-07 MED ORDER — SODIUM CHLORIDE 0.9 % IV SOLN
2.0000 g | Freq: Three times a day (TID) | INTRAVENOUS | Status: DC
  Administered 2021-05-07 – 2021-05-09 (×5): 2 g via INTRAVENOUS
  Filled 2021-05-07 (×5): qty 2

## 2021-05-07 MED ORDER — ANTIHEMOPHILIC FACTOR-VWF 250-600 UNITS IV SOLR
3724.0000 [IU] | Freq: Two times a day (BID) | INTRAVENOUS | Status: DC
  Administered 2021-05-07: 3724 [IU] via INTRAVENOUS
  Filled 2021-05-07 (×2): qty 3724

## 2021-05-07 NOTE — Significant Event (Signed)
Rapid Response Event Note  ? ?Reason for Call :  ?Order to tx to 3W PCU for closer monitoring but concern that pt on NRB, desats when off.  ? ?Pt has been sleepy t/o the day with low SpO2. D/t concern for PE, pt was taken for CTA chest. Pt was negative for PE but CT did show extensive B ground-glass pulm infiltrates concerning for pneumonia.  ? ?Initial Focused Assessment:  ?Pt sitting up in bed in no distress. She is alert and oriented, denies any SOB/chest pain. Lungs diminished t/o. Skin warm and dry.  ?When left alone, pt goes to sleep very easily.  ? ?T-97.6, HR-88, BP-116/74, RR-19, SpO2: ? ?98% on NRB ?80% on RA ?93% on 12L HFNC(salter) ? ?Interventions:  ?NRB>HFNC([place]ter) ?Tx to 4NP10 for closer monitoring.  ?Plan of Care:  ?Tx to 4NP. Keep SpO2 above 92% per MD order. Continue to monitor pt closely. Call RRT if further assistance needed.  ? ?Event Summary:  ? ?MD Notified: Dr. Bobbye Morton notified PTA RRT ?Call Time:2050 ?Arrival Time:2055 ?End Time:2140 ? ?Dillard Essex, RN ?

## 2021-05-07 NOTE — Progress Notes (Signed)
Pt transfer to 4N10. Husband at bedside. Belongings with the pt and family member. Report given to [place], Therapist, sports. ?

## 2021-05-07 NOTE — Progress Notes (Signed)
Cassidy Tran is doing okay.  She says she started to eat yesterday.  She is getting in the factor VIII replacement.  She is getting a third dose this morning. ? ?There has been no bleeding.  She is out of bed. ? ?There are no labs back yet.  I am not sure what the factor VIII level is. ? ?I would think that she can get 3 more doses of Factor VIII replacement and then be able to go home.  I think if she gets 2 more doses today and then 1 Sunday morning, that should be adequate enough for hemostasis after her laparotomy. ? ?Her vital signs are temperature 98.8.  Pulse 80.  Blood pressure 123/58.  Her lungs sound clear bilaterally.  Cardiac exam regular rate and rhythm.  Abdomen is still slightly distended.  Bowel sounds are decreased.  She has a laparotomy wound that is healing.  There is no bleeding noted.  There is no obvious liver or spleen tip.  Extremity shows no clubbing, cyanosis or edema.  Neurological exam is nonfocal. ? ?Cassidy Tran has von Willebrand disease.  She had a laparotomy for Wednesday neuroendocrine tumor.  There is no pathology back yet on the surgical specimen. ? ?Again, I would think that she should be okay to go home after a total of 6 doses of Factor VIII replacement.  This should give her adequate hemostasis. ? ?I do appreciate the wonderful care that she has gotten from everybody up on [address]. ? ?Lattie Haw, MD ? ?Colossians 3:2 ?

## 2021-05-07 NOTE — Progress Notes (Signed)
? ?  General Surgery Follow Up Note ? ?Subjective:  ?  ?Overnight Issues:  ? ?Objective:  ?Vital signs for last 24 hours: ?Temp:  [97.8 ?F (36.6 ?C)-98.8 ?F (37.1 ?C)] 98.8 ?F (37.1 ?C) (03/11 0535) ?Pulse Rate:  [71-80] 80 (03/11 0535) ?Resp:  [16-19] 17 (03/11 0535) ?BP: (110-136)/(56-87) 123/58 (03/11 0535) ?SpO2:  [89 %-93 %] 92 % (03/11 0535) ? ?Hemodynamic parameters for last 24 hours: ?  ? ?Intake/Output from previous day: ?03/10 0701 - 03/11 0700 ?In: 3386.4 [P.O.:1080; I.V.:1867.6; IV Piggyback:438.8] ?Out: -   ?Intake/Output this shift: ?Total I/O ?In: 1850.6 [P.O.:480; I.V.:1231.8; IV Piggyback:138.8] ?Out: -  ? ?Vent settings for last 24 hours: ?  ? ?Physical Exam:  ?Gen: comfortable, no distress ?Neuro: non-focal exam ?HEENT: PERRL ?Neck: supple ?CV: RRR ?Pulm: unlabored breathing ?Abd: soft, appropriately TTP, stable drainage on dresing ?GU: clear yellow urine ?Extr: wwp, no edema ? ? ?No results found for this or any previous visit (from the past 24 hour(s)). ? ?Assessment & Plan: ? ?Present on Admission: ? Neuroendocrine carcinoma ([place]) ? ? ? LOS: 2 days  ? ?Additional comments:I reviewed the patient's new clinical lab test results.   and I reviewed the patients new imaging test results.   ? ?Neuroendocrine tumor on colonoscopy - s/p exlap, R hemicolectomy, cholecystectomy 3/9. Await path.  ?FEN - reg diet ?DVT - SCDs, s/p DDVAP, LMWH, humate P per Heme ?Foley - d/c 3/10, spont voids ?Dispo - med surg. abdominal binder ? ? ?[name], MD ?Trauma & General Surgery ?Please use [email or web address] to contact on call provider ? ?05/07/2021 ? ?*Care during the described time interval was provided by me. I have reviewed this patient's available data, including medical history, events of note, physical examination and test results as part of my evaluation. ? ?

## 2021-05-07 NOTE — Progress Notes (Signed)
This RN went into room and pt's husband states that pt's O2 sats were low when checked. Husband then stated that pt has been very drowsy and dozing off whenever she wasn't walking in hall. Continous pulse ox placed. Sats were 83-86% on 3L. NRB mask placed. Sats up to 97-100% Rapid Response RN and MD on call notified. New orders placed ?

## 2021-05-08 ENCOUNTER — Inpatient Hospital Stay (HOSPITAL_COMMUNITY): Payer: No Typology Code available for payment source

## 2021-05-08 LAB — BASIC METABOLIC PANEL
Anion gap: 11 (ref 5–15)
BUN: 16 mg/dL (ref 6–20)
CO2: 23 mmol/L (ref 22–32)
Calcium: 8.4 mg/dL — ABNORMAL LOW (ref 8.9–10.3)
Chloride: 102 mmol/L (ref 98–111)
Creatinine, Ser: 1.09 mg/dL — ABNORMAL HIGH (ref 0.44–1.00)
GFR, Estimated: >60 mL/min (ref >60)
Glucose, Bld: 99 mg/dL (ref 70–99)
Potassium: 3.5 mmol/L (ref 3.5–5.1)
Sodium: 136 mmol/L (ref 135–145)

## 2021-05-08 LAB — CBC
HCT: 33.1 % — ABNORMAL LOW (ref 36.0–46.0)
Hemoglobin: 10.5 g/dL — ABNORMAL LOW (ref 12.0–15.0)
MCH: 28.8 pg (ref 26.0–34.0)
MCHC: 31.7 g/dL (ref 30.0–36.0)
MCV: 90.9 fL (ref 80.0–100.0)
Platelets: 252 10*3/uL (ref 150–400)
RBC: 3.64 MIL/uL — ABNORMAL LOW (ref 3.87–5.11)
RDW: 14.2 % (ref 11.5–15.5)
WBC: 16.1 10*3/uL — ABNORMAL HIGH (ref 4.0–10.5)
nRBC: 0 % (ref 0.0–0.2)

## 2021-05-08 LAB — BLOOD GAS, ARTERIAL
Acid-Base Excess: 1.7 mmol/L (ref 0.0–2.0)
Bicarbonate: 27.7 mmol/L (ref 20.0–28.0)
Drawn by: 53309
O2 Saturation: 96.7 %
Patient temperature: 36.7
pCO2 arterial: 47 mmHg (ref 32–48)
pH, Arterial: 7.37 (ref 7.35–7.45)
pO2, Arterial: 82 mmHg — ABNORMAL LOW (ref 83–108)

## 2021-05-08 MED ORDER — HYDROMORPHONE HCL 1 MG/ML IJ SOLN
0.5000 mg | INTRAMUSCULAR | Status: DC | PRN
  Administered 2021-05-08: 0.5 mg via INTRAVENOUS
  Filled 2021-05-08: qty 0.5
  Filled 2021-05-08: qty 1
  Filled 2021-05-08: qty 0.5

## 2021-05-08 MED ORDER — CHLORHEXIDINE GLUCONATE 0.12 % MT SOLN
15.0000 mL | Freq: Two times a day (BID) | OROMUCOSAL | Status: DC
  Administered 2021-05-08 – 2021-05-12 (×9): 15 mL via OROMUCOSAL
  Filled 2021-05-08 (×7): qty 15

## 2021-05-08 MED ORDER — IPRATROPIUM-ALBUTEROL 0.5-2.5 (3) MG/3ML IN SOLN
3.0000 mL | RESPIRATORY_TRACT | Status: AC
  Administered 2021-05-08 – 2021-05-10 (×9): 3 mL via RESPIRATORY_TRACT
  Filled 2021-05-08 (×11): qty 3

## 2021-05-08 MED ORDER — METHOCARBAMOL 500 MG PO TABS
1000.0000 mg | ORAL_TABLET | Freq: Three times a day (TID) | ORAL | Status: DC
Start: 2021-05-08 — End: 2021-05-08
  Administered 2021-05-08 (×2): 1000 mg via ORAL
  Filled 2021-05-08 (×2): qty 2

## 2021-05-08 MED ORDER — ORAL CARE MOUTH RINSE
15.0000 mL | Freq: Two times a day (BID) | OROMUCOSAL | Status: DC
  Administered 2021-05-09 – 2021-05-12 (×8): 15 mL via OROMUCOSAL

## 2021-05-08 MED ORDER — ANTIHEMOPHILIC FACTOR-VWF 250-600 UNITS IV SOLR
3724.0000 [IU] | Freq: Two times a day (BID) | INTRAVENOUS | Status: AC
  Administered 2021-05-08 (×2): 3724 [IU] via INTRAVENOUS
  Filled 2021-05-08: qty 0
  Filled 2021-05-08 (×2): qty 3724

## 2021-05-08 MED ORDER — BOOST / RESOURCE BREEZE PO LIQD CUSTOM
1.0000 | Freq: Three times a day (TID) | ORAL | Status: DC
Start: 2021-05-08 — End: 2021-05-11
  Administered 2021-05-08 – 2021-05-10 (×4): 1 via ORAL

## 2021-05-08 MED ORDER — LACTATED RINGERS IV SOLN: INTRAVENOUS | Status: DC

## 2021-05-08 MED ORDER — HYDROMORPHONE HCL 2 MG PO TABS
1.0000 mg | ORAL_TABLET | ORAL | Status: DC | PRN
  Administered 2021-05-08 – 2021-05-09 (×3): 2 mg via ORAL
  Filled 2021-05-08 (×3): qty 1

## 2021-05-08 NOTE — Progress Notes (Signed)
? ?  General Surgery Follow Up Note ? ?Subjective:  ?  ?Overnight Issues:  ? ?Objective:  ?Vital signs for last 24 hours: ?Temp:  [97.5 ?F (36.4 ?C)-98.1 ?F (36.7 ?C)] 97.5 ?F (36.4 ?C) (03/12 9741) ?Pulse Rate:  [69-89] 77 (03/12 0707) ?Resp:  [8-19] 11 (03/12 0707) ?BP: (106-120)/(52-74) 112/53 (03/12 6384) ?SpO2:  [80 %-98 %] 98 % (03/12 0707) ?Weight:  [96.2 kg] 96.2 kg (03/11 2207) ? ?Hemodynamic parameters for last 24 hours: ?  ? ?Intake/Output from previous day: ?03/11 0701 - 03/12 0700 ?In: 690.4 [I.V.:518.8; IV Piggyback:171.6] ?Out: -   ?Intake/Output this shift: ?Total I/O ?In: 5364 [I.V.:1006.3; IV Piggyback:389.6] ?Out: -  ? ?Vent settings for last 24 hours: ?  ? ?Physical Exam:  ?Gen: comfortable, no distress ?Neuro: non-focal exam ?HEENT: PERRL ?Neck: supple ?CV: RRR ?Pulm: unlabored breathing ?Abd: soft, appropriately TTP,  ?GU: clear yellow urine ?Extr: wwp, no edema ? ? ?No results found for this or any previous visit (from the past 24 hour(s)). ? ?Assessment & Plan: ?The plan of care was discussed with the bedside nurse for the day, who is in agreement with this plan and no additional concerns were raised.  ? ?Present on Admission: ? Neuroendocrine carcinoma ([place]) ? ? ? LOS: 3 days  ? ?Additional comments:I reviewed the patient's new clinical lab test results.   and I reviewed the patients new imaging test results.   ? ?Neuroendocrine tumor on colonoscopy - s/p exlap, R hemicolectomy, cholecystectomy 3/9. Await path.  ?Respiratory failure - send resp cx/ABG, IS/flutter, chest PT, duonebs. CT chest 3/11 with no PE, concern for PNA. Send CBC/BMP. Wean O2 as tol,okay for ventimask due to mouth breathing. ?vWD - humate P per Heme, to  end today ?FEN - reg diet, poor intake, add Boost, cont MIVF ?DVT - SCDs, s/p DDVAP, LMWH ?Foley - d/c 3/10, spont voids ?Dispo - 4NP. abdominal binder ? ? ?[name], MD ?Trauma & General Surgery ?Please use [email or web address] to contact on call  provider ? ?05/08/2021 ? ?*Care during the described time interval was provided by me. I have reviewed this patient's available data, including medical history, events of note, physical examination and test results as part of my evaluation. ? ?

## 2021-05-08 NOTE — Progress Notes (Addendum)
Worsening confusion/delirium noted this evening.  Noted to be saturating in the 90s on 20 L of high flow nasal cannula, afebrile, no tachycardia, mildly hypertensive.  ?Will recheck ABG, chest x-ray, labs and CT head.  Stop scheduled Robaxin.  She has only received 1 dose of oral Dilaudid today which was 930 this morning, and has had only 1 dose of IV Dilaudid today which was around 830 this evening.  Phenergan has not been administered today, but will take this off the [facility] as well given altered mental status. ? ?Addendum 9826: ABG essentially stable from this morning.  PaCO2 remains elevated at 48.9, PaO2 remains decreased 72.  CT head negative for acute intracranial abnormality; stable VP shunt..  Chest x-ray with bilateral opacities pneumonia versus ARDS.  Cefepime was started 24 hours ago with improvement in leukocytosis noted on most recent CBC.  Delirium likely related to ongoing respiratory failure.  Try BiPAP. ?

## 2021-05-08 NOTE — Progress Notes (Signed)
Notified Dr Kae Heller of increased confusion noted w/ assessment. Neuro assessment completed. Unable to state the current month or year. When assessing sensation she states that she feels sensation on the right side when touching the left side. Strength is equal BUE/BLE. Her husband is at bedside and states that he noticed confusion yesterday . She is now on HFNC 20 L/100% sats 92-95%. VS WNL. See new orders.  ?

## 2021-05-08 NOTE — Progress Notes (Signed)
Patient states that she needs to urinate but refuses to use the bedpan or purewick patient informed of the need to stay in the bed due to her poor respiratory status and high amount of oxygen needs. Will continue to monitor and encourage patient to use either the bedpan or purewick.  ?

## 2021-05-09 ENCOUNTER — Inpatient Hospital Stay (HOSPITAL_COMMUNITY): Payer: No Typology Code available for payment source

## 2021-05-09 DIAGNOSIS — R609 Edema, unspecified: Secondary | ICD-10-CM | POA: Diagnosis not present

## 2021-05-09 LAB — POCT I-STAT 7, (LYTES, BLD GAS, ICA,H+H)
Acid-Base Excess: 2 mmol/L (ref 0.0–2.0)
Bicarbonate: 27.6 mmol/L (ref 20.0–28.0)
Calcium, Ion: 1.16 mmol/L (ref 1.15–1.40)
HCT: 32 % — ABNORMAL LOW (ref 36.0–46.0)
Hemoglobin: 10.9 g/dL — ABNORMAL LOW (ref 12.0–15.0)
O2 Saturation: 93 %
Patient temperature: 98.8
Potassium: 3.2 mmol/L — ABNORMAL LOW (ref 3.5–5.1)
Sodium: 143 mmol/L (ref 135–145)
TCO2: 29 mmol/L (ref 22–32)
pCO2 arterial: 48.9 mmHg — ABNORMAL HIGH (ref 32–48)
pH, Arterial: 7.36 (ref 7.35–7.45)
pO2, Arterial: 72 mmHg — ABNORMAL LOW (ref 83–108)

## 2021-05-09 LAB — CBC
HCT: 31.3 % — ABNORMAL LOW (ref 36.0–46.0)
Hemoglobin: 10.3 g/dL — ABNORMAL LOW (ref 12.0–15.0)
MCH: 29.6 pg (ref 26.0–34.0)
MCHC: 32.9 g/dL (ref 30.0–36.0)
MCV: 89.9 fL (ref 80.0–100.0)
Platelets: 221 10*3/uL (ref 150–400)
RBC: 3.48 MIL/uL — ABNORMAL LOW (ref 3.87–5.11)
RDW: 14.4 % (ref 11.5–15.5)
WBC: 11.3 10*3/uL — ABNORMAL HIGH (ref 4.0–10.5)
nRBC: 0 % (ref 0.0–0.2)

## 2021-05-09 LAB — D-DIMER, QUANTITATIVE: D-Dimer, Quant: 2.82 ug/mL-FEU — ABNORMAL HIGH (ref 0.00–0.50)

## 2021-05-09 LAB — BASIC METABOLIC PANEL
Anion gap: 10 (ref 5–15)
BUN: 9 mg/dL (ref 6–20)
CO2: 27 mmol/L (ref 22–32)
Calcium: 8.2 mg/dL — ABNORMAL LOW (ref 8.9–10.3)
Chloride: 105 mmol/L (ref 98–111)
Creatinine, Ser: 0.85 mg/dL (ref 0.44–1.00)
GFR, Estimated: >60 mL/min (ref >60)
Glucose, Bld: 114 mg/dL — ABNORMAL HIGH (ref 70–99)
Potassium: 3.3 mmol/L — ABNORMAL LOW (ref 3.5–5.1)
Sodium: 142 mmol/L (ref 135–145)

## 2021-05-09 LAB — BLOOD GAS, ARTERIAL
Acid-Base Excess: 6.7 mmol/L — ABNORMAL HIGH (ref 0.0–2.0)
Bicarbonate: 33.3 mmol/L — ABNORMAL HIGH (ref 20.0–28.0)
Drawn by: 519031
O2 Saturation: 92.7 %
Patient temperature: 37
pCO2 arterial: 55 mmHg — ABNORMAL HIGH (ref 32–48)
pH, Arterial: 7.39 (ref 7.35–7.45)
pO2, Arterial: 60 mmHg — ABNORMAL LOW (ref 83–108)

## 2021-05-09 LAB — RESP PANEL BY RT-PCR (FLU A&B, COVID) ARPGX2
Influenza A by PCR: NEGATIVE
Influenza B by PCR: NEGATIVE
SARS Coronavirus 2 by RT PCR: NEGATIVE

## 2021-05-09 LAB — MAGNESIUM: Magnesium: 1.7 mg/dL (ref 1.7–2.4)

## 2021-05-09 LAB — GLOBAL TEG PANEL
CFF Max Amplitude: >52 mm — ABNORMAL HIGH (ref 15–32)
CK with Heparinase (R): 4.8 min (ref 4.3–8.3)
Citrated Kaolin (K): 0.8 min (ref 0.8–2.1)
Citrated Kaolin (MA): 71.6 mm — ABNORMAL HIGH (ref 52–69)
Citrated Kaolin (R): 4.9 min (ref 4.6–9.1)
Citrated Kaolin Angle: 79.4 deg — ABNORMAL HIGH (ref 63–78)
Citrated Rapid TEG (MA): 72.2 mm — ABNORMAL HIGH (ref 52–70)

## 2021-05-09 MED ORDER — FUROSEMIDE 10 MG/ML IJ SOLN
60.0000 mg | Freq: Once | INTRAMUSCULAR | Status: AC
Start: 2021-05-09 — End: 2021-05-09
  Administered 2021-05-09: 60 mg via INTRAVENOUS
  Filled 2021-05-09: qty 6

## 2021-05-09 MED ORDER — CHLORHEXIDINE GLUCONATE CLOTH 2 % EX PADS
6.0000 | MEDICATED_PAD | Freq: Every day | CUTANEOUS | Status: DC
  Administered 2021-05-09 – 2021-05-13 (×6): 6 via TOPICAL

## 2021-05-09 MED ORDER — PIPERACILLIN-TAZOBACTAM 3.375 G IVPB
3.3750 g | Freq: Three times a day (TID) | INTRAVENOUS | Status: AC
  Administered 2021-05-09 – 2021-05-14 (×15): 3.375 g via INTRAVENOUS
  Filled 2021-05-09 (×15): qty 50

## 2021-05-09 MED ORDER — FUROSEMIDE 10 MG/ML IJ SOLN
20.0000 mg | Freq: Once | INTRAMUSCULAR | Status: DC
  Filled 2021-05-09: qty 2

## 2021-05-09 MED ORDER — POTASSIUM CHLORIDE 10 MEQ/100ML IV SOLN
10.0000 meq | INTRAVENOUS | Status: AC
  Administered 2021-05-09 (×4): 10 meq via INTRAVENOUS
  Filled 2021-05-09 (×4): qty 100

## 2021-05-09 MED ORDER — POTASSIUM CHLORIDE 10 MEQ/100ML IV SOLN
10.0000 meq | INTRAVENOUS | Status: AC
  Administered 2021-05-09 – 2021-05-10 (×6): 10 meq via INTRAVENOUS
  Filled 2021-05-09 (×6): qty 100

## 2021-05-09 MED ORDER — MAGNESIUM SULFATE 2 GM/50ML IV SOLN
2.0000 g | Freq: Once | INTRAVENOUS | Status: AC
  Administered 2021-05-09: 2 g via INTRAVENOUS
  Filled 2021-05-09: qty 50

## 2021-05-09 MED ORDER — PIPERACILLIN-TAZOBACTAM 3.375 G IVPB 30 MIN
3.3750 g | Freq: Once | INTRAVENOUS | Status: AC
  Administered 2021-05-09: 3.375 g via INTRAVENOUS
  Filled 2021-05-09 (×2): qty 50

## 2021-05-09 MED ORDER — IOHEXOL 350 MG/ML SOLN
100.0000 mL | Freq: Once | INTRAVENOUS | Status: AC | PRN
  Administered 2021-05-09: 100 mL via INTRAVENOUS

## 2021-05-09 MED ORDER — FUROSEMIDE 10 MG/ML IJ SOLN
40.0000 mg | Freq: Once | INTRAMUSCULAR | Status: AC
  Administered 2021-05-10: 40 mg via INTRAVENOUS
  Filled 2021-05-09: qty 4

## 2021-05-09 MED ORDER — FUROSEMIDE 10 MG/ML IJ SOLN
20.0000 mg | Freq: Once | INTRAMUSCULAR | Status: AC
  Administered 2021-05-09: 20 mg via INTRAVENOUS
  Filled 2021-05-09: qty 2

## 2021-05-09 NOTE — Progress Notes (Signed)
Pt placed on BiPAP 14/8 60% due to increased work of breathing and sats dropping into the 80's. MD at bedside.  ?

## 2021-05-09 NOTE — Progress Notes (Signed)
RT NOTES: Pt refusing CPT and breathing treatment at this time.  ?

## 2021-05-09 NOTE — Progress Notes (Signed)
Patient on BiPAP.  CPT (flutter valve) held at this time.  RT will continue to monitor. ?

## 2021-05-09 NOTE — TOC Progression Note (Signed)
Transition of Care (TOC) - Progression Note  ? ? ?Patient Details  ?Name: Cassidy Tran ?MRN: [id number] ?Date of Birth: 1975-03-13 ? ?Transition of Care (TOC) CM/SW Contact  ?Angelita Ingles, RN ?Phone [phone] ? ?05/09/2021, 9:32 AM ? ?Clinical Narrative:    ? ?Transition of Care (TOC) Screening Note ? ? ?Patient Details  ?Name: Cassidy Tran ?Date of Birth: [birth year] ? ? ?Transition of Care (TOC) CM/SW Contact:    ?Angelita Ingles, RN ?Phone Number: ?05/09/2021, 9:33 AM ? ? ? ?Transition of Care Department [facility]) has reviewed patient and no TOC needs have been identified at this time. We will continue to monitor patient advancement through interdisciplinary progression rounds.  ? ? ? ? ?  ?  ? ?Expected Discharge Plan and Services ?  ?  ?  ?  ?  ?                ?  ?  ?  ?  ?  ?  ?  ?  ?  ?  ? ? ?Social Determinants of Health (SDOH) Interventions ?  ? ?Readmission Risk Interventions ?No flowsheet data found. ? ?

## 2021-05-09 NOTE — Progress Notes (Signed)
Lower extremity venous has been completed.  ? ?Preliminary results in CV Proc.  ? ?Courtne Lighty Yutaka Holberg ?05/09/2021 2:43 PM    ?

## 2021-05-09 NOTE — Progress Notes (Signed)
Pharmacy Antibiotic Note ? ?Cassidy Tran is a 46 y.o. female admitted on 05/05/2021 with pneumonia.  Pharmacy has been consulted for Zosyn dosing. Patient previously on cefepime 3/11-3/12. SCr improved to 0.85 today. ? ?Noted hx of "unspecified" reaction to PCN in chart but per discussion with Dr. Bobbye Morton, ok to give Zosyn and monitor. No issues reported so far with first dose. ? ?Plan: ?Zosyn 3.375g IV (102mn infusion) x1; then 3.375g IV q8h (4h infusion) ?Monitor clinical progress, c/s, renal function ?F/u de-escalation plan/LOT ? ? ?Height: '5\' 3"'$  (160 cm) ?Weight: 96.2 kg (212 lb 1.3 oz) ?IBW/kg (Calculated) : 52.4 ? ?Temp (24hrs), Avg:98.2 ?F (36.8 ?C), Min:97.6 ?F (36.4 ?C), Max:98.9 ?F (37.2 ?C) ? ?Recent Labs  ?Lab 05/02/21 ?0813 05/06/21 ?0390303/12/23 ?1014 05/09/21 ?0056  ?WBC 12.7* 27.5* 16.1* 11.3*  ?CREATININE 0.78 0.80 1.09* 0.85  ?  ?Estimated Creatinine Clearance: 90.3 mL/min (by C-G formula based on SCr of 0.85 mg/dL).   ? ?Allergies  ?Allergen Reactions  ? Aspirin Other (See Comments)  ?  Childhood reaction  ? Codeine Nausea And Vomiting  ?  Tolerates Hydrocodone  ? Erythromycin Other (See Comments)  ?  Childhood reaction, tolerate Zpak ?Childhood reaction does not know.  ? Lactose Intolerance (Gi) Diarrhea  ? Nsaids Nausea And Vomiting  ? Penicillins Other (See Comments)  ?  Has patient had a PCN reaction causing immediate rash, facial/tongue/throat swelling, SOB or lightheadedness with hypotension: doesn't remember childhood Reaction  ?Has patient had a PCN reaction causing severe rash involving mucus membranes or skin necrosis: NO ?Has patient had a PCN reaction that required hospitalization NO ?Has patient had a PCN reaction occurring within the last 10 years: NO ?If all of the above answers are "NO", then may proceed with Cephalosporin use. ?  ? ? ?HArturo Morton PharmD, BCPS ?Please check AMION for [place] ?Clinical Pharmacist ?05/09/2021 10:00 AM ? ? ?

## 2021-05-09 NOTE — Progress Notes (Signed)
? ?General Surgery Follow Up Note ? ?Subjective:  ?  ?Overnight Issues:  ? ?Objective:  ?Vital signs for last 24 hours: ?Temp:  [97.6 ?F (36.4 ?C)-98.9 ?F (37.2 ?C)] 97.6 ?F (36.4 ?C) (03/13 0938) ?Pulse Rate:  [79-101] 101 (03/13 0748) ?Resp:  [13-22] 22 (03/13 0748) ?BP: (111-167)/(52-66) 167/66 (03/13 0748) ?SpO2:  [92 %-100 %] 94 % (03/13 0748) ?FiO2 (%):  [90 %-100 %] 90 % (03/13 0742) ? ?Hemodynamic parameters for last 24 hours: ?  ? ?Intake/Output from previous day: ?03/12 0701 - 03/13 0700 ?In: 3067.4 [P.O.:540; I.V.:1937.8; IV Piggyback:589.6] ?Out: 300 [Urine:300]  ?Intake/Output this shift: ?No intake/output data recorded. ? ?Vent settings for last 24 hours: ?FiO2 (%):  [90 %-100 %] 90 % ? ?Physical Exam:  ?Gen: comfortable, no distress ?Neuro: non-focal exam, oriented ?HEENT: PERRL ?Neck: supple ?CV: RRR ?Pulm: unlabored breathing ?Abd: soft, appropriately TTP ?GU: clear yellow urine ?Extr: wwp, no edema ? ? ?Results for orders placed or performed during the hospital encounter of 05/05/21 (from the past 24 hour(s))  ?Basic metabolic panel     Status: Abnormal  ? Collection Time: 05/08/21 10:14 AM  ?Result Value Ref Range  ? Sodium 136 135 - 145 mmol/L  ? Potassium 3.5 3.5 - 5.1 mmol/L  ? Chloride 102 98 - 111 mmol/L  ? CO2 23 22 - 32 mmol/L  ? Glucose, Bld 99 70 - 99 mg/dL  ? BUN 16 6 - 20 mg/dL  ? Creatinine, Ser 1.09 (H) 0.44 - 1.00 mg/dL  ? Calcium 8.4 (L) 8.9 - 10.3 mg/dL  ? GFR, Estimated >60 >60 mL/min  ? Anion gap 11 5 - 15  ?CBC     Status: Abnormal  ? Collection Time: 05/08/21 10:14 AM  ?Result Value Ref Range  ? WBC 16.1 (H) 4.0 - 10.5 K/uL  ? RBC 3.64 (L) 3.87 - 5.11 MIL/uL  ? Hemoglobin 10.5 (L) 12.0 - 15.0 g/dL  ? HCT 33.1 (L) 36.0 - 46.0 %  ? MCV 90.9 80.0 - 100.0 fL  ? MCH 28.8 26.0 - 34.0 pg  ? MCHC 31.7 30.0 - 36.0 g/dL  ? RDW 14.2 11.5 - 15.5 %  ? Platelets 252 150 - 400 K/uL  ? nRBC 0.0 0.0 - 0.2 %  ?Blood gas, arterial     Status: Abnormal  ? Collection Time: 05/08/21  3:55 PM   ?Result Value Ref Range  ? pH, Arterial 7.37 7.35 - 7.45  ? pCO2 arterial 47 32 - 48 mmHg  ? pO2, Arterial 82 (L) 83 - 108 mmHg  ? Bicarbonate 27.7 20.0 - 28.0 mmol/L  ? Acid-Base Excess 1.7 0.0 - 2.0 mmol/L  ? O2 Saturation 96.7 %  ? Patient temperature 36.7   ? Collection site RIGHT BRACHIAL   ? Drawn by [id number]   ?I-STAT 7, (LYTES, BLD GAS, ICA, H+H)     Status: Abnormal  ? Collection Time: 05/09/21 12:21 AM  ?Result Value Ref Range  ? pH, Arterial 7.360 7.35 - 7.45  ? pCO2 arterial 48.9 (H) 32 - 48 mmHg  ? pO2, Arterial 72 (L) 83 - 108 mmHg  ? Bicarbonate 27.6 20.0 - 28.0 mmol/L  ? TCO2 29 22 - 32 mmol/L  ? O2 Saturation 93 %  ? Acid-Base Excess 2.0 0.0 - 2.0 mmol/L  ? Sodium 143 135 - 145 mmol/L  ? Potassium 3.2 (L) 3.5 - 5.1 mmol/L  ? Calcium, Ion 1.16 1.15 - 1.40 mmol/L  ? HCT 32.0 (L) 36.0 - 46.0 %  ?  Hemoglobin 10.9 (L) 12.0 - 15.0 g/dL  ? Patient temperature 98.8 F   ? Collection site RADIAL, ALLEN'S TEST ACCEPTABLE   ? Drawn by RT   ? Sample type ARTERIAL   ?CBC     Status: Abnormal  ? Collection Time: 05/09/21 12:56 AM  ?Result Value Ref Range  ? WBC 11.3 (H) 4.0 - 10.5 K/uL  ? RBC 3.48 (L) 3.87 - 5.11 MIL/uL  ? Hemoglobin 10.3 (L) 12.0 - 15.0 g/dL  ? HCT 31.3 (L) 36.0 - 46.0 %  ? MCV 89.9 80.0 - 100.0 fL  ? MCH 29.6 26.0 - 34.0 pg  ? MCHC 32.9 30.0 - 36.0 g/dL  ? RDW 14.4 11.5 - 15.5 %  ? Platelets 221 150 - 400 K/uL  ? nRBC 0.0 0.0 - 0.2 %  ?Basic metabolic panel     Status: Abnormal  ? Collection Time: 05/09/21 12:56 AM  ?Result Value Ref Range  ? Sodium 142 135 - 145 mmol/L  ? Potassium 3.3 (L) 3.5 - 5.1 mmol/L  ? Chloride 105 98 - 111 mmol/L  ? CO2 27 22 - 32 mmol/L  ? Glucose, Bld 114 (H) 70 - 99 mg/dL  ? BUN 9 6 - 20 mg/dL  ? Creatinine, Ser 0.85 0.44 - 1.00 mg/dL  ? Calcium 8.2 (L) 8.9 - 10.3 mg/dL  ? GFR, Estimated >60 >60 mL/min  ? Anion gap 10 5 - 15  ?Magnesium     Status: None  ? Collection Time: 05/09/21 12:56 AM  ?Result Value Ref Range  ? Magnesium 1.7 1.7 - 2.4 mg/dL  ?Resp Panel by  RT-PCR (Flu A&B, Covid) Nasopharyngeal Swab     Status: None  ? Collection Time: 05/09/21  1:15 AM  ? Specimen: Nasopharyngeal Swab; Nasopharyngeal(NP) swabs in vial transport medium  ?Result Value Ref Range  ? SARS Coronavirus 2 by RT PCR NEGATIVE NEGATIVE  ? Influenza A by PCR NEGATIVE NEGATIVE  ? Influenza B by PCR NEGATIVE NEGATIVE  ? ? ?Assessment & Plan: ?The plan of care was discussed with the bedside nurse for the day, who is in agreement with this plan and no additional concerns were raised.  ? ?Present on Admission: ? Neuroendocrine carcinoma ([place]) ? ? ? LOS: 4 days  ? ?Additional comments:I reviewed the patient's new clinical lab test results.   and I reviewed the patients new imaging test results.   ? ?Neuroendocrine tumor on colonoscopy - s/p exlap, R hemicolectomy, cholecystectomy 3/9. Await path.  ?Respiratory failure - on 30L HFNC at 90%, ABG mild hypercarbia. Cont IS/flutter, chest PT, duonebs. CT chest 3/11 with no PE, concern for PNA. Wean O2 as tol,okay for ventimask due to mouth breathing. ?Altered mental status - unclear etiology, suspect due to hypoxia vs sundowning. CT head 3/13 normal (h/o VP shunt). D/w RPh med review. Change cefepime to zosyn. ?vWD - humate P per Heme, end 3/12, send TEG ?FEN - reg diet, Boost, d/c MIVF, consider lasix later today ?DVT - SCDs, s/p DDVAP, LMWH ?Foley - d/c 3/10, spont voids ?Dispo - 4NP. abdominal binder ? ?[name], MD ?Trauma & General Surgery ?Please use [email or web address] to contact on call provider ? ?05/09/2021 ? ?*Care during the described time interval was provided by me. I have reviewed this patient's available data, including medical history, events of note, physical examination and test results as part of my evaluation. ? ?

## 2021-05-10 DIAGNOSIS — C7A8 Other malignant neuroendocrine tumors: Secondary | ICD-10-CM | POA: Diagnosis not present

## 2021-05-10 LAB — POCT I-STAT 7, (LYTES, BLD GAS, ICA,H+H)
Acid-Base Excess: 9 mmol/L — ABNORMAL HIGH (ref 0.0–2.0)
Acid-Base Excess: 11 mmol/L — ABNORMAL HIGH (ref 0.0–2.0)
Acid-Base Excess: 10 mmol/L — ABNORMAL HIGH (ref 0.0–2.0)
Bicarbonate: 35.2 mmol/L — ABNORMAL HIGH (ref 20.0–28.0)
Bicarbonate: 36.3 mmol/L — ABNORMAL HIGH (ref 20.0–28.0)
Bicarbonate: 35.1 mmol/L — ABNORMAL HIGH (ref 20.0–28.0)
Calcium, Ion: 1.07 mmol/L — ABNORMAL LOW (ref 1.15–1.40)
Calcium, Ion: 1.09 mmol/L — ABNORMAL LOW (ref 1.15–1.40)
Calcium, Ion: 1.16 mmol/L (ref 1.15–1.40)
HCT: 34 % — ABNORMAL LOW (ref 36.0–46.0)
HCT: 32 % — ABNORMAL LOW (ref 36.0–46.0)
HCT: 33 % — ABNORMAL LOW (ref 36.0–46.0)
Hemoglobin: 11.6 g/dL — ABNORMAL LOW (ref 12.0–15.0)
Hemoglobin: 10.9 g/dL — ABNORMAL LOW (ref 12.0–15.0)
Hemoglobin: 11.2 g/dL — ABNORMAL LOW (ref 12.0–15.0)
O2 Saturation: 94 %
O2 Saturation: 96 %
O2 Saturation: 92 %
Patient temperature: 99.1
Patient temperature: 97.5
Patient temperature: 98
Potassium: 3.2 mmol/L — ABNORMAL LOW (ref 3.5–5.1)
Potassium: 3.4 mmol/L — ABNORMAL LOW (ref 3.5–5.1)
Potassium: 3.3 mmol/L — ABNORMAL LOW (ref 3.5–5.1)
Sodium: 141 mmol/L (ref 135–145)
Sodium: 138 mmol/L (ref 135–145)
Sodium: 140 mmol/L (ref 135–145)
TCO2: 37 mmol/L — ABNORMAL HIGH (ref 22–32)
TCO2: 38 mmol/L — ABNORMAL HIGH (ref 22–32)
TCO2: 37 mmol/L — ABNORMAL HIGH (ref 22–32)
pCO2 arterial: 52.9 mmHg — ABNORMAL HIGH (ref 32–48)
pCO2 arterial: 50.2 mmHg — ABNORMAL HIGH (ref 32–48)
pCO2 arterial: 49 mmHg — ABNORMAL HIGH (ref 32–48)
pH, Arterial: 7.432 (ref 7.35–7.45)
pH, Arterial: 7.464 — ABNORMAL HIGH (ref 7.35–7.45)
pH, Arterial: 7.462 — ABNORMAL HIGH (ref 7.35–7.45)
pO2, Arterial: 72 mmHg — ABNORMAL LOW (ref 83–108)
pO2, Arterial: 79 mmHg — ABNORMAL LOW (ref 83–108)
pO2, Arterial: 61 mmHg — ABNORMAL LOW (ref 83–108)

## 2021-05-10 LAB — APTT: aPTT: 28 seconds (ref 24–36)

## 2021-05-10 LAB — URINE CULTURE: Culture: NO GROWTH

## 2021-05-10 LAB — FACTOR 8 ASSAY: Coagulation Factor VIII: 170 % — ABNORMAL HIGH (ref 56–140)

## 2021-05-10 LAB — SURGICAL PATHOLOGY

## 2021-05-10 LAB — MAGNESIUM: Magnesium: 2 mg/dL (ref 1.7–2.4)

## 2021-05-10 MED ORDER — SODIUM CHLORIDE 0.9 % IV SOLN: INTRAVENOUS | Status: DC | PRN

## 2021-05-10 MED ORDER — POTASSIUM CHLORIDE 20 MEQ PO PACK
40.0000 meq | PACK | Freq: Once | ORAL | Status: AC
  Administered 2021-05-10: 40 meq via ORAL
  Filled 2021-05-10: qty 2

## 2021-05-10 MED ORDER — SODIUM CHLORIDE 0.9 % IV SOLN
250.0000 mL | INTRAVENOUS | Status: DC
Start: 2021-05-10 — End: 2021-05-23

## 2021-05-10 MED ORDER — FUROSEMIDE 10 MG/ML IJ SOLN
6.0000 mg/h | INTRAVENOUS | Status: DC
  Administered 2021-05-10: 4 mg/h via INTRAVENOUS
  Administered 2021-05-11: 6 mg/h via INTRAVENOUS
  Filled 2021-05-10 (×3): qty 20

## 2021-05-10 MED ORDER — ACETAZOLAMIDE SODIUM 500 MG IJ SOLR
500.0000 mg | Freq: Once | INTRAMUSCULAR | Status: AC
  Administered 2021-05-10: 500 mg via INTRAVENOUS
  Filled 2021-05-10: qty 500

## 2021-05-10 MED ORDER — NOREPINEPHRINE 4 MG/250ML-% IV SOLN: 2.0000 ug/min | INTRAVENOUS | Status: DC

## 2021-05-10 MED ORDER — CALCIUM GLUCONATE-NACL 2-0.675 GM/100ML-% IV SOLN
2.0000 g | Freq: Once | INTRAVENOUS | Status: AC
  Administered 2021-05-10: 2000 mg via INTRAVENOUS
  Filled 2021-05-10 (×2): qty 100

## 2021-05-10 MED FILL — Antihemophilic Factor/VWF (Human) For Inj 1000-2400 Unit: INTRAVENOUS | Qty: 3724 | Status: AC

## 2021-05-10 NOTE — Progress Notes (Signed)
An USGPIV (ultrasound guided PIV) has been placed for short-term vasopressor infusion. A correctly placed ivWatch must be used when administering Vasopressors. Should this treatment be needed beyond 72 hours, central line access should be obtained.  It will be the responsibility of the bedside nurse to follow best practice to prevent extravasations.   ?

## 2021-05-10 NOTE — Progress Notes (Signed)
Initial Nutrition Assessment ? ?DOCUMENTATION CODES:  ? ?Obesity unspecified ? ?INTERVENTION:  ?- Initiate tube feeding via Cortrak once placement confirmed by xray: ?Start Osmolite 1.5 at 25 ml/h and advance by 33m every 8 hours until goal rate of 571mhr (1320 ml per day) ?Prosource TF 45 ml TID ? ?Provides 2100 kcal, 108 gm protein, 1006 ml free water daily ? ?- Monitor magnesium, potassium, and phosphorus BID for at least 3 days, MD to replete as needed, as pt is at risk for refeeding syndrome given several days of inadequate PO intake ? ?NUTRITION DIAGNOSIS:  ? ?Increased nutrient needs related to acute illness as evidenced by estimated needs. ? ?GOAL:  ? ?Patient will meet greater than or equal to 90% of their needs ? ?MONITOR:  ? ?Diet advancement, Labs, Weight trends, TF tolerance, I & O's ? ?REASON FOR ASSESSMENT:  ? ?Other (Comment) (Cortrak) ?  ? ?ASSESSMENT:  ? 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 PMH significant for type IA von Willebrand disease. ? ?3/9: exlap, cholecystectomy, R hemicolectomy; awaiting pathology ?3/13: transferred back to  neuro ICU d/t AMS secondary to hypoxia, respiratory failure and volume overload requiring BiPAP, now placed on 25L HHFNC at 100% d/t nausea ? ?Pt's family member at bedside provided most history as pt with abdominal pain and nausea. He reports very minimal PO intake since last Tuesday given pt on restricted diet and NPO most of admission. She did not like the Boost Breeze she was previously ordered. PTA she was eating 3 meals per day but did not provide details on foods eaten other than stating she eats "junk." Pt states she is feeling hungry. Discussed starting tube feeding tomorrow and pt is agreeable and ready for initiation of tube feeds. All current questions answered. ? ?Surgery made pt NPO with order for Cortrak placement tomorrow. Spoke with RN as there is no CoGafferoday. Plans to place Cortrak tomorrow and will place  tube feeding recommendations. ? ?Per review of chart, pt weight appear stable PTA. Current weight noted to be 96.2 lbs. Pt also noted to be volume overload, requiring lasix. Will continue to monitor throughout admission. ? ?Medications: colace, imodium, protonix, KLOR-CON ?IV drips: lasix in D5, levophed, zosyn ? ?Labs: potassium 3.4, ionized calcium 1.09 ? ?UOP: 3.3L x24 hours ?I/O's: +7.6L since admission ? ?NUTRITION - FOCUSED PHYSICAL EXAM: ? ?Flowsheet Row Most Recent Value  ?Orbital Region No depletion  ?Upper Arm Region No depletion  ?Thoracic and Lumbar Region No depletion  ?Buccal Region No depletion  ?Temple Region No depletion  ?Clavicle Bone Region No depletion  ?Clavicle and Acromion Bone Region No depletion  ?Scapular Bone Region No depletion  ?Dorsal Hand No depletion  ?Patellar Region No depletion  ?Anterior Thigh Region No depletion  ?Posterior Calf Region No depletion  ?Edema (RD Assessment) None  ?Hair Reviewed  ?Eyes Reviewed  ?Mouth Reviewed  ?Skin Reviewed  ?Nails Reviewed  ? ?  ? ? ?Diet Order:   ?Diet Order   ? ?       ?  Diet NPO time specified Except for: Sips with Meds, Ice Chips  Diet effective now       ?  ? ?  ?  ? ?  ? ? ?EDUCATION NEEDS:  ? ?Education needs have been addressed ? ?Skin:  Skin Assessment: Skin Integrity Issues: ?Skin Integrity Issues:: Incisions ?Incisions: abdomen (closed) ? ?Last BM:  3/13 (type 6) ? ?Height:  ? ?Ht Readings from Last 1  Encounters:  ?05/05/21 '5\' 3"'$  (1.6 m)  ? ? ?Weight:  ? ?Wt Readings from Last 1 Encounters:  ?05/07/21 96.2 kg  ? ? ?Ideal Body Weight:  52.3 kg ? ?BMI:  Body mass index is 37.57 kg/m?. ? ?Estimated Nutritional Needs:  ? ?Kcal:  2000-2200 ? ?Protein:  100-115g ? ?Fluid:  >/=2L ? ?Clayborne Dana, RDN, LDN ?Clinical Nutrition ?

## 2021-05-10 NOTE — Progress Notes (Signed)
RT attempted x2 to get ABG unsuccessful. MD will be made aware. ?

## 2021-05-10 NOTE — Progress Notes (Signed)
Two RT's attempted to place Aline x's4. Placement was unsuccessful. MD was made aware. ?

## 2021-05-10 NOTE — Progress Notes (Signed)
RT holding off on CPT at this time due to pt sleeping. RT will resume at next schedule therapy. ?

## 2021-05-10 NOTE — Progress Notes (Signed)
Patient complains of nausea.  Out of precaution RT took patient off of BiPAP and placed on HHFNC.  Patient placed on 25L/100%.  Patient vitals stable.  RT will continue to monitor. ?

## 2021-05-10 NOTE — Progress Notes (Signed)
Patient sitting up in bed gagging.  RT brought patient emesis bag.  CPT with held at this time. Rt will continue to monitor. ?

## 2021-05-10 NOTE — Progress Notes (Signed)
Unfortunately, she is now down in the neuro ICU.  She got sent down yesterday.  She apparently had problems with fluid overload over the weekend.  She has been diuresed.  She was never intubated which is certainly a blessing. ? ?She had a CT angiogram of the chest yesterday.  This was because of a elevated D-dimer.  The CT scan did not show any evidence of pulmonary embolism.  However, it looks like she may have had interstitial changes that seem to suggest fluid overload. ? ?There has been no bleeding.  She had a CT of the brain which was unremarkable. ? ?Her PTT today is 28 seconds.  I do not see that we have to give her any additional factor VIII replacement.  She got this over the weekend. ? ?She has confusion over the weekend because of some hypoxia. ? ?Hopefully, she will to eat a little bit more today.   ? ?Her oxygen saturation is 94%. ? ?Vital signs are all stable.  Temperature 98.4.  Pulse 84.  Blood pressure 123/62.  Head neck exam shows no ocular or oral lesions.  Lungs sound clear bilaterally.  She has good air movement bilaterally.  I do not hear any wheezes.  Cardiac exam regular rate and rhythm.  Abdomen shows a healing laparotomy scar.  There is no bleeding.  There is some slight clear fluid from the incision.  Extremity shows no clubbing, cyanosis or edema. ? ?Cassidy Tran recently have the laparotomy.  Pathology is not back yet.  She got factor VIII replacement.  She is not bleeding.  Her PTT is okay.  But she developed what looks like congestive heart failure.  She had a very good diuresis yesterday of almost 3300 cc of urine. ? ?Hopefully, she will be able to continue to improve.  Thankfully, there is no bleeding.  There is no thrombotic issues. ? ?We will continue to follow along and help out as needed. ? ?Lattie Haw, MD ? ?Psalms 37:24 ? ? ? ?

## 2021-05-10 NOTE — Progress Notes (Signed)
? ?General Surgery Follow Up Note ? ?Subjective:  ?  ?Overnight Issues:  ? ?Objective:  ?Vital signs for last 24 hours: ?Temp:  [98.3 ?F (36.8 ?C)-99.1 ?F (37.3 ?C)] 98.4 ?F (36.9 ?C) (03/14 0400) ?Pulse Rate:  [70-110] 76 (03/14 0700) ?Resp:  [11-26] 19 (03/14 0700) ?BP: (95-147)/(45-65) 122/62 (03/14 0700) ?SpO2:  [90 %-100 %] 98 % (03/14 0700) ?FiO2 (%):  [60 %-100 %] 100 % (03/14 0314) ? ?Hemodynamic parameters for last 24 hours: ?  ? ?Intake/Output from previous day: ?03/13 0701 - 03/14 0700 ?In: 2448.7 [P.O.:120; IV Piggyback:1128.7] ?Out: 3295 [[id number]]  ?Intake/Output this shift: ?No intake/output data recorded. ? ?Vent settings for last 24 hours: ?FiO2 (%):  [60 %-100 %] 100 % ? ?Physical Exam:  ?Gen: comfortable, no distress ?Neuro: non-focal exam ?HEENT: PERRL ?Neck: supple ?CV: RRR ?Pulm: unlabored breathing ?Abd: soft, appropriately TTP, midline incision c/d/i ?GU: clear yellow urine, foley ?Extr: wwp, no edema ? ? ?Results for orders placed or performed during the hospital encounter of 05/05/21 (from the past 24 hour(s))  ?Global TEG Panel     Status: Abnormal  ? Collection Time: 05/09/21 10:50 AM  ?Result Value Ref Range  ? Citrated Kaolin (R) 4.9 4.6 - 9.1 min  ? Citrated Kaolin (K) 0.8 0.8 - 2.1 min  ? Citrated Kaolin Angle 79.4 (H) 63 - 78 deg  ? Citrated Kaolin (MA) 71.6 (H) 52 - 69 mm  ? Citrated Rapid TEG (MA) 72.2 (H) 52 - 70 mm  ? CK with Heparinase (R) 4.8 4.3 - 8.3 min  ? CFF Max Amplitude >52 (H) 15 - 32 mm  ? Citrated Functional Fibrinogen NOT CALCULATED 278 - 581 mg/dL  ?D-dimer, quantitative     Status: Abnormal  ? Collection Time: 05/09/21  1:38 PM  ?Result Value Ref Range  ? D-Dimer, Quant 2.82 (H) 0.00 - 0.50 ug/mL-FEU  ?Blood gas, arterial     Status: Abnormal  ? Collection Time: 05/09/21  2:28 PM  ?Result Value Ref Range  ? pH, Arterial 7.39 7.35 - 7.45  ? pCO2 arterial 55 (H) 32 - 48 mmHg  ? pO2, Arterial 60 (L) 83 - 108 mmHg  ? Bicarbonate 33.3 (H) 20.0 - 28.0 mmol/L  ?  Acid-Base Excess 6.7 (H) 0.0 - 2.0 mmol/L  ? O2 Saturation 92.7 %  ? Patient temperature 37.0   ? Collection site REVIEWED BY   ? Drawn by [id number]   ? Allens test (pass/fail) PASS PASS  ?I-STAT 7, (LYTES, BLD GAS, ICA, H+H)     Status: Abnormal  ? Collection Time: 05/10/21  4:18 AM  ?Result Value Ref Range  ? pH, Arterial 7.432 7.35 - 7.45  ? pCO2 arterial 52.9 (H) 32 - 48 mmHg  ? pO2, Arterial 72 (L) 83 - 108 mmHg  ? Bicarbonate 35.2 (H) 20.0 - 28.0 mmol/L  ? TCO2 37 (H) 22 - 32 mmol/L  ? O2 Saturation 94 %  ? Acid-Base Excess 9.0 (H) 0.0 - 2.0 mmol/L  ? Sodium 141 135 - 145 mmol/L  ? Potassium 3.2 (L) 3.5 - 5.1 mmol/L  ? Calcium, Ion 1.07 (L) 1.15 - 1.40 mmol/L  ? HCT 34.0 (L) 36.0 - 46.0 %  ? Hemoglobin 11.6 (L) 12.0 - 15.0 g/dL  ? Patient temperature 99.1 F   ? Collection site RADIAL, ALLEN'S TEST ACCEPTABLE   ? Drawn by HIDE   ? Sample type ARTERIAL   ?APTT     Status: None  ? Collection Time: 05/10/21  5:22 AM  ?Result Value Ref Range  ? aPTT 28 24 - 36 seconds  ? ? ?Assessment & Plan: ?The plan of care was discussed with the bedside nurse for the day, who is in agreement with this plan and no additional concerns were raised.  ? ?Present on Admission: ? Neuroendocrine carcinoma ([place]) ? ? ? LOS: 5 days  ? ?Additional comments:I reviewed the patient's new clinical lab test results.   and I reviewed the patients new imaging test results.   ? ?Neuroendocrine tumor on colonoscopy - s/p exlap, R hemicolectomy, cholecystectomy 3/9. Await path.  ?Respiratory failure 2/2 volume overload - on 25L HFNC at 100%, ABG mildly improved this AM, recheck later today. Cont IS/flutter, chest PT, duonebs. CT chest 3/11 and 3/13 with no PE. Wean O2 as tol,okay for ventimask due to mouth breathing. ?Volume overload - good response to IV lasix yest, but still with high O2 req'ts. Intermittent borderline hypotension. Switch to lasix gtt to avoid swings in volume shift. Peripheral levophed to augment BP to aid in volume  removal. ?Altered mental status - suspect due to hypoxia + volume overload. CT head 3/13 normal (h/o VP shunt). Changed cefepime to zosyn 3/13, total 7d course, empiric. ?vWD - humate P per Heme, end 3/12, hypercoagulable on TEG 3/13 ?FEN - NPO except sips/chips, place cortrak, Boost, replete hypokalemia ?DVT - SCDs, s/p DDVAP, LMWH ?Foley - replaced 3/13, strict I/O ?Dispo - ICU ?  ?Critical Care Total Time: 35 minutes ? ? ?[name], MD ?Trauma & General Surgery ?Please use [email or web address] to contact on call provider ? ?05/10/2021 ? ?*Care during the described time interval was provided by me. I have reviewed this patient's available data, including medical history, events of note, physical examination and test results as part of my evaluation. ? ?

## 2021-05-11 ENCOUNTER — Inpatient Hospital Stay (HOSPITAL_COMMUNITY): Payer: No Typology Code available for payment source

## 2021-05-11 LAB — GLUCOSE, CAPILLARY
Glucose-Capillary: 95 mg/dL (ref 70–99)
Glucose-Capillary: 98 mg/dL (ref 70–99)

## 2021-05-11 LAB — POCT I-STAT 7, (LYTES, BLD GAS, ICA,H+H)
Acid-Base Excess: 9 mmol/L — ABNORMAL HIGH (ref 0.0–2.0)
Bicarbonate: 34.7 mmol/L — ABNORMAL HIGH (ref 20.0–28.0)
Calcium, Ion: 1.03 mmol/L — ABNORMAL LOW (ref 1.15–1.40)
HCT: 32 % — ABNORMAL LOW (ref 36.0–46.0)
Hemoglobin: 10.9 g/dL — ABNORMAL LOW (ref 12.0–15.0)
O2 Saturation: 98 %
Patient temperature: 98.8
Potassium: 3.3 mmol/L — ABNORMAL LOW (ref 3.5–5.1)
Sodium: 139 mmol/L (ref 135–145)
TCO2: 36 mmol/L — ABNORMAL HIGH (ref 22–32)
pCO2 arterial: 50 mmHg — ABNORMAL HIGH (ref 32–48)
pH, Arterial: 7.451 — ABNORMAL HIGH (ref 7.35–7.45)
pO2, Arterial: 98 mmHg (ref 83–108)

## 2021-05-11 LAB — PHOSPHORUS: Phosphorus: 4.5 mg/dL (ref 2.5–4.6)

## 2021-05-11 LAB — CBC
HCT: 36 % (ref 36.0–46.0)
Hemoglobin: 11.8 g/dL — ABNORMAL LOW (ref 12.0–15.0)
MCH: 29.1 pg (ref 26.0–34.0)
MCHC: 32.8 g/dL (ref 30.0–36.0)
MCV: 88.9 fL (ref 80.0–100.0)
Platelets: 286 10*3/uL (ref 150–400)
RBC: 4.05 MIL/uL (ref 3.87–5.11)
RDW: 14.4 % (ref 11.5–15.5)
WBC: 15.7 10*3/uL — ABNORMAL HIGH (ref 4.0–10.5)
nRBC: 0 % (ref 0.0–0.2)

## 2021-05-11 LAB — BASIC METABOLIC PANEL
Anion gap: 17 — ABNORMAL HIGH (ref 5–15)
Anion gap: 16 — ABNORMAL HIGH (ref 5–15)
BUN: 10 mg/dL (ref 6–20)
BUN: 14 mg/dL (ref 6–20)
CO2: 31 mmol/L (ref 22–32)
CO2: 30 mmol/L (ref 22–32)
Calcium: 9 mg/dL (ref 8.9–10.3)
Calcium: 8.6 mg/dL — ABNORMAL LOW (ref 8.9–10.3)
Chloride: 93 mmol/L — ABNORMAL LOW (ref 98–111)
Chloride: 95 mmol/L — ABNORMAL LOW (ref 98–111)
Creatinine, Ser: 0.94 mg/dL (ref 0.44–1.00)
Creatinine, Ser: 1.03 mg/dL — ABNORMAL HIGH (ref 0.44–1.00)
GFR, Estimated: >60 mL/min (ref >60)
GFR, Estimated: >60 mL/min (ref >60)
Glucose, Bld: 80 mg/dL (ref 70–99)
Glucose, Bld: 102 mg/dL — ABNORMAL HIGH (ref 70–99)
Potassium: 3.4 mmol/L — ABNORMAL LOW (ref 3.5–5.1)
Potassium: 3.6 mmol/L (ref 3.5–5.1)
Sodium: 141 mmol/L (ref 135–145)
Sodium: 141 mmol/L (ref 135–145)

## 2021-05-11 LAB — FACTOR 8 ASSAY
Coagulation Factor VIII: 367 % — ABNORMAL HIGH (ref 56–140)
Coagulation Factor VIII: 370 % — ABNORMAL HIGH (ref 56–140)

## 2021-05-11 LAB — MAGNESIUM: Magnesium: 2.1 mg/dL (ref 1.7–2.4)

## 2021-05-11 MED ORDER — GERHARDT'S BUTT CREAM
TOPICAL_CREAM | CUTANEOUS | Status: DC | PRN
  Administered 2021-05-15 – 2021-06-03 (×8): 1 via TOPICAL
  Administered 2021-06-10: 2 via TOPICAL
  Filled 2021-05-11 (×5): qty 1

## 2021-05-11 MED ORDER — HYDROMORPHONE HCL 2 MG PO TABS: 1.0000 mg | ORAL_TABLET | ORAL | Status: DC | PRN

## 2021-05-11 MED ORDER — VITAL HIGH PROTEIN PO LIQD
1000.0000 mL | ORAL | Status: DC
  Administered 2021-05-11: 1000 mL

## 2021-05-11 MED ORDER — MONTELUKAST SODIUM 10 MG PO TABS
10.0000 mg | ORAL_TABLET | Freq: Every day | ORAL | Status: DC
  Administered 2021-05-12 – 2021-06-14 (×34): 10 mg
  Filled 2021-05-11 (×36): qty 1

## 2021-05-11 MED ORDER — OSMOLITE 1.5 CAL PO LIQD
1000.0000 mL | ORAL | Status: DC
  Administered 2021-05-11 – 2021-05-13 (×3): 1000 mL

## 2021-05-11 MED ORDER — AMLODIPINE BESYLATE 10 MG PO TABS
10.0000 mg | ORAL_TABLET | Freq: Every day | ORAL | Status: DC
  Filled 2021-05-11: qty 1

## 2021-05-11 MED ORDER — ATORVASTATIN CALCIUM 10 MG PO TABS
20.0000 mg | ORAL_TABLET | Freq: Every day | ORAL | Status: DC
  Administered 2021-05-12 – 2021-06-15 (×34): 20 mg
  Filled 2021-05-11 (×35): qty 2

## 2021-05-11 MED ORDER — CALCIUM GLUCONATE-NACL 1-0.675 GM/50ML-% IV SOLN
1.0000 g | Freq: Once | INTRAVENOUS | Status: AC
  Administered 2021-05-11: 1000 mg via INTRAVENOUS
  Filled 2021-05-11: qty 50

## 2021-05-11 MED ORDER — ACETAZOLAMIDE SODIUM 500 MG IJ SOLR
500.0000 mg | Freq: Once | INTRAMUSCULAR | Status: AC
  Administered 2021-05-11: 500 mg via INTRAVENOUS
  Filled 2021-05-11: qty 500

## 2021-05-11 MED ORDER — POTASSIUM CHLORIDE 20 MEQ PO PACK
40.0000 meq | PACK | ORAL | Status: AC
  Administered 2021-05-11: 40 meq
  Filled 2021-05-11 (×2): qty 2

## 2021-05-11 MED ORDER — PROSOURCE TF PO LIQD
45.0000 mL | Freq: Three times a day (TID) | ORAL | Status: DC
Start: 2021-05-11 — End: 2021-05-16
  Administered 2021-05-12 – 2021-05-15 (×12): 45 mL
  Filled 2021-05-11 (×11): qty 45

## 2021-05-11 MED ORDER — ACETAMINOPHEN 500 MG PO TABS
1000.0000 mg | ORAL_TABLET | Freq: Four times a day (QID) | ORAL | Status: DC
  Administered 2021-05-11 – 2021-06-15 (×124): 1000 mg
  Filled 2021-05-11 (×130): qty 2

## 2021-05-11 MED ORDER — PROSOURCE TF PO LIQD: 45.0000 mL | Freq: Two times a day (BID) | ORAL | Status: DC

## 2021-05-11 MED ORDER — LOPERAMIDE HCL 1 MG/7.5ML PO SUSP
2.0000 mg | Freq: Three times a day (TID) | ORAL | Status: DC
  Administered 2021-05-11: 2 mg
  Filled 2021-05-11: qty 15

## 2021-05-11 MED ORDER — MECLIZINE HCL 25 MG PO TABS
25.0000 mg | ORAL_TABLET | Freq: Three times a day (TID) | ORAL | Status: DC | PRN
  Filled 2021-05-11: qty 1

## 2021-05-11 MED ORDER — DOCUSATE SODIUM 50 MG/5ML PO LIQD
100.0000 mg | Freq: Two times a day (BID) | ORAL | Status: DC
  Administered 2021-05-11: 100 mg
  Filled 2021-05-11 (×2): qty 10

## 2021-05-11 MED ORDER — PANTOPRAZOLE 2 MG/ML SUSPENSION
40.0000 mg | Freq: Every day | ORAL | Status: DC
  Administered 2021-05-11 – 2021-06-02 (×22): 40 mg
  Filled 2021-05-11 (×22): qty 20

## 2021-05-11 MED ORDER — IRBESARTAN 150 MG PO TABS
300.0000 mg | ORAL_TABLET | Freq: Every day | ORAL | Status: DC
  Filled 2021-05-11: qty 2

## 2021-05-11 MED ORDER — VALPROIC ACID 250 MG/5ML PO SOLN
375.0000 mg | Freq: Four times a day (QID) | ORAL | Status: DC
  Administered 2021-05-11: 375 mg
  Filled 2021-05-11 (×2): qty 10

## 2021-05-11 NOTE — Progress Notes (Signed)
? ?General Surgery Follow Up Note ? ?Subjective:  ?  ?Overnight Issues:  ? ?Objective:  ?Vital signs for last 24 hours: ?Temp:  [98.2 ?F (36.8 ?C)-99.3 ?F (37.4 ?C)] 98.2 ?F (36.8 ?C) (03/15 1200) ?Pulse Rate:  [68-98] 87 (03/15 1400) ?Resp:  [13-37] 13 (03/15 1400) ?BP: (100-148)/(39-67) 131/52 (03/15 1400) ?SpO2:  [90 %-100 %] 94 % (03/15 1400) ?Arterial Line BP: (150-151)/(68-69) 151/68 (03/15 1400) ?FiO2 (%):  [50 %-100 %] 50 % (03/15 1352) ? ?Hemodynamic parameters for last 24 hours: ?  ? ?Intake/Output from previous day: ?03/14 0701 - 03/15 0700 ?In: 306.8 [I.V.:48.1; IV Piggyback:258.7] ?Out: 4080 [Urine:4080]  ?Intake/Output this shift: ?Total I/O ?In: 58.4 [I.V.:16.8; IV Piggyback:41.6] ?Out: 880 [Urine:880] ? ?Vent settings for last 24 hours: ?FiO2 (%):  [50 %-100 %] 50 % ? ?Physical Exam:  ?Gen: comfortable, no distress, very sleepy ?Neuro: non-focal exam, f/c ?HEENT: PERRL ?Neck: supple ?CV: RRR ?Pulm: unlabored breathing on HHFNC ?Abd: soft, appropriately TTP, incision c/d/i ?GU: clear yellow urine, foley ?Extr: wwp, no edema ? ? ?Results for orders placed or performed during the hospital encounter of 05/05/21 (from the past 24 hour(s))  ?I-STAT 7, (LYTES, BLD GAS, ICA, H+H)     Status: Abnormal  ? Collection Time: 05/10/21  5:17 PM  ?Result Value Ref Range  ? pH, Arterial 7.462 (H) 7.35 - 7.45  ? pCO2 arterial 49.0 (H) 32 - 48 mmHg  ? pO2, Arterial 61 (L) 83 - 108 mmHg  ? Bicarbonate 35.1 (H) 20.0 - 28.0 mmol/L  ? TCO2 37 (H) 22 - 32 mmol/L  ? O2 Saturation 92 %  ? Acid-Base Excess 10.0 (H) 0.0 - 2.0 mmol/L  ? Sodium 140 135 - 145 mmol/L  ? Potassium 3.3 (L) 3.5 - 5.1 mmol/L  ? Calcium, Ion 1.16 1.15 - 1.40 mmol/L  ? HCT 33.0 (L) 36.0 - 46.0 %  ? Hemoglobin 11.2 (L) 12.0 - 15.0 g/dL  ? Patient temperature 98.0 F   ? Collection site RADIAL, ALLEN'S TEST ACCEPTABLE   ? Drawn by RT   ? Sample type ARTERIAL   ?CBC     Status: Abnormal  ? Collection Time: 05/11/21  3:33 AM  ?Result Value Ref Range  ? WBC  15.7 (H) 4.0 - 10.5 K/uL  ? RBC 4.05 3.87 - 5.11 MIL/uL  ? Hemoglobin 11.8 (L) 12.0 - 15.0 g/dL  ? HCT 36.0 36.0 - 46.0 %  ? MCV 88.9 80.0 - 100.0 fL  ? MCH 29.1 26.0 - 34.0 pg  ? MCHC 32.8 30.0 - 36.0 g/dL  ? RDW 14.4 11.5 - 15.5 %  ? Platelets 286 150 - 400 K/uL  ? nRBC 0.0 0.0 - 0.2 %  ?Basic metabolic panel     Status: Abnormal  ? Collection Time: 05/11/21  3:33 AM  ?Result Value Ref Range  ? Sodium 141 135 - 145 mmol/L  ? Potassium 3.4 (L) 3.5 - 5.1 mmol/L  ? Chloride 93 (L) 98 - 111 mmol/L  ? CO2 31 22 - 32 mmol/L  ? Glucose, Bld 80 70 - 99 mg/dL  ? BUN 10 6 - 20 mg/dL  ? Creatinine, Ser 0.94 0.44 - 1.00 mg/dL  ? Calcium 9.0 8.9 - 10.3 mg/dL  ? GFR, Estimated >60 >60 mL/min  ? Anion gap 17 (H) 5 - 15  ?I-STAT 7, (LYTES, BLD GAS, ICA, H+H)     Status: Abnormal  ? Collection Time: 05/11/21  1:22 PM  ?Result Value Ref Range  ? pH,  Arterial 7.451 (H) 7.35 - 7.45  ? pCO2 arterial 50.0 (H) 32 - 48 mmHg  ? pO2, Arterial 98 83 - 108 mmHg  ? Bicarbonate 34.7 (H) 20.0 - 28.0 mmol/L  ? TCO2 36 (H) 22 - 32 mmol/L  ? O2 Saturation 98 %  ? Acid-Base Excess 9.0 (H) 0.0 - 2.0 mmol/L  ? Sodium 139 135 - 145 mmol/L  ? Potassium 3.3 (L) 3.5 - 5.1 mmol/L  ? Calcium, Ion 1.03 (L) 1.15 - 1.40 mmol/L  ? HCT 32.0 (L) 36.0 - 46.0 %  ? Hemoglobin 10.9 (L) 12.0 - 15.0 g/dL  ? Patient temperature 98.8 F   ? Collection site RADIAL, ALLEN'S TEST ACCEPTABLE   ? Drawn by RT   ? Sample type ARTERIAL   ? ? ?Assessment & Plan: ?The plan of care was discussed with the bedside nurse for the day, who is in agreement with this plan and no additional concerns were raised.  ? ?Present on Admission: ? Neuroendocrine carcinoma ([place]) ? ? ? LOS: 6 days  ? ?Additional comments:I reviewed the patient's new clinical lab test results.   and I reviewed the patients new imaging test results.   ? ?Neuroendocrine tumor on colonoscopy - s/p exlap, R hemicolectomy, cholecystectomy 3/9. Well differentiated neuroendocrine tumor with 1/7 +nodes. D/w Dr. Marin Olp will  likely need adjuvant chemo. ?Respiratory failure 2/2 volume overload - on 25L HFNC at 100%, art line placement, ABG and CXR improved today. Cont IS/flutter, chest PT, duonebs. CT chest 3/11 and 3/13 with no PE. Wean O2 as tol. ?Volume overload - good response to lasix gtt, but UOP dropped a bit o/n, incr lasix to 6/h, still with high O2 req'ts. Intermittent borderline hypotension.  ?Altered mental status - suspect due to hypoxia + volume overload. CT head 3/13 normal (h/o VP shunt). Changed cefepime to zosyn 3/13, total 7d course, empiric coverage for PNA. Resp cx if possible, ucx 3/13. D/w NSGY the utility of CSF infectious w/u, plan to hold off for now per Dr. Saintclair Halsted ?vWD - humate P per Heme, end 3/12, hypercoagulable on TEG 3/13 ?FEN - NPO except sips/chips, cortrak and TF today, replete hypokalemia ?DVT - SCDs, s/p DDVAP, LMWH, LE duplex neg for DVT 3/13 ?Foley - replaced 3/13, strict I/O ?Dispo - ICU ?  ? ?Critical Care Total Time: 45 minutes ? ?[name], MD ?Trauma & General Surgery ?Please use [email or web address] to contact on call provider ? ?05/11/2021 ? ?*Care during the described time interval was provided by me. I have reviewed this patient's available data, including medical history, events of note, physical examination and test results as part of my evaluation. ? ?

## 2021-05-11 NOTE — Progress Notes (Signed)
Patient unable to take per tube medications due to loss of cortrak. Patient is not as awake as yesterday and I am uncomfortable with having her take them orally.  ? ?Patient desaturated after a bath and unable to re cooperate as well and placed on bipap. On call trauma MD notified of medications not being taken.  ? ?Patient's husband also mentioned that he is not sure if she has been taking the anticonvulsants at home as prescribed.   ?

## 2021-05-11 NOTE — Progress Notes (Signed)
?  Transition of Care (TOC) Screening Note ? ? ?Patient Details  ?Name: Cassidy Tran ?Date of Birth: [birth year] ? ? ?Transition of Care [facility]) CM/SW Contact:    ?Ella Bodo, RN ?Phone Number: ?05/11/2021, 1:07 PM ? ? ? ?Transition of Care Department [facility]) has reviewed patient and no TOC needs have been identified at this time. We will continue to monitor patient advancement through interdisciplinary progression rounds. If new patient transition needs arise, please place a TOC consult. ? ?Reinaldo Raddle, RN, BSN  ?Trauma/Neuro ICU Case Manager ?[phone] ? ?

## 2021-05-11 NOTE — Progress Notes (Signed)
Patient started gagging and the cortrak tube was hanging out of her mouth when assessed.  Per the husband the water in the [place] went into her nose and choked her up and caused additional gagging.  The tube was removed and patient is okay. ? ?Will continue to monitor.  ?

## 2021-05-11 NOTE — Consult Note (Signed)
Reason for Consult: Mental status changes rule out shunt infection Referring Physician: Trauma Dr. [name] is an 46 y.o. female.  HPI: 46 year old female who is a patient of one of my partners who underwent ventriculoperitoneal shunt placement back in 2018 following a suboccipital craniectomy for Chiari.  Patient had presented to general surgery with a neuroendocrine tumor and underwent abdominal surgery with colectomy on the ninth subsequently went into respiratory distress required intubation from pulmonary edema.  Patient has been recovering from this is extubated however did have some altered mental status over the last 24 hours however according to nurse this is also coincided with some increased respiratory difficulties and oxygenation.  Patient currently is afebrile white count is 15.  Question has been raised as to whether the shunt is infected.  Past Medical History:  Diagnosis Date   Acid reflux    Allergy    Anxiety    Arthritis    lower back   ASCUS (atypical squamous cells of undetermined significance) on Pap smear 03/2011   NEG HR HPV   Asthma    Cancer (HCC)    skin cancer- age 18ish   Cervical dysplasia, mild 08/2010   LGSIL colposcopy biopsy showing koilocytotic atypia   Clotting disorder (HCC)    Depression    Endometriosis    Headache(784.0)    High risk HPV infection 12/2011   Pap normal   Hypertension    Pneumonia    2016ish   Seizures (HCC)    seizure disorder   Smoker    Von Willebrand disease     Past Surgical History:  Procedure Laterality Date   APPLICATION OF CRANIAL NAVIGATION N/A 11/24/2016   Procedure: APPLICATION OF CRANIAL NAVIGATION;  Surgeon: Cassidy Abu, MD;  Location: [facility];  Service: Neurosurgery;  Laterality: N/A;   BRAIN SURGERY     BREAST BIOPSY Left    CHOLECYSTECTOMY N/A 05/05/2021   Procedure: CHOLECYSTECTOMY;  Surgeon: Cassidy Monks, MD;  Location: [facility];  Service: General;  Laterality: N/A;   COLON RESECTION  N/A 05/05/2021   Procedure: RIGHT HEMICOLECTOMY;  Surgeon: Cassidy Monks, MD;  Location: [facility];  Service: General;  Laterality: N/A;   COLPOSCOPY     CYSTOSCOPY N/A 10/14/2012   Procedure: CYSTOSCOPY;  Surgeon: Cassidy Lords, MD;  Location: [facility];  Service: Gynecology;  Laterality: N/A;   DILATION AND CURETTAGE OF UTERUS     HYSTEROSCOPY WITH D & C  10/14/2010   Procedure: DILATATION AND CURETTAGE (D&C) /HYSTEROSCOPY;  Surgeon: Cassidy Lords, MD;  Location: [facility];  Service: Gynecology;  Laterality: N/A;   LAPAROSCOPIC HYSTERECTOMY N/A 10/14/2012   Procedure: HYSTERECTOMY TOTAL LAPAROSCOPIC;  Surgeon: Cassidy Lords, MD;  Location: [facility];  Service: Gynecology;  Laterality: N/A;  CPT [id number]  2 1/2 hours  Dr. Reynaldo Tran to assist.   LAPAROSCOPY  04/27/2011   Procedure: LAPAROSCOPY OPERATIVE;  Surgeon: Cassidy Lords, MD;  Location: [facility];  Service: Gynecology;  Laterality: N/A;  removal right cyst , lysis of adhesions, biopsy of peritoneum   LAPAROTOMY N/A 05/05/2021   Procedure: EXPLORATORY LAPAROTOMY;  Surgeon: Cassidy Monks, MD;  Location[facility];  Service: General;  Laterality: N/A;   NASAL SINUS SURGERY  1993   IN [place]   SUBOCCIPITAL CRANIECTOMY CERVICAL LAMINECTOMY N/A 11/06/2016   Procedure: Suboccipital decompression for chiari malformation;  Surgeon: Cassidy Abu, MD;  Location: [facility] OR;  Service: Neurosurgery;  Laterality: N/A;   TONSILLECTOMY  VENTRICULOPERITONEAL SHUNT N/A 11/24/2016   Procedure: SHUNT INSERTION VENTRICULAR-PERITONEAL with Brainlab;  Surgeon: Cassidy Abu, MD;  Location: [facility];  Service: Neurosurgery;  Laterality: N/A;  right side approach   WISDOM TOOTH EXTRACTION  1993    Family History  Problem Relation Age of Onset   Hypertension Mother    Heart disease Mother    Hyperlipidemia Mother    Diabetes Father    Hypertension Father    Heart disease Father    Hyperlipidemia Father    Stroke Father    Prostate cancer Father     Lymphoma Sister        NON-HODGKINS LYMPHOMA   Cancer Maternal Uncle        Lung and pancreatic cancer   Colon cancer Neg Hx    Rectal cancer Neg Hx    Esophageal cancer Neg Hx     Social History:  reports that she has been smoking cigarettes. She has a 10.00 pack-year smoking history. She has never used smokeless tobacco. She reports that she does not currently use alcohol. She reports that she does not use drugs.  Allergies:  Allergies  Allergen Reactions   Aspirin Other (See Comments)    Childhood reaction   Codeine Nausea And Vomiting    Tolerates Hydrocodone   Erythromycin Other (See Comments)    Childhood reaction, tolerate Zpak Childhood reaction does not know.   Lactose Intolerance (Gi) Diarrhea   Nsaids Nausea And Vomiting   Penicillins Other (See Comments)    Has patient had a PCN reaction causing immediate rash, facial/tongue/throat swelling, SOB or lightheadedness with hypotension: doesn't remember childhood Reaction  Has patient had a PCN reaction causing severe rash involving mucus membranes or skin necrosis: NO Has patient had a PCN reaction that required hospitalization NO Has patient had a PCN reaction occurring within the last 10 years: NO If all of the above answers are "NO", then may proceed with Cephalosporin use.     Medications: I have reviewed the patient's current medications.  Results for orders placed or performed during the hospital encounter of 05/05/21 (from the past 48 hour(s))  Global TEG Panel     Status: Abnormal   Collection Time: 05/09/21 10:50 AM  Result Value Ref Range   Citrated Kaolin (R) 4.9 4.6 - 9.1 min   Citrated Kaolin (K) 0.8 0.8 - 2.1 min   Citrated Kaolin Angle 79.4 (H) 63 - 78 deg   Citrated Kaolin (MA) 71.6 (H) 52 - 69 mm   Citrated Rapid TEG (MA) 72.2 (H) 52 - 70 mm   CK with Heparinase (R) 4.8 4.3 - 8.3 min   CFF Max Amplitude >52 (H) 15 - 32 mm   Citrated Functional Fibrinogen NOT CALCULATED 278 - 581 mg/dL     Comment: Performed at [facility], [address] [place], [place] [address]  Urine Culture     Status: None   Collection Time: 05/09/21  1:30 PM   Specimen: Urine, Clean Catch  Result Value Ref Range   Specimen Description URINE, CLEAN CATCH    Special Requests NONE    Culture      NO GROWTH Performed at [facility], [address], [place], [place] [address]    Report Status 05/10/2021 FINAL   D-dimer, quantitative     Status: Abnormal   Collection Time: 05/09/21  1:38 PM  Result Value Ref Range   D-Dimer, Quant 2.82 (H) 0.00 - 0.50 ug/mL-FEU    Comment: (NOTE)  At the manufacturer cut-off value of 0.5 g/mL FEU, this assay has a negative predictive value of 95-100%.This assay is intended for use in conjunction with a clinical pretest probability (PTP) assessment model to exclude pulmonary embolism (PE) and deep venous thrombosis (DVT) in outpatients suspected of PE or DVT. Results should be correlated with clinical presentation. Performed at [facility], [address] [place], [place] [address]   Blood gas, arterial     Status: Abnormal   Collection Time: 05/09/21  2:28 PM  Result Value Ref Range   pH, Arterial 7.39 7.35 - 7.45   pCO2 arterial 55 (H) 32 - 48 mmHg   pO2, Arterial 60 (L) 83 - 108 mmHg   Bicarbonate 33.3 (H) 20.0 - 28.0 mmol/L   Acid-Base Excess 6.7 (H) 0.0 - 2.0 mmol/L   O2 Saturation 92.7 %   Patient temperature 37.0    Collection site REVIEWED BY    Drawn by [id number]    Allens test (pass/fail) PASS PASS    Comment: Performed at [facility], [address] [place], [place] [address]  I-STAT 7, (LYTES, BLD GAS, ICA, H+H)     Status: Abnormal   Collection Time: 05/10/21  4:18 AM  Result Value Ref Range   pH, Arterial 7.432 7.35 - 7.45   pCO2 arterial 52.9 (H) 32 - 48 mmHg   pO2, Arterial 72 (L) 83 - 108 mmHg   Bicarbonate 35.2 (H) 20.0 - 28.0 mmol/L   TCO2 37 (H) 22 - 32 mmol/L   O2 Saturation 94 %   Acid-Base Excess 9.0  (H) 0.0 - 2.0 mmol/L   Sodium 141 135 - 145 mmol/L   Potassium 3.2 (L) 3.5 - 5.1 mmol/L   Calcium, Ion 1.07 (L) 1.15 - 1.40 mmol/L   HCT 34.0 (L) 36.0 - 46.0 %   Hemoglobin 11.6 (L) 12.0 - 15.0 g/dL   Patient temperature 84.6 F    Collection site RADIAL, ALLEN'S TEST ACCEPTABLE    Drawn by HIDE    Sample type ARTERIAL   APTT     Status: None   Collection Time: 05/10/21  5:22 AM  Result Value Ref Range   aPTT 28 24 - 36 seconds    Comment: Performed at [facility], [address], [place], [place] [address]  Factor 8 assay     Status: Abnormal   Collection Time: 05/10/21  5:22 AM  Result Value Ref Range   Coagulation Factor VIII 367 (H) 56 - 140 %    Comment: (NOTE) FVIII activity can increase in a variety of clinical situations including normal pregnancy, in samples drawn from patients (particularly children) who are visibly stressed at the time of phlebotomy, as acute phase reactants, or in response to certain drug therapies such as DDAVP.  Persistently elevated FVIII activity is a risk factor for venous thrombosis as well as recurrence of venous thrombosis.  Risk is graded and increases with the degree of elevation.  Although elevated FVIII activity has been identified to cluster within families, a genetic basis for the elevation has not yet been elucidated (Br J Haematol. 2012; 284:132-440). Performed At: [facility] [address] [place], [place] [id number] Jolene Schimke MD [id number]   Magnesium     Status: None   Collection Time: 05/10/21  9:11 AM  Result Value Ref Range   Magnesium 2.0 1.7 - 2.4 mg/dL    Comment: Performed at [facility], [address] [place], [place]  [address]  I-STAT 7, (LYTES, BLD GAS, ICA, H+H)     Status: Abnormal   Collection Time: 05/10/21 10:04 AM  Result Value Ref Range   pH, Arterial 7.464 (H) 7.35 - 7.45   pCO2 arterial 50.2 (H) 32 - 48 mmHg   pO2, Arterial 79 (L) 83 - 108 mmHg   Bicarbonate 36.3 (H) 20.0 -  28.0 mmol/L   TCO2 38 (H) 22 - 32 mmol/L   O2 Saturation 96 %   Acid-Base Excess 11.0 (H) 0.0 - 2.0 mmol/L   Sodium 138 135 - 145 mmol/L   Potassium 3.4 (L) 3.5 - 5.1 mmol/L   Calcium, Ion 1.09 (L) 1.15 - 1.40 mmol/L   HCT 32.0 (L) 36.0 - 46.0 %   Hemoglobin 10.9 (L) 12.0 - 15.0 g/dL   Patient temperature 40.9 F    Collection site RADIAL, ALLEN'S TEST ACCEPTABLE    Drawn by RT    Sample type ARTERIAL   I-STAT 7, (LYTES, BLD GAS, ICA, H+H)     Status: Abnormal   Collection Time: 05/10/21  5:17 PM  Result Value Ref Range   pH, Arterial 7.462 (H) 7.35 - 7.45   pCO2 arterial 49.0 (H) 32 - 48 mmHg   pO2, Arterial 61 (L) 83 - 108 mmHg   Bicarbonate 35.1 (H) 20.0 - 28.0 mmol/L   TCO2 37 (H) 22 - 32 mmol/L   O2 Saturation 92 %   Acid-Base Excess 10.0 (H) 0.0 - 2.0 mmol/L   Sodium 140 135 - 145 mmol/L   Potassium 3.3 (L) 3.5 - 5.1 mmol/L   Calcium, Ion 1.16 1.15 - 1.40 mmol/L   HCT 33.0 (L) 36.0 - 46.0 %   Hemoglobin 11.2 (L) 12.0 - 15.0 g/dL   Patient temperature 81.1 F    Collection site RADIAL, ALLEN'S TEST ACCEPTABLE    Drawn by RT    Sample type ARTERIAL   CBC     Status: Abnormal   Collection Time: 05/11/21  3:33 AM  Result Value Ref Range   WBC 15.7 (H) 4.0 - 10.5 K/uL   RBC 4.05 3.87 - 5.11 MIL/uL   Hemoglobin 11.8 (L) 12.0 - 15.0 g/dL   HCT 91.4 78.2 - 95.6 %   MCV 88.9 80.0 - 100.0 fL   MCH 29.1 26.0 - 34.0 pg   MCHC 32.8 30.0 - 36.0 g/dL   RDW 21.3 08.6 - 57.8 %   Platelets 286 150 - 400 K/uL   nRBC 0.0 0.0 - 0.2 %    Comment: Performed at [facility], [address], [place], [place] [address]  Basic metabolic panel     Status: Abnormal   Collection Time: 05/11/21  3:33 AM  Result Value Ref Range   Sodium 141 135 - 145 mmol/L   Potassium 3.4 (L) 3.5 - 5.1 mmol/L   Chloride 93 (L) 98 - 111 mmol/L   CO2 31 22 - 32 mmol/L   Glucose, Bld 80 70 - 99 mg/dL    Comment: Glucose reference range applies only to samples taken after fasting for at least 8 hours.    BUN 10 6 - 20 mg/dL   Creatinine, Ser 9.52 0.44 - 1.00 mg/dL   Calcium 9.0 8.9 - 84.1 mg/dL   GFR, Estimated >32 >44 mL/min    Comment: (NOTE) Calculated using the CKD-EPI Creatinine Equation (2021)    Anion gap 17 (H) 5 - 15    Comment: Performed at [facility], [address],  [place], [place] [address]    CT Angio Chest Pulmonary Embolism (PE) W or WO Contrast  Result Date: 05/09/2021 CLINICAL DATA:  PE suspected EXAM: CT ANGIOGRAPHY CHEST WITH CONTRAST TECHNIQUE: Multidetector CT imaging of the chest was performed using the standard protocol during bolus administration of intravenous contrast. Multiplanar CT image reconstructions and MIPs were obtained to evaluate the vascular anatomy. RADIATION DOSE REDUCTION: This exam was performed according to the departmental dose-optimization program which includes automated exposure control, adjustment of the mA and/or kV according to patient size and/or use of iterative reconstruction technique. CONTRAST:  OMNIPAQUE IOHEXOL 350 MG/ML SOLN COMPARISON:  05/07/2021 FINDINGS: Cardiovascular: Satisfactory opacification of the pulmonary arteries to the segmental level. No evidence of pulmonary embolism. Normal heart size. No pericardial effusion. Aortic atherosclerosis. Mediastinum/Nodes: Unchanged prominent mediastinal and hilar lymph nodes. Thyroid gland, trachea, and esophagus demonstrate no significant findings. Lungs/Pleura: Very extensive, somewhat geographic ground-glass airspace opacity and interlobular septal thickening throughout the lungs, worsened compared to prior examination. No pleural effusion or pneumothorax. Upper Abdomen: No acute abnormality. Musculoskeletal: No chest wall abnormality. No acute osseous findings. Review of the MIP images confirms the above findings. IMPRESSION: 1. Negative examination for pulmonary embolism. 2. Very extensive, somewhat geographic ground-glass airspace opacity and interlobular septal thickening  throughout the lungs, worsened compared to prior examination. Findings are consistent with edema, infection, and/or ARDS. 3. Unchanged prominent mediastinal and hilar lymph nodes, likely reactive. Aortic Atherosclerosis (ICD10-I70.0). Electronically Signed   By: Jearld Lesch M.D.   On: 05/09/2021 15:04   DG Chest Port 1 View  Result Date: 05/11/2021 CLINICAL DATA:  Respiratory failure.  Shortness of breath. EXAM: PORTABLE CHEST 1 VIEW COMPARISON:  AP chest 05/08/2021 FINDINGS: A catheter overlies the right neck right hemithorax and right hemiabdomen, presumably a ventriculoperitoneal shunt, unchanged. Cardiac silhouette and mediastinal contours are within normal limits. Mildly improved aeration of the bilateral upper lungs. Persistent bilateral mid and lower lung interstitial thickening and heterogeneous airspace opacities, mildly improved from prior. No definite pleural effusion. No pneumothorax. No acute skeletal abnormality. IMPRESSION: Improved bilateral lung aeration with more mild-to-moderate bilateral interstitial thickening and heterogeneous airspace opacities within the bilateral mid to lower lungs. Electronically Signed   By: Neita Garnet M.D.   On: 05/11/2021 08:31   VAS [place] LOWER EXTREMITY VENOUS (DVT)  Result Date: 05/09/2021  Lower Venous DVT Study Patient Name:  Cassidy Tran  Date of Exam:   05/09/2021 Medical Rec #: [id number]        Accession #:    [id number] Date of Birth: 02-Feb-1976       Patient Gender: F Patient Age:   69 years Exam Location:  [facility] Procedure:      VAS [place] LOWER EXTREMITY VENOUS (DVT) Referring Phys: Kris Mouton --------------------------------------------------------------------------------  Indications: Edema.  Comparison Study: no prior Performing Technologist: [place] Ponder RVS  Examination Guidelines: A complete evaluation includes B-mode imaging, spectral Doppler, color Doppler, and power Doppler as needed of all accessible portions of each  vessel. Bilateral testing is considered an integral part of a complete examination. Limited examinations for reoccurring indications may be performed as noted. The reflux portion of the exam is performed with the patient in reverse Trendelenburg.  +---------+---------------+---------+-----------+----------+--------------+ RIGHT    CompressibilityPhasicitySpontaneityPropertiesThrombus Aging +---------+---------------+---------+-----------+----------+--------------+ CFV      Full           Yes      Yes                                 +---------+---------------+---------+-----------+----------+--------------+  SFJ      Full                                                        +---------+---------------+---------+-----------+----------+--------------+ FV Prox  Full                                                        +---------+---------------+---------+-----------+----------+--------------+ FV Mid   Full                                                        +---------+---------------+---------+-----------+----------+--------------+ FV DistalFull                                                        +---------+---------------+---------+-----------+----------+--------------+ PFV      Full                                                        +---------+---------------+---------+-----------+----------+--------------+ POP      Full           Yes      Yes                                 +---------+---------------+---------+-----------+----------+--------------+ PTV      Full                                                        +---------+---------------+---------+-----------+----------+--------------+ PERO     Full                                                        +---------+---------------+---------+-----------+----------+--------------+   +---------+---------------+---------+-----------+----------+--------------+ LEFT      CompressibilityPhasicitySpontaneityPropertiesThrombus Aging +---------+---------------+---------+-----------+----------+--------------+ CFV      Full           Yes      Yes                                 +---------+---------------+---------+-----------+----------+--------------+ SFJ      Full                                                        +---------+---------------+---------+-----------+----------+--------------+  FV Prox  Full                                                        +---------+---------------+---------+-----------+----------+--------------+ FV Mid   Full                                                        +---------+---------------+---------+-----------+----------+--------------+ FV DistalFull                                                        +---------+---------------+---------+-----------+----------+--------------+ PFV      Full                                                        +---------+---------------+---------+-----------+----------+--------------+ POP      Full           Yes      Yes                                 +---------+---------------+---------+-----------+----------+--------------+ PTV      Full                                                        +---------+---------------+---------+-----------+----------+--------------+ PERO     Full                                                        +---------+---------------+---------+-----------+----------+--------------+     Summary: BILATERAL: - No evidence of deep vein thrombosis seen in the lower extremities, bilaterally. -No evidence of popliteal cyst, bilaterally.   *See table(s) above for measurements and observations. Electronically signed by Gerarda Fraction on 05/09/2021 at 7:29:23 PM.    Final     Review of Systems  Unable to perform ROS: Mental status change  Blood pressure (!) 130/52, pulse 75, temperature 98.9 F (37.2 C), temperature  source Oral, resp. rate (!) 25, height 5\' 3"  (1.6 m), weight 96.2 kg, last menstrual period 09/16/2012, SpO2 100 %. Physical Exam Neurological:     Comments: Patient with CPAP/BiPAP in place but awakens to stimulation follows commands in all 4 extremities once awake and eyes were open patient was tracking responsive and increased spontaneous movements.    Assessment/Plan: 46 year old with mental status changes following colectomy in the setting of a previously existing ventriculoperitoneal shunt.  However I do think there are multitude of other reasons for her mental status changes and I do not feel  like we need to tap her shunt at this time due to the potential risk of introducing either a infection to the shunt system or damage to the reservoir or valve in a [place]man shunt system.  In addition consideration could be given to lumbar puncture however I do not think that that its indicated at this time however may consider that further down the road prior to and instead of shunt tapping.  Husband ports patient does have significant mental status issues as baseline with memory and cognition even prior to this hospitalization.  In addition I feel that the patient is on IV antibiotics and this may lower her yield of capturing an infection but also could be treating any dental underlying infection and recommend proceeding with systemic work-up blood, sputum and urine cultures and continue IV antibiotics for now.  CT of her head shows that the shunt is functioning fine.  Mariam Dollar 05/11/2021, 9:27 AM

## 2021-05-11 NOTE — Procedures (Signed)
Cortrak ? ?Person Inserting Tube:  Renessa Wellnitz, Creola Corn, RD ?Tube Type:  Cortrak - 43 inches ?Tube Size:  10 ?Tube Location:  Left nare ?Initial Placement:  Stomach ?Secured by: Dorann Lodge ?Technique Used to Measure Tube Placement:  Marking at nare/corner of mouth ?Cortrak Secured At:  65 cm ? ?Cortrak Tube Team Note: ? ?Consult received to place a Cortrak feeding tube.  ? ?X-ray is required, abdominal x-ray has been ordered by the Cortrak team. Please confirm tube placement before using the Cortrak tube.  ? ?If the tube becomes dislodged please keep the tube and contact the Cortrak team at [email or web address] (password [email or web address]) for replacement.  ?If after hours and replacement cannot be delayed, place a NG tube and confirm placement with an abdominal x-ray.  ? ? ? ?Theone Stanley., MS, RD, LDN (she/her/hers) ?RD pager number and weekend/on-call pager number located in [place]. ? ? ?

## 2021-05-11 NOTE — Procedures (Signed)
? ?  Procedure Note ? ?Date: 05/11/2021 ? ?Procedure: arterial line placement--left, radial artery, with ultrasound guidance ? ?Pre-op diagnosis: frequent ABG ?Post-op diagnosis: same ? ?Surgeon: [name], MD ? ?Anesthesia: none ? ?EBL: <5cc ?Drains/Implants: 4 cm arterial catheter ? ?Description of procedure: Time-out was performed verifying correct patient, procedure, site, laterality, and signature of informed consent. The right wrist was prepped and draped in the usual sterile fashion. The artery was localized with ultrasound guidance and accessed using an arterial catheterization kit on 2 attempt(s) but the wire unable to be threaded so the procedure aborted. The left wrist was prepped and draped in the usual sterile fashion. The artery was localized with ultrasound guidance and accessed using an arterial catheterization kit after 2 attempt(s).The wire was advanced and the sheath advanced over the wire. The needle and wire were removed with pulsatile, bright red blood noted through the catheter. The catheter was connected to a transducer and a good waveform was noted. The catheter was secured in place with suture and tegaderm. The patient tolerated the procedure well. There were no complications.  ? ? ?[name], MD ?General and Trauma Surgery ?[facility] ? ?

## 2021-05-12 ENCOUNTER — Inpatient Hospital Stay (HOSPITAL_COMMUNITY): Payer: No Typology Code available for payment source

## 2021-05-12 DIAGNOSIS — C7A8 Other malignant neuroendocrine tumors: Secondary | ICD-10-CM | POA: Diagnosis not present

## 2021-05-12 DIAGNOSIS — G40309 Generalized idiopathic epilepsy and epileptic syndromes, not intractable, without status epilepticus: Secondary | ICD-10-CM | POA: Diagnosis not present

## 2021-05-12 LAB — POCT I-STAT 7, (LYTES, BLD GAS, ICA,H+H)
Acid-Base Excess: 9 mmol/L — ABNORMAL HIGH (ref 0.0–2.0)
Acid-Base Excess: 8 mmol/L — ABNORMAL HIGH (ref 0.0–2.0)
Acid-Base Excess: 6 mmol/L — ABNORMAL HIGH (ref 0.0–2.0)
Bicarbonate: 34.4 mmol/L — ABNORMAL HIGH (ref 20.0–28.0)
Bicarbonate: 35.1 mmol/L — ABNORMAL HIGH (ref 20.0–28.0)
Bicarbonate: 30.6 mmol/L — ABNORMAL HIGH (ref 20.0–28.0)
Calcium, Ion: 1.1 mmol/L — ABNORMAL LOW (ref 1.15–1.40)
Calcium, Ion: 1.11 mmol/L — ABNORMAL LOW (ref 1.15–1.40)
Calcium, Ion: 1.08 mmol/L — ABNORMAL LOW (ref 1.15–1.40)
HCT: 33 % — ABNORMAL LOW (ref 36.0–46.0)
HCT: 34 % — ABNORMAL LOW (ref 36.0–46.0)
HCT: 29 % — ABNORMAL LOW (ref 36.0–46.0)
Hemoglobin: 11.2 g/dL — ABNORMAL LOW (ref 12.0–15.0)
Hemoglobin: 11.6 g/dL — ABNORMAL LOW (ref 12.0–15.0)
Hemoglobin: 9.9 g/dL — ABNORMAL LOW (ref 12.0–15.0)
O2 Saturation: 96 %
O2 Saturation: 100 %
O2 Saturation: 97 %
Patient temperature: 98.7
Potassium: 2.8 mmol/L — ABNORMAL LOW (ref 3.5–5.1)
Potassium: 2.8 mmol/L — ABNORMAL LOW (ref 3.5–5.1)
Potassium: 3.2 mmol/L — ABNORMAL LOW (ref 3.5–5.1)
Sodium: 142 mmol/L (ref 135–145)
Sodium: 142 mmol/L (ref 135–145)
Sodium: 141 mmol/L (ref 135–145)
TCO2: 36 mmol/L — ABNORMAL HIGH (ref 22–32)
TCO2: 37 mmol/L — ABNORMAL HIGH (ref 22–32)
TCO2: 32 mmol/L (ref 22–32)
pCO2 arterial: 52.4 mmHg — ABNORMAL HIGH (ref 32–48)
pCO2 arterial: 58 mmHg — ABNORMAL HIGH (ref 32–48)
pCO2 arterial: 45.2 mmHg (ref 32–48)
pH, Arterial: 7.426 (ref 7.35–7.45)
pH, Arterial: 7.39 (ref 7.35–7.45)
pH, Arterial: 7.438 (ref 7.35–7.45)
pO2, Arterial: 83 mmHg (ref 83–108)
pO2, Arterial: 223 mmHg — ABNORMAL HIGH (ref 83–108)
pO2, Arterial: 93 mmHg (ref 83–108)

## 2021-05-12 LAB — CSF CELL COUNT WITH DIFFERENTIAL
RBC Count, CSF: 4 /mm3 — ABNORMAL HIGH
Tube #: 3
WBC, CSF: 1 /mm3 (ref 0–5)

## 2021-05-12 LAB — GLUCOSE, CAPILLARY
Glucose-Capillary: 96 mg/dL (ref 70–99)
Glucose-Capillary: 99 mg/dL (ref 70–99)
Glucose-Capillary: 96 mg/dL (ref 70–99)
Glucose-Capillary: 144 mg/dL — ABNORMAL HIGH (ref 70–99)
Glucose-Capillary: 203 mg/dL — ABNORMAL HIGH (ref 70–99)
Glucose-Capillary: 186 mg/dL — ABNORMAL HIGH (ref 70–99)

## 2021-05-12 LAB — CBC
HCT: 33.4 % — ABNORMAL LOW (ref 36.0–46.0)
Hemoglobin: 10.7 g/dL — ABNORMAL LOW (ref 12.0–15.0)
MCH: 28.8 pg (ref 26.0–34.0)
MCHC: 32 g/dL (ref 30.0–36.0)
MCV: 90 fL (ref 80.0–100.0)
Platelets: 289 10*3/uL (ref 150–400)
RBC: 3.71 MIL/uL — ABNORMAL LOW (ref 3.87–5.11)
RDW: 14.4 % (ref 11.5–15.5)
WBC: 18.9 10*3/uL — ABNORMAL HIGH (ref 4.0–10.5)
nRBC: 0 % (ref 0.0–0.2)

## 2021-05-12 LAB — BASIC METABOLIC PANEL
Anion gap: 15 (ref 5–15)
BUN: 16 mg/dL (ref 6–20)
CO2: 31 mmol/L (ref 22–32)
Calcium: 8.7 mg/dL — ABNORMAL LOW (ref 8.9–10.3)
Chloride: 97 mmol/L — ABNORMAL LOW (ref 98–111)
Creatinine, Ser: 1.01 mg/dL — ABNORMAL HIGH (ref 0.44–1.00)
GFR, Estimated: >60 mL/min (ref >60)
Glucose, Bld: 97 mg/dL (ref 70–99)
Potassium: 2.7 mmol/L — CL (ref 3.5–5.1)
Sodium: 143 mmol/L (ref 135–145)

## 2021-05-12 LAB — PROTEIN AND GLUCOSE, CSF
Glucose, CSF: 81 mg/dL — ABNORMAL HIGH (ref 40–70)
Total  Protein, CSF: 43 mg/dL (ref 15–45)

## 2021-05-12 LAB — MAGNESIUM
Magnesium: 2.5 mg/dL — ABNORMAL HIGH (ref 1.7–2.4)
Magnesium: 2.4 mg/dL (ref 1.7–2.4)

## 2021-05-12 LAB — PHOSPHORUS
Phosphorus: 4.1 mg/dL (ref 2.5–4.6)
Phosphorus: 4.4 mg/dL (ref 2.5–4.6)

## 2021-05-12 LAB — AMMONIA: Ammonia: 104 umol/L — ABNORMAL HIGH (ref 9–35)

## 2021-05-12 LAB — VALPROIC ACID LEVEL: Valproic Acid Lvl: 75 ug/mL (ref 50.0–100.0)

## 2021-05-12 MED ORDER — ORAL CARE MOUTH RINSE
15.0000 mL | OROMUCOSAL | Status: DC
  Administered 2021-05-13 – 2021-06-15 (×301): 15 mL via OROMUCOSAL

## 2021-05-12 MED ORDER — NOREPINEPHRINE 4 MG/250ML-% IV SOLN
0.0000 ug/min | INTRAVENOUS | Status: DC
  Administered 2021-05-12: 6 ug/min via INTRAVENOUS
  Filled 2021-05-12: qty 250

## 2021-05-12 MED ORDER — FENTANYL CITRATE PF 50 MCG/ML IJ SOSY: 50.0000 ug | PREFILLED_SYRINGE | Freq: Once | INTRAMUSCULAR | Status: AC

## 2021-05-12 MED ORDER — VALPROATE SODIUM 100 MG/ML IV SOLN
375.0000 mg | Freq: Four times a day (QID) | INTRAVENOUS | Status: DC
  Administered 2021-05-12 – 2021-05-13 (×5): 375 mg via INTRAVENOUS
  Filled 2021-05-12 (×7): qty 3.75

## 2021-05-12 MED ORDER — LACTATED RINGERS IV BOLUS
1000.0000 mL | Freq: Once | INTRAVENOUS | Status: AC
  Administered 2021-05-12: 1000 mL via INTRAVENOUS

## 2021-05-12 MED ORDER — POTASSIUM CHLORIDE 20 MEQ PO PACK: 40.0000 meq | PACK | ORAL | Status: DC

## 2021-05-12 MED ORDER — FENTANYL CITRATE PF 50 MCG/ML IJ SOSY
PREFILLED_SYRINGE | INTRAMUSCULAR | Status: AC
  Administered 2021-05-12: 100 ug via INTRAVENOUS
  Filled 2021-05-12: qty 2

## 2021-05-12 MED ORDER — ROCURONIUM BROMIDE 10 MG/ML (PF) SYRINGE
PREFILLED_SYRINGE | INTRAVENOUS | Status: AC
  Administered 2021-05-12: 100 mg via INTRAVENOUS
  Filled 2021-05-12: qty 10

## 2021-05-12 MED ORDER — OXYCODONE HCL 5 MG/5ML PO SOLN
5.0000 mg | ORAL | Status: DC | PRN
  Administered 2021-05-15: 10 mg
  Administered 2021-05-16: 5 mg
  Administered 2021-05-16: 10 mg
  Administered 2021-05-17: 5 mg
  Administered 2021-06-10 – 2021-06-13 (×10): 10 mg
  Administered 2021-06-13: 5 mg
  Administered 2021-06-14 – 2021-06-15 (×3): 10 mg
  Filled 2021-05-12: qty 5
  Filled 2021-05-12: qty 10
  Filled 2021-05-12: qty 5
  Filled 2021-05-12 (×4): qty 10
  Filled 2021-05-12 (×2): qty 5
  Filled 2021-05-12 (×8): qty 10
  Filled 2021-05-12: qty 5
  Filled 2021-05-12: qty 10

## 2021-05-12 MED ORDER — FENTANYL BOLUS VIA INFUSION
50.0000 ug | INTRAVENOUS | Status: DC | PRN
  Administered 2021-05-15: 100 ug via INTRAVENOUS
  Administered 2021-05-15 (×2): 50 ug via INTRAVENOUS
  Administered 2021-05-16: 100 ug via INTRAVENOUS
  Administered 2021-05-16 – 2021-05-17 (×4): 50 ug via INTRAVENOUS
  Administered 2021-05-17: 25 ug via INTRAVENOUS
  Administered 2021-05-17: 100 ug via INTRAVENOUS
  Administered 2021-05-17 (×4): 50 ug via INTRAVENOUS
  Administered 2021-05-17 – 2021-05-22 (×2): 100 ug via INTRAVENOUS
  Administered 2021-05-24 – 2021-05-28 (×6): 50 ug via INTRAVENOUS
  Filled 2021-05-12: qty 100

## 2021-05-12 MED ORDER — IOHEXOL 300 MG/ML  SOLN
100.0000 mL | Freq: Once | INTRAMUSCULAR | Status: AC | PRN
  Administered 2021-05-12: 100 mL via INTRAVENOUS

## 2021-05-12 MED ORDER — PROPOFOL 1000 MG/100ML IV EMUL
0.0000 ug/kg/min | INTRAVENOUS | Status: DC
  Administered 2021-05-12: 30 ug/kg/min via INTRAVENOUS
  Administered 2021-05-12: 20 ug/kg/min via INTRAVENOUS
  Administered 2021-05-12 – 2021-05-13 (×2): 35 ug/kg/min via INTRAVENOUS
  Administered 2021-05-13: 25 ug/kg/min via INTRAVENOUS
  Filled 2021-05-12 (×4): qty 100

## 2021-05-12 MED ORDER — FENTANYL 2500MCG IN NS 250ML (10MCG/ML) PREMIX INFUSION
50.0000 ug/h | INTRAVENOUS | Status: DC
  Administered 2021-05-12: 50 ug/h via INTRAVENOUS
  Administered 2021-05-13: 150 ug/h via INTRAVENOUS
  Administered 2021-05-14: 50 ug/h via INTRAVENOUS
  Administered 2021-05-16: 75 ug/h via INTRAVENOUS
  Administered 2021-05-17: 100 ug/h via INTRAVENOUS
  Administered 2021-05-17: 75 ug/h via INTRAVENOUS
  Administered 2021-05-18: 100 ug/h via INTRAVENOUS
  Administered 2021-05-19 – 2021-05-20 (×2): 150 ug/h via INTRAVENOUS
  Administered 2021-05-20: 200 ug/h via INTRAVENOUS
  Administered 2021-05-22: 50 ug/h via INTRAVENOUS
  Administered 2021-05-23 – 2021-05-24 (×3): 150 ug/h via INTRAVENOUS
  Administered 2021-05-25 – 2021-05-26 (×4): 200 ug/h via INTRAVENOUS
  Administered 2021-05-27: 100 ug/h via INTRAVENOUS
  Administered 2021-05-27: 200 ug/h via INTRAVENOUS
  Administered 2021-05-28: 75 ug/h via INTRAVENOUS
  Administered 2021-05-29 – 2021-05-30 (×3): 150 ug/h via INTRAVENOUS
  Administered 2021-05-31: 200 ug/h via INTRAVENOUS
  Filled 2021-05-12 (×26): qty 250

## 2021-05-12 MED ORDER — LOPERAMIDE HCL 1 MG/7.5ML PO SUSP
4.0000 mg | Freq: Two times a day (BID) | ORAL | Status: DC
  Administered 2021-05-12 – 2021-05-14 (×6): 4 mg
  Filled 2021-05-12 (×6): qty 30

## 2021-05-12 MED ORDER — ROCURONIUM BROMIDE 10 MG/ML (PF) SYRINGE: 100.0000 mg | PREFILLED_SYRINGE | Freq: Once | INTRAVENOUS | Status: AC

## 2021-05-12 MED ORDER — ETOMIDATE 2 MG/ML IV SOLN: 20.0000 mg | Freq: Once | INTRAVENOUS | Status: AC

## 2021-05-12 MED ORDER — POLYETHYLENE GLYCOL 3350 17 G PO PACK: 17.0000 g | PACK | Freq: Every day | ORAL | Status: DC

## 2021-05-12 MED ORDER — VALPROATE SODIUM 100 MG/ML IV SOLN
375.0000 mg | Freq: Once | INTRAVENOUS | Status: AC
  Administered 2021-05-12: 375 mg via INTRAVENOUS
  Filled 2021-05-12: qty 3.75

## 2021-05-12 MED ORDER — ETOMIDATE 2 MG/ML IV SOLN
INTRAVENOUS | Status: AC
  Administered 2021-05-12: 20 mg via INTRAVENOUS
  Filled 2021-05-12: qty 10

## 2021-05-12 MED ORDER — DOCUSATE SODIUM 50 MG/5ML PO LIQD: 100.0000 mg | Freq: Two times a day (BID) | ORAL | Status: DC

## 2021-05-12 MED ORDER — NOREPINEPHRINE 4 MG/250ML-% IV SOLN
INTRAVENOUS | Status: AC
  Administered 2021-05-12: 5 ug/min via INTRAVENOUS
  Filled 2021-05-12: qty 250

## 2021-05-12 MED ORDER — MIDAZOLAM HCL 2 MG/2ML IJ SOLN
INTRAMUSCULAR | Status: AC
  Filled 2021-05-12: qty 4

## 2021-05-12 MED ORDER — POTASSIUM CHLORIDE 20 MEQ PO PACK
40.0000 meq | PACK | ORAL | Status: AC
  Administered 2021-05-12 (×2): 40 meq
  Filled 2021-05-12: qty 2

## 2021-05-12 MED ORDER — CHLORHEXIDINE GLUCONATE 0.12% ORAL RINSE (MEDLINE KIT)
15.0000 mL | Freq: Two times a day (BID) | OROMUCOSAL | Status: DC
  Administered 2021-05-13 – 2021-06-15 (×65): 15 mL via OROMUCOSAL

## 2021-05-12 NOTE — Progress Notes (Signed)
Arterial line assessed by RT at bedside. Difficult to flush, no blood return. Sutured in place, connections tight. Art line removed. MD notified.  ?

## 2021-05-12 NOTE — Progress Notes (Signed)
Patient was transported to CT & back to 4N19 without any complications.  ?

## 2021-05-12 NOTE — Procedures (Signed)
? ?  Procedure Note ? ?Date: 05/12/2021 ? ?Procedure: arterial line placement--right, femoral artery, with ultrasound guidance ? ?Pre-op diagnosis: hypotension, need for invasive blood pressure monitoring ?Post-op diagnosis: same ? ?Surgeon: [name], MD ? ?Anesthesia: none ? ?EBL: <5cc ?Drains/Implants: 12 cm arterial catheter ? ?Description of procedure: Time-out was performed verifying correct patient, procedure, site, laterality, and signature of informed consent. The right groin was prepped and draped in the usual sterile fashion. The artery was localized with ultrasound guidance and accessed using an arterial catheterization kit after 1 attempt(s). The wire was advanced and the sheath advanced over the wire. The needle and wire were removed with pulsatile, bright red blood noted through the catheter. The catheter was connected to a transducer and a good waveform was noted. The catheter was secured in place with suture and tegaderm. The patient tolerated the procedure well. There were no complications.  ? ? ?[name], MD ?General and Trauma Surgery ?[facility] ? ?

## 2021-05-12 NOTE — Procedures (Signed)
Intubation Procedure Note ? ?Cassidy Tran  ?[id number]  ?1975-11-24 ? ?Date:05/12/21  ?Time:10:29 AM  ? ?Provider Performing:Anyesha N Kaula Klenke  ? ? ?Procedure: Intubation (67619) ? ?Indication(s) ?Respiratory Failure ? ?Consent ?Risks of the procedure as well as the alternatives and risks of each were explained to the patient and/or caregiver.  Consent for the procedure was obtained and is signed in the bedside chart ? ? ?Anesthesia ?Etomidate and Rocuronium ? ? ?Time Out ?Verified patient identification, verified procedure, site/side was marked, verified correct patient position, special equipment/implants available, medications/allergies/relevant history reviewed, required imaging and test results available. ? ? ?Sterile Technique ?Usual hand hygeine, masks, and gloves were used ? ? ?Procedure Description ?Patient positioned in bed supine.  Sedation given as noted above.  Patient was intubated with endotracheal tube using Glidescope.  View was Grade 1 full glottis .  Number of attempts was 1.  Colorimetric CO2 detector was consistent with tracheal placement. ? ? ?Complications/Tolerance ?None; patient tolerated the procedure well. ?Chest X-ray is ordered to verify placement. ? ? ?EBL ?none ? ? ?Specimen(s) ?None ? ?[name], MD ?General and Trauma Surgery ?[facility] ? ? ?

## 2021-05-12 NOTE — Progress Notes (Addendum)
Patient ID: Cassidy Tran, female   DOB: 30-Apr-1975, 46 y.o.   MRN: [id number] ?Patient is seen today after noting the consult by Dr. Dominica Severin cram yesterday.  Patient has had progressive deterioration with further increase in white count to 18,000.  She has been reintubated.  A CT scan of the brain performed this morning demonstrates essentially no change with her slitlike ventricles and no evidence of any focal lesions in the brain. ? ?I discussed the situation with Dr. Bobbye Morton yesterday and today.  Given the concern for the potential of meningitis I have advised a lumbar puncture and this has been performed today and dictated separately. ? ?I met with the parents and the patient's husband.  I discussed the situation and offered support for them.  I noted the initial findings of the cerebral spinal fluid that it is clear and colorless and this bodes well for there being no infection.  We will see what the laboratory studies show. ?

## 2021-05-12 NOTE — Procedures (Signed)
Cassidy Tran is a 46 year old individual who underwent a colectomy on 9 March.  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  She had done well with the shunt and had been doing well postoperatively when she developed some respiratory difficulties.  She has had progressive increase in her white count.  There is concern for the potential of meningitis secondary to shunt infection and the diagnostic lumbar puncture is now being performed. ? ?Preoperative diagnosis: Shunt infection possible meningitis ?Postoperative diagnosis: Same ?Procedure: Diagnostic lumbar puncture ?Surgeon: Kristeen Miss ?Indications: See above ?Procedure: The patient has been on the ventilator with some moderate sedation.  She was turned into the left lateral decubitus position.  The back was prepped with Betadine.  By palpation the interspinous space at L3-L4 was identified.  The skin over this area was infiltrated with 1/2 cc of lidocaine 1% without epinephrine.  A 20-gauge spinal needle was then inserted into the interlaminar space at this level.  Spinal fluid was obtained atraumatically.  The fluid was clear and colorless and initial opening pressure was measured at 23 cm of water.  A total of 22 cc of cerebrospinal fluid was drained and 4 separate tubes.  Closing pressure was 11 cm of water.  The fluid will be sent for culture and Gram stain glucose and protein evaluation in addition to cytology. ?

## 2021-05-12 NOTE — Progress Notes (Signed)
EEG complete - results pending 

## 2021-05-12 NOTE — Progress Notes (Signed)
Nutrition Follow-up ? ?DOCUMENTATION CODES:  ? ?Obesity unspecified ? ?INTERVENTION:  ? ?Initiate tube feeding via Cortrak: ?Start Osmolite 1.5 at 25 ml/h and advance by 68m every 8 hours until goal rate of 588mhr (1320 ml per day) ?Prosource TF 45 ml TID ?  ?Provides 2100 kcal, 108 gm protein, 1006 ml free water daily ? ?NUTRITION DIAGNOSIS:  ? ?Increased nutrient needs related to acute illness as evidenced by estimated needs. ?Ongoing.  ? ?GOAL:  ? ?Patient will meet greater than or equal to 90% of their needs ?Met with TF at goal.  ? ?MONITOR:  ? ?TF tolerance ? ?REASON FOR ASSESSMENT:  ? ?Other (Comment) (Cortrak) ?  ? ?ASSESSMENT:  ? 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 PMH significant for type IA von Willebrand disease. ? ?Pt discussed during ICU rounds and with RN. Per Dr EnMarin Olpt with need adjuvant chemo. Pt encephalopathic, plan for 24-hour EEG, LP today, and intubated.  ?  ?3/9 s/p ex lap, R hemicolectomy, cholecystectomy for well differentiated neuroendocrine turmor with 1/7 + nodes.  ?3/15 s/p cortrak placement; tip gastric; started TF -- cortrak removed later that night ?3/16 intubated; OG tube placed  ? ?Patient is currently intubated on ventilator support ? ?Temp (24hrs), Avg:98.5 ?F (36.9 ?C), Min:98.1 ?F (36.7 ?C), Max:98.9 ?F (37.2 ?C) ?MAP > 67 ? ?Propofol: 5 ml/hr provides: 132 kcal  ?Medications reviewed and include: colace, imodium BID, protonix, miralax ?Fentanyl  ?Levophed @ 5 mcg  ? ?Labs reviewed: K: 3.2, Ammonia: 104 ? ?OG: per CT tube tip in transverse portion of the duodenum  ? ?Diet Order:   ?Diet Order   ? ?       ?  Diet NPO time specified  Diet effective now       ?  ? ?  ?  ? ?  ? ? ?EDUCATION NEEDS:  ? ?Education needs have been addressed ? ?Skin:  Skin Assessment: Skin Integrity Issues: ?Skin Integrity Issues:: Incisions ?Incisions: abdomen (closed) ? ?Last BM:  3/16 x 4 large; rectal tube inserted ? ?Height:  ? ?Ht Readings from Last 1 Encounters:   ?05/12/21 '5\' 3"'  (1.6 m)  ? ? ?Weight:  ? ?Wt Readings from Last 1 Encounters:  ?05/12/21 97 kg  ? ? ?Ideal Body Weight:  52.3 kg ? ?BMI:  Body mass index is 37.88 kg/m?. ? ?Estimated Nutritional Needs:  ? ?Kcal:  2000-2200 ? ?Protein:  100-115g ? ?Fluid:  >/=2L ? ?HeLockie Pares RD, LDN, CNSC ?See AMiON for contact information  ? ?

## 2021-05-12 NOTE — Consult Note (Signed)
Neurology Consultation ?Reason for Consult: ams ?Referring Physician: Dr Reather Laurence ? ?CC: ams ? ?History is obtained from: chart review as patient is intubated and sedated ? ?HPI: Cassidy Tran is a 46 y.o. female  with h/o epilepsy, Chairi nmalformation s/p VP shunt in 2018, von Willebrand disease, biliary dyskinesia, neuroendocrine tumor of terminal ileum, Meckels diverticulum s/p exploratory laparotomy, cholecystectomy, R hemicolectomy on 05/05/2021. On 05/08/2021, patient was noted to have worsening confusion. CXR showed  bilateral opacities pneumonia versus ARDS, on Zosyn D3/7. CT head didn't sow any acute abnormalities. Patient was seen by neurosurg Dr Saintclair Halsted to rule out shunt infection.  Urine culture did not show any growth.  Blood culture did not show any growth in 12 hours.  Respiratory panel was negative.  Sodium is within normal limits.  CBC has been fluctuating, was as high as 27.5, improved to 11.3, 18.9 today.  Per RN, patient was awake and intermittently following commands this morning but still confused.  She had to be eventually intubated due to respiratory failure. Neurology's been consulted due to persistent encephalopathy.   ? ?Epilepsy history: I reviewed Dr. Joesph July note from office visit on 09/13/2015.  Patient has had seizures since the age of 6.seizure semiology described as jerking.  Last generalized tonic-clonic seizure in October 2010.  Of note, prior pharmacy review, patient had not refilled her antiseizure medications for a while and husband reported she occasionally takes her medications when she has jerks. ? ?Current AEDs: Depakote ER 2000 mg twice daily and zonisamide 100 mg BID ?Prior AEDs: Keppra (irritability) ? ?Routine EEG in 2011 showed intermittent 4 to 5 Hz spike and polyspike and wave complexes. ?MRI brain in 2015 demonstrates normal volume and signal of the hippocampi. There is no evidence of heterotopia. Cerebellar tonsillar ectopia measures 4 mm. Brain is otherwise normal  in signal. No abnormal enhancement. ? ? ?ROS: Unable to obtain due to altered mental status.  ? ?Past Medical History:  ?Diagnosis Date  ? Acid reflux   ? Allergy   ? Anxiety   ? Arthritis   ? lower back  ? ASCUS (atypical squamous cells of undetermined significance) on Pap smear 03/2011  ? NEG HR HPV  ? Asthma   ? Cancer [facility])   ? skin cancer- age 46ish  ? Cervical dysplasia, mild 08/2010  ? LGSIL colposcopy biopsy showing koilocytotic atypia  ? Clotting disorder ([place])   ? Depression   ? Endometriosis   ? Headache(784.0)   ? High risk HPV infection 12/2011  ? Pap normal  ? Hypertension   ? Pneumonia   ? 2016ish  ? Seizures ([place])   ? seizure disorder  ? Smoker   ? Von Willebrand disease   ? ? ?Family History  ?Problem Relation Age of Onset  ? Hypertension Mother   ? Heart disease Mother   ? Hyperlipidemia Mother   ? Diabetes Father   ? Hypertension Father   ? Heart disease Father   ? Hyperlipidemia Father   ? Stroke Father   ? Prostate cancer Father   ? Lymphoma Sister   ?     NON-HODGKINS LYMPHOMA  ? Cancer Maternal Uncle   ?     Lung and pancreatic cancer  ? Colon cancer Neg Hx   ? Rectal cancer Neg Hx   ? Esophageal cancer Neg Hx   ? ? ?Social History:  reports that she has been smoking cigarettes. She has a 10.00 pack-year smoking history. She has never used smokeless tobacco. She  reports that she does not currently use alcohol. She reports that she does not use drugs. ? ? ?Medications Prior to Admission  ?Medication Sig Dispense Refill Last Dose  ? acetaminophen (TYLENOL) 500 MG tablet Take 1,000 mg by mouth every 6 (six) hours as needed for mild pain or moderate pain. pain    05/04/2021  ? ADVAIR HFA 230-21 MCG/ACT inhaler INHALE 2 PUFFS BY MOUTH INTO THE LUNGS TWICE DAILY 12 g 0 Past Week  ? albuterol (PROVENTIL) (2.5 MG/3ML) 0.083% nebulizer solution Take 3 mLs (2.5 mg total) by nebulization every 6 (six) hours as needed for wheezing or shortness of breath. 75 mL 1 05/05/2021  ? amLODipine-valsartan (EXFORGE)  10-320 MG tablet Take 1 tablet by mouth daily.   05/05/2021 at 0600  ? atorvastatin (LIPITOR) 20 MG tablet Take 20 mg by mouth daily.   05/05/2021 at 0600  ? divalproex (DEPAKOTE ER) 500 MG 24 hr tablet Take 500-1,000 mg by mouth See admin instructions. Take 500 mg by mouth in the morning and 1000 mg at night   05/04/2021  ? Melatonin 10 MG TABS Take 1 tablet by mouth at bedtime.   05/04/2021  ? mometasone (NASONEX) 50 MCG/ACT nasal spray Use one to two sprays in each nostril once daily (Patient taking differently: Place 1-2 sprays into the nose daily as needed (allergies).) 17 g 5 Past Week  ? montelukast (SINGULAIR) 10 MG tablet TAKE 1 TABLET BY MOUTH AT BEDTIME 30 tablet 0 05/04/2021  ? pantoprazole (PROTONIX) 40 MG tablet Take twice a day for 6 weeks then reduce to once a day and continue to titrate to lowest effective dose or off completely if no reflux symptoms.  Best taken on empty stomach 10-20 min before food. 60 tablet 1 Past Week  ? PROAIR HFA 108 (90 Base) MCG/ACT inhaler INHALE 2 PUFFS INTO THE LUNGS EVERY 4 HOURS AS NEEDED FOR WHEEZING OR SHORTNESS OF BREATH 8.5 g 0 Past Week  ? Probiotic Product (PROBIOTIC-10 PO) Take 1 capsule by mouth daily.   Past Week  ? zonisamide (ZONEGRAN) 100 MG capsule Take 100 mg by mouth at bedtime.   Past Week  ? desmopressin (DDAVP NASAL) 0.01 % solution Place 1 spray (10 mcg total) into the nose daily. Administer one spray morning of procedure and again the morning after her procedure. (Patient not taking: Reported on 03/15/2021) 1 mL 0   ? meclizine (ANTIVERT) 25 MG tablet TAKE 1 TABLET BY MOUTH THREE TIMES DAILY AS NEEDED FOR DIZZINESS 20 tablet 0 More than a month  ? tretinoin (RETIN-A) 0.05 % cream Apply 1 application topically at bedtime as needed (acne).   More than a month  ?  ? ? ?Exam: ?Current vital signs: ?BP (!) 147/70   Pulse 98   Temp 98.2 ?F (36.8 ?C) (Axillary)   Resp (!) 22   Ht '5\' 3"'$  (1.6 m)   Wt 97 kg   LMP 09/16/2012   SpO2 98%   BMI 37.88 kg/m?  ?Vital  signs in last 24 hours: ?Temp:  [98.1 ?F (36.7 ?C)-98.9 ?F (37.2 ?C)] 98.2 ?F (36.8 ?C) (03/16 0800) ?Pulse Rate:  [70-121] 98 (03/16 0920) ?Resp:  [2-28] 22 (03/16 0920) ?BP: (109-165)/(44-107) 147/70 (03/16 0920) ?SpO2:  [87 %-100 %] 98 % (03/16 0920) ?Arterial Line BP: (87-198)/(50-125) 145/80 (03/16 0920) ?FiO2 (%):  [50 %-100 %] 60 % (03/16 1006) ?Weight:  [97 kg] 97 kg (03/16 0500) ? ? ?Physical Exam  ?Constitutional: Appears well-developed and well-nourished.  ?Eyes: No  scleral injection ?Skin: Warm ?Respiratory: Intubated ?Neuro: On propofol at 20 mcg/h, winces to noxious stimuli, does not follow commands, PERRLA, no gaze deviation, antigravity strength in all 4 extremities ? ? ?I have reviewed labs in epic and the results pertinent to this consultation are: ?CBC:  ?Recent Labs  ?Lab 05/11/21 ?0333 05/11/21 ?1322 05/12/21 ?4765 05/12/21 ?0756 05/12/21 ?1003  ?WBC 15.7*  --  18.9*  --   --   ?HGB 11.8*   < > 10.7* 11.2* 11.6*  ?HCT 36.0   < > 33.4* 33.0* 34.0*  ?MCV 88.9  --  90.0  --   --   ?PLT 286  --  289  --   --   ? < > = values in this interval not displayed.  ? ? ?Basic Metabolic Panel:  ?Lab Results  ?Component Value Date  ? NA 142 05/12/2021  ? K 2.8 (L) 05/12/2021  ? CO2 31 05/12/2021  ? GLUCOSE 97 05/12/2021  ? BUN 16 05/12/2021  ? CREATININE 1.01 (H) 05/12/2021  ? CALCIUM 8.7 (L) 05/12/2021  ? GFRNONAA >60 05/12/2021  ? GFRAA >60 11/24/2016  ? ?Lipid Panel:  ?Lab Results  ?Component Value Date  ? [place] 201 (H) 01/26/2020  ? ?HgbA1c: No results found for: HGBA1C ?Urine Drug Screen: No results found for: LABOPIA, COCAINSCRNUR, [place], [place], THCU, LABBARB  ?Alcohol Level No results found for: ETH ? ? ?I have reviewed the images obtained: ?CT head without contrast 05/26/2021: Stable VP shunt. No acute intracranial abnormality. ? ?ASSESSMENT/PLAN: 69 female with history of epilepsy, Chiari malformation status post VP shunt placement admitted for  neuroendocrine tumor of terminal ileum, Meckels  diverticulum s/p exploratory laparotomy, cholecystectomy, R hemicolectomy on 05/05/2021.  Postop course has been complicated by encephalopathy. ? ?Epilepsy ?Chiari malformation status post VP shunt ?Acute encephal

## 2021-05-12 NOTE — Progress Notes (Addendum)
? ?Trauma/Critical Care Follow Up Note ? ?Subjective:  ?  ?Overnight Issues:  ? ?Objective:  ?Vital signs for last 24 hours: ?Temp:  [98.1 ?F (36.7 ?C)-99.3 ?F (37.4 ?C)] 98.7 ?F (37.1 ?C) (03/16 0400) ?Pulse Rate:  [70-98] 75 (03/16 0700) ?Resp:  [13-28] 25 (03/16 0700) ?BP: (109-150)/(44-76) 134/51 (03/16 0700) ?SpO2:  [87 %-100 %] 98 % (03/16 0700) ?Arterial Line BP: (87-179)/(50-91) 142/61 (03/16 0700) ?FiO2 (%):  [50 %-98 %] 98 % (03/15 2029) ? ?Hemodynamic parameters for last 24 hours: ?  ? ?Intake/Output from previous day: ?03/15 0701 - 03/16 0700 ?In: 433 [I.V.:32.2; NG/GT:149.4; IV Piggyback:251.4] ?Out: 1355 [FFMBW:4665]  ?Intake/Output this shift: ?No intake/output data recorded. ? ?Vent settings for last 24 hours: ?FiO2 (%):  [50 %-98 %] 98 % ? ?Physical Exam:  ?Gen: comfortable, no distress ?Neuro: not f/c ?HEENT: PERRL ?Neck: supple ?CV: RRR ?Pulm: unlabored breathing on bipap ?Abd: soft, NT, incision c/d/i ?GU: clear yellow urine, foley ?Extr: wwp, no edema ? ? ?Results for orders placed or performed during the hospital encounter of 05/05/21 (from the past 24 hour(s))  ?I-STAT 7, (LYTES, BLD GAS, ICA, H+H)     Status: Abnormal  ? Collection Time: 05/11/21  1:22 PM  ?Result Value Ref Range  ? pH, Arterial 7.451 (H) 7.35 - 7.45  ? pCO2 arterial 50.0 (H) 32 - 48 mmHg  ? pO2, Arterial 98 83 - 108 mmHg  ? Bicarbonate 34.7 (H) 20.0 - 28.0 mmol/L  ? TCO2 36 (H) 22 - 32 mmol/L  ? O2 Saturation 98 %  ? Acid-Base Excess 9.0 (H) 0.0 - 2.0 mmol/L  ? Sodium 139 135 - 145 mmol/L  ? Potassium 3.3 (L) 3.5 - 5.1 mmol/L  ? Calcium, Ion 1.03 (L) 1.15 - 1.40 mmol/L  ? HCT 32.0 (L) 36.0 - 46.0 %  ? Hemoglobin 10.9 (L) 12.0 - 15.0 g/dL  ? Patient temperature 98.8 F   ? Collection site RADIAL, ALLEN'S TEST ACCEPTABLE   ? Drawn by RT   ? Sample type ARTERIAL   ?Phosphorus     Status: None  ? Collection Time: 05/11/21  4:28 PM  ?Result Value Ref Range  ? Phosphorus 4.5 2.5 - 4.6 mg/dL  ?Magnesium     Status: None  ?  Collection Time: 05/11/21  4:28 PM  ?Result Value Ref Range  ? Magnesium 2.1 1.7 - 2.4 mg/dL  ?Basic metabolic panel     Status: Abnormal  ? Collection Time: 05/11/21  4:28 PM  ?Result Value Ref Range  ? Sodium 141 135 - 145 mmol/L  ? Potassium 3.6 3.5 - 5.1 mmol/L  ? Chloride 95 (L) 98 - 111 mmol/L  ? CO2 30 22 - 32 mmol/L  ? Glucose, Bld 102 (H) 70 - 99 mg/dL  ? BUN 14 6 - 20 mg/dL  ? Creatinine, Ser 1.03 (H) 0.44 - 1.00 mg/dL  ? Calcium 8.6 (L) 8.9 - 10.3 mg/dL  ? GFR, Estimated >60 >60 mL/min  ? Anion gap 16 (H) 5 - 15  ?Glucose, capillary     Status: None  ? Collection Time: 05/11/21  7:29 PM  ?Result Value Ref Range  ? Glucose-Capillary 95 70 - 99 mg/dL  ?Glucose, capillary     Status: None  ? Collection Time: 05/11/21 11:25 PM  ?Result Value Ref Range  ? Glucose-Capillary 98 70 - 99 mg/dL  ?Glucose, capillary     Status: None  ? Collection Time: 05/12/21  3:12 AM  ?Result Value Ref Range  ?  Glucose-Capillary 96 70 - 99 mg/dL  ?CBC     Status: Abnormal  ? Collection Time: 05/12/21  5:57 AM  ?Result Value Ref Range  ? WBC 18.9 (H) 4.0 - 10.5 K/uL  ? RBC 3.71 (L) 3.87 - 5.11 MIL/uL  ? Hemoglobin 10.7 (L) 12.0 - 15.0 g/dL  ? HCT 33.4 (L) 36.0 - 46.0 %  ? MCV 90.0 80.0 - 100.0 fL  ? MCH 28.8 26.0 - 34.0 pg  ? MCHC 32.0 30.0 - 36.0 g/dL  ? RDW 14.4 11.5 - 15.5 %  ? Platelets 289 150 - 400 K/uL  ? nRBC 0.0 0.0 - 0.2 %  ?Basic metabolic panel     Status: Abnormal  ? Collection Time: 05/12/21  5:57 AM  ?Result Value Ref Range  ? Sodium 143 135 - 145 mmol/L  ? Potassium 2.7 (LL) 3.5 - 5.1 mmol/L  ? Chloride 97 (L) 98 - 111 mmol/L  ? CO2 31 22 - 32 mmol/L  ? Glucose, Bld 97 70 - 99 mg/dL  ? BUN 16 6 - 20 mg/dL  ? Creatinine, Ser 1.01 (H) 0.44 - 1.00 mg/dL  ? Calcium 8.7 (L) 8.9 - 10.3 mg/dL  ? GFR, Estimated >60 >60 mL/min  ? Anion gap 15 5 - 15  ?Phosphorus     Status: None  ? Collection Time: 05/12/21  5:57 AM  ?Result Value Ref Range  ? Phosphorus 4.1 2.5 - 4.6 mg/dL  ?Magnesium     Status: Abnormal  ? Collection  Time: 05/12/21  5:57 AM  ?Result Value Ref Range  ? Magnesium 2.5 (H) 1.7 - 2.4 mg/dL  ? ? ?Assessment & Plan: ?The plan of care was discussed with the bedside nurse for the day, who is in agreement with this plan and no additional concerns were raised.  ? ?Present on Admission: ? Neuroendocrine carcinoma ([place]) ? ? ? LOS: 7 days  ? ?Additional comments:I reviewed the patient's new clinical lab test results.   and I reviewed the patients new imaging test results.   ? ?Neuroendocrine tumor on colonoscopy - s/p exlap, R hemicolectomy, cholecystectomy 3/9. Well differentiated neuroendocrine tumor with 1/7 +nodes. D/w Dr. Marin Olp 3/15 will likely need adjuvant chemo. ?Respiratory failure 2/2 volume overload - CT chest 3/11 and 3/13 with no PE. Intubate today ?Volume overload - euvolemic now clinically, but persistent airspace disease ?Altered mental status - CT head 3/13 normal (h/o VP shunt). Changed cefepime to zosyn 3/13, total 7d course, empiric coverage for PNA. Resp cx not possible. Ucx 3/13 negative. D/w Dr. Ellene Route and plan for LP today to be performed by CCM/IR. Repeat CT head and CT A/P. Bcx 3/15 NGTD ?vWD - humate P per Heme, end 3/12, hypercoagulable on TEG 3/13 ?FEN - NPO, place OGT, start TF after all procedures/imaging today, replete hypokalemia ?DVT - SCDs, s/p DDVAP, LMWH, LE duplex neg for DVT 3/13 ?Foley - replaced 3/13, consider removal today vs tomorrow ?Dispo - ICU ? ?Critical Care Total Time: 60 minutes ? ?[name], MD ?Trauma & General Surgery ?Please use [email or web address] to contact on call provider ? ?05/12/2021 ? ?*Care during the described time interval was provided by me. I have reviewed this patient's available data, including medical history, events of note, physical examination and test results as part of my evaluation. ? ?  ?

## 2021-05-12 NOTE — Procedures (Signed)
? ?  Procedure Note ? ?Date: 05/12/2021 ? ?Procedure: central venous catheter placement--right, internal jugular vein, with ultrasound guidance ? ?Pre-op diagnosis: hypotension, need for administration of vasopressors ?Post-op diagnosis: same ? ?Surgeon: [name], MD ? ?Anesthesia: local  ?EBL: <5cc ?Drains/Implants: 16 cm, triple lumen central venous catheter ? ?Description of procedure: Time-out was performed verifying correct patient, procedure, site, laterality, and signature of informed consent. The right neck was prepped and draped in the usual sterile fashion. The right internal jugular vein was localized with ultrasound guidance. Five ccs of local anesthetic was infiltrated at the site of venous access. The right internal jugular vein was accessed using an introducer needle and a guidewire passed through the needle. The needle was removed and a skin nick was made. The tract was dilated and the central venous catheter advanced over the guidewire followed by removal of the guidewire. All ports drew blood easily and all were flushed with saline. The catheter was secured to the skin with suture and a sterile dressing. The patient tolerated the procedure well. There were no immediate complications. Follow up chest x-ray was ordered to confirm positioning and the absence of a pneumothorax. ? ?[name], MD ?General and Trauma Surgery ?[facility] ? ?

## 2021-05-13 ENCOUNTER — Inpatient Hospital Stay (HOSPITAL_COMMUNITY): Payer: No Typology Code available for payment source

## 2021-05-13 DIAGNOSIS — G9340 Encephalopathy, unspecified: Secondary | ICD-10-CM | POA: Diagnosis not present

## 2021-05-13 DIAGNOSIS — R4689 Other symptoms and signs involving appearance and behavior: Secondary | ICD-10-CM

## 2021-05-13 DIAGNOSIS — R569 Unspecified convulsions: Secondary | ICD-10-CM | POA: Diagnosis not present

## 2021-05-13 DIAGNOSIS — G40309 Generalized idiopathic epilepsy and epileptic syndromes, not intractable, without status epilepticus: Secondary | ICD-10-CM | POA: Diagnosis not present

## 2021-05-13 DIAGNOSIS — R4182 Altered mental status, unspecified: Secondary | ICD-10-CM

## 2021-05-13 DIAGNOSIS — C7A8 Other malignant neuroendocrine tumors: Secondary | ICD-10-CM | POA: Diagnosis not present

## 2021-05-13 LAB — PHOSPHORUS
Phosphorus: 1.1 mg/dL — ABNORMAL LOW (ref 2.5–4.6)
Phosphorus: 3.2 mg/dL (ref 2.5–4.6)

## 2021-05-13 LAB — CBC
HCT: 30.4 % — ABNORMAL LOW (ref 36.0–46.0)
Hemoglobin: 10 g/dL — ABNORMAL LOW (ref 12.0–15.0)
MCH: 29.7 pg (ref 26.0–34.0)
MCHC: 32.9 g/dL (ref 30.0–36.0)
MCV: 90.2 fL (ref 80.0–100.0)
Platelets: 277 10*3/uL (ref 150–400)
RBC: 3.37 MIL/uL — ABNORMAL LOW (ref 3.87–5.11)
RDW: 14.6 % (ref 11.5–15.5)
WBC: 15.6 10*3/uL — ABNORMAL HIGH (ref 4.0–10.5)
nRBC: 0.2 % (ref 0.0–0.2)

## 2021-05-13 LAB — BASIC METABOLIC PANEL
Anion gap: 11 (ref 5–15)
Anion gap: 9 (ref 5–15)
BUN: 20 mg/dL (ref 6–20)
BUN: 18 mg/dL (ref 6–20)
CO2: 30 mmol/L (ref 22–32)
CO2: 29 mmol/L (ref 22–32)
Calcium: 8.1 mg/dL — ABNORMAL LOW (ref 8.9–10.3)
Calcium: 7.7 mg/dL — ABNORMAL LOW (ref 8.9–10.3)
Chloride: 103 mmol/L (ref 98–111)
Chloride: 107 mmol/L (ref 98–111)
Creatinine, Ser: 1.21 mg/dL — ABNORMAL HIGH (ref 0.44–1.00)
Creatinine, Ser: 0.86 mg/dL (ref 0.44–1.00)
GFR, Estimated: 56 mL/min — ABNORMAL LOW (ref >60)
GFR, Estimated: >60 mL/min (ref >60)
Glucose, Bld: 140 mg/dL — ABNORMAL HIGH (ref 70–99)
Glucose, Bld: 149 mg/dL — ABNORMAL HIGH (ref 70–99)
Potassium: 2.5 mmol/L — CL (ref 3.5–5.1)
Potassium: 4.1 mmol/L (ref 3.5–5.1)
Sodium: 144 mmol/L (ref 135–145)
Sodium: 145 mmol/L (ref 135–145)

## 2021-05-13 LAB — TRIGLYCERIDES: Triglycerides: 579 mg/dL — ABNORMAL HIGH (ref <150)

## 2021-05-13 LAB — GLUCOSE, CAPILLARY
Glucose-Capillary: 155 mg/dL — ABNORMAL HIGH (ref 70–99)
Glucose-Capillary: 135 mg/dL — ABNORMAL HIGH (ref 70–99)
Glucose-Capillary: 175 mg/dL — ABNORMAL HIGH (ref 70–99)
Glucose-Capillary: 160 mg/dL — ABNORMAL HIGH (ref 70–99)
Glucose-Capillary: 174 mg/dL — ABNORMAL HIGH (ref 70–99)
Glucose-Capillary: 133 mg/dL — ABNORMAL HIGH (ref 70–99)

## 2021-05-13 LAB — MAGNESIUM: Magnesium: 2.4 mg/dL (ref 1.7–2.4)

## 2021-05-13 LAB — CYTOLOGY - NON PAP

## 2021-05-13 MED ORDER — DEXMEDETOMIDINE HCL IN NACL 400 MCG/100ML IV SOLN
0.4000 ug/kg/h | INTRAVENOUS | Status: DC
  Administered 2021-05-17: 0.4 ug/kg/h via INTRAVENOUS
  Filled 2021-05-13: qty 100

## 2021-05-13 MED ORDER — POTASSIUM PHOSPHATES 15 MMOLE/5ML IV SOLN
45.0000 mmol | Freq: Once | INTRAVENOUS | Status: AC
  Administered 2021-05-13: 45 mmol via INTRAVENOUS
  Filled 2021-05-13: qty 15

## 2021-05-13 MED ORDER — POTASSIUM CHLORIDE 20 MEQ PO PACK
40.0000 meq | PACK | ORAL | Status: AC
  Administered 2021-05-13 (×3): 40 meq
  Filled 2021-05-13 (×3): qty 2

## 2021-05-13 MED ORDER — LACTATED RINGERS IV BOLUS
500.0000 mL | Freq: Once | INTRAVENOUS | Status: AC
  Administered 2021-05-13: 500 mL via INTRAVENOUS

## 2021-05-13 MED ORDER — SODIUM CHLORIDE 0.9 % IV SOLN
750.0000 mg | Freq: Two times a day (BID) | INTRAVENOUS | Status: DC
  Administered 2021-05-13 – 2021-05-19 (×14): 750 mg via INTRAVENOUS
  Filled 2021-05-13 (×17): qty 7.5

## 2021-05-13 NOTE — Progress Notes (Signed)
Patient ID: Cassidy Tran, female   DOB: 05/22/1975, 46 y.o.   MRN: [id number] ?Patient remains sedated on ventilator.  Pupils are small and reactive.  CSF results demonstrate normal protein negative cell count and glucose is normal.  Somewhat elevated opening pressure suggests a process of pseudotumor cerebri.  This is particularly notable in light of the patient's slitlike ventricles on CT scan.  For now supportive care continues.  Briefly talk to patient's parents. ?

## 2021-05-13 NOTE — Progress Notes (Signed)
RT note: RT and RN transported vent patient to CT and back to 4N. Vital signs stable through out. ?

## 2021-05-13 NOTE — Progress Notes (Signed)
? ?Trauma/Critical Care Follow Up Note ? ?Subjective:  ?  ?Overnight Issues:  ? ?Objective:  ?Vital signs for last 24 hours: ?Temp:  [98.2 ?F (36.8 ?C)-99.9 ?F (37.7 ?C)] 99.8 ?F (37.7 ?C) (03/17 0400) ?Pulse Rate:  [67-121] 74 (03/17 0715) ?Resp:  [2-26] 24 (03/17 0724) ?BP: (73-165)/(36-107) 97/48 (03/17 0700) ?SpO2:  [84 %-100 %] 97 % (03/17 0724) ?Arterial Line BP: (110-198)/(47-125) 117/51 (03/17 0715) ?FiO2 (%):  [60 %-100 %] 80 % (03/17 0724) ?Weight:  [96.8 kg] 96.8 kg (03/17 0500) ? ?Hemodynamic parameters for last 24 hours: ?  ? ?Intake/Output from previous day: ?03/16 0701 - 03/17 0700 ?In: 2865.4 [I.V.:886.5; NG/GT:1318.7; IV Piggyback:660.2] ?Out: 1275 [GQQPY:1950]  ?Intake/Output this shift: ?No intake/output data recorded. ? ?Vent settings for last 24 hours: ?Vent Mode: PRVC ?FiO2 (%):  [60 %-100 %] 80 % ?Set Rate:  [22 bmp-24 bmp] 24 bmp ?Vt Set:  [410 mL-500 mL] 500 mL ?PEEP:  [8 cmH20] 8 cmH20 ?Plateau Pressure:  [26 cmH20-39 cmH20] 39 cmH20 ? ?Physical Exam:  ?Gen: comfortable, no distress ?Neuro: non-focal exam ?HEENT: PERRL ?Neck: supple ?CV: RRR ?Pulm: unlabored breathing on MV ?Abd: soft, NT, incision c/d/i ?GU: clear yellow urine, foley  ?Extr: wwp, no edema ? ? ?Results for orders placed or performed during the hospital encounter of 05/05/21 (from the past 24 hour(s))  ?I-STAT 7, (LYTES, BLD GAS, ICA, H+H)     Status: Abnormal  ? Collection Time: 05/12/21  7:56 AM  ?Result Value Ref Range  ? pH, Arterial 7.426 7.35 - 7.45  ? pCO2 arterial 52.4 (H) 32 - 48 mmHg  ? pO2, Arterial 83 83 - 108 mmHg  ? Bicarbonate 34.4 (H) 20.0 - 28.0 mmol/L  ? TCO2 36 (H) 22 - 32 mmol/L  ? O2 Saturation 96 %  ? Acid-Base Excess 9.0 (H) 0.0 - 2.0 mmol/L  ? Sodium 142 135 - 145 mmol/L  ? Potassium 2.8 (L) 3.5 - 5.1 mmol/L  ? Calcium, Ion 1.10 (L) 1.15 - 1.40 mmol/L  ? HCT 33.0 (L) 36.0 - 46.0 %  ? Hemoglobin 11.2 (L) 12.0 - 15.0 g/dL  ? Patient temperature 98.7 F   ? Collection site art line   ? Drawn by RT   ?  Sample type ARTERIAL   ?I-STAT 7, (LYTES, BLD GAS, ICA, H+H)     Status: Abnormal  ? Collection Time: 05/12/21 10:03 AM  ?Result Value Ref Range  ? pH, Arterial 7.390 7.35 - 7.45  ? pCO2 arterial 58.0 (H) 32 - 48 mmHg  ? pO2, Arterial 223 (H) 83 - 108 mmHg  ? Bicarbonate 35.1 (H) 20.0 - 28.0 mmol/L  ? TCO2 37 (H) 22 - 32 mmol/L  ? O2 Saturation 100 %  ? Acid-Base Excess 8.0 (H) 0.0 - 2.0 mmol/L  ? Sodium 142 135 - 145 mmol/L  ? Potassium 2.8 (L) 3.5 - 5.1 mmol/L  ? Calcium, Ion 1.11 (L) 1.15 - 1.40 mmol/L  ? HCT 34.0 (L) 36.0 - 46.0 %  ? Hemoglobin 11.6 (L) 12.0 - 15.0 g/dL  ? Collection site art line   ? Drawn by RT   ? Sample type ARTERIAL   ?Valproic acid level     Status: None  ? Collection Time: 05/12/21 12:09 PM  ?Result Value Ref Range  ? Valproic Acid Lvl 75 50.0 - 100.0 ug/mL  ?I-STAT 7, (LYTES, BLD GAS, ICA, H+H)     Status: Abnormal  ? Collection Time: 05/12/21 12:17 PM  ?Result Value Ref Range  ?  pH, Arterial 7.438 7.35 - 7.45  ? pCO2 arterial 45.2 32 - 48 mmHg  ? pO2, Arterial 93 83 - 108 mmHg  ? Bicarbonate 30.6 (H) 20.0 - 28.0 mmol/L  ? TCO2 32 22 - 32 mmol/L  ? O2 Saturation 97 %  ? Acid-Base Excess 6.0 (H) 0.0 - 2.0 mmol/L  ? Sodium 141 135 - 145 mmol/L  ? Potassium 3.2 (L) 3.5 - 5.1 mmol/L  ? Calcium, Ion 1.08 (L) 1.15 - 1.40 mmol/L  ? HCT 29.0 (L) 36.0 - 46.0 %  ? Hemoglobin 9.9 (L) 12.0 - 15.0 g/dL  ? Collection site RADIAL, ALLEN'S TEST ACCEPTABLE   ? Drawn by RT   ? Sample type ARTERIAL   ?Glucose, capillary     Status: None  ? Collection Time: 05/12/21  1:07 PM  ?Result Value Ref Range  ? Glucose-Capillary 96 70 - 99 mg/dL  ?Ammonia     Status: Abnormal  ? Collection Time: 05/12/21  2:01 PM  ?Result Value Ref Range  ? Ammonia 104 (H) 9 - 35 umol/L  ?Glucose, capillary     Status: Abnormal  ? Collection Time: 05/12/21  4:07 PM  ?Result Value Ref Range  ? Glucose-Capillary 144 (H) 70 - 99 mg/dL  ?Phosphorus     Status: None  ? Collection Time: 05/12/21  4:36 PM  ?Result Value Ref Range  ?  Phosphorus 4.4 2.5 - 4.6 mg/dL  ?Magnesium     Status: None  ? Collection Time: 05/12/21  4:36 PM  ?Result Value Ref Range  ? Magnesium 2.4 1.7 - 2.4 mg/dL  ?Protein and glucose, CSF     Status: Abnormal  ? Collection Time: 05/12/21  4:51 PM  ?Result Value Ref Range  ? Glucose, CSF 81 (H) 40 - 70 mg/dL  ? Total  Protein, CSF 43 15 - 45 mg/dL  ?CSF cell count with differential     Status: Abnormal  ? Collection Time: 05/12/21  4:52 PM  ?Result Value Ref Range  ? Tube # 3   ? Color, CSF COLORLESS COLORLESS  ? Appearance, CSF CLEAR CLEAR  ? Supernatant NOT INDICATED   ? RBC Count, CSF 4 (H) 0 /cu mm  ? WBC, CSF 1 0 - 5 /cu mm  ? Other Cells, CSF TOO FEW TO COUNT, SMEAR AVAILABLE FOR REVIEW   ?CSF culture w Stat Gram Stain     Status: None (Preliminary result)  ? Collection Time: 05/12/21  4:54 PM  ? Specimen: CSF; Cerebrospinal Fluid  ?Result Value Ref Range  ? Specimen Description CSF   ? Special Requests NONE   ? Gram Stain    ?  NO WBC SEEN ?NO ORGANISMS SEEN ?CYTOSPIN SMEAR ?Performed at [facility], [place] [address], [place], [place] [address] ?  ? Culture PENDING   ? Report Status PENDING   ?Glucose, capillary     Status: Abnormal  ? Collection Time: 05/12/21  7:22 PM  ?Result Value Ref Range  ? Glucose-Capillary 203 (H) 70 - 99 mg/dL  ?Glucose, capillary     Status: Abnormal  ? Collection Time: 05/12/21 11:03 PM  ?Result Value Ref Range  ? Glucose-Capillary 186 (H) 70 - 99 mg/dL  ?Glucose, capillary     Status: Abnormal  ? Collection Time: 05/13/21  3:42 AM  ?Result Value Ref Range  ? Glucose-Capillary 155 (H) 70 - 99 mg/dL  ?CBC     Status: Abnormal  ? Collection Time: 05/13/21  5:00 AM  ?Result Value  Ref Range  ? WBC 15.6 (H) 4.0 - 10.5 K/uL  ? RBC 3.37 (L) 3.87 - 5.11 MIL/uL  ? Hemoglobin 10.0 (L) 12.0 - 15.0 g/dL  ? HCT 30.4 (L) 36.0 - 46.0 %  ? MCV 90.2 80.0 - 100.0 fL  ? MCH 29.7 26.0 - 34.0 pg  ? MCHC 32.9 30.0 - 36.0 g/dL  ? RDW 14.6 11.5 - 15.5 %  ? Platelets 277 150 - 400 K/uL  ? nRBC 0.2 0.0 -  0.2 %  ?Basic metabolic panel     Status: Abnormal  ? Collection Time: 05/13/21  5:00 AM  ?Result Value Ref Range  ? Sodium 144 135 - 145 mmol/L  ? Potassium 2.5 (LL) 3.5 - 5.1 mmol/L  ? Chloride 103 98 - 111 mmol/L  ? CO2 30 22 - 32 mmol/L  ? Glucose, Bld 140 (H) 70 - 99 mg/dL  ? BUN 20 6 - 20 mg/dL  ? Creatinine, Ser 1.21 (H) 0.44 - 1.00 mg/dL  ? Calcium 8.1 (L) 8.9 - 10.3 mg/dL  ? GFR, Estimated 56 (L) >60 mL/min  ? Anion gap 11 5 - 15  ?Phosphorus     Status: Abnormal  ? Collection Time: 05/13/21  5:00 AM  ?Result Value Ref Range  ? Phosphorus 1.1 (L) 2.5 - 4.6 mg/dL  ?Magnesium     Status: None  ? Collection Time: 05/13/21  5:00 AM  ?Result Value Ref Range  ? Magnesium 2.4 1.7 - 2.4 mg/dL  ?Triglycerides     Status: Abnormal  ? Collection Time: 05/13/21  5:00 AM  ?Result Value Ref Range  ? Triglycerides 579 (H) <150 mg/dL  ? ? ?Assessment & Plan: ?The plan of care was discussed with the bedside nurse for the day, who is in agreement with this plan and no additional concerns were raised.  ? ?Present on Admission: ? Neuroendocrine carcinoma ([place]) ? ? ? LOS: 8 days  ? ?Additional comments:I reviewed the patient's new clinical lab test results.   and I reviewed the patients new imaging test results.   ? ?Neuroendocrine tumor on colonoscopy - s/p exlap, R hemicolectomy, cholecystectomy 3/9. Well differentiated neuroendocrine tumor with 1/7 +nodes. D/w Dr. Marin Olp 3/15 will likely need adjuvant chemo. ?Respiratory failure 2/2 volume overload - CT chest 3/11 and 3/13 with no PE. Intubated 3/16, ABG slightly alkalotic, drop RR to 18, CXR with bibasilar airspace dz, slightly improved from yest ?Volume overload - euvolemic now clinically, but persistent airspace disease ?Altered mental status - CT head 3/13 normal (h/o VP shunt). Changed cefepime to zosyn 3/13, total 7d course, empiric coverage for PNA. Resp cx not possible. Ucx 3/13 negative. LP by Dr. Ellene Route 3/16, so far negative. Repeat CT head and CT A/P 3/16  negative. Bcx 3/15 NGTD. Elevated NH4 yest, question of adherence to depakote therapy at home inducing hyperammonemia when resumed on admission, Neurology on board, recs for change to vimpat today. EEG 3/16 without sz

## 2021-05-13 NOTE — Procedures (Signed)
Patient Name: Cassidy Tran  ?MRN: [id number]  ?Epilepsy Attending: Lora Havens  ?Referring Physician/Provider: Lora Havens, MD ?Date: 05/12/2021 ?Duration: 22.21 mins ? ?Patient history: 46yo F with h/o epilepsy now with ams. EEG to evaluate for seizure. ? ?Level of alertness: lethargic ? ?AEDs during EEG study: VPA, Propofol, ZNS ? ?Technical aspects: This EEG study was done with scalp electrodes positioned according to the 10-20 International system of electrode placement. Electrical activity was acquired at a sampling rate of '500Hz'$  and reviewed with a high frequency filter of '70Hz'$  and a low frequency filter of '1Hz'$ . EEG data were recorded continuously and digitally stored.  ? ?Description: EEG showed continuous generalized 3 to 6 Hz theta-delta slowing. Hyperventilation and photic stimulation were not performed.    ? ?ABNORMALITY ?- Continuous slow, generalized ? ?IMPRESSION: ?This study is suggestive of moderate diffuse encephalopathy, nonspecific etiology. No seizures or epileptiform discharges were seen throughout the recording. ? ?Lora Havens  ? ?

## 2021-05-13 NOTE — Progress Notes (Signed)
Subjective: ?Continues to be encephaloapthic. LP negative ? ?Exam: ?Vitals:  ? 05/13/21 0800 05/13/21 0900  ?BP: (!) 118/56 (!) 115/51  ?Pulse: 76 90  ?Resp: (!) 24 18  ?Temp: 100 ?F (37.8 ?C)   ?SpO2: 98% 96%  ? ?Gen: In bed, NAD ?Resp: non-labored breathing, no acute distress ?Abd: soft, nt ? ?Neuro: ?MS: does not open eyes or follow commands.  ?CN: PERRL, grimaces symmetrically ?Motor: She withdraws to noxious stimulation in all four extremities ?Sensory: As above ? ?Pertinent Labs: ?Ammonia 104 ? ?Impression: 46 year old female with a history of epilepsy associated with isolated "jerks" history of generalized discharges on EEG.  She has not had a fully convulsive seizure since 2010, but is still has occasional myoclonus.  She was admitted for abdominal surgery and in her postoperative course became confused.  Despite initially reporting compliance with medications, on further review it appears she was not taking her medications reliably.  She was taking her Zonegran more frequently than her Depakote, but unclear how often she was taking her Depakote. ? ?She has an ammonia of 300, which certainly could be suggestive of a hyperammonemic encephalopathy associated with Depakote.  This is more common when combined with either Zonegran or Topamax, and precludes further therapy with Depakote at least in the short-term. ? ?She had a history of irritability with Keppra, but given this is a relatively mild side effect, I would favor using it in the short-term as it is not associated with these effects and is not hepatically metabolized.  Over the long-term, Zonegran monotherapy, Topamax monotherapy, or Lamictal monotherapy may be significant considerations, but I would favor getting her through the acute phase first. ? ?Given the fact that she is obtunded and we are making multiple changes to her medications I would favor continuing EEG monitoring for now. ? ?Recommendations: ?1) discontinue Depakote ?2) start  levetiracetam 750 twice daily ?3) continue LTM EEG ? ?Roland Rack, MD ?[facility] ?[phone] ? ?If 7pm- 7am, please page neurology on call as listed in [place].' ?

## 2021-05-13 NOTE — Procedures (Addendum)
Patient Name: Cassidy Tran  ?MRN: [id number]  ?Epilepsy Attending: Lora Havens  ?Referring Physician/Provider: Lora Havens, MD ?Duration: 05/12/2021 1403 to 05/13/2021 1403 ?  ?Patient history: 47yo F with h/o epilepsy now with ams. EEG to evaluate for seizure. ?  ?Level of alertness: lethargic ?  ?AEDs during EEG study: VPA, Propofol, ZNS ?  ?Technical aspects: This EEG study was done with scalp electrodes positioned according to the 10-20 International system of electrode placement. Electrical activity was acquired at a sampling rate of '500Hz'$  and reviewed with a high frequency filter of '70Hz'$  and a low frequency filter of '1Hz'$ . EEG data were recorded continuously and digitally stored.  ?  ?Description: EEG showed continuous generalized predominantly 5- 8 Hz theta -alpha activity admixed with intermittent generalized 2-'3Hz'$  delta slowing. Hyperventilation and photic stimulation were not performed.    ?  ?ABNORMALITY ?- Continuous slow, generalized ?  ?IMPRESSION: ?This study is suggestive of moderate diffuse encephalopathy, nonspecific etiology. No seizures or epileptiform discharges were seen throughout the recording. ?  ?Lora Havens  ?

## 2021-05-13 NOTE — Progress Notes (Signed)
Pt not withdrawing to pain in all extremeties but will move R>L when coughing. Dr. Milas Gain aware, stat CT ordered.  ?

## 2021-05-14 ENCOUNTER — Inpatient Hospital Stay (HOSPITAL_COMMUNITY): Payer: No Typology Code available for payment source

## 2021-05-14 DIAGNOSIS — C7A8 Other malignant neuroendocrine tumors: Secondary | ICD-10-CM | POA: Diagnosis not present

## 2021-05-14 DIAGNOSIS — R569 Unspecified convulsions: Secondary | ICD-10-CM | POA: Diagnosis not present

## 2021-05-14 DIAGNOSIS — R4689 Other symptoms and signs involving appearance and behavior: Secondary | ICD-10-CM | POA: Diagnosis not present

## 2021-05-14 LAB — BASIC METABOLIC PANEL
Anion gap: 10 (ref 5–15)
BUN: 18 mg/dL (ref 6–20)
CO2: 29 mmol/L (ref 22–32)
Calcium: 7.8 mg/dL — ABNORMAL LOW (ref 8.9–10.3)
Chloride: 108 mmol/L (ref 98–111)
Creatinine, Ser: 0.71 mg/dL (ref 0.44–1.00)
GFR, Estimated: >60 mL/min (ref >60)
Glucose, Bld: 120 mg/dL — ABNORMAL HIGH (ref 70–99)
Potassium: 3.5 mmol/L (ref 3.5–5.1)
Sodium: 147 mmol/L — ABNORMAL HIGH (ref 135–145)

## 2021-05-14 LAB — CBC
HCT: 31 % — ABNORMAL LOW (ref 36.0–46.0)
Hemoglobin: 9.8 g/dL — ABNORMAL LOW (ref 12.0–15.0)
MCH: 28.9 pg (ref 26.0–34.0)
MCHC: 31.6 g/dL (ref 30.0–36.0)
MCV: 91.4 fL (ref 80.0–100.0)
Platelets: 274 10*3/uL (ref 150–400)
RBC: 3.39 MIL/uL — ABNORMAL LOW (ref 3.87–5.11)
RDW: 15 % (ref 11.5–15.5)
WBC: 17.8 10*3/uL — ABNORMAL HIGH (ref 4.0–10.5)
nRBC: 0.1 % (ref 0.0–0.2)

## 2021-05-14 LAB — POCT I-STAT 7, (LYTES, BLD GAS, ICA,H+H)
Acid-Base Excess: 6 mmol/L — ABNORMAL HIGH (ref 0.0–2.0)
Bicarbonate: 31.5 mmol/L — ABNORMAL HIGH (ref 20.0–28.0)
Calcium, Ion: 1.07 mmol/L — ABNORMAL LOW (ref 1.15–1.40)
HCT: 30 % — ABNORMAL LOW (ref 36.0–46.0)
Hemoglobin: 10.2 g/dL — ABNORMAL LOW (ref 12.0–15.0)
O2 Saturation: 93 %
Patient temperature: 98.6
Potassium: 3.5 mmol/L (ref 3.5–5.1)
Sodium: 148 mmol/L — ABNORMAL HIGH (ref 135–145)
TCO2: 33 mmol/L — ABNORMAL HIGH (ref 22–32)
pCO2 arterial: 47.5 mmHg (ref 32–48)
pH, Arterial: 7.43 (ref 7.35–7.45)
pO2, Arterial: 65 mmHg — ABNORMAL LOW (ref 83–108)

## 2021-05-14 LAB — GLUCOSE, CAPILLARY
Glucose-Capillary: 110 mg/dL — ABNORMAL HIGH (ref 70–99)
Glucose-Capillary: 116 mg/dL — ABNORMAL HIGH (ref 70–99)
Glucose-Capillary: 121 mg/dL — ABNORMAL HIGH (ref 70–99)
Glucose-Capillary: 127 mg/dL — ABNORMAL HIGH (ref 70–99)
Glucose-Capillary: 138 mg/dL — ABNORMAL HIGH (ref 70–99)

## 2021-05-14 LAB — AMMONIA: Ammonia: 67 umol/L — ABNORMAL HIGH (ref 9–35)

## 2021-05-14 LAB — PHOSPHORUS: Phosphorus: 2.7 mg/dL (ref 2.5–4.6)

## 2021-05-14 LAB — MRSA NEXT GEN BY PCR, NASAL: MRSA by PCR Next Gen: NOT DETECTED

## 2021-05-14 LAB — MAGNESIUM: Magnesium: 2.6 mg/dL — ABNORMAL HIGH (ref 1.7–2.4)

## 2021-05-14 MED ORDER — TRAVASOL 10 % IV SOLN
INTRAVENOUS | Status: AC
  Filled 2021-05-14: qty 528

## 2021-05-14 MED ORDER — CHLORHEXIDINE GLUCONATE CLOTH 2 % EX PADS
6.0000 | MEDICATED_PAD | Freq: Every day | CUTANEOUS | Status: DC
  Administered 2021-05-15 – 2021-06-15 (×34): 6 via TOPICAL

## 2021-05-14 MED ORDER — INSULIN ASPART 100 UNIT/ML IJ SOLN
0.0000 [IU] | Freq: Four times a day (QID) | INTRAMUSCULAR | Status: DC
  Administered 2021-05-15 (×4): 1 [IU] via SUBCUTANEOUS
  Administered 2021-05-16: 2 [IU] via SUBCUTANEOUS
  Administered 2021-05-16: 1 [IU] via SUBCUTANEOUS

## 2021-05-14 NOTE — Progress Notes (Addendum)
PHARMACY - TOTAL PARENTERAL NUTRITION CONSULT NOTE  ? ?Indication:  intolerance to tube feeds  ? ?Patient Measurements: ?Height: '5\' 3"'$  (160 cm) ?Weight: 96.8 kg (213 lb 6.5 oz) ?IBW/kg (Calculated) : 52.4 ?TPN AdjBW (KG): 62.6 ?Body mass index is 37.8 kg/m?. ?Usual Weight: 96.8 kg ? 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  Cortrak placed on 05/11/2021. Emesis started 05/13/2021. TPN order received 05/14/2021 ? ?Glucose / Insulin: no history of DM noted. No insulin ordered prior to TPN. CBG's 116-175 w/in last 24 hours ?Electrolytes: Na 147; K 3.5; Ch 108; iCa 1.07; Mag 2.6; Phos 2.7; Ammonia 67 ?Renal: Scr <1 ?Hepatic: none ?TG: 579- on propofol infusion from 05/12/2021 AM to 05/13/2021 AM. Now discontinued. ?Intake / Output: +7.4L  ?MIVF: none ?LBM- 3/18 ?GI Imaging: ?3/16- CTA shows extensive bibasilar heterogenous airspace opacity and consolidation consistent w/ infection or aspiration ?  ?GI Surgeries / Procedures:  ?05/05/2021- exploratory laparotomy, right hemicolectomy; cholecystectomy ? ?Central access:Triple lumen (CVC) placed 05/12/2021 ?TPN start date: 05/14/2021 ? ?Nutritional Goals: ?Goal TPN rate is 80 mL/hr (provides 105 g of protein and 2112 kcals per day) ? ?RD Assessment: ?Estimated Needs ?Total Energy Estimated Needs: 2000-2200 ?Total Protein Estimated Needs: 100-115g ?Total Fluid Estimated Needs: >/=2L ? ?Current Nutrition:  ?TPN ? ?Plan:  ?Start TPN at 40 mL/hr at 1800 ?Electrolytes in TPN: Na 25 mEq/L, K 50 mEq/L, Ca 51mq/L, Mg 2 mEq/L, and Phos 15 mmol/L. Cl:Ac 1:1 ?Add standard MVI and trace elements to TPN ?Withhold lipids from first bag until triglycerides are resulted 05/15/2021 ?Initiate Sensitive q6h SSI and adjust as needed  ?Monitor TPN labs on Mon/Thurs, prn ? ?Lionel Woodberry BS, PharmD, BCPS ?Clinical Pharmacist ?05/14/2021 9:21 AM ? ?

## 2021-05-14 NOTE — Progress Notes (Signed)
Patient ID: Cassidy Tran, female   DOB: [birth year], 46 y.o.   MRN: [id number] ?Vital signs stable temperature low-grade ?Patient has some fluttering eye movements ?Has open eyes on 1 or 2 occasions ?Continues to respiratory supportive care ?Offered supportive care to the family ? ?

## 2021-05-14 NOTE — Progress Notes (Signed)
Subjective: ?Continues to be encephaloapthic. LP negative ? ?Exam: ?Vitals:  ? 05/14/21 1200 05/14/21 1300  ?BP: 137/64 134/71  ?Pulse: 93 94  ?Resp: 18 (!) 21  ?Temp:    ?SpO2: 93% 94%  ? ?Gen: In bed, NAD ?Resp: non-labored breathing, no acute distress ?Abd: soft, nt ? ?Neuro: ?MS: opens eyes to noxious stimulation, but does not follow commands.  ?CN: PERRL, blinks to threat from the right, not consistent from the left.  ?Motor: She withdraws to noxious stimulation in all four extremities ?Sensory: As above ? ?Pertinent Labs: ?Ammonia 67 ? ?Impression: 46 year old female with a history of epilepsy associated with isolated "jerks" history of generalized discharges on EEG.  She has not had a fully convulsive seizure since 2010, but is still has occasional myoclonus.  She was admitted for abdominal surgery and in her postoperative course became confused.  Despite initially reporting compliance with medications, on further review it appears she was not taking her medications reliably.  She was taking her Zonegran more frequently than her Depakote, but unclear how often she was taking her Depakote. ? ?Her ammonia today is downtrending. She is better today, and  I would expect slow continued improvement over the next few days.  ? ?Recommendations: ?1) Continue levetiracetam 750 twice daily ?2) can discontinue LTM EEG ?3) Will continue to follow.  ? ?Roland Rack, MD ?[facility] ?[phone] ? ?If 7pm- 7am, please page neurology on call as listed in [place].' ?

## 2021-05-14 NOTE — Progress Notes (Signed)
? ?Trauma/Critical Care Follow Up Note ? ?Subjective:  ?  ?Overnight Issues:  ? ?Objective:  ?Vital signs for last 24 hours: ?Temp:  [98.9 ?F (37.2 ?C)-99.5 ?F (37.5 ?C)] 98.9 ?F (37.2 ?C) (03/18 0800) ?Pulse Rate:  [85-102] 96 (03/18 0800) ?Resp:  [17-23] 22 (03/18 0800) ?BP: (110-144)/(51-63) 141/56 (03/18 0800) ?SpO2:  [92 %-100 %] 93 % (03/18 0800) ?Arterial Line BP: (118-171)/(43-71) 165/64 (03/18 0800) ?FiO2 (%):  [60 %] 60 % (03/18 0752) ? ?Hemodynamic parameters for last 24 hours: ?  ? ?Intake/Output from previous day: ?03/17 0701 - 03/18 0700 ?In: 2706.6 [I.V.:175.6; NG/GT:1190; IV Piggyback:1341] ?Out: 1275 [Urine:475; Stool:800]  ?Intake/Output this shift: ?Total I/O ?In: 102.5 [I.V.:44.8; IV Piggyback:57.7] ?Out: -  ? ?Vent settings for last 24 hours: ?Vent Mode: PRVC ?FiO2 (%):  [60 %] 60 % ?Set Rate:  [18 bmp] 18 bmp ?Vt Set:  [500 mL] 500 mL ?PEEP:  [8 cmH20] 8 cmH20 ?Plateau Pressure:  [25 cmH20-33 cmH20] 32 cmH20 ? ?Physical Exam:  ?Gen: comfortable, no distress ?Neuro: not f/c ?HEENT: PERRL ?Neck: supple ?CV: RRR ?Pulm: unlabored breathing on MV ?Abd: soft, NT, midline incision c/d/i ?GU: clear yellow urine ?Extr: wwp, no edema ? ? ?Results for orders placed or performed during the hospital encounter of 05/05/21 (from the past 24 hour(s))  ?Glucose, capillary     Status: Abnormal  ? Collection Time: 05/13/21 12:26 PM  ?Result Value Ref Range  ? Glucose-Capillary 175 (H) 70 - 99 mg/dL  ?Glucose, capillary     Status: Abnormal  ? Collection Time: 05/13/21  4:23 PM  ?Result Value Ref Range  ? Glucose-Capillary 160 (H) 70 - 99 mg/dL  ?Basic metabolic panel     Status: Abnormal  ? Collection Time: 05/13/21  6:02 PM  ?Result Value Ref Range  ? Sodium 145 135 - 145 mmol/L  ? Potassium 4.1 3.5 - 5.1 mmol/L  ? Chloride 107 98 - 111 mmol/L  ? CO2 29 22 - 32 mmol/L  ? Glucose, Bld 149 (H) 70 - 99 mg/dL  ? BUN 18 6 - 20 mg/dL  ? Creatinine, Ser 0.86 0.44 - 1.00 mg/dL  ? Calcium 7.7 (L) 8.9 - 10.3 mg/dL  ?  GFR, Estimated >60 >60 mL/min  ? Anion gap 9 5 - 15  ?Phosphorus     Status: None  ? Collection Time: 05/13/21  6:02 PM  ?Result Value Ref Range  ? Phosphorus 3.2 2.5 - 4.6 mg/dL  ?Glucose, capillary     Status: Abnormal  ? Collection Time: 05/13/21  7:41 PM  ?Result Value Ref Range  ? Glucose-Capillary 174 (H) 70 - 99 mg/dL  ?Glucose, capillary     Status: Abnormal  ? Collection Time: 05/13/21 11:14 PM  ?Result Value Ref Range  ? Glucose-Capillary 133 (H) 70 - 99 mg/dL  ?Glucose, capillary     Status: Abnormal  ? Collection Time: 05/14/21  3:17 AM  ?Result Value Ref Range  ? Glucose-Capillary 121 (H) 70 - 99 mg/dL  ?I-STAT 7, (LYTES, BLD GAS, ICA, H+H)     Status: Abnormal  ? Collection Time: 05/14/21  4:07 AM  ?Result Value Ref Range  ? pH, Arterial 7.430 7.35 - 7.45  ? pCO2 arterial 47.5 32 - 48 mmHg  ? pO2, Arterial 65 (L) 83 - 108 mmHg  ? Bicarbonate 31.5 (H) 20.0 - 28.0 mmol/L  ? TCO2 33 (H) 22 - 32 mmol/L  ? O2 Saturation 93 %  ? Acid-Base Excess 6.0 (H) 0.0 - 2.0  mmol/L  ? Sodium 148 (H) 135 - 145 mmol/L  ? Potassium 3.5 3.5 - 5.1 mmol/L  ? Calcium, Ion 1.07 (L) 1.15 - 1.40 mmol/L  ? HCT 30.0 (L) 36.0 - 46.0 %  ? Hemoglobin 10.2 (L) 12.0 - 15.0 g/dL  ? Patient temperature 98.6 F   ? Collection site art line   ? Drawn by Operator   ? Sample type ARTERIAL   ?CBC     Status: Abnormal  ? Collection Time: 05/14/21  5:38 AM  ?Result Value Ref Range  ? WBC 17.8 (H) 4.0 - 10.5 K/uL  ? RBC 3.39 (L) 3.87 - 5.11 MIL/uL  ? Hemoglobin 9.8 (L) 12.0 - 15.0 g/dL  ? HCT 31.0 (L) 36.0 - 46.0 %  ? MCV 91.4 80.0 - 100.0 fL  ? MCH 28.9 26.0 - 34.0 pg  ? MCHC 31.6 30.0 - 36.0 g/dL  ? RDW 15.0 11.5 - 15.5 %  ? Platelets 274 150 - 400 K/uL  ? nRBC 0.1 0.0 - 0.2 %  ?Basic metabolic panel     Status: Abnormal  ? Collection Time: 05/14/21  5:38 AM  ?Result Value Ref Range  ? Sodium 147 (H) 135 - 145 mmol/L  ? Potassium 3.5 3.5 - 5.1 mmol/L  ? Chloride 108 98 - 111 mmol/L  ? CO2 29 22 - 32 mmol/L  ? Glucose, Bld 120 (H) 70 - 99 mg/dL   ? BUN 18 6 - 20 mg/dL  ? Creatinine, Ser 0.71 0.44 - 1.00 mg/dL  ? Calcium 7.8 (L) 8.9 - 10.3 mg/dL  ? GFR, Estimated >60 >60 mL/min  ? Anion gap 10 5 - 15  ?Ammonia     Status: Abnormal  ? Collection Time: 05/14/21  5:38 AM  ?Result Value Ref Range  ? Ammonia 67 (H) 9 - 35 umol/L  ?Phosphorus     Status: None  ? Collection Time: 05/14/21  5:38 AM  ?Result Value Ref Range  ? Phosphorus 2.7 2.5 - 4.6 mg/dL  ?Magnesium     Status: Abnormal  ? Collection Time: 05/14/21  5:38 AM  ?Result Value Ref Range  ? Magnesium 2.6 (H) 1.7 - 2.4 mg/dL  ?Glucose, capillary     Status: Abnormal  ? Collection Time: 05/14/21  7:21 AM  ?Result Value Ref Range  ? Glucose-Capillary 116 (H) 70 - 99 mg/dL  ? ? ?Assessment & Plan: ?The plan of care was discussed with the bedside nurse for the day, who is in agreement with this plan and no additional concerns were raised.  ? ?Present on Admission: ? Neuroendocrine carcinoma ([place]) ? ? ? LOS: 9 days  ? ?Additional comments:I reviewed the patient's new clinical lab test results.   and I reviewed the patients new imaging test results.   ? ?Neuroendocrine tumor on colonoscopy - s/p exlap, R hemicolectomy, cholecystectomy 3/9. Well differentiated neuroendocrine tumor with 1/7 +nodes. D/w Dr. Marin Olp 3/15 will likely need adjuvant chemo. ?Respiratory failure 2/2 volume overload - CT chest 3/11 and 3/13 with no PE. Intubated 3/16, CXR with bibasilar airspace dz ?Volume overload - euvolemic now clinically, but persistent airspace disease ?Altered mental status - CT head 3/13, 3/16/ 3/17 normal (h/o VP shunt). Changed cefepime to zosyn 3/13, total 7d course-end today, empiric coverage for PNA. Resp cx today. Ucx 3/13 negative. Bcx 3/15 NGTD. LP 3/16 NGTD. CT A/P 3/16 negative. Elevated NH4, likely 2/2 initiation of depakote/zonegran therapy in-hospital when not taking at home (report of adherence per family). Neurology on board,  changed to keppra, cont EEG without sz, remains encephalopathic. ?vWD -  humate P per Heme, end 3/12, hypercoagulable on TEG 3/13 ?FEN - NPO, OGT, TF off 2/2 emesis, start TPN, likely had an element of refeeding syndrome, repleted lytes yest ?DVT - SCDs, s/p DDVAP, LMWH, LE duplex neg for DVT 3/13 ?Foley - replaced 3/13, removed 3/17, req I/O, may need to be replaced, cont strict IO ?Dispo - ICU ?  ?Clinical update provided to patient's husband at bedside. ? ?Critical Care Total Time: 40 minutes ? ?[name], MD ?Trauma & General Surgery ?Please use [email or web address] to contact on call provider ? ?05/14/2021 ? ?*Care during the described time interval was provided by me. I have reviewed this patient's available data, including medical history, events of note, physical examination and test results as part of my evaluation. ? ? ? ?

## 2021-05-14 NOTE — Progress Notes (Signed)
LTM EEG discontinued per Dr. Leonel Ramsay- no skin breakdown at [facility].  ?

## 2021-05-14 NOTE — Procedures (Addendum)
Patient Name: Cassidy Tran  ?MRN: [id number]  ?Epilepsy Attending: Lora Havens  ?Referring Physician/Provider: Lora Havens, MD ?Duration: 05/13/2021 1403 to 05/14/2021 1355 ?  ?Patient history: 46yo F with h/o epilepsy now with ams. EEG to evaluate for seizure. ?  ?Level of alertness: awake, asleep ?  ?AEDs during EEG study: LEV ?  ?Technical aspects: This EEG study was done with scalp electrodes positioned according to the 10-20 International system of electrode placement. Electrical activity was acquired at a sampling rate of '500Hz'$  and reviewed with a high frequency filter of '70Hz'$  and a low frequency filter of '1Hz'$ . EEG data were recorded continuously and digitally stored.  ?  ?Description: The posterior dominant rhythm consists of 8-9 Hz activity of moderate voltage (25-35 uV) seen predominantly in posterior head regions, symmetric and reactive to eye opening and eye closing. Sleep was characterized by vertex waves, sleep spindles (12 to 14 Hz), maximal frontocentral region. EEG showed continuous generalized predominantly 5- 8 Hz theta -alpha activity admixed with intermittent generalized 2-'3Hz'$  delta slowing. Hyperventilation and photic stimulation were not performed.    ?  ?ABNORMALITY ?- Continuous slow, generalized ?  ?IMPRESSION: ?This study is suggestive of moderate diffuse encephalopathy, nonspecific etiology. No seizures or epileptiform discharges were seen throughout the recording. ?  ?Lora Havens  ? ?

## 2021-05-15 DIAGNOSIS — G9340 Encephalopathy, unspecified: Secondary | ICD-10-CM | POA: Diagnosis not present

## 2021-05-15 LAB — COMPREHENSIVE METABOLIC PANEL
ALT: 19 U/L (ref 0–44)
AST: 38 U/L (ref 15–41)
Albumin: 2.1 g/dL — ABNORMAL LOW (ref 3.5–5.0)
Alkaline Phosphatase: 137 U/L — ABNORMAL HIGH (ref 38–126)
Anion gap: 7 (ref 5–15)
BUN: 18 mg/dL (ref 6–20)
CO2: 31 mmol/L (ref 22–32)
Calcium: 7.9 mg/dL — ABNORMAL LOW (ref 8.9–10.3)
Chloride: 108 mmol/L (ref 98–111)
Creatinine, Ser: 0.59 mg/dL (ref 0.44–1.00)
GFR, Estimated: >60 mL/min (ref >60)
Glucose, Bld: 143 mg/dL — ABNORMAL HIGH (ref 70–99)
Potassium: 3.4 mmol/L — ABNORMAL LOW (ref 3.5–5.1)
Sodium: 146 mmol/L — ABNORMAL HIGH (ref 135–145)
Total Bilirubin: 0.4 mg/dL (ref 0.3–1.2)
Total Protein: 6.4 g/dL — ABNORMAL LOW (ref 6.5–8.1)

## 2021-05-15 LAB — CBC
HCT: 30.1 % — ABNORMAL LOW (ref 36.0–46.0)
Hemoglobin: 9.4 g/dL — ABNORMAL LOW (ref 12.0–15.0)
MCH: 28.6 pg (ref 26.0–34.0)
MCHC: 31.2 g/dL (ref 30.0–36.0)
MCV: 91.5 fL (ref 80.0–100.0)
Platelets: 250 10*3/uL (ref 150–400)
RBC: 3.29 MIL/uL — ABNORMAL LOW (ref 3.87–5.11)
RDW: 15.1 % (ref 11.5–15.5)
WBC: 19.3 10*3/uL — ABNORMAL HIGH (ref 4.0–10.5)
nRBC: 0 % (ref 0.0–0.2)

## 2021-05-15 LAB — GLUCOSE, CAPILLARY
Glucose-Capillary: 147 mg/dL — ABNORMAL HIGH (ref 70–99)
Glucose-Capillary: 144 mg/dL — ABNORMAL HIGH (ref 70–99)
Glucose-Capillary: 144 mg/dL — ABNORMAL HIGH (ref 70–99)
Glucose-Capillary: 142 mg/dL — ABNORMAL HIGH (ref 70–99)

## 2021-05-15 LAB — CSF CULTURE W GRAM STAIN
Culture: NO GROWTH
Gram Stain: NONE SEEN

## 2021-05-15 LAB — TRIGLYCERIDES: Triglycerides: 304 mg/dL — ABNORMAL HIGH (ref <150)

## 2021-05-15 LAB — HEMOGLOBIN A1C
Hgb A1c MFr Bld: 6.1 % — ABNORMAL HIGH (ref 4.8–5.6)
Mean Plasma Glucose: 128.37 mg/dL

## 2021-05-15 LAB — MAGNESIUM: Magnesium: 2.5 mg/dL — ABNORMAL HIGH (ref 1.7–2.4)

## 2021-05-15 LAB — AMMONIA: Ammonia: 92 umol/L — ABNORMAL HIGH (ref 9–35)

## 2021-05-15 LAB — PHOSPHORUS: Phosphorus: 2.4 mg/dL — ABNORMAL LOW (ref 2.5–4.6)

## 2021-05-15 MED ORDER — POTASSIUM CHLORIDE 20 MEQ PO PACK
40.0000 meq | PACK | ORAL | Status: AC
  Administered 2021-05-15 (×3): 40 meq
  Filled 2021-05-15 (×3): qty 2

## 2021-05-15 MED ORDER — TRAVASOL 10 % IV SOLN
INTRAVENOUS | Status: DC
  Filled 2021-05-15: qty 619.2

## 2021-05-15 MED ORDER — POTASSIUM PHOSPHATES 15 MMOLE/5ML IV SOLN
30.0000 mmol | Freq: Once | INTRAVENOUS | Status: AC
  Administered 2021-05-15: 30 mmol via INTRAVENOUS
  Filled 2021-05-15: qty 10

## 2021-05-15 MED ORDER — POTASSIUM CHLORIDE 20 MEQ PO PACK: 40.0000 meq | PACK | ORAL | Status: DC

## 2021-05-15 MED ORDER — TRAVASOL 10 % IV SOLN
INTRAVENOUS | Status: AC
  Filled 2021-05-15: qty 619.2

## 2021-05-15 MED ORDER — WHITE PETROLATUM EX OINT
TOPICAL_OINTMENT | CUTANEOUS | Status: DC | PRN
  Administered 2021-06-04: 1 via TOPICAL
  Filled 2021-05-15 (×2): qty 28.35

## 2021-05-15 NOTE — Progress Notes (Signed)
Patient ID: Cassidy Tran, female   DOB: 09-Dec-1975, 46 y.o.   MRN: [id number] ?Vital signs are stable she seems a bit later today and flickers her eyes and even opens them briefly to loud voice.  Nurses note that she has followed commands. ?Continue supportive care and I have offered support for the parents. ?

## 2021-05-15 NOTE — Progress Notes (Signed)
? ?Trauma/Critical Care Follow Up Note ? ?Subjective:  ?  ?Overnight Issues:  ? ?Objective:  ?Vital signs for last 24 hours: ?Temp:  [98.5 ?F (36.9 ?C)-100.1 ?F (37.8 ?C)] 98.5 ?F (36.9 ?C) (03/19 0319) ?Pulse Rate:  [79-109] 98 (03/19 0800) ?Resp:  [10-26] 23 (03/19 0800) ?BP: (120-159)/(45-78) 143/62 (03/19 0800) ?SpO2:  [83 %-98 %] 92 % (03/19 0800) ?Arterial Line BP: (155-216)/(64-122) 179/74 (03/19 0800) ?FiO2 (%):  [50 %-60 %] 50 % (03/19 0755) ? ?Hemodynamic parameters for last 24 hours: ?  ? ?Intake/Output from previous day: ?03/18 0701 - 03/19 0700 ?In: 976.5 [I.V.:675.3; IV Piggyback:301.2] ?Out: 1504 [Urine:1254; Stool:250]  ?Intake/Output this shift: ?Total I/O ?In: 10 [I.V.:10] ?Out: 200 [Urine:200] ? ?Vent settings for last 24 hours: ?Vent Mode: PRVC ?FiO2 (%):  [50 %-60 %] 50 % ?Set Rate:  [18 bmp] 18 bmp ?Vt Set:  [500 mL] 500 mL ?PEEP:  [8 cmH20] 8 cmH20 ?Plateau Pressure:  [25 cmH20-30 cmH20] 30 cmH20 ? ?Physical Exam:  ?Gen: comfortable, no distress ?Neuro: f/c ?HEENT: PERRL ?Neck: supple ?CV: RRR ?Pulm: unlabored breathing ?Abd: soft, NT ?GU: clear yellow urine ?Extr: wwp, no edema ? ? ?Results for orders placed or performed during the hospital encounter of 05/05/21 (from the past 24 hour(s))  ?Culture, Respiratory w Gram Stain     Status: None (Preliminary result)  ? Collection Time: 05/14/21  9:20 AM  ? Specimen: Tracheal Aspirate; Respiratory  ?Result Value Ref Range  ? Specimen Description TRACHEAL ASPIRATE   ? Special Requests NONE   ? Gram Stain    ?  RARE WBC PRESENT,BOTH PMN AND MONONUCLEAR ?NO ORGANISMS SEEN ?Performed at [facility], [place] [address], [place], [place] [address] ?  ? Culture PENDING   ? Report Status PENDING   ?MRSA Next Gen by PCR, Nasal     Status: None  ? Collection Time: 05/14/21  9:28 AM  ? Specimen: Nasal Mucosa; Nasal Swab  ?Result Value Ref Range  ? MRSA by PCR Next Gen NOT DETECTED NOT DETECTED  ?Glucose, capillary     Status: Abnormal  ? Collection Time:  05/14/21 11:15 AM  ?Result Value Ref Range  ? Glucose-Capillary 127 (H) 70 - 99 mg/dL  ?Glucose, capillary     Status: Abnormal  ? Collection Time: 05/14/21  3:19 PM  ?Result Value Ref Range  ? Glucose-Capillary 110 (H) 70 - 99 mg/dL  ?Glucose, capillary     Status: Abnormal  ? Collection Time: 05/14/21 11:43 PM  ?Result Value Ref Range  ? Glucose-Capillary 138 (H) 70 - 99 mg/dL  ? Comment 1 Notify RN   ? Comment 2 Document in Chart   ?CBC     Status: Abnormal  ? Collection Time: 05/15/21  5:41 AM  ?Result Value Ref Range  ? WBC 19.3 (H) 4.0 - 10.5 K/uL  ? RBC 3.29 (L) 3.87 - 5.11 MIL/uL  ? Hemoglobin 9.4 (L) 12.0 - 15.0 g/dL  ? HCT 30.1 (L) 36.0 - 46.0 %  ? MCV 91.5 80.0 - 100.0 fL  ? MCH 28.6 26.0 - 34.0 pg  ? MCHC 31.2 30.0 - 36.0 g/dL  ? RDW 15.1 11.5 - 15.5 %  ? Platelets 250 150 - 400 K/uL  ? nRBC 0.0 0.0 - 0.2 %  ?Ammonia     Status: Abnormal  ? Collection Time: 05/15/21  5:41 AM  ?Result Value Ref Range  ? Ammonia 92 (H) 9 - 35 umol/L  ?Comprehensive metabolic panel  Status: Abnormal  ? Collection Time: 05/15/21  5:41 AM  ?Result Value Ref Range  ? Sodium 146 (H) 135 - 145 mmol/L  ? Potassium 3.4 (L) 3.5 - 5.1 mmol/L  ? Chloride 108 98 - 111 mmol/L  ? CO2 31 22 - 32 mmol/L  ? Glucose, Bld 143 (H) 70 - 99 mg/dL  ? BUN 18 6 - 20 mg/dL  ? Creatinine, Ser 0.59 0.44 - 1.00 mg/dL  ? Calcium 7.9 (L) 8.9 - 10.3 mg/dL  ? Total Protein 6.4 (L) 6.5 - 8.1 g/dL  ? Albumin 2.1 (L) 3.5 - 5.0 g/dL  ? AST 38 15 - 41 U/L  ? ALT 19 0 - 44 U/L  ? Alkaline Phosphatase 137 (H) 38 - 126 U/L  ? Total Bilirubin 0.4 0.3 - 1.2 mg/dL  ? GFR, Estimated >60 >60 mL/min  ? Anion gap 7 5 - 15  ?Magnesium     Status: Abnormal  ? Collection Time: 05/15/21  5:41 AM  ?Result Value Ref Range  ? Magnesium 2.5 (H) 1.7 - 2.4 mg/dL  ?Phosphorus     Status: Abnormal  ? Collection Time: 05/15/21  5:41 AM  ?Result Value Ref Range  ? Phosphorus 2.4 (L) 2.5 - 4.6 mg/dL  ?Triglycerides     Status: Abnormal  ? Collection Time: 05/15/21  5:41 AM  ?Result  Value Ref Range  ? Triglycerides 304 (H) <150 mg/dL  ?Hemoglobin A1c     Status: Abnormal  ? Collection Time: 05/15/21  5:41 AM  ?Result Value Ref Range  ? Hgb A1c MFr Bld 6.1 (H) 4.8 - 5.6 %  ? Mean Plasma Glucose 128.37 mg/dL  ?Glucose, capillary     Status: Abnormal  ? Collection Time: 05/15/21  5:52 AM  ?Result Value Ref Range  ? Glucose-Capillary 147 (H) 70 - 99 mg/dL  ? Comment 1 Notify RN   ? Comment 2 Document in Chart   ? ? ?Assessment & Plan: ?The plan of care was discussed with the bedside nurse for the day, who is in agreement with this plan and no additional concerns were raised.  ? ?Present on Admission: ? Neuroendocrine carcinoma ([place]) ? ? ? LOS: 10 days  ? ?Additional comments:I reviewed the patient's new clinical lab test results.   and I reviewed the patients new imaging test results.   ? ?Neuroendocrine tumor on colonoscopy - s/p exlap, R hemicolectomy, cholecystectomy 3/9. Well differentiated neuroendocrine tumor with 1/7 +nodes. D/w Dr. Marin Olp 3/15 will likely need adjuvant chemo. ?Respiratory failure 2/2 volume overload - CT chest 3/11 and 3/13 with no PE. Intubated 3/16, CXR with bibasilar airspace dz ?Volume overload - euvolemic now clinically, but persistent airspace disease ?Altered mental status - CT head 3/13, 3/16/ 3/17 normal (h/o VP shunt). Changed cefepime to zosyn 3/13, total 7d course-end today, empiric coverage for PNA. Resp cx today. Ucx 3/13 negative. Bcx 3/15 NGTD. LP 3/16 NGTD. CT A/P 3/16 negative. 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 Neurology on board, changed to keppra, cont EEG without sz, encephalopathy improving, d/c imodium to encourage NH4 loss. May add lactulose also ?vWD - humate P per Heme, end 3/12, hypercoagulable on TEG 3/13 ?FEN - NPO, OGT, TF off 2/2 emesis, start TPN, likely had an element of refeeding syndrome, repleted lytes yest ?DVT - SCDs, s/p DDVAP, LMWH, LE duplex neg for DVT 3/13 ?Foley - replaced 3/18 ?Dispo - ICU ?   ? ?Critical Care Total Time: 50 minutes ? ?[name], MD ?Trauma & General  Surgery ?Please use [email or web address] to contact on call provider ? ?05/15/2021 ? ?*Care during the described time interval was provided by me. I have reviewed this patient's available data, including medical history, events of note, physical examination and test results as part of my evaluation. ? ? ? ?

## 2021-05-15 NOTE — Progress Notes (Signed)
Subjective: ?Slightly better today ? ?Exam: ?Vitals:  ? 05/15/21 0900 05/15/21 1000  ?BP: (!) 125/50 (!) 111/45  ?Pulse: 91 90  ?Resp: 18 17  ?Temp:    ?SpO2: 92% 94%  ? ?Gen: In bed, NAD ?Resp: non-labored breathing, no acute distress ?Abd: soft, nt ? ?Neuro: ?MS: opens eyes to minimal stimulation, she wiggles toes to command, but requires repeated prompting. ?CN: PERRL, blinks to threat bilaterally ?Motor: She withdraws to noxious stimulation in all four extremities ?Sensory: As above ? ?Pertinent Labs: ?Ammonia 92 ? ?Impression: 46 year old female with a history of epilepsy associated with isolated "jerks" history of generalized discharges on EEG.  She has not had a fully convulsive seizure since 2010, but is still has occasional myoclonus.  She was admitted for abdominal surgery and in her postoperative course became confused.  Despite initially reporting compliance with medications, on further review it appears she was not taking her medications reliably.  She was taking her Zonegran more frequently than her Depakote, but unclear how often she was taking her Depakote. ? ?Her ammonia today is back up today but her encephalopathy is improving.  I continue to suspect that this is hyperammonemic encephalopathy, could consider lactulose or other means to help lower the ammonia per primary team. ? ?Recommendations: ?1) Continue levetiracetam 750 twice daily, would consider zonegran monotherapy on discharge.  ?2) Will continue to follow.  ? ?Roland Rack, MD ?[facility] ?[phone] ? ?If 7pm- 7am, please page neurology on call as listed in [place].' ?

## 2021-05-15 NOTE — Progress Notes (Addendum)
PHARMACY - TOTAL PARENTERAL NUTRITION CONSULT NOTE  ? ?Indication:  intolerance to tube feeds  ? ?Patient Measurements: ?Height: '5\' 3"'$  (160 cm) ?Weight: 96.8 kg (213 lb 6.5 oz) ?IBW/kg (Calculated) : 52.4 ?TPN AdjBW (KG): 62.6 ?Body mass index is 37.8 kg/m?. ?Usual Weight: 96.8 kg ? 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  Cortrak placed on 05/11/2021. Emesis started 05/13/2021. TPN order received 05/14/2021 ? ?Glucose / Insulin: CBG's 110-147 over the past 24 hours. 2 units of iSS administered over the past 24 hours. No history of DM noted. No insulin ordered in TPN. ?Electrolytes: Na 146; K 3.4 (goal >4); Ch 108; CoCa 9.42; Mag 2.5; Phos 2.4; Ammonia 92 ?Renal: Scr <1 ?Hepatic: none ?TG: 304 ?Intake / Output: +7.4L  ?MIVF: none ?LBM- 3/18 ?GI Imaging: ?3/16- CTA shows extensive bibasilar heterogenous airspace opacity and consolidation consistent w/ infection or aspiration ?  ?GI Surgeries / Procedures:  ?05/05/2021- exploratory laparotomy, right hemicolectomy; cholecystectomy ? ?Central access:Triple lumen (CVC) placed 05/12/2021 ?TPN start date: 05/14/2021 ? ?Nutritional Goals: ?Goal TPN rate is 80 mL/hr (provides 83 gram of protein and 1636 kcals per day- without the addition of lipids) ?Prosource TF three times a day (providing 33 grams of protein and 120 kcals per day ? ?Total 24 hour nutrition provided at goal rate (80 ml/hr): ?Protein: 116 grams ?Kcal: 1756 ? ?Energy needs will be within goal range once lipids are added to TPN ?*addition of lipids 20% will adjust provided kcal per day to 2020 from TPN* ? ?RD Assessment: ?Estimated Needs ?Total Energy Estimated Needs: 2000-2200 ?Total Protein Estimated Needs: 100-115g ?Total Fluid Estimated Needs: >/=2L ? ?Current Nutrition:  ?TPN and prosource TF (1 packet TID) ? ?Plan:  ?Increase TPN to 60 mL/hr at 1800 ?Electrolytes in TPN: Na 20 mEq/L, K 60 mEq/L,  Ca 47mq/L, remove Mg, and Phos 15 mmol/L. Cl:Ac 1:1 ?Add standard MVI and trace elements to TPN ?Withhold lipids from second bag. TG are above 300. Propfol discontinued 05/13/2021. Weekly TG on 3/20. Will follow-up with lipids at this time ?K 40 mEq PT X3 doses ?Kphos 30 mmol iv X1 ?Initiate Sensitive q6h SSI and adjust as needed  ?Monitor TPN labs on Mon/Thurs, prn ? ?Izaiah Tabb BS, PharmD, BCPS ?Clinical Pharmacist ?05/15/2021 7:59 AM ? ?

## 2021-05-15 NOTE — Progress Notes (Signed)
PHARMACY - TOTAL PARENTERAL NUTRITION CONSULT NOTE  ? ?Indication:  intolerance to tube feeds  ? ?Patient Measurements: ?Height: '5\' 3"'$  (160 cm) ?Weight: 96.8 kg (213 lb 6.5 oz) ?IBW/kg (Calculated) : 52.4 ?TPN AdjBW (KG): 62.6 ?Body mass index is 37.8 kg/m?. ?Usual Weight: 96.8 kg ? 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  Cortrak placed on 05/11/2021. Emesis started 05/13/2021. TPN order received 05/14/2021 ? ?Glucose / Insulin: CBG's 110-147 over the past 24 hours. 2 units of iSS administered over the past 24 hours. No history of DM noted. No insulin ordered in TPN. ?Electrolytes: Na 146; K 3.4 (goal >4); Ch 108; CoCa 9.42; Mag 2.5; Phos 2.4; Ammonia 92 ?Renal: Scr <1 ?Hepatic: none ?TG: 304 ?Intake / Output: +6.6L  ?MIVF: none ?LBM- 3/18 ?GI Imaging: ?3/16- CTA shows extensive bibasilar heterogenous airspace opacity and consolidation consistent w/ infection or aspiration ?  ?GI Surgeries / Procedures:  ?05/05/2021- exploratory laparotomy, right hemicolectomy; cholecystectomy ? ?Central access:Triple lumen (CVC) placed 05/12/2021 ?TPN start date: 05/14/2021 ? ?Nutritional Goals: ?Goal TPN rate is 80 mL/hr (provides 83 gram of protein and 1636 kcals per day- without the addition of lipids) ?Prosource TF three times a day (providing 33 grams of protein and 120 kcals per day ? ?Total 24 hour nutrition provided at goal rate (80 ml/hr): ?Protein: 116 grams ?Kcal: 1756 ? ?Energy needs will be within goal range once lipids are added to TPN ?*addition of lipids 20% will adjust provided kcal per day to 2020 from TPN* ? ?RD Assessment: ?Estimated Needs ?Total Energy Estimated Needs: 2000-2200 ?Total Protein Estimated Needs: 100-115g ?Total Fluid Estimated Needs: >/=2L ? ?Current Nutrition:  ?TPN and prosource TF (1 packet TID) ? ?Plan:  ?Increase TPN to 60 mL/hr at 1800 ?Electrolytes in TPN: Na 20 mEq/L, K 60 mEq/L,  Ca 34mq/L, remove Mg, and Phos 15 mmol/L. Cl:Ac 1:1 ?Add standard MVI and trace elements to TPN ?Withhold lipids from second bag. TG are above 300. Propfol discontinued 05/13/2021. Weekly TG on 3/20. Will follow-up with lipids at this time ?K 40 mEq PT X3 doses ?Kphos 30 mmol iv X1 ?Initiate Sensitive q6h SSI and adjust as needed  ?Monitor TPN labs on Mon/Thurs, prn ? ?Julious Langlois BS, PharmD, BCPS ?Clinical Pharmacist ?05/15/2021 8:48 AM ? ?

## 2021-05-16 ENCOUNTER — Inpatient Hospital Stay (HOSPITAL_COMMUNITY): Payer: No Typology Code available for payment source

## 2021-05-16 DIAGNOSIS — R569 Unspecified convulsions: Secondary | ICD-10-CM | POA: Diagnosis not present

## 2021-05-16 LAB — COMPREHENSIVE METABOLIC PANEL
ALT: 70 U/L — ABNORMAL HIGH (ref 0–44)
AST: 112 U/L — ABNORMAL HIGH (ref 15–41)
Albumin: 2.1 g/dL — ABNORMAL LOW (ref 3.5–5.0)
Alkaline Phosphatase: 175 U/L — ABNORMAL HIGH (ref 38–126)
Anion gap: 6 (ref 5–15)
BUN: 13 mg/dL (ref 6–20)
CO2: 28 mmol/L (ref 22–32)
Calcium: 8.1 mg/dL — ABNORMAL LOW (ref 8.9–10.3)
Chloride: 109 mmol/L (ref 98–111)
Creatinine, Ser: 0.61 mg/dL (ref 0.44–1.00)
GFR, Estimated: >60 mL/min (ref >60)
Glucose, Bld: 141 mg/dL — ABNORMAL HIGH (ref 70–99)
Potassium: 4.4 mmol/L (ref 3.5–5.1)
Sodium: 143 mmol/L (ref 135–145)
Total Bilirubin: 0.5 mg/dL (ref 0.3–1.2)
Total Protein: 6.9 g/dL (ref 6.5–8.1)

## 2021-05-16 LAB — CULTURE, BLOOD (ROUTINE X 2)
Culture: NO GROWTH
Culture: NO GROWTH

## 2021-05-16 LAB — GLUCOSE, CAPILLARY
Glucose-Capillary: 156 mg/dL — ABNORMAL HIGH (ref 70–99)
Glucose-Capillary: 148 mg/dL — ABNORMAL HIGH (ref 70–99)
Glucose-Capillary: 154 mg/dL — ABNORMAL HIGH (ref 70–99)
Glucose-Capillary: 183 mg/dL — ABNORMAL HIGH (ref 70–99)
Glucose-Capillary: 145 mg/dL — ABNORMAL HIGH (ref 70–99)

## 2021-05-16 LAB — PHOSPHORUS: Phosphorus: 3.5 mg/dL (ref 2.5–4.6)

## 2021-05-16 LAB — CULTURE, RESPIRATORY W GRAM STAIN

## 2021-05-16 LAB — AMMONIA: Ammonia: 66 umol/L — ABNORMAL HIGH (ref 9–35)

## 2021-05-16 LAB — MAGNESIUM: Magnesium: 2.2 mg/dL (ref 1.7–2.4)

## 2021-05-16 LAB — TRIGLYCERIDES: Triglycerides: 250 mg/dL — ABNORMAL HIGH (ref <150)

## 2021-05-16 MED ORDER — VITAL 1.5 CAL PO LIQD
1000.0000 mL | ORAL | Status: DC
  Administered 2021-05-16 – 2021-05-27 (×9): 1000 mL
  Filled 2021-05-16: qty 1000

## 2021-05-16 MED ORDER — VITAL HIGH PROTEIN PO LIQD
1000.0000 mL | ORAL | Status: DC
  Administered 2021-05-16: 1000 mL

## 2021-05-16 MED ORDER — INSULIN ASPART 100 UNIT/ML IJ SOLN
0.0000 [IU] | INTRAMUSCULAR | Status: DC
  Administered 2021-05-16: 2 [IU] via SUBCUTANEOUS
  Administered 2021-05-16: 1 [IU] via SUBCUTANEOUS
  Administered 2021-05-16 – 2021-05-17 (×5): 2 [IU] via SUBCUTANEOUS
  Administered 2021-05-17 (×2): 1 [IU] via SUBCUTANEOUS
  Administered 2021-05-18: 2 [IU] via SUBCUTANEOUS
  Administered 2021-05-18 – 2021-05-20 (×11): 1 [IU] via SUBCUTANEOUS
  Administered 2021-05-20: 2 [IU] via SUBCUTANEOUS
  Administered 2021-05-20 (×3): 1 [IU] via SUBCUTANEOUS
  Administered 2021-05-21: 2 [IU] via SUBCUTANEOUS
  Administered 2021-05-21 (×3): 1 [IU] via SUBCUTANEOUS
  Administered 2021-05-21: 2 [IU] via SUBCUTANEOUS
  Administered 2021-05-22 (×2): 1 [IU] via SUBCUTANEOUS
  Administered 2021-05-22: 2 [IU] via SUBCUTANEOUS
  Administered 2021-05-22: 1 [IU] via SUBCUTANEOUS
  Administered 2021-05-22: 2 [IU] via SUBCUTANEOUS
  Administered 2021-05-23 (×2): 1 [IU] via SUBCUTANEOUS
  Administered 2021-05-23: 2 [IU] via SUBCUTANEOUS
  Administered 2021-05-23 – 2021-05-24 (×8): 1 [IU] via SUBCUTANEOUS
  Administered 2021-05-25: 2 [IU] via SUBCUTANEOUS
  Administered 2021-05-25 (×2): 1 [IU] via SUBCUTANEOUS
  Administered 2021-05-25: 2 [IU] via SUBCUTANEOUS
  Administered 2021-05-25: 1 [IU] via SUBCUTANEOUS
  Administered 2021-05-25 – 2021-05-26 (×3): 2 [IU] via SUBCUTANEOUS
  Administered 2021-05-26 (×2): 1 [IU] via SUBCUTANEOUS
  Administered 2021-05-26 – 2021-05-28 (×11): 2 [IU] via SUBCUTANEOUS
  Administered 2021-05-28 – 2021-05-29 (×5): 1 [IU] via SUBCUTANEOUS
  Administered 2021-05-29: 2 [IU] via SUBCUTANEOUS
  Administered 2021-05-29: 1 [IU] via SUBCUTANEOUS
  Administered 2021-05-30: 2 [IU] via SUBCUTANEOUS
  Administered 2021-05-30 (×3): 1 [IU] via SUBCUTANEOUS
  Administered 2021-05-31: 3 [IU] via SUBCUTANEOUS
  Administered 2021-05-31: 1 [IU] via SUBCUTANEOUS
  Administered 2021-05-31 (×2): 2 [IU] via SUBCUTANEOUS
  Administered 2021-05-31 – 2021-06-02 (×9): 1 [IU] via SUBCUTANEOUS
  Administered 2021-06-02 (×2): 2 [IU] via SUBCUTANEOUS
  Administered 2021-06-03 (×2): 1 [IU] via SUBCUTANEOUS
  Administered 2021-06-03 (×3): 2 [IU] via SUBCUTANEOUS
  Administered 2021-06-04 – 2021-06-05 (×5): 1 [IU] via SUBCUTANEOUS
  Administered 2021-06-06: 2 [IU] via SUBCUTANEOUS
  Administered 2021-06-06 (×4): 1 [IU] via SUBCUTANEOUS
  Administered 2021-06-07 (×2): 2 [IU] via SUBCUTANEOUS
  Administered 2021-06-07 – 2021-06-15 (×21): 1 [IU] via SUBCUTANEOUS
  Administered 2021-06-15: 2 [IU] via SUBCUTANEOUS

## 2021-05-16 MED ORDER — PROSOURCE TF PO LIQD
45.0000 mL | Freq: Two times a day (BID) | ORAL | Status: DC
  Administered 2021-05-16 – 2021-05-28 (×25): 45 mL
  Filled 2021-05-16 (×24): qty 45

## 2021-05-16 MED ORDER — TRAVASOL 10 % IV SOLN
INTRAVENOUS | Status: AC
  Filled 2021-05-16: qty 1017.6

## 2021-05-16 NOTE — Progress Notes (Signed)
PHARMACY - TOTAL PARENTERAL NUTRITION CONSULT NOTE  ? ?Indication:  intolerance to tube feeds  ? ?Patient Measurements: ?Height: '5\' 3"'$  (160 cm) ?Weight: 90.8 kg (200 lb 2.8 oz) ?IBW/kg (Calculated) : 52.4 ?TPN AdjBW (KG): 62.6 ?Body mass index is 35.46 kg/m?. ?Usual Weight: 96.8 kg ? 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  Cortrak placed on 05/11/2021 and TF initated. Emesis started 05/13/2021. TPN order received 05/14/2021. Patient has had minimal PO intake >1 week. She remains intubated and sedated on a fentanyl drip. Propofol d/c'd 3/17. Noted encephalopathy likely related to hyperammonemia associated with valproic acid and zonisamide, with this regimen d/c'd and changed to [place] per Neurology. ? ?Glucose / Insulin: A1c 6.1%. CBGs <180. Utilized 5 units of SSI over the past 24 hours. No history of DM noted. No insulin ordered in TPN. ?Electrolytes: K 3.4>4.4 (s/p KCl 113mq total + KPhos 326ml IV x 1 yesterday), Phos 2.4>3.5 (s/p KPhos 3066m IV x 1 yesterday); all others WNL ?Renal: Scr WNL, BUN WNL ?Hepatic: LFTs previously WNL but increased today to AST 112/ALT 70, Tbili WNL, TG 579>250, albumin 2.1. Ammonia trending down to 66 ?Intake / Output: UOP 1 ml/kg/hr; 150 ml/24hrs stool output; LBM 3/20, net +6.6L this admit ?MIVF: none ?GI Imaging: ?3/16- CTA shows extensive bibasilar heterogenous airspace opacity and consolidation consistent w/ infection or aspiration ?  ?GI Surgeries / Procedures:  ?05/05/2021- exploratory laparotomy, right hemicolectomy; cholecystectomy ? ?Central access: Triple lumen (CVC) placed 05/12/2021 ?TPN start date: 05/14/2021 ? ?Nutritional Goals: ?Goal TPN rate is 80 mL/hr (provides 101 gram of protein and 2019 kcals per day) ? ?RD Assessment: ?Estimated Needs ?Total Energy Estimated Needs: 2000-2200 ?Total Protein Estimated Needs: 100-115g ?Total Fluid Estimated Needs:  >/=2L ? ?Current Nutrition:  ?TPN; NPO ?Resuming TF 3/20 ?Prosource TID (provdies 33g protein + 120kCal per day) ? ?Plan:  ?Increase TPN to goal 80 mL/hr at 1800. Will add back lipids with TG trending down off propofol. Plan to re-try TF today and monitor. Per discussion with Trauma MD (LoBobbye Mortonwill continue TPN at full goal for now until sure tolerating TF. ?Electrolytes in TPN: Na 20 mEq/L, K 60 mEq/L, Ca 5mE85m, remove Mg, and Phos 15 mmol/L. Cl:Ac 1:1 ?Add standard MVI and trace elements to TPN ?Adjust to Sensitive q4h SSI with addition of TF and adjust as needed  ?Monitor TPN labs daily until stable, then Mon/Thurs ?F/u toleration of re-trial of TF and ability to begin to wean TPN on 3/21 ? ? ?HaleArturo MortonarmD, BCPS ?Please check AMION for all [facility] POdenvilletact numbers ?Clinical Pharmacist ?05/16/2021 7:41 AM ?

## 2021-05-16 NOTE — Progress Notes (Signed)
Subjective: No seizures overnight ? ?ROS: Unable to obtain due to intubation ? ?Examination ? ?Vital signs in last 24 hours: ?Temp:  [99.4 ?F (37.4 ?C)-100.7 ?F (38.2 ?C)] 99.4 ?F (37.4 ?C) (03/20 1157) ?Pulse Rate:  [91-115] 113 (03/20 1300) ?Resp:  [13-31] 27 (03/20 1300) ?BP: (120-175)/(54-113) 142/57 (03/20 1300) ?SpO2:  [87 %-98 %] 91 % (03/20 1300) ?Arterial Line BP: (136-214)/(55-101) 179/71 (03/20 1000) ?FiO2 (%):  [40 %-50 %] 45 % (03/20 1108) ?Weight:  [90.8 kg] 90.8 kg (03/20 0500) ? ?General: lying in bed, NAD ?Neuro: awake, follows commands, PERLA, No gaze deviation, antigravity strength in all extremities ? ?Basic Metabolic Panel: ?Recent Labs  ?Lab 05/12/21 ?1636 05/13/21 ?0500 05/13/21 ?1802 05/14/21 ?0407 05/14/21 ?5465 05/15/21 ?6812 05/16/21 ?0419  ?NA  --  144 145 148* 147* 146* 143  ?K  --  2.5* 4.1 3.5 3.5 3.4* 4.4  ?CL  --  103 107  --  108 108 109  ?CO2  --  30 29  --  '29 31 28  '$ ?GLUCOSE  --  140* 149*  --  120* 143* 141*  ?BUN  --  20 18  --  '18 18 13  '$ ?CREATININE  --  1.21* 0.86  --  0.71 0.59 0.61  ?CALCIUM  --  8.1* 7.7*  --  7.8* 7.9* 8.1*  ?MG 2.4 2.4  --   --  2.6* 2.5* 2.2  ?PHOS 4.4 1.1* 3.2  --  2.7 2.4* 3.5  ? ? ?CBC: ?Recent Labs  ?Lab 05/11/21 ?0333 05/11/21 ?1322 05/12/21 ?7517 05/12/21 ?0756 05/12/21 ?1217 05/13/21 ?0500 05/14/21 ?0407 05/14/21 ?0017 05/15/21 ?0541  ?WBC 15.7*  --  18.9*  --   --  15.6*  --  17.8* 19.3*  ?HGB 11.8*   < > 10.7*   < > 9.9* 10.0* 10.2* 9.8* 9.4*  ?HCT 36.0   < > 33.4*   < > 29.0* 30.4* 30.0* 31.0* 30.1*  ?MCV 88.9  --  90.0  --   --  90.2  --  91.4 91.5  ?PLT 286  --  289  --   --  277  --  274 250  ? < > = values in this interval not displayed.  ? ? ? ?Coagulation Studies: ?No results for input(s): LABPROT, INR in the last 72 hours. ? ?Imaging ?No new brain imaging overnight ? ?ASSESSMENT AND PLAN: 86 female with history of epilepsy, Chiari malformation status post VP shunt placement admitted for  neuroendocrine tumor of terminal ileum, Meckels  diverticulum s/p exploratory laparotomy, cholecystectomy, R hemicolectomy on 05/05/2021.  Postop course has been complicated by encephalopathy. ?  ?Epilepsy ?Chiari malformation status post VP shunt ?Acute toxic-metabolic encephalopathy, improving ?Hyperammonemia, improving ?Hypertriglyceridemia ?-Likely due to hyperammonemia in the setting of Depakote use.  Patient was noncompliant with Depakote at home ?  ?Recommendations: ?-Continue Keppra 750 mg twice daily to patient is extubated and able to tolerate p.o. ?-Once able to tolerate p.o., can switch to zonisamide 100 mg twice daily ?-Recommend follow-up with Dr. Joesph July, patient's primary epileptologist in 6 to 8 weeks after discharge ?-Continue seizure precautions including do not drive ?-As needed IV Ativan 2 mg for clinical seizure-like activity ?-Management of rest of comorbidities per primary team ? ?Seizure precautions: ?Per [facility] statutes, patients with seizures are not allowed to drive until they have been seizure-free for six months and cleared by a physician  ?  ?Use caution when using heavy equipment or power tools. Avoid working on ladders or  at heights. Take showers instead of baths. Ensure the water temperature is not too high on the home water heater. Do not go swimming alone. Do not lock yourself in a room alone (i.e. bathroom). When caring for infants or small children, sit down when holding, feeding, or changing them to minimize risk of injury to the child in the event you have a seizure. Maintain good sleep hygiene. Avoid alcohol.  ?  ?If patient has another seizure, call 911 and bring them back to the ED if: ?A.  The seizure lasts longer than 5 minutes.      ?B.  The patient doesn't wake shortly after the seizure or has new problems such as difficulty seeing, speaking or moving following the seizure ?C.  The patient was injured during the seizure ?D.  The patient has a temperature over 102 F (39C) ?E.  The patient vomited during the  seizure and now is having trouble breathing ?   ?During the Seizure ?  ?- First, ensure adequate ventilation and place patients on the floor on their left side  ?Loosen clothing around the neck and ensure the airway is patent. If the patient is clenching the teeth, do not force the mouth open with any object as this can cause severe damage ?- Remove all items from the surrounding that can be hazardous. The patient may be oblivious to what's happening and may not even know what he or she is doing. ?If the patient is confused and wandering, either gently guide him/her away and block access to outside areas ?- Reassure the individual and be comforting ?- Call 911. In most cases, the seizure ends before EMS arrives. However, there are cases when seizures may last over 3 to 5 minutes. Or the individual may have developed breathing difficulties or severe injuries. If a pregnant patient or a person with diabetes develops a seizure, it is prudent to call an ambulance. ?- Finally, if the patient does not regain full consciousness, then call EMS. Most patients will remain confused for about 45 to 90 minutes after a seizure, so you must use judgment in calling for help. ?  ? After the Seizure (Postictal Stage) ?  ?After a seizure, most patients experience confusion, fatigue, muscle pain and/or a headache. Thus, one should permit the individual to sleep. For the next few days, reassurance is essential. Being calm and helping reorient the person is also of importance. ?  ?Most seizures are painless and end spontaneously. Seizures are not harmful to others but can lead to complications such as stress on the lungs, brain and the heart. Individuals with prior lung problems may develop labored breathing and respiratory distress.  ? ?I have spent a total of  36  minutes with the patient reviewing hospital notes,  test results, labs and examining the patient as well as establishing an assessment and plan that was discussed personally  with the patient.  > 50% of time was spent in direct patient care. ?  ?  ?Zeb Comfort ?Epilepsy ?[facility] ?For questions after 5pm please refer to AMION to reach the Neurologist on call ? ?

## 2021-05-16 NOTE — Progress Notes (Signed)
? ?Trauma/Critical Care Follow Up Note ? ?Subjective:  ?  ?Overnight Issues:  ? ?Objective:  ?Vital signs for last 24 hours: ?Temp:  [99.8 ?F (37.7 ?C)-100.7 ?F (38.2 ?C)] 100.2 ?F (37.9 ?C) (03/20 0300) ?Pulse Rate:  [90-115] 108 (03/20 0700) ?Resp:  [13-31] 20 (03/20 0700) ?BP: (111-175)/(45-113) 143/68 (03/20 0700) ?SpO2:  [87 %-99 %] 91 % (03/20 0700) ?Arterial Line BP: (136-214)/(55-101) 172/66 (03/20 0700) ?FiO2 (%):  [50 %] 50 % (03/20 0500) ?Weight:  [90.8 kg] 90.8 kg (03/20 0500) ? ?Hemodynamic parameters for last 24 hours: ?  ? ?Intake/Output from previous day: ?03/19 0701 - 03/20 0700 ?In: 2518.4 [I.V.:1460.9; NG/GT:300; IV Piggyback:727.5] ?Out: 2650 [Urine:2225; Stool:425]  ?Intake/Output this shift: ?No intake/output data recorded. ? ?Vent settings for last 24 hours: ?Vent Mode: PRVC ?FiO2 (%):  [50 %] 50 % ?Set Rate:  [18 bmp] 18 bmp ?Vt Set:  [500 mL] 500 mL ?PEEP:  [8 cmH20] 8 cmH20 ?Plateau Pressure:  [25 CBS49-67 cmH20] 25 cmH20 ? ?Physical Exam:  ?Gen: comfortable, no distress ?Neuro: f/c for me this AM ?HEENT: PERRL ?Neck: supple ?CV: RRR ?Pulm: unlabored breathing ?Abd: soft, NT ?GU: clear yellow urine ?Extr: wwp, no edema ? ? ?Results for orders placed or performed during the hospital encounter of 05/05/21 (from the past 24 hour(s))  ?Glucose, capillary     Status: Abnormal  ? Collection Time: 05/15/21 12:14 PM  ?Result Value Ref Range  ? Glucose-Capillary 144 (H) 70 - 99 mg/dL  ?Glucose, capillary     Status: Abnormal  ? Collection Time: 05/15/21  6:23 PM  ?Result Value Ref Range  ? Glucose-Capillary 144 (H) 70 - 99 mg/dL  ?Glucose, capillary     Status: Abnormal  ? Collection Time: 05/15/21 11:13 PM  ?Result Value Ref Range  ? Glucose-Capillary 142 (H) 70 - 99 mg/dL  ?Comprehensive metabolic panel     Status: Abnormal  ? Collection Time: 05/16/21  4:19 AM  ?Result Value Ref Range  ? Sodium 143 135 - 145 mmol/L  ? Potassium 4.4 3.5 - 5.1 mmol/L  ? Chloride 109 98 - 111 mmol/L  ? CO2 28 22 -  32 mmol/L  ? Glucose, Bld 141 (H) 70 - 99 mg/dL  ? BUN 13 6 - 20 mg/dL  ? Creatinine, Ser 0.61 0.44 - 1.00 mg/dL  ? Calcium 8.1 (L) 8.9 - 10.3 mg/dL  ? Total Protein 6.9 6.5 - 8.1 g/dL  ? Albumin 2.1 (L) 3.5 - 5.0 g/dL  ? AST 112 (H) 15 - 41 U/L  ? ALT 70 (H) 0 - 44 U/L  ? Alkaline Phosphatase 175 (H) 38 - 126 U/L  ? Total Bilirubin 0.5 0.3 - 1.2 mg/dL  ? GFR, Estimated >60 >60 mL/min  ? Anion gap 6 5 - 15  ?Magnesium     Status: None  ? Collection Time: 05/16/21  4:19 AM  ?Result Value Ref Range  ? Magnesium 2.2 1.7 - 2.4 mg/dL  ?Phosphorus     Status: None  ? Collection Time: 05/16/21  4:19 AM  ?Result Value Ref Range  ? Phosphorus 3.5 2.5 - 4.6 mg/dL  ?Triglycerides     Status: Abnormal  ? Collection Time: 05/16/21  4:19 AM  ?Result Value Ref Range  ? Triglycerides 250 (H) <150 mg/dL  ?Ammonia     Status: Abnormal  ? Collection Time: 05/16/21  4:19 AM  ?Result Value Ref Range  ? Ammonia 66 (H) 9 - 35 umol/L  ?Glucose, capillary  Status: Abnormal  ? Collection Time: 05/16/21  6:06 AM  ?Result Value Ref Range  ? Glucose-Capillary 156 (H) 70 - 99 mg/dL  ? ? ?Assessment & Plan: ?The plan of care was discussed with the bedside nurse for the day, who is in agreement with this plan and no additional concerns were raised.  ? ?Present on Admission: ? Neuroendocrine carcinoma ([place]) ? ? ? LOS: 11 days  ? ?Additional comments:I reviewed the patient's new clinical lab test results.   and I reviewed the patients new imaging test results.   ? ?Neuroendocrine tumor on colonoscopy - s/p exlap, R hemicolectomy, cholecystectomy 3/9. Well differentiated neuroendocrine tumor with 1/7 +nodes. D/w Dr. Marin Olp 3/15 will likely need adjuvant chemo. ?Respiratory failure 2/2 volume overload - CT chest 3/11 and 3/13 with no PE. Intubated 3/16, CXR with bibasilar airspace dz ?Volume overload - euvolemic now clinically, but persistent airspace disease ?Altered mental status - CT head 3/13, 3/16/ 3/17 normal (h/o VP shunt). Changed cefepime  to zosyn 3/13, total 7d course-end today, empiric coverage for PNA. Resp cx today. Ucx 3/13 negative. Bcx 3/15 NGTD. LP 3/16 NGTD. CT A/P 3/16 negative. 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 Neurology on board, changed to keppra, cont EEG without sz, encephalopathy much improved, d/c imodium to encourage NH4 loss.  ?vWD - humate P per Heme, end 3/12, hypercoagulable on TEG 3/13 ?Transaminitis - likely 2/2 TPN, d/c as soon as possible ?FEN - NPO, OGT, TF off 2/2 emesis, but will restart today, cont TPN ?DVT - SCDs, s/p DDVAP, LMWH, LE duplex neg for DVT 3/13 ?Foley - replaced 3/18 ?Dispo - ICU ? ?Critical Care Total Time: 45 minutes ? ?[name], MD ?Trauma & General Surgery ?Please use [email or web address] to contact on call provider ? ?05/16/2021 ? ?*Care during the described time interval was provided by me. I have reviewed this patient's available data, including medical history, events of note, physical examination and test results as part of my evaluation. ? ? ? ?

## 2021-05-16 NOTE — Progress Notes (Addendum)
Nutrition Follow-up ? ?DOCUMENTATION CODES:  ? ?Obesity unspecified ? ?INTERVENTION:  ? ?Resume tube feeding via OG tube: ?Vital 1.5 at 25 ml/h, increase by 10 ml every 4 hours until goal rate of 55 ml/hr (1320 ml per day) ?Prosource TF 45 ml BID ?  ?Provides 2060 kcal, 111 gm protein, 1008 ml free water daily. ? ?Continue TPN to meet nutrition needs until TF is tolerated at goal rate. ? ?NUTRITION DIAGNOSIS:  ? ?Increased nutrient needs related to acute illness as evidenced by estimated needs. ?Ongoing ? ?GOAL:  ? ?Patient will meet greater than or equal to 90% of their needs ?Progressing ? ?MONITOR:  ? ?TF tolerance ? ?REASON FOR ASSESSMENT:  ? ?Other (Comment) (Cortrak) ?  ? ?ASSESSMENT:  ? 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 PMH significant for type IA von Willebrand disease. ? ?3/9 s/p ex lap, R hemicolectomy, cholecystectomy for well differentiated neuroendocrine turmor with 1/7 + nodes.  ?3/15 s/p cortrak placement; tip gastric; started TF -- cortrak removed later that night ?3/16 intubated; OG tube placed  ?3/17 developed emesis; TF held ?3/18 TPN initiated ? ?Plans to resume TF today. ?OG tube tip is in the descending duodenum per abd x-ray this morning.  ?TPN is currently infusing at 60 ml/h. Increasing to goal rate of 80 ml/h today to provide 2019 kcal and 102 gm protein. ? ?Patient remains intubated on ventilator support. ?Temp (24hrs), Avg:100 ?F (37.8 ?C), Min:99.4 ?F (37.4 ?C), Max:100.7 ?F (38.2 ?C) ?MAP 72 ? ?Medications reviewed and include: Novolog, Fentanyl, Keppra ?Labs reviewed. Ammonia 66 ? ?Weight is now trending down. ?Admission weight 93.3 kg ?Current weight 90.8 kg ? ?Diet Order:   ?Diet Order   ? ?       ?  Diet NPO time specified  Diet effective now       ?  ? ?  ?  ? ?  ? ? ?EDUCATION NEEDS:  ? ?Education needs have been addressed ? ?Skin:  Skin Assessment: Skin Integrity Issues: ?Skin Integrity Issues:: Other (Comment), Incisions ?Incisions: abdomen  (closed) ?Other: MASD anus ? ?Last BM:  3/20 type 7, medium, brown & green ? ?Height:  ? ?Ht Readings from Last 1 Encounters:  ?05/12/21 '5\' 3"'$  (1.6 m)  ? ? ?Weight:  ? ?Wt Readings from Last 1 Encounters:  ?05/16/21 90.8 kg  ? ? ?Ideal Body Weight:  52.3 kg ? ?BMI:  Body mass index is 35.46 kg/m?. ? ?Estimated Nutritional Needs:  ? ?Kcal:  2000-2200 ? ?Protein:  100-115g ? ?Fluid:  >/=2L ? ? ?Lucas Mallow RD, LDN, CNSC ?Please refer to Amion for contact information.                                                       ? ?

## 2021-05-16 NOTE — Plan of Care (Signed)
?  Problem: Nutrition: ?Goal: Adequate nutrition will be maintained ?Outcome: Progressing ?  ?Problem: Safety: ?Goal: Ability to remain free from injury will improve ?Outcome: Progressing ?  ?Problem: Safety: ?Goal: Non-violent Restraint(s) ?Outcome: Completed/Met ?  ?

## 2021-05-16 NOTE — Progress Notes (Signed)
Patient ID: Cassidy Tran, female   DOB: 26-Oct-1975, 46 y.o.   MRN: [id number] ?Vital signs are stable on ventilator support but this appears to be decreasing ?Level of consciousness is remarkably improving today she held her eyes open responded appropriately she gets a bit agitated and will not clearly like to have the tube out ?Overall changes are encouraging hopefully she will be able to be extubated in the near future. ?Discussed with patient's parents who are present in the room ?

## 2021-05-17 ENCOUNTER — Inpatient Hospital Stay (HOSPITAL_COMMUNITY): Payer: No Typology Code available for payment source

## 2021-05-17 LAB — GLUCOSE, CAPILLARY
Glucose-Capillary: 173 mg/dL — ABNORMAL HIGH (ref 70–99)
Glucose-Capillary: 172 mg/dL — ABNORMAL HIGH (ref 70–99)
Glucose-Capillary: 190 mg/dL — ABNORMAL HIGH (ref 70–99)
Glucose-Capillary: 170 mg/dL — ABNORMAL HIGH (ref 70–99)
Glucose-Capillary: 147 mg/dL — ABNORMAL HIGH (ref 70–99)
Glucose-Capillary: 138 mg/dL — ABNORMAL HIGH (ref 70–99)

## 2021-05-17 LAB — HEPATIC FUNCTION PANEL
ALT: 91 U/L — ABNORMAL HIGH (ref 0–44)
AST: 131 U/L — ABNORMAL HIGH (ref 15–41)
Albumin: 2 g/dL — ABNORMAL LOW (ref 3.5–5.0)
Alkaline Phosphatase: 155 U/L — ABNORMAL HIGH (ref 38–126)
Bilirubin, Direct: 0.3 mg/dL — ABNORMAL HIGH (ref 0.0–0.2)
Indirect Bilirubin: 0.4 mg/dL (ref 0.3–0.9)
Total Bilirubin: 0.7 mg/dL (ref 0.3–1.2)
Total Protein: 6.8 g/dL (ref 6.5–8.1)

## 2021-05-17 LAB — POCT I-STAT 7, (LYTES, BLD GAS, ICA,H+H)
Acid-Base Excess: 3 mmol/L — ABNORMAL HIGH (ref 0.0–2.0)
Bicarbonate: 28.6 mmol/L — ABNORMAL HIGH (ref 20.0–28.0)
Calcium, Ion: 1.19 mmol/L (ref 1.15–1.40)
HCT: 29 % — ABNORMAL LOW (ref 36.0–46.0)
Hemoglobin: 9.9 g/dL — ABNORMAL LOW (ref 12.0–15.0)
O2 Saturation: 93 %
Patient temperature: 98.5
Potassium: 4.2 mmol/L (ref 3.5–5.1)
Sodium: 144 mmol/L (ref 135–145)
TCO2: 30 mmol/L (ref 22–32)
pCO2 arterial: 45.2 mmHg (ref 32–48)
pH, Arterial: 7.41 (ref 7.35–7.45)
pO2, Arterial: 68 mmHg — ABNORMAL LOW (ref 83–108)

## 2021-05-17 LAB — BASIC METABOLIC PANEL
Anion gap: 9 (ref 5–15)
BUN: 14 mg/dL (ref 6–20)
CO2: 27 mmol/L (ref 22–32)
Calcium: 8 mg/dL — ABNORMAL LOW (ref 8.9–10.3)
Chloride: 105 mmol/L (ref 98–111)
Creatinine, Ser: 0.54 mg/dL (ref 0.44–1.00)
GFR, Estimated: >60 mL/min (ref >60)
Glucose, Bld: 166 mg/dL — ABNORMAL HIGH (ref 70–99)
Potassium: 4 mmol/L (ref 3.5–5.1)
Sodium: 141 mmol/L (ref 135–145)

## 2021-05-17 LAB — PHOSPHORUS: Phosphorus: 3.7 mg/dL (ref 2.5–4.6)

## 2021-05-17 LAB — CBC
HCT: 29.5 % — ABNORMAL LOW (ref 36.0–46.0)
Hemoglobin: 9.3 g/dL — ABNORMAL LOW (ref 12.0–15.0)
MCH: 29.5 pg (ref 26.0–34.0)
MCHC: 31.5 g/dL (ref 30.0–36.0)
MCV: 93.7 fL (ref 80.0–100.0)
Platelets: 316 10*3/uL (ref 150–400)
RBC: 3.15 MIL/uL — ABNORMAL LOW (ref 3.87–5.11)
RDW: 15.4 % (ref 11.5–15.5)
WBC: 32 10*3/uL — ABNORMAL HIGH (ref 4.0–10.5)
nRBC: 0.2 % (ref 0.0–0.2)

## 2021-05-17 LAB — MAGNESIUM: Magnesium: 2.2 mg/dL (ref 1.7–2.4)

## 2021-05-17 LAB — TRIGLYCERIDES: Triglycerides: 272 mg/dL — ABNORMAL HIGH (ref <150)

## 2021-05-17 MED ORDER — MIDAZOLAM HCL 2 MG/2ML IJ SOLN
INTRAMUSCULAR | Status: AC
  Administered 2021-05-17: 4 mg via INTRAVENOUS
  Filled 2021-05-17: qty 4

## 2021-05-17 MED ORDER — MIDAZOLAM HCL 2 MG/2ML IJ SOLN
INTRAMUSCULAR | Status: AC
  Filled 2021-05-17: qty 4

## 2021-05-17 MED ORDER — ROCURONIUM BROMIDE 50 MG/5ML IV SOLN
50.0000 mg | Freq: Once | INTRAVENOUS | Status: AC
  Filled 2021-05-17: qty 5

## 2021-05-17 MED ORDER — ROCURONIUM BROMIDE 10 MG/ML (PF) SYRINGE
PREFILLED_SYRINGE | INTRAVENOUS | Status: AC
Start: 2021-05-17 — End: 2021-05-17
  Administered 2021-05-17: 50 mg via INTRAVENOUS
  Filled 2021-05-17: qty 10

## 2021-05-17 MED ORDER — MIDAZOLAM HCL 2 MG/2ML IJ SOLN
4.0000 mg | Freq: Once | INTRAMUSCULAR | Status: AC
  Administered 2021-05-17: 4 mg via INTRAVENOUS

## 2021-05-17 MED ORDER — MIDAZOLAM HCL 2 MG/2ML IJ SOLN: 4.0000 mg | Freq: Once | INTRAMUSCULAR | Status: AC

## 2021-05-17 MED ORDER — SODIUM CHLORIDE 0.9 % IV SOLN
1.0000 g | Freq: Three times a day (TID) | INTRAVENOUS | Status: DC
  Administered 2021-05-17 – 2021-05-24 (×21): 1 g via INTRAVENOUS
  Filled 2021-05-17 (×22): qty 20

## 2021-05-17 NOTE — Progress Notes (Signed)
Patient ID: Cassidy Tran, female   DOB: 1975/12/10, 46 y.o.   MRN: [id number] ?Vital signs are stable ?Motor function is intact ?Weaning from the ventilator as tolerated ?Continue supportive care ?Discussed with husband this morning ?

## 2021-05-17 NOTE — Procedures (Signed)
? ?  Procedure Note ? ?Date: 05/17/2021 ? ?Procedure: bronchoscopy ? ?Pre-op diagnosis: infectious workup ?Post-op diagnosis: same ? ?Indication and clinical history: 34F with concern for PNA ? ?Surgeon: [name], MD ? ?Anesthesia: MAC ? ?Findings: clear airways with helathy appearing mucosa ?Specimen: BAL-bilateral lungs ? ?EBL: 0cc ? ?Description of procedure: The patient was positioned semi-recumbent. Time-out was performed verifying correct patient, procedure, and signature of informed consent. MAC induction was uneventful and the patient was confirmed to be on 100% FiO2. The bronchoscope was inserted into the endotracheal tube and into the airway. Bronchoscopy was performed bilaterally and evaluation of the left side of the airway revealed healthy mucosa and no secretions. Bronchoalveolar lavage was performed and the specimen sent for Gram stain and quantitative culture. Evaluation of the right side of the airway revealed healthy mucosa and no secretions. Bronchoalveolar lavage was performed and the specimen sent for Gram stain and quantitative culture. ?The bronchoscope was removed from the airway and the endotracheal tube. The patient tolerated the procedure well. There were no complications. Post procedure chest x-ray was ordered.  ? ? ?[name], MD ?General and Trauma Surgery ?[facility]   ? ? ? ? ?

## 2021-05-17 NOTE — Progress Notes (Signed)
? ?General Surgery Follow Up Note ? ?Subjective:  ?  ?Overnight Issues:  ? ?Objective:  ?Vital signs for last 24 hours: ?Temp:  [97.7 ?F (36.5 ?C)-100.9 ?F (38.3 ?C)] 100.7 ?F (38.2 ?C) (03/21 0400) ?Pulse Rate:  [96-116] 109 (03/21 0804) ?Resp:  [17-29] 29 (03/21 0804) ?BP: (122-164)/(39-110) 149/69 (03/21 0800) ?SpO2:  [87 %-100 %] 92 % (03/21 0804) ?Arterial Line BP: (179)/(71) 179/71 (03/20 1000) ?FiO2 (%):  [45 %] 45 % (03/21 0804) ?Weight:  [91 kg] 91 kg (03/21 0500) ? ?Hemodynamic parameters for last 24 hours: ?  ? ?Intake/Output from previous day: ?03/20 0701 - 03/21 0700 ?In: 2983.4 [I.V.:1932.2; NG/GT:785.2; IV Piggyback:216] ?Out: 2830 [Urine:2105; Stool:725]  ?Intake/Output this shift: ?No intake/output data recorded. ? ?Vent settings for last 24 hours: ?Vent Mode: PSV;CPAP ?FiO2 (%):  [45 %] 45 % ?Set Rate:  [18 bmp] 18 bmp ?Vt Set:  [500 mL] 500 mL ?PEEP:  [8 cmH20] 8 cmH20 ?Pressure Support:  [8 cmH20] 8 cmH20 ?Plateau Pressure:  [16 cmH20-19 cmH20] 19 cmH20 ? ?Physical Exam:  ?Gen: comfortable, no distress ?Neuro: non-focal exam ?HEENT: PERRL ?Neck: supple ?CV: RRR ?Pulm: unlabored breathing ?Abd: soft, NT ?GU: clear yellow urine ?Extr: wwp, no edema ? ? ?Results for orders placed or performed during the hospital encounter of 05/05/21 (from the past 24 hour(s))  ?Glucose, capillary     Status: Abnormal  ? Collection Time: 05/16/21 11:22 AM  ?Result Value Ref Range  ? Glucose-Capillary 148 (H) 70 - 99 mg/dL  ?Glucose, capillary     Status: Abnormal  ? Collection Time: 05/16/21  3:30 PM  ?Result Value Ref Range  ? Glucose-Capillary 154 (H) 70 - 99 mg/dL  ?Glucose, capillary     Status: Abnormal  ? Collection Time: 05/16/21  7:23 PM  ?Result Value Ref Range  ? Glucose-Capillary 183 (H) 70 - 99 mg/dL  ?Glucose, capillary     Status: Abnormal  ? Collection Time: 05/16/21 11:21 PM  ?Result Value Ref Range  ? Glucose-Capillary 145 (H) 70 - 99 mg/dL  ?Glucose, capillary     Status: Abnormal  ? Collection  Time: 05/17/21  3:23 AM  ?Result Value Ref Range  ? Glucose-Capillary 173 (H) 70 - 99 mg/dL  ?Basic metabolic panel     Status: Abnormal  ? Collection Time: 05/17/21  4:55 AM  ?Result Value Ref Range  ? Sodium 141 135 - 145 mmol/L  ? Potassium 4.0 3.5 - 5.1 mmol/L  ? Chloride 105 98 - 111 mmol/L  ? CO2 27 22 - 32 mmol/L  ? Glucose, Bld 166 (H) 70 - 99 mg/dL  ? BUN 14 6 - 20 mg/dL  ? Creatinine, Ser 0.54 0.44 - 1.00 mg/dL  ? Calcium 8.0 (L) 8.9 - 10.3 mg/dL  ? GFR, Estimated >60 >60 mL/min  ? Anion gap 9 5 - 15  ?Magnesium     Status: None  ? Collection Time: 05/17/21  4:55 AM  ?Result Value Ref Range  ? Magnesium 2.2 1.7 - 2.4 mg/dL  ?Phosphorus     Status: None  ? Collection Time: 05/17/21  4:55 AM  ?Result Value Ref Range  ? Phosphorus 3.7 2.5 - 4.6 mg/dL  ?Glucose, capillary     Status: Abnormal  ? Collection Time: 05/17/21  7:30 AM  ?Result Value Ref Range  ? Glucose-Capillary 172 (H) 70 - 99 mg/dL  ? ? ?Assessment & Plan: ?The plan of care was discussed with the bedside nurse for the day, who is in agreement  with this plan and no additional concerns were raised.  ? ?Present on Admission: ? Neuroendocrine carcinoma ([place]) ? ? ? LOS: 12 days  ? ?Additional comments:I reviewed the patient's new clinical lab test results.   and I reviewed the patients new imaging test results.   ? ?Neuroendocrine tumor on colonoscopy - s/p exlap, R hemicolectomy, cholecystectomy 3/9. Well differentiated neuroendocrine tumor with 1/7 +nodes. D/w Dr. Marin Olp 3/15 will likely need adjuvant chemo. ?Respiratory failure 2/2 volume overload - CT chest 3/11 and 3/13 with no PE. Intubated 3/16, suspect PNA based on CXR but negative cx, plan for bronch with BAL today and empiric abx ?Volume overload - euvolemic now clinically, but persistent airspace disease suspicious for PNA ?Altered mental status - CT head 3/13, 3/16/ 3/17 normal (h/o VP shunt). Changed cefepime to zosyn 3/13, total 7d course-end today, empiric coverage for PNA. Resp cx  today. Ucx 3/13 negative. Bcx 3/15 NGTD. LP 3/16 NGTD. CT A/P 3/16 negative. 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 Neurology on board, changed to keppra, cont EEG without sz, encephalopathy much improved, d/c imodium to encourage NH4 loss.  ?vWD - humate P per Heme, end 3/12, hypercoagulable on TEG 3/13 ?Transaminitis - likely 2/2 TPN, d/c as soon as possible ?FEN - NPO, OGT, tol TF at goal, wean TPN ?DVT - SCDs, s/p DDVAP, LMWH, LE duplex neg for DVT 3/13 ?Foley - replaced 3/18 ?Dispo - ICU ? ?Critical Care Total Time: 45 minutes ? ?[name], MD ?Trauma & General Surgery ?Please use [email or web address] to contact on call provider ? ?05/17/2021 ? ?*Care during the described time interval was provided by me. I have reviewed this patient's available data, including medical history, events of note, physical examination and test results as part of my evaluation. ? ?

## 2021-05-17 NOTE — Progress Notes (Signed)
Pharmacy Antibiotic Note ? ?Cassidy Tran is a 46 y.o. female admitted on 05/05/2021 with pneumonia.  Pharmacy has been consulted for meropenem dosing. Patient previously on Zosyn for 7 days through 3/18; however now intermittent fevers and WBC trended up to 32. SCr stable WNL. ? ?Plan: ?Meropenem 1g IV q8h ?Monitor clinical progress, c/s, renal function ?F/u de-escalation plan/LOT ? ? ?Height: '5\' 3"'$  (160 cm) ?Weight: 91 kg (200 lb 9.9 oz) ?IBW/kg (Calculated) : 52.4 ? ?Temp (24hrs), Avg:99.4 ?F (37.4 ?C), Min:97.7 ?F (36.5 ?C), Max:100.9 ?F (38.3 ?C) ? ?Recent Labs  ?Lab 05/12/21 ?6237 05/13/21 ?0500 05/13/21 ?1802 05/14/21 ?6283 05/15/21 ?1517 05/16/21 ?0419 05/17/21 ?0455 05/17/21 ?0839  ?WBC 18.9* 15.6*  --  17.8* 19.3*  --   --  32.0*  ?CREATININE 1.01* 1.21* 0.86 0.71 0.59 0.61 0.54  --   ?  ?Estimated Creatinine Clearance: 93 mL/min (by C-G formula based on SCr of 0.54 mg/dL).   ? ?Allergies  ?Allergen Reactions  ? Aspirin Other (See Comments)  ?  Childhood reaction  ? Codeine Nausea And Vomiting  ?  Tolerates Hydrocodone  ? Erythromycin Other (See Comments)  ?  Childhood reaction, tolerate Zpak ?Childhood reaction does not know.  ? Lactose Intolerance (Gi) Diarrhea  ? Nsaids Nausea And Vomiting  ? Penicillins Other (See Comments)  ?  Has patient had a PCN reaction causing immediate rash, facial/tongue/throat swelling, SOB or lightheadedness with hypotension: doesn't remember childhood Reaction  ?Has patient had a PCN reaction causing severe rash involving mucus membranes or skin necrosis: NO ?Has patient had a PCN reaction that required hospitalization NO ?Has patient had a PCN reaction occurring within the last 10 years: NO ?If all of the above answers are "NO", then may proceed with Cephalosporin use. ?  ? ? ?Arturo Morton, PharmD, BCPS ?Please check AMION for all [facility] contact numbers ?Clinical Pharmacist ?05/17/2021 12:37 PM ?

## 2021-05-17 NOTE — Progress Notes (Signed)
PHARMACY - TOTAL PARENTERAL NUTRITION CONSULT NOTE  ? ?Indication:  intolerance to tube feeds  ? ?Patient Measurements: ?Height: '5\' 3"'$  (160 cm) ?Weight: 91 kg (200 lb 9.9 oz) ?IBW/kg (Calculated) : 52.4 ?TPN AdjBW (KG): 62.6 ?Body mass index is 35.54 kg/m?. ?Usual Weight: 96.8 kg ? 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  Cortrak placed on 05/11/2021 and TF initated. Emesis started 05/13/2021. TPN order received 05/14/2021. Patient has had minimal PO intake >1 week. She remains intubated and sedated on a fentanyl drip. Propofol d/c'd 3/17. Noted encephalopathy likely related to hyperammonemia associated with valproic acid and zonisamide, with this regimen d/c'd and changed to [place] per Neurology. ? ?Glucose / Insulin: A1c 6.1%. CBGs 140-183, mostly controlled <180. Utilized 8 units of SSI over the past 24 hours. No history of DM noted. No insulin ordered in TPN. ?Electrolytes: all stable WNL ?Renal: Scr WNL, BUN WNL ?Hepatic: LFTs previously WNL but increased today to AST 112/ALT 70, Tbili WNL, TG 579>250, albumin 2.1. Ammonia trending down to 66 ?Intake / Output: UOP 1 ml/kg/hr; 500 ml/24hrs stool output; LBM 3/20, net +6.6L this admit ?MIVF: none ?GI Imaging: ?3/16- CTA shows extensive bibasilar heterogenous airspace opacity and consolidation consistent w/ infection or aspiration ?  ?GI Surgeries / Procedures:  ?05/05/2021- exploratory laparotomy, right hemicolectomy; cholecystectomy ? ?Central access: Triple lumen (CVC) placed 05/12/2021 ?TPN start date: 05/14/2021 ? ?Nutritional Goals: ?Goal TPN rate is 80 mL/hr (provides 101 gram of protein and 2019 kcals per day) ? ?RD Assessment: ?Estimated Needs ?Total Energy Estimated Needs: 2000-2200 ?Total Protein Estimated Needs: 100-115g ?Total Fluid Estimated Needs: >/=2L ? ?Current Nutrition:  ?TPN; NPO ?Resumed Vital 1.5 on 3/20 - tolerating at goal  rate ?Prosource TID ? ?Plan:  ?Per discussion with Dr. Bobbye Morton, will wean TPN off today with patient now tolerating TF at goal rate with no issues. ?Communicated plan with RN - at 1600, wean TPN to 40 ml/hr x 2 hrs, then stop TPN ?Add standard MVI and trace elements to TPN ?Continue Sensitive q4h SSI with patient on goal TF for now and adjust as needed  ?D/c TPN labs and nursing orders ? ? ?Arturo Morton, PharmD, BCPS ?Please check AMION for all [facility] contact numbers ?Clinical Pharmacist ?05/17/2021 7:26 AM ?

## 2021-05-18 ENCOUNTER — Inpatient Hospital Stay (HOSPITAL_COMMUNITY): Payer: No Typology Code available for payment source

## 2021-05-18 LAB — BASIC METABOLIC PANEL
Anion gap: 8 (ref 5–15)
BUN: 13 mg/dL (ref 6–20)
CO2: 30 mmol/L (ref 22–32)
Calcium: 8.4 mg/dL — ABNORMAL LOW (ref 8.9–10.3)
Chloride: 106 mmol/L (ref 98–111)
Creatinine, Ser: 0.59 mg/dL (ref 0.44–1.00)
GFR, Estimated: >60 mL/min (ref >60)
Glucose, Bld: 155 mg/dL — ABNORMAL HIGH (ref 70–99)
Potassium: 3.9 mmol/L (ref 3.5–5.1)
Sodium: 144 mmol/L (ref 135–145)

## 2021-05-18 LAB — CBC
HCT: 28.2 % — ABNORMAL LOW (ref 36.0–46.0)
Hemoglobin: 8.7 g/dL — ABNORMAL LOW (ref 12.0–15.0)
MCH: 28.9 pg (ref 26.0–34.0)
MCHC: 30.9 g/dL (ref 30.0–36.0)
MCV: 93.7 fL (ref 80.0–100.0)
Platelets: 382 10*3/uL (ref 150–400)
RBC: 3.01 MIL/uL — ABNORMAL LOW (ref 3.87–5.11)
RDW: 15.4 % (ref 11.5–15.5)
WBC: 31 10*3/uL — ABNORMAL HIGH (ref 4.0–10.5)
nRBC: 0.1 % (ref 0.0–0.2)

## 2021-05-18 LAB — GLUCOSE, CAPILLARY
Glucose-Capillary: 155 mg/dL — ABNORMAL HIGH (ref 70–99)
Glucose-Capillary: 144 mg/dL — ABNORMAL HIGH (ref 70–99)
Glucose-Capillary: 151 mg/dL — ABNORMAL HIGH (ref 70–99)
Glucose-Capillary: 139 mg/dL — ABNORMAL HIGH (ref 70–99)
Glucose-Capillary: 145 mg/dL — ABNORMAL HIGH (ref 70–99)
Glucose-Capillary: 115 mg/dL — ABNORMAL HIGH (ref 70–99)

## 2021-05-18 NOTE — Progress Notes (Signed)
? ?Trauma/Critical Care Follow Up Note ? ?Subjective:  ?  ?Overnight Issues:  ? ?Objective:  ?Vital signs for last 24 hours: ?Temp:  [97 ?F (36.1 ?C)-99.5 ?F (37.5 ?C)] 99.5 ?F (37.5 ?C) (03/22 2000) ?Pulse Rate:  [91-110] 94 (03/22 2000) ?Resp:  [14-32] 14 (03/22 2000) ?BP: (119-150)/(54-81) 142/67 (03/22 2000) ?SpO2:  [91 %-100 %] 96 % (03/22 2000) ?FiO2 (%):  [50 %-70 %] 60 % (03/22 1530) ?Weight:  [90.3 kg] 90.3 kg (03/22 0500) ? ?Hemodynamic parameters for last 24 hours: ?  ? ?Intake/Output from previous day: ?03/21 0701 - 03/22 0700 ?In: 2708 [I.V.:1118; NG/GT:1100; IV Piggyback:460] ?Out: 1103 [Urine:2865; Stool:900]  ?Intake/Output this shift: ?Total I/O ?In: 20.6 [I.V.:20.6] ?Out: -  ? ?Vent settings for last 24 hours: ?Vent Mode: PRVC ?FiO2 (%):  [50 %-70 %] 60 % ?Set Rate:  [18 bmp] 18 bmp ?Vt Set:  [420 mL] 420 mL ?PEEP:  [8 cmH20] 8 cmH20 ?Pressure Support:  [5 cmH20] 5 cmH20 ?Plateau Pressure:  [19 cmH20] 19 cmH20 ? ?Physical Exam:  ?Gen: comfortable, no distress ?Neuro: non-focal exam, interactive, f/c ?HEENT: PERRL ?Neck: supple ?CV: RRR ?Pulm: unlabored breathing ?Abd: soft, NT ?GU: clear yellow urine ?Extr: wwp, no edema ? ? ?Results for orders placed or performed during the hospital encounter of 05/05/21 (from the past 24 hour(s))  ?Glucose, capillary     Status: Abnormal  ? Collection Time: 05/17/21 11:13 PM  ?Result Value Ref Range  ? Glucose-Capillary 138 (H) 70 - 99 mg/dL  ?Glucose, capillary     Status: Abnormal  ? Collection Time: 05/18/21  3:39 AM  ?Result Value Ref Range  ? Glucose-Capillary 155 (H) 70 - 99 mg/dL  ?CBC     Status: Abnormal  ? Collection Time: 05/18/21  5:47 AM  ?Result Value Ref Range  ? WBC 31.0 (H) 4.0 - 10.5 K/uL  ? RBC 3.01 (L) 3.87 - 5.11 MIL/uL  ? Hemoglobin 8.7 (L) 12.0 - 15.0 g/dL  ? HCT 28.2 (L) 36.0 - 46.0 %  ? MCV 93.7 80.0 - 100.0 fL  ? MCH 28.9 26.0 - 34.0 pg  ? MCHC 30.9 30.0 - 36.0 g/dL  ? RDW 15.4 11.5 - 15.5 %  ? Platelets 382 150 - 400 K/uL  ? nRBC 0.1  0.0 - 0.2 %  ?Basic metabolic panel     Status: Abnormal  ? Collection Time: 05/18/21  5:47 AM  ?Result Value Ref Range  ? Sodium 144 135 - 145 mmol/L  ? Potassium 3.9 3.5 - 5.1 mmol/L  ? Chloride 106 98 - 111 mmol/L  ? CO2 30 22 - 32 mmol/L  ? Glucose, Bld 155 (H) 70 - 99 mg/dL  ? BUN 13 6 - 20 mg/dL  ? Creatinine, Ser 0.59 0.44 - 1.00 mg/dL  ? Calcium 8.4 (L) 8.9 - 10.3 mg/dL  ? GFR, Estimated >60 >60 mL/min  ? Anion gap 8 5 - 15  ?Glucose, capillary     Status: Abnormal  ? Collection Time: 05/18/21  7:30 AM  ?Result Value Ref Range  ? Glucose-Capillary 144 (H) 70 - 99 mg/dL  ?Glucose, capillary     Status: Abnormal  ? Collection Time: 05/18/21 11:30 AM  ?Result Value Ref Range  ? Glucose-Capillary 151 (H) 70 - 99 mg/dL  ?Glucose, capillary     Status: Abnormal  ? Collection Time: 05/18/21  3:20 PM  ?Result Value Ref Range  ? Glucose-Capillary 139 (H) 70 - 99 mg/dL  ?Glucose, capillary  Status: Abnormal  ? Collection Time: 05/18/21  7:24 PM  ?Result Value Ref Range  ? Glucose-Capillary 145 (H) 70 - 99 mg/dL  ? ? ?Assessment & Plan: ?The plan of care was discussed with the bedside nurse for the day, who is in agreement with this plan and no additional concerns were raised.  ? ?Present on Admission: ? Neuroendocrine carcinoma ([place]) ? ? ? LOS: 13 days  ? ?Additional comments:I reviewed the patient's new clinical lab test results.   and I reviewed the patients new imaging test results.   ? ?Neuroendocrine tumor on colonoscopy - s/p exlap, R hemicolectomy, cholecystectomy 3/9. Well differentiated neuroendocrine tumor with 1/7 +nodes. D/w Dr. Marin Olp 3/15 will likely need adjuvant chemo. ?Respiratory failure 2/2 volume overload - CT chest 3/11 and 3/13 with no PE. Intubated 3/16, suspect PNA based on CXR but negative cx, bronch with BAL 3/21, cx NGTD, empiric merrem started 3/21 ?Volume overload - euvolemic now clinically, but persistent airspace disease suspicious for PNA ?Altered mental status - CT head 3/13, 3/16/  3/17 normal (h/o VP shunt). Changed cefepime to zosyn 3/13, total 7d course-end today, empiric coverage for PNA. Resp cx today. Ucx 3/13 negative. Bcx 3/15 NGTD. LP 3/16 NGTD. CT A/P 3/16 negative. 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 Neurology on board, changed to keppra, cont EEG without sz, encephalopathy much improved, d/c imodium to encourage NH4 loss.  ?vWD - humate P per Heme, end 3/12, hypercoagulable on TEG 3/13 ?Transaminitis - likely 2/2 TPN, d/c as soon as possible ?FEN - NPO, OGT, tol TF at goal, wean TPN ?DVT - SCDs, s/p DDVAP, LMWH, LE duplex neg for DVT 3/13 ?Foley - replaced 3/18 ?Dispo - ICU ? ?Critical Care Total Time: 35 minutes ? ?[name], MD ?Trauma & General Surgery ?Please use [email or web address] to contact on call provider ? ?05/18/2021 ? ?*Care during the described time interval was provided by me. I have reviewed this patient's available data, including medical history, events of note, physical examination and test results as part of my evaluation. ? ? ? ?

## 2021-05-19 LAB — CBC
HCT: 26.8 % — ABNORMAL LOW (ref 36.0–46.0)
Hemoglobin: 8.3 g/dL — ABNORMAL LOW (ref 12.0–15.0)
MCH: 29.3 pg (ref 26.0–34.0)
MCHC: 31 g/dL (ref 30.0–36.0)
MCV: 94.7 fL (ref 80.0–100.0)
Platelets: 584 10*3/uL — ABNORMAL HIGH (ref 150–400)
RBC: 2.83 MIL/uL — ABNORMAL LOW (ref 3.87–5.11)
RDW: 15.5 % (ref 11.5–15.5)
WBC: 28.5 10*3/uL — ABNORMAL HIGH (ref 4.0–10.5)
nRBC: 0 % (ref 0.0–0.2)

## 2021-05-19 LAB — BASIC METABOLIC PANEL
Anion gap: 5 (ref 5–15)
BUN: 11 mg/dL (ref 6–20)
CO2: 30 mmol/L (ref 22–32)
Calcium: 7.7 mg/dL — ABNORMAL LOW (ref 8.9–10.3)
Chloride: 112 mmol/L — ABNORMAL HIGH (ref 98–111)
Creatinine, Ser: 0.52 mg/dL (ref 0.44–1.00)
GFR, Estimated: >60 mL/min (ref >60)
Glucose, Bld: 151 mg/dL — ABNORMAL HIGH (ref 70–99)
Potassium: 3.7 mmol/L (ref 3.5–5.1)
Sodium: 147 mmol/L — ABNORMAL HIGH (ref 135–145)

## 2021-05-19 LAB — CULTURE, BAL-QUANTITATIVE W GRAM STAIN
Culture: 10000 — AB
Culture: 50000 — AB

## 2021-05-19 LAB — GLUCOSE, CAPILLARY
Glucose-Capillary: 138 mg/dL — ABNORMAL HIGH (ref 70–99)
Glucose-Capillary: 145 mg/dL — ABNORMAL HIGH (ref 70–99)
Glucose-Capillary: 145 mg/dL — ABNORMAL HIGH (ref 70–99)
Glucose-Capillary: 141 mg/dL — ABNORMAL HIGH (ref 70–99)
Glucose-Capillary: 132 mg/dL — ABNORMAL HIGH (ref 70–99)
Glucose-Capillary: 109 mg/dL — ABNORMAL HIGH (ref 70–99)

## 2021-05-19 MED ORDER — POTASSIUM CHLORIDE 20 MEQ PO PACK
40.0000 meq | PACK | Freq: Once | ORAL | Status: AC
Start: 2021-05-19 — End: 2021-05-19
  Administered 2021-05-19: 40 meq
  Filled 2021-05-19: qty 2

## 2021-05-19 MED ORDER — FREE WATER
200.0000 mL | Freq: Four times a day (QID) | Status: DC
  Administered 2021-05-19 – 2021-05-20 (×4): 200 mL

## 2021-05-19 NOTE — Progress Notes (Signed)
Nutrition Follow-up ? ?DOCUMENTATION CODES:  ? ?Obesity unspecified ? ?INTERVENTION:  ? ?Tube feeding via OG tube (post pyloric): ?Vital 1.5 at 55 ml/h (1320 ml per day) ?Prosource TF 45 ml BID ? ?Provides 2060 kcal, 111 gm protein, 1008 ml free water daily ? ?200 ml free water every 6 hours ?Total free water: 1808 ml  ? ?NUTRITION DIAGNOSIS:  ? ?Increased nutrient needs related to acute illness as evidenced by estimated needs. ?Ongoing.  ? ?GOAL:  ? ?Patient will meet greater than or equal to 90% of their needs ?Met with TF at goal.  ? ?MONITOR:  ? ?TF tolerance ? ?REASON FOR ASSESSMENT:  ? ?Other (Comment) (Cortrak) ?  ? ?ASSESSMENT:  ? 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 PMH significant for type IA von Willebrand disease. ? ?Pt discussed during ICU rounds and with RN. Per trauma notes pt will likely need adjuvant chemo. Imodium d/c'ed to allow NH4 loss.  ? ? ?3/9 s/p ex lap, R hemicolectomy, cholecystectomy for well differentiated neuroendocrine turmor with 1/7 + nodes.  ?3/15 s/p cortrak placement; tip gastric; started TF -- cortrak removed later that night ?3/16 intubated; OG tube placed  ?3/17 developed emesis; TF held ?3/18 TPN initiated ?3/20 TF resumed and advanced to goal ? ?Medications reviewed and include: SSI, protonix ? ?Labs reviewed: Na 147 ?TG: 272 (3/21) ?NH4: 66 (3/20) ?CBG's: 115-151  ? ?OG tube: tip in descending duodenum  ? ?Diet Order:   ?Diet Order   ? ?       ?  Diet NPO time specified  Diet effective now       ?  ? ?  ?  ? ?  ? ? ?EDUCATION NEEDS:  ? ?Education needs have been addressed ? ?Skin:  Skin Assessment: Skin Integrity Issues: ?Skin Integrity Issues:: Other (Comment), Incisions ?Incisions: abdomen (closed) ?Other: MASD anus ? ?Last BM:  1100 ml via rectal tube ? ?Height:  ? ?Ht Readings from Last 1 Encounters:  ?05/12/21 5' 3" (1.6 m)  ? ? ?Weight:  ? ?Wt Readings from Last 1 Encounters:  ?05/18/21 90.3 kg  ? ? ?Ideal Body Weight:  52.3 kg ? ?BMI:   Body mass index is 35.26 kg/m?. ? ?Estimated Nutritional Needs:  ? ?Kcal:  2000-2200 ? ?Protein:  100-115g ? ?Fluid:  >/=2L ? ? P., RD, LDN, CNSC ?See AMiON for contact information  ? ?

## 2021-05-19 NOTE — Progress Notes (Signed)
? ?Trauma/Critical Care Follow Up Note ? ?Subjective:  ?  ?Overnight Issues:  ? ?Objective:  ?Vital signs for last 24 hours: ?Temp:  [97 ?F (36.1 ?C)-100.7 ?F (38.2 ?C)] 100.4 ?F (38 ?C) (03/23 0400) ?Pulse Rate:  [94-110] 94 (03/23 0600) ?Resp:  [14-27] 16 (03/23 0600) ?BP: (118-160)/(52-81) 126/53 (03/23 0600) ?SpO2:  [91 %-99 %] 99 % (03/23 0600) ?FiO2 (%):  [60 %] 60 % (03/23 0325) ? ?Hemodynamic parameters for last 24 hours: ?  ? ?Intake/Output from previous day: ?03/22 0701 - 03/23 0700 ?In: 2173.1 [I.V.:287.5; NG/GT:1320; IV Piggyback:565.6] ?Out: 2670 [JOACZ:6606; TKZSW:1093]  ?Intake/Output this shift: ?No intake/output data recorded. ? ?Vent settings for last 24 hours: ?Vent Mode: PRVC ?FiO2 (%):  [60 %] 60 % ?Set Rate:  [18 bmp] 18 bmp ?Vt Set:  [420 mL] 420 mL ?PEEP:  [8 cmH20] 8 cmH20 ?Plateau Pressure:  [11 cmH20-15 cmH20] 15 cmH20 ? ?Physical Exam:  ?Gen: comfortable, no distress ?Neuro: non-focal exam ?HEENT: PERRL ?Neck: supple ?CV: RRR ?Pulm: unlabored breathing ?Abd: soft, NT ?GU: clear yellow urine ?Extr: wwp, no edema ? ? ?Results for orders placed or performed during the hospital encounter of 05/05/21 (from the past 24 hour(s))  ?Glucose, capillary     Status: Abnormal  ? Collection Time: 05/18/21 11:30 AM  ?Result Value Ref Range  ? Glucose-Capillary 151 (H) 70 - 99 mg/dL  ?Glucose, capillary     Status: Abnormal  ? Collection Time: 05/18/21  3:20 PM  ?Result Value Ref Range  ? Glucose-Capillary 139 (H) 70 - 99 mg/dL  ?Glucose, capillary     Status: Abnormal  ? Collection Time: 05/18/21  7:24 PM  ?Result Value Ref Range  ? Glucose-Capillary 145 (H) 70 - 99 mg/dL  ?Glucose, capillary     Status: Abnormal  ? Collection Time: 05/18/21 11:20 PM  ?Result Value Ref Range  ? Glucose-Capillary 115 (H) 70 - 99 mg/dL  ?Glucose, capillary     Status: Abnormal  ? Collection Time: 05/19/21  3:28 AM  ?Result Value Ref Range  ? Glucose-Capillary 138 (H) 70 - 99 mg/dL  ?CBC     Status: Abnormal  ? Collection  Time: 05/19/21  5:37 AM  ?Result Value Ref Range  ? WBC 28.5 (H) 4.0 - 10.5 K/uL  ? RBC 2.83 (L) 3.87 - 5.11 MIL/uL  ? Hemoglobin 8.3 (L) 12.0 - 15.0 g/dL  ? HCT 26.8 (L) 36.0 - 46.0 %  ? MCV 94.7 80.0 - 100.0 fL  ? MCH 29.3 26.0 - 34.0 pg  ? MCHC 31.0 30.0 - 36.0 g/dL  ? RDW 15.5 11.5 - 15.5 %  ? Platelets 584 (H) 150 - 400 K/uL  ? nRBC 0.0 0.0 - 0.2 %  ?Basic metabolic panel     Status: Abnormal  ? Collection Time: 05/19/21  5:37 AM  ?Result Value Ref Range  ? Sodium 147 (H) 135 - 145 mmol/L  ? Potassium 3.7 3.5 - 5.1 mmol/L  ? Chloride 112 (H) 98 - 111 mmol/L  ? CO2 30 22 - 32 mmol/L  ? Glucose, Bld 151 (H) 70 - 99 mg/dL  ? BUN 11 6 - 20 mg/dL  ? Creatinine, Ser 0.52 0.44 - 1.00 mg/dL  ? Calcium 7.7 (L) 8.9 - 10.3 mg/dL  ? GFR, Estimated >60 >60 mL/min  ? Anion gap 5 5 - 15  ?Glucose, capillary     Status: Abnormal  ? Collection Time: 05/19/21  7:24 AM  ?Result Value Ref Range  ? Glucose-Capillary 145 (  H) 70 - 99 mg/dL  ? ? ?Assessment & Plan: ?The plan of care was discussed with the bedside nurse for the day, Merrilee Seashore, who is in agreement with this plan and no additional concerns were raised.  ? ?Present on Admission: ? Neuroendocrine carcinoma ([place]) ? ? ? LOS: 14 days  ? ?Additional comments:I reviewed the patient's new clinical lab test results.   and I reviewed the patients new imaging test results.   ? ?Neuroendocrine tumor on colonoscopy - s/p exlap, R hemicolectomy, cholecystectomy 3/9. Well differentiated neuroendocrine tumor with 1/7 +nodes. D/w Dr. Marin Olp 3/15 will likely need adjuvant chemo. Staples out 3/24. ?Respiratory failure 2/2 volume overload - CT chest 3/11 and 3/13 with no PE. Intubated 3/16, suspect PNA based on CXR but negative cx, bronch with BAL 3/21, cx NGTD, empiric merrem started 3/21 ?Volume overload - euvolemic now clinically, but persistent airspace disease suspicious for PNA ?Altered mental status - CT head 3/13, 3/16/ 3/17 normal (h/o VP shunt). Changed cefepime to zosyn 3/13, total  7d course-end today, empiric coverage for PNA. Resp cx today. Ucx 3/13 negative. Bcx 3/15 NGTD. LP 3/16 NGTD. CT A/P 3/16 negative. 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 Neurology on board, changed to keppra, cont EEG without sz, encephalopathy much improved, d/c imodium to encourage NH4 loss.  ?vWD - humate P per Heme, end 3/12, hypercoagulable on TEG 3/13 ?Transaminitis - likely 2/2 TPN, d/c as soon as possible ?FEN - NPO, OGT, tol TF at goal, wean TPN ?DVT - SCDs, s/p DDVAP, LMWH, LE duplex neg for DVT 3/13 ?Foley - replaced 3/18 ?Dispo - ICU ? ?Critical Care Total Time: 40 minutes ? ?[name], MD ?Trauma & General Surgery ?Please use [email or web address] to contact on call provider ? ?05/19/2021 ? ?*Care during the described time interval was provided by me. I have reviewed this patient's available data, including medical history, events of note, physical examination and test results as part of my evaluation. ? ? ? ?

## 2021-05-20 ENCOUNTER — Inpatient Hospital Stay (HOSPITAL_COMMUNITY): Payer: No Typology Code available for payment source

## 2021-05-20 LAB — CBC
HCT: 27 % — ABNORMAL LOW (ref 36.0–46.0)
Hemoglobin: 8.6 g/dL — ABNORMAL LOW (ref 12.0–15.0)
MCH: 29.7 pg (ref 26.0–34.0)
MCHC: 31.9 g/dL (ref 30.0–36.0)
MCV: 93.1 fL (ref 80.0–100.0)
Platelets: 712 10*3/uL — ABNORMAL HIGH (ref 150–400)
RBC: 2.9 MIL/uL — ABNORMAL LOW (ref 3.87–5.11)
RDW: 15.5 % (ref 11.5–15.5)
WBC: 23 10*3/uL — ABNORMAL HIGH (ref 4.0–10.5)
nRBC: 0.1 % (ref 0.0–0.2)

## 2021-05-20 LAB — BASIC METABOLIC PANEL
Anion gap: 5 (ref 5–15)
BUN: 11 mg/dL (ref 6–20)
CO2: 33 mmol/L — ABNORMAL HIGH (ref 22–32)
Calcium: 8.2 mg/dL — ABNORMAL LOW (ref 8.9–10.3)
Chloride: 113 mmol/L — ABNORMAL HIGH (ref 98–111)
Creatinine, Ser: 0.59 mg/dL (ref 0.44–1.00)
GFR, Estimated: >60 mL/min (ref >60)
Glucose, Bld: 148 mg/dL — ABNORMAL HIGH (ref 70–99)
Potassium: 3.9 mmol/L (ref 3.5–5.1)
Sodium: 151 mmol/L — ABNORMAL HIGH (ref 135–145)

## 2021-05-20 LAB — POCT I-STAT 7, (LYTES, BLD GAS, ICA,H+H)
Acid-Base Excess: 10 mmol/L — ABNORMAL HIGH (ref 0.0–2.0)
Bicarbonate: 35.8 mmol/L — ABNORMAL HIGH (ref 20.0–28.0)
Calcium, Ion: 1.1 mmol/L — ABNORMAL LOW (ref 1.15–1.40)
HCT: 29 % — ABNORMAL LOW (ref 36.0–46.0)
Hemoglobin: 9.9 g/dL — ABNORMAL LOW (ref 12.0–15.0)
O2 Saturation: 100 %
Patient temperature: 99.3
Potassium: 3.7 mmol/L (ref 3.5–5.1)
Sodium: 153 mmol/L — ABNORMAL HIGH (ref 135–145)
TCO2: 37 mmol/L — ABNORMAL HIGH (ref 22–32)
pCO2 arterial: 55.5 mmHg — ABNORMAL HIGH (ref 32–48)
pH, Arterial: 7.419 (ref 7.35–7.45)
pO2, Arterial: 212 mmHg — ABNORMAL HIGH (ref 83–108)

## 2021-05-20 LAB — GLUCOSE, CAPILLARY
Glucose-Capillary: 136 mg/dL — ABNORMAL HIGH (ref 70–99)
Glucose-Capillary: 122 mg/dL — ABNORMAL HIGH (ref 70–99)
Glucose-Capillary: 132 mg/dL — ABNORMAL HIGH (ref 70–99)
Glucose-Capillary: 173 mg/dL — ABNORMAL HIGH (ref 70–99)
Glucose-Capillary: 149 mg/dL — ABNORMAL HIGH (ref 70–99)
Glucose-Capillary: 153 mg/dL — ABNORMAL HIGH (ref 70–99)

## 2021-05-20 MED ORDER — LEVETIRACETAM 100 MG/ML PO SOLN
750.0000 mg | Freq: Two times a day (BID) | ORAL | Status: DC
  Administered 2021-05-20 – 2021-06-15 (×52): 750 mg
  Filled 2021-05-20 (×52): qty 10

## 2021-05-20 MED ORDER — LOPERAMIDE HCL 2 MG PO CAPS
4.0000 mg | ORAL_CAPSULE | Freq: Two times a day (BID) | ORAL | Status: DC
Start: 2021-05-20 — End: 2021-05-20

## 2021-05-20 MED ORDER — NOREPINEPHRINE 4 MG/250ML-% IV SOLN
2.0000 ug/min | INTRAVENOUS | Status: DC
  Filled 2021-05-20: qty 250

## 2021-05-20 MED ORDER — FREE WATER
250.0000 mL | Status: DC
  Administered 2021-05-20 – 2021-05-23 (×17): 250 mL

## 2021-05-20 MED ORDER — LOPERAMIDE HCL 1 MG/7.5ML PO SUSP
4.0000 mg | Freq: Two times a day (BID) | ORAL | Status: DC
  Administered 2021-05-20 – 2021-05-25 (×12): 4 mg
  Filled 2021-05-20 (×12): qty 30

## 2021-05-20 MED ORDER — MIDAZOLAM HCL 2 MG/2ML IJ SOLN
2.0000 mg | INTRAMUSCULAR | Status: DC | PRN
  Administered 2021-05-20 – 2021-05-22 (×6): 2 mg via INTRAVENOUS
  Administered 2021-05-24: 1 mg via INTRAVENOUS
  Administered 2021-05-27 – 2021-05-28 (×3): 2 mg via INTRAVENOUS
  Administered 2021-05-28: 3 mg via INTRAVENOUS
  Administered 2021-06-07: 2 mg via INTRAVENOUS
  Filled 2021-05-20 (×7): qty 2

## 2021-05-20 MED ORDER — SODIUM CHLORIDE 0.9 % IV SOLN
250.0000 mL | INTRAVENOUS | Status: DC
Start: 2021-05-20 — End: 2021-05-23

## 2021-05-20 MED ORDER — FUROSEMIDE 10 MG/ML IJ SOLN
40.0000 mg | Freq: Once | INTRAMUSCULAR | Status: AC
  Administered 2021-05-20: 40 mg via INTRAVENOUS
  Filled 2021-05-20: qty 4

## 2021-05-20 MED ORDER — DEXMEDETOMIDINE HCL IN NACL 400 MCG/100ML IV SOLN
0.4000 ug/kg/h | INTRAVENOUS | Status: DC
  Administered 2021-05-20 (×2): 0.4 ug/kg/h via INTRAVENOUS
  Administered 2021-05-21: 0.8 ug/kg/h via INTRAVENOUS
  Administered 2021-05-21: 0.4 ug/kg/h via INTRAVENOUS
  Administered 2021-05-21: 0.8 ug/kg/h via INTRAVENOUS
  Administered 2021-05-22 (×3): 1.2 ug/kg/h via INTRAVENOUS
  Administered 2021-05-22: 0.7 ug/kg/h via INTRAVENOUS
  Administered 2021-05-22: 0.8 ug/kg/h via INTRAVENOUS
  Administered 2021-05-23 – 2021-05-26 (×25): 1.2 ug/kg/h via INTRAVENOUS
  Administered 2021-05-27 (×2): 0.8 ug/kg/h via INTRAVENOUS
  Administered 2021-05-27: 1.2 ug/kg/h via INTRAVENOUS
  Administered 2021-05-27: 1 ug/kg/h via INTRAVENOUS
  Administered 2021-05-27: 1.2 ug/kg/h via INTRAVENOUS
  Administered 2021-05-28: 0.6 ug/kg/h via INTRAVENOUS
  Administered 2021-05-28: 1 ug/kg/h via INTRAVENOUS
  Administered 2021-05-28: 0.8 ug/kg/h via INTRAVENOUS
  Administered 2021-05-29: 1.2 ug/kg/h via INTRAVENOUS
  Administered 2021-05-29 (×3): 1 ug/kg/h via INTRAVENOUS
  Administered 2021-05-29 – 2021-05-31 (×15): 1.2 ug/kg/h via INTRAVENOUS
  Administered 2021-06-01 (×2): 1.1 ug/kg/h via INTRAVENOUS
  Administered 2021-06-01: 0.8 ug/kg/h via INTRAVENOUS
  Administered 2021-06-01 – 2021-06-02 (×2): 0.6 ug/kg/h via INTRAVENOUS
  Filled 2021-05-20 (×28): qty 100
  Filled 2021-05-20: qty 200
  Filled 2021-05-20 (×13): qty 100
  Filled 2021-05-20: qty 200
  Filled 2021-05-20: qty 100
  Filled 2021-05-20: qty 200
  Filled 2021-05-20 (×8): qty 100
  Filled 2021-05-20: qty 200
  Filled 2021-05-20 (×3): qty 100
  Filled 2021-05-20: qty 200
  Filled 2021-05-20 (×4): qty 100

## 2021-05-20 NOTE — Progress Notes (Addendum)
? ?Trauma/Critical Care Follow Up Note ? ?Subjective:  ?  ?Overnight Issues:  ? ?Objective:  ?Vital signs for last 24 hours: ?Temp:  [98.7 ?F (37.1 ?C)-99.8 ?F (37.7 ?C)] 99.1 ?F (37.3 ?C) (03/24 0800) ?Pulse Rate:  [86-113] 111 (03/24 0800) ?Resp:  [15-33] 21 (03/24 0800) ?BP: (106-153)/(47-90) 130/62 (03/24 0800) ?SpO2:  [87 %-100 %] 97 % (03/24 0800) ?FiO2 (%):  [50 %-70 %] 70 % (03/24 0800) ?Weight:  [90.6 kg] 90.6 kg (03/24 0500) ? ?Hemodynamic parameters for last 24 hours: ?  ? ?Intake/Output from previous day: ?03/23 0701 - 03/24 0700 ?In: 4618.3 [I.V.:419.5; NG/GT:3700; IV Piggyback:498.7] ?Out: 2715 [Urine:1815; Stool:900]  ?Intake/Output this shift: ?Total I/O ?In: 163.7 [I.V.:30.1; NG/GT:110; IV Piggyback:23.6] ?Out: 350 [Urine:250; Stool:100] ? ?Vent settings for last 24 hours: ?Vent Mode: PRVC ?FiO2 (%):  [50 %-70 %] 70 % ?Set Rate:  [18 bmp] 18 bmp ?Vt Set:  [420 mL] 420 mL ?PEEP:  [8 cmH20] 8 cmH20 ?Pressure Support:  [8 cmH20] 8 cmH20 ?Plateau Pressure:  [16 cmH20-30 cmH20] 30 cmH20 ? ?Physical Exam:  ?Gen: comfortable, no distress ?Neuro: non-focal exam, interactive, writing, f/c ?HEENT: PERRL ?Neck: supple ?CV: RRR ?Pulm: unlabored breathing ?Abd: soft, NT, incision c/d/i ?GU: clear yellow urine ?Extr: wwp, no edema ? ? ?Results for orders placed or performed during the hospital encounter of 05/05/21 (from the past 24 hour(s))  ?Glucose, capillary     Status: Abnormal  ? Collection Time: 05/19/21 11:33 AM  ?Result Value Ref Range  ? Glucose-Capillary 145 (H) 70 - 99 mg/dL  ?Glucose, capillary     Status: Abnormal  ? Collection Time: 05/19/21  3:55 PM  ?Result Value Ref Range  ? Glucose-Capillary 141 (H) 70 - 99 mg/dL  ?Glucose, capillary     Status: Abnormal  ? Collection Time: 05/19/21  7:23 PM  ?Result Value Ref Range  ? Glucose-Capillary 132 (H) 70 - 99 mg/dL  ?Glucose, capillary     Status: Abnormal  ? Collection Time: 05/19/21 11:17 PM  ?Result Value Ref Range  ? Glucose-Capillary 109 (H) 70  - 99 mg/dL  ?CBC     Status: Abnormal  ? Collection Time: 05/20/21  2:48 AM  ?Result Value Ref Range  ? WBC 23.0 (H) 4.0 - 10.5 K/uL  ? RBC 2.90 (L) 3.87 - 5.11 MIL/uL  ? Hemoglobin 8.6 (L) 12.0 - 15.0 g/dL  ? HCT 27.0 (L) 36.0 - 46.0 %  ? MCV 93.1 80.0 - 100.0 fL  ? MCH 29.7 26.0 - 34.0 pg  ? MCHC 31.9 30.0 - 36.0 g/dL  ? RDW 15.5 11.5 - 15.5 %  ? Platelets 712 (H) 150 - 400 K/uL  ? nRBC 0.1 0.0 - 0.2 %  ?Basic metabolic panel     Status: Abnormal  ? Collection Time: 05/20/21  2:48 AM  ?Result Value Ref Range  ? Sodium 151 (H) 135 - 145 mmol/L  ? Potassium 3.9 3.5 - 5.1 mmol/L  ? Chloride 113 (H) 98 - 111 mmol/L  ? CO2 33 (H) 22 - 32 mmol/L  ? Glucose, Bld 148 (H) 70 - 99 mg/dL  ? BUN 11 6 - 20 mg/dL  ? Creatinine, Ser 0.59 0.44 - 1.00 mg/dL  ? Calcium 8.2 (L) 8.9 - 10.3 mg/dL  ? GFR, Estimated >60 >60 mL/min  ? Anion gap 5 5 - 15  ?Glucose, capillary     Status: Abnormal  ? Collection Time: 05/20/21  3:34 AM  ?Result Value Ref Range  ? Glucose-Capillary 136 (  H) 70 - 99 mg/dL  ?Glucose, capillary     Status: Abnormal  ? Collection Time: 05/20/21  7:40 AM  ?Result Value Ref Range  ? Glucose-Capillary 122 (H) 70 - 99 mg/dL  ? ? ?Assessment & Plan: ? ?Present on Admission: ? Neuroendocrine carcinoma ([place]) ? ? ? LOS: 15 days  ? ?Additional comments:I reviewed the patient's new clinical lab test results.   and I reviewed the patients new imaging test results.   ? ?Neuroendocrine tumor on colonoscopy - s/p exlap, R hemicolectomy, cholecystectomy 3/9. Well differentiated neuroendocrine tumor with 1/7 +nodes. D/w Dr. Marin Olp 3/15 will likely need adjuvant chemo. Staples out 3/24. ?Respiratory failure 2/2 volume overload - CT chest 3/11 and 3/13 with no PE. Intubated 3/16, suspect PNA based on CXR but negative cx, bronch with BAL 3/21, cx NRF, empiric merrem started 3/21 continue x5d ?Volume overload - euvolemic now clinically, but persistent airspace disease suspicious for PNA ?Altered mental status - CT head 3/13, 3/16/  3/17 normal (h/o VP shunt). Changed cefepime to zosyn 3/13, total 7d course-end today, empiric coverage for PNA. Resp cx today. Ucx 3/13 negative. Bcx 3/15 NGTD. LP 3/16 NGTD. CT A/P 3/16 negative. 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 Neurology on board, changed to keppra, cont EEG without sz, encephalopathy much improved, d/c'd imodium to encourage NH4 loss, restart today ?vWD - humate P per Heme, end 3/12, hypercoagulable on TEG 3/13 ?Transaminitis - likely 2/2 TPN, d/c as soon as possible ?FEN - NPO, OGT, tol TF at goal, FWF added yets for hypernatremia, increased today, d/w RPh medication impact-transition keppra to PO ?DVT - SCDs, s/p DDVAP, LMWH, LE duplex neg for DVT 3/13 ?Foley - replaced 3/18, d/c today ?Dispo - ICU ? ?Critical Care Total Time: 35 minutes ? ?[name], MD ?Trauma & General Surgery ?Please use [email or web address] to contact on call provider ? ?05/20/2021 ? ?*Care during the described time interval was provided by me. I have reviewed this patient's available data, including medical history, events of note, physical examination and test results as part of my evaluation. ? ? ? ?

## 2021-05-20 NOTE — Progress Notes (Signed)
PT Cancellation Note ? ?Patient Details ?Name: Cassidy Tran ?MRN: [id number] ?DOB: 10-04-1975 ? ? ?Cancelled Treatment:    Reason Eval/Treat Not Completed: Medical issues which prohibited therapy. Pt intubated and on 100% FiO2. PT will follow up when pt is on lower vent settings and better able to tolerate functional mobility. ? ? ?Zenaida Niece ?05/20/2021, 2:52 PM ?

## 2021-05-20 NOTE — Progress Notes (Signed)
Patient began desat to 85%, RR 45, tachycardic, and restless/ Patient switched back to [facility] mode on ventilator. RT notified. Patient now more calm and RR 25-30. Will continue to monitor. ? ?Montez Hageman RN ?

## 2021-05-21 ENCOUNTER — Inpatient Hospital Stay (HOSPITAL_COMMUNITY): Payer: No Typology Code available for payment source

## 2021-05-21 LAB — URINALYSIS, ROUTINE W REFLEX MICROSCOPIC
Bacteria, UA: NONE SEEN
Bilirubin Urine: NEGATIVE
Glucose, UA: NEGATIVE mg/dL
Ketones, ur: NEGATIVE mg/dL
Nitrite: NEGATIVE
Protein, ur: NEGATIVE mg/dL
Specific Gravity, Urine: 1.018 (ref 1.005–1.030)
pH: 5 (ref 5.0–8.0)

## 2021-05-21 LAB — CBC
HCT: 28.3 % — ABNORMAL LOW (ref 36.0–46.0)
Hemoglobin: 8.8 g/dL — ABNORMAL LOW (ref 12.0–15.0)
MCH: 28.6 pg (ref 26.0–34.0)
MCHC: 31.1 g/dL (ref 30.0–36.0)
MCV: 91.9 fL (ref 80.0–100.0)
Platelets: 746 10*3/uL — ABNORMAL HIGH (ref 150–400)
RBC: 3.08 MIL/uL — ABNORMAL LOW (ref 3.87–5.11)
RDW: 15.4 % (ref 11.5–15.5)
WBC: 21.5 10*3/uL — ABNORMAL HIGH (ref 4.0–10.5)
nRBC: 0.1 % (ref 0.0–0.2)

## 2021-05-21 LAB — BASIC METABOLIC PANEL
Anion gap: 8 (ref 5–15)
BUN: 19 mg/dL (ref 6–20)
CO2: 29 mmol/L (ref 22–32)
Calcium: 8.2 mg/dL — ABNORMAL LOW (ref 8.9–10.3)
Chloride: 113 mmol/L — ABNORMAL HIGH (ref 98–111)
Creatinine, Ser: 0.66 mg/dL (ref 0.44–1.00)
GFR, Estimated: >60 mL/min (ref >60)
Glucose, Bld: 146 mg/dL — ABNORMAL HIGH (ref 70–99)
Potassium: 3.9 mmol/L (ref 3.5–5.1)
Sodium: 150 mmol/L — ABNORMAL HIGH (ref 135–145)

## 2021-05-21 LAB — GLUCOSE, CAPILLARY
Glucose-Capillary: 153 mg/dL — ABNORMAL HIGH (ref 70–99)
Glucose-Capillary: 113 mg/dL — ABNORMAL HIGH (ref 70–99)
Glucose-Capillary: 129 mg/dL — ABNORMAL HIGH (ref 70–99)
Glucose-Capillary: 145 mg/dL — ABNORMAL HIGH (ref 70–99)
Glucose-Capillary: 142 mg/dL — ABNORMAL HIGH (ref 70–99)

## 2021-05-21 MED ORDER — CHOLESTYRAMINE 4 G PO PACK
4.0000 g | PACK | Freq: Two times a day (BID) | ORAL | Status: DC
  Administered 2021-05-21 – 2021-05-22 (×3): 4 g via ORAL
  Filled 2021-05-21 (×5): qty 1

## 2021-05-21 MED ORDER — NOREPINEPHRINE 4 MG/250ML-% IV SOLN
2.0000 ug/min | INTRAVENOUS | Status: DC
  Administered 2021-05-21: 2 ug/min via INTRAVENOUS
  Administered 2021-05-22: 6 ug/min via INTRAVENOUS
  Administered 2021-05-23: 3 ug/min via INTRAVENOUS
  Administered 2021-05-23: 6 ug/min via INTRAVENOUS
  Filled 2021-05-21 (×3): qty 250

## 2021-05-21 MED ORDER — OXYCODONE HCL 5 MG/5ML PO SOLN
5.0000 mg | ORAL | Status: DC
  Administered 2021-05-21 – 2021-05-27 (×35): 5 mg
  Filled 2021-05-21 (×35): qty 5

## 2021-05-21 MED ORDER — FUROSEMIDE 10 MG/ML IJ SOLN
40.0000 mg | Freq: Once | INTRAMUSCULAR | Status: AC
  Administered 2021-05-21: 40 mg via INTRAVENOUS
  Filled 2021-05-21: qty 4

## 2021-05-21 NOTE — Progress Notes (Signed)
Pharmacy Antibiotic Note ? ?Cassidy Tran is a 46 y.o. female admitted on 05/05/2021 with pneumonia.  Pharmacy has been consulted for meropenem dosing. Patient previously on Zosyn for 7 days through 3/18; started Meropenem for intermittent fevers and WBC trended up to 32 on 3/21.  ? ?SCr remains stable. WBC down to  21.5, all cx and BAL negative. ? ?Plan: ?Continue Meropenem 1g IV q8h ?Monitor clinical progress, c/s, renal function ?Consider de-escalate abx vs d/c altogether ? ? ?Height: _0  (160 cm) ?Weight: 90.6 kg (199 lb 11.8 oz) ?IBW/kg (Calculated) : 52.4 ? ?Temp (24hrs), Avg:99.2 ?F (37.3 ?C), Min:98.8 ?F (37.1 ?C), Max:99.8 ?F (37.7 ?C) ? ?Recent Labs  ?Lab 05/17/21 ?0455 05/17/21 ?8546 05/18/21 ?2703 05/19/21 ?5009 05/20/21 ?3818 05/21/21 ?2993 05/21/21 ?1027  ?WBC  --  32.0* 31.0* 28.5* 23.0* 21.5*  --   ?CREATININE 0.54  --  0.59 0.52 0.59  --  0.66  ? ?  ?Estimated Creatinine Clearance: 92.9 mL/min (by C-G formula based on SCr of 0.66 mg/dL).   ? ?Allergies  ?Allergen Reactions  ? Aspirin Other (See Comments)  ?  Childhood reaction  ? Codeine Nausea And Vomiting  ?  Tolerates Hydrocodone  ? Erythromycin Other (See Comments)  ?  Childhood reaction, tolerate Zpak ?Childhood reaction does not know.  ? Lactose Intolerance (Gi) Diarrhea  ? Nsaids Nausea And Vomiting  ? Penicillins Other (See Comments)  ?  Has patient had a PCN reaction causing immediate rash, facial/tongue/throat swelling, SOB or lightheadedness with hypotension: doesn't remember childhood Reaction  ?Has patient had a PCN reaction causing severe rash involving mucus membranes or skin necrosis: NO ?Has patient had a PCN reaction that required hospitalization NO ?Has patient had a PCN reaction occurring within the last 10 years: NO ?If all of the above answers are "NO", then may proceed with Cephalosporin use. ?  ? ? ?Cefepime 3/11 >> 3/13 ?Zosyn 3/13 >> 3/18 ?Meropenem 3/21>> ?  ?3/13 UC - neg ?3/15 BCx - neg ?3/16- CSF cell ct - too few to  count ?3/16 CSF cx - neg ?3/18 TA - few candida ?3/21 BAL-50kcol nl resp flora ? ? ?Sherlon Handing, PharmD, BCPS ?Please see amion for complete clinical pharmacist phone list ?05/21/2021 1:27 PM ?

## 2021-05-21 NOTE — Progress Notes (Addendum)
PT Cancellation Note ? ?Patient Details ?Name: Cassidy Tran ?MRN: [id number] ?DOB: March 20, 1975 ? ? ?Cancelled Treatment:    Reason Eval/Treat Not Completed: Medical issues which prohibited therapy. Pt on 70% FiO2 on vent at this time. PT will follow up once oxygen needs are lessened and pt is better able to tolerate functional mobility. ? ? ?Zenaida Niece ?05/21/2021, 1:24 PM ?

## 2021-05-21 NOTE — Progress Notes (Signed)
An USGPIV (ultrasound guided PIV) has been placed for short-term vasopressor infusion. A correctly placed ivWatch must be used when administering Vasopressors. Should this treatment be needed beyond 72 hours, central line access should be obtained.  It will be the responsibility of the bedside nurse to follow best practice to prevent extravasations.   ?

## 2021-05-21 NOTE — Progress Notes (Signed)
? ?Trauma/Critical Care Follow Up Note ? ?Subjective:  ?  ?Overnight Issues:  ? ?Objective:  ?Vital signs for last 24 hours: ?Temp:  [98.8 ?F (37.1 ?C)-99.8 ?F (37.7 ?C)] 98.8 ?F (37.1 ?C) (03/25 0800) ?Pulse Rate:  [58-105] 69 (03/25 0800) ?Resp:  [18-28] 24 (03/25 0800) ?BP: (86-178)/(43-100) 128/52 (03/25 0800) ?SpO2:  [86 %-100 %] 92 % (03/25 0800) ?FiO2 (%):  [50 %-100 %] 70 % (03/25 0759) ? ?Hemodynamic parameters for last 24 hours: ?  ? ?Intake/Output from previous day: ?03/24 0701 - 03/25 0700 ?In: 3552.5 [I.V.:368.9; NG/GT:2960; IV Piggyback:223.6] ?Out: 1425 [Urine:1025; Stool:400]  ?Intake/Output this shift: ?Total I/O ?In: 28.1 [I.V.:28.1] ?Out: -  ? ?Vent settings for last 24 hours: ?Vent Mode: PRVC ?FiO2 (%):  [50 %-100 %] 70 % ?Set Rate:  [15 bmp] 15 bmp ?Vt Set:  [410 EH-212248 mL] 410 mL ?PEEP:  [8 cmH20-10 cmH20] 10 cmH20 ?Pressure Support:  [8 cmH20] 8 cmH20 ?Plateau Pressure:  [16 cmH20-34 cmH20] 34 cmH20 ? ?Physical Exam:  ?Gen: comfortable, no distress ?Neuro: non-focal exam ?HEENT: PERRL ?Neck: supple ?CV: RRR ?Pulm: unlabored breathing ?Abd: soft, NT, staples ?GU: clear yellow urine ?Extr: wwp, no edema ? ? ?Results for orders placed or performed during the hospital encounter of 05/05/21 (from the past 24 hour(s))  ?Glucose, capillary     Status: Abnormal  ? Collection Time: 05/20/21 11:41 AM  ?Result Value Ref Range  ? Glucose-Capillary 132 (H) 70 - 99 mg/dL  ?Glucose, capillary     Status: Abnormal  ? Collection Time: 05/20/21  3:13 PM  ?Result Value Ref Range  ? Glucose-Capillary 173 (H) 70 - 99 mg/dL  ?I-STAT 7, (LYTES, BLD GAS, ICA, H+H)     Status: Abnormal  ? Collection Time: 05/20/21  4:29 PM  ?Result Value Ref Range  ? pH, Arterial 7.419 7.35 - 7.45  ? pCO2 arterial 55.5 (H) 32 - 48 mmHg  ? pO2, Arterial 212 (H) 83 - 108 mmHg  ? Bicarbonate 35.8 (H) 20.0 - 28.0 mmol/L  ? TCO2 37 (H) 22 - 32 mmol/L  ? O2 Saturation 100 %  ? Acid-Base Excess 10.0 (H) 0.0 - 2.0 mmol/L  ? Sodium 153 (H)  135 - 145 mmol/L  ? Potassium 3.7 3.5 - 5.1 mmol/L  ? Calcium, Ion 1.10 (L) 1.15 - 1.40 mmol/L  ? HCT 29.0 (L) 36.0 - 46.0 %  ? Hemoglobin 9.9 (L) 12.0 - 15.0 g/dL  ? Patient temperature 99.3 F   ? Collection site Brachial   ? Drawn by RT   ? Sample type ARTERIAL   ?Glucose, capillary     Status: Abnormal  ? Collection Time: 05/20/21  7:27 PM  ?Result Value Ref Range  ? Glucose-Capillary 149 (H) 70 - 99 mg/dL  ?Glucose, capillary     Status: Abnormal  ? Collection Time: 05/20/21 11:17 PM  ?Result Value Ref Range  ? Glucose-Capillary 153 (H) 70 - 99 mg/dL  ?CBC     Status: Abnormal  ? Collection Time: 05/21/21  3:23 AM  ?Result Value Ref Range  ? WBC 21.5 (H) 4.0 - 10.5 K/uL  ? RBC 3.08 (L) 3.87 - 5.11 MIL/uL  ? Hemoglobin 8.8 (L) 12.0 - 15.0 g/dL  ? HCT 28.3 (L) 36.0 - 46.0 %  ? MCV 91.9 80.0 - 100.0 fL  ? MCH 28.6 26.0 - 34.0 pg  ? MCHC 31.1 30.0 - 36.0 g/dL  ? RDW 15.4 11.5 - 15.5 %  ? Platelets 746 (H) 150 -  400 K/uL  ? nRBC 0.1 0.0 - 0.2 %  ?Glucose, capillary     Status: Abnormal  ? Collection Time: 05/21/21  3:23 AM  ?Result Value Ref Range  ? Glucose-Capillary 153 (H) 70 - 99 mg/dL  ?Glucose, capillary     Status: Abnormal  ? Collection Time: 05/21/21  7:34 AM  ?Result Value Ref Range  ? Glucose-Capillary 113 (H) 70 - 99 mg/dL  ? ? ?Assessment & Plan: ?The plan of care was discussed with the bedside nurse for the day, Marissa, who is in agreement with this plan and no additional concerns were raised.  ? ?Present on Admission: ? Neuroendocrine carcinoma ([place]) ? ? ? LOS: 16 days  ? ?Additional comments:I reviewed the patient's new clinical lab test results.   and I reviewed the patients new imaging test results.   ? ?Neuroendocrine tumor on colonoscopy - s/p exlap, R hemicolectomy, cholecystectomy 3/9. Well differentiated neuroendocrine tumor with 1/7 +nodes. D/w Dr. Marin Olp 3/15 will likely need adjuvant chemo. Staples out 3/24. ?Respiratory failure 2/2 volume overload - CT chest 3/11 and 3/13 with no PE.  Intubated 3/16, suspect PNA based on CXR but negative cx, bronch with BAL 3/21, cx NRF, empiric merrem started 3/21 continue x5d ?Volume overload - previously resolved, but 3/24 PM, went into flash pulmonary edema, rec'd lasix yest AM with suboptimal response ?Altered mental status - CT head 3/13, 3/16/ 3/17 normal (h/o VP shunt). Changed cefepime to zosyn 3/13, total 7d course-end today, empiric coverage for PNA. Resp cx today. Ucx 3/13 negative. Bcx 3/15 NGTD. LP 3/16 NGTD. CT A/P 3/16 negative. 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 Neurology on board, changed to keppra, cont EEG without sz, encephalopathy much improved, d/c'd imodium to encourage NH4 loss, restarted 3/25, cholestyramine added yest, BMs decreased and thicker ?vWD - humate P per Heme, end 3/12, stable ?Transaminitis - likely 2/2 TPN, now d/c'd, recheck 3/27 ?FEN - NPO, OGT, tol TF at goal, FWF incr for hypernatremia - improving, med reviewe done by RPh today ?DVT - SCDs, s/p DDVAP, LMWH, LE duplex neg for DVT 3/13 ?Foley - replaced 3/18, d/c'd 3/24, replaced ?Dispo - ICU ?  ? ?Critical Care Total Time: 45 minutes ? ?[name], MD ?Trauma & General Surgery ?Please use [email or web address] to contact on call provider ? ?05/21/2021 ? ?*Care during the described time interval was provided by me. I have reviewed this patient's available data, including medical history, events of note, physical examination and test results as part of my evaluation. ? ? ? ?

## 2021-05-22 ENCOUNTER — Inpatient Hospital Stay (HOSPITAL_COMMUNITY): Payer: No Typology Code available for payment source

## 2021-05-22 ENCOUNTER — Inpatient Hospital Stay: Payer: Self-pay

## 2021-05-22 LAB — POCT I-STAT 7, (LYTES, BLD GAS, ICA,H+H)
Acid-Base Excess: 4 mmol/L — ABNORMAL HIGH (ref 0.0–2.0)
Acid-Base Excess: 6 mmol/L — ABNORMAL HIGH (ref 0.0–2.0)
Bicarbonate: 32.2 mmol/L — ABNORMAL HIGH (ref 20.0–28.0)
Bicarbonate: 31.5 mmol/L — ABNORMAL HIGH (ref 20.0–28.0)
Calcium, Ion: 1.18 mmol/L (ref 1.15–1.40)
Calcium, Ion: 1.15 mmol/L (ref 1.15–1.40)
HCT: 28 % — ABNORMAL LOW (ref 36.0–46.0)
HCT: 25 % — ABNORMAL LOW (ref 36.0–46.0)
Hemoglobin: 9.5 g/dL — ABNORMAL LOW (ref 12.0–15.0)
Hemoglobin: 8.5 g/dL — ABNORMAL LOW (ref 12.0–15.0)
O2 Saturation: 92 %
O2 Saturation: 99 %
Patient temperature: 99.1
Patient temperature: 99.1
Potassium: 4.5 mmol/L (ref 3.5–5.1)
Potassium: 4.2 mmol/L (ref 3.5–5.1)
Sodium: 149 mmol/L — ABNORMAL HIGH (ref 135–145)
Sodium: 148 mmol/L — ABNORMAL HIGH (ref 135–145)
TCO2: 34 mmol/L — ABNORMAL HIGH (ref 22–32)
TCO2: 33 mmol/L — ABNORMAL HIGH (ref 22–32)
pCO2 arterial: 72.7 mmHg (ref 32–48)
pCO2 arterial: 51 mmHg — ABNORMAL HIGH (ref 32–48)
pH, Arterial: 7.256 — ABNORMAL LOW (ref 7.35–7.45)
pH, Arterial: 7.401 (ref 7.35–7.45)
pO2, Arterial: 78 mmHg — ABNORMAL LOW (ref 83–108)
pO2, Arterial: 161 mmHg — ABNORMAL HIGH (ref 83–108)

## 2021-05-22 LAB — BASIC METABOLIC PANEL
Anion gap: 7 (ref 5–15)
BUN: 18 mg/dL (ref 6–20)
CO2: 31 mmol/L (ref 22–32)
Calcium: 8.1 mg/dL — ABNORMAL LOW (ref 8.9–10.3)
Chloride: 111 mmol/L (ref 98–111)
Creatinine, Ser: 0.61 mg/dL (ref 0.44–1.00)
GFR, Estimated: >60 mL/min (ref >60)
Glucose, Bld: 158 mg/dL — ABNORMAL HIGH (ref 70–99)
Potassium: 3.7 mmol/L (ref 3.5–5.1)
Sodium: 149 mmol/L — ABNORMAL HIGH (ref 135–145)

## 2021-05-22 LAB — GLUCOSE, CAPILLARY
Glucose-Capillary: 116 mg/dL — ABNORMAL HIGH (ref 70–99)
Glucose-Capillary: 132 mg/dL — ABNORMAL HIGH (ref 70–99)
Glucose-Capillary: 133 mg/dL — ABNORMAL HIGH (ref 70–99)
Glucose-Capillary: 139 mg/dL — ABNORMAL HIGH (ref 70–99)
Glucose-Capillary: 140 mg/dL — ABNORMAL HIGH (ref 70–99)
Glucose-Capillary: 151 mg/dL — ABNORMAL HIGH (ref 70–99)
Glucose-Capillary: 165 mg/dL — ABNORMAL HIGH (ref 70–99)

## 2021-05-22 LAB — CBC
HCT: 25.6 % — ABNORMAL LOW (ref 36.0–46.0)
Hemoglobin: 7.9 g/dL — ABNORMAL LOW (ref 12.0–15.0)
MCH: 28.6 pg (ref 26.0–34.0)
MCHC: 30.9 g/dL (ref 30.0–36.0)
MCV: 92.8 fL (ref 80.0–100.0)
Platelets: 888 10*3/uL — ABNORMAL HIGH (ref 150–400)
RBC: 2.76 MIL/uL — ABNORMAL LOW (ref 3.87–5.11)
RDW: 15.2 % (ref 11.5–15.5)
WBC: 23.2 10*3/uL — ABNORMAL HIGH (ref 4.0–10.5)
nRBC: 0.2 % (ref 0.0–0.2)

## 2021-05-22 MED ORDER — ROCURONIUM BROMIDE 50 MG/5ML IV SOLN
100.0000 mg | Freq: Once | INTRAVENOUS | Status: AC
  Administered 2021-05-22: 100 mg via INTRAVENOUS

## 2021-05-22 MED ORDER — CHOLESTYRAMINE 4 G PO PACK
4.0000 g | PACK | Freq: Three times a day (TID) | ORAL | Status: DC
  Administered 2021-05-22 – 2021-06-03 (×33): 4 g
  Filled 2021-05-22 (×39): qty 1

## 2021-05-22 MED ORDER — MIDAZOLAM-SODIUM CHLORIDE 100-0.9 MG/100ML-% IV SOLN
0.5000 mg/h | INTRAVENOUS | Status: DC
  Administered 2021-05-22: 1 mg/h via INTRAVENOUS
  Administered 2021-05-22 – 2021-05-26 (×10): 10 mg/h via INTRAVENOUS
  Administered 2021-05-27: 5 mg/h via INTRAVENOUS
  Administered 2021-05-27: 10 mg/h via INTRAVENOUS
  Filled 2021-05-22 (×12): qty 100

## 2021-05-22 MED ORDER — MIDAZOLAM-SODIUM CHLORIDE 100-0.9 MG/100ML-% IV SOLN
INTRAVENOUS | Status: AC
  Filled 2021-05-22: qty 100

## 2021-05-22 MED ORDER — ALBUTEROL SULFATE (2.5 MG/3ML) 0.083% IN NEBU
2.5000 mg | INHALATION_SOLUTION | RESPIRATORY_TRACT | Status: DC
  Administered 2021-05-22 – 2021-05-24 (×13): 2.5 mg via RESPIRATORY_TRACT
  Filled 2021-05-22 (×13): qty 3

## 2021-05-22 MED ORDER — IOHEXOL 9 MG/ML PO SOLN
ORAL | Status: AC
  Administered 2021-05-22: 500 mL
  Filled 2021-05-22: qty 1000

## 2021-05-22 MED ORDER — ALBUTEROL SULFATE (2.5 MG/3ML) 0.083% IN NEBU
2.5000 mg | INHALATION_SOLUTION | RESPIRATORY_TRACT | Status: DC | PRN
  Administered 2021-05-24 – 2021-05-31 (×9): 2.5 mg via RESPIRATORY_TRACT
  Filled 2021-05-22 (×11): qty 3

## 2021-05-22 MED ORDER — ROCURONIUM BROMIDE 10 MG/ML (PF) SYRINGE
PREFILLED_SYRINGE | INTRAVENOUS | Status: AC
  Filled 2021-05-22: qty 10

## 2021-05-22 MED ORDER — ROCURONIUM BROMIDE 50 MG/5ML IV SOLN
100.0000 mg | Freq: Once | INTRAVENOUS | Status: AC
  Administered 2021-05-22: 100 mg via INTRAVENOUS
  Filled 2021-05-22: qty 10

## 2021-05-22 MED ORDER — ALBUTEROL SULFATE (2.5 MG/3ML) 0.083% IN NEBU
5.0000 mg | INHALATION_SOLUTION | Freq: Once | RESPIRATORY_TRACT | Status: AC
  Administered 2021-05-22: 5 mg via RESPIRATORY_TRACT
  Filled 2021-05-22: qty 6

## 2021-05-22 MED ORDER — SODIUM CHLORIDE 0.9 % IV SOLN: INTRAVENOUS | Status: DC | PRN

## 2021-05-22 MED ORDER — ARTIFICIAL TEARS OPHTHALMIC OINT
1.0000 "application " | TOPICAL_OINTMENT | Freq: Three times a day (TID) | OPHTHALMIC | Status: DC
  Administered 2021-05-24 – 2021-05-30 (×16): 1 via OPHTHALMIC
  Filled 2021-05-22: qty 3.5

## 2021-05-22 MED ORDER — VECURONIUM BROMIDE 10 MG IV SOLR
0.1000 mg/kg | INTRAVENOUS | Status: DC | PRN
  Administered 2021-05-24 – 2021-05-28 (×9): 9.1 mg via INTRAVENOUS
  Filled 2021-05-22 (×9): qty 10

## 2021-05-22 NOTE — Progress Notes (Signed)
Pt sitting straight up in bed. RR in 30's sats sustaining in 80's. Nods that she is anxious and just needs a break. Wrote to family 5 minutes previous that she is done and wants to quit. Dr. Bobbye Morton notified. Orders for versed drip given.  ?

## 2021-05-22 NOTE — Procedures (Signed)
? ?  Procedure Note ? ?Date: 05/22/2021 ? ?Procedure: bronchoscopy ? ?Pre-op diagnosis: airway obstruction ?Post-op diagnosis: thick mucoid obstruction of distal ETT ? ?Indication and clinical history: 30F with suspected proximal airway obstruction ? ?Surgeon: [name], MD ? ?Findings: thick mucoid obstruction of distal ETT, unable to clear with bronchoscope ?Specimen: none ? ?EBL: 0cc ? ?Description of procedure: The patient was positioned semi-recumbent. Time-out was performed verifying correct patient, procedure, and signature of informed consent. The patient was confirmed to be on 100% FiO2. The bronchoscope was inserted into the endotracheal tube and into the airway. Thick mucoid secretions were noted of the distal ETT. Attempts were made to suction these, but were unsuccessful and ultimately, the scope passed beyond this to evaluate the airway. Bronchoscopy was performed bilaterally and evaluation of the left side of the airway revealed healthy mucosa and no secretions. Evaluation of the right side of the airway revealed healthy mucosa and no secretions. Multiple attempts were made again to suction the obstructing secretions within the ETT, but were mostly unsuccessful. The patient did have some episodes of desaturation requiring the procedure to be paused and due to the amount of obstruction caused by the secretions and the difficulty in recovery of oxygenation, the decision was made to exchange the ETT. The bronchoscope was removed from the airway and the endotracheal tube.  ? ?[name], MD ?General and Trauma Surgery ?[facility]   ? ? ? ? ?Procedure Note ? ?Date: 05/22/2021 ? ?Procedure: bronchoscopy ? ?Pre-op diagnosis: post-intubation ?Post-op diagnosis: same ? ?Indication and clinical history: 30F s/p intubation ? ?Surgeon: [name], MD ? ?Findings: mild;y edematous airways ?Specimen: none ? ?EBL: 0cc ? ?Description of procedure: The patient was positioned  semi-recumbent. Time-out was performed verifying correct patient and procedure. The patient was confirmed to be on 100% FiO2. The bronchoscope was inserted into the endotracheal tube and into the airway. Bronchoscopy was performed bilaterally and evaluation of the left side of the airway revealed healthy but mildly edematous mucosa and mild amount of thin yellow-tinged secretions which were suctioned. Evaluation of the right side of the airway revealed healthy but mildly edematous mucosa and mild amount of thin yellow-tinged secretions which were suctioned. The bronchoscope was removed from the airway and the endotracheal tube. The patient tolerated the procedure well. There were no complications. Post procedure chest x-ray was ordered.  ? ? ?[name], MD ?General and Trauma Surgery ?[facility]   ? ? ? ? ? ? ?

## 2021-05-22 NOTE — Progress Notes (Signed)
RT x2, RN x3 and MD at bedside for bedside bronchoscopy. During procedure pt's SpO2 decreased and HR decreased as well. Pt did have thick secrections in ETT. MD made decision to exchange ETT. ETT was exchange with an 8.0 tube with positive color change and placed pt back on full support on ventilator with good volume returns. During that time pt's saturation continued to drop as low as 24% with decrease in HR as well. ETT tube was removed and MD BMV pt. MD was able to insert new 8.0 ETT with slow recovery of pt's saturations and improvement to pt's HR. Pt is stable at this time and on full ventilator support. RT will continue to monitor pt. ?

## 2021-05-22 NOTE — Progress Notes (Signed)
Pt vent alarm continuously alarming peak pressure high. Hard to suction pt. Pt on 7 versed and 1.2 of precedex. RR in 30's. RT called. Vent settings adjusted, tubing and filter changed and suctioned with no change. Dr. Bobbye Morton notified. Orders for roc x 1 given. RT again adjusted vent. Peak pressures still 40/41. Dr. Bobbye Morton notified. Orders for chest PT given and to notify her if doesn't change. Peak pressures unchanged. Message sent to Dr. Bobbye Morton. ?

## 2021-05-22 NOTE — Procedures (Signed)
Intubation Procedure Note ? ?Fumiye Lubben  ?[id number]  ?[birth year] ? ?Date:05/22/21  ?Time:7:33 PM  ? ?Provider Performing: [name]  ? ? ?Procedure: Intubation ([id number]) ? ?Indication(s) ?Respiratory Failure ? ?Consent ?Unable to obtain consent due to emergent nature of procedure. ? ? ?Anesthesia ?Continuous sedation ? ? ?Time Out ?Verified patient identification, verified procedure, site/side was marked, verified correct patient position, special equipment/implants available, medications/allergies/relevant history reviewed, required imaging and test results available. ? ? ?Sterile Technique ?Usual hand hygeine, masks, and gloves were used ? ? ?Procedure Description ?Patient positioned in bed supine. Sedation as noted above. Initial attempt made for tube exchange using a bougie. Due to copious secretions along the length of the bougie, bougie was retracted during replacement of ETT. ETT ultimately determine to not be in the airway and was removed. Patient was then intubated with endotracheal tube using Glidescope.  View was Grade 1 full glottis .  Number of attempts was 1.  Colorimetric CO2 detector was consistent with tracheal placement. ? ? ?Complications/Tolerance ?None; patient tolerated the procedure well. ?Chest X-ray is ordered to verify placement. ? ? ?EBL ?None ? ? ?Specimen(s) ?None ? ?

## 2021-05-22 NOTE — Progress Notes (Signed)
RT called by RN stating that pt's ventilator alarm was going off. RT at bedside to to find that pt's peak pressures were in the 50's. RT attempted to suction pt with 10cc saline, with no improvement and small amount of tan secretions. Pt had increased WOB. Pt's SpO2 maintain in low 90's while on FiO2 OF 60%. RT removed pt from full ventilator support and manually bagged lavaged pt with no secretions noted and no improvement of peak pressures. RN was at bedside with RT during this time. RT replaced all filters of the ventilator in hopes to see improvement, but did not. RT notified MD of situation. MD requested RT to provide CPT for pt and to reassess afterward. There was no improvement after CPT was given. MD notified. ?

## 2021-05-22 NOTE — Progress Notes (Signed)
? ?Trauma/Critical Care Follow Up Note ? ?Subjective:  ?  ?Overnight Issues:  ? ?Objective:  ?Vital signs for last 24 hours: ?Temp:  [97.9 ?F (36.6 ?C)-98.5 ?F (36.9 ?C)] 98.4 ?F (36.9 ?C) (03/26 0800) ?Pulse Rate:  [55-78] 78 (03/26 1100) ?Resp:  [20-33] 33 (03/26 1100) ?BP: (84-124)/(35-90) 122/68 (03/26 1100) ?SpO2:  [82 %-100 %] 82 % (03/26 1100) ?FiO2 (%):  [60 %-70 %] 60 % (03/26 0828) ? ?Hemodynamic parameters for last 24 hours: ?  ? ?Intake/Output from previous day: ?03/25 0701 - 03/26 0700 ?In: 2471.4 [I.V.:313.4; NG/GT:2058; IV Piggyback:100] ?Out: 2025 [Urine:1625; Stool:400]  ?Intake/Output this shift: ?No intake/output data recorded. ? ?Vent settings for last 24 hours: ?Vent Mode: PRVC ?FiO2 (%):  [60 %-70 %] 60 % ?Set Rate:  [15 bmp] 15 bmp ?Vt Set:  [410 mL] 410 mL ?PEEP:  [10 cmH20] 10 cmH20 ?Plateau Pressure:  [20 cmH20-33 cmH20] 25 cmH20 ? ?Physical Exam:  ?Gen: comfortable, no distress ?Neuro: non-focal exam ?HEENT: PERRL ?Neck: supple ?CV: RRR ?Pulm: unlabored breathing ?Abd: soft, NT, inferior aspect of wound packed ?GU: clear yellow urine ?Extr: wwp, no edema ? ? ?Results for orders placed or performed during the hospital encounter of 05/05/21 (from the past 24 hour(s))  ?Glucose, capillary     Status: Abnormal  ? Collection Time: 05/21/21  3:21 PM  ?Result Value Ref Range  ? Glucose-Capillary 145 (H) 70 - 99 mg/dL  ?Glucose, capillary     Status: Abnormal  ? Collection Time: 05/21/21  7:38 PM  ?Result Value Ref Range  ? Glucose-Capillary 142 (H) 70 - 99 mg/dL  ? Comment 1 Notify RN   ? Comment 2 Document in Chart   ?Glucose, capillary     Status: Abnormal  ? Collection Time: 05/22/21 12:12 AM  ?Result Value Ref Range  ? Glucose-Capillary 165 (H) 70 - 99 mg/dL  ?Glucose, capillary     Status: Abnormal  ? Collection Time: 05/22/21  3:17 AM  ?Result Value Ref Range  ? Glucose-Capillary 140 (H) 70 - 99 mg/dL  ?CBC     Status: Abnormal  ? Collection Time: 05/22/21  7:14 AM  ?Result Value Ref Range   ? WBC 23.2 (H) 4.0 - 10.5 K/uL  ? RBC 2.76 (L) 3.87 - 5.11 MIL/uL  ? Hemoglobin 7.9 (L) 12.0 - 15.0 g/dL  ? HCT 25.6 (L) 36.0 - 46.0 %  ? MCV 92.8 80.0 - 100.0 fL  ? MCH 28.6 26.0 - 34.0 pg  ? MCHC 30.9 30.0 - 36.0 g/dL  ? RDW 15.2 11.5 - 15.5 %  ? Platelets 888 (H) 150 - 400 K/uL  ? nRBC 0.2 0.0 - 0.2 %  ?Basic metabolic panel     Status: Abnormal  ? Collection Time: 05/22/21  7:14 AM  ?Result Value Ref Range  ? Sodium 149 (H) 135 - 145 mmol/L  ? Potassium 3.7 3.5 - 5.1 mmol/L  ? Chloride 111 98 - 111 mmol/L  ? CO2 31 22 - 32 mmol/L  ? Glucose, Bld 158 (H) 70 - 99 mg/dL  ? BUN 18 6 - 20 mg/dL  ? Creatinine, Ser 0.61 0.44 - 1.00 mg/dL  ? Calcium 8.1 (L) 8.9 - 10.3 mg/dL  ? GFR, Estimated >60 >60 mL/min  ? Anion gap 7 5 - 15  ?Glucose, capillary     Status: Abnormal  ? Collection Time: 05/22/21  7:38 AM  ?Result Value Ref Range  ? Glucose-Capillary 151 (H) 70 - 99 mg/dL  ?Glucose, capillary  Status: Abnormal  ? Collection Time: 05/22/21 11:22 AM  ?Result Value Ref Range  ? Glucose-Capillary 133 (H) 70 - 99 mg/dL  ? ? ?Assessment & Plan: ?The plan of care was discussed with the bedside nurse for the day, Marissa, who is in agreement with this plan and no additional concerns were raised.  ? ?Present on Admission: ? Neuroendocrine carcinoma ([place]) ? ? ? LOS: 17 days  ? ?Additional comments:I reviewed the patient's new clinical lab test results.   and I reviewed the patients new imaging test results.   ? ?Neuroendocrine tumor on colonoscopy - s/p exlap, R hemicolectomy, cholecystectomy 3/9. Well differentiated neuroendocrine tumor with 1/7 +nodes. D/w Dr. Marin Olp 3/15 will likely need adjuvant chemo. Staples out 3/24. Bottom 2cm of wound packed. ?Respiratory failure 2/2 volume overload - CT chest 3/11 and 3/13 with no PE. Intubated 3/16, suspect PNA based on CXR but negative cx, bronch with BAL 3/21, cx NRF, empiric merrem started 3/21 continue x5d ?Volume overload - previously resolved, but 3/24 PM, went into flash  pulmonary edema, rec'd lasix yest AM with suboptimal response ?Altered mental status - CT head 3/13, 3/16/ 3/17 normal (h/o VP shunt). Changed cefepime to zosyn 3/13, total 7d course-end today, empiric coverage for PNA. Resp cx today. Ucx 3/13 negative. Bcx 3/15 NGTD. LP 3/16 NGTD. CT A/P 3/16 negative. 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 Neurology on board, changed to keppra, cont EEG without sz, encephalopathy much improved, d/c'd imodium to encourage NH4 loss, restarted 3/25, cholestyramine added yest, BMs decreased and thicker ?vWD - humate P per Heme, end 3/12, stable ?Transaminitis - likely 2/2 TPN, now d/c'd, recheck 3/27 ?Leukocytosis - worsening despite merrem, CT CAP today ?FEN - NPO, OGT, tol TF at goal, FWF incr for hypernatremia improving, med review done by RPh today ?DVT - SCDs, s/p DDVAP, LMWH, LE duplex neg for DVT 3/13 ?Foley - replaced 3/18, d/c'd 3/24, replaced 3/25 ?Dispo - ICU   ? ?Critical Care Total Time: 40 minutes ? ?[name], MD ?Trauma & General Surgery ?Please use [email or web address] to contact on call provider ? ?05/22/2021 ? ?*Care during the described time interval was provided by me. I have reviewed this patient's available data, including medical history, events of note, physical examination and test results as part of my evaluation. ? ? ? ?

## 2021-05-22 NOTE — Progress Notes (Signed)
PT Cancellation Note ? ?Patient Details ?Name: Cassidy Tran ?MRN: [id number] ?DOB: [birth year] ? ? ?Cancelled Treatment:    Reason Eval/Treat Not Completed: Medical issues which prohibited therapy. Pt on 100% FiO2 on the vent. PT will follow up when pt is on lower vent settings. ? ? ?Zenaida Niece ?05/22/2021, 3:18 PM ?

## 2021-05-22 NOTE — Progress Notes (Signed)
Two RT's attempted to insert arterial line.  RT's was not able to thread the catheter once flash was achieved.  RN was notified. ?

## 2021-05-22 NOTE — Progress Notes (Signed)
Notified of elevated peak pressures refractory to RT interventions including lavage and chest PT as well as chemical paralytic. Consent obtained from husband for bronchoscopy. Bronch performed with thick obstructing secretions within the ETT only. Unable to clear with aggressive suctioning and lavage. Procedure paused at least twice due to hypoxia. Difficult recovery of sats, so decision made to exchange ETT. Tube exchanged over bougie, however bougie retracted during manipulation due to excessive secretions on the tube. ETT connected to vent, colorimeter with color change and return volumes received on vent. Sats continue to drop, Glidescope inserted and ETT visualized in the esophagus. Tube removed, BMV initiated. ETT replaced using Glidescope, single attempt, grade 1 view. Sats slowly improved and patient replaced on the ventilator. Bronchoscopy performed after improvement of sats, minimal thin secretions suctioned. TV lowered to closer to 6cc/kg (340), RR 20, FiO2 100%, and PEEP18. Peak pressures remain in the 40s, albuterol treatment initiated, CXR and ABG to follow. Will continue paralytic.  ? ?Additional critical care time: 42mn ? ?AJesusita Oka MD ?General and Trauma Surgery ?[facility] ? ?

## 2021-05-23 ENCOUNTER — Inpatient Hospital Stay (HOSPITAL_COMMUNITY): Payer: No Typology Code available for payment source

## 2021-05-23 LAB — BASIC METABOLIC PANEL
Anion gap: 6 (ref 5–15)
BUN: 14 mg/dL (ref 6–20)
CO2: 29 mmol/L (ref 22–32)
Calcium: 8.1 mg/dL — ABNORMAL LOW (ref 8.9–10.3)
Chloride: 111 mmol/L (ref 98–111)
Creatinine, Ser: 0.68 mg/dL (ref 0.44–1.00)
GFR, Estimated: >60 mL/min (ref >60)
Glucose, Bld: 121 mg/dL — ABNORMAL HIGH (ref 70–99)
Potassium: 4.1 mmol/L (ref 3.5–5.1)
Sodium: 146 mmol/L — ABNORMAL HIGH (ref 135–145)

## 2021-05-23 LAB — CBC
HCT: 26.9 % — ABNORMAL LOW (ref 36.0–46.0)
Hemoglobin: 8.3 g/dL — ABNORMAL LOW (ref 12.0–15.0)
MCH: 28.6 pg (ref 26.0–34.0)
MCHC: 30.9 g/dL (ref 30.0–36.0)
MCV: 92.8 fL (ref 80.0–100.0)
Platelets: 892 10*3/uL — ABNORMAL HIGH (ref 150–400)
RBC: 2.9 MIL/uL — ABNORMAL LOW (ref 3.87–5.11)
RDW: 15 % (ref 11.5–15.5)
WBC: 27.5 10*3/uL — ABNORMAL HIGH (ref 4.0–10.5)
nRBC: 0.2 % (ref 0.0–0.2)

## 2021-05-23 LAB — GLUCOSE, CAPILLARY
Glucose-Capillary: 138 mg/dL — ABNORMAL HIGH (ref 70–99)
Glucose-Capillary: 151 mg/dL — ABNORMAL HIGH (ref 70–99)
Glucose-Capillary: 149 mg/dL — ABNORMAL HIGH (ref 70–99)
Glucose-Capillary: 150 mg/dL — ABNORMAL HIGH (ref 70–99)
Glucose-Capillary: 121 mg/dL — ABNORMAL HIGH (ref 70–99)
Glucose-Capillary: 137 mg/dL — ABNORMAL HIGH (ref 70–99)

## 2021-05-23 LAB — POCT I-STAT 7, (LYTES, BLD GAS, ICA,H+H)
Acid-Base Excess: 3 mmol/L — ABNORMAL HIGH (ref 0.0–2.0)
Bicarbonate: 28.5 mmol/L — ABNORMAL HIGH (ref 20.0–28.0)
Calcium, Ion: 1.15 mmol/L (ref 1.15–1.40)
HCT: 23 % — ABNORMAL LOW (ref 36.0–46.0)
Hemoglobin: 7.8 g/dL — ABNORMAL LOW (ref 12.0–15.0)
O2 Saturation: 61 %
Patient temperature: 99.1
Potassium: 3.9 mmol/L (ref 3.5–5.1)
Sodium: 143 mmol/L (ref 135–145)
TCO2: 30 mmol/L (ref 22–32)
pCO2 arterial: 45.4 mmHg (ref 32–48)
pH, Arterial: 7.407 (ref 7.35–7.45)
pO2, Arterial: 32 mmHg — CL (ref 83–108)

## 2021-05-23 MED ORDER — FREE WATER
250.0000 mL | Freq: Four times a day (QID) | Status: DC
  Administered 2021-05-23 – 2021-05-24 (×3): 250 mL

## 2021-05-23 MED ORDER — FUROSEMIDE 10 MG/ML IJ SOLN
4.0000 mg/h | INTRAVENOUS | Status: DC
  Administered 2021-05-23: 2 mg/h via INTRAVENOUS
  Administered 2021-05-25: 6 mg/h via INTRAVENOUS
  Administered 2021-05-26: 5 mg/h via INTRAVENOUS
  Administered 2021-05-28 – 2021-05-30 (×3): 8 mg/h via INTRAVENOUS
  Administered 2021-05-31: 4 mg/h via INTRAVENOUS
  Filled 2021-05-23 (×8): qty 20

## 2021-05-23 MED ORDER — SODIUM CHLORIDE 0.9% FLUSH
10.0000 mL | INTRAVENOUS | Status: DC | PRN
  Administered 2021-06-04: 30 mL

## 2021-05-23 MED ORDER — FUROSEMIDE 10 MG/ML IJ SOLN
40.0000 mg | Freq: Once | INTRAMUSCULAR | Status: DC
Start: 2021-05-23 — End: 2021-05-23

## 2021-05-23 MED ORDER — SODIUM CHLORIDE 0.9% FLUSH
10.0000 mL | Freq: Two times a day (BID) | INTRAVENOUS | Status: DC
  Administered 2021-05-23 – 2021-05-29 (×13): 10 mL
  Administered 2021-05-30: 30 mL
  Administered 2021-05-30: 10 mL
  Administered 2021-05-31: 20 mL
  Administered 2021-05-31 – 2021-06-02 (×5): 10 mL
  Administered 2021-06-03: 30 mL
  Administered 2021-06-03: 10 mL
  Administered 2021-06-04: 30 mL
  Administered 2021-06-04 – 2021-06-08 (×8): 10 mL
  Administered 2021-06-09: 30 mL
  Administered 2021-06-09 – 2021-06-12 (×4): 10 mL
  Administered 2021-06-12: 1 mL
  Administered 2021-06-13 – 2021-06-15 (×5): 10 mL

## 2021-05-23 MED ORDER — IOHEXOL 300 MG/ML  SOLN
100.0000 mL | Freq: Once | INTRAMUSCULAR | Status: AC | PRN
  Administered 2021-05-23: 100 mL via INTRAVENOUS

## 2021-05-23 NOTE — Progress Notes (Signed)
PT Cancellation Note ? ?Patient Details ?Name: Cassidy Tran ?MRN: [id number] ?DOB: [birth year] ? ? ?Cancelled Treatment:    Reason Eval/Treat Not Completed: Medical issues which prohibited therapy- Pt. Remains intubated. PT to follow up acutely as able.  ? ?Thermon Leyland, SPT ?Acute Rehab Services ? ? ? ?Thermon Leyland ?05/23/2021, 9:23 AM ?

## 2021-05-23 NOTE — Progress Notes (Signed)
RT x2 attempted to obtain ABG and were unsuccessful. MD made aware. ?

## 2021-05-23 NOTE — Progress Notes (Signed)
RT assisted with transport of patient while on full ventilatory support from 4N19 to CT and back. Patient tolerated well with stable vital signs and no complications. RT will continue to monitor.

## 2021-05-23 NOTE — Progress Notes (Signed)
Peripherally Inserted Central Catheter Placement ? ?The IV Nurse has discussed with the patient and/or persons authorized to consent for the patient, the purpose of this procedure and the potential benefits and risks involved with this procedure.  The benefits include less needle sticks, lab draws from the catheter, and the patient may be discharged home with the catheter. Risks include, but not limited to, infection, bleeding, blood clot (thrombus formation), and puncture of an artery; nerve damage and irregular heartbeat and possibility to perform a PICC exchange if needed/ordered by physician.  Alternatives to this procedure were also discussed.  Bard Power PICC patient education guide, fact sheet on infection prevention and patient information card has been provided to patient /or left at bedside.   ? ?PICC Placement Documentation  ?PICC Triple Lumen 05/23/21 Right Brachial 38 cm 0 cm (Active)  ?Indication for Insertion or Continuance of Line Vasoactive infusions 05/23/21 1122  ?Exposed Catheter (cm) 0 cm 05/23/21 1122  ?Site Assessment Clean, Dry, Intact 05/23/21 1122  ?Lumen #1 Status Flushed;Saline locked;Blood return noted 05/23/21 1122  ?Lumen #2 Status Flushed;Saline locked;Blood return noted 05/23/21 1122  ?Lumen #3 Status Flushed;Saline locked;Blood return noted 05/23/21 1122  ?Dressing Type Transparent;Securing device 05/23/21 1122  ?Dressing Status Antimicrobial disc in place;Clean, Dry, Intact 05/23/21 1122  ?Safety Lock Not Applicable 27/06/23 7628  ?Dressing Intervention New dressing;Other (Comment) 05/23/21 1122  ?Dressing Change Due 05/30/21 05/23/21 1122  ? ? ? ? ? ?Enos Fling ?05/23/2021, 11:24 AM ? ?

## 2021-05-23 NOTE — Progress Notes (Signed)
RT attempted to obtain ABG, per ABG results it is a venous sample. Will re-try in the AM.  ?

## 2021-05-23 NOTE — Progress Notes (Signed)
? ?Trauma/Critical Care Follow Up Note ? ?Subjective:  ?  ?Overnight Issues:  ? ?Objective:  ?Vital signs for last 24 hours: ?Temp:  [99.1 ?F (37.3 ?C)-100.3 ?F (37.9 ?C)] 100.3 ?F (37.9 ?C) (03/27 0800) ?Pulse Rate:  [65-107] 83 (03/27 2536) ?Resp:  [15-43] 27 (03/27 6440) ?BP: (92-220)/(40-108) 128/55 (03/27 3474) ?SpO2:  [74 %-100 %] 100 % (03/27 0822) ?FiO2 (%):  [60 %-100 %] 60 % (03/27 0822) ?Weight:  [87.3 kg] 87.3 kg (03/27 0500) ? ?Hemodynamic parameters for last 24 hours: ?  ? ?Intake/Output from previous day: ?03/26 0701 - 03/27 0700 ?In: 4083.7 [I.V.:1531.6; NG/GT:2052; IV Piggyback:500.1] ?Out: 1700 [Urine:1700]  ?Intake/Output this shift: ?Total I/O ?In: 164.6 [I.V.:59.6; NG/GT:105] ?Out: -  ? ?Vent settings for last 24 hours: ?Vent Mode: PRVC ?FiO2 (%):  [60 %-100 %] 60 % ?Set Rate:  [15 bmp-24 bmp] 24 bmp ?Vt Set:  [340 mL-410 mL] 340 mL ?PEEP:  [10 cmH20-18 cmH20] 16 cmH20 ?Plateau Pressure:  [19 cmH20-39 cmH20] 19 cmH20 ? ?Physical Exam:  ?Gen: comfortable, no distress ?Neuro: non-focal exam ?HEENT: PERRL ?Neck: supple ?CV: RRR ?Pulm: unlabored breathing ?Abd: soft, NT, midline wound with inferior aspect packed ?GU: clear yellow urine, foley ?Extr: wwp, no edema ? ? ?Results for orders placed or performed during the hospital encounter of 05/05/21 (from the past 24 hour(s))  ?Glucose, capillary     Status: Abnormal  ? Collection Time: 05/22/21  3:41 PM  ?Result Value Ref Range  ? Glucose-Capillary 132 (H) 70 - 99 mg/dL  ?Glucose, capillary     Status: Abnormal  ? Collection Time: 05/22/21  7:45 PM  ?Result Value Ref Range  ? Glucose-Capillary 116 (H) 70 - 99 mg/dL  ?I-STAT 7, (LYTES, BLD GAS, ICA, H+H)     Status: Abnormal  ? Collection Time: 05/22/21  8:52 PM  ?Result Value Ref Range  ? pH, Arterial 7.256 (L) 7.35 - 7.45  ? pCO2 arterial 72.7 (HH) 32 - 48 mmHg  ? pO2, Arterial 78 (L) 83 - 108 mmHg  ? Bicarbonate 32.2 (H) 20.0 - 28.0 mmol/L  ? TCO2 34 (H) 22 - 32 mmol/L  ? O2 Saturation 92 %  ?  Acid-Base Excess 4.0 (H) 0.0 - 2.0 mmol/L  ? Sodium 149 (H) 135 - 145 mmol/L  ? Potassium 4.5 3.5 - 5.1 mmol/L  ? Calcium, Ion 1.18 1.15 - 1.40 mmol/L  ? HCT 28.0 (L) 36.0 - 46.0 %  ? Hemoglobin 9.5 (L) 12.0 - 15.0 g/dL  ? Patient temperature 99.1 F   ? Sample type ARTERIAL   ? Comment NOTIFIED PHYSICIAN   ?I-STAT 7, (LYTES, BLD GAS, ICA, H+H)     Status: Abnormal  ? Collection Time: 05/22/21 10:46 PM  ?Result Value Ref Range  ? pH, Arterial 7.401 7.35 - 7.45  ? pCO2 arterial 51.0 (H) 32 - 48 mmHg  ? pO2, Arterial 161 (H) 83 - 108 mmHg  ? Bicarbonate 31.5 (H) 20.0 - 28.0 mmol/L  ? TCO2 33 (H) 22 - 32 mmol/L  ? O2 Saturation 99 %  ? Acid-Base Excess 6.0 (H) 0.0 - 2.0 mmol/L  ? Sodium 148 (H) 135 - 145 mmol/L  ? Potassium 4.2 3.5 - 5.1 mmol/L  ? Calcium, Ion 1.15 1.15 - 1.40 mmol/L  ? HCT 25.0 (L) 36.0 - 46.0 %  ? Hemoglobin 8.5 (L) 12.0 - 15.0 g/dL  ? Patient temperature 99.1 F   ? Sample type ARTERIAL   ?Glucose, capillary     Status: Abnormal  ?  Collection Time: 05/22/21 11:10 PM  ?Result Value Ref Range  ? Glucose-Capillary 139 (H) 70 - 99 mg/dL  ?Basic metabolic panel     Status: Abnormal  ? Collection Time: 05/23/21  3:04 AM  ?Result Value Ref Range  ? Sodium 146 (H) 135 - 145 mmol/L  ? Potassium 4.1 3.5 - 5.1 mmol/L  ? Chloride 111 98 - 111 mmol/L  ? CO2 29 22 - 32 mmol/L  ? Glucose, Bld 121 (H) 70 - 99 mg/dL  ? BUN 14 6 - 20 mg/dL  ? Creatinine, Ser 0.68 0.44 - 1.00 mg/dL  ? Calcium 8.1 (L) 8.9 - 10.3 mg/dL  ? GFR, Estimated >60 >60 mL/min  ? Anion gap 6 5 - 15  ?Glucose, capillary     Status: Abnormal  ? Collection Time: 05/23/21  3:22 AM  ?Result Value Ref Range  ? Glucose-Capillary 138 (H) 70 - 99 mg/dL  ?Glucose, capillary     Status: Abnormal  ? Collection Time: 05/23/21  7:22 AM  ?Result Value Ref Range  ? Glucose-Capillary 151 (H) 70 - 99 mg/dL  ? ? ?Assessment & Plan: ?The plan of care was discussed with the bedside nurse for the day, Roselyn Reef, who is in agreement with this plan and no additional concerns  were raised.  ? ?Present on Admission: ? Neuroendocrine carcinoma ([place]) ? ? ? LOS: 18 days  ? ?Additional comments:I reviewed the patient's new clinical lab test results.   and I reviewed the patients new imaging test results.   ? ?Neuroendocrine tumor on colonoscopy - s/p exlap, R hemicolectomy, cholecystectomy 3/9. Well differentiated neuroendocrine tumor with 1/7 +nodes. D/w Dr. Marin Olp 3/15 will likely need adjuvant chemo. Staples out 3/24. Bottom 2cm of wound packed. ?Respiratory failure 2/2 volume overload/?PNA - CT chest 3/11 and 3/13 with no PE. Intubated 3/16, suspect PNA based on CXR but negative resp cx and BAL, empiric merrem started 3/21 continue x5d. Decompensation 3/26, likely will need trach when settings lower, d/w husband. ?Volume overload - previously resolved, but 3/24 PM, went into flash pulmonary edema. Lasix gtt at low dose today ?Shock - hypotension likely due to medication effect, cont levo to allow for diuresis. PICC today ?Altered mental status - CT head 3/13, 3/16/ 3/17 normal (h/o VP shunt). Changed cefepime to zosyn 3/13, total 7d course-end today, empiric coverage for PNA. Resp cx today. Ucx 3/13 negative. Bcx 3/15 NGTD. LP 3/16 NGTD. CT A/P 3/16 negative. 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 Neurology on board, changed to keppra, cont EEG without sz, encephalopathy much improved, Diarrhea - d/c'd imodium to encourage NH4 loss, restarted 3/25, cholestyramine added yest, BMs decreased and thicker ?vWD - humate P per Heme, end 3/12, stable ?Transaminitis - likely 2/2 TPN, now d/c'd, recheck 3/27 ?Leukocytosis - worsening despite merrem, CT CAP today ?FEN - NPO, OGT, tol TF at goal ?Hypernatremia - improving, med review done by RPh, decr [facility] now that lasix added  ?DVT - SCDs, s/p DDVAP, LMWH, LE duplex neg for DVT 3/13 ?Foley - replaced 3/18, d/c'd 3/24, replaced 3/25 ?Dispo - ICU   ? ?Critical Care Total Time: 45 minutes ? ?[name], MD ?Trauma & General  Surgery ?Please use [email or web address] to contact on call provider ? ?05/23/2021 ? ?*Care during the described time interval was provided by me. I have reviewed this patient's available data, including medical history, events of note, physical examination and test results as part of my evaluation. ? ? ? ?

## 2021-05-24 ENCOUNTER — Inpatient Hospital Stay (HOSPITAL_COMMUNITY): Payer: No Typology Code available for payment source

## 2021-05-24 DIAGNOSIS — I272 Pulmonary hypertension, unspecified: Secondary | ICD-10-CM | POA: Diagnosis not present

## 2021-05-24 LAB — ECHOCARDIOGRAM COMPLETE
Area-P 1/2: 3.37 cm2
Height: 63 in
MV VTI: 1.44 cm2
S' Lateral: 3.1 cm
Weight: 3079.39 oz

## 2021-05-24 LAB — POCT I-STAT 7, (LYTES, BLD GAS, ICA,H+H)
Acid-Base Excess: 7 mmol/L — ABNORMAL HIGH (ref 0.0–2.0)
Acid-Base Excess: 6 mmol/L — ABNORMAL HIGH (ref 0.0–2.0)
Bicarbonate: 32.2 mmol/L — ABNORMAL HIGH (ref 20.0–28.0)
Bicarbonate: 32.4 mmol/L — ABNORMAL HIGH (ref 20.0–28.0)
Calcium, Ion: 1.16 mmol/L (ref 1.15–1.40)
Calcium, Ion: 1.14 mmol/L — ABNORMAL LOW (ref 1.15–1.40)
HCT: 21 % — ABNORMAL LOW (ref 36.0–46.0)
HCT: 30 % — ABNORMAL LOW (ref 36.0–46.0)
Hemoglobin: 7.1 g/dL — ABNORMAL LOW (ref 12.0–15.0)
Hemoglobin: 10.2 g/dL — ABNORMAL LOW (ref 12.0–15.0)
O2 Saturation: 90 %
O2 Saturation: 95 %
Patient temperature: 98.6
Patient temperature: 97.5
Potassium: 3.7 mmol/L (ref 3.5–5.1)
Potassium: 4 mmol/L (ref 3.5–5.1)
Sodium: 142 mmol/L (ref 135–145)
Sodium: 141 mmol/L (ref 135–145)
TCO2: 34 mmol/L — ABNORMAL HIGH (ref 22–32)
TCO2: 34 mmol/L — ABNORMAL HIGH (ref 22–32)
pCO2 arterial: 49.2 mmHg — ABNORMAL HIGH (ref 32–48)
pCO2 arterial: 51.3 mmHg — ABNORMAL HIGH (ref 32–48)
pH, Arterial: 7.425 (ref 7.35–7.45)
pH, Arterial: 7.405 (ref 7.35–7.45)
pO2, Arterial: 58 mmHg — ABNORMAL LOW (ref 83–108)
pO2, Arterial: 73 mmHg — ABNORMAL LOW (ref 83–108)

## 2021-05-24 LAB — HEPATIC FUNCTION PANEL
ALT: 86 U/L — ABNORMAL HIGH (ref 0–44)
AST: 32 U/L (ref 15–41)
Albumin: 1.8 g/dL — ABNORMAL LOW (ref 3.5–5.0)
Alkaline Phosphatase: 274 U/L — ABNORMAL HIGH (ref 38–126)
Bilirubin, Direct: 0.2 mg/dL (ref 0.0–0.2)
Indirect Bilirubin: 0.4 mg/dL (ref 0.3–0.9)
Total Bilirubin: 0.6 mg/dL (ref 0.3–1.2)
Total Protein: 6.6 g/dL (ref 6.5–8.1)

## 2021-05-24 LAB — BASIC METABOLIC PANEL
Anion gap: 5 (ref 5–15)
BUN: 7 mg/dL (ref 6–20)
CO2: 29 mmol/L (ref 22–32)
Calcium: 7.2 mg/dL — ABNORMAL LOW (ref 8.9–10.3)
Chloride: 107 mmol/L (ref 98–111)
Creatinine, Ser: 0.5 mg/dL (ref 0.44–1.00)
GFR, Estimated: >60 mL/min (ref >60)
Glucose, Bld: 193 mg/dL — ABNORMAL HIGH (ref 70–99)
Potassium: 3.5 mmol/L (ref 3.5–5.1)
Sodium: 141 mmol/L (ref 135–145)

## 2021-05-24 LAB — CBC
HCT: 21.5 % — ABNORMAL LOW (ref 36.0–46.0)
Hemoglobin: 6.5 g/dL — CL (ref 12.0–15.0)
MCH: 28.4 pg (ref 26.0–34.0)
MCHC: 30.2 g/dL (ref 30.0–36.0)
MCV: 93.9 fL (ref 80.0–100.0)
Platelets: 655 10*3/uL — ABNORMAL HIGH (ref 150–400)
RBC: 2.29 MIL/uL — ABNORMAL LOW (ref 3.87–5.11)
RDW: 14.9 % (ref 11.5–15.5)
WBC: 21.6 10*3/uL — ABNORMAL HIGH (ref 4.0–10.5)
nRBC: 0.1 % (ref 0.0–0.2)

## 2021-05-24 LAB — GLUCOSE, CAPILLARY
Glucose-Capillary: 115 mg/dL — ABNORMAL HIGH (ref 70–99)
Glucose-Capillary: 125 mg/dL — ABNORMAL HIGH (ref 70–99)
Glucose-Capillary: 123 mg/dL — ABNORMAL HIGH (ref 70–99)
Glucose-Capillary: 150 mg/dL — ABNORMAL HIGH (ref 70–99)
Glucose-Capillary: 141 mg/dL — ABNORMAL HIGH (ref 70–99)
Glucose-Capillary: 139 mg/dL — ABNORMAL HIGH (ref 70–99)

## 2021-05-24 LAB — HEMOGLOBIN AND HEMATOCRIT, BLOOD
HCT: 26 % — ABNORMAL LOW (ref 36.0–46.0)
Hemoglobin: 8.4 g/dL — ABNORMAL LOW (ref 12.0–15.0)

## 2021-05-24 LAB — PREPARE RBC (CROSSMATCH)

## 2021-05-24 MED ORDER — FLUCONAZOLE IN SODIUM CHLORIDE 400-0.9 MG/200ML-% IV SOLN: 400.0000 mg | INTRAVENOUS | Status: DC

## 2021-05-24 MED ORDER — FREE WATER
150.0000 mL | Freq: Three times a day (TID) | Status: DC
  Administered 2021-05-24 – 2021-05-25 (×3): 150 mL

## 2021-05-24 MED ORDER — SODIUM CHLORIDE 0.9% IV SOLUTION: Freq: Once | INTRAVENOUS | Status: AC

## 2021-05-24 MED ORDER — ALBUTEROL SULFATE (2.5 MG/3ML) 0.083% IN NEBU
2.5000 mg | INHALATION_SOLUTION | Freq: Three times a day (TID) | RESPIRATORY_TRACT | Status: DC
  Administered 2021-05-25 (×3): 2.5 mg via RESPIRATORY_TRACT
  Filled 2021-05-24 (×3): qty 3

## 2021-05-24 NOTE — Evaluation (Signed)
Physical Therapy Evaluation and Discharge ?Patient Details ?Name: Cassidy Tran ?MRN: [id number] ?DOB: [birth year] ?Today's Date: 05/24/2021 ? ?History of Present Illness ? 46 y.o. female presents to [facility] hospital on 05/05/2021 and underwent ex-lap with cholecystectomy and R hemicolectomy for neuroendocrine tumor. Pt developed respiratory failure due to volume overload and required intubation on 3/16. Pt developed AMS during admission with rising white cell counts, underwent lumbar punture on 3/16. Pt underwent bronchoscopy on 3/21. PMH includes ASCUS, clotting disorder, seizures, PNA, Von Willebrand disease, VP shunt placement.  ?Clinical Impression ? Patient evaluated by Physical Therapy with no further acute PT needs identified. All education has been completed and the patient has no further questions. Pt's family educated on PROM, positioning, stimulation, and signs of pain as MD ordered. Recommend PRAFO's. Signing off for now until PT reordered for increasing mobility.  See below for any follow-up Physical Therapy or equipment needs. PT is signing off. Thank you for this referral. ?   ?   ? ?Recommendations for follow up therapy are one component of a multi-disciplinary discharge planning process, led by the attending physician.  Recommendations may be updated based on patient status, additional functional criteria and insurance authorization. ? ?Follow Up Recommendations Other (comment) (TBD later) ? ?  ?Assistance Recommended at Discharge Frequent or constant Supervision/Assistance  ?Patient can return home with the following ? Other (comment) (N/A yet) ? ?  ?Equipment Recommendations Other (comment) (TBD)  ?Recommendations for Other Services ?    ?  ?Functional Status Assessment Patient has had a recent decline in their functional status and demonstrates the ability to make significant improvements in function in a reasonable and predictable amount of time.  ? ?  ?Precautions / Restrictions  Precautions ?Precautions: Other (comment) ?Precaution Comments: intubated ?Restrictions ?Weight Bearing Restrictions: No  ? ?  ? ?Mobility ? Bed Mobility ?  ?  ?  ?  ?  ?  ?  ?  ?  ? ?Transfers ?  ?  ?  ?  ?  ?  ?  ?  ?  ?  ?  ? ?Ambulation/Gait ?  ?  ?  ?  ?  ?  ?  ?  ? ?Stairs ?  ?  ?  ?  ?  ? ?Wheelchair Mobility ?  ? ?Modified Rankin (Stroke Patients Only) ?  ? ?  ? ?Balance   ?  ?  ?  ?  ?  ?  ?  ?  ?  ?  ?  ?  ?  ?  ?  ?  ?  ?  ?   ? ? ? ?Pertinent Vitals/Pain Pain Assessment ?Pain Assessment: CPOT ?Facial Expression: Relaxed, neutral ?Body Movements: Absence of movements ?Muscle Tension: Relaxed ?Compliance with ventilator (intubated pts.): N/A  ? ? ?Home Living Family/patient expects to be discharged to:: Private residence ?Living Arrangements: Spouse/significant other ?Available Help at Discharge: Family;Available PRN/intermittently ?Type of Home: House ?  ?  ?  ?  ?  ?Home Equipment: None ?Additional Comments: pt lives with husband, parents nearby. Pt works in [employer] and loves her job. Has a dog named Yogi.  ?  ?Prior Function Prior Level of Function : Independent/Modified Independent;Working/employed ?  ?  ?  ?  ?  ?  ?  ?  ?  ? ? ?Hand Dominance  ?   ? ?  ?Extremity/Trunk Assessment  ?   ?  ? ?  ?  ? ?   ?Communication  ? Communication: Other (comment) (  intubated)  ?Cognition Arousal/Alertness: Lethargic, Suspect due to medications (paralytics) ?  ?Overall Cognitive Status: Impaired/Different from baseline ?  ?  ?  ?  ?  ?  ?  ?  ?  ?  ?  ?  ?  ?  ?  ?  ?  ?  ?  ? ?  ?General Comments General comments (skin integrity, edema, etc.): PT ordered for ROM/ family education. Husband and parents present. Reviewed PROM of each joint with family and family demoed back. Discussed stimulation, positioning, and signs of pain. ? ?  ?Exercises    ? ?Assessment/Plan  ?  ?PT Assessment Patient does not need any further PT services  ?PT Problem List   ? ?   ?  ?PT Treatment Interventions     ? ?PT Goals (Current  goals can be found in the Care Plan section)  ?Acute Rehab PT Goals ?Patient Stated Goal: family hoping for pt to return to PLOF ?PT Goal Formulation: With family ? ?  ?Frequency   ?  ? ? ?Co-evaluation   ?  ?  ?  ?  ? ? ?  ?AM-PAC PT "6 Clicks" Mobility  ?Outcome Measure Help needed turning from your back to your side while in a flat bed without using bedrails?: Total ?Help needed moving from lying on your back to sitting on the side of a flat bed without using bedrails?: Total ?Help needed moving to and from a bed to a chair (including a wheelchair)?: Total ?Help needed standing up from a chair using your arms (e.g., wheelchair or bedside chair)?: Total ?Help needed to walk in hospital room?: Total ?Help needed climbing 3-5 steps with a railing? : Total ?6 Click Score: 6 ? ?  ?End of Session   ?Activity Tolerance: Patient tolerated treatment well ?Patient left: in bed;with call bell/phone within reach;with family/visitor present ?Nurse Communication: Other (comment) (RN presesnt for session) ?  ?  ? ?Time: 7322-0254 ?PT Time Calculation (min) (ACUTE ONLY): 25 min ? ? ?Charges:   PT Evaluation ?$PT Eval Low Complexity: 1 Low ?PT Treatments ?$Therapeutic Exercise: 8-22 mins ?  ?   ? ? ?Leighton Roach, PT  ?Acute Rehab Services ? Pager [phone] ?Office [phone] ? ? ?[place] ?05/24/2021, 11:06 AM ? ?

## 2021-05-24 NOTE — Progress Notes (Signed)
?  Echocardiogram ?2D Echocardiogram has been performed. ? ?Cassidy Tran ?05/24/2021, 5:05 PM ?

## 2021-05-24 NOTE — Progress Notes (Signed)
? ?Trauma/Critical Care Follow Up Note ? ?Subjective:  ?  ?Overnight Issues:  ? ?Objective:  ?Vital signs for last 24 hours: ?Temp:  [98.2 ?F (36.8 ?C)-100.3 ?F (37.9 ?C)] 99.6 ?F (37.6 ?C) (03/28 0400) ?Pulse Rate:  [70-88] 73 (03/28 0804) ?Resp:  [20-43] 37 (03/28 0804) ?BP: (94-133)/(48-63) 124/58 (03/28 0800) ?SpO2:  [90 %-100 %] 90 % (03/28 0804) ?FiO2 (%):  [60 %-70 %] 60 % (03/28 0804) ?Weight:  [87.3 kg] 87.3 kg (03/28 0423) ? ?Hemodynamic parameters for last 24 hours: ?  ? ?Intake/Output from previous day: ?03/27 0701 - 03/28 0700 ?In: 4546.2 [I.V.:1652; NG/GT:2434.1; IV Piggyback:460.1] ?Out: 2950 [Urine:2550; Stool:400]  ?Intake/Output this shift: ?Total I/O ?In: 121.4 [I.V.:66.4; NG/GT:55] ?Out: 225 [Urine:225] ? ?Vent settings for last 24 hours: ?Vent Mode: PRVC ?FiO2 (%):  [60 %-70 %] 60 % ?Set Rate:  [24 bmp] 24 bmp ?Vt Set:  [340 mL] 340 mL ?PEEP:  [8 cmH20-18 cmH20] 16 cmH20 ?Plateau Pressure:  [19 cmH20-27 cmH20] 23 cmH20 ? ?Physical Exam:  ?Gen: comfortable, no distress ?Neuro: heavily sedated ?HEENT: PERRL ?Neck: supple ?CV: RRR ?Pulm: unlabored breathing ?Abd: soft, NT ?GU: clear yellow urine ?Extr: wwp, no edema ? ? ?Results for orders placed or performed during the hospital encounter of 05/05/21 (from the past 24 hour(s))  ?CBC     Status: Abnormal  ? Collection Time: 05/23/21 11:29 AM  ?Result Value Ref Range  ? WBC 27.5 (H) 4.0 - 10.5 K/uL  ? RBC 2.90 (L) 3.87 - 5.11 MIL/uL  ? Hemoglobin 8.3 (L) 12.0 - 15.0 g/dL  ? HCT 26.9 (L) 36.0 - 46.0 %  ? MCV 92.8 80.0 - 100.0 fL  ? MCH 28.6 26.0 - 34.0 pg  ? MCHC 30.9 30.0 - 36.0 g/dL  ? RDW 15.0 11.5 - 15.5 %  ? Platelets 892 (H) 150 - 400 K/uL  ? nRBC 0.2 0.0 - 0.2 %  ?Glucose, capillary     Status: Abnormal  ? Collection Time: 05/23/21 11:51 AM  ?Result Value Ref Range  ? Glucose-Capillary 149 (H) 70 - 99 mg/dL  ?Glucose, capillary     Status: Abnormal  ? Collection Time: 05/23/21  3:31 PM  ?Result Value Ref Range  ? Glucose-Capillary 150 (H) 70 -  99 mg/dL  ?Glucose, capillary     Status: Abnormal  ? Collection Time: 05/23/21  7:29 PM  ?Result Value Ref Range  ? Glucose-Capillary 121 (H) 70 - 99 mg/dL  ?I-STAT 7, (LYTES, BLD GAS, ICA, H+H)     Status: Abnormal  ? Collection Time: 05/23/21  9:15 PM  ?Result Value Ref Range  ? pH, Arterial 7.407 7.35 - 7.45  ? pCO2 arterial 45.4 32 - 48 mmHg  ? pO2, Arterial 32 (LL) 83 - 108 mmHg  ? Bicarbonate 28.5 (H) 20.0 - 28.0 mmol/L  ? TCO2 30 22 - 32 mmol/L  ? O2 Saturation 61 %  ? Acid-Base Excess 3.0 (H) 0.0 - 2.0 mmol/L  ? Sodium 143 135 - 145 mmol/L  ? Potassium 3.9 3.5 - 5.1 mmol/L  ? Calcium, Ion 1.15 1.15 - 1.40 mmol/L  ? HCT 23.0 (L) 36.0 - 46.0 %  ? Hemoglobin 7.8 (L) 12.0 - 15.0 g/dL  ? Patient temperature 99.1 F   ? Collection site RADIAL, ALLEN'S TEST ACCEPTABLE   ? Drawn by RT   ? Sample type ARTERIAL   ? Comment NOTIFIED PHYSICIAN   ?Glucose, capillary     Status: Abnormal  ? Collection Time: 05/23/21 11:19 PM  ?  Result Value Ref Range  ? Glucose-Capillary 137 (H) 70 - 99 mg/dL  ?I-STAT 7, (LYTES, BLD GAS, ICA, H+H)     Status: Abnormal  ? Collection Time: 05/24/21  3:11 AM  ?Result Value Ref Range  ? pH, Arterial 7.425 7.35 - 7.45  ? pCO2 arterial 49.2 (H) 32 - 48 mmHg  ? pO2, Arterial 58 (L) 83 - 108 mmHg  ? Bicarbonate 32.2 (H) 20.0 - 28.0 mmol/L  ? TCO2 34 (H) 22 - 32 mmol/L  ? O2 Saturation 90 %  ? Acid-Base Excess 7.0 (H) 0.0 - 2.0 mmol/L  ? Sodium 142 135 - 145 mmol/L  ? Potassium 3.7 3.5 - 5.1 mmol/L  ? Calcium, Ion 1.16 1.15 - 1.40 mmol/L  ? HCT 21.0 (L) 36.0 - 46.0 %  ? Hemoglobin 7.1 (L) 12.0 - 15.0 g/dL  ? Patient temperature 98.6 F   ? Collection site art line   ? Drawn by Operator   ? Sample type ARTERIAL   ?Glucose, capillary     Status: Abnormal  ? Collection Time: 05/24/21  3:40 AM  ?Result Value Ref Range  ? Glucose-Capillary 115 (H) 70 - 99 mg/dL  ?Basic metabolic panel     Status: Abnormal  ? Collection Time: 05/24/21  5:58 AM  ?Result Value Ref Range  ? Sodium 141 135 - 145 mmol/L  ?  Potassium 3.5 3.5 - 5.1 mmol/L  ? Chloride 107 98 - 111 mmol/L  ? CO2 29 22 - 32 mmol/L  ? Glucose, Bld 193 (H) 70 - 99 mg/dL  ? BUN 7 6 - 20 mg/dL  ? Creatinine, Ser 0.50 0.44 - 1.00 mg/dL  ? Calcium 7.2 (L) 8.9 - 10.3 mg/dL  ? GFR, Estimated >60 >60 mL/min  ? Anion gap 5 5 - 15  ?CBC     Status: Abnormal  ? Collection Time: 05/24/21  5:58 AM  ?Result Value Ref Range  ? WBC 21.6 (H) 4.0 - 10.5 K/uL  ? RBC 2.29 (L) 3.87 - 5.11 MIL/uL  ? Hemoglobin 6.5 (LL) 12.0 - 15.0 g/dL  ? HCT 21.5 (L) 36.0 - 46.0 %  ? MCV 93.9 80.0 - 100.0 fL  ? MCH 28.4 26.0 - 34.0 pg  ? MCHC 30.2 30.0 - 36.0 g/dL  ? RDW 14.9 11.5 - 15.5 %  ? Platelets 655 (H) 150 - 400 K/uL  ? nRBC 0.1 0.0 - 0.2 %  ?Type and screen [place]     Status: None (Preliminary result)  ? Collection Time: 05/24/21  7:01 AM  ?Result Value Ref Range  ? ABO/RH(D) O POS   ? Antibody Screen NEG   ? Sample Expiration    ?  05/27/2021,2359 ?Performed at [facility], [place] [address], [place], [place] [address] ?  ? Unit Number [id number]   ? Blood Component Type RED CELLS,LR   ? Unit division 00   ? Status of Unit ALLOCATED   ? Transfusion Status OK TO TRANSFUSE   ? Crossmatch Result Compatible   ?Prepare RBC (crossmatch)     Status: None  ? Collection Time: 05/24/21  7:01 AM  ?Result Value Ref Range  ? Order Confirmation    ?  ORDER PROCESSED BY BLOOD BANK ?Performed at [facility], [place] [address] [place], [place] [address] ?  ?Glucose, capillary     Status: Abnormal  ? Collection Time: 05/24/21  7:42 AM  ?Result Value Ref Range  ? Glucose-Capillary 125 (H) 70 -  99 mg/dL  ? ? ?Assessment & Plan: ?The plan of care was discussed with the bedside nurse for the day, Judson Roch, who is in agreement with this plan and no additional concerns were raised.  ? ?Present on Admission: ? Neuroendocrine carcinoma ([place]) ? ? ? LOS: 19 days  ? ?Additional comments:I reviewed the patient's new clinical lab test results.   and I reviewed the patients new imaging  test results.   ? ?Neuroendocrine tumor on colonoscopy - s/p exlap, R hemicolectomy, cholecystectomy 3/9. Well differentiated neuroendocrine tumor with 1/7 +nodes. D/w Dr. Marin Olp 3/15 will likely need adjuvant chemo. Staples out 3/24. Bottom 2cm of wound packed. ?Respiratory failure 2/2 volume overload/?PNA - CT chest 3/11 and 3/13 with no PE. Intubated 3/16, suspect PNA based on CXR but negative resp cx and BAL, empiric merrem started 3/21 continue x5d. Decompensation 3/26, likely will need trach when settings lower, d/w husband. ?Volume overload - previously resolved, but 3/24 PM, went into flash pulmonary edema. Cont lasix gtt ?Shock - hypotension likely due to medication effect, cont levo to allow for diuresis. PICC 3/27 ?Altered mental status - CT head 3/13, 3/16/ 3/17 normal (h/o VP shunt). Changed cefepime to zosyn 3/13, total 7d course-end today, empiric coverage for PNA. Resp cx today. Ucx 3/13 negative. Bcx 3/15 NGTD. LP 3/16 NGTD. CT A/P 3/16 negative. 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 Neurology on board, changed to keppra, cont EEG without sz, encephalopathy much improved  ?Diarrhea - d/c'd imodium to encourage NH4 loss, restarted 3/25, cholestyramine added yest, BMs decreased and thicker. ?vWD - humate P per Heme, end 3/12, stable ?Transaminitis - likely 2/2 TPN, now d/c'd, recheck 3/27 ?Leukocytosis - remains in low 20s despite merrem, CT CAP 3/27 unremarkable ?FEN - NPO, OGT, tol TF at goal ?Hypernatremia - improving, med review done by RPh, decr [facility]WF now that lasix added and hyponatremia improved ?ABLA - 1u pRBC  ?DVT - SCDs, s/p DDVAP, LMWH, LE duplex neg for DVT 3/13 ?Foley - replaced 3/18, d/c'd 3/24, replaced 3/25, cont for accurate I/O on lasix gtt ?Dispo - ICU   ?  ? ?Critical Care Total Time: 35 minutes ? ?[name], MD ?Trauma & General Surgery ?Please use [email or web address] to contact on call provider ? ?05/24/2021 ? ?*Care during the described time interval was  provided by me. I have reviewed this patient's available data, including medical history, events of note, physical examination and test results as part of my evaluation. ? ? ? ?

## 2021-05-24 NOTE — Progress Notes (Signed)
Date and time results received: 05/24/21 0629 ? ?Test: Hemoglobin ?Critical Value: 6.5 ? ?Name of Provider Notified: Trauma MD Redmond Pulling ? ?Orders Received? Or Actions Taken?: Awaiting orders  ? ?Wyn Quaker, RN ? ?

## 2021-05-25 LAB — GLUCOSE, CAPILLARY
Glucose-Capillary: 126 mg/dL — ABNORMAL HIGH (ref 70–99)
Glucose-Capillary: 145 mg/dL — ABNORMAL HIGH (ref 70–99)
Glucose-Capillary: 158 mg/dL — ABNORMAL HIGH (ref 70–99)
Glucose-Capillary: 158 mg/dL — ABNORMAL HIGH (ref 70–99)
Glucose-Capillary: 161 mg/dL — ABNORMAL HIGH (ref 70–99)
Glucose-Capillary: 165 mg/dL — ABNORMAL HIGH (ref 70–99)

## 2021-05-25 LAB — BASIC METABOLIC PANEL
Anion gap: 9 (ref 5–15)
BUN: 11 mg/dL (ref 6–20)
CO2: 31 mmol/L (ref 22–32)
Calcium: 8.2 mg/dL — ABNORMAL LOW (ref 8.9–10.3)
Chloride: 98 mmol/L (ref 98–111)
Creatinine, Ser: 0.56 mg/dL (ref 0.44–1.00)
GFR, Estimated: >60 mL/min (ref >60)
Glucose, Bld: 148 mg/dL — ABNORMAL HIGH (ref 70–99)
Potassium: 4.2 mmol/L (ref 3.5–5.1)
Sodium: 138 mmol/L (ref 135–145)

## 2021-05-25 LAB — CBC
HCT: 27 % — ABNORMAL LOW (ref 36.0–46.0)
Hemoglobin: 8.7 g/dL — ABNORMAL LOW (ref 12.0–15.0)
MCH: 29.6 pg (ref 26.0–34.0)
MCHC: 32.2 g/dL (ref 30.0–36.0)
MCV: 91.8 fL (ref 80.0–100.0)
Platelets: 614 10*3/uL — ABNORMAL HIGH (ref 150–400)
RBC: 2.94 MIL/uL — ABNORMAL LOW (ref 3.87–5.11)
RDW: 14.8 % (ref 11.5–15.5)
WBC: 20.7 10*3/uL — ABNORMAL HIGH (ref 4.0–10.5)
nRBC: 0 % (ref 0.0–0.2)

## 2021-05-25 LAB — TYPE AND SCREEN
ABO/RH(D): O POS
Antibody Screen: NEGATIVE
Unit division: 0

## 2021-05-25 LAB — BPAM RBC
Blood Product Expiration Date: 202304262359
ISSUE DATE / TIME: 202303280840
Unit Type and Rh: 5100

## 2021-05-25 NOTE — Progress Notes (Signed)
Nutrition Follow-up ? ?DOCUMENTATION CODES:  ? ?Obesity unspecified ? ?INTERVENTION:  ? ?Tube feeding via OG tube (post pyloric): ?Vital 1.5 at 55 ml/h (1320 ml per day) ?Prosource TF 45 ml BID ? ?Provides 2060 kcal, 111 gm protein, 1008 ml free water daily ? ?200 ml free water every 6 hours ?Total free water: 1808 ml  ? ?NUTRITION DIAGNOSIS:  ? ?Increased nutrient needs related to acute illness as evidenced by estimated needs. ?Ongoing.  ? ?GOAL:  ? ?Patient will meet greater than or equal to 90% of their needs ?Met with TF at goal.  ? ?MONITOR:  ? ?TF tolerance ? ?REASON FOR ASSESSMENT:  ? ?Other (Comment) (Cortrak) ?  ? ?ASSESSMENT:  ? 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 PMH significant for type IA von Willebrand disease. ? ?Pt discussed during ICU rounds and with RN. Per trauma notes pt will likely need adjuvant chemo. Imodium d/c'ed to allow NH4 loss has now resumed.  ?Lasix increased  ?Spoke with family at bedside.  ? ?3/9 s/p ex lap, R hemicolectomy, cholecystectomy for well differentiated neuroendocrine turmor with 1/7 + nodes.  ?3/15 s/p cortrak placement; tip gastric; started TF -- cortrak removed later that night ?3/16 intubated; OG tube placed  ?3/17 developed emesis; TF held ?3/18 TPN initiated ?3/20 TF resumed and advanced to goal ? ?Remains on vent support  ? ?Medications reviewed and include: Questran, SSI, imodium, protonix ?Lasix: 6 mg/hr  ? ?Labs reviewed:  ?TG: 272 (3/21) ?NH4: 66 (3/20) ?CBG's: 126-161 ? ?UOP: 2700 ml  ? ?OG tube: tip in descending duodenum  ? ?Diet Order:   ?Diet Order   ? ?       ?  Diet NPO time specified  Diet effective now       ?  ? ?  ?  ? ?  ? ? ?EDUCATION NEEDS:  ? ?Education needs have been addressed ? ?Skin:  Skin Assessment: Skin Integrity Issues: ?Skin Integrity Issues:: Other (Comment), Incisions ?Incisions: abdomen (closed) ?Other: MASD anus ? ?Last BM:  400 ml via rectal tube ? ?Height:  ? ?Ht Readings from Last 1 Encounters:   ?05/12/21 _0  (1.6 m)  ? ? ?Weight:  ? ?Wt Readings from Last 1 Encounters:  ?05/24/21 87.3 kg  ? ? ?Ideal Body Weight:  52.3 kg ? ?BMI:  Body mass index is 34.09 kg/m?. ? ?Estimated Nutritional Needs:  ? ?Kcal:  2000-2200 ? ?Protein:  100-115g ? ?Fluid:  >/=2L ? ?Lockie Pares., RD, LDN, CNSC ?See AMiON for contact information  ? ?

## 2021-05-25 NOTE — Progress Notes (Signed)
? ?General Surgery Follow Up Note ? ?Subjective:  ?  ?Overnight Issues:  ? ?Objective:  ?Vital signs for last 24 hours: ?Temp:  [97.5 ?F (36.4 ?C)-101.1 ?F (38.4 ?C)] 100.1 ?F (37.8 ?C) (03/29 0400) ?Pulse Rate:  [73-89] 84 (03/29 0700) ?Resp:  [19-37] 23 (03/29 0700) ?BP: (97-138)/(44-64) 114/53 (03/29 0740) ?SpO2:  [88 %-100 %] 95 % (03/29 0700) ?Arterial Line BP: (120-141)/(53-60) 121/54 (03/29 0700) ?FiO2 (%):  [60 %-70 %] 70 % (03/29 0740) ? ?Hemodynamic parameters for last 24 hours: ?  ? ?Intake/Output from previous day: ?03/28 0701 - 03/29 0700 ?In: 3268 [I.V.:1433; Blood:315; NG/GT:1520] ?Out: 2700 [Urine:2700]  ?Intake/Output this shift: ?Total I/O ?In: 52.3 [NG/GT:52.3] ?Out: 100 [Urine:100] ? ?Vent settings for last 24 hours: ?Vent Mode: PRVC ?FiO2 (%):  [60 %-70 %] 70 % ?Set Rate:  [24 bmp] 24 bmp ?Vt Set:  [340 mL] 340 mL ?PEEP:  [16 cmH20] 16 cmH20 ?Plateau Pressure:  [23 cmH20-43 cmH20] 30 cmH20 ? ?Physical Exam:  ?Gen: comfortable, no distress ?Neuro: non-focal exam ?HEENT: PERRL ?Neck: supple ?CV: RRR ?Pulm: unlabored breathing ?Abd: soft, NT, incision c/d/I, inferior aspect of wound packed ?GU: clear yellow urine ?Extr: wwp, no edema ? ? ?Results for orders placed or performed during the hospital encounter of 05/05/21 (from the past 24 hour(s))  ?I-STAT 7, (LYTES, BLD GAS, ICA, H+H)     Status: Abnormal  ? Collection Time: 05/24/21 11:30 AM  ?Result Value Ref Range  ? pH, Arterial 7.405 7.35 - 7.45  ? pCO2 arterial 51.3 (H) 32 - 48 mmHg  ? pO2, Arterial 73 (L) 83 - 108 mmHg  ? Bicarbonate 32.4 (H) 20.0 - 28.0 mmol/L  ? TCO2 34 (H) 22 - 32 mmol/L  ? O2 Saturation 95 %  ? Acid-Base Excess 6.0 (H) 0.0 - 2.0 mmol/L  ? Sodium 141 135 - 145 mmol/L  ? Potassium 4.0 3.5 - 5.1 mmol/L  ? Calcium, Ion 1.14 (L) 1.15 - 1.40 mmol/L  ? HCT 30.0 (L) 36.0 - 46.0 %  ? Hemoglobin 10.2 (L) 12.0 - 15.0 g/dL  ? Patient temperature 97.5 F   ? Collection site RADIAL, ALLEN'S TEST ACCEPTABLE   ? Drawn by RT   ? Sample  type ARTERIAL   ?Glucose, capillary     Status: Abnormal  ? Collection Time: 05/24/21 11:34 AM  ?Result Value Ref Range  ? Glucose-Capillary 123 (H) 70 - 99 mg/dL  ?Hemoglobin and hematocrit, blood     Status: Abnormal  ? Collection Time: 05/24/21  1:37 PM  ?Result Value Ref Range  ? Hemoglobin 8.4 (L) 12.0 - 15.0 g/dL  ? HCT 26.0 (L) 36.0 - 46.0 %  ?Glucose, capillary     Status: Abnormal  ? Collection Time: 05/24/21  3:46 PM  ?Result Value Ref Range  ? Glucose-Capillary 150 (H) 70 - 99 mg/dL  ?Glucose, capillary     Status: Abnormal  ? Collection Time: 05/24/21  7:36 PM  ?Result Value Ref Range  ? Glucose-Capillary 141 (H) 70 - 99 mg/dL  ?Glucose, capillary     Status: Abnormal  ? Collection Time: 05/24/21 11:24 PM  ?Result Value Ref Range  ? Glucose-Capillary 139 (H) 70 - 99 mg/dL  ?Glucose, capillary     Status: Abnormal  ? Collection Time: 05/25/21  3:30 AM  ?Result Value Ref Range  ? Glucose-Capillary 126 (H) 70 - 99 mg/dL  ?Basic metabolic panel     Status: Abnormal  ? Collection Time: 05/25/21  3:46 AM  ?Result  Value Ref Range  ? Sodium 138 135 - 145 mmol/L  ? Potassium 4.2 3.5 - 5.1 mmol/L  ? Chloride 98 98 - 111 mmol/L  ? CO2 31 22 - 32 mmol/L  ? Glucose, Bld 148 (H) 70 - 99 mg/dL  ? BUN 11 6 - 20 mg/dL  ? Creatinine, Ser 0.56 0.44 - 1.00 mg/dL  ? Calcium 8.2 (L) 8.9 - 10.3 mg/dL  ? GFR, Estimated >60 >60 mL/min  ? Anion gap 9 5 - 15  ?CBC     Status: Abnormal  ? Collection Time: 05/25/21  3:46 AM  ?Result Value Ref Range  ? WBC 20.7 (H) 4.0 - 10.5 K/uL  ? RBC 2.94 (L) 3.87 - 5.11 MIL/uL  ? Hemoglobin 8.7 (L) 12.0 - 15.0 g/dL  ? HCT 27.0 (L) 36.0 - 46.0 %  ? MCV 91.8 80.0 - 100.0 fL  ? MCH 29.6 26.0 - 34.0 pg  ? MCHC 32.2 30.0 - 36.0 g/dL  ? RDW 14.8 11.5 - 15.5 %  ? Platelets 614 (H) 150 - 400 K/uL  ? nRBC 0.0 0.0 - 0.2 %  ?Glucose, capillary     Status: Abnormal  ? Collection Time: 05/25/21  7:33 AM  ?Result Value Ref Range  ? Glucose-Capillary 145 (H) 70 - 99 mg/dL  ? ? ?Assessment & Plan: ?The plan of  care was discussed with the bedside nurse for the day, Claiborne Billings, who is in agreement with this plan and no additional concerns were raised.  ? ?Present on Admission: ? Neuroendocrine carcinoma ([place]) ? ? ? LOS: 20 days  ? ?Additional comments:I reviewed the patient's new clinical lab test results.   and I reviewed the patients new imaging test results.   ? ?Neuroendocrine tumor on colonoscopy - s/p exlap, R hemicolectomy, cholecystectomy 3/9. Well differentiated neuroendocrine tumor with 1/7 +nodes. D/w Dr. Marin Olp 3/15 will likely need adjuvant chemo. Staples out 3/24. Bottom 2cm of wound packed. ?Respiratory failure 2/2 volume overload/?PNA - CT chest 3/11 and 3/13 with no PE. Intubated 3/16, suspect PNA based on CXR but negative resp cx and BAL, s/p 7d empiric merrem. Decompensation 3/26, likely will need trach when settings lower, d/w husband. Discussed this patient with pulomology this AM, recommend continuing current care, no abx unless organism identified.  ?Volume overload - previously resolved, but 3/24 PM, went into flash pulmonary edema. Incr lasix gtt to 6. Echo 3/28, essentially normal.  ?Shock - hypotension likely due to medication effect, cont levo to allow for diuresis. PICC 3/27 ?Altered mental status - CT head 3/13, 3/16/ 3/17 normal (h/o VP shunt). Changed cefepime to zosyn 3/13, total 7d course-end today, empiric coverage for PNA. Resp cx today. Ucx 3/13 negative. Bcx 3/15 NGTD. LP 3/16 NGTD. CT A/P 3/16 negative. 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 Neurology on board, changed to keppra, cont EEG without sz, encephalopathy much improved  ?Diarrhea - d/c'd imodium to encourage NH4 loss, restarted 3/25, cholestyramine added yest, BMs decreased and thicker. ?vWD - humate P per Heme, end 3/12, stable ?Transaminitis - likely 2/2 TPN, now d/c'd, recheck 3/27 ?Leukocytosis - slowly downtrending, but remains in low 20s, CT CAP 3/27 unremarkable ?FEN - NPO, OGT, tol TF at  goal ?Hypernatremia - resolved ?ABLA - stable ?DVT - SCDs, s/p DDVAP, LMWH, LE duplex neg for DVT 3/13 ?Foley - replaced 3/18, d/c'd 3/24, replaced 3/25, cont for accurate I/O on lasix gtt ?Dispo - ICU   ?  ? ?Critical Care Total Time: 35 minutes ? ?Alford Gamero N.  Bobbye Morton, MD ?Trauma & General Surgery ?Please use [email or web address] to contact on call provider ? ?05/25/2021 ? ?*Care during the described time interval was provided by me. I have reviewed this patient's available data, including medical history, events of note, physical examination and test results as part of my evaluation. ? ?

## 2021-05-26 LAB — BASIC METABOLIC PANEL
Anion gap: 9 (ref 5–15)
BUN: 13 mg/dL (ref 6–20)
CO2: 37 mmol/L — ABNORMAL HIGH (ref 22–32)
Calcium: 8.3 mg/dL — ABNORMAL LOW (ref 8.9–10.3)
Chloride: 95 mmol/L — ABNORMAL LOW (ref 98–111)
Creatinine, Ser: 0.64 mg/dL (ref 0.44–1.00)
GFR, Estimated: >60 mL/min (ref >60)
Glucose, Bld: 143 mg/dL — ABNORMAL HIGH (ref 70–99)
Potassium: 4 mmol/L (ref 3.5–5.1)
Sodium: 141 mmol/L (ref 135–145)

## 2021-05-26 LAB — POCT I-STAT 7, (LYTES, BLD GAS, ICA,H+H)
Acid-Base Excess: 15 mmol/L — ABNORMAL HIGH (ref 0.0–2.0)
Bicarbonate: 41.5 mmol/L — ABNORMAL HIGH (ref 20.0–28.0)
Calcium, Ion: 1.14 mmol/L — ABNORMAL LOW (ref 1.15–1.40)
HCT: 27 % — ABNORMAL LOW (ref 36.0–46.0)
Hemoglobin: 9.2 g/dL — ABNORMAL LOW (ref 12.0–15.0)
O2 Saturation: 90 %
Patient temperature: 101.7
Potassium: 4 mmol/L (ref 3.5–5.1)
Sodium: 141 mmol/L (ref 135–145)
TCO2: 44 mmol/L — ABNORMAL HIGH (ref 22–32)
pCO2 arterial: 71.7 mmHg (ref 32–48)
pH, Arterial: 7.378 (ref 7.35–7.45)
pO2, Arterial: 70 mmHg — ABNORMAL LOW (ref 83–108)

## 2021-05-26 LAB — CBC
HCT: 27.5 % — ABNORMAL LOW (ref 36.0–46.0)
Hemoglobin: 8.8 g/dL — ABNORMAL LOW (ref 12.0–15.0)
MCH: 29.3 pg (ref 26.0–34.0)
MCHC: 32 g/dL (ref 30.0–36.0)
MCV: 91.7 fL (ref 80.0–100.0)
Platelets: 509 10*3/uL — ABNORMAL HIGH (ref 150–400)
RBC: 3 MIL/uL — ABNORMAL LOW (ref 3.87–5.11)
RDW: 14.5 % (ref 11.5–15.5)
WBC: 19.2 10*3/uL — ABNORMAL HIGH (ref 4.0–10.5)
nRBC: 0 % (ref 0.0–0.2)

## 2021-05-26 LAB — GLUCOSE, CAPILLARY
Glucose-Capillary: 105 mg/dL — ABNORMAL HIGH (ref 70–99)
Glucose-Capillary: 141 mg/dL — ABNORMAL HIGH (ref 70–99)
Glucose-Capillary: 143 mg/dL — ABNORMAL HIGH (ref 70–99)
Glucose-Capillary: 153 mg/dL — ABNORMAL HIGH (ref 70–99)
Glucose-Capillary: 157 mg/dL — ABNORMAL HIGH (ref 70–99)
Glucose-Capillary: 160 mg/dL — ABNORMAL HIGH (ref 70–99)

## 2021-05-26 MED ORDER — ALBUTEROL SULFATE (2.5 MG/3ML) 0.083% IN NEBU
2.5000 mg | INHALATION_SOLUTION | RESPIRATORY_TRACT | Status: DC
  Administered 2021-05-26 – 2021-05-30 (×25): 2.5 mg via RESPIRATORY_TRACT
  Filled 2021-05-26 (×25): qty 3

## 2021-05-26 MED ORDER — LOPERAMIDE HCL 1 MG/7.5ML PO SUSP
4.0000 mg | Freq: Three times a day (TID) | ORAL | Status: DC
  Administered 2021-05-26 – 2021-06-02 (×17): 4 mg
  Filled 2021-05-26 (×23): qty 30

## 2021-05-26 NOTE — Progress Notes (Signed)
? ?Trauma/Critical Care Follow Up Note ? ?Subjective:  ?  ?Overnight Issues:  ? ?Objective:  ?Vital signs for last 24 hours: ?Temp:  [99.4 ?F (37.4 ?C)-101.7 ?F (38.7 ?C)] 101.7 ?F (38.7 ?C) (03/30 0400) ?Pulse Rate:  [82-97] 93 (03/30 0400) ?Resp:  [12-30] 21 (03/30 0600) ?BP: (94-130)/(40-57) 110/52 (03/30 0400) ?SpO2:  [90 %-98 %] 97 % (03/30 0400) ?Arterial Line BP: (106-133)/(54-59) 127/58 (03/30 0600) ?FiO2 (%):  [50 %-70 %] 60 % (03/30 0311) ? ?Hemodynamic parameters for last 24 hours: ?  ? ?Intake/Output from previous day: ?03/29 0701 - 03/30 0700 ?In: 2677.1 [I.V.:1412.1; NG/GT:1265] ?Out: 4825 [FUXNA:3557; Stool:650]  ?Intake/Output this shift: ?No intake/output data recorded. ? ?Vent settings for last 24 hours: ?Vent Mode: PRVC ?FiO2 (%):  [50 %-70 %] 60 % ?Set Rate:  [24 bmp] 24 bmp ?Vt Set:  [340 mL] 340 mL ?PEEP:  [16 cmH20] 16 cmH20 ?Plateau Pressure:  [26 cmH20-30 cmH20] 28 cmH20 ? ?Physical Exam:  ?Gen: comfortable, no distress ?Neuro: sedated ?HEENT: PERRL ?Neck: supple ?CV: RRR ?Pulm: unlabored breathing ?Abd: soft, NT ?GU: clear yellow urine ?Extr: wwp, no edema ? ? ?Results for orders placed or performed during the hospital encounter of 05/05/21 (from the past 24 hour(s))  ?Glucose, capillary     Status: Abnormal  ? Collection Time: 05/25/21 11:24 AM  ?Result Value Ref Range  ? Glucose-Capillary 161 (H) 70 - 99 mg/dL  ?Glucose, capillary     Status: Abnormal  ? Collection Time: 05/25/21  3:45 PM  ?Result Value Ref Range  ? Glucose-Capillary 165 (H) 70 - 99 mg/dL  ?Glucose, capillary     Status: Abnormal  ? Collection Time: 05/25/21  7:37 PM  ?Result Value Ref Range  ? Glucose-Capillary 158 (H) 70 - 99 mg/dL  ?Glucose, capillary     Status: Abnormal  ? Collection Time: 05/25/21 11:41 PM  ?Result Value Ref Range  ? Glucose-Capillary 158 (H) 70 - 99 mg/dL  ?Basic metabolic panel     Status: Abnormal  ? Collection Time: 05/26/21  3:35 AM  ?Result Value Ref Range  ? Sodium 141 135 - 145 mmol/L  ?  Potassium 4.0 3.5 - 5.1 mmol/L  ? Chloride 95 (L) 98 - 111 mmol/L  ? CO2 37 (H) 22 - 32 mmol/L  ? Glucose, Bld 143 (H) 70 - 99 mg/dL  ? BUN 13 6 - 20 mg/dL  ? Creatinine, Ser 0.64 0.44 - 1.00 mg/dL  ? Calcium 8.3 (L) 8.9 - 10.3 mg/dL  ? GFR, Estimated >60 >60 mL/min  ? Anion gap 9 5 - 15  ?CBC     Status: Abnormal  ? Collection Time: 05/26/21  3:35 AM  ?Result Value Ref Range  ? WBC 19.2 (H) 4.0 - 10.5 K/uL  ? RBC 3.00 (L) 3.87 - 5.11 MIL/uL  ? Hemoglobin 8.8 (L) 12.0 - 15.0 g/dL  ? HCT 27.5 (L) 36.0 - 46.0 %  ? MCV 91.7 80.0 - 100.0 fL  ? MCH 29.3 26.0 - 34.0 pg  ? MCHC 32.0 30.0 - 36.0 g/dL  ? RDW 14.5 11.5 - 15.5 %  ? Platelets 509 (H) 150 - 400 K/uL  ? nRBC 0.0 0.0 - 0.2 %  ?Glucose, capillary     Status: Abnormal  ? Collection Time: 05/26/21  3:37 AM  ?Result Value Ref Range  ? Glucose-Capillary 157 (H) 70 - 99 mg/dL  ?Glucose, capillary     Status: Abnormal  ? Collection Time: 05/26/21  7:07 AM  ?Result Value  Ref Range  ? Glucose-Capillary 105 (H) 70 - 99 mg/dL  ? ? ?Assessment & Plan: ?The plan of care was discussed with the bedside nurse for the day, who is in agreement with this plan and no additional concerns were raised.  ? ?Present on Admission: ? Neuroendocrine carcinoma ([place]) ? ? ? LOS: 21 days  ? ?Additional comments:I reviewed the patient's new clinical lab test results.   and I reviewed the patients new imaging test results.   ? ?Neuroendocrine tumor on colonoscopy - s/p exlap, R hemicolectomy, cholecystectomy 3/9. Well differentiated neuroendocrine tumor with 1/7 +nodes. D/w Dr. Marin Olp 3/15 will likely need adjuvant chemo. Staples out 3/24. Bottom 2cm of wound packed. ?Respiratory failure 2/2 volume overload/?PNA - CT chest 3/11 and 3/13 with no PE. Intubated 3/16, suspect PNA based on CXR but negative resp cx and BAL, s/p 7d empiric merrem. Decompensation 3/26, likely will need trach when settings lower, d/w husband. Discussed this patient with pulomology 3/29 AM, recommend continuing current  care, no abx unless organism identified.  ?Volume overload - previously resolved, but 3/24 PM, went into flash pulmonary edema. Decr lasix gtt to 5. Echo 3/28, essentially normal.  ?Shock - hypotension likely due to medication effect, cont levo to allow for diuresis. Currently off. PICC 3/27 ?Altered mental status - CT head 3/13, 3/16/ 3/17 normal (h/o VP shunt). Changed cefepime to zosyn 3/13, total 7d course-end today, empiric coverage for PNA. Resp cx today. Ucx 3/13 negative. Bcx 3/15 NGTD. LP 3/16 NGTD. CT A/P 3/16 negative. 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 Neurology on board, changed to keppra, cont EEG without sz, encephalopathy much improved  ?Diarrhea - d/c'd imodium to encourage NH4 loss, restarted 3/25, incr to TID today, cont cholestyramine ?vWD - humate P per Heme, end 3/12, stable ?Transaminitis - likely 2/2 TPN, now d/c'd, recheck 3/27 ?Leukocytosis - slowly downtrending, 19 thsi AM, infectious workup unrevealing, curbside ID consult 3/28 with no add'l recs  ?FEN - NPO, OGT, tol TF at goal ?Hypernatremia - resolved ?ABLA - stable ?DVT - SCDs, s/p DDVAP, LMWH, LE duplex neg for DVT 3/13 ?Foley - replaced 3/18, d/c'd 3/24, replaced 3/25, cont for accurate I/O on lasix gtt ?Dispo - ICU   ? ?Critical Care Total Time: 35 minutes ? ?[name], MD ?Trauma & General Surgery ?Please use [email or web address] to contact on call provider ? ?05/26/2021 ? ?*Care during the described time interval was provided by me. I have reviewed this patient's available data, including medical history, events of note, physical examination and test results as part of my evaluation. ? ? ? ?

## 2021-05-27 ENCOUNTER — Inpatient Hospital Stay (HOSPITAL_COMMUNITY): Payer: No Typology Code available for payment source

## 2021-05-27 DIAGNOSIS — R509 Fever, unspecified: Secondary | ICD-10-CM | POA: Diagnosis not present

## 2021-05-27 LAB — POCT I-STAT 7, (LYTES, BLD GAS, ICA,H+H)
Acid-Base Excess: 13 mmol/L — ABNORMAL HIGH (ref 0.0–2.0)
Acid-Base Excess: 14 mmol/L — ABNORMAL HIGH (ref 0.0–2.0)
Bicarbonate: 40.1 mmol/L — ABNORMAL HIGH (ref 20.0–28.0)
Bicarbonate: 40.8 mmol/L — ABNORMAL HIGH (ref 20.0–28.0)
Calcium, Ion: 1.13 mmol/L — ABNORMAL LOW (ref 1.15–1.40)
Calcium, Ion: 1.16 mmol/L (ref 1.15–1.40)
HCT: 24 % — ABNORMAL LOW (ref 36.0–46.0)
HCT: 24 % — ABNORMAL LOW (ref 36.0–46.0)
Hemoglobin: 8.2 g/dL — ABNORMAL LOW (ref 12.0–15.0)
Hemoglobin: 8.2 g/dL — ABNORMAL LOW (ref 12.0–15.0)
O2 Saturation: 92 %
O2 Saturation: 92 %
Patient temperature: 99.3
Potassium: 4.2 mmol/L (ref 3.5–5.1)
Potassium: 3.8 mmol/L (ref 3.5–5.1)
Sodium: 141 mmol/L (ref 135–145)
Sodium: 140 mmol/L (ref 135–145)
TCO2: 42 mmol/L — ABNORMAL HIGH (ref 22–32)
TCO2: 43 mmol/L — ABNORMAL HIGH (ref 22–32)
pCO2 arterial: 75.2 mmHg (ref 32–48)
pCO2 arterial: 71.3 mmHg (ref 32–48)
pH, Arterial: 7.337 — ABNORMAL LOW (ref 7.35–7.45)
pH, Arterial: 7.365 (ref 7.35–7.45)
pO2, Arterial: 74 mmHg — ABNORMAL LOW (ref 83–108)
pO2, Arterial: 68 mmHg — ABNORMAL LOW (ref 83–108)

## 2021-05-27 LAB — COMPREHENSIVE METABOLIC PANEL
ALT: 50 U/L — ABNORMAL HIGH (ref 0–44)
AST: 41 U/L (ref 15–41)
Albumin: <1.5 g/dL — ABNORMAL LOW (ref 3.5–5.0)
Alkaline Phosphatase: 246 U/L — ABNORMAL HIGH (ref 38–126)
Anion gap: 6 (ref 5–15)
BUN: 18 mg/dL (ref 6–20)
CO2: 38 mmol/L — ABNORMAL HIGH (ref 22–32)
Calcium: 8.1 mg/dL — ABNORMAL LOW (ref 8.9–10.3)
Chloride: 96 mmol/L — ABNORMAL LOW (ref 98–111)
Creatinine, Ser: 0.72 mg/dL (ref 0.44–1.00)
GFR, Estimated: >60 mL/min (ref >60)
Glucose, Bld: 138 mg/dL — ABNORMAL HIGH (ref 70–99)
Potassium: 4 mmol/L (ref 3.5–5.1)
Sodium: 140 mmol/L (ref 135–145)
Total Bilirubin: 0.2 mg/dL — ABNORMAL LOW (ref 0.3–1.2)
Total Protein: 6.4 g/dL — ABNORMAL LOW (ref 6.5–8.1)

## 2021-05-27 LAB — CBC
HCT: 24.8 % — ABNORMAL LOW (ref 36.0–46.0)
Hemoglobin: 7.6 g/dL — ABNORMAL LOW (ref 12.0–15.0)
MCH: 28.7 pg (ref 26.0–34.0)
MCHC: 30.6 g/dL (ref 30.0–36.0)
MCV: 93.6 fL (ref 80.0–100.0)
Platelets: 472 10*3/uL — ABNORMAL HIGH (ref 150–400)
RBC: 2.65 MIL/uL — ABNORMAL LOW (ref 3.87–5.11)
RDW: 14.1 % (ref 11.5–15.5)
WBC: 14.1 10*3/uL — ABNORMAL HIGH (ref 4.0–10.5)
nRBC: 0 % (ref 0.0–0.2)

## 2021-05-27 LAB — GLUCOSE, CAPILLARY
Glucose-Capillary: 180 mg/dL — ABNORMAL HIGH (ref 70–99)
Glucose-Capillary: 152 mg/dL — ABNORMAL HIGH (ref 70–99)
Glucose-Capillary: 164 mg/dL — ABNORMAL HIGH (ref 70–99)
Glucose-Capillary: 169 mg/dL — ABNORMAL HIGH (ref 70–99)
Glucose-Capillary: 173 mg/dL — ABNORMAL HIGH (ref 70–99)
Glucose-Capillary: 131 mg/dL — ABNORMAL HIGH (ref 70–99)

## 2021-05-27 LAB — PREALBUMIN: Prealbumin: 16 mg/dL — ABNORMAL LOW (ref 18–38)

## 2021-05-27 MED ORDER — OXYCODONE HCL 5 MG/5ML PO SOLN
10.0000 mg | ORAL | Status: DC
  Administered 2021-05-27 – 2021-06-03 (×38): 10 mg
  Filled 2021-05-27 (×39): qty 10

## 2021-05-27 MED ORDER — DIPHENOXYLATE-ATROPINE 2.5-0.025 MG/5ML PO LIQD
5.0000 mL | Freq: Four times a day (QID) | ORAL | Status: DC
  Administered 2021-05-27 – 2021-06-02 (×22): 5 mL
  Filled 2021-05-27 (×22): qty 5

## 2021-05-27 MED ORDER — DIAZEPAM 1 MG/ML PO SOLN: 5.0000 mg | Freq: Four times a day (QID) | ORAL | Status: DC

## 2021-05-27 MED ORDER — DIAZEPAM 5 MG PO TABS
5.0000 mg | ORAL_TABLET | Freq: Four times a day (QID) | ORAL | Status: DC
  Administered 2021-05-27 – 2021-05-28 (×4): 5 mg
  Filled 2021-05-27 (×4): qty 1

## 2021-05-27 NOTE — Progress Notes (Signed)
RT NOTE: ? ?RT called to assess patient due to peak pressuring vent and desaturation. PP 48, Plat 36, Mve 4.2 l/min and SpO2 dropping to 74% at lowest. Pt lavaged and bagged with minimal secretion return. Increase resistance met while bagging patient. Breath sounds very diminished w/end exp wh.  ?RN pushed prn Vec. PRN Albuterol given x2 (MD aware).  ?Slight improvement. PP decreased to 40, Mve back to 8.4 l/min and Spo2 90% (Fio2 50%). Fio2 increased to 60% (Spo2 93%). ?Dr. Zenia Resides called and made aware. Chest xray ordered.  ?RT will monitor and update MD if needed.  ?

## 2021-05-27 NOTE — Progress Notes (Signed)
Patient peak pressuring, vec, fentanyl, and versed given. Patient desatted to 72, bagged with resistance. Dr. Zenia Resides aware, CXR ordered, will continue to monitor.  ?

## 2021-05-27 NOTE — Progress Notes (Signed)
Lower extremity venous has been completed.  ? ?Preliminary results in CV Proc.  ? ?Kristinia Leavy Chrystine Frogge ?05/27/2021 3:08 PM    ?

## 2021-05-27 NOTE — Progress Notes (Signed)
? ?Trauma/Critical Care Follow Up Note ? ?Subjective:  ?  ?Overnight Issues:  ? ?Objective:  ?Vital signs for last 24 hours: ?Temp:  [99.3 ?F (37.4 ?C)-102.2 ?F (39 ?C)] 102 ?F (38.9 ?C) (03/31 0800) ?Pulse Rate:  [82-115] 91 (03/31 0905) ?Resp:  [18-26] 24 (03/31 0905) ?BP: (85-120)/(40-69) 85/69 (03/31 0900) ?SpO2:  [88 %-100 %] 92 % (03/31 0905) ?Arterial Line BP: (83-156)/(45-85) 154/63 (03/31 0905) ?FiO2 (%):  [50 %-60 %] 50 % (03/31 0740) ? ?Hemodynamic parameters for last 24 hours: ?  ? ?Intake/Output from previous day: ?03/30 0701 - 03/31 0700 ?In: 2852.9 [I.V.:1462.9; NG/GT:1390] ?Out: 1175 [Urine:1175]  ?Intake/Output this shift: ?Total I/O ?In: 350 [NG/GT:350] ?Out: -  ? ?Vent settings for last 24 hours: ?Vent Mode: PRVC ?FiO2 (%):  [50 %-60 %] 50 % ?Set Rate:  [24 bmp-240 bmp] 240 bmp ?Vt Set:  [340 mL] 340 mL ?PEEP:  [16 cmH20] 16 cmH20 ?Plateau Pressure:  [30 AGT36-46 cmH20] 37 cmH20 ? ?Physical Exam:  ?Gen: comfortable, no distress ?Neuro: sedated ?HEENT: PERRL ?Neck: supple ?CV: RRR ?Pulm: unlabored breathing ?Abd: soft, NT, incision c/d/I, inferior aspect of wound packed ?GU: clear yellow urine ?Extr: wwp, no edema ? ? ?Results for orders placed or performed during the hospital encounter of 05/05/21 (from the past 24 hour(s))  ?Glucose, capillary     Status: Abnormal  ? Collection Time: 05/26/21 11:25 AM  ?Result Value Ref Range  ? Glucose-Capillary 143 (H) 70 - 99 mg/dL  ? Comment 1 Notify RN   ? Comment 2 Document in Chart   ?Glucose, capillary     Status: Abnormal  ? Collection Time: 05/26/21  3:53 PM  ?Result Value Ref Range  ? Glucose-Capillary 141 (H) 70 - 99 mg/dL  ? Comment 1 Notify RN   ? Comment 2 Document in Chart   ?Glucose, capillary     Status: Abnormal  ? Collection Time: 05/26/21  7:18 PM  ?Result Value Ref Range  ? Glucose-Capillary 160 (H) 70 - 99 mg/dL  ?Glucose, capillary     Status: Abnormal  ? Collection Time: 05/26/21 11:31 PM  ?Result Value Ref Range  ? Glucose-Capillary 153  (H) 70 - 99 mg/dL  ?Glucose, capillary     Status: Abnormal  ? Collection Time: 05/27/21  3:14 AM  ?Result Value Ref Range  ? Glucose-Capillary 180 (H) 70 - 99 mg/dL  ?I-STAT 7, (LYTES, BLD GAS, ICA, H+H)     Status: Abnormal  ? Collection Time: 05/27/21  3:16 AM  ?Result Value Ref Range  ? pH, Arterial 7.337 (L) 7.35 - 7.45  ? pCO2 arterial 75.2 (HH) 32 - 48 mmHg  ? pO2, Arterial 74 (L) 83 - 108 mmHg  ? Bicarbonate 40.1 (H) 20.0 - 28.0 mmol/L  ? TCO2 42 (H) 22 - 32 mmol/L  ? O2 Saturation 92 %  ? Acid-Base Excess 13.0 (H) 0.0 - 2.0 mmol/L  ? Sodium 141 135 - 145 mmol/L  ? Potassium 4.2 3.5 - 5.1 mmol/L  ? Calcium, Ion 1.13 (L) 1.15 - 1.40 mmol/L  ? HCT 24.0 (L) 36.0 - 46.0 %  ? Hemoglobin 8.2 (L) 12.0 - 15.0 g/dL  ? Patient temperature 99.3 F   ? Collection site art line   ? Drawn by Operator   ? Sample type ARTERIAL   ? Comment NOTIFIED PHYSICIAN   ?CBC     Status: Abnormal  ? Collection Time: 05/27/21  5:45 AM  ?Result Value Ref Range  ? WBC 14.1 (H) 4.0 -  10.5 K/uL  ? RBC 2.65 (L) 3.87 - 5.11 MIL/uL  ? Hemoglobin 7.6 (L) 12.0 - 15.0 g/dL  ? HCT 24.8 (L) 36.0 - 46.0 %  ? MCV 93.6 80.0 - 100.0 fL  ? MCH 28.7 26.0 - 34.0 pg  ? MCHC 30.6 30.0 - 36.0 g/dL  ? RDW 14.1 11.5 - 15.5 %  ? Platelets 472 (H) 150 - 400 K/uL  ? nRBC 0.0 0.0 - 0.2 %  ?Comprehensive metabolic panel     Status: Abnormal  ? Collection Time: 05/27/21  5:45 AM  ?Result Value Ref Range  ? Sodium 140 135 - 145 mmol/L  ? Potassium 4.0 3.5 - 5.1 mmol/L  ? Chloride 96 (L) 98 - 111 mmol/L  ? CO2 38 (H) 22 - 32 mmol/L  ? Glucose, Bld 138 (H) 70 - 99 mg/dL  ? BUN 18 6 - 20 mg/dL  ? Creatinine, Ser 0.72 0.44 - 1.00 mg/dL  ? Calcium 8.1 (L) 8.9 - 10.3 mg/dL  ? Total Protein 6.4 (L) 6.5 - 8.1 g/dL  ? Albumin <1.5 (L) 3.5 - 5.0 g/dL  ? AST 41 15 - 41 U/L  ? ALT 50 (H) 0 - 44 U/L  ? Alkaline Phosphatase 246 (H) 38 - 126 U/L  ? Total Bilirubin 0.2 (L) 0.3 - 1.2 mg/dL  ? GFR, Estimated >60 >60 mL/min  ? Anion gap 6 5 - 15  ?Glucose, capillary     Status: Abnormal   ? Collection Time: 05/27/21  8:00 AM  ?Result Value Ref Range  ? Glucose-Capillary 152 (H) 70 - 99 mg/dL  ? ? ?Assessment & Plan: ?The plan of care was discussed with the bedside nurse for the day, Lauren, who is in agreement with this plan and no additional concerns were raised.  ? ?Present on Admission: ? Neuroendocrine carcinoma ([place]) ? ? ? LOS: 22 days  ? ?Additional comments:I reviewed the patient's new clinical lab test results.   and I reviewed the patients new imaging test results.   ? ?Neuroendocrine tumor on colonoscopy - s/p exlap, R hemicolectomy, cholecystectomy 3/9. Well differentiated neuroendocrine tumor with 1/7 +nodes. D/w Dr. Marin Olp 3/15 will likely need adjuvant chemo. Staples out 3/24. Bottom 2cm of wound packed. ?Respiratory failure 2/2 volume overload/?PNA - CT chest 3/11 and 3/13 with no PE. Intubated 3/16, suspect PNA based on CXR but negative resp cx and BAL, s/p 7d empiric merrem. Decompensation 3/26, likely will need trach when settings lower, d/w husband. Discussed this patient with pulomology 3/29 AM, recommend continuing current care, no abx unless organism identified.  ?Volume overload - previously resolved, but 3/24 PM, went into flash pulmonary edema. Incr lasix gtt to 8 due to sedation causing increased intake. Incr sch PO oxy and add valium to help decr drip reqts. Echo 3/28, essentially normal.  ?Shock - resolved, levo available to augment diuresis. Currently off. PICC 3/27 ?Altered mental status - CT head 3/13, 3/16/ 3/17 normal (h/o VP shunt). Changed cefepime to zosyn 3/13, total 7d course-end today, empiric coverage for PNA. Resp cx today. Ucx 3/13 negative. Bcx 3/15 NGTD. LP 3/16 NGTD. CT A/P 3/16 negative. 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 Neurology on board, changed to keppra, cont EEG without sz, encephalopathy much improved  ?Diarrhea - d/c'd imodium to encourage NH4 loss, restarted 3/25, incr to TID, cont cholestyramine TID, add lomotil ?vWD  - humate P per Heme, end 3/12, stable ?Transaminitis - likely 2/2 TPN, now d/c'd, recheck 4/3 ?Leukocytosis, fever - resolving, 14 this  AM, infectious workup unrevealing, curbside ID consult 3/28 with no add'l recs, check LE duplex ?FEN - NPO, OGT, tol TF at goal, check PA, d/w nutrition with RD ?Hypernatremia - resolved ?ABLA - stable ?DVT - SCDs, s/p DDVAP, LMWH, LE duplex neg for DVT 3/13 ?Foley - replaced 3/18, d/c'd 3/24, replaced 3/25, cont for accurate I/O on lasix gtt ?Dispo - ICU   ? ?Critical Care Total Time: 35 minutes ? ?[name], MD ?Trauma & General Surgery ?Please use [email or web address] to contact on call provider ? ?05/27/2021 ? ?*Care during the described time interval was provided by me. I have reviewed this patient's available data, including medical history, events of note, physical examination and test results as part of my evaluation. ? ? ? ?

## 2021-05-28 ENCOUNTER — Inpatient Hospital Stay (HOSPITAL_COMMUNITY): Payer: No Typology Code available for payment source

## 2021-05-28 DIAGNOSIS — C7A8 Other malignant neuroendocrine tumors: Secondary | ICD-10-CM | POA: Diagnosis not present

## 2021-05-28 DIAGNOSIS — J189 Pneumonia, unspecified organism: Secondary | ICD-10-CM

## 2021-05-28 LAB — COMPREHENSIVE METABOLIC PANEL
ALT: 88 U/L — ABNORMAL HIGH (ref 0–44)
AST: 85 U/L — ABNORMAL HIGH (ref 15–41)
Albumin: 1.7 g/dL — ABNORMAL LOW (ref 3.5–5.0)
Alkaline Phosphatase: 288 U/L — ABNORMAL HIGH (ref 38–126)
Anion gap: 9 (ref 5–15)
BUN: 16 mg/dL (ref 6–20)
CO2: 41 mmol/L — ABNORMAL HIGH (ref 22–32)
Calcium: 7.9 mg/dL — ABNORMAL LOW (ref 8.9–10.3)
Chloride: 95 mmol/L — ABNORMAL LOW (ref 98–111)
Creatinine, Ser: 0.68 mg/dL (ref 0.44–1.00)
GFR, Estimated: >60 mL/min (ref >60)
Glucose, Bld: 171 mg/dL — ABNORMAL HIGH (ref 70–99)
Potassium: 3.7 mmol/L (ref 3.5–5.1)
Sodium: 145 mmol/L (ref 135–145)
Total Bilirubin: 0.5 mg/dL (ref 0.3–1.2)
Total Protein: 7 g/dL (ref 6.5–8.1)

## 2021-05-28 LAB — GLUCOSE, CAPILLARY
Glucose-Capillary: 142 mg/dL — ABNORMAL HIGH (ref 70–99)
Glucose-Capillary: 154 mg/dL — ABNORMAL HIGH (ref 70–99)
Glucose-Capillary: 157 mg/dL — ABNORMAL HIGH (ref 70–99)
Glucose-Capillary: 165 mg/dL — ABNORMAL HIGH (ref 70–99)
Glucose-Capillary: 169 mg/dL — ABNORMAL HIGH (ref 70–99)
Glucose-Capillary: 176 mg/dL — ABNORMAL HIGH (ref 70–99)

## 2021-05-28 LAB — POCT I-STAT 7, (LYTES, BLD GAS, ICA,H+H)
Acid-Base Excess: 19 mmol/L — ABNORMAL HIGH (ref 0.0–2.0)
Bicarbonate: 45.5 mmol/L — ABNORMAL HIGH (ref 20.0–28.0)
Calcium, Ion: 1.07 mmol/L — ABNORMAL LOW (ref 1.15–1.40)
HCT: 22 % — ABNORMAL LOW (ref 36.0–46.0)
Hemoglobin: 7.5 g/dL — ABNORMAL LOW (ref 12.0–15.0)
O2 Saturation: 91 %
Patient temperature: 98.5
Potassium: 3.3 mmol/L — ABNORMAL LOW (ref 3.5–5.1)
Sodium: 141 mmol/L (ref 135–145)
TCO2: 48 mmol/L — ABNORMAL HIGH (ref 22–32)
pCO2 arterial: 70.2 mmHg (ref 32–48)
pH, Arterial: 7.42 (ref 7.35–7.45)
pO2, Arterial: 62 mmHg — ABNORMAL LOW (ref 83–108)

## 2021-05-28 LAB — BODY FLUID CELL COUNT WITH DIFFERENTIAL
Eos, Fluid: 0 %
Lymphs, Fluid: 45 %
Monocyte-Macrophage-Serous Fluid: 7 % — ABNORMAL LOW (ref 50–90)
Neutrophil Count, Fluid: 48 % — ABNORMAL HIGH (ref 0–25)
Total Nucleated Cell Count, Fluid: 35 cu mm (ref 0–1000)

## 2021-05-28 LAB — CBC
HCT: 27.1 % — ABNORMAL LOW (ref 36.0–46.0)
Hemoglobin: 8.3 g/dL — ABNORMAL LOW (ref 12.0–15.0)
MCH: 29 pg (ref 26.0–34.0)
MCHC: 30.6 g/dL (ref 30.0–36.0)
MCV: 94.8 fL (ref 80.0–100.0)
Platelets: 486 10*3/uL — ABNORMAL HIGH (ref 150–400)
RBC: 2.86 MIL/uL — ABNORMAL LOW (ref 3.87–5.11)
RDW: 14.4 % (ref 11.5–15.5)
WBC: 18.9 10*3/uL — ABNORMAL HIGH (ref 4.0–10.5)
nRBC: 0 % (ref 0.0–0.2)

## 2021-05-28 MED ORDER — ATROPINE SULFATE 1 MG/10ML IJ SOSY
PREFILLED_SYRINGE | INTRAMUSCULAR | Status: AC
  Filled 2021-05-28: qty 10

## 2021-05-28 MED ORDER — ARFORMOTEROL TARTRATE 15 MCG/2ML IN NEBU
15.0000 ug | INHALATION_SOLUTION | Freq: Two times a day (BID) | RESPIRATORY_TRACT | Status: DC
  Administered 2021-05-28 – 2021-06-15 (×36): 15 ug via RESPIRATORY_TRACT
  Filled 2021-05-28 (×36): qty 2

## 2021-05-28 MED ORDER — VITAL 1.5 CAL PO LIQD
1000.0000 mL | ORAL | Status: DC
  Administered 2021-05-31 – 2021-06-10 (×7): 1000 mL
  Filled 2021-05-28 (×12): qty 1000

## 2021-05-28 MED ORDER — MIDAZOLAM HCL 2 MG/2ML IJ SOLN: 2.0000 mg | INTRAMUSCULAR | Status: DC | PRN

## 2021-05-28 MED ORDER — DIAZEPAM 5 MG PO TABS
10.0000 mg | ORAL_TABLET | Freq: Four times a day (QID) | ORAL | Status: DC
  Administered 2021-05-28 – 2021-06-06 (×36): 10 mg
  Filled 2021-05-28 (×36): qty 2

## 2021-05-28 MED ORDER — PROSOURCE TF PO LIQD
45.0000 mL | Freq: Every day | ORAL | Status: DC
  Administered 2021-05-29 – 2021-06-13 (×15): 45 mL
  Filled 2021-05-28 (×15): qty 45

## 2021-05-28 MED ORDER — REVEFENACIN 175 MCG/3ML IN SOLN
175.0000 ug | Freq: Every day | RESPIRATORY_TRACT | Status: DC
  Administered 2021-05-28 – 2021-06-15 (×18): 175 ug via RESPIRATORY_TRACT
  Filled 2021-05-28 (×19): qty 3

## 2021-05-28 NOTE — Consult Note (Addendum)
? ?NAME:  Cassidy Tran, MRN:  [id number], DOB:  August 13, 1975, LOS: 23 ?ADMISSION DATE:  05/05/2021, CONSULTATION DATE:  05/28/21 ?REFERRING MD:  Bobbye Morton, CHIEF COMPLAINT:  acute hypoxic respiratory failure ? 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 Course has been complicated by acute encephalopathy possibly related to hyperammonemia from depakote, ARDS with intubation on 3/16 with incremental improvement for which we have been consulted.  ? ?Pertinent  Medical History  ?Asthma ?Smoking  ?Obstructive and likely restrictive lung disease ?Epilepsy ?Chiari malformation s/p VPS ?VWF disease ?Biliary dyskinesia ?NET ? ?Significant Hospital Events: ?Including procedures, antibiotic start and stop dates in addition to other pertinent events   ?3/9 ex lap, R hemicolectomy, chole, staples removed 3/24 ?3/12 end date for humate P ?3/13-3/16 concern for AMS due to hyperammonemia ?3/16 intubation ? ?Interim History / Subjective:  ? ? ?Objective   ?Blood pressure (!) 113/54, pulse 98, temperature (!) 101.3 ?F (38.5 ?C), temperature source Axillary, resp. rate (!) 21, height _0  (1.6 m), weight 89 kg, last menstrual period 09/16/2012, SpO2 100 %. ?   ?Vent Mode: PRVC ?FiO2 (%):  [40 %-60 %] 60 % ?Set Rate:  [24 bmp] 24 bmp ?Vt Set:  [340 mL] 340 mL ?PEEP:  [12 cmH20-16 cmH20] 12 cmH20 ?Plateau Pressure:  [29 JSE83-15 cmH20] 33 cmH20  ? ?Intake/Output Summary (Last 24 hours) at 05/28/2021 1456 ?Last data filed at 05/28/2021 1400 ?Gross per 24 hour  ?Intake 2472.36 ml  ?Output 4731 ml  ?Net -2258.64 ml  ? ?Filed Weights  ? 05/23/21 0500 05/24/21 0423 05/28/21 0500  ?Weight: 87.3 kg 87.3 kg 89 kg  ? ? ?Examination: ?General  appearance: 46 y.o., female, intubated ?Eyes: anicteric sclerae; PERRL, tracking appropriately ?HENT: NCAT; MMM ?Neck: Trachea midline; no lymphadenopathy, no JVD ?Lungs: CTAB, no crackles, no wheeze, with normal respiratory effort ?CV: RRR, no murmur  ?Abdomen: Soft, non-tender; non-distended, BS present  ?Extremities: No peripheral edema, warm ?Skin: Normal turgor and texture; no rash ?Neuro: deeply sedated ? ?On current settings: Plateau 29, Peak 29 ? ? ?Resolved Hospital Problem list   ?N/a ? ?Assessment & Plan:  ? ?# Acute hypoxic respiratory failure: ?Most recent CT Chest with crazy paving pattern. If this is just ARDS then is relatively late in course (>14d) where steroids aren't generally as helpful and could be harmful. Most likely ARDS (with many potential etiologies) with some volume overload but other considerations include organizing pneumonia, eosinophilic pneumonia, pulmonary alveolar proteinosis which could be primary or secondary but this is super rare. I think a bronch/BAL to check cell count/diff to look for evidence of either organizing pneumonia or eosinophilic pneumonia and to re-evaluate for infection is reasonable however before considering a trial of steroids.  ?- s/p bronch/BAL today, sent for cell count/diff, micro, cyto ?- continue to diurese for net negative fluid balance ?- bronchodilators - can trial brovana/yupelri given her history of obstructive lung disease although she doesn't have elevated Ppeak - Pplat  ?- LTVV, PAD protocol, VAP bundle  ?- agree with attempts to lighten sedation as able targeting rass -1 to -2 unless again develops dyssynchrony leading to desaturation or hemodynamic instability ?- will consider trial of steroids tomorrow if not making any progress after reviewing BAL results ?-  we can also consider perc trach on pretty much current settings if sedation/dyssynchrony continue to limit attempts at vent liberation ? ? ?Best Practice (right click and "Reselect all  SmartList Selections" daily)  ? ?Per primary ? ?Labs   ?CBC: ?Recent Labs  ?Lab 05/24/21 ?7371 05/24/21 ?1130 05/25/21 ?0346 05/26/21 ?0626 05/26/21 ?9485 05/27/21 ?0316 05/27/21 ?4627 05/27/21 ?0350 05/28/21 ?0938  ?WBC 21.6*  --  20.7* 19.2*  --   --  14.1*  --  18.9*  ?HGB 6.5*   < > 8.7* 8.8* 9.2* 8.2* 7.6* 8.2* 8.3*  ?HCT 21.5*   < > 27.0* 27.5* 27.0* 24.0* 24.8* 24.0* 27.1*  ?MCV 93.9  --  91.8 91.7  --   --  93.6  --  94.8  ?PLT 655*  --  614* 509*  --   --  472*  --  486*  ? < > = values in this interval not displayed.  ? ? ?Basic Metabolic Panel: ?Recent Labs  ?Lab 05/24/21 ?1829 05/24/21 ?1130 05/25/21 ?0346 05/26/21 ?9371 05/26/21 ?6967 05/27/21 ?0316 05/27/21 ?8938 05/27/21 ?1017 05/28/21 ?5102  ?NA 141   < > 138 141 141 141 140 140 145  ?K 3.5   < > 4.2 4.0 4.0 4.2 4.0 3.8 3.7  ?CL 107  --  98 95*  --   --  96*  --  95*  ?CO2 29  --  31 37*  --   --  38*  --  41*  ?GLUCOSE 193*  --  148* 143*  --   --  138*  --  171*  ?BUN 7  --  11 13  --   --  18  --  16  ?CREATININE 0.50  --  0.56 0.64  --   --  0.72  --  0.68  ?CALCIUM 7.2*  --  8.2* 8.3*  --   --  8.1*  --  7.9*  ? < > = values in this interval not displayed.  ? ?GFR: ?Estimated Creatinine Clearance: 92 mL/min (by C-G formula based on SCr of 0.68 mg/dL). ?Recent Labs  ?Lab 05/25/21 ?0346 05/26/21 ?5852 05/27/21 ?7782 05/28/21 ?4235  ?WBC 20.7* 19.2* 14.1* 18.9*  ? ? ?Liver Function Tests: ?Recent Labs  ?Lab 05/24/21 ?0701 05/27/21 ?3614 05/28/21 ?4315  ?AST 32 41 85*  ?ALT 86* 50* 88*  ?ALKPHOS 274* 246* 288*  ?BILITOT 0.6 0.2* 0.5  ?PROT 6.6 6.4* 7.0  ?ALBUMIN 1.8* <1.5* 1.7*  ? ?No results for input(s): LIPASE, AMYLASE in the last 168 hours. ?No results for input(s): AMMONIA in the last 168 hours. ? ?ABG ?   ?Component Value Date/Time  ? PHART 7.365 05/27/2021 0937  ? PCO2ART 71.3 ([place]) 05/27/2021 4008  ? PO2ART 68 (L) 05/27/2021 6761  ? HCO3 40.8 (H) 05/27/2021 9509  ? TCO2 43 (H) 05/27/2021 3267  ? O2SAT 92 05/27/2021 0937  ?  ? ?Coagulation  Profile: ?No results for input(s): INR, PROTIME in the last 168 hours. ? ?Cardiac Enzymes: ?No results for input(s): CKTOTAL, CKMB, CKMBINDEX, TROPONINI in the last 168 hours. ? ?HbA1C: ?Hgb A1c MFr Bld  ?Date/Time Value Ref Range Status  ?05/15/2021 05:41 AM 6.1 (H) 4.8 - 5.6 % Final  ?  Comment:  ?  (NOTE) ?Pre diabetes:          5.7%-6.4% ? ?Diabetes:              >6.4% ? ?Glycemic control for   <7.0% ?adults with diabetes ?  ? ? ?  CBG: ?Recent Labs  ?Lab 05/27/21 ?1921 05/27/21 ?2312 05/28/21 ?8016 05/28/21 ?5537 05/28/21 ?1137  ?GLUCAP 173* 131* 154* 169* 142*  ? ? ?Review of Systems:   ?Unable to obtain, intubated and sedated ? ?Past Medical History:  ?She,  has a past medical history of Acid reflux, Allergy, Anxiety, Arthritis, ASCUS (atypical squamous cells of undetermined significance) on Pap smear (03/2011), Asthma, Cancer ([place]), Cervical dysplasia, mild (08/2010), Clotting disorder ([place]), Depression, Endometriosis, Headache(784.0), High risk HPV infection (12/2011), Hypertension, Pneumonia, Seizures ([place]), Smoker, and Von Willebrand disease.  ? ?Surgical History:  ? ?Past Surgical History:  ?Procedure Laterality Date  ? APPLICATION OF CRANIAL NAVIGATION N/A 11/24/2016  ? Procedure: APPLICATION OF CRANIAL NAVIGATION;  Surgeon: Kristeen Miss, MD;  Location: [facility];  Service: Neurosurgery;  Laterality: N/A;  ? BRAIN SURGERY    ? BREAST BIOPSY Left   ? CHOLECYSTECTOMY N/A 05/05/2021  ? Procedure: CHOLECYSTECTOMY;  Surgeon: [name], MD;  Location: [facility];  Service: General;  Laterality: N/A;  ? COLON RESECTION N/A 05/05/2021  ? Procedure: RIGHT HEMICOLECTOMY;  Surgeon: [name], MD;  Location: [facility];  Service: General;  Laterality: N/A;  ? COLPOSCOPY    ? CYSTOSCOPY N/A 10/14/2012  ? Procedure: CYSTOSCOPY;  Surgeon: [name], MD;  Location: [facility];  Service: Gynecology;  Laterality: N/A;  ? DILATION AND CURETTAGE OF UTERUS    ? HYSTEROSCOPY WITH D & C  10/14/2010  ? Procedure: DILATATION  AND CURETTAGE (D&C) /HYSTEROSCOPY;  Surgeon: [name], MD;  Location: [facility];  Service: Gynecology;  Laterality: N/A;  ? LAPAROSCOPIC HYSTERECTOMY N/A 10/14/2012  ? Procedure: HYSTERECTOMY TOTAL LAPARO

## 2021-05-28 NOTE — Progress Notes (Signed)
? ?Trauma/Critical Care Follow Up Note ? ?Subjective:  ?  ?Overnight Issues:  ? ?Objective:  ?Vital signs for last 24 hours: ?Temp:  [99 ?F (37.2 ?C)-102.3 ?F (39.1 ?C)] 100.4 ?F (38 ?C) (04/01 0800) ?Pulse Rate:  [85-121] 95 (04/01 0830) ?Resp:  [21-27] 24 (04/01 0830) ?BP: (85-152)/(41-71) 97/51 (04/01 0830) ?SpO2:  [89 %-100 %] 100 % (04/01 0854) ?Arterial Line BP: (113-190)/(49-85) 121/51 (04/01 0830) ?FiO2 (%):  [40 %-60 %] 60 % (04/01 0521) ?Weight:  [89 kg] 89 kg (04/01 0500) ? ?Hemodynamic parameters for last 24 hours: ?  ? ?Intake/Output from previous day: ?03/31 0701 - 04/01 0700 ?In: 3266.7 [I.V.:957.7; NG/GT:2249] ?Out: 4481 [Urine:4170; Emesis/NG output:1; EHUDJ:497]  ?Intake/Output this shift: ?Total I/O ?In: 83.3 [I.V.:83.3] ?Out: 245 [Urine:245] ? ?Vent settings for last 24 hours: ?Vent Mode: PRVC ?FiO2 (%):  [40 %-60 %] 60 % ?Set Rate:  [24 bmp] 24 bmp ?Vt Set:  [340 mL] 340 mL ?PEEP:  [14 cmH20-16 cmH20] 16 cmH20 ?Plateau Pressure:  [29 WYO37-85 cmH20] 42 cmH20 ? ?Physical Exam:  ?Gen: comfortable, no distress ?Neuro: sedated ?HEENT: PERRL ?Neck: supple ?CV: RRR ?Pulm: unlabored breathing ?Abd: soft, NT ?GU: clear yellow urine ?Extr: wwp, no edema ? ? ?Results for orders placed or performed during the hospital encounter of 05/05/21 (from the past 24 hour(s))  ?Prealbumin     Status: Abnormal  ? Collection Time: 05/27/21  9:23 AM  ?Result Value Ref Range  ? Prealbumin 16.0 (L) 18 - 38 mg/dL  ?I-STAT 7, (LYTES, BLD GAS, ICA, H+H)     Status: Abnormal  ? Collection Time: 05/27/21  9:37 AM  ?Result Value Ref Range  ? pH, Arterial 7.365 7.35 - 7.45  ? pCO2 arterial 71.3 (HH) 32 - 48 mmHg  ? pO2, Arterial 68 (L) 83 - 108 mmHg  ? Bicarbonate 40.8 (H) 20.0 - 28.0 mmol/L  ? TCO2 43 (H) 22 - 32 mmol/L  ? O2 Saturation 92 %  ? Acid-Base Excess 14.0 (H) 0.0 - 2.0 mmol/L  ? Sodium 140 135 - 145 mmol/L  ? Potassium 3.8 3.5 - 5.1 mmol/L  ? Calcium, Ion 1.16 1.15 - 1.40 mmol/L  ? HCT 24.0 (L) 36.0 - 46.0 %  ?  Hemoglobin 8.2 (L) 12.0 - 15.0 g/dL  ? Sample type ARTERIAL   ? Comment NOTIFIED PHYSICIAN   ?Glucose, capillary     Status: Abnormal  ? Collection Time: 05/27/21 11:23 AM  ?Result Value Ref Range  ? Glucose-Capillary 164 (H) 70 - 99 mg/dL  ?Glucose, capillary     Status: Abnormal  ? Collection Time: 05/27/21  3:25 PM  ?Result Value Ref Range  ? Glucose-Capillary 169 (H) 70 - 99 mg/dL  ?Glucose, capillary     Status: Abnormal  ? Collection Time: 05/27/21  7:21 PM  ?Result Value Ref Range  ? Glucose-Capillary 173 (H) 70 - 99 mg/dL  ?Glucose, capillary     Status: Abnormal  ? Collection Time: 05/27/21 11:12 PM  ?Result Value Ref Range  ? Glucose-Capillary 131 (H) 70 - 99 mg/dL  ?Glucose, capillary     Status: Abnormal  ? Collection Time: 05/28/21  3:15 AM  ?Result Value Ref Range  ? Glucose-Capillary 154 (H) 70 - 99 mg/dL  ?CBC     Status: Abnormal  ? Collection Time: 05/28/21  6:23 AM  ?Result Value Ref Range  ? WBC 18.9 (H) 4.0 - 10.5 K/uL  ? RBC 2.86 (L) 3.87 - 5.11 MIL/uL  ? Hemoglobin 8.3 (L) 12.0 -  15.0 g/dL  ? HCT 27.1 (L) 36.0 - 46.0 %  ? MCV 94.8 80.0 - 100.0 fL  ? MCH 29.0 26.0 - 34.0 pg  ? MCHC 30.6 30.0 - 36.0 g/dL  ? RDW 14.4 11.5 - 15.5 %  ? Platelets 486 (H) 150 - 400 K/uL  ? nRBC 0.0 0.0 - 0.2 %  ?Comprehensive metabolic panel     Status: Abnormal  ? Collection Time: 05/28/21  6:23 AM  ?Result Value Ref Range  ? Sodium 145 135 - 145 mmol/L  ? Potassium 3.7 3.5 - 5.1 mmol/L  ? Chloride 95 (L) 98 - 111 mmol/L  ? CO2 41 (H) 22 - 32 mmol/L  ? Glucose, Bld 171 (H) 70 - 99 mg/dL  ? BUN 16 6 - 20 mg/dL  ? Creatinine, Ser 0.68 0.44 - 1.00 mg/dL  ? Calcium 7.9 (L) 8.9 - 10.3 mg/dL  ? Total Protein 7.0 6.5 - 8.1 g/dL  ? Albumin 1.7 (L) 3.5 - 5.0 g/dL  ? AST 85 (H) 15 - 41 U/L  ? ALT 88 (H) 0 - 44 U/L  ? Alkaline Phosphatase 288 (H) 38 - 126 U/L  ? Total Bilirubin 0.5 0.3 - 1.2 mg/dL  ? GFR, Estimated >60 >60 mL/min  ? Anion gap 9 5 - 15  ?Glucose, capillary     Status: Abnormal  ? Collection Time: 05/28/21  7:28  AM  ?Result Value Ref Range  ? Glucose-Capillary 169 (H) 70 - 99 mg/dL  ? ? ?Assessment & Plan: ? ?Present on Admission: ? Neuroendocrine carcinoma ([place]) ? ? ? LOS: 23 days  ? ?Additional comments:I reviewed the patient's new clinical lab test results.   and I reviewed the patients new imaging test results.   ? ?Neuroendocrine tumor on colonoscopy - s/p exlap, R hemicolectomy, cholecystectomy 3/9. Well differentiated neuroendocrine tumor with 1/7 +nodes. D/w Dr. Marin Olp 3/15 will likely need adjuvant chemo. Staples out 3/24. Bottom 2cm of wound packed. ?Respiratory failure 2/2 volume overload/?PNA - CT chest 3/11 and 3/13 with no PE. Intubated 3/16, suspect PNA based on CXR but negative resp cx and BAL, s/p 7d empiric merrem. Decompensation 3/26, likely will need trach when settings lower, d/w husband. Discussed this patient with pulomology 3/29 AM, recommend continuing current care, no abx unless organism identified.  ?Volume overload - previously resolved, but 3/24 PM, went into flash pulmonary edema. Cont lasix gtt to 8 due to sedation causing increased intake. Incr valium to help decr drip reqts. Echo 3/28, essentially normal.  ?Shock - resolved, levo available to augment diuresis. Currently off. PICC 3/27 ?Altered mental status - CT head 3/13, 3/16/ 3/17 normal (h/o VP shunt). Changed cefepime to zosyn 3/13, total 7d course-end today, empiric coverage for PNA. Resp cx today. Ucx 3/13 negative. Bcx 3/15 NGTD. LP 3/16 NGTD. CT A/P 3/16 negative. 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 Neurology on board, changed to keppra, cont EEG without sz, encephalopathy much improved  ?Diarrhea - d/c'd imodium to encourage NH4 loss, restarted 3/25, incr to TID, cont cholestyramine TID, added lomotil ?vWD - humate P per Heme, end 3/12, stable ?Transaminitis - likely 2/2 TPN, now d/c'd, recheck 4/3 ?Leukocytosis, fever - 19 this AM, infectious workup unrevealing, curbside ID consult 3/28 with no add'l recs,  LE duplex negative 3/31 ?FEN - NPO, OGT, tol TF at goal, PA 16, d/w nutrition with RD ?ABLA - stable ?DVT - SCDs, s/p DDVAP, LMWH, LE duplex neg for DVT 3/13 ?Foley - replaced 3/18, d/c'd 3/24, replaced 3/25,  cont for accurate I/O on lasix gtt ?Dispo - ICU   ?  ? ?Critical Care Total Time: 40 minutes ? ?[name], MD ?Trauma & General Surgery ?Please use [email or web address] to contact on call provider ? ?05/28/2021 ? ?*Care during the described time interval was provided by me. I have reviewed this patient's available data, including medical history, events of note, physical examination and test results as part of my evaluation. ? ? ? ?

## 2021-05-28 NOTE — Procedures (Signed)
Bronchoscopy Procedure Note ? ?Cassidy Tran  ?[id number]  ?Jul 10, 1975 ? ?Date:05/28/21  ?Time:2:52 PM  ? ?Provider Performing:Nyelah Emmerich Merlene Pulling  ? ?Procedure(s):  Flexible bronchoscopy with bronchial alveolar lavage (69794) ? ?Indication(s) ?Pneumonitis ? ?Consent ?Risks of the procedure as well as the alternatives and risks of each were explained to the patient and/or caregiver.  Consent for the procedure was obtained and is signed in the bedside chart ? ?Anesthesia ?None ? ? ?Time Out ?Verified patient identification, verified procedure, site/side was marked, verified correct patient position, special equipment/implants available, medications/allergies/relevant history reviewed, required imaging and test results available. ? ? ?Sterile Technique ?Usual hand hygiene, masks, gowns, and gloves were used ? ? ?Procedure Description ?Bronchoscope advanced through endotracheal tube and into airway.  Airways were examined down to subsegmental level with findings noted below.   ?Following diagnostic evaluation, BAL performed in RUL with 130cc saline instilled and 25cc cloudy return.  ? ?Findings:  ?- Edematous airways throughout with thin secretions which were suctioned ? ? ?Complications/Tolerance ?None; patient tolerated the procedure well. ?Chest X-ray is not needed post procedure. ? ? ?EBL ?Minimal ? ? ?Specimen(s) ?BAL sent for micro, cell count/diff, cytology  ?

## 2021-05-28 NOTE — Progress Notes (Addendum)
Pt was no longer synchronous with the vent after cleaning her up. PRN vecuronium dose given, 23mg fentanyl bolus x2, 2 mg versed bolus given. SBP in 200s, HR 120s, peak pressuring on vent. Trauma MD WDonne Hazelpaged. Verbal orders for CXR, additional dose of vec, '3mg'$  bolus of versed, continuous versed gtt back on and increase sedation for vent synchrony.  ? ?Currently Fentanyl is at 150, precedex is at 1, versed at 6. See eMAR. BP in 180s, tachycardic in 110 and still peak pressure on vent. ? ?RN will continue to monitor   ?

## 2021-05-28 NOTE — Progress Notes (Signed)
Nutrition Follow-up ? ?DOCUMENTATION CODES:  ? ?Obesity unspecified ? ?INTERVENTION:  ? ?Continue tube feeding via OG tube (tip post-pyloric): ?- Increase Vital 1.5 to new goal rate of 65 ml/hr (1560 ml/day) ?- ProSource TF 45 ml daily ? ?Tube feeding regimen provides 2380 kcal, 116 grams of protein, and 1192 ml of H2O.  ? ?NUTRITION DIAGNOSIS:  ? ?Increased nutrient needs related to acute illness as evidenced by estimated needs. ? ?Ongoing, being addressed via TF ? ?GOAL:  ? ?Patient will meet greater than or equal to 90% of their needs ? ?Met via TF ? ?MONITOR:  ? ?Vent status, Weight trends, TF tolerance, I & O's ? ?REASON FOR ASSESSMENT:  ? ?Other (Cortrak) ?  ? ?ASSESSMENT:  ? 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 PMH significant for type IA von Willebrand disease. ? ?03/09 - s/p ex-lap, R hemicolectomy, cholecystectomy for well differentiated neuroendocrine turmor with 1/7 + nodes ?03/15 - s/p Cortrak placement (tip gastric), started TF, Cortrak removed later that night ?03/16 - intubated, OG tube placed  ?03/17 - developed emesis, TF held ?03/18 - TPN initiated ?03/20 - TF resumed and advanced to goal ? ?RD working remotely. Dr. Bobbye Morton reached out to RD with concern regarding underfeeding and asking if metabolic cart capabilities were available at this time. Suspect concern for underfeeding is related to recent prealbumin lab of 16.0. Prealbumin is strongly affected by stress response and inflammatory process so may not see an improvement in this lab value even with increased tube feeding. ? ?Pt tolerating current tube feeding regimen without issue. RD to increase tube feeding regimen to provide additional kcal and protein due to concern for underfeeding. ? ?Per most recent abdominal x-ray on 3/25, OG tube side port overlies distal stomach with tip likely overlying descending duodenum. ? ?Admit wight: 93.3 kg ?Current weight: 89 kg ? ?Pt with an overall weight loss of 4.3 kg since  05/05/21. This is a 4.6% weight loss in less than 1 month which is significant for timeframe. Suspect weight changes may be related in part to fluid status. Will continue to monitor trends. ? ?Current TF: Vital 1.5 @ 55 ml/hr, ProSource TF 45 ml BID which provides 2060 kcal, 111 grams of protein, and 1008 ml of free H2O ? ?Patient is currently intubated on ventilator support ?MV: 8.4 L/min ?Temp (24hrs), Avg:101.4 ?F (38.6 ?C), Min:100.4 ?F (38 ?C), Max:102.3 ?F (39.1 ?C) ? ?Drips: ?Precedex ?Fentanyl ?Versed ?Lasix ? ?Medications reviewed and include: questran 4 grams TID, SSI q 4 hours, imodium 4 mg TID, protonix ? ?Labs reviewed: elevated LFTs, WBC 18.9, hemoglobin 8.3 ?CBG's: 131-173 x 24 hours ? ?UOP: 4170 ml x 24 hours ?Rectal tube: 600 ml x 24 hours ?I/O's: +12.9 L since admit ? ?Diet Order:   ?Diet Order   ? ?       ?  Diet NPO time specified  Diet effective now       ?  ? ?  ?  ? ?  ? ? ?EDUCATION NEEDS:  ? ?Education needs have been addressed ? ?Skin:  Skin Assessment: ?Skin Integrity Issues: ?Incisions: abdomen (closed) ?Other: MASD anus ? ?Last BM:  05/28/21 600 ml x 24 hours via rectal tube ? ?Height:  ? ?Ht Readings from Last 1 Encounters:  ?05/12/21 '5\' 3"'  (1.6 m)  ? ? ?Weight:  ? ?Wt Readings from Last 1 Encounters:  ?05/28/21 89 kg  ? ? ?Ideal Body Weight:  52.3 kg ? ?BMI:  Body  mass index is 34.76 kg/m?. ? ?Estimated Nutritional Needs:  ? ?Kcal:  2200-2400 ? ?Protein:  110-130 grams ? ?Fluid:  >/= 2.0 L ? ? ? ?Gustavus Bryant, MS, RD, LDN ?Inpatient Clinical Dietitian ?Please see AMiON for contact information. ? ?

## 2021-05-28 NOTE — Progress Notes (Addendum)
RT NOTE: ? ?39  ? ?RT at bedside following bath due to increased peak pressures of 48 and decreased volumes 256ms. Spo2 88%. Scheduled albuterol given. Rhonchi/Crackles bilaterally. Pt taken off vent and lavaged/bagged with moderate, thick secretions removed. RN adjusted sedation and gave PRN Vec. No change with the above. Trauma MD made aware. No changes ordered from Resp standpoint. Xray ordered.  ? ?0520 am  ? ?Vent/ballard/HME/filters all changed out to rule out equipment issue. Pt  synchronous with vent after additional Vec dose given, however, peak pressures still remain in mid 40's, plat 39. PRN Albuterol given. MD aware.  ?

## 2021-05-29 DIAGNOSIS — C7A8 Other malignant neuroendocrine tumors: Secondary | ICD-10-CM | POA: Diagnosis not present

## 2021-05-29 LAB — CBC
HCT: 25.5 % — ABNORMAL LOW (ref 36.0–46.0)
Hemoglobin: 7.7 g/dL — ABNORMAL LOW (ref 12.0–15.0)
MCH: 28.4 pg (ref 26.0–34.0)
MCHC: 30.2 g/dL (ref 30.0–36.0)
MCV: 94.1 fL (ref 80.0–100.0)
Platelets: 424 10*3/uL — ABNORMAL HIGH (ref 150–400)
RBC: 2.71 MIL/uL — ABNORMAL LOW (ref 3.87–5.11)
RDW: 13.9 % (ref 11.5–15.5)
WBC: 13.9 10*3/uL — ABNORMAL HIGH (ref 4.0–10.5)
nRBC: 0 % (ref 0.0–0.2)

## 2021-05-29 LAB — COMPREHENSIVE METABOLIC PANEL
ALT: 90 U/L — ABNORMAL HIGH (ref 0–44)
AST: 72 U/L — ABNORMAL HIGH (ref 15–41)
Albumin: 1.7 g/dL — ABNORMAL LOW (ref 3.5–5.0)
Alkaline Phosphatase: 257 U/L — ABNORMAL HIGH (ref 38–126)
Anion gap: 13 (ref 5–15)
BUN: 16 mg/dL (ref 6–20)
CO2: 44 mmol/L — ABNORMAL HIGH (ref 22–32)
Calcium: 8.2 mg/dL — ABNORMAL LOW (ref 8.9–10.3)
Chloride: 90 mmol/L — ABNORMAL LOW (ref 98–111)
Creatinine, Ser: 0.6 mg/dL (ref 0.44–1.00)
GFR, Estimated: >60 mL/min (ref >60)
Glucose, Bld: 157 mg/dL — ABNORMAL HIGH (ref 70–99)
Potassium: 3.1 mmol/L — ABNORMAL LOW (ref 3.5–5.1)
Sodium: 147 mmol/L — ABNORMAL HIGH (ref 135–145)
Total Bilirubin: 0.3 mg/dL (ref 0.3–1.2)
Total Protein: 6.5 g/dL (ref 6.5–8.1)

## 2021-05-29 LAB — POCT I-STAT 7, (LYTES, BLD GAS, ICA,H+H)
Acid-Base Excess: 22 mmol/L — ABNORMAL HIGH (ref 0.0–2.0)
Bicarbonate: 49 mmol/L — ABNORMAL HIGH (ref 20.0–28.0)
Calcium, Ion: 1.08 mmol/L — ABNORMAL LOW (ref 1.15–1.40)
HCT: 24 % — ABNORMAL LOW (ref 36.0–46.0)
Hemoglobin: 8.2 g/dL — ABNORMAL LOW (ref 12.0–15.0)
O2 Saturation: 89 %
Patient temperature: 99.6
Potassium: 3.1 mmol/L — ABNORMAL LOW (ref 3.5–5.1)
Sodium: 143 mmol/L (ref 135–145)
TCO2: >50 mmol/L — ABNORMAL HIGH (ref 22–32)
pCO2 arterial: 74.3 mmHg (ref 32–48)
pH, Arterial: 7.43 (ref 7.35–7.45)
pO2, Arterial: 60 mmHg — ABNORMAL LOW (ref 83–108)

## 2021-05-29 LAB — GLUCOSE, CAPILLARY
Glucose-Capillary: 138 mg/dL — ABNORMAL HIGH (ref 70–99)
Glucose-Capillary: 148 mg/dL — ABNORMAL HIGH (ref 70–99)
Glucose-Capillary: 142 mg/dL — ABNORMAL HIGH (ref 70–99)
Glucose-Capillary: 120 mg/dL — ABNORMAL HIGH (ref 70–99)
Glucose-Capillary: 127 mg/dL — ABNORMAL HIGH (ref 70–99)
Glucose-Capillary: 118 mg/dL — ABNORMAL HIGH (ref 70–99)

## 2021-05-29 MED ORDER — BUDESONIDE 0.25 MG/2ML IN SUSP
0.2500 mg | Freq: Two times a day (BID) | RESPIRATORY_TRACT | Status: DC
  Administered 2021-05-29 – 2021-06-15 (×35): 0.25 mg via RESPIRATORY_TRACT
  Filled 2021-05-29 (×35): qty 2

## 2021-05-29 MED ORDER — POTASSIUM CHLORIDE 20 MEQ PO PACK
20.0000 meq | PACK | ORAL | Status: AC
  Administered 2021-05-29 (×2): 20 meq
  Filled 2021-05-29 (×2): qty 1

## 2021-05-29 MED ORDER — POTASSIUM CHLORIDE 10 MEQ/50ML IV SOLN
10.0000 meq | INTRAVENOUS | Status: AC
  Administered 2021-05-29 (×4): 10 meq via INTRAVENOUS
  Filled 2021-05-29 (×4): qty 50

## 2021-05-29 MED ORDER — METOLAZONE 5 MG PO TABS
5.0000 mg | ORAL_TABLET | Freq: Once | ORAL | Status: AC
  Administered 2021-05-29: 5 mg
  Filled 2021-05-29: qty 1

## 2021-05-29 NOTE — Progress Notes (Signed)
[facility] ADULT ICU REPLACEMENT PROTOCOL ? ? ?The patient does apply for the [facility] Adult ICU Electrolyte Replacment Protocol based on the criteria listed below:  ? ?1.Exclusion criteria: TCTS patients, ECMO patients, and Dialysis patients ?2. Is GFR >/= 30 ml/min? Yes.    ?Patient's GFR today is > 60 ?3. Is SCr </= 2? Yes.   ?Patient's SCr is 0.60 mg/dL ?4. Did SCr increase >/= 0.5 in 24 hours? No. ?5.Pt's weight >40kg  Yes.   ?6. Abnormal electrolyte(s): K+ 3.1  ?7. Electrolytes replaced per protocol ?8.  Call MD STAT for K+ </= 2.5, Phos </= 1, or Mag </= 1 ?Physician:  Dr Burnard Leigh ? ? ? ? ?Cassidy Tran 05/29/2021 7:24 AM  ?

## 2021-05-29 NOTE — Consult Note (Signed)
? ?NAME:  Cassidy Tran, MRN:  [id number], DOB:  1975/03/26, LOS: 24 ?ADMISSION DATE:  05/05/2021, CONSULTATION DATE:  05/28/21 ?REFERRING MD:  Bobbye Morton, CHIEF COMPLAINT:  acute hypoxic respiratory failure ? 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 Course has been complicated by acute encephalopathy possibly related to hyperammonemia from depakote, ARDS with intubation on 3/16 with incremental improvement for which we have been consulted.  ? ?Pertinent  Medical History  ?Asthma ?Smoking  ?Obstructive and likely restrictive lung disease ?Epilepsy ?Chiari malformation s/p VPS ?VWF disease ?Biliary dyskinesia ?NET ? ?Significant Hospital Events: ?Including procedures, antibiotic start and stop dates in addition to other pertinent events   ?3/9 ex lap, R hemicolectomy, chole, staples removed 3/24 ?3/12 end date for humate P ?3/13-3/16 concern for AMS due to hyperammonemia ?3/16 intubation ? ?Interim History / Subjective:  ?Weaned off of versed completely. Saturation stable. Thin secretions in ballard. Net negative 1L.  ? ?Objective   ?Blood pressure (!) 113/48, pulse 91, temperature 99.6 ?F (37.6 ?C), temperature source Axillary, resp. rate 20, height _0  (1.6 m), weight 89.2 kg, last menstrual period 09/16/2012, SpO2 100 %. ?   ?Vent Mode: PRVC ?FiO2 (%):  [50 %-60 %] 50 % ?Set Rate:  [20 bmp-24 bmp] 20 bmp ?Vt Set:  [340 mL-420 mL] 420 mL ?PEEP:  [12 cmH20] 12 cmH20 ?Plateau Pressure:  [28 [id number] cmH20] 29 cmH20  ? ?Intake/Output Summary (Last 24 hours) at 05/29/2021 0835 ?Last data filed at 05/29/2021 0800 ?Gross per 24 hour  ?Intake 2599.8 ml  ?Output 3300 ml  ?Net -700.2 ml  ? ?Filed Weights  ? 05/24/21 0423  05/28/21 0500 05/29/21 0500  ?Weight: 87.3 kg 89 kg 89.2 kg  ? ? ?Examination: ?General appearance: 46 y.o., female, intubated ?Eyes: anicteric sclerae; PERRL, tracking appropriately ?Lungs: couple inspiratory squeaks, with normal respiratory effort ?CV: RRR, no murmur  ?Abdomen: Soft, non-tender; non-distended, BS present  ?Extremities: 1+ dependent edema, warm ?Skin: Normal turgor and texture; no rash ?Neuro: rass 0 to -1, follows commands ? ?Labs/imaging reviewed ? ? ?Resolved Hospital Problem list   ?N/a ? ?Assessment & Plan:  ? ?# Acute hypoxic respiratory failure: ?Most recent CT Chest with crazy paving pattern. If this is just ARDS then is relatively late in course (>14d) where steroids aren't generally as helpful and could be harmful. Most likely ARDS (with many potential etiologies) with some volume overload but other considerations include organizing pneumonia (paucicellular but lymphocytic diff on BAL 4/1), pulmonary alveolar proteinosis (awaiting BAL cytology) which could be primary or secondary but this is super rare. Regardless she already seems to be getting better with aggressive diuresis and current supportive measures.  ?- f/u bronch/BAL today, sent for cell count/diff, micro, cyto ?- continue to diurese for net negative fluid balance, I added metolazone as she is developing hypernatremia ?- Although her BAL diff lends support to trial of steroids for organizing pneumonia, I'd like to hold off on potentially committing her to a long course of steroids since she seems to be improving without them. If we're not making headway toward vent liberation in the next day or so then we could consider starting prednisone 47m/kg with plan for slow taper.  ?- bronchodilators - can trial brovana/yupelri/pulmicort given  her history of obstructive lung disease although she doesn't have elevated Ppeak - Pplat  ?- LTVV, PAD protocol, VAP bundle  ?- agree with attempts to lighten sedation as able targeting rass -1 to  -2 unless again develops dyssynchrony leading to desaturation or hemodynamic instability ?- we can also consider perc trach on pretty much current settings if sedation/dyssynchrony continue to limit attempts at vent liberation ? ? ?Best Practice (right click and "Reselect all SmartList Selections" daily)  ? ?Per primary ? ?Critical care time: 32 minutes ?  ? ? ? ?  ?

## 2021-05-29 NOTE — Progress Notes (Signed)
? ?Trauma/Critical Care Follow Up Note ? ?Subjective:  ?  ?Overnight Issues:  ? ?Objective:  ?Vital signs for last 24 hours: ?Temp:  [98.5 ?F (36.9 ?C)-101.3 ?F (38.5 ?C)] 100.2 ?F (37.9 ?C) (04/02 0800) ?Pulse Rate:  [83-113] 91 (04/02 0800) ?Resp:  [15-32] 20 (04/02 0800) ?BP: (89-133)/(43-77) 113/48 (04/02 0800) ?SpO2:  [95 %-100 %] 100 % (04/02 0800) ?Arterial Line BP: (104-162)/(47-68) 135/58 (04/02 0800) ?FiO2 (%):  [50 %-60 %] 50 % (04/02 0450) ?Weight:  [89.2 kg] 89.2 kg (04/02 0500) ? ?Hemodynamic parameters for last 24 hours: ?  ? ?Intake/Output from previous day: ?04/01 0701 - 04/02 0700 ?In: 2456.7 [I.V.:1029.3; NG/GT:1427.4] ?Out: 8264 [Urine:3245; BRAXE:940]  ?Intake/Output this shift: ?Total I/O ?In: 226.4 [I.V.:87.6; NG/GT:130; IV Piggyback:8.8] ?Out: -  ? ?Vent settings for last 24 hours: ?Vent Mode: PRVC ?FiO2 (%):  [50 %-60 %] 50 % ?Set Rate:  [20 bmp-24 bmp] 20 bmp ?Vt Set:  [340 mL-420 mL] 420 mL ?PEEP:  [12 cmH20] 12 cmH20 ?Plateau Pressure:  [28 HWK08-81 cmH20] 29 cmH20 ? ?Physical Exam:  ?Gen: comfortable, no distress ?Neuro: f/c ?HEENT: PERRL ?Neck: supple ?CV: RRR ?Pulm: unlabored breathing on 40% and 10 ?Abd: soft, NT ?GU: clear yellow urine ?Extr: wwp, no edema ? ? ?Results for orders placed or performed during the hospital encounter of 05/05/21 (from the past 24 hour(s))  ?Glucose, capillary     Status: Abnormal  ? Collection Time: 05/28/21 11:37 AM  ?Result Value Ref Range  ? Glucose-Capillary 142 (H) 70 - 99 mg/dL  ?Body fluid cell count with differential     Status: Abnormal  ? Collection Time: 05/28/21  2:47 PM  ?Result Value Ref Range  ? Fluid Type-FCT BAL   ? Color, Fluid PINK (A) YELLOW  ? Appearance, Fluid HAZY (A) CLEAR  ? Total Nucleated Cell Count, Fluid 35 0 - 1,000 cu mm  ? Neutrophil Count, Fluid 48 (H) 0 - 25 %  ? Lymphs, Fluid 45 %  ? Monocyte-Macrophage-Serous Fluid 7 (L) 50 - 90 %  ? Eos, Fluid 0 %  ? Other Cells, Fluid LINING CELLS NOTED ON SMEAR REVIEW %  ?Culture,  Respiratory w Gram Stain     Status: None (Preliminary result)  ? Collection Time: 05/28/21  2:48 PM  ? Specimen: Bronchoalveolar Lavage; Respiratory  ?Result Value Ref Range  ? Specimen Description BRONCHIAL ALVEOLAR LAVAGE   ? Special Requests NONE   ? Gram Stain    ?  WBC PRESENT,BOTH PMN AND MONONUCLEAR ?GRAM POSITIVE COCCI ?CYTOSPIN SMEAR ?Performed at [facility], [place] [address], [place], [place] [address] ?  ? Culture PENDING   ? Report Status PENDING   ?Glucose, capillary     Status: Abnormal  ? Collection Time: 05/28/21  4:05 PM  ?Result Value Ref Range  ? Glucose-Capillary 165 (H) 70 - 99 mg/dL  ?I-STAT 7, (LYTES, BLD GAS, ICA, H+H)     Status: Abnormal  ? Collection Time: 05/28/21  7:32 PM  ?Result Value Ref Range  ? pH, Arterial 7.420 7.35 - 7.45  ? pCO2 arterial 70.2 (HH) 32 - 48 mmHg  ? pO2, Arterial 62 (L) 83 - 108 mmHg  ? Bicarbonate 45.5 (H) 20.0 - 28.0 mmol/L  ? TCO2 48 (H) 22 - 32 mmol/L  ? O2 Saturation 91 %  ? Acid-Base Excess 19.0 (H) 0.0 - 2.0 mmol/L  ? Sodium 141 135 - 145 mmol/L  ? Potassium 3.3 (L) 3.5 - 5.1 mmol/L  ? Calcium, Ion  1.07 (L) 1.15 - 1.40 mmol/L  ? HCT 22.0 (L) 36.0 - 46.0 %  ? Hemoglobin 7.5 (L) 12.0 - 15.0 g/dL  ? Patient temperature 98.5 F   ? Collection site art line   ? Drawn by Operator   ? Sample type ARTERIAL   ? Comment NOTIFIED PHYSICIAN   ?Glucose, capillary     Status: Abnormal  ? Collection Time: 05/28/21  7:47 PM  ?Result Value Ref Range  ? Glucose-Capillary 176 (H) 70 - 99 mg/dL  ? Comment 1 Notify RN   ? Comment 2 Document in Chart   ?Glucose, capillary     Status: Abnormal  ? Collection Time: 05/28/21 11:33 PM  ?Result Value Ref Range  ? Glucose-Capillary 157 (H) 70 - 99 mg/dL  ? Comment 1 Notify RN   ? Comment 2 Document in Chart   ?Glucose, capillary     Status: Abnormal  ? Collection Time: 05/29/21  4:11 AM  ?Result Value Ref Range  ? Glucose-Capillary 138 (H) 70 - 99 mg/dL  ? Comment 1 Notify RN   ? Comment 2 Document in Chart   ?I-STAT 7, (LYTES,  BLD GAS, ICA, H+H)     Status: Abnormal  ? Collection Time: 05/29/21  5:17 AM  ?Result Value Ref Range  ? pH, Arterial 7.430 7.35 - 7.45  ? pCO2 arterial 74.3 (HH) 32 - 48 mmHg  ? pO2, Arterial 60 (L) 83 - 108 mmHg  ? Bicarbonate 49.0 (H) 20.0 - 28.0 mmol/L  ? TCO2 >50 (H) 22 - 32 mmol/L  ? O2 Saturation 89 %  ? Acid-Base Excess 22.0 (H) 0.0 - 2.0 mmol/L  ? Sodium 143 135 - 145 mmol/L  ? Potassium 3.1 (L) 3.5 - 5.1 mmol/L  ? Calcium, Ion 1.08 (L) 1.15 - 1.40 mmol/L  ? HCT 24.0 (L) 36.0 - 46.0 %  ? Hemoglobin 8.2 (L) 12.0 - 15.0 g/dL  ? Patient temperature 99.6 F   ? Collection site art line   ? Drawn by Operator   ? Sample type ARTERIAL   ? Comment NOTIFIED PHYSICIAN   ?CBC     Status: Abnormal  ? Collection Time: 05/29/21  5:58 AM  ?Result Value Ref Range  ? WBC 13.9 (H) 4.0 - 10.5 K/uL  ? RBC 2.71 (L) 3.87 - 5.11 MIL/uL  ? Hemoglobin 7.7 (L) 12.0 - 15.0 g/dL  ? HCT 25.5 (L) 36.0 - 46.0 %  ? MCV 94.1 80.0 - 100.0 fL  ? MCH 28.4 26.0 - 34.0 pg  ? MCHC 30.2 30.0 - 36.0 g/dL  ? RDW 13.9 11.5 - 15.5 %  ? Platelets 424 (H) 150 - 400 K/uL  ? nRBC 0.0 0.0 - 0.2 %  ?Comprehensive metabolic panel     Status: Abnormal  ? Collection Time: 05/29/21  5:58 AM  ?Result Value Ref Range  ? Sodium 147 (H) 135 - 145 mmol/L  ? Potassium 3.1 (L) 3.5 - 5.1 mmol/L  ? Chloride 90 (L) 98 - 111 mmol/L  ? CO2 44 (H) 22 - 32 mmol/L  ? Glucose, Bld 157 (H) 70 - 99 mg/dL  ? BUN 16 6 - 20 mg/dL  ? Creatinine, Ser 0.60 0.44 - 1.00 mg/dL  ? Calcium 8.2 (L) 8.9 - 10.3 mg/dL  ? Total Protein 6.5 6.5 - 8.1 g/dL  ? Albumin 1.7 (L) 3.5 - 5.0 g/dL  ? AST 72 (H) 15 - 41 U/L  ? ALT 90 (H) 0 - 44 U/L  ? Alkaline  Phosphatase 257 (H) 38 - 126 U/L  ? Total Bilirubin 0.3 0.3 - 1.2 mg/dL  ? GFR, Estimated >60 >60 mL/min  ? Anion gap 13 5 - 15  ?Glucose, capillary     Status: Abnormal  ? Collection Time: 05/29/21  7:46 AM  ?Result Value Ref Range  ? Glucose-Capillary 148 (H) 70 - 99 mg/dL  ? ? ?Assessment & Plan: ?The plan of care was discussed with the bedside  nurse for the day, Trish, who is in agreement with this plan and no additional concerns were raised.  ? ?Present on Admission: ? Neuroendocrine carcinoma ([place]) ? ? ? LOS: 24 days  ? ?Additional comments:I reviewed the patient's new clinical lab test results.   and I reviewed the patients new imaging test results.   ? ?Neuroendocrine tumor on colonoscopy - s/p exlap, R hemicolectomy, cholecystectomy 3/9. Well differentiated neuroendocrine tumor with 1/7 +nodes. D/w Dr. Marin Olp 3/15 will likely need adjuvant chemo. Staples out 3/24. Bottom 2cm of wound packed. ?Respiratory failure 2/2 volume overload/?PNA - CT chest 3/11 and 3/13 with no PE. Intubated 3/16, suspect PNA based on CXR but negative resp cx and BAL, s/p 7d empiric merrem. Decompensation 3/26, likely will need trach when settings lower, d/w husband. Discussed this patient with pulomology 3/29 AM, recommend continuing current care, no abx unless organism identified. Repeat pulm c/s yest, brovana, yupelri, singulair added, holding off on systemic steroids given improvement over the last couple of days. Plan for trach in the next day or two.  ?Volume overload - previously resolved, but 3/24 PM, went into flash pulmonary edema. Cont lasix gtt to 8 due to sedation causing increased intake. Incr valium to help decr drip reqts. Echo 3/28, essentially normal.  ?Shock - resolved, levo available to augment diuresis. Currently off. PICC 3/27 ?Altered mental status - CT head 3/13, 3/16/ 3/17 normal (h/o VP shunt). Changed cefepime to zosyn 3/13, total 7d course-end today, empiric coverage for PNA. Resp cx today. Ucx 3/13 negative. Bcx 3/15 NGTD. LP 3/16 NGTD. CT A/P 3/16 negative. 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 Neurology on board, changed to keppra, cont EEG without sz, encephalopathy much improved  ?Diarrhea - d/c'd imodium to encourage NH4 loss, restarted 3/25, incr to TID, cont cholestyramine TID, added lomotil ?vWD - humate P per Heme,  end 3/12, stable ?Transaminitis - likely 2/2 TPN, now d/c'd, recheck 4/3 ?Leukocytosis, fever - 14 this AM, infectious workup unrevealing, curbside ID consult 3/28 with no add'l recs, LE duplex negative

## 2021-05-29 NOTE — Progress Notes (Signed)
Patient with large amount of emesis X1. Tube feeding stopped, Zofran given. Dr. Bobbye Morton at bedside and aware, will restart tube feeds tomorrow.  ?

## 2021-05-30 ENCOUNTER — Encounter (HOSPITAL_COMMUNITY)
Admission: RE | Disposition: A | Discharge status: Rehabilitation Facility | Payer: Self-pay | Source: Home / Self Care | Attending: Surgery

## 2021-05-30 ENCOUNTER — Inpatient Hospital Stay (HOSPITAL_COMMUNITY): Payer: No Typology Code available for payment source | Admitting: Anesthesiology

## 2021-05-30 ENCOUNTER — Inpatient Hospital Stay (HOSPITAL_COMMUNITY): Payer: No Typology Code available for payment source

## 2021-05-30 DIAGNOSIS — D649 Anemia, unspecified: Secondary | ICD-10-CM

## 2021-05-30 DIAGNOSIS — I10 Essential (primary) hypertension: Secondary | ICD-10-CM

## 2021-05-30 DIAGNOSIS — J9601 Acute respiratory failure with hypoxia: Secondary | ICD-10-CM | POA: Diagnosis not present

## 2021-05-30 DIAGNOSIS — R1310 Dysphagia, unspecified: Secondary | ICD-10-CM

## 2021-05-30 DIAGNOSIS — Z9911 Dependence on respirator [ventilator] status: Secondary | ICD-10-CM

## 2021-05-30 HISTORY — PX: TRACHEOSTOMY TUBE PLACEMENT: SHX814

## 2021-05-30 HISTORY — PX: PEG PLACEMENT: SHX5437

## 2021-05-30 LAB — GLUCOSE, CAPILLARY
Glucose-Capillary: 128 mg/dL — ABNORMAL HIGH (ref 70–99)
Glucose-Capillary: 146 mg/dL — ABNORMAL HIGH (ref 70–99)
Glucose-Capillary: 133 mg/dL — ABNORMAL HIGH (ref 70–99)
Glucose-Capillary: 128 mg/dL — ABNORMAL HIGH (ref 70–99)
Glucose-Capillary: 121 mg/dL — ABNORMAL HIGH (ref 70–99)

## 2021-05-30 LAB — CYTOLOGY - NON PAP

## 2021-05-30 LAB — BASIC METABOLIC PANEL
BUN: 22 mg/dL — ABNORMAL HIGH (ref 6–20)
CO2: >45 mmol/L — ABNORMAL HIGH (ref 22–32)
Calcium: 8.4 mg/dL — ABNORMAL LOW (ref 8.9–10.3)
Chloride: 82 mmol/L — ABNORMAL LOW (ref 98–111)
Creatinine, Ser: 0.64 mg/dL (ref 0.44–1.00)
GFR, Estimated: >60 mL/min (ref >60)
Glucose, Bld: 130 mg/dL — ABNORMAL HIGH (ref 70–99)
Potassium: 3.4 mmol/L — ABNORMAL LOW (ref 3.5–5.1)
Sodium: 138 mmol/L (ref 135–145)

## 2021-05-30 LAB — COMPREHENSIVE METABOLIC PANEL
ALT: 91 U/L — ABNORMAL HIGH (ref 0–44)
AST: 64 U/L — ABNORMAL HIGH (ref 15–41)
Albumin: 2 g/dL — ABNORMAL LOW (ref 3.5–5.0)
Alkaline Phosphatase: 280 U/L — ABNORMAL HIGH (ref 38–126)
BUN: 22 mg/dL — ABNORMAL HIGH (ref 6–20)
CO2: >45 mmol/L — ABNORMAL HIGH (ref 22–32)
Calcium: 8.9 mg/dL (ref 8.9–10.3)
Chloride: 80 mmol/L — ABNORMAL LOW (ref 98–111)
Creatinine, Ser: 0.7 mg/dL (ref 0.44–1.00)
GFR, Estimated: >60 mL/min (ref >60)
Glucose, Bld: 135 mg/dL — ABNORMAL HIGH (ref 70–99)
Potassium: 2.7 mmol/L — CL (ref 3.5–5.1)
Sodium: 139 mmol/L (ref 135–145)
Total Bilirubin: 0.3 mg/dL (ref 0.3–1.2)
Total Protein: 7 g/dL (ref 6.5–8.1)

## 2021-05-30 LAB — POCT I-STAT, CHEM 8
BUN: 24 mg/dL — ABNORMAL HIGH (ref 6–20)
Calcium, Ion: 1.04 mmol/L — ABNORMAL LOW (ref 1.15–1.40)
Chloride: 78 mmol/L — ABNORMAL LOW (ref 98–111)
Creatinine, Ser: 0.9 mg/dL (ref 0.44–1.00)
Glucose, Bld: 155 mg/dL — ABNORMAL HIGH (ref 70–99)
HCT: 27 % — ABNORMAL LOW (ref 36.0–46.0)
Hemoglobin: 9.2 g/dL — ABNORMAL LOW (ref 12.0–15.0)
Potassium: 3.1 mmol/L — ABNORMAL LOW (ref 3.5–5.1)
Sodium: 136 mmol/L (ref 135–145)
TCO2: 50 mmol/L — ABNORMAL HIGH (ref 22–32)

## 2021-05-30 LAB — CULTURE, RESPIRATORY W GRAM STAIN: Culture: NORMAL

## 2021-05-30 LAB — CBC
HCT: 26.3 % — ABNORMAL LOW (ref 36.0–46.0)
Hemoglobin: 8.4 g/dL — ABNORMAL LOW (ref 12.0–15.0)
MCH: 29 pg (ref 26.0–34.0)
MCHC: 31.9 g/dL (ref 30.0–36.0)
MCV: 90.7 fL (ref 80.0–100.0)
Platelets: 424 10*3/uL — ABNORMAL HIGH (ref 150–400)
RBC: 2.9 MIL/uL — ABNORMAL LOW (ref 3.87–5.11)
RDW: 13.7 % (ref 11.5–15.5)
WBC: 15.2 10*3/uL — ABNORMAL HIGH (ref 4.0–10.5)
nRBC: 0 % (ref 0.0–0.2)

## 2021-05-30 LAB — PNEUMOCYSTIS JIROVECI SMEAR BY DFA

## 2021-05-30 SURGERY — CREATION, TRACHEOSTOMY
Anesthesia: General

## 2021-05-30 MED ORDER — LACTATED RINGERS IV SOLN: INTRAVENOUS | Status: DC | PRN

## 2021-05-30 MED ORDER — PROPOFOL 10 MG/ML IV BOLUS
INTRAVENOUS | Status: DC | PRN
  Administered 2021-05-30: 20 mg via INTRAVENOUS
  Administered 2021-05-30: 10 mg via INTRAVENOUS

## 2021-05-30 MED ORDER — FENTANYL CITRATE (PF) 250 MCG/5ML IJ SOLN
INTRAMUSCULAR | Status: AC
Start: 2021-05-30 — End: ?
  Filled 2021-05-30: qty 5

## 2021-05-30 MED ORDER — POTASSIUM CHLORIDE 10 MEQ/50ML IV SOLN
10.0000 meq | INTRAVENOUS | Status: AC
  Administered 2021-05-30 (×4): 10 meq via INTRAVENOUS
  Filled 2021-05-30 (×4): qty 50

## 2021-05-30 MED ORDER — 0.9 % SODIUM CHLORIDE (POUR BTL) OPTIME
TOPICAL | Status: DC | PRN
  Administered 2021-05-30: 1000 mL

## 2021-05-30 MED ORDER — MIDAZOLAM HCL 2 MG/2ML IJ SOLN
INTRAMUSCULAR | Status: AC
  Filled 2021-05-30: qty 2

## 2021-05-30 MED ORDER — ROCURONIUM BROMIDE 10 MG/ML (PF) SYRINGE
PREFILLED_SYRINGE | INTRAVENOUS | Status: DC | PRN
  Administered 2021-05-30 (×2): 50 mg via INTRAVENOUS

## 2021-05-30 MED ORDER — FENTANYL CITRATE (PF) 250 MCG/5ML IJ SOLN
INTRAMUSCULAR | Status: DC | PRN
  Administered 2021-05-30 (×3): 50 ug via INTRAVENOUS

## 2021-05-30 MED ORDER — POTASSIUM CHLORIDE 20 MEQ PO PACK
40.0000 meq | PACK | Freq: Once | ORAL | Status: DC
  Filled 2021-05-30: qty 2

## 2021-05-30 MED ORDER — MIDAZOLAM HCL 5 MG/5ML IJ SOLN
INTRAMUSCULAR | Status: DC | PRN
  Administered 2021-05-30: 2 mg via INTRAVENOUS

## 2021-05-30 MED ORDER — POTASSIUM CHLORIDE 10 MEQ/50ML IV SOLN
10.0000 meq | INTRAVENOUS | Status: AC
  Administered 2021-05-30 (×4): 10 meq via INTRAVENOUS
  Filled 2021-05-30 (×3): qty 50

## 2021-05-30 MED ORDER — PROPOFOL 10 MG/ML IV BOLUS
INTRAVENOUS | Status: AC
  Filled 2021-05-30: qty 20

## 2021-05-30 MED ORDER — ONDANSETRON HCL 4 MG/2ML IJ SOLN
INTRAMUSCULAR | Status: DC | PRN
  Administered 2021-05-30: 4 mg via INTRAVENOUS

## 2021-05-30 MED ORDER — POTASSIUM CHLORIDE 10 MEQ/50ML IV SOLN
10.0000 meq | INTRAVENOUS | Status: AC
  Administered 2021-05-30 – 2021-05-31 (×4): 10 meq via INTRAVENOUS
  Filled 2021-05-30 (×4): qty 50

## 2021-05-30 MED ORDER — LIDOCAINE HCL (PF) 1 % IJ SOLN
INTRAMUSCULAR | Status: DC | PRN
  Administered 2021-05-30: 2 mL

## 2021-05-30 MED ORDER — ONDANSETRON HCL 4 MG/2ML IJ SOLN
INTRAMUSCULAR | Status: AC
  Filled 2021-05-30: qty 2

## 2021-05-30 MED ORDER — ACETAZOLAMIDE SODIUM 500 MG IJ SOLR
500.0000 mg | Freq: Once | INTRAMUSCULAR | Status: AC
  Administered 2021-05-30: 500 mg via INTRAVENOUS
  Filled 2021-05-30: qty 500

## 2021-05-30 MED ORDER — PROPOFOL 1000 MG/100ML IV EMUL
INTRAVENOUS | Status: AC
  Filled 2021-05-30: qty 100

## 2021-05-30 MED ORDER — ROCURONIUM BROMIDE 10 MG/ML (PF) SYRINGE
PREFILLED_SYRINGE | INTRAVENOUS | Status: AC
  Filled 2021-05-30: qty 30

## 2021-05-30 SURGICAL SUPPLY — 43 items
BAG COUNTER SPONGE SURGICOUNT (BAG) ×3 IMPLANT
BAG SPNG CNTER NS LX DISP (BAG) ×1
BLADE CLIPPER SURG (BLADE) IMPLANT
BLOCK BITE 60FR ADLT L/F BLUE (MISCELLANEOUS) ×3 IMPLANT
BUTTON OLYMPUS DEFENDO 5 PIECE (MISCELLANEOUS) ×3 IMPLANT
CANISTER SUCT 3000ML PPV (MISCELLANEOUS) ×3 IMPLANT
COVER SURGICAL LIGHT HANDLE (MISCELLANEOUS) ×3 IMPLANT
DRAPE UTILITY XL STRL (DRAPES) ×3 IMPLANT
DRSG TEGADERM 4X4.75 (GAUZE/BANDAGES/DRESSINGS) ×3 IMPLANT
DRSG TELFA 3X8 NADH (GAUZE/BANDAGES/DRESSINGS) ×2 IMPLANT
ELECT CAUTERY BLADE 6.4 (BLADE) ×3 IMPLANT
ELECT REM PT RETURN 9FT ADLT (ELECTROSURGICAL) ×2
ELECTRODE REM PT RTRN 9FT ADLT (ELECTROSURGICAL) ×2 IMPLANT
GAUZE 4X4 16PLY ~~LOC~~+RFID DBL (SPONGE) ×3 IMPLANT
GLOVE SURG ENC MOIS LTX SZ6.5 (GLOVE) ×3 IMPLANT
GLOVE SURG UNDER POLY LF SZ6 (GLOVE) ×3 IMPLANT
GOWN STRL REUS W/ TWL LRG LVL3 (GOWN DISPOSABLE) ×4 IMPLANT
GOWN STRL REUS W/TWL LRG LVL3 (GOWN DISPOSABLE) ×4
INTRODUCER TRACH BLUE RHINO 6F (TUBING) IMPLANT
KIT BASIN OR (CUSTOM PROCEDURE TRAY) ×3 IMPLANT
KIT CLEAN ENDO COMPLIANCE (KITS) ×3 IMPLANT
KIT TURNOVER KIT B (KITS) ×3 IMPLANT
NS IRRIG 1000ML POUR BTL (IV SOLUTION) ×3 IMPLANT
PACK EENT II TURBAN DRAPE (CUSTOM PROCEDURE TRAY) ×3 IMPLANT
PAD ARMBOARD 7.5X6 YLW CONV (MISCELLANEOUS) ×6 IMPLANT
PAD DRESSING TELFA 3X8 NADH (GAUZE/BANDAGES/DRESSINGS) ×2 IMPLANT
PENCIL BUTTON HOLSTER BLD 10FT (ELECTRODE) ×3 IMPLANT
SPONGE DRAIN TRACH 4X4 STRL 2S (GAUZE/BANDAGES/DRESSINGS) ×1 IMPLANT
SPONGE INTESTINAL PEANUT (DISPOSABLE) ×3 IMPLANT
SUT SILK 2 0 SH (SUTURE) IMPLANT
SUT VIC AB 2-0 SH 27 (SUTURE) ×2
SUT VIC AB 2-0 SH 27XBRD (SUTURE) IMPLANT
SUT VICRYL AB 3 0 TIES (SUTURE) ×3 IMPLANT
SYR 20ML LL LF (SYRINGE) ×3 IMPLANT
TOWEL GREEN STERILE (TOWEL DISPOSABLE) ×3 IMPLANT
TOWEL GREEN STERILE FF (TOWEL DISPOSABLE) ×3 IMPLANT
TRAY W/TRACH TUBE 7.5 (MISCELLANEOUS) ×1 IMPLANT
TUBE CONNECTING 12X1/4 (SUCTIONS) ×3 IMPLANT
TUBE ENDOVIVE SAFETY PEG 24 (TUBING) ×4 IMPLANT
TUBE TRACH  6.0 CUFF FLEX (MISCELLANEOUS) ×2
TUBE TRACH 6.0 CUFF FLEX (MISCELLANEOUS) IMPLANT
TUBING ENDO SMARTCAP (MISCELLANEOUS) ×4 IMPLANT
WATER STERILE IRR 1000ML POUR (IV SOLUTION) ×3 IMPLANT

## 2021-05-30 NOTE — Progress Notes (Signed)
[facility] ADULT ICU REPLACEMENT PROTOCOL ? ? ?The patient does apply for the [facility] Adult ICU Electrolyte Replacment Protocol based on the criteria listed below:  ? ?1.Exclusion criteria: TCTS patients, ECMO patients, and Dialysis patients ?2. Is GFR >/= 30 ml/min? Yes.    ?Patient's GFR today is > 60 ?3. Is SCr </= 2? Yes.   ?Patient's SCr is 0.70 mg/dL ?4. Did SCr increase >/= 0.5 in 24 hours? No. ?5.Pt's weight >40kg  Yes.   ?6. Abnormal electrolyte(s): K+ 2.7  ?7. Electrolytes replaced per protocol ?8.  Call MD STAT for K+ </= 2.5, Phos </= 1, or Mag </= 1 ?Physician:  Dr Prudencio Burly ? ?Sigmund Hazel 05/30/2021 6:40 AM  ?

## 2021-05-30 NOTE — Anesthesia Postprocedure Evaluation (Signed)
Anesthesia Post Note ? ?Patient: Cassidy Tran ? ?Procedure(s) Performed: TRACHEOSTOMY ?PERCUTANEOUS ENDOSCOPIC GASTROSTOMY (PEG) PLACEMENT ? ?  ? ?Patient location during evaluation: SICU ?Anesthesia Type: General ?Level of consciousness: sedated ?Pain management: pain level controlled ?Vital Signs Assessment: post-procedure vital signs reviewed and stable ?Respiratory status: patient remains intubated per anesthesia plan ?Cardiovascular status: stable ?Postop Assessment: no apparent nausea or vomiting ?Anesthetic complications: no ? ? ?No notable events documented. ? ?Last Vitals:  ?Vitals:  ? 05/30/21 1657 05/30/21 1700  ?BP:    ?Pulse: 72 72  ?Resp: 20 20  ?Temp:    ?SpO2: 93% 95%  ?  ?Last Pain:  ?Vitals:  ? 05/30/21 1200  ?TempSrc: Axillary  ?PainSc:   ? ? ?  ?  ?  ?  ?  ?  ? ?Cassidy Tran Cassidy Tran ? ? ? ? ?

## 2021-05-30 NOTE — Op Note (Signed)
? ?Operative Note ? ?Date: 05/30/2021 ? ?Procedure: percutaneous tracheostomy without bronchoscopic assistance, esophagogastroduodenoscopy (EGD) and percutaneous endoscopic gastrostomy (PEG) tube placement ? ?Pre-op diagnosis: prolonged mechanical ventilation, dysphagia ?Post-op diagnosis: same ? ?Indication and clinical history: The patient  is a 46 y.o. year old female with prolonged mechanical ventilation and dysphagia ? ?Surgeon: [name], MD ?Assistant: Rodolph Bong, PA ? ?Findings:  ?Specimen: none ?EBL: <5cc  ?Drains/Implants: #6 Shiley cuffed  tracheostomy tube,  PEG tube, 3.5cm at the skin  ? ?Disposition: ICU ? ?Description of procedure: The patient was positioned supine with a shoulder roll. Time-out was performed verifying correct patient, procedure, signature of informed consent, and pre-operative antibiotics as indicated. MAC induction was uneventful and the patient was confirmed to be on 100% FiO2. The neck was prepped and draped in the usual sterile fashion. Palpation of neck anatomy was performed. A longitudinal incision was made in the neck and deepened down to the trachea.  ? ?The pilot balloon was deflated and the endotracheal tube slowly retracted proximally until the tip of the endotracheal tube was palpated just below the cricoid cartilage. An introducer needle was inserted between the second and third tracheal rings. A guidewire was passed through the introducer needle and the needle removed. Serial dilation was performed and a #6   cuffed Shiley and stylet were inserted over the guidewire and the guidewire and stylet removed. The inner cannula was inserted, the pilot balloon on the tracheostomy inflated, and the ventilator tubing disconnected from the endotracheal tube and connected to the tracheostomy. Chest rise, appropriate end-tidal CO2, and return tidal volumes were confirmed. The tracheostomy tube was sutured in four quadrants and a tracheostomy tie applied to the neck. The  endotracheal tube was removed. The patient tolerated the procedure well. There were no complications. Post-procedure chest x-ray was ordered to confirm tube position and the absence of a pneumothorax. ? ?Next, a bite block was placed into the oropharynx. The endoscope was inserted into the oropharynx and advanced down the esophagus into the stomach and into the duodenum. The visualized esophagus and duodenum were unremarkable. The endoscope was retracted back into the stomach and the stomach was insufflated. The stomach was inspected and was also normal. Transillumination was performed. The light was visible on the external skin and dimpling of the stomach was noted endoscopically with manual pressure. The abdomen was prepped and draped in the usual sterile fashion. Transillumination and dimpling were repeated and local anesthetic was infiltrated to make a skin wheal at the site of transillumination. The needle was inserted perpendicularly to the skin and the tip of the needle was visualized endoscopically. As the needle was retracted, the tract was also anesthetized. A skin nick was made at the site of the wheal and an introducer needle and sheath were inserted. The needle was removed and guidewire inserted. The guidewire was grasped by an endoscopic snare and the snare, guidewire, and endoscope retracted out of the oropharynx. The PEG tube was secured to the guidewire and retracted through the mouth and esophagus into the stomach. The PEG tube was secured with a bolster and was visualized endoscopically to spin freely circumferentially and also be without gaps between the internal bumper and the stomach wall. There was no evidence of bleeding. The PEG bolster was secured at  3.5 cm at the skin and there were no gaps between the bolster and the abdominal wall. The stomach was desufflated endoscopically and the endoscope removed. The bite block was also removed. The patient tolerated the  procedure well and there  were no complications.  ? ?The patient may have water and medications administered via the PEG tube beginning immediately and tube feeds may be initiated four hours post-procedure.  ? ? ?[name], MD ?General and Trauma Surgery ?[facility] ? ?

## 2021-05-30 NOTE — Progress Notes (Signed)
Pharmacy note: electrolyte replacement ?-K= 3.4 ? ?Plan ?-59mq IV KCL x4 doses ?-BMET in am ? ?AHildred Laser PharmD ?Clinical Pharmacist ?**Pharmacist phone directory can now be found on [email or web address] ([email or web address]).  Listed under [place] ? ?e ?

## 2021-05-30 NOTE — Progress Notes (Signed)
? ?Trauma/Critical Care Follow Up Note ? ?Subjective:  ?  ?Overnight Issues:  ? ?Objective:  ?Vital signs for last 24 hours: ?Temp:  [97.7 ?F (36.5 ?C)-100.2 ?F (37.9 ?C)] 97.7 ?F (36.5 ?C) (04/03 0800) ?Pulse Rate:  [66-96] 73 (04/03 0800) ?Resp:  [18-23] 20 (04/03 0800) ?BP: (94-157)/(39-66) 112/48 (04/03 0800) ?SpO2:  [94 %-100 %] 98 % (04/03 0800) ?Arterial Line BP: (123-159)/(55-71) 143/61 (04/03 0800) ?FiO2 (%):  [40 %] 40 % (04/03 0758) ?Weight:  [90.2 kg] 90.2 kg (04/03 0500) ? ?Hemodynamic parameters for last 24 hours: ?  ? ?Intake/Output from previous day: ?04/02 0701 - 04/03 0700 ?In: 1710.8 [I.V.:1189.2; NG/GT:260; IV Piggyback:261.6] ?Out: 6000 [Urine:5400; Stool:600]  ?Intake/Output this shift: ?Total I/O ?In: 44.9 [I.V.:44.8; IV Piggyback:0.1] ?Out: 450 [Urine:450] ? ?Vent settings for last 24 hours: ?Vent Mode: PRVC ?FiO2 (%):  [40 %] 40 % ?Set Rate:  [20 bmp] 20 bmp ?Vt Set:  [420 mL] 420 mL ?PEEP:  [8 cmH20] 8 cmH20 ?Plateau Pressure:  [28 cmH20-35 cmH20] 35 cmH20 ? ?Physical Exam:  ?Gen: comfortable, no distress ?Neuro: interactive, f/c ?HEENT: PERRL ?Neck: supple ?CV: RRR ?Pulm: unlabored breathing ?Abd: soft, NT ?GU: clear yellow urine ?Extr: wwp, no edema ? ? ?Results for orders placed or performed during the hospital encounter of 05/05/21 (from the past 24 hour(s))  ?Glucose, capillary     Status: Abnormal  ? Collection Time: 05/29/21 11:44 AM  ?Result Value Ref Range  ? Glucose-Capillary 142 (H) 70 - 99 mg/dL  ?Glucose, capillary     Status: Abnormal  ? Collection Time: 05/29/21  3:48 PM  ?Result Value Ref Range  ? Glucose-Capillary 120 (H) 70 - 99 mg/dL  ?Glucose, capillary     Status: Abnormal  ? Collection Time: 05/29/21  6:52 PM  ?Result Value Ref Range  ? Glucose-Capillary 127 (H) 70 - 99 mg/dL  ?Glucose, capillary     Status: Abnormal  ? Collection Time: 05/29/21 11:13 PM  ?Result Value Ref Range  ? Glucose-Capillary 118 (H) 70 - 99 mg/dL  ?Glucose, capillary     Status: Abnormal  ?  Collection Time: 05/30/21  3:31 AM  ?Result Value Ref Range  ? Glucose-Capillary 128 (H) 70 - 99 mg/dL  ?CBC     Status: Abnormal  ? Collection Time: 05/30/21  5:32 AM  ?Result Value Ref Range  ? WBC 15.2 (H) 4.0 - 10.5 K/uL  ? RBC 2.90 (L) 3.87 - 5.11 MIL/uL  ? Hemoglobin 8.4 (L) 12.0 - 15.0 g/dL  ? HCT 26.3 (L) 36.0 - 46.0 %  ? MCV 90.7 80.0 - 100.0 fL  ? MCH 29.0 26.0 - 34.0 pg  ? MCHC 31.9 30.0 - 36.0 g/dL  ? RDW 13.7 11.5 - 15.5 %  ? Platelets 424 (H) 150 - 400 K/uL  ? nRBC 0.0 0.0 - 0.2 %  ?Comprehensive metabolic panel     Status: Abnormal  ? Collection Time: 05/30/21  5:32 AM  ?Result Value Ref Range  ? Sodium 139 135 - 145 mmol/L  ? Potassium 2.7 (LL) 3.5 - 5.1 mmol/L  ? Chloride 80 (L) 98 - 111 mmol/L  ? CO2 >45 (H) 22 - 32 mmol/L  ? Glucose, Bld 135 (H) 70 - 99 mg/dL  ? BUN 22 (H) 6 - 20 mg/dL  ? Creatinine, Ser 0.70 0.44 - 1.00 mg/dL  ? Calcium 8.9 8.9 - 10.3 mg/dL  ? Total Protein 7.0 6.5 - 8.1 g/dL  ? Albumin 2.0 (L) 3.5 - 5.0 g/dL  ?  AST 64 (H) 15 - 41 U/L  ? ALT 91 (H) 0 - 44 U/L  ? Alkaline Phosphatase 280 (H) 38 - 126 U/L  ? Total Bilirubin 0.3 0.3 - 1.2 mg/dL  ? GFR, Estimated >60 >60 mL/min  ? Anion gap NOT CALCULATED 5 - 15  ?Glucose, capillary     Status: Abnormal  ? Collection Time: 05/30/21  7:22 AM  ?Result Value Ref Range  ? Glucose-Capillary 146 (H) 70 - 99 mg/dL  ? ? ?Assessment & Plan: ?The plan of care was discussed with the bedside nurse for the day, who is in agreement with this plan and no additional concerns were raised.  ? ?Present on Admission: ? Neuroendocrine carcinoma ([place]) ? ? ? LOS: 25 days  ? ?Additional comments:I reviewed the patient's new clinical lab test results.   and I reviewed the patients new imaging test results.   ? ?Neuroendocrine tumor on colonoscopy - s/p exlap, R hemicolectomy, cholecystectomy 3/9. Well differentiated neuroendocrine tumor with 1/7 +nodes. D/w Dr. Marin Olp 3/15 will likely need adjuvant chemo. Staples out 3/24. Bottom 2cm of wound  packed. ?Respiratory failure 2/2 volume overload/?PNA - CT chest 3/11 and 3/13 with no PE. Intubated 3/16, suspect PNA based on CXR but negative resp cx and BAL, s/p 7d empiric merrem. Decompensation 3/26, likely will need trach when settings lower, d/w husband. Discussed this patient with pulmonology 3/29 AM, recommend continuing current care, no abx unless organism identified. Repeat pulm c/s 4/1, brovana, yupelri, singulair added, holding off on systemic steroids given improvement over the last couple of days. Plan for trach today ?Volume overload - previously resolved, but 3/24 PM, went into flash pulmonary edema. Cont lasix gtt to 8 due to sedation causing increased intake. Tol valium tfor decr drip reqts. Echo 3/28, essentially normal.  ?Shock - resolved ?Altered mental status - CT head 3/13, 3/16/ 3/17 normal (h/o VP shunt). Changed cefepime to zosyn 3/13, total 7d course-end today, empiric coverage for PNA. Resp cx today. Ucx 3/13 negative. Bcx 3/15 NGTD. LP 3/16 NGTD. CT A/P 3/16 negative. 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 Neurology on board, changed to keppra, cont EEG without sz, encephalopathy much improved  ?Diarrhea - d/c'd imodium to encourage NH4 loss, restarted 3/25, incr to TID, cont cholestyramine TID, added lomotil ?vWD - humate P per Heme, end 3/12, stable ?Transaminitis - likely 2/2 TPN, now d/c'd, recheck 4/3 ?Leukocytosis, fever - 15 this AM, infectious workup unrevealing, curbside ID consult 3/28 with no add'l recs, LE duplex negative 3/31 ?FEN - NPO, OGT, tol TF at goal, PA 16, d/w nutrition with RD and incr TF to 65/h, held that trach/PEG ?ABLA - stable ?DVT - SCDs, s/p DDVAP, LMWH, LE duplex neg for DVT 3/13 ?Foley - replaced 3/18, d/c'd 3/24, replaced 3/25, cont for accurate I/O on lasix gtt ?Dispo - ICU, trach/PEG today    ? ?Critical Care Total Time: 35 minutes ? ?[name], MD ?Trauma & General Surgery ?Please use [email or web address] to contact on call  provider ? ?05/30/2021 ? ?*Care during the described time interval was provided by me. I have reviewed this patient's available data, including medical history, events of note, physical examination and test results as part of my evaluation. ? ? ? ?

## 2021-05-30 NOTE — Progress Notes (Signed)
? ?NAME:  Cassidy Tran, MRN:  [id number], DOB:  1975-03-23, LOS: 25 ?ADMISSION DATE:  05/05/2021, CONSULTATION DATE:  05/28/21 ?REFERRING MD:  Bobbye Morton, CHIEF COMPLAINT:  acute hypoxic respiratory failure ? ?History of Present Illness:  ?46 yo female smoker with neuroendocrine tumor of the terminal ileum presented for planned cholecystectomy with Rt hemicolectomy which was performed on 05/05/21.  Post op course complicated by HCAP with hypoxia requiring intubation and AMS from elevated ammonia 2nd to seizure medications. ? ?Pertinent  Medical History  ?Asthma, Epilepsy, Chiari malformation s/p VPS, Epilepsy, Von Willebrand disease, Biliary dyskinesia, Neuroendocrine tumor ? ?Significant Hospital Events: ?Including procedures, antibiotic start and stop dates in addition to other pertinent events   ?3/09 ex lap, R hemicolectomy, chole ?3/10 oncology consulted ?3/11 respiratory distress, transfer to ICU, start cefepime ?3/13 concern for AMS due to hyperammonemia ?3/15 neurosurgery consult to determine if issue with VP shunt contributing to AMS ?3/16 intubation, neurology consulted, LP performed ?3/21 bronchoscopy ?3/26 ETT exchange ?4/01 bronchoscopy ? ?Studies:  ?CT angio chest 3/13 >> extensive GGO and interlobular septal thickening ?CT chest 3/27 >> stable multiple airspace opacities ?Echo 3/28 >> EF 60 to 65%, mod LVH ?Doppler legs b/l 3/31 >> no DVT ? ?Interim History / Subjective:  ?Remains on vent support, sedation, lasix gtt. ? ?Objective   ?Blood pressure (!) 112/48, pulse 73, temperature 97.7 ?F (36.5 ?C), temperature source Axillary, resp. rate 20, height '5\' 3"'$  (1.6 m), weight 90.2 kg, last menstrual period 09/16/2012, SpO2 98 %. ?   ?Vent Mode: PRVC ?FiO2 (%):  [40 %] 40 % ?Set Rate:  [20 bmp] 20 bmp ?Vt Set:  [420 mL] 420 mL ?PEEP:  [8 cmH20] 8 cmH20 ?Plateau Pressure:  [28 cmH20-35 cmH20] 35 cmH20  ? ?Intake/Output Summary (Last 24 hours) at 05/30/2021 0851 ?Last data filed at 05/30/2021 0800 ?Gross per 24 hour   ?Intake 1529.28 ml  ?Output 6450 ml  ?Net -4920.72 ml  ? ?Filed Weights  ? 05/28/21 0500 05/29/21 0500 05/30/21 0500  ?Weight: 89 kg 89.2 kg 90.2 kg  ? ? ?Examination: ? ?General - alert ?Eyes - pupils reactive ?ENT - ETT in place ?Cardiac - regular rate/rhythm, no murmur ?Chest - b/l rhonchi ?Abdomen - soft, non tender, + bowel sounds ?Extremities - 1+ edema ?Skin - no rashes ?Neuro - follows commands, moves extremities ? ?Resolved Hospital Problem list   ? ? ?Assessment & Plan:  ? ?Acute hypoxic respiratory failure 2nd to HCAP. ?Hx of asthma, tobacco abuse. ?- completed ABx 3/27 ?- likely will be prolonged vent wean process ?- surgery planning for tracheostomy this week ?- f/u CXR intermittently ?- goal SpO2 > 92% ?- continue yupelri, brovana, pulmicort, singulair ?- prn albuterol ? ?Metabolic alkalosis with hypokalemia. ?- likely from diuresis >> suggest to discontinue lasix infusion for now; defer to primary team ?- replace electrolytes ?- f/u BMET, ABG ? ?S/p laparotomy with Rt hemicolectomy and cholecystectomy on 3/09. ?- post op care, nutrition per surgery ? ?Acute metabolic encephalopathy 2nd to hypoxia, elevated ammonia. ?Hx of Chiari malformation with VP shunt. ?Epilepsy. ?- seen by neurosurgery and by neurology ?- RASS goal 0 to -1 ? ?Neuroendocrine tumor of terminal ileum. ?Von Willebrand Disease. ?- seen by Dr. Marin Olp with Hematology/Oncology ? ?Anemia of critical illness. ?- f/u CBC ?- transfuse for Hb < 7 ? ?Best Practice (right click and "Reselect all SmartList Selections" daily)  ? ?DVT: lovenox ?SUP: protonix ?Nutrition: tube feeds ?Lines: Rt PICC 3/27, Rt femoral a line 3/28 ?Code: full code ? ?  Updated pt's husband at bedside. ? ?Labs:  ? ? ?  Latest Ref Rng & Units 05/30/2021  ?  5:32 AM 05/29/2021  ?  5:58 AM 05/29/2021  ?  5:17 AM  ?CMP  ?Glucose 70 - 99 mg/dL 135   157     ?BUN 6 - 20 mg/dL 22   16     ?Creatinine 0.44 - 1.00 mg/dL 0.70   0.60     ?Sodium 135 - 145 mmol/L 139   147   143     ?Potassium 3.5 - 5.1 mmol/L 2.7   3.1   3.1    ?Chloride 98 - 111 mmol/L 80   90     ?CO2 22 - 32 mmol/L >45   44     ?Calcium 8.9 - 10.3 mg/dL 8.9   8.2     ?Total Protein 6.5 - 8.1 g/dL 7.0   6.5     ?Total Bilirubin 0.3 - 1.2 mg/dL 0.3   0.3     ?Alkaline Phos 38 - 126 U/L 280   257     ?AST 15 - 41 U/L 64   72     ?ALT 0 - 44 U/L 91   90     ? ? ? ?  Latest Ref Rng & Units 05/30/2021  ?  5:32 AM 05/29/2021  ?  5:58 AM 05/29/2021  ?  5:17 AM  ?CBC  ?WBC 4.0 - 10.5 K/uL 15.2   13.9     ?Hemoglobin 12.0 - 15.0 g/dL 8.4   7.7   8.2    ?Hematocrit 36.0 - 46.0 % 26.3   25.5   24.0    ?Platelets 150 - 400 K/uL 424   424     ? ? ?ABG ?   ?Component Value Date/Time  ? PHART 7.430 05/29/2021 0517  ? PCO2ART 74.3 ([place]) 05/29/2021 0517  ? PO2ART 60 (L) 05/29/2021 0517  ? HCO3 49.0 (H) 05/29/2021 0517  ? TCO2 >50 (H) 05/29/2021 0517  ? O2SAT 89 05/29/2021 0517  ? ? ?CBG (last 3)  ?Recent Labs  ?  05/29/21 ?2313 05/30/21 ?7510 05/30/21 ?2585  ?GLUCAP 118* 128* 146*  ? ? ?Critical care time: 42 minutes  ?Chesley Mires, MD ?[place] ?Pager - [phone] ?05/30/2021, 9:25 AM ? ? ? ?  ?

## 2021-05-30 NOTE — Transfer of Care (Signed)
Immediate Anesthesia Transfer of Care Note ? ?Patient: Cassidy Tran ? ?Procedure(s) Performed: TRACHEOSTOMY ?PERCUTANEOUS ENDOSCOPIC GASTROSTOMY (PEG) PLACEMENT ? ?Patient Location: ICU ? ?Anesthesia Type:General ? ?Level of Consciousness: Patient remains intubated per anesthesia plan ? ?Airway & Oxygen Therapy: Patient placed on Ventilator (see vital sign flow sheet for setting) ? ?Post-op Assessment: Report given to RN and Post -op Vital signs reviewed and stable ? ?Post vital signs: Reviewed and stable ? ?Last Vitals:  ?Vitals Value Taken Time  ?BP 146/55 (aline)   ?Temp    ?Pulse 72 05/30/21 1658  ?Resp 20 05/30/21 1658  ?SpO2 95 % 05/30/21 1658  ?Vitals shown include unvalidated device data. ? ?Last Pain:  ?Vitals:  ? 05/30/21 1200  ?TempSrc: Axillary  ?PainSc:   ?   ? ?Patients Stated Pain Goal: 0 (05/09/21 0047) ? ?Complications: No notable events documented. ?

## 2021-05-30 NOTE — Anesthesia Preprocedure Evaluation (Signed)
Anesthesia Evaluation  ?Patient identified by MRN, date of birth, ID band ?Patient awake ? ?General Assessment Comment:46 yo female smoker with neuroendocrine tumor of the terminal ileum presented for planned cholecystectomy with Rt hemicolectomy which was performed on 05/05/21.  Post op course complicated by HCAP with hypoxia requiring intubation and AMS from elevated ammonia 2nd to seizure medications. ? ?Reviewed: ?Patient's Chart, lab work & pertinent test results, Unable to perform ROS - Chart review only ? ?Airway ?Mallampati: Intubated ? ?TM Distance: >3 FB ?Neck ROM: Full ? ? ? Dental ?no notable dental hx. ? ?  ?Pulmonary ?pneumonia, Current Smoker and Patient abstained from smoking.,  ?  ?Pulmonary exam normal ?breath sounds clear to auscultation ? ? ? ? ? ? Cardiovascular ?hypertension, Normal cardiovascular exam ?Rhythm:Regular Rate:Normal ? ? ?  ?Neuro/Psych ?negative neurological ROS ? negative psych ROS  ? GI/Hepatic ?negative GI ROS, Neg liver ROS,   ?Endo/Other  ?negative endocrine ROS ? Renal/GU ?negative Renal ROS  ?negative genitourinary ?  ?Musculoskeletal ?negative musculoskeletal ROS ?(+)  ? Abdominal ?  ?Peds ?negative pediatric ROS ?(+)  Hematology ? ?(+) Blood dyscrasia, anemia ,   ?Anesthesia Other Findings ? ? Reproductive/Obstetrics ?negative OB ROS ? ?  ? ? ? ? ? ? ? ? ? ? ? ? ? ?  ?  ? ? ? ? ? ? ? ? ?Anesthesia Physical ?Anesthesia Plan ? ?ASA: 4 ? ?Anesthesia Plan: General  ? ?Post-op Pain Management: Minimal or no pain anticipated  ? ?Induction: Intravenous ? ?PONV Risk Score and Plan: 2 and Ondansetron and Treatment may vary due to age or medical condition ? ?Airway Management Planned: Oral ETT ? ?Additional Equipment:  ? ?Intra-op Plan:  ? ?Post-operative Plan: Post-operative intubation/ventilation ? ?Informed Consent: I have reviewed the patients History and Physical, chart, labs and discussed the procedure including the risks, benefits and  alternatives for the proposed anesthesia with the patient or authorized representative who has indicated his/her understanding and acceptance.  ? ? ? ?Dental advisory given ? ?Plan Discussed with: CRNA and Surgeon ? ?Anesthesia Plan Comments:   ? ? ? ? ? ? ?Anesthesia Quick Evaluation ? ?

## 2021-05-31 ENCOUNTER — Inpatient Hospital Stay (HOSPITAL_COMMUNITY): Payer: No Typology Code available for payment source

## 2021-05-31 ENCOUNTER — Encounter (HOSPITAL_COMMUNITY): Payer: Self-pay | Admitting: Surgery

## 2021-05-31 DIAGNOSIS — Z9911 Dependence on respirator [ventilator] status: Secondary | ICD-10-CM | POA: Diagnosis not present

## 2021-05-31 DIAGNOSIS — Z930 Tracheostomy status: Secondary | ICD-10-CM | POA: Diagnosis not present

## 2021-05-31 DIAGNOSIS — J9601 Acute respiratory failure with hypoxia: Secondary | ICD-10-CM | POA: Diagnosis not present

## 2021-05-31 LAB — POCT I-STAT 7, (LYTES, BLD GAS, ICA,H+H)
Acid-Base Excess: 16 mmol/L — ABNORMAL HIGH (ref 0.0–2.0)
Bicarbonate: 41.2 mmol/L — ABNORMAL HIGH (ref 20.0–28.0)
Calcium, Ion: 1.05 mmol/L — ABNORMAL LOW (ref 1.15–1.40)
HCT: 26 % — ABNORMAL LOW (ref 36.0–46.0)
Hemoglobin: 8.8 g/dL — ABNORMAL LOW (ref 12.0–15.0)
O2 Saturation: 98 %
Patient temperature: 97.8
Potassium: 2.7 mmol/L — CL (ref 3.5–5.1)
Sodium: 137 mmol/L (ref 135–145)
TCO2: 43 mmol/L — ABNORMAL HIGH (ref 22–32)
pCO2 arterial: 55.7 mmHg — ABNORMAL HIGH (ref 32–48)
pH, Arterial: 7.476 — ABNORMAL HIGH (ref 7.35–7.45)
pO2, Arterial: 102 mmHg (ref 83–108)

## 2021-05-31 LAB — CBC
HCT: 26.5 % — ABNORMAL LOW (ref 36.0–46.0)
Hemoglobin: 8.5 g/dL — ABNORMAL LOW (ref 12.0–15.0)
MCH: 29 pg (ref 26.0–34.0)
MCHC: 32.1 g/dL (ref 30.0–36.0)
MCV: 90.4 fL (ref 80.0–100.0)
Platelets: 394 10*3/uL (ref 150–400)
RBC: 2.93 MIL/uL — ABNORMAL LOW (ref 3.87–5.11)
RDW: 14.1 % (ref 11.5–15.5)
WBC: 14.4 10*3/uL — ABNORMAL HIGH (ref 4.0–10.5)
nRBC: 0 % (ref 0.0–0.2)

## 2021-05-31 LAB — BASIC METABOLIC PANEL
Anion gap: 11 (ref 5–15)
BUN: 22 mg/dL — ABNORMAL HIGH (ref 6–20)
CO2: 36 mmol/L — ABNORMAL HIGH (ref 22–32)
Calcium: 7.9 mg/dL — ABNORMAL LOW (ref 8.9–10.3)
Chloride: 95 mmol/L — ABNORMAL LOW (ref 98–111)
Creatinine, Ser: 0.68 mg/dL (ref 0.44–1.00)
GFR, Estimated: >60 mL/min (ref >60)
Glucose, Bld: 125 mg/dL — ABNORMAL HIGH (ref 70–99)
Potassium: 3.2 mmol/L — ABNORMAL LOW (ref 3.5–5.1)
Sodium: 142 mmol/L (ref 135–145)

## 2021-05-31 LAB — COMPREHENSIVE METABOLIC PANEL
ALT: 65 U/L — ABNORMAL HIGH (ref 0–44)
AST: 34 U/L (ref 15–41)
Albumin: 2.1 g/dL — ABNORMAL LOW (ref 3.5–5.0)
Alkaline Phosphatase: 254 U/L — ABNORMAL HIGH (ref 38–126)
Anion gap: 12 (ref 5–15)
BUN: 24 mg/dL — ABNORMAL HIGH (ref 6–20)
CO2: 41 mmol/L — ABNORMAL HIGH (ref 22–32)
Calcium: 8.5 mg/dL — ABNORMAL LOW (ref 8.9–10.3)
Chloride: 87 mmol/L — ABNORMAL LOW (ref 98–111)
Creatinine, Ser: 0.8 mg/dL (ref 0.44–1.00)
GFR, Estimated: >60 mL/min (ref >60)
Glucose, Bld: 155 mg/dL — ABNORMAL HIGH (ref 70–99)
Potassium: 2.7 mmol/L — CL (ref 3.5–5.1)
Sodium: 140 mmol/L (ref 135–145)
Total Bilirubin: 0.6 mg/dL (ref 0.3–1.2)
Total Protein: 7.2 g/dL (ref 6.5–8.1)

## 2021-05-31 LAB — GLUCOSE, CAPILLARY
Glucose-Capillary: 122 mg/dL — ABNORMAL HIGH (ref 70–99)
Glucose-Capillary: 125 mg/dL — ABNORMAL HIGH (ref 70–99)
Glucose-Capillary: 130 mg/dL — ABNORMAL HIGH (ref 70–99)
Glucose-Capillary: 191 mg/dL — ABNORMAL HIGH (ref 70–99)
Glucose-Capillary: 198 mg/dL — ABNORMAL HIGH (ref 70–99)
Glucose-Capillary: 224 mg/dL — ABNORMAL HIGH (ref 70–99)

## 2021-05-31 LAB — PHOSPHORUS: Phosphorus: 4.7 mg/dL — ABNORMAL HIGH (ref 2.5–4.6)

## 2021-05-31 LAB — MAGNESIUM: Magnesium: 2.2 mg/dL (ref 1.7–2.4)

## 2021-05-31 MED ORDER — POTASSIUM CHLORIDE 20 MEQ PO PACK: 40.0000 meq | PACK | ORAL | Status: DC

## 2021-05-31 MED ORDER — POTASSIUM CHLORIDE 20 MEQ PO PACK
40.0000 meq | PACK | Freq: Once | ORAL | Status: AC
  Administered 2021-05-31: 40 meq
  Filled 2021-05-31: qty 2

## 2021-05-31 MED ORDER — POTASSIUM CHLORIDE 10 MEQ/50ML IV SOLN
10.0000 meq | INTRAVENOUS | Status: AC
  Administered 2021-05-31 (×4): 10 meq via INTRAVENOUS
  Filled 2021-05-31 (×4): qty 50

## 2021-05-31 MED ORDER — ACETAZOLAMIDE SODIUM 500 MG IJ SOLR
500.0000 mg | Freq: Once | INTRAMUSCULAR | Status: AC
  Administered 2021-05-31: 500 mg via INTRAVENOUS
  Filled 2021-05-31: qty 500

## 2021-05-31 MED ORDER — MORPHINE SULFATE (PF) 2 MG/ML IV SOLN
2.0000 mg | INTRAVENOUS | Status: DC | PRN
  Administered 2021-05-31: 4 mg via INTRAVENOUS
  Administered 2021-05-31 – 2021-06-02 (×2): 2 mg via INTRAVENOUS
  Filled 2021-05-31: qty 2
  Filled 2021-05-31 (×2): qty 1

## 2021-05-31 MED ORDER — STERILE WATER FOR INJECTION IJ SOLN
INTRAMUSCULAR | Status: AC
  Administered 2021-05-31: 5 mL
  Filled 2021-05-31: qty 10

## 2021-05-31 NOTE — Plan of Care (Signed)
?  Problem: Clinical Measurements: Goal: Cardiovascular complication will be avoided Outcome: Progressing   Problem: Nutrition: Goal: Adequate nutrition will be maintained Outcome: Progressing   Problem: Pain Managment: Goal: General experience of comfort will improve Outcome: Progressing   Problem: Safety: Goal: Ability to remain free from injury will improve Outcome: Progressing   

## 2021-05-31 NOTE — Progress Notes (Signed)
? ?NAME:  Cassidy Tran, MRN:  [id number], DOB:  [birth year], LOS: 70 ?ADMISSION DATE:  05/05/2021, CONSULTATION DATE:  05/28/21 ?REFERRING MD:  Bobbye Morton, CHIEF COMPLAINT:  acute hypoxic respiratory failure ? ?History of Present Illness:  ?46 yo female smoker with neuroendocrine tumor of the terminal ileum presented for planned cholecystectomy with Rt hemicolectomy which was performed on 05/05/21.  Post op course complicated by HCAP with hypoxia requiring intubation and AMS from elevated ammonia 2nd to seizure medications. ? ?Pertinent  Medical History  ?Asthma, Epilepsy, Chiari malformation s/p VPS, Epilepsy, Von Willebrand disease, Biliary dyskinesia, Neuroendocrine tumor ? ?Significant Hospital Events: ?Including procedures, antibiotic start and stop dates in addition to other pertinent events   ?3/09 ex lap, R hemicolectomy, chole ?3/10 oncology consulted ?3/11 respiratory distress, transfer to ICU, start cefepime ?3/13 concern for AMS due to hyperammonemia ?3/15 neurosurgery consult to determine if issue with VP shunt contributing to AMS ?3/16 intubation, neurology consulted, LP performed ?3/21 bronchoscopy ?3/26 ETT exchange ?4/01 bronchoscopy ?4/03 tracheostomy ? ?Studies:  ?CT angio chest 3/13 >> extensive GGO and interlobular septal thickening ?CT chest 3/27 >> stable multiple airspace opacities ?Echo 3/28 >> EF 60 to 65%, mod LVH ?Doppler legs b/l 3/31 >> no DVT ? ?Interim History / Subjective:  ?Had trach yesterday.  Tolerating some pressure support this morning. ? ?Objective   ?Blood pressure (!) 104/46, pulse 87, temperature 97.8 ?F (36.6 ?C), temperature source Axillary, resp. rate (!) 22, height '5\' 3"'$  (1.6 m), weight 89.9 kg, last menstrual period 09/16/2012, SpO2 98 %. ?   ?Vent Mode: PRVC ?FiO2 (%):  [40 %-60 %] 50 % ?Set Rate:  [20 bmp] 20 bmp ?Vt Set:  [420 mL] 420 mL ?PEEP:  [8 cmH20] 8 cmH20 ?Plateau Pressure:  [29 cmH20-30 cmH20] 29 cmH20  ? ?Intake/Output Summary (Last 24 hours) at 05/31/2021  0821 ?Last data filed at 05/31/2021 0600 ?Gross per 24 hour  ?Intake 2168.09 ml  ?Output 2025 ml  ?Net 143.09 ml  ? ?Filed Weights  ? 05/29/21 0500 05/30/21 0500 05/31/21 0500  ?Weight: 89.2 kg 90.2 kg 89.9 kg  ? ? ?Examination: ? ?General - alert ?Eyes - pupils reactive ?ENT - trach site clean ?Cardiac - regular rate/rhythm, no murmur ?Chest - equal breath sounds b/l, no wheezing or rales ?Abdomen - soft, non tender, + bowel sounds ?Extremities - no cyanosis, clubbing, or edema ?Skin - no rashes ?Neuro - normal strength, moves extremities, follows commands ? ?Resolved Hospital Problem list   ? ? ?Assessment & Plan:  ? ?Acute hypoxic respiratory failure 2nd to HCAP. ?Hx of asthma, tobacco abuse. ?Failure to wean from ventilator s/p tracheostomy on 4/03. ?- completed ABx 3/27 ?- pressure support wean to trach collar as tolerated ?- goal SpO2 > 92% ?- f/u CXR intermittently ?- continue yupelri, brovana, pulmicort, singulair ?- prn albuterol ? ?Metabolic alkalosis with hypokalemia. ?- likely from diuresis >> lasix discontinued on 4/03 ?- replace electrolytes per primary team ?- f/u BMET ? ?S/p laparotomy with Rt hemicolectomy and cholecystectomy on 3/09. ?- post op care, nutrition per surgery ? ?Acute metabolic encephalopathy 2nd to hypoxia, elevated ammonia. ?Hx of Chiari malformation with VP shunt. ?Epilepsy. ?- seen by neurosurgery and by neurology ?- RASS goal 0 to -1 ? ?Neuroendocrine tumor of terminal ileum. ?Von Willebrand Disease. ?- seen by Dr. Marin Olp with Hematology/Oncology ? ?Anemia of critical illness. ?- f/u CBC ?- transfuse for Hb < 7 ? ?Best Practice (right click and "Reselect all SmartList Selections" daily)  ? ?DVT: lovenox ?SUP:  protonix ?Nutrition: tube feeds ?Lines: Rt PICC 3/27, Rt femoral a line 3/28 ?Code: full code ? ?Labs:  ? ? ?  Latest Ref Rng & Units 05/31/2021  ?  5:38 AM 05/31/2021  ?  4:53 AM 05/30/2021  ?  6:45 PM  ?CMP  ?Glucose 70 - 99 mg/dL 155    130    ?BUN 6 - 20 mg/dL 24    22     ?Creatinine 0.44 - 1.00 mg/dL 0.80    0.64    ?Sodium 135 - 145 mmol/L 140   137   138    ?Potassium 3.5 - 5.1 mmol/L 2.7   2.7   3.4    ?Chloride 98 - 111 mmol/L 87    82    ?CO2 22 - 32 mmol/L 41    >45    ?Calcium 8.9 - 10.3 mg/dL 8.5    8.4    ?Total Protein 6.5 - 8.1 g/dL 7.2      ?Total Bilirubin 0.3 - 1.2 mg/dL 0.6      ?Alkaline Phos 38 - 126 U/L 254      ?AST 15 - 41 U/L 34      ?ALT 0 - 44 U/L 65      ? ? ? ?  Latest Ref Rng & Units 05/31/2021  ?  5:38 AM 05/31/2021  ?  4:53 AM 05/30/2021  ?  4:23 PM  ?CBC  ?WBC 4.0 - 10.5 K/uL 14.4      ?Hemoglobin 12.0 - 15.0 g/dL 8.5   8.8   9.2    ?Hematocrit 36.0 - 46.0 % 26.5   26.0   27.0    ?Platelets 150 - 400 K/uL 394      ? ? ?ABG ?   ?Component Value Date/Time  ? PHART 7.476 (H) 05/31/2021 0453  ? PCO2ART 55.7 (H) 05/31/2021 0453  ? PO2ART 102 05/31/2021 0453  ? HCO3 41.2 (H) 05/31/2021 0453  ? TCO2 43 (H) 05/31/2021 0453  ? O2SAT 98 05/31/2021 0453  ? ? ?CBG (last 3)  ?Recent Labs  ?  05/30/21 ?2317 05/31/21 ?0321 05/31/21 ?0732  ?GLUCAP 121* 125* 191*  ? ? ?Signature:  ?Chesley Mires, MD ?[place] ?Pager - [phone] ?05/31/2021, 8:21 AM ? ? ? ?  ?

## 2021-05-31 NOTE — Progress Notes (Signed)
K+ value on ABG reported to RN. ?

## 2021-05-31 NOTE — Progress Notes (Signed)
SLP Cancellation Note ? ?Patient Details ?Name: Cassidy Tran ?MRN: [id number] ?DOB: 06-23-1975 ? ? ?Cancelled evaluations:       Reason Eval/Treat Not Completed: Medical issues which prohibited therapy- orders received for PMV and swallow evaluations. Pt trached yesterday; on vent. Spoke with RN. SLP will follow for readiness to begin PMV trials. ? ?Gauge Winski L. Zaedyn Covin, MA CCC/SLP ?[facility] Services ?Office number [phone] ?Pager [phone] ? ? ? ?Juan Quam Laurice ?05/31/2021, 10:05 AM ?

## 2021-05-31 NOTE — Progress Notes (Signed)
? ?Trauma/Critical Care Follow Up Note ? ?Subjective:  ?  ?Overnight Issues:  ? ?Objective:  ?Vital signs for last 24 hours: ?Temp:  [97.6 ?F (36.4 ?C)-99.4 ?F (37.4 ?C)] 99 ?F (37.2 ?C) (04/04 0800) ?Pulse Rate:  [70-91] 91 (04/04 0900) ?Resp:  [6-22] 15 (04/04 0900) ?BP: (96-144)/(43-62) 110/56 (04/04 0900) ?SpO2:  [93 %-100 %] 95 % (04/04 0900) ?Arterial Line BP: (107-149)/(49-61) 125/53 (04/04 0900) ?FiO2 (%):  [40 %-60 %] 40 % (04/04 2542) ?Weight:  [89.9 kg] 89.9 kg (04/04 0500) ? ?Hemodynamic parameters for last 24 hours: ?  ? ?Intake/Output from previous day: ?04/03 0701 - 04/04 0700 ?In: 2213 [I.V.:1539.8; NG/GT:134; IV Piggyback:539.2] ?Out: 2475 [Urine:2450; Blood:25]  ?Intake/Output this shift: ?Total I/O ?In: 287.1 [I.V.:140.4; NG/GT:130; IV Piggyback:16.8] ?Out: 375 [Urine:375] ? ?Vent settings for last 24 hours: ?Vent Mode: CPAP;PSV ?FiO2 (%):  [40 %-60 %] 40 % ?Set Rate:  [20 bmp] 20 bmp ?Vt Set:  [420 mL] 420 mL ?PEEP:  [5 cmH20-8 cmH20] 5 cmH20 ?Pressure Support:  [10 cmH20] 10 cmH20 ?Plateau Pressure:  [29 cmH20-30 cmH20] 29 cmH20 ? ?Physical Exam:  ?Gen: comfortable, no distress ?Neuro: non-focal exam, f/c ?HEENT: PERRL ?Neck: supple, trached ?CV: RRR ?Pulm: unlabored breathing ?Abd: soft, NT, PEG  ?GU: clear yellow urine ? ? ?Results for orders placed or performed during the hospital encounter of 05/05/21 (from the past 24 hour(s))  ?Glucose, capillary     Status: Abnormal  ? Collection Time: 05/30/21 11:50 AM  ?Result Value Ref Range  ? Glucose-Capillary 133 (H) 70 - 99 mg/dL  ?I-STAT, chem 8     Status: Abnormal  ? Collection Time: 05/30/21  4:23 PM  ?Result Value Ref Range  ? Sodium 136 135 - 145 mmol/L  ? Potassium 3.1 (L) 3.5 - 5.1 mmol/L  ? Chloride 78 (L) 98 - 111 mmol/L  ? BUN 24 (H) 6 - 20 mg/dL  ? Creatinine, Ser 0.90 0.44 - 1.00 mg/dL  ? Glucose, Bld 155 (H) 70 - 99 mg/dL  ? Calcium, Ion 1.04 (L) 1.15 - 1.40 mmol/L  ? TCO2 50 (H) 22 - 32 mmol/L  ? Hemoglobin 9.2 (L) 12.0 - 15.0 g/dL   ? HCT 27.0 (L) 36.0 - 46.0 %  ?Basic metabolic panel     Status: Abnormal  ? Collection Time: 05/30/21  6:45 PM  ?Result Value Ref Range  ? Sodium 138 135 - 145 mmol/L  ? Potassium 3.4 (L) 3.5 - 5.1 mmol/L  ? Chloride 82 (L) 98 - 111 mmol/L  ? CO2 >45 (H) 22 - 32 mmol/L  ? Glucose, Bld 130 (H) 70 - 99 mg/dL  ? BUN 22 (H) 6 - 20 mg/dL  ? Creatinine, Ser 0.64 0.44 - 1.00 mg/dL  ? Calcium 8.4 (L) 8.9 - 10.3 mg/dL  ? GFR, Estimated >60 >60 mL/min  ? Anion gap NOT CALCULATED 5 - 15  ?Glucose, capillary     Status: Abnormal  ? Collection Time: 05/30/21  7:26 PM  ?Result Value Ref Range  ? Glucose-Capillary 128 (H) 70 - 99 mg/dL  ?Glucose, capillary     Status: Abnormal  ? Collection Time: 05/30/21 11:17 PM  ?Result Value Ref Range  ? Glucose-Capillary 121 (H) 70 - 99 mg/dL  ?Glucose, capillary     Status: Abnormal  ? Collection Time: 05/31/21  3:21 AM  ?Result Value Ref Range  ? Glucose-Capillary 125 (H) 70 - 99 mg/dL  ?I-STAT 7, (LYTES, BLD GAS, ICA, H+H)     Status: Abnormal  ?  Collection Time: 05/31/21  4:53 AM  ?Result Value Ref Range  ? pH, Arterial 7.476 (H) 7.35 - 7.45  ? pCO2 arterial 55.7 (H) 32 - 48 mmHg  ? pO2, Arterial 102 83 - 108 mmHg  ? Bicarbonate 41.2 (H) 20.0 - 28.0 mmol/L  ? TCO2 43 (H) 22 - 32 mmol/L  ? O2 Saturation 98 %  ? Acid-Base Excess 16.0 (H) 0.0 - 2.0 mmol/L  ? Sodium 137 135 - 145 mmol/L  ? Potassium 2.7 (LL) 3.5 - 5.1 mmol/L  ? Calcium, Ion 1.05 (L) 1.15 - 1.40 mmol/L  ? HCT 26.0 (L) 36.0 - 46.0 %  ? Hemoglobin 8.8 (L) 12.0 - 15.0 g/dL  ? Patient temperature 97.8 F   ? Collection site art line   ? Drawn by RT   ? Sample type ARTERIAL   ? Comment NOTIFIED PHYSICIAN   ?CBC     Status: Abnormal  ? Collection Time: 05/31/21  5:38 AM  ?Result Value Ref Range  ? WBC 14.4 (H) 4.0 - 10.5 K/uL  ? RBC 2.93 (L) 3.87 - 5.11 MIL/uL  ? Hemoglobin 8.5 (L) 12.0 - 15.0 g/dL  ? HCT 26.5 (L) 36.0 - 46.0 %  ? MCV 90.4 80.0 - 100.0 fL  ? MCH 29.0 26.0 - 34.0 pg  ? MCHC 32.1 30.0 - 36.0 g/dL  ? RDW 14.1 11.5 -  15.5 %  ? Platelets 394 150 - 400 K/uL  ? nRBC 0.0 0.0 - 0.2 %  ?Comprehensive metabolic panel     Status: Abnormal  ? Collection Time: 05/31/21  5:38 AM  ?Result Value Ref Range  ? Sodium 140 135 - 145 mmol/L  ? Potassium 2.7 (LL) 3.5 - 5.1 mmol/L  ? Chloride 87 (L) 98 - 111 mmol/L  ? CO2 41 (H) 22 - 32 mmol/L  ? Glucose, Bld 155 (H) 70 - 99 mg/dL  ? BUN 24 (H) 6 - 20 mg/dL  ? Creatinine, Ser 0.80 0.44 - 1.00 mg/dL  ? Calcium 8.5 (L) 8.9 - 10.3 mg/dL  ? Total Protein 7.2 6.5 - 8.1 g/dL  ? Albumin 2.1 (L) 3.5 - 5.0 g/dL  ? AST 34 15 - 41 U/L  ? ALT 65 (H) 0 - 44 U/L  ? Alkaline Phosphatase 254 (H) 38 - 126 U/L  ? Total Bilirubin 0.6 0.3 - 1.2 mg/dL  ? GFR, Estimated >60 >60 mL/min  ? Anion gap 12 5 - 15  ?Magnesium     Status: None  ? Collection Time: 05/31/21  5:38 AM  ?Result Value Ref Range  ? Magnesium 2.2 1.7 - 2.4 mg/dL  ?Phosphorus     Status: Abnormal  ? Collection Time: 05/31/21  5:38 AM  ?Result Value Ref Range  ? Phosphorus 4.7 (H) 2.5 - 4.6 mg/dL  ?Glucose, capillary     Status: Abnormal  ? Collection Time: 05/31/21  7:32 AM  ?Result Value Ref Range  ? Glucose-Capillary 191 (H) 70 - 99 mg/dL  ? ? ?Assessment & Plan: ?The plan of care was discussed with the bedside nurse for the day, who is in agreement with this plan and no additional concerns were raised.  ? ?Present on Admission: ? Neuroendocrine carcinoma ([place]) ? ? ? LOS: 26 days  ? ?Additional comments:I reviewed the patient's new clinical lab test results.   and I reviewed the patients new imaging test results.   ? ?Neuroendocrine tumor on colonoscopy - s/p exlap, R hemicolectomy, cholecystectomy 3/9. Well differentiated neuroendocrine tumor with 1/7 +  nodes. D/w Dr. Marin Olp 3/15 will likely need adjuvant chemo. Staples out 3/24. Bottom 2cm of wound packed. ?Respiratory failure 2/2 volume overload/?PNA - CT chest 3/11 and 3/13 with no PE. Intubated 3/16, suspect PNA based on CXR but negative resp cx and BAL, s/p 7d empiric merrem. Decompensation  3/26, likely will need trach when settings lower, d/w husband. Discussed this patient with pulmonology 3/29 AM, recommend continuing current care, no abx unless organism identified. Repeat pulm c/s 4/1, brovana, yupelri, singulair added, holding off on systemic steroids given improvement over the last couple of days. Plan for trach today ?Volume overload - previously resolved, but 3/24 PM, went into flash pulmonary edema. Cont lasix gtt to 8 due to sedation causing increased intake. Tol valium tfor decr drip reqts. Echo 3/28, essentially normal.  ?Shock - resolved ?Altered mental status - CT head 3/13, 3/16/ 3/17 normal (h/o VP shunt). Changed cefepime to zosyn 3/13, total 7d course-end today, empiric coverage for PNA. Resp cx today. Ucx 3/13 negative. Bcx 3/15 NGTD. LP 3/16 NGTD. CT A/P 3/16 negative. 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 Neurology on board, changed to keppra, cont EEG without sz, encephalopathy much improved  ?Diarrhea - d/c'd imodium to encourage NH4 loss, restarted 3/25, incr to TID, cont cholestyramine TID, added lomotil ?vWD - humate P per Heme, end 3/12, stable ?Transaminitis - likely 2/2 TPN, now d/c'd, recheck 4/3 ?Leukocytosis, fever - 14 this AM, infectious workup unrevealing, curbside ID consult 3/28 with no add'l recs, LE duplex negative 3/31 ?FEN - NPO, OGT, tol TF at goal, PA 16, d/w nutrition with RD and incr TF to 65/h, replete K ?ABLA - stable ?DVT - SCDs, s/p DDVAP, LMWH, LE duplex neg for DVT 3/13 ?Foley - replaced 3/18, d/c'd 3/24, replaced 3/25, cont for accurate I/O on lasix gtt ?Dispo - ICU, PT/OT/SLP    ? ?Critical Care Total Time: 35 minutes ? ?[name], MD ?Trauma & General Surgery ?Please use [email or web address] to contact on call provider ? ?05/31/2021 ? ?*Care during the described time interval was provided by me. I have reviewed this patient's available data, including medical history, events of note, physical examination and test results as part of  my evaluation. ? ? ? ?

## 2021-05-31 NOTE — Progress Notes (Signed)
K+ came back at 3.2 this PM post replacement this AM. Ok for KCL 20mq x1 per Dr. LBobbye Morton  ? ?MOnnie Boer PharmD, BCIDP, AAHIVP, CPP ?Infectious Disease Pharmacist ?05/31/2021 7:17 PM ? ? ?

## 2021-05-31 NOTE — Progress Notes (Signed)
eLink Physician-Brief Progress Note ?Patient Name: Cassidy Tran ?DOB: Apr 26, 1975 ?MRN: [id number] ? ? ?Date of Service ? 05/31/2021  ?HPI/Events of Note ? ABG on 50%/PRVC 20/TV 420/P 8 = 7.476/55.7/102/ 41.2.  ?eICU Interventions ? Continue present ventilator management.   ? ? ? ?Intervention Category ?Major Interventions: Respiratory failure - evaluation and management ? ?Aman Batley Cornelia Copa ?05/31/2021, 5:12 AM ?

## 2021-05-31 NOTE — Progress Notes (Signed)
Patient complaining of persistent abdominal pain and new nausea. Notified MD Lovick. Verbal order received to hold tube feeds until tomorrow morning. ?

## 2021-05-31 NOTE — Progress Notes (Signed)
[facility] ADULT ICU REPLACEMENT PROTOCOL ? ? ?The patient does apply for the [facility] Adult ICU Electrolyte Replacment Protocol based on the criteria listed below:  ? ?1.Exclusion criteria: TCTS patients, ECMO patients, and Dialysis patients ?2. Is GFR >/= 30 ml/min? Yes.    ?Patient's GFR today is >60 ?3. Is SCr </= 2? Yes.   ?Patient's SCr is 0.80 mg/dL ?4. Did SCr increase >/= 0.5 in 24 hours? No. ?5.Pt's weight >40kg  Yes.   ?6. Abnormal electrolyte(s): K+ 2.7  ?7. Electrolytes replaced per protocol ?8.  Call MD STAT for K+ </= 2.5, Phos </= 1, or Mag </= 1 ?Physician:  Oletta Darter ? ?Cassidy Tran 05/31/2021 6:53 AM  ?

## 2021-06-01 DIAGNOSIS — J449 Chronic obstructive pulmonary disease, unspecified: Secondary | ICD-10-CM

## 2021-06-01 DIAGNOSIS — C7A8 Other malignant neuroendocrine tumors: Secondary | ICD-10-CM | POA: Diagnosis not present

## 2021-06-01 DIAGNOSIS — L8990 Pressure ulcer of unspecified site, unspecified stage: Secondary | ICD-10-CM | POA: Insufficient documentation

## 2021-06-01 DIAGNOSIS — Z930 Tracheostomy status: Secondary | ICD-10-CM | POA: Diagnosis not present

## 2021-06-01 LAB — COMPREHENSIVE METABOLIC PANEL
ALT: 42 U/L (ref 0–44)
AST: 21 U/L (ref 15–41)
Albumin: 2 g/dL — ABNORMAL LOW (ref 3.5–5.0)
Alkaline Phosphatase: 236 U/L — ABNORMAL HIGH (ref 38–126)
Anion gap: 8 (ref 5–15)
BUN: 22 mg/dL — ABNORMAL HIGH (ref 6–20)
CO2: 32 mmol/L (ref 22–32)
Calcium: 8.5 mg/dL — ABNORMAL LOW (ref 8.9–10.3)
Chloride: 98 mmol/L (ref 98–111)
Creatinine, Ser: 0.8 mg/dL (ref 0.44–1.00)
GFR, Estimated: >60 mL/min (ref >60)
Glucose, Bld: 132 mg/dL — ABNORMAL HIGH (ref 70–99)
Potassium: 3.8 mmol/L (ref 3.5–5.1)
Sodium: 138 mmol/L (ref 135–145)
Total Bilirubin: 0.5 mg/dL (ref 0.3–1.2)
Total Protein: 6.9 g/dL (ref 6.5–8.1)

## 2021-06-01 LAB — GLUCOSE, CAPILLARY
Glucose-Capillary: 131 mg/dL — ABNORMAL HIGH (ref 70–99)
Glucose-Capillary: 126 mg/dL — ABNORMAL HIGH (ref 70–99)
Glucose-Capillary: 142 mg/dL — ABNORMAL HIGH (ref 70–99)
Glucose-Capillary: 134 mg/dL — ABNORMAL HIGH (ref 70–99)
Glucose-Capillary: 126 mg/dL — ABNORMAL HIGH (ref 70–99)
Glucose-Capillary: 130 mg/dL — ABNORMAL HIGH (ref 70–99)

## 2021-06-01 LAB — CBC
HCT: 27.7 % — ABNORMAL LOW (ref 36.0–46.0)
Hemoglobin: 8.7 g/dL — ABNORMAL LOW (ref 12.0–15.0)
MCH: 28.9 pg (ref 26.0–34.0)
MCHC: 31.4 g/dL (ref 30.0–36.0)
MCV: 92 fL (ref 80.0–100.0)
Platelets: 416 10*3/uL — ABNORMAL HIGH (ref 150–400)
RBC: 3.01 MIL/uL — ABNORMAL LOW (ref 3.87–5.11)
RDW: 14.3 % (ref 11.5–15.5)
WBC: 15.5 10*3/uL — ABNORMAL HIGH (ref 4.0–10.5)
nRBC: 0 % (ref 0.0–0.2)

## 2021-06-01 MED ORDER — FUROSEMIDE 10 MG/ML IJ SOLN
40.0000 mg | Freq: Once | INTRAMUSCULAR | Status: AC
  Administered 2021-06-01: 40 mg via INTRAVENOUS
  Filled 2021-06-01: qty 4

## 2021-06-01 NOTE — Progress Notes (Signed)
RT Note:  Patient continues on 40% ATC. Tolerating well overnight.  ?

## 2021-06-01 NOTE — Progress Notes (Addendum)
? ?NAME:  Cassidy Tran, MRN:  [id number], DOB:  [birth year], LOS: 90 ?ADMISSION DATE:  05/05/2021, CONSULTATION DATE:  05/28/21 ?REFERRING MD:  Bobbye Morton, CHIEF COMPLAINT:  acute hypoxic respiratory failure ? ?History of Present Illness:  ?46 yo female smoker with neuroendocrine tumor of the terminal ileum presented for planned cholecystectomy with Rt hemicolectomy which was performed on 05/05/21.  Post op course complicated by HCAP with hypoxia requiring intubation and AMS from elevated ammonia 2nd to seizure medications. ? ?Pertinent  Medical History  ?Asthma, Epilepsy, Chiari malformation s/p VPS, Epilepsy, Von Willebrand disease, Biliary dyskinesia, Neuroendocrine tumor ? ?Significant Hospital Events: ?Including procedures, antibiotic start and stop dates in addition to other pertinent events   ?3/09 ex lap, R hemicolectomy, chole ?3/10 oncology consulted ?3/11 respiratory distress, transfer to ICU, start cefepime ?3/13 concern for AMS due to hyperammonemia ?3/15 neurosurgery consult to determine if issue with VP shunt contributing to AMS ?3/16 intubation, neurology consulted, LP performed ?3/21 bronchoscopy ?3/26 ETT exchange ?4/01 bronchoscopy ?4/03 tracheostomy ?4/04 off vent ? ?Studies:  ?CT angio chest 3/13 >> extensive GGO and interlobular septal thickening ?CT chest 3/27 >> stable multiple airspace opacities ?Echo 3/28 >> EF 60 to 65%, mod LVH ?Doppler legs b/l 3/31 >> no DVT ? ?Interim History / Subjective:  ?She has been off the vent since yesterday.  Remains on lasix gtt and precedex. ? ?Objective   ?Blood pressure (!) 108/50, pulse 69, temperature 99.7 ?F (37.6 ?C), temperature source Axillary, resp. rate (!) 22, height '5\' 3"'$  (1.6 m), weight 84.4 kg, last menstrual period 09/16/2012, SpO2 94 %. ?   ?Vent Mode: Stand-by ?FiO2 (%):  [40 %] 40 % ?PEEP:  [5 cmH20] 5 cmH20 ?Pressure Support:  [10 cmH20] 10 cmH20  ? ?Intake/Output Summary (Last 24 hours) at 06/01/2021 0905 ?Last data filed at 06/01/2021 0800 ?Gross  per 24 hour  ?Intake 1607.16 ml  ?Output 3045 ml  ?Net -1437.84 ml  ? ?Filed Weights  ? 05/30/21 0500 05/31/21 0500 05/31/21 1600  ?Weight: 90.2 kg 89.9 kg 84.4 kg  ? ? ?Examination: ? ?General - sleepy ?Eyes - pupils reactive ?ENT - trach site clean ?Cardiac - regular rate/rhythm, no murmur ?Chest - equal breath sounds b/l, no wheezing or rales ?Abdomen - soft, non tender, + bowel sounds ?Extremities - no cyanosis, clubbing, or edema ?Skin - no rashes ?Neuro - RASS -1 ? ?Resolved Hospital Problem list   ? ? ?Assessment & Plan:  ? ?Acute hypoxic respiratory failure 2nd to HCAP. ?Hx of asthma, tobacco abuse. ?Failure to wean from ventilator s/p tracheostomy on 4/03. ?- completed ABx 3/27 ?- trach collar as tolerated with prn vent support  ?- goal SpO2 > 92% ?- f/u CXR intermittently ?- continue yupelri, brovana, pulmicort, singulair ?- prn albuterol ?- f/u with speech for PM valve and swallow assessment ? ?Metabolic alkalosis with hypokalemia. ?- likely from over diuresis ?- primary team managing diuretics and electrolytes ? ?S/p laparotomy with Rt hemicolectomy and cholecystectomy on 3/09. ?- post op care, nutrition per surgery ? ?Acute metabolic encephalopathy 2nd to hypoxia, elevated ammonia. ?Hx of Chiari malformation with VP shunt. ?Epilepsy. ?- seen by neurosurgery and by neurology ?- hopefully can wean of precedex soon >> defer plan to primary team ? ?Neuroendocrine tumor of terminal ileum. ?Von Willebrand Disease. ?- seen by Dr. Marin Olp with Hematology/Oncology ? ?Anemia of critical illness. ?- f/u CBC ?- transfuse for Hb < 7 ? ?Pressure injury. ?- Lt heal, stage 1, no present on admission ?- wound care ? ?  Deconditioning. ?- PT consulted ? ?At pt's husband's request, PCCM will continue to follow for asthma/COPD management.  She will need outpt pulmonary follow up. ? ?Updated pt's husband at bedside. ? ?Best Practice (right click and "Reselect all SmartList Selections" daily)  ? ?DVT: lovenox ?SUP:  protonix ?Nutrition: tube feeds ?Lines: Rt PICC 3/27, Rt femoral a line 3/28 ?Code: full code ? ?Labs:  ? ? ?  Latest Ref Rng & Units 06/01/2021  ?  5:56 AM 05/31/2021  ?  4:09 PM 05/31/2021  ?  5:38 AM  ?CMP  ?Glucose 70 - 99 mg/dL 132   125   155    ?BUN 6 - 20 mg/dL '22   22   24    '$ ?Creatinine 0.44 - 1.00 mg/dL 0.80   0.68   0.80    ?Sodium 135 - 145 mmol/L 138   142   140    ?Potassium 3.5 - 5.1 mmol/L 3.8   3.2   2.7    ?Chloride 98 - 111 mmol/L 98   95   87    ?CO2 22 - 32 mmol/L 32   36   41    ?Calcium 8.9 - 10.3 mg/dL 8.5   7.9   8.5    ?Total Protein 6.5 - 8.1 g/dL 6.9    7.2    ?Total Bilirubin 0.3 - 1.2 mg/dL 0.5    0.6    ?Alkaline Phos 38 - 126 U/L 236    254    ?AST 15 - 41 U/L 21    34    ?ALT 0 - 44 U/L 42    65    ? ? ? ?  Latest Ref Rng & Units 06/01/2021  ?  5:56 AM 05/31/2021  ?  5:38 AM 05/31/2021  ?  4:53 AM  ?CBC  ?WBC 4.0 - 10.5 K/uL 15.5   14.4     ?Hemoglobin 12.0 - 15.0 g/dL 8.7   8.5   8.8    ?Hematocrit 36.0 - 46.0 % 27.7   26.5   26.0    ?Platelets 150 - 400 K/uL 416   394     ? ? ?ABG ?   ?Component Value Date/Time  ? PHART 7.476 (H) 05/31/2021 0453  ? PCO2ART 55.7 (H) 05/31/2021 0453  ? PO2ART 102 05/31/2021 0453  ? HCO3 41.2 (H) 05/31/2021 0453  ? TCO2 43 (H) 05/31/2021 0453  ? O2SAT 98 05/31/2021 0453  ? ? ?CBG (last 3)  ?Recent Labs  ?  05/31/21 ?2340 06/01/21 ?0327 06/01/21 ?0735  ?GLUCAP 122* 131* 126*  ? ? ?Signature:  ?Chesley Mires, MD ?[place] ?Pager - [phone] ?06/01/2021, 9:05 AM ? ? ? ?  ?

## 2021-06-01 NOTE — Progress Notes (Signed)
OT Cancellation Note ? ?Patient Details ?Name: Cassidy Tran ?MRN: [id number] ?DOB: 03-21-1975 ? ? ?Cancelled Treatment:    Reason Eval/Treat Not Completed: Fatigue/lethargy limiting ability to participate. Patient kept at bedlevel this date, PT performed bedlevel eval, OT will defer, and try again tomorrow for OOB advancement.   ? ?Janice Coffin Deboraha Goar ?06/01/2021, 1:54 PM ?

## 2021-06-01 NOTE — Evaluation (Signed)
Physical Therapy Evaluation ?Patient Details ?Name: Cassidy Tran ?MRN: [id number] ?DOB: 06-05-1975 ?Today's Date: 06/01/2021 ? ?History of Present Illness ? 46 y.o. female presents to [facility] hospital on 05/05/2021 and underwent ex-lap with cholecystectomy and R hemicolectomy for neuroendocrine tumor. Pt developed respiratory failure due to volume overload and required intubation on 3/16. Pt developed AMS during admission with rising white cell counts, underwent lumbar punture on 3/16. Pt underwent bronchoscopy on 3/21. Bronchoscopy 4/1, tracheostomy 4/3. PMH includes ASCUS, clotting disorder, seizures, PNA, Von Willebrand disease, VP shunt placement.  ?Clinical Impression ? Pt presents to PT with deficits in strength, endurance, functional mobility, communication, cognition. Pt fatigues quickly and demonstrates profound weakness compared to baseline. PT defers bed mobility due to nursing request, with goal to initiate next session if pt continues to tolerate trach collar well. PT provides family education on HEP and assisting the patient due to weakness and fatig\ability. PT recommends SNF placement at this time. ?   ? ?Recommendations for follow up therapy are one component of a multi-disciplinary discharge planning process, led by the attending physician.  Recommendations may be updated based on patient status, additional functional criteria and insurance authorization. ? ?Follow Up Recommendations Skilled nursing-short term rehab (<3 hours/day) ? ?  ?Assistance Recommended at Discharge Frequent or constant Supervision/Assistance  ?Patient can return home with the following ? Two people to help with walking and/or transfers;Two people to help with bathing/dressing/bathroom;Assistance with cooking/housework;Assistance with feeding;Direct supervision/assist for medications management;Direct supervision/assist for financial management;Assist for transportation;Help with stairs or ramp for entrance ? ?  ?Equipment  Recommendations Wheelchair (measurements PT);Wheelchair cushion (measurements PT);Hospital bed;Other (comment) (hoyer lift)  ?Recommendations for Other Services ?    ?  ?Functional Status Assessment Patient has had a recent decline in their functional status and demonstrates the ability to make significant improvements in function in a reasonable and predictable amount of time.  ? ?  ?Precautions / Restrictions Precautions ?Precautions: Other (comment) ?Precaution Comments: trach collar ?Restrictions ?Weight Bearing Restrictions: No  ? ?  ? ?Mobility ? Bed Mobility ?Overal bed mobility:  (deferred due to fatigue) ?  ?  ?  ?  ?  ?  ?  ?  ? ?Transfers ?  ?  ?  ?  ?  ?  ?  ?  ?  ?  ?  ? ?Ambulation/Gait ?  ?  ?  ?  ?  ?  ?  ?  ? ?Stairs ?  ?  ?  ?  ?  ? ?Wheelchair Mobility ?  ? ?Modified Rankin (Stroke Patients Only) ?  ? ?  ? ?Balance   ?  ?  ?  ?  ?  ?  ?  ?  ?  ?  ?  ?  ?  ?  ?  ?  ?  ?  ?   ? ? ? ?Pertinent Vitals/Pain Pain Assessment ?Pain Assessment: 0-10 ?Pain Score: 8  ?Pain Location: abdomen ?Pain Descriptors / Indicators: Grimacing ?Pain Intervention(s): Monitored during session  ? ? ?Home Living Family/patient expects to be discharged to:: Private residence ?Living Arrangements: Spouse/significant other ?Available Help at Discharge: Family;Available PRN/intermittently ?Type of Home: House ?  ?  ?  ?  ?  ?Home Equipment: None ?Additional Comments: pt lives with husband, parents nearby. Pt works in [employer] and loves her job. Has a dog named Yogi.  ?  ?Prior Function Prior Level of Function : Independent/Modified Independent;Working/employed ?  ?  ?  ?  ?  ?  ?  ?  ?  ? ? ?  Hand Dominance  ?   ? ?  ?Extremity/Trunk Assessment  ? Upper Extremity Assessment ?Upper Extremity Assessment: Generalized weakness ?  ? ?Lower Extremity Assessment ?Lower Extremity Assessment: RLE deficits/detail;LLE deficits/detail ?RLE Deficits / Details: 2/5 knee flexion/extension, 2/5 ankle PF, 1/5 ankle DF ?LLE Deficits /  Details: grossly 3/5 ?  ? ?Cervical / Trunk Assessment ?Cervical / Trunk Assessment: Kyphotic  ?Communication  ? Communication: Tracheostomy (pt communicating via guestures, mouthing words, attempts to write)  ?Cognition Arousal/Alertness: Awake/alert ?Behavior During Therapy: Flat affect ?Overall Cognitive Status: Difficult to assess ?Area of Impairment: Problem solving ?  ?  ?  ?  ?  ?  ?  ?  ?  ?  ?  ?  ?  ?  ?Problem Solving: Slow processing ?  ?  ?  ? ?  ?General Comments General comments (skin integrity, edema, etc.): pt on 10L 40% FiO2 trach collar, VSS with exercise ? ?  ?Exercises General Exercises - Lower Extremity ?Ankle Circles/Pumps: AROM, Left, 5 reps, PROM, Right ?Gluteal Sets: AROM, Both, 5 reps ?Short Arc Quad: AROM, Left, 5 reps, AAROM, Right ?Heel Slides: AROM, Left, 5 reps, AAROM, Right ?Hip ABduction/ADduction: AAROM, Both, 5 reps  ? ?Assessment/Plan  ?  ?PT Assessment Patient needs continued PT services  ?PT Problem List Decreased strength;Decreased activity tolerance;Decreased balance;Decreased mobility;Decreased range of motion;Decreased cognition;Decreased knowledge of use of DME;Cardiopulmonary status limiting activity;Pain;Decreased safety awareness;Decreased knowledge of precautions ? ?   ?  ?PT Treatment Interventions DME instruction;Gait training;Functional mobility training;Therapeutic activities;Therapeutic exercise;Balance training;Neuromuscular re-education;Cognitive remediation;Patient/family education;Wheelchair mobility training   ? ?PT Goals (Current goals can be found in the Care Plan section)  ?Acute Rehab PT Goals ?Patient Stated Goal: to improve strength and reduce caregiver burden ?PT Goal Formulation: With patient/family ?Time For Goal Achievement: 06/15/21 ?Potential to Achieve Goals: Fair ? ?  ?Frequency Min 3X/week ?  ? ? ?Co-evaluation   ?  ?  ?  ?  ? ? ?  ?AM-PAC PT "6 Clicks" Mobility  ?Outcome Measure Help needed turning from your back to your side while in a flat  bed without using bedrails?: Total ?Help needed moving from lying on your back to sitting on the side of a flat bed without using bedrails?: Total ?Help needed moving to and from a bed to a chair (including a wheelchair)?: Total ?Help needed standing up from a chair using your arms (e.g., wheelchair or bedside chair)?: Total ?Help needed to walk in hospital room?: Total ?Help needed climbing 3-5 steps with a railing? : Total ?6 Click Score: 6 ? ?  ?End of Session Equipment Utilized During Treatment: Oxygen ?Activity Tolerance: Patient limited by fatigue ?Patient left: in bed;with call bell/phone within reach;with bed alarm set;with family/visitor present ?Nurse Communication: Mobility status;Need for lift equipment ?PT Visit Diagnosis: Muscle weakness (generalized) (M62.81);Pain;Other abnormalities of gait and mobility (R26.89) ?Pain - part of body:  (abdomen) ?  ? ?Time: 4944-9675 ?PT Time Calculation (min) (ACUTE ONLY): 26 min ? ? ?Charges:   PT Evaluation ?$PT Eval High Complexity: 1 High ?  ?  ?   ? ? ?Zenaida Niece, PT, DPT ?Acute Rehabilitation ?Pager: [phone] ?Office [phone] ? ? ?Zenaida Niece ?06/01/2021, 12:51 PM ? ?

## 2021-06-01 NOTE — Progress Notes (Signed)
Nutrition Follow-up ? ?DOCUMENTATION CODES:  ? ?Obesity unspecified ? ?INTERVENTION:  ? ?Continue tube feeding via PEG tube: (advance to goal as tolerated)  ?- Vital 1.5 @ 65 ml/hr (1560 ml/day) ?- ProSource TF 45 ml daily ? ?Tube feeding regimen provides 2380 kcal, 116 grams of protein, and 1192 ml of H2O.  ? ?NUTRITION DIAGNOSIS:  ? ?Increased nutrient needs related to acute illness as evidenced by estimated needs. ? ?Ongoing, being addressed via TF ? ?GOAL:  ? ?Patient will meet greater than or equal to 90% of their needs ? ?Met via TF ? ?MONITOR:  ? ?TF tolerance, Diet advancement, Skin ? ?REASON FOR ASSESSMENT:  ? ?Other (Cortrak) ?  ? ?ASSESSMENT:  ? 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 PMH significant for type IA von Willebrand disease. ? ?Pt discussed during ICU rounds and with RN. Pt on trach collar >24 hours. TF stopped 4/4 due to nausea. Resumed at 1/2 rate 4/5.  ?Spoke with pt who indicates that no nausea currently but at times has sharp abd pains.  ? ? ?03/09 - s/p ex-lap, R hemicolectomy, cholecystectomy for well differentiated neuroendocrine turmor with 1/7 + nodes ?03/15 - s/p Cortrak placement (tip gastric), started TF, Cortrak removed later that night ?03/16 - intubated, OG tube placed  ?03/17 - developed emesis, TF held ?03/18 - TPN initiated ?03/20 - TF resumed and advanced to goal ?04/03 - s/p trach and PEG ?04/04 - TF held for nausea/abd pain ?04/05 - TF restarted at 1/2 rate ? ? ? ?Medications reviewed and include: questran 4 grams TID, lomotil, SSI q 4 hours, imodium 4 mg TID, protonix ?Precedex ? ?Labs reviewed: PO4: 4.7 ?CBG's: 509-326 ? ?UOP: 3020 ml x 24 hours ?Rectal tube: 400 ml x 24 hours ?I/O's: -148 mL since admit ? ?Diet Order:   ?Diet Order   ? ?       ?  Diet NPO time specified  Diet effective now       ?  ? ?  ?  ? ?  ? ? ?EDUCATION NEEDS:  ? ?Education needs have been addressed ? ?Skin:  Skin Assessment: ?Skin Integrity Issues: ?Incisions: abdomen  (closed) ?Other: MASD anus ?Stage 1: L heel  ? ?Last BM:  400 ml x 24 hr via rectal tube ? ?Height:  ? ?Ht Readings from Last 1 Encounters:  ?05/12/21 '5\' 3"'  (1.6 m)  ? ? ?Weight:  ? ?Wt Readings from Last 1 Encounters:  ?05/31/21 84.4 kg  ? ? ?Ideal Body Weight:  52.3 kg ? ?BMI:  Body mass index is 32.96 kg/m?. ? ?Estimated Nutritional Needs:  ? ?Kcal:  2200-2400 ? ?Protein:  110-130 grams ? ?Fluid:  >/= 2.0 L ? ?Lockie Pares., RD, LDN, CNSC ?See AMiON for contact information  ? ? ?

## 2021-06-01 NOTE — Evaluation (Signed)
[facility] Speaking [facility] - Evaluation ?Patient Details  ?Name: Cassidy Tran ?MRN: [id number] ?Date of Birth: [birth year] ? ?Today's Date: 06/01/2021 ?Time: 1000-1010 ?SLP Time Calculation (min) (ACUTE ONLY): 10 min ? ?Past Medical History:  ?Past Medical History:  ?Diagnosis Date  ? Acid reflux   ? Allergy   ? Anxiety   ? Arthritis   ? lower back  ? ASCUS (atypical squamous cells of undetermined significance) on Pap smear 03/2011  ? NEG HR HPV  ? Asthma   ? Cancer [facility])   ? skin cancer- age 27ish  ? Cervical dysplasia, mild 08/2010  ? LGSIL colposcopy biopsy showing koilocytotic atypia  ? Clotting disorder ([place])   ? Depression   ? Endometriosis   ? Headache(784.0)   ? High risk HPV infection 12/2011  ? Pap normal  ? Hypertension   ? Pneumonia   ? 2016ish  ? Seizures ([place])   ? seizure disorder  ? Smoker   ? Von Willebrand disease ([place])   ? ?Past Surgical History:  ?Past Surgical History:  ?Procedure Laterality Date  ? APPLICATION OF CRANIAL NAVIGATION N/A 11/24/2016  ? Procedure: APPLICATION OF CRANIAL NAVIGATION;  Surgeon: Kristeen Miss, MD;  Location: [place];  Service: Neurosurgery;  Laterality: N/A;  ? BRAIN SURGERY    ? BREAST BIOPSY Left   ? CHOLECYSTECTOMY N/A 05/05/2021  ? Procedure: CHOLECYSTECTOMY;  Surgeon: [name], MD;  Location: [place];  Service: General;  Laterality: N/A;  ? COLON RESECTION N/A 05/05/2021  ? Procedure: RIGHT HEMICOLECTOMY;  Surgeon: [name], MD;  Location: [place];  Service: General;  Laterality: N/A;  ? COLPOSCOPY    ? CYSTOSCOPY N/A 10/14/2012  ? Procedure: CYSTOSCOPY;  Surgeon: [name], MD;  Location: [facility];  Service: Gynecology;  Laterality: N/A;  ? DILATION AND CURETTAGE OF UTERUS    ? HYSTEROSCOPY WITH D & C  10/14/2010  ? Procedure: DILATATION AND CURETTAGE (D&C) /HYSTEROSCOPY;  Surgeon: [name], MD;  Location: [facility];  Service: Gynecology;  Laterality: N/A;  ? LAPAROSCOPIC HYSTERECTOMY N/A 10/14/2012  ? Procedure: HYSTERECTOMY TOTAL LAPAROSCOPIC;   Surgeon: [name], MD;  Location: [facility];  Service: Gynecology;  Laterality: N/A;  CPT [id number] ? ?2 1/2 hours ? ?Dr. Uvaldo Rising to assist.  ? LAPAROSCOPY  04/27/2011  ? Procedure: LAPAROSCOPY OPERATIVE;  Surgeon: [name], MD;  Location: [facility];  Service: Gynecology;  Laterality: N/A;  removal right cyst , lysis of adhesions, biopsy of peritoneum  ? LAPAROTOMY N/A 05/05/2021  ? Procedure: EXPLORATORY LAPAROTOMY;  Surgeon: [name], MD;  Location: [facility];  Service: General;  Laterality: N/A;  ? [place]  ? IN [place], [place]  ? PEG PLACEMENT N/A 05/30/2021  ? Procedure: PERCUTANEOUS ENDOSCOPIC GASTROSTOMY (PEG) PLACEMENT;  Surgeon: [name], MD;  Location: [place];  Service: General;  Laterality: N/A;  ? SUBOCCIPITAL CRANIECTOMY CERVICAL LAMINECTOMY N/A 11/06/2016  ? Procedure: Suboccipital decompression for chiari malformation;  Surgeon: Kristeen Miss, MD;  Location: [facility] [facility];  Service: Neurosurgery;  Laterality: N/A;  ? TONSILLECTOMY    ? TRACHEOSTOMY TUBE PLACEMENT N/A 05/30/2021  ? Procedure: TRACHEOSTOMY;  Surgeon: [name], MD;  Location: [facility];  Service: General;  Laterality: N/A;  ? VENTRICULOPERITONEAL SHUNT N/A 11/24/2016  ? Procedure: SHUNT INSERTION VENTRICULAR-PERITONEAL with Brainlab;  Surgeon: Kristeen Miss, MD;  Location: [facility];  Service: Neurosurgery;  Laterality: N/A;  right side approach  ? [place] EXTRACTION  1993  ? ?  HPI:  ?46 yo female smoker with neuroendocrine tumor of the terminal ileum presented for planned cholecystectomy with Rt hemicolectomy which was performed on 05/05/21.  Post op course complicated by HCAP with hypoxia requiring intubation and AMS from elevated ammonia 2nd to seizure medications. Trach and PEG placed 3/29.  Pt with prior medical history of Asthma, Epilepsy, Chiari malformation s/p VPS, Epilepsy, Von Willebrand disease, Biliary dyskinesia, Neuroendocrine tumor.  ? ? ?Assessment / Plan / Recommendation ? ?Clinical  Impression ? Pt is not medically ready for PMSV trial at this time.  Pt has been on TCT since mid afternoon yesterday. With RN approval, SLP deflated tracheostomy cuff.  On cuff deflation there was immediate strong cough. Secretions removed at tracheostomy and pt expectorated some secretions orally.  SpO2 dropped to 82 with somewhat slow recovery to 89-91.  Additional coughing episodes resulted in similar desaturation and slow recovery, with SpO2 setting around 84.  With trial of finger occlusion of trach, pt acheived weak phonation but was able to pass some air through upper respiratory tract.  Pt declined further trials.  Cuff was reinflated and SpO2 recovered to 89.  Provided education to pt and family about function and purpose of PMSV and why its use is contraindicated with inflated cuff. SLP to follow for tolerance of cuff deflation and readiness for PMV trials. Cough strength with oral expectoration of secretions and finger occlusion trial are encouraging signs for future PMV tolerance. ? ? ?SLP Visit Diagnosis: Aphonia (R49.1)  ?  ?SLP Assessment ? Patient needs continued Speech Lanaguage Pathology Services  ?  ?Recommendations for follow up therapy are one component of a multi-disciplinary discharge planning process, led by the attending physician.  Recommendations may be updated based on patient status, additional functional criteria and insurance authorization. ? ?Follow Up Recommendations ? Acute inpatient rehab (3hours/day) ? ?  ?Assistance Recommended at Discharge Frequent or constant Supervision/Assistance (at present)  ?Functional Status Assessment Patient has had a recent decline in their functional status and demonstrates the ability to make significant improvements in function in a reasonable and predictable amount of time.  ?Frequency and Duration min 2x/week  ?2 weeks ?  ? ?PMSV Trial PMSV was placed for: PMV not placed 2/2 poor tolerance of cuff deflation ?Able to redirect subglottic air  through upper airway: Yes (with thumb occlusion x1) ?Able to Attain Phonation: Yes (minimal) ?Voice Quality: Breathy;Low vocal intensity ?Able to Expectorate Secretions: Yes ?Level of Secretion Expectoration with PMSV: Tracheal;Oral ?Behavior: Alert;Controlled;Cooperative;Expresses self well;Good eye contact ?  ?Tracheostomy Tube ?   Shiley 6, cuffed ?  ?Vent Dependency ? FiO2 (%): 40 % Pt on TCT ?  ?Cuff Deflation Trial Tolerated Cuff Deflation: No ?Length of Time for Cuff Deflation Trial: 8 minutes ?Behavior: Alert;Controlled;Good eye contact;Expresses self well  ?   ? ? ?  ?Auna Mikkelsen E Haven Pylant, MA, CCC-SLP ?Acute Rehabilitation Services ?Office: [phone] ?06/01/2021, 10:25 AM ? ?

## 2021-06-01 NOTE — Progress Notes (Signed)
? ?Trauma/Critical Care Follow Up Note ? ?Subjective:  ?  ?Overnight Issues:  ? ?Objective:  ?Vital signs for last 24 hours: ?Temp:  [97.6 ?F (36.4 ?C)-99.7 ?F (37.6 ?C)] 99 ?F (37.2 ?C) (04/05 1200) ?Pulse Rate:  [68-87] 84 (04/05 1200) ?Resp:  [12-24] 19 (04/05 1200) ?BP: (95-112)/(41-68) 109/47 (04/05 1200) ?SpO2:  [90 %-99 %] 93 % (04/05 1200) ?Arterial Line BP: (113-123)/(52-53) 123/53 (04/04 1500) ?FiO2 (%):  [40 %] 40 % (04/05 1200) ?Weight:  [84.4 kg] 84.4 kg (04/04 1600) ? ?Hemodynamic parameters for last 24 hours: ?  ? ?Intake/Output from previous day: ?04/04 0701 - 04/05 0700 ?In: 1840.6 [I.V.:830.6; NG/GT:780; IV Piggyback:200] ?Out: 3151 [VOHYW:7371; Stool:400]  ?Intake/Output this shift: ?Total I/O ?In: 227.8 [I.V.:137.8; NG/GT:90] ?Out: 475 [Urine:475] ? ?Vent settings for last 24 hours: ?Vent Mode: Stand-by ?FiO2 (%):  [40 %] 40 % ? ?Physical Exam:  ?Gen: comfortable, no distress ?Neuro: non-focal exam, f/c ?HEENT: PERRL ?Neck: supple, trached ?CV: RRR ?Pulm: unlabored breathing on TC ?Abd: soft, NT, PEG in good position ?GU: clear yellow urine ?Extr: wwp, no edema ? ? ?Results for orders placed or performed during the hospital encounter of 05/05/21 (from the past 24 hour(s))  ?Glucose, capillary     Status: Abnormal  ? Collection Time: 05/31/21  4:07 PM  ?Result Value Ref Range  ? Glucose-Capillary 130 (H) 70 - 99 mg/dL  ?Basic metabolic panel     Status: Abnormal  ? Collection Time: 05/31/21  4:09 PM  ?Result Value Ref Range  ? Sodium 142 135 - 145 mmol/L  ? Potassium 3.2 (L) 3.5 - 5.1 mmol/L  ? Chloride 95 (L) 98 - 111 mmol/L  ? CO2 36 (H) 22 - 32 mmol/L  ? Glucose, Bld 125 (H) 70 - 99 mg/dL  ? BUN 22 (H) 6 - 20 mg/dL  ? Creatinine, Ser 0.68 0.44 - 1.00 mg/dL  ? Calcium 7.9 (L) 8.9 - 10.3 mg/dL  ? GFR, Estimated >60 >60 mL/min  ? Anion gap 11 5 - 15  ?Glucose, capillary     Status: Abnormal  ? Collection Time: 05/31/21  7:32 PM  ?Result Value Ref Range  ? Glucose-Capillary 224 (H) 70 - 99 mg/dL   ?Glucose, capillary     Status: Abnormal  ? Collection Time: 05/31/21 11:40 PM  ?Result Value Ref Range  ? Glucose-Capillary 122 (H) 70 - 99 mg/dL  ?Glucose, capillary     Status: Abnormal  ? Collection Time: 06/01/21  3:27 AM  ?Result Value Ref Range  ? Glucose-Capillary 131 (H) 70 - 99 mg/dL  ?CBC     Status: Abnormal  ? Collection Time: 06/01/21  5:56 AM  ?Result Value Ref Range  ? WBC 15.5 (H) 4.0 - 10.5 K/uL  ? RBC 3.01 (L) 3.87 - 5.11 MIL/uL  ? Hemoglobin 8.7 (L) 12.0 - 15.0 g/dL  ? HCT 27.7 (L) 36.0 - 46.0 %  ? MCV 92.0 80.0 - 100.0 fL  ? MCH 28.9 26.0 - 34.0 pg  ? MCHC 31.4 30.0 - 36.0 g/dL  ? RDW 14.3 11.5 - 15.5 %  ? Platelets 416 (H) 150 - 400 K/uL  ? nRBC 0.0 0.0 - 0.2 %  ?Comprehensive metabolic panel     Status: Abnormal  ? Collection Time: 06/01/21  5:56 AM  ?Result Value Ref Range  ? Sodium 138 135 - 145 mmol/L  ? Potassium 3.8 3.5 - 5.1 mmol/L  ? Chloride 98 98 - 111 mmol/L  ? CO2 32 22 - 32  mmol/L  ? Glucose, Bld 132 (H) 70 - 99 mg/dL  ? BUN 22 (H) 6 - 20 mg/dL  ? Creatinine, Ser 0.80 0.44 - 1.00 mg/dL  ? Calcium 8.5 (L) 8.9 - 10.3 mg/dL  ? Total Protein 6.9 6.5 - 8.1 g/dL  ? Albumin 2.0 (L) 3.5 - 5.0 g/dL  ? AST 21 15 - 41 U/L  ? ALT 42 0 - 44 U/L  ? Alkaline Phosphatase 236 (H) 38 - 126 U/L  ? Total Bilirubin 0.5 0.3 - 1.2 mg/dL  ? GFR, Estimated >60 >60 mL/min  ? Anion gap 8 5 - 15  ?Glucose, capillary     Status: Abnormal  ? Collection Time: 06/01/21  7:35 AM  ?Result Value Ref Range  ? Glucose-Capillary 126 (H) 70 - 99 mg/dL  ?Glucose, capillary     Status: Abnormal  ? Collection Time: 06/01/21 11:27 AM  ?Result Value Ref Range  ? Glucose-Capillary 142 (H) 70 - 99 mg/dL  ? ? ?Assessment & Plan: ?The plan of care was discussed with the bedside nurse for the day, Chrys Racer, who is in agreement with this plan and no additional concerns were raised.  ? ?Present on Admission: ? Neuroendocrine carcinoma ([place]) ? ? ? LOS: 27 days  ? ?Additional comments:I reviewed the patient's new clinical lab test  results.   and I reviewed the patients new imaging test results.   ? ?Neuroendocrine tumor on colonoscopy - s/p exlap, R hemicolectomy, cholecystectomy 3/9. Well differentiated neuroendocrine tumor with 1/7 +nodes. D/w Dr. Marin Olp 3/15 will likely need adjuvant chemo. Staples out 3/24. Bottom 2cm of wound packed. ?Respiratory failure 2/2 volume overload/?PNA - CT chest 3/11 and 3/13 with no PE. Intubated 3/16, suspect PNA based on CXR but negative resp cx and BAL, s/p 7d empiric merrem. Decompensation 3/26, likely will need trach when settings lower, d/w husband. Discussed this patient with pulmonology 3/29 AM, recommend continuing current care, no abx unless organism identified. Repeat pulm c/s 4/1, brovana, yupelri, singulair added. Trach 4/4. ?Volume overload - previously resolved, but 3/24 PM, went into flash pulmonary edema. D/c lasix gtt and trial IV prn dosing, goal neg 1L/24h. Echo 3/28, essentially normal.  ?Altered mental status - CT head 3/13, 3/16/ 3/17 normal (h/o VP shunt). Changed cefepime to zosyn 3/13, total 7d course-end today, empiric coverage for PNA. Resp cx today. Ucx 3/13 negative. Bcx 3/15 NGTD. LP 3/16 NGTD. CT A/P 3/16 negative. 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 Neurology on board, changed to keppra, cont EEG without sz, encephalopathy much improved  ?Diarrhea - d/c'd imodium to encourage NH4 loss, restarted 3/25, incr to TID, cont cholestyramine TID, added lomotil ?vWD - humate P per Heme, end 3/12, stable ?Transaminitis - likely 2/2 TPN, now d/c'd ?Leukocytosis, fever - 14 this AM, infectious workup unrevealing, curbside ID consult 3/28 with no add'l recs, LE duplex negative 3/31 ?FEN - NPO, OGT, tol TF at goal, PA 16, d/w nutrition with RD and incr TF to 65/h goal, not tolerating feeds well 2/2 abd pain, so cont at 1/2 rate, replete K ?ABLA - stable ?DVT - SCDs, s/p DDVAP, LMWH, LE duplex neg for DVT 3/13 ?Foley - replaced 3/18, d/c'd 3/24, replaced 3/25 for  accurate I/O on lasix gtt, d/c today ?Dispo - ICU, PT/OT/SLP, anticipate LTAC ? ?Critical Care Total Time: 45 minutes ? ?[name], MD ?Trauma & General Surgery ?Please use [email or web address] to contact on call provider ? ?06/01/2021 ? ?*Care during the described time interval was provided by me.  I have reviewed this patient's available data, including medical history, events of note, physical examination and test results as part of my evaluation. ? ? ? ?

## 2021-06-01 NOTE — Progress Notes (Signed)
Orthopedic Tech Progress Note ?Patient Details:  ?Cassidy Tran ?[birth year] ?[id number] ? ?Ortho Devices ?Type of Ortho Device: Prafo boot/shoe ?Ortho Device/Splint Location: RLE ?Ortho Device/Splint Interventions: Ordered, Application, Adjustment ?  ?Post Interventions ?Patient Tolerated: Well ?Instructions Provided: Care of device ? ?Janit Pagan ?06/01/2021, 1:24 PM ? ?

## 2021-06-02 DIAGNOSIS — C7A8 Other malignant neuroendocrine tumors: Secondary | ICD-10-CM | POA: Diagnosis not present

## 2021-06-02 DIAGNOSIS — Z930 Tracheostomy status: Secondary | ICD-10-CM | POA: Diagnosis not present

## 2021-06-02 DIAGNOSIS — J449 Chronic obstructive pulmonary disease, unspecified: Secondary | ICD-10-CM | POA: Diagnosis not present

## 2021-06-02 LAB — COMPREHENSIVE METABOLIC PANEL
ALT: 36 U/L (ref 0–44)
AST: 19 U/L (ref 15–41)
Albumin: 2.1 g/dL — ABNORMAL LOW (ref 3.5–5.0)
Alkaline Phosphatase: 237 U/L — ABNORMAL HIGH (ref 38–126)
Anion gap: 11 (ref 5–15)
BUN: 19 mg/dL (ref 6–20)
CO2: 30 mmol/L (ref 22–32)
Calcium: 8.4 mg/dL — ABNORMAL LOW (ref 8.9–10.3)
Chloride: 99 mmol/L (ref 98–111)
Creatinine, Ser: 0.66 mg/dL (ref 0.44–1.00)
GFR, Estimated: >60 mL/min (ref >60)
Glucose, Bld: 138 mg/dL — ABNORMAL HIGH (ref 70–99)
Potassium: 3.1 mmol/L — ABNORMAL LOW (ref 3.5–5.1)
Sodium: 140 mmol/L (ref 135–145)
Total Bilirubin: 0.5 mg/dL (ref 0.3–1.2)
Total Protein: 7 g/dL (ref 6.5–8.1)

## 2021-06-02 LAB — GLUCOSE, CAPILLARY
Glucose-Capillary: 114 mg/dL — ABNORMAL HIGH (ref 70–99)
Glucose-Capillary: 134 mg/dL — ABNORMAL HIGH (ref 70–99)
Glucose-Capillary: 117 mg/dL — ABNORMAL HIGH (ref 70–99)
Glucose-Capillary: 117 mg/dL — ABNORMAL HIGH (ref 70–99)
Glucose-Capillary: 168 mg/dL — ABNORMAL HIGH (ref 70–99)
Glucose-Capillary: 153 mg/dL — ABNORMAL HIGH (ref 70–99)

## 2021-06-02 LAB — CBC
HCT: 29.3 % — ABNORMAL LOW (ref 36.0–46.0)
Hemoglobin: 9.3 g/dL — ABNORMAL LOW (ref 12.0–15.0)
MCH: 29.2 pg (ref 26.0–34.0)
MCHC: 31.7 g/dL (ref 30.0–36.0)
MCV: 91.8 fL (ref 80.0–100.0)
Platelets: 463 10*3/uL — ABNORMAL HIGH (ref 150–400)
RBC: 3.19 MIL/uL — ABNORMAL LOW (ref 3.87–5.11)
RDW: 14.2 % (ref 11.5–15.5)
WBC: 17.2 10*3/uL — ABNORMAL HIGH (ref 4.0–10.5)
nRBC: 0 % (ref 0.0–0.2)

## 2021-06-02 LAB — ACID FAST SMEAR (AFB, MYCOBACTERIA): Acid Fast Smear: NEGATIVE

## 2021-06-02 MED ORDER — FUROSEMIDE 10 MG/ML IJ SOLN
40.0000 mg | Freq: Four times a day (QID) | INTRAMUSCULAR | Status: AC
  Administered 2021-06-02 (×2): 40 mg via INTRAVENOUS
  Filled 2021-06-02 (×2): qty 4

## 2021-06-02 MED ORDER — POTASSIUM CHLORIDE 20 MEQ PO PACK
40.0000 meq | PACK | ORAL | Status: AC
  Administered 2021-06-02 (×2): 40 meq
  Filled 2021-06-02 (×2): qty 2

## 2021-06-02 MED ORDER — POTASSIUM CHLORIDE 20 MEQ PO PACK
20.0000 meq | PACK | ORAL | Status: DC
  Administered 2021-06-02: 20 meq
  Filled 2021-06-02: qty 1

## 2021-06-02 MED ORDER — POTASSIUM CHLORIDE 10 MEQ/50ML IV SOLN
10.0000 meq | INTRAVENOUS | Status: DC
  Administered 2021-06-02 (×2): 10 meq via INTRAVENOUS
  Filled 2021-06-02 (×4): qty 50

## 2021-06-02 MED ORDER — QUETIAPINE FUMARATE 25 MG PO TABS
25.0000 mg | ORAL_TABLET | Freq: Two times a day (BID) | ORAL | Status: DC
  Administered 2021-06-02 (×2): 25 mg
  Filled 2021-06-02 (×2): qty 1

## 2021-06-02 NOTE — Progress Notes (Signed)
? ?Trauma/Critical Care Follow Up Note ? ?Subjective:  ?  ?Overnight Issues:  ? ?Objective:  ?Vital signs for last 24 hours: ?Temp:  [98.1 ?F (36.7 ?C)-100.8 ?F (38.2 ?C)] 100.8 ?F (38.2 ?C) (04/06 0400) ?Pulse Rate:  [68-101] 75 (04/06 0700) ?Resp:  [13-32] 21 (04/06 0700) ?BP: (87-119)/(34-67) 98/52 (04/06 0700) ?SpO2:  [86 %-100 %] 91 % (04/06 0700) ?FiO2 (%):  [40 %] 40 % (04/06 0600) ?Weight:  [86 kg] 86 kg (04/06 0444) ? ?Hemodynamic parameters for last 24 hours: ?  ? ?Intake/Output from previous day: ?04/05 0701 - 04/06 0700 ?In: 1301 [I.V.:463.9; NG/GT:670; IV Piggyback:17.1] ?Out: 1125 [Urine:1125]  ?Intake/Output this shift: ?No intake/output data recorded. ? ?Vent settings for last 24 hours: ?FiO2 (%):  [40 %] 40 % ? ?Physical Exam:  ?Gen: comfortable, no distress ?Neuro: non-focal exam, interactive ?HEENT: PERRL ?Neck: supple, trached ?CV: RRR ?Pulm: unlabored breathing ?Abd: soft, NT, PEG ?GU: clear yellow urine ?Extr: wwp, 1+ edema ? ? ?Results for orders placed or performed during the hospital encounter of 05/05/21 (from the past 24 hour(s))  ?Glucose, capillary     Status: Abnormal  ? Collection Time: 06/01/21 11:27 AM  ?Result Value Ref Range  ? Glucose-Capillary 142 (H) 70 - 99 mg/dL  ?Glucose, capillary     Status: Abnormal  ? Collection Time: 06/01/21  3:58 PM  ?Result Value Ref Range  ? Glucose-Capillary 134 (H) 70 - 99 mg/dL  ?Glucose, capillary     Status: Abnormal  ? Collection Time: 06/01/21  7:34 PM  ?Result Value Ref Range  ? Glucose-Capillary 126 (H) 70 - 99 mg/dL  ?Glucose, capillary     Status: Abnormal  ? Collection Time: 06/01/21 11:13 PM  ?Result Value Ref Range  ? Glucose-Capillary 130 (H) 70 - 99 mg/dL  ?Glucose, capillary     Status: Abnormal  ? Collection Time: 06/02/21  3:26 AM  ?Result Value Ref Range  ? Glucose-Capillary 114 (H) 70 - 99 mg/dL  ?CBC     Status: Abnormal  ? Collection Time: 06/02/21  4:30 AM  ?Result Value Ref Range  ? WBC 17.2 (H) 4.0 - 10.5 K/uL  ? RBC 3.19  (L) 3.87 - 5.11 MIL/uL  ? Hemoglobin 9.3 (L) 12.0 - 15.0 g/dL  ? HCT 29.3 (L) 36.0 - 46.0 %  ? MCV 91.8 80.0 - 100.0 fL  ? MCH 29.2 26.0 - 34.0 pg  ? MCHC 31.7 30.0 - 36.0 g/dL  ? RDW 14.2 11.5 - 15.5 %  ? Platelets 463 (H) 150 - 400 K/uL  ? nRBC 0.0 0.0 - 0.2 %  ?Comprehensive metabolic panel     Status: Abnormal  ? Collection Time: 06/02/21  4:30 AM  ?Result Value Ref Range  ? Sodium 140 135 - 145 mmol/L  ? Potassium 3.1 (L) 3.5 - 5.1 mmol/L  ? Chloride 99 98 - 111 mmol/L  ? CO2 30 22 - 32 mmol/L  ? Glucose, Bld 138 (H) 70 - 99 mg/dL  ? BUN 19 6 - 20 mg/dL  ? Creatinine, Ser 0.66 0.44 - 1.00 mg/dL  ? Calcium 8.4 (L) 8.9 - 10.3 mg/dL  ? Total Protein 7.0 6.5 - 8.1 g/dL  ? Albumin 2.1 (L) 3.5 - 5.0 g/dL  ? AST 19 15 - 41 U/L  ? ALT 36 0 - 44 U/L  ? Alkaline Phosphatase 237 (H) 38 - 126 U/L  ? Total Bilirubin 0.5 0.3 - 1.2 mg/dL  ? GFR, Estimated >60 >60 mL/min  ? Anion  gap 11 5 - 15  ? ? ?Assessment & Plan: ?The plan of care was discussed with the bedside nurse for the day, who is in agreement with this plan and no additional concerns were raised.  ? ?Present on Admission: ? Neuroendocrine carcinoma ([place]) ? ? ? LOS: 28 days  ? ?Additional comments:I reviewed the patient's new clinical lab test results.   and I reviewed the patients new imaging test results.   ? ?Neuroendocrine tumor on colonoscopy - s/p exlap, R hemicolectomy, cholecystectomy 3/9. Well differentiated neuroendocrine tumor with 1/7 +nodes. D/w Dr. Marin Olp 3/15 will likely need adjuvant chemo. Staples out 3/24. Bottom 2cm of wound packed. ?Respiratory failure 2/2 volume overload/?PNA - CT chest 3/11 and 3/13 with no PE. Intubated 3/16, suspect PNA based on CXR but negative resp cx and BAL, s/p 7d empiric merrem. Decompensation 3/26, likely will need trach when settings lower, d/w husband. Discussed this patient with pulmonology 3/29 AM, recommend continuing current care, no abx unless organism identified. Repeat pulm c/s 4/1, brovana, yupelri,  singulair added. Trach 4/4. ?Volume overload - previously resolved, but 3/24 PM, went into flash pulmonary edema. D/c lasix gtt and trial IV prn dosing, goal neg 1L/24h. Echo 3/28, essentially normal.  ?Altered mental status - CT head 3/13, 3/16/ 3/17 normal (h/o VP shunt). Changed cefepime to zosyn 3/13, total 7d course-end today, empiric coverage for PNA. Resp cx today. Ucx 3/13 negative. Bcx 3/15 NGTD. LP 3/16 NGTD. CT A/P 3/16 negative. 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 Neurology on board, changed to keppra, cont EEG without sz, encephalopathy much improved  ?Anxiety - on valium 10q6, still req dex, add seroquel today ?Diarrhea - d/c'd imodium to encourage NH4 loss, restarted 3/25, incr to TID, cont cholestyramine TID, added lomotil ?vWD - humate P per Heme, end 3/12, stable ?Transaminitis - likely 2/2 TPN, now d/c'd ?Leukocytosis, fever - 17 this AM, infectious workup unrevealing, curbside ID consult 3/28 with no add'l recs, LE duplex negative 3/31 ?FEN - NPO, OGT, tol TF at goal, PA 16, d/w nutrition with RD and incr TF to 65/h goal, abd pain improved, so incr to goal rate, replete K ?ABLA - stable ?DVT - SCDs, s/p DDVAP, LMWH, LE duplex neg for DVT 3/13 ?Foley - replaced 3/18, d/c'd 3/24, replaced 3/25 for accurate I/O on lasix gtt, d/c 4/5, spont voids ?Dispo - ICU, PT/OT/SLP, anticipate LTAC/CIR, wean dex ? ?Critical Care Total Time: 35 minutes ? ?[name], MD ?Trauma & General Surgery ?Please use [email or web address] to contact on call provider ? ?06/02/2021 ? ?*Care during the described time interval was provided by me. I have reviewed this patient's available data, including medical history, events of note, physical examination and test results as part of my evaluation. ? ? ? ?

## 2021-06-02 NOTE — Evaluation (Signed)
Occupational Therapy Evaluation ?Patient Details ?Name: Cassidy Tran ?MRN: [id number] ?DOB: Jun 11, 1975 ?Today's Date: 06/02/2021 ? ? ?History of Present Illness 46 y.o. female presents to [facility] hospital on 05/05/2021 and underwent ex-lap with cholecystectomy and R hemicolectomy for neuroendocrine tumor. Pt developed respiratory failure due to volume overload and required intubation on 3/16. Pt developed AMS during admission with rising white cell counts, underwent lumbar punture on 3/16. Pt underwent bronchoscopy on 3/21. Bronchoscopy 4/1, tracheostomy 4/3. PMH includes ASCUS, clotting disorder, seizures, PNA, Von Willebrand disease, VP shunt placement.  ? ?Clinical Impression ?  ?Patient able to advance to sitting at edge of bed.  Patient is initially very flat, and not putting forth strong effort, but able to sit at the edge of the bed for a few minutes before increased complaints of dizziness.  Patient really not needing much support to sit at the edge, and gives the appearance that, with +2, could stand and pivot to the recliner.  OT to continue to follow, and advance ADL status in the acute setting.  AIR is recommended for post acute rehab to regain her prior level of function.  HR to 123 with sitting, O2 remained at 100%, and RR to 24.   ?   ? ?Recommendations for follow up therapy are one component of a multi-disciplinary discharge planning process, led by the attending physician.  Recommendations may be updated based on patient status, additional functional criteria and insurance authorization.  ? ?Follow Up Recommendations ? Acute inpatient rehab (3hours/day)  ?  ?Assistance Recommended at Discharge Frequent or constant Supervision/Assistance  ?Patient can return home with the following Two people to help with walking and/or transfers;A lot of help with bathing/dressing/bathroom;Assistance with cooking/housework;Assistance with feeding;Direct supervision/assist for medications management;Help with stairs or ramp  for entrance;Assist for transportation;Direct supervision/assist for financial management ? ?  ?Functional Status Assessment ? Patient has had a recent decline in their functional status and demonstrates the ability to make significant improvements in function in a reasonable and predictable amount of time.  ?Equipment Recommendations ? Wheelchair cushion (measurements OT);Wheelchair (measurements OT);BSC/3in1;Tub/shower seat  ?  ?Recommendations for Other Services Rehab consult ? ? ?  ?Precautions / Restrictions Precautions ?Precautions: Other (comment) ?Precaution Comments: trach collar ?Restrictions ?Weight Bearing Restrictions: No  ? ?  ? ?Mobility Bed Mobility ?Overal bed mobility: Needs Assistance ?Bed Mobility: Sidelying to Sit, Sit to Supine ?  ?Sidelying to sit: Mod assist, HOB elevated ?  ?Sit to supine: Total assist ?  ?General bed mobility comments: increased time and encouragement.  Assist to bring legs off and advance hips. ?Patient Response: Flat affect ? ?Transfers ?  ?  ?  ?  ?  ?  ?  ?  ?  ?  ?  ? ?  ?Balance Overall balance assessment: Needs assistance ?Sitting-balance support: Feet supported ?Sitting balance-Leahy Scale: Fair ?Sitting balance - Comments: Increased dizziness, but BP stable. ?  ?  ?  ?  ?  ?  ?  ?  ?  ?  ?  ?  ?  ?  ?  ?   ? ?ADL either performed or assessed with clinical judgement  ? ?ADL   ?  ?  ?Grooming: Wash/dry hands;Wash/dry face;Moderate assistance;Bed level ?  ?Upper Body Bathing: Moderate assistance;Bed level ?  ?Lower Body Bathing: Total assistance;Bed level ?  ?Upper Body Dressing : Moderate assistance;Bed level ?  ?Lower Body Dressing: Total assistance;Bed level ?  ?  ?  ?Toileting- Clothing Manipulation and Hygiene: Total assistance;Bed level ?  ?  ?  ?  ?   ? ? ? ?  Vision Patient Visual Report: No change from baseline ?   ?   ?Perception Perception ?Perception: Within Functional Limits ?  ?Praxis Praxis ?Praxis: Intact ?  ? ?Pertinent Vitals/Pain Pain Assessment ?Pain  Assessment: Faces ?Faces Pain Scale: Hurts a little bit ?Pain Location: abdomen ?Pain Descriptors / Indicators: Grimacing ?Pain Intervention(s): Monitored during session  ? ? ? ?Hand Dominance Right ?  ?Extremity/Trunk Assessment Upper Extremity Assessment ?Upper Extremity Assessment: Generalized weakness;RUE deficits/detail;LUE deficits/detail ?RUE Deficits / Details: limited shoulder flexion, difficulty bringing hands to mouth, weak grip. ? effort ?RUE Sensation: WNL ?RUE Coordination: WNL ?LUE Deficits / Details: limited shoulder flexion, difficulty bringing hands to mouth, weak grip. ? effort ?LUE Sensation: WNL ?LUE Coordination: WNL ?  ?Lower Extremity Assessment ?Lower Extremity Assessment: Defer to PT evaluation ?  ?Cervical / Trunk Assessment ?Cervical / Trunk Assessment: Kyphotic ?  ?Communication Communication ?Communication: Tracheostomy ?  ?Cognition Arousal/Alertness: Awake/alert ?Behavior During Therapy: Flat affect ?Overall Cognitive Status: Difficult to assess ?  ?  ?  ?  ?  ?  ?  ?  ?  ?  ?  ?  ?  ?  ?  ?  ?General Comments: answering appripriately with thumbs up/down ?  ?  ?   ? ?  ?   ?  ?    ? ? ?Home Living Family/patient expects to be discharged to:: Private residence ?Living Arrangements: Spouse/significant other ?Available Help at Discharge: Family;Available PRN/intermittently ?Type of Home: House ?  ?  ?  ?  ?  ?  ?  ?  ?  ?  ?  ?Home Equipment: None ?  ?  ?  ? ?  ?Prior Functioning/Environment Prior Level of Function : Independent/Modified Independent;Working/employed;Driving ?  ?  ?  ?  ?  ?  ?  ?  ?  ? ?  ?  ?OT Problem List: Decreased strength;Decreased range of motion;Decreased activity tolerance;Impaired balance (sitting and/or standing);Decreased safety awareness;Pain;Increased edema ?  ?   ?OT Treatment/Interventions: Self-care/ADL training;Therapeutic exercise;DME and/or AE instruction;Therapeutic activities;Balance training;Patient/family education  ?  ?OT Goals(Current goals can  be found in the care plan section) Acute Rehab OT Goals ?Patient Stated Goal: Thumbs up to get better ?OT Goal Formulation: With patient ?Time For Goal Achievement: 06/16/21 ?Potential to Achieve Goals: Good ?ADL Goals ?Pt Will Perform Grooming: with supervision;sitting ?Pt Will Perform Upper Body Dressing: with supervision;sitting ?Pt Will Perform Lower Body Dressing: with min assist;sitting/lateral leans ?Pt Will Transfer to Toilet: with min assist;stand pivot transfer;bedside commode ?Pt/caregiver will Perform Home Exercise Program: Increased strength;Both right and left upper extremity;With Supervision  ?OT Frequency: Min 2X/week ?  ? ?Co-evaluation   ?  ?  ?  ?  ? ?  ?AM-PAC OT "6 Clicks" Daily Activity     ?Outcome Measure Help from another person eating meals?: Total ?Help from another person taking care of personal grooming?: A Lot ?Help from another person toileting, which includes using toliet, bedpan, or urinal?: A Lot ?Help from another person bathing (including washing, rinsing, drying)?: A Lot ?Help from another person to put on and taking off regular upper body clothing?: A Lot ?Help from another person to put on and taking off regular lower body clothing?: A Lot ?6 Click Score: 11 ?  ?End of Session Equipment Utilized During Treatment: Oxygen ?Nurse Communication: Mobility status ? ?Activity Tolerance: Other (comment) (limited by dizziness) ?Patient left: in bed;with call bell/phone within reach;with family/visitor present ? ?OT Visit Diagnosis: Unsteadiness on feet (R26.81);Muscle weakness (generalized) (M62.81);Dizziness and  giddiness (R42);Pain  ?              ?Time: 9528-4132 ?OT Time Calculation (min): 25 min ?Charges:  OT General Charges ?$OT Visit: 1 Visit ?OT Evaluation ?$OT Eval Moderate Complexity: 1 Mod ?OT Treatments ?$Therapeutic Activity: 8-22 mins ? ?06/02/2021 ? ?RP, OTR/L ? ?Acute Rehabilitation Services ? ?Office:  [phone] ? ? ?Candas Deemer D Corlette Ciano ?06/02/2021, 5:11 PM ?

## 2021-06-02 NOTE — TOC Initial Note (Signed)
Transition of Care (TOC) - Initial/Assessment Note  ? ? ?Patient Details  ?Name: Cassidy Tran ?MRN: [id number] ?Date of Birth: 08-01-1975 ? ?Transition of Care [facility]) CM/SW Contact:    ?Ella Bodo, RN ?Phone Number: ?06/02/2021, 4:36 PM ? ?Clinical Narrative:                 ?46 y.o. female presents to [facility] hospital on 05/05/2021 and underwent ex-lap with cholecystectomy and R hemicolectomy for neuroendocrine tumor. Pt developed respiratory failure due to volume overload and required intubation on 3/16. Pt developed AMS during admission with rising white cell counts, underwent lumbar punture on 3/16. Pt underwent bronchoscopy on 3/21. Bronchoscopy 4/1, tracheostomy 4/3.  ?PTA, pt independent and working; lives at home with spouse.   ?CM consult for LTAC:   ?Spoke with patient's spouse, [name], regarding [facility], and provided choice of facilities.  Husband prefers [facility], as they live very close to the hospital.  Husband agreeable to referral to [facility] and initiation of insurance auth, if pt meets admission criteria.  His hope would be for patient to eventually go to CIR at [facility] once she is stronger.  Referral to Marissa Calamity in admissions at [facility]; will provide updates as available.  ? ?Expected Discharge Plan: [place] (LTAC) ?Barriers to Discharge: Continued Medical Work up ? ? ?Patient Goals and CMS Choice ?  ?CMS Medicare.gov Compare Post Acute Care list provided to:: Patient Represenative (must comment) (husband) ?Choice offered to / list presented to : Patient ? ?Expected Discharge Plan and Services ?Expected Discharge Plan: [place] (LTAC) ?  ?Discharge Planning Services: CM Consult ?Post Acute Care Choice: Long Term Acute Care (LTAC) ?Living arrangements for the past 2 months: [place] ?                ?  ?  ?  ?  ?  ?  ?  ?  ?  ?  ? ?Prior Living Arrangements/Services ?Living arrangements for the past 2 months: [place] ?Lives with:: Spouse ?Patient language and need for interpreter reviewed:: Yes ?       ?Need for Family Participation in Patient Care: Yes (Comment) ?Care giver support system in place?: Yes (comment) ?  ?Criminal Activity/Legal Involvement Pertinent to Current Situation/Hospitalization: No - Comment as needed ? ?Activities of Daily Living ?Home Assistive Devices/Equipment: None ?ADL Screening (condition at time of admission) ?Patient's cognitive ability adequate to safely complete daily activities?: Yes ?Is the patient deaf or have difficulty hearing?: No ?Does the patient have difficulty seeing, even when wearing glasses/contacts?: No ?Does the patient have difficulty concentrating, remembering, or making decisions?: No ?Patient able to express need for assistance with ADLs?: Yes ?Does the patient have difficulty dressing or bathing?: No ?Independently performs ADLs?: Yes (appropriate for developmental age) ?Does the patient have difficulty walking or climbing stairs?: No ?Weakness of Legs: None ?Weakness of Arms/Hands: None ? ?  ?  ?   ?   ?   ?   ? ?Emotional Assessment ?Appearance:: Appears stated age ?Attitude/Demeanor/Rapport: Intubated (Following Commands or Not Following Commands) ?Affect (typically observed): Unable to Assess ?Orientation: : Oriented to Self (nods, gestures) ?  ?  ? ?Admission diagnosis:  Neuroendocrine carcinoma ([place]) [C7A.8] ?Patient Active Problem List  ? Diagnosis Date Noted  ? Pressure injury of skin 06/01/2021  ? Neuroendocrine carcinoma ([place]) 05/05/2021  ? Respiratory tract infection 03/26/2018  ? Acute stress reaction 03/26/2018  ?  Panic attack 03/26/2018  ? Severe persistent asthma 03/05/2017  ? Acute non-recurrent frontal sinusitis 03/05/2017  ? Allergic rhinitis 03/05/2017  ? Hyperlipidemia 03/05/2017  ? Vaccine refused by patient 03/05/2017  ? Communicating hydrocephalus ([place]) 11/24/2016  ? Pressure in head 11/21/2016  ? Chiari malformation type I ([place]) 11/06/2016   ? Chiari I malformation ([place]) 11/06/2016  ? Von Willebrand disease type IA ([place])   ? Neck pain 08/08/2016  ? Acute pain of right shoulder 08/08/2016  ? Upper back pain on right side 08/08/2016  ? Muscle spasm 08/08/2016  ? Spinal stenosis in cervical region 08/08/2016  ? Von Willebrand disease ([place]) 10/28/2010  ? Asthmatic bronchitis 10/28/2010  ? Seizure disorder ([place]) 10/28/2010  ? Anxiety 10/28/2010  ? Smoking 10/28/2010  ? Arthritis 10/28/2010  ? Headache(784.0) 10/28/2010  ? Blood dyscrasia 10/28/2010  ? ?PCP:  Janie Morning, DO ?Pharmacy:   ?[place] ?[place] [address] ?Phone: [phone] Fax: [phone] ? ?[id number] - [place], [address] ?[place] ?[place] [address] ?Phone: [phone] Fax: [phone] ? ? ? ? ?Social Determinants of Health (SDOH) Interventions ?  ? ?Readmission Risk Interventions ?   ? View : No data to display.  ?  ?  ?  ? ?Reinaldo Raddle, RN, BSN  ?Trauma/Neuro ICU Case Manager ?[phone] ? ? ? ?

## 2021-06-02 NOTE — Progress Notes (Signed)
? ?NAME:  Cassidy Tran, MRN:  [id number], DOB:  01/12/1976, LOS: 28 ?ADMISSION DATE:  05/05/2021, CONSULTATION DATE:  05/28/21 ?REFERRING MD:  Bobbye Morton, CHIEF COMPLAINT:  acute hypoxic respiratory failure ? ?History of Present Illness:  ?46 yo female smoker with neuroendocrine tumor of the terminal ileum presented for planned cholecystectomy with Rt hemicolectomy which was performed on 05/05/21.  Post op course complicated by HCAP with hypoxia requiring intubation and AMS from elevated ammonia 2nd to seizure medications. ? ?Pertinent  Medical History  ?Asthma, Epilepsy, Chiari malformation s/p VPS, Epilepsy, Von Willebrand disease, Biliary dyskinesia, Neuroendocrine tumor ? ?Significant Hospital Events: ?Including procedures, antibiotic start and stop dates in addition to other pertinent events   ?3/09 ex lap, R hemicolectomy, chole ?3/10 oncology consulted ?3/11 respiratory distress, transfer to ICU, start cefepime ?3/13 concern for AMS due to hyperammonemia ?3/15 neurosurgery consult to determine if issue with VP shunt contributing to AMS ?3/16 intubation, neurology consulted, LP performed ?3/21 bronchoscopy ?3/26 ETT exchange ?4/01 bronchoscopy ?4/03 tracheostomy ?4/04 off vent ? ?Studies:  ?CT angio chest 3/13 >> extensive GGO and interlobular septal thickening ?CT chest 3/27 >> stable multiple airspace opacities ?Echo 3/28 >> EF 60 to 65%, mod LVH ?Doppler legs b/l 3/31 >> no DVT ? ?Interim History / Subjective:  ?Remains on precedex.  Intermittent oxygen desaturation with coughing spells. ? ?Objective   ?Blood pressure (!) 110/58, pulse 79, temperature (!) 100.8 ?F (38.2 ?C), temperature source Axillary, resp. rate 16, height '5\' 3"'$  (1.6 m), weight 86 kg, last menstrual period 09/16/2012, SpO2 95 %. ?   ?FiO2 (%):  [40 %] 40 %  ? ?Intake/Output Summary (Last 24 hours) at 06/02/2021 0935 ?Last data filed at 06/02/2021 9767 ?Gross per 24 hour  ?Intake 1217.18 ml  ?Output 975 ml  ?Net 242.18 ml  ? ?Filed Weights  ?  05/31/21 0500 05/31/21 1600 06/02/21 0444  ?Weight: 89.9 kg 84.4 kg 86 kg  ? ? ?Examination: ? ?General - alert ?Eyes - pupils reactive ?ENT - trach site clean ?Cardiac - regular rate/rhythm, no murmur ?Chest - scattered rhonchi ?Abdomen - soft, non tender, + bowel sounds ?Extremities - no cyanosis, clubbing, or edema ?Skin - no rashes ?Neuro - RASS 0 ? ?Resolved Hospital Problem list   ? ? ?Assessment & Plan:  ? ?Acute hypoxic respiratory failure 2nd to HCAP. ?Hx of asthma, tobacco abuse. ?Failure to wean from ventilator s/p tracheostomy on 4/03. ?- completed ABx 3/27 ?- trach collar as tolerated with prn vent support  ?- goal SpO2 90 to 95% ?- f/u CXR intermittently ?- continue yupelri, brovana, pulmicort, singulair ?- prn albuterol ?- f/u with speech for PM valve and swallow assessment ? ?Metabolic alkalosis with hypokalemia. ?- likely from over diuresis ?- primary team managing diuretics and electrolytes ? ?S/p laparotomy with Rt hemicolectomy and cholecystectomy on 3/09. ?- post op care, nutrition per surgery ? ?Acute metabolic encephalopathy 2nd to hypoxia, elevated ammonia. ?Hx of Chiari malformation with VP shunt. ?Epilepsy. ?- seen by neurosurgery and by neurology ?- hopefully can wean of precedex soon >> defer plan to primary team ? ?Neuroendocrine tumor of terminal ileum. ?Von Willebrand Disease. ?- seen by Dr. Marin Olp with Hematology/Oncology ? ?Anemia of critical illness. ?- f/u CBC ?- transfuse for Hb < 7 ? ?Pressure injury. ?- Lt heal, stage 1, no present on admission ?- wound care ? ?Deconditioning. ?- PT consulted ? ?At pt's husband's request, PCCM will continue to follow for asthma/COPD management.  She will need outpt pulmonary follow up. ? ?  Updated pt's husband at bedside. ? ?Best Practice (right click and "Reselect all SmartList Selections" daily)  ? ?DVT: lovenox ?SUP: protonix ?Nutrition: tube feeds ?Lines: Rt PICC 3/27 ?Code: full code ? ?Labs:  ? ? ?  Latest Ref Rng & Units 06/02/2021  ?  4:30  AM 06/01/2021  ?  5:56 AM 05/31/2021  ?  4:09 PM  ?CMP  ?Glucose 70 - 99 mg/dL 138   132   125    ?BUN 6 - 20 mg/dL '19   22   22    '$ ?Creatinine 0.44 - 1.00 mg/dL 0.66   0.80   0.68    ?Sodium 135 - 145 mmol/L 140   138   142    ?Potassium 3.5 - 5.1 mmol/L 3.1   3.8   3.2    ?Chloride 98 - 111 mmol/L 99   98   95    ?CO2 22 - 32 mmol/L 30   32   36    ?Calcium 8.9 - 10.3 mg/dL 8.4   8.5   7.9    ?Total Protein 6.5 - 8.1 g/dL 7.0   6.9     ?Total Bilirubin 0.3 - 1.2 mg/dL 0.5   0.5     ?Alkaline Phos 38 - 126 U/L 237   236     ?AST 15 - 41 U/L 19   21     ?ALT 0 - 44 U/L 36   42     ? ? ? ?  Latest Ref Rng & Units 06/02/2021  ?  4:30 AM 06/01/2021  ?  5:56 AM 05/31/2021  ?  5:38 AM  ?CBC  ?WBC 4.0 - 10.5 K/uL 17.2   15.5   14.4    ?Hemoglobin 12.0 - 15.0 g/dL 9.3   8.7   8.5    ?Hematocrit 36.0 - 46.0 % 29.3   27.7   26.5    ?Platelets 150 - 400 K/uL 463   416   394    ? ? ?ABG ?   ?Component Value Date/Time  ? PHART 7.476 (H) 05/31/2021 0453  ? PCO2ART 55.7 (H) 05/31/2021 0453  ? PO2ART 102 05/31/2021 0453  ? HCO3 41.2 (H) 05/31/2021 0453  ? TCO2 43 (H) 05/31/2021 0453  ? O2SAT 98 05/31/2021 0453  ? ? ?CBG (last 3)  ?Recent Labs  ?  06/01/21 ?2313 06/02/21 ?0326 06/02/21 ?0845  ?GLUCAP 130* 114* 134*  ? ? ?Signature:  ?Chesley Mires, MD ?[place] ?Pager - [phone] ?06/02/2021, 9:35 AM ? ? ? ?  ?

## 2021-06-02 NOTE — Progress Notes (Signed)
Speech Language Pathology Treatment: [name] Speaking valve  ?Patient Details ?Name: Cassidy Tran ?MRN: [id number] ?DOB: Apr 21, 1975 ?Today's Date: 06/02/2021 ?Time: 1230-1250 ?SLP Time Calculation (min) (ACUTE ONLY): 20 min ? ?Assessment / Plan / Recommendation ?Clinical Impression ? Cassidy Tran was seen at bedside for further PMSV trials after tolerating cuff deflation for a few hours. Patient's sats upon SLP entry in the room were stable, although HR was slightly elevated at 123. Pt's father at bedside throughout the duration of the session. SLP awakened pt and placed PMSV on. SLP provided speaking opportunities and encouragement across the session in order to attain functional phonation. Pt was able to phonate /h/ as an approximation for "hi" indicating that she was able to redirect subglottic airflow to Cassidy structures. SLP also facilitated visual reinforcement for the pt to blow a napkin, and pt completed this task with success. Pt's vitals remained stable; RR at 22, SpO2 at 100, and slightly elevated HR at 130. Pt declined any further speaking opportunities at the conclusion of the session. Cuff should remain deflated as long as patient's vitals remain stable. Today's session is encouraging for future PMSV trials for the pt to achieve phonation pertinent to functional communication. PMSV may only be worn with ST at this time. SLP to follow for continued PMSV trials.  ?  ?HPI HPI: 46 yo female smoker with neuroendocrine tumor of the terminal ileum presented for planned cholecystectomy with Rt hemicolectomy which was performed on 05/05/21.  Post op course complicated by HCAP with hypoxia requiring intubation and AMS from elevated ammonia 2nd to seizure medications. Trach and PEG placed 3/29.  Pt with prior medical history of Asthma, Epilepsy, Chiari malformation s/p VPS, Epilepsy, Von Willebrand disease, Biliary dyskinesia, Neuroendocrine tumor. ?  ?   ?SLP Plan ? Continue with current plan of care ? ?  ?   ?Recommendations for follow up therapy are one component of a multi-disciplinary discharge planning process, led by the attending physician.  Recommendations may be updated based on patient status, additional functional criteria and insurance authorization. ?  ? ?Recommendations  ?   ?   ? Patient may use Passy-Muir Speech Valve: with SLP only ?PMSV Supervision: Full ?MD: Please consider changing trach tube to : Cuffless  ?   ? ? ? ? Cassidy Care Recommendations: Cassidy care BID ?Follow Up Recommendations: Acute inpatient rehab (3hours/day) ?Assistance recommended at discharge: Frequent or constant Supervision/Assistance ?SLP Visit Diagnosis: Aphonia (R49.1) ?Plan: Continue with current plan of care ? ? ? ? ?  ?  ? ? ?Vaughan Sine ? ?06/02/2021, 1:14 PM ?

## 2021-06-02 NOTE — Progress Notes (Signed)
[facility] ADULT ICU REPLACEMENT PROTOCOL ? ? ?The patient does apply for the [facility] Adult ICU Electrolyte Replacment Protocol based on the criteria listed below:  ? ?1.Exclusion criteria: TCTS patients, ECMO patients, and Dialysis patients ?2. Is GFR >/= 30 ml/min? Yes.    ?Patient's GFR today is >60 ?3. Is SCr </= 2? Yes.   ?Patient's SCr is 0.66 mg/dL ?4. Did SCr increase >/= 0.5 in 24 hours? No. ?5.Pt's weight >40kg  Yes.   ?6. Abnormal electrolyte(s): K+ 3.1  ?7. Electrolytes replaced per protocol ?8.  Call MD STAT for K+ </= 2.5, Phos </= 1, or Mag </= 1 ?Physician:  n/a ? ?Cassidy Tran 06/02/2021 6:04 AM ? ?

## 2021-06-02 NOTE — Progress Notes (Signed)
Speech Language Pathology Treatment: Cassidy Tran Speaking valve  ?Patient Details ?Name: Cassidy Tran ?MRN: [id number] ?DOB: 09-14-1975 ?Today's Date: 06/02/2021 ?Time: 9678-9381 ?SLP Time Calculation (min) (ACUTE ONLY): 15 min ? ?Assessment / Plan / Recommendation ?Clinical Impression ? Cassidy Tran was seen at bedside for PMSV trials. Her husband was present throughout the duration of today's session. RN present during cuff deflation. SLP deflated patient's cuff and she immediately had strong cough reaction with observed tracheal secretions. SLP donned PMSV with SpO2 dropping to 81. SLP removed PMSV and instructed the pt to take deep breaths as RR also escalated to 40-50 range. Pt slowly recovered to low 90s range for SpO2 and 22 for RR. SLP provided positive feedback pt that she is tolerating cuff deflation, which is an improvement since the last targeted session (06/01/21). SLP will try to return later this date for further PMSV trials as pt becomes more stable with cuff deflation. SLP recommends that the patient's cuff remain deflated as long as vitals remain stable. SLP provided education to the patient and her husband about tracheostomy and PMSV use. PMSV should only be used with ST under full supervision at this time. SLP will continue to follow for further PMSV trials. ?  ?HPI HPI: 46 yo female smoker with neuroendocrine tumor of the terminal ileum presented for planned cholecystectomy with Rt hemicolectomy which was performed on 05/05/21.  Post op course complicated by HCAP with hypoxia requiring intubation and AMS from elevated ammonia 2nd to seizure medications. Trach and PEG placed 3/29.  Pt with prior medical history of Asthma, Epilepsy, Chiari malformation s/p VPS, Epilepsy, Von Willebrand disease, Biliary dyskinesia, Neuroendocrine tumor. ?  ?   ?SLP Plan ? Continue with current plan of care ? ?  ?  ?Recommendations for follow up therapy are one component of a multi-disciplinary discharge planning process,  led by the attending physician.  Recommendations may be updated based on patient status, additional functional criteria and insurance authorization. ?  ? ?Recommendations  ?   ?   ? Patient may use Passy-Muir Speech Valve: with SLP only ?PMSV Supervision: Full ?MD: Please consider changing trach tube to : Cuffless  ?   ? ? ? ? Oral Care Recommendations: Oral care BID ?Follow Up Recommendations: Acute inpatient rehab (3hours/day) ?Assistance recommended at discharge: Frequent or constant Supervision/Assistance ?SLP Visit Diagnosis: Aphonia (R49.1) ?Plan: Continue with current plan of care ? ? ? ? ?  ?  ? ? ?Vaughan Sine ? ?06/02/2021, 10:19 AM ?

## 2021-06-03 ENCOUNTER — Inpatient Hospital Stay (HOSPITAL_COMMUNITY): Payer: No Typology Code available for payment source

## 2021-06-03 DIAGNOSIS — C7A8 Other malignant neuroendocrine tumors: Secondary | ICD-10-CM | POA: Diagnosis not present

## 2021-06-03 DIAGNOSIS — J4550 Severe persistent asthma, uncomplicated: Secondary | ICD-10-CM

## 2021-06-03 DIAGNOSIS — Z430 Encounter for attention to tracheostomy: Secondary | ICD-10-CM

## 2021-06-03 LAB — CBC
HCT: 29.2 % — ABNORMAL LOW (ref 36.0–46.0)
Hemoglobin: 9.1 g/dL — ABNORMAL LOW (ref 12.0–15.0)
MCH: 28.5 pg (ref 26.0–34.0)
MCHC: 31.2 g/dL (ref 30.0–36.0)
MCV: 91.5 fL (ref 80.0–100.0)
Platelets: 511 10*3/uL — ABNORMAL HIGH (ref 150–400)
RBC: 3.19 MIL/uL — ABNORMAL LOW (ref 3.87–5.11)
RDW: 14.6 % (ref 11.5–15.5)
WBC: 23.8 10*3/uL — ABNORMAL HIGH (ref 4.0–10.5)
nRBC: 0 % (ref 0.0–0.2)

## 2021-06-03 LAB — BASIC METABOLIC PANEL
Anion gap: 7 (ref 5–15)
BUN: 14 mg/dL (ref 6–20)
CO2: 29 mmol/L (ref 22–32)
Calcium: 8.4 mg/dL — ABNORMAL LOW (ref 8.9–10.3)
Chloride: 105 mmol/L (ref 98–111)
Creatinine, Ser: 0.57 mg/dL (ref 0.44–1.00)
GFR, Estimated: >60 mL/min (ref >60)
Glucose, Bld: 151 mg/dL — ABNORMAL HIGH (ref 70–99)
Potassium: 3.7 mmol/L (ref 3.5–5.1)
Sodium: 141 mmol/L (ref 135–145)

## 2021-06-03 LAB — GLUCOSE, CAPILLARY
Glucose-Capillary: 123 mg/dL — ABNORMAL HIGH (ref 70–99)
Glucose-Capillary: 131 mg/dL — ABNORMAL HIGH (ref 70–99)
Glucose-Capillary: 138 mg/dL — ABNORMAL HIGH (ref 70–99)
Glucose-Capillary: 153 mg/dL — ABNORMAL HIGH (ref 70–99)
Glucose-Capillary: 153 mg/dL — ABNORMAL HIGH (ref 70–99)
Glucose-Capillary: 175 mg/dL — ABNORMAL HIGH (ref 70–99)

## 2021-06-03 MED ORDER — LOPERAMIDE HCL 1 MG/7.5ML PO SUSP
4.0000 mg | Freq: Two times a day (BID) | ORAL | Status: DC
  Administered 2021-06-03: 4 mg
  Filled 2021-06-03 (×3): qty 30

## 2021-06-03 MED ORDER — MORPHINE SULFATE (PF) 2 MG/ML IV SOLN: 2.0000 mg | INTRAVENOUS | Status: DC | PRN

## 2021-06-03 MED ORDER — OXYCODONE HCL 5 MG/5ML PO SOLN
5.0000 mg | ORAL | Status: DC
Start: 2021-06-03 — End: 2021-06-09
  Administered 2021-06-03 – 2021-06-09 (×36): 5 mg
  Filled 2021-06-03 (×38): qty 5

## 2021-06-03 MED ORDER — FUROSEMIDE 10 MG/ML IJ SOLN
40.0000 mg | Freq: Four times a day (QID) | INTRAMUSCULAR | Status: AC
  Administered 2021-06-03 (×2): 40 mg via INTRAVENOUS
  Filled 2021-06-03 (×2): qty 4

## 2021-06-03 MED ORDER — QUETIAPINE FUMARATE 50 MG PO TABS
50.0000 mg | ORAL_TABLET | Freq: Two times a day (BID) | ORAL | Status: DC
  Administered 2021-06-03 (×2): 50 mg
  Filled 2021-06-03: qty 1
  Filled 2021-06-03: qty 2

## 2021-06-03 MED ORDER — FLUCONAZOLE 150 MG PO TABS
150.0000 mg | ORAL_TABLET | Freq: Once | ORAL | Status: AC
  Administered 2021-06-03: 150 mg
  Filled 2021-06-03: qty 1

## 2021-06-03 MED ORDER — POTASSIUM CHLORIDE 20 MEQ PO PACK
40.0000 meq | PACK | ORAL | Status: AC
  Administered 2021-06-03 (×2): 40 meq
  Filled 2021-06-03 (×2): qty 2

## 2021-06-03 NOTE — Progress Notes (Signed)
Physical Therapy Treatment ?Patient Details ?Name: Cassidy Tran ?MRN: [id number] ?DOB: May 16, 1975 ?Today's Date: 06/03/2021 ? ? ?History of Present Illness 46 y.o. female presents to [facility] hospital on 05/05/2021 and underwent ex-lap with cholecystectomy and R hemicolectomy for neuroendocrine tumor. Pt developed respiratory failure due to volume overload and required intubation on 3/16. Pt developed AMS during admission with rising white cell counts, underwent lumbar punture on 3/16. Pt underwent bronchoscopy on 3/21. Bronchoscopy 4/1, tracheostomy 4/3. PMH includes ASCUS, clotting disorder, seizures, PNA, Von Willebrand disease, VP shunt placement. ? ?  ?PT Comments  ? ? Pt limited by lethargy and fatigue after working with OT, SLP, and being assisted in bathing by RN. Pt requires significant physical assistance to perform all functional mobility at this time, but does tolerate sitting in recliner for >1 hour today. PT encourages continued therapeutic exercise with assistance of family.   ?Recommendations for follow up therapy are one component of a multi-disciplinary discharge planning process, led by the attending physician.  Recommendations may be updated based on patient status, additional functional criteria and insurance authorization. ? ?Follow Up Recommendations ? Skilled nursing-short term rehab (<3 hours/day) ?  ?  ?Assistance Recommended at Discharge Frequent or constant Supervision/Assistance  ?Patient can return home with the following Two people to help with walking and/or transfers;Two people to help with bathing/dressing/bathroom;Assistance with cooking/housework;Assistance with feeding;Direct supervision/assist for medications management;Direct supervision/assist for financial management;Assist for transportation;Help with stairs or ramp for entrance ?  ?Equipment Recommendations ? Wheelchair (measurements PT);Wheelchair cushion (measurements PT);Hospital bed;Other (comment)  ?  ?Recommendations for  Other Services   ? ? ?  ?Precautions / Restrictions Precautions ?Precautions: Fall ?Precaution Comments: trach collar, watch HR ?Restrictions ?Weight Bearing Restrictions: No  ?  ? ?Mobility ? Bed Mobility ?Overal bed mobility: Needs Assistance ?Bed Mobility: Sit to Supine ?  ?  ?  ?Sit to supine: Total assist ?  ?  ?  ? ?Transfers ?Overall transfer level: Needs assistance ?Equipment used: 2 person hand held assist ?Transfers: Bed to chair/wheelchair/BSC ?  ?Stand pivot transfers: Total assist, +2 physical assistance ?  ?  ?  ?  ?General transfer comment: pt lethargic, limited effort noted by pt. Spouse present and assisting during transfer ?  ? ?Ambulation/Gait ?  ?  ?  ?  ?  ?  ?  ?  ? ? ?Stairs ?  ?  ?  ?  ?  ? ? ?Wheelchair Mobility ?  ? ?Modified Rankin (Stroke Patients Only) ?  ? ? ?  ?Balance Overall balance assessment: Needs assistance ?Sitting-balance support: Single extremity supported, Bilateral upper extremity supported, Feet supported ?Sitting balance-Leahy Scale: Poor ?Sitting balance - Comments: minG-minA at edge of bed briefly ?  ?  ?  ?  ?  ?  ?  ?  ?  ?  ?  ?  ?  ?  ?  ?  ? ?  ?Cognition Arousal/Alertness: Lethargic ?Behavior During Therapy: Flat affect ?Overall Cognitive Status: Difficult to assess ?Area of Impairment: Problem solving ?  ?  ?  ?  ?  ?  ?  ?  ?  ?  ?  ?  ?  ?  ?Problem Solving: Slow processing, Decreased initiation ?  ?  ?  ? ?  ?Exercises   ? ?  ?General Comments General comments (skin integrity, edema, etc.): pt on 8L 35% FiO2 trach collar ?  ?  ? ?Pertinent Vitals/Pain Pain Assessment ?Pain Assessment: CPOT ?Facial Expression: Relaxed, neutral ?Body Movements: Absence of  movements ?Muscle Tension: Relaxed ?Compliance with ventilator (intubated pts.): N/A ?Vocalization (extubated pts.): N/A ?CPOT Total: 0  ? ? ?Home Living   ?  ?  ?  ?  ?  ?  ?  ?  ?  ?   ?  ?Prior Function    ?  ?  ?   ? ?PT Goals (current goals can now be found in the care plan section) Acute Rehab PT  Goals ?Patient Stated Goal: to improve strength and reduce caregiver burden ?Progress towards PT goals: Progressing toward goals (slowly) ? ?  ?Frequency ? ? ? Min 3X/week ? ? ? ?  ?PT Plan Current plan remains appropriate  ? ? ?Co-evaluation   ?  ?  ?  ?  ? ?  ?AM-PAC PT "6 Clicks" Mobility   ?Outcome Measure ? Help needed turning from your back to your side while in a flat bed without using bedrails?: Total ?Help needed moving from lying on your back to sitting on the side of a flat bed without using bedrails?: Total ?Help needed moving to and from a bed to a chair (including a wheelchair)?: Total ?Help needed standing up from a chair using your arms (e.g., wheelchair or bedside chair)?: Total ?Help needed to walk in hospital room?: Total ?Help needed climbing 3-5 steps with a railing? : Total ?6 Click Score: 6 ? ?  ?End of Session Equipment Utilized During Treatment: Oxygen ?Activity Tolerance: Patient limited by fatigue;Patient limited by lethargy ?Patient left: in bed;with call bell/phone within reach;with bed alarm set;with family/visitor present;with nursing/sitter in room ?Nurse Communication: Mobility status;Need for lift equipment ?PT Visit Diagnosis: Muscle weakness (generalized) (M62.81);Pain;Other abnormalities of gait and mobility (R26.89) ?  ? ? ?Time: 1216-1227 ?PT Time Calculation (min) (ACUTE ONLY): 11 min ? ?Charges:  $Therapeutic Activity: 8-22 mins          ?          ? ?Zenaida Niece, PT, DPT ?Acute Rehabilitation ?Pager: [phone] ?Office [phone] ? ? ? ?Zenaida Niece ?06/03/2021, 1:07 PM ? ?

## 2021-06-03 NOTE — Progress Notes (Signed)
Speech Language Pathology Treatment:    ?Patient Details ?Name: Cassidy Tran ?MRN: [id number] ?DOB: December 18, 1975 ?Today's Date: 06/03/2021 ?Time: 1046-1100 ?SLP Time Calculation (min) (ACUTE ONLY): 14 min ? ?Assessment / Plan / Recommendation ?Clinical Impression ? PT was seen for PMSV treatment with her husband present. She was seated upright in recliner and was seen immediately after OT's session. Pt's level of alertness was suboptimal and pt's husband attributed this to her having received Seroquel prior to OT's session. Vitals were RR 22, SpO2 96, and HR 120 at baseline and RR 21, SpO2 95, and HR 119 following cuff deflation. She was able to expectorate whitish-clear secretions via trach following cuff deflation. She tolerated PMSV for 5 minutes with vitals ranging was RR 23-28, SpO2 91-95, and HR 121-127 during this period. Pt was able to count to 5 with minimal voicing, but she improved vocal intensity with verbal prompts and models. Pt's level of alertness began to wane further and SpO2 reduced to 88-90% with her waning alertness. PMSV was removed and cuff was re-inflated by RT who arrived towards the end of the session. It is recommended that PMSV be used only with SLP at this time and that cuff be deflated as much as pt can tolerate. SLP will continue to follow pt.  ?  ?HPI HPI: 46 yo female smoker with neuroendocrine tumor of the terminal ileum presented for planned cholecystectomy with Rt hemicolectomy which was performed on 05/05/21.  Post op course complicated by HCAP with hypoxia requiring intubation and AMS from elevated ammonia 2nd to seizure medications. Trach and PEG placed 3/29.  Pt with prior medical history of Asthma, Epilepsy, Chiari malformation s/p VPS, Epilepsy, Von Willebrand disease, Biliary dyskinesia, Neuroendocrine tumor. ?  ?   ?SLP Plan ? Continue with current plan of care ? ?  ?  ?Recommendations for follow up therapy are one component of a multi-disciplinary discharge planning process,  led by the attending physician.  Recommendations may be updated based on patient status, additional functional criteria and insurance authorization. ?  ? ?Recommendations  ?   ?   ? Patient may use Passy-Muir Speech Valve: with SLP only ?PMSV Supervision: Full ?MD: Please consider changing trach tube to : Cuffless  ?   ? ? ? ? Oral Care Recommendations: Oral care BID ?Follow Up Recommendations: Acute inpatient rehab (3hours/day) ?Assistance recommended at discharge: Frequent or constant Supervision/Assistance ?SLP Visit Diagnosis: Aphonia (R49.1) ?Plan: Continue with current plan of care ? ? ? ? ?  ?  ?Everardo Voris I. Hardin Negus, [place], CCC-SLP ?Acute Rehabilitation Services ?Office number [phone] ?Pager [phone] ? ? ?Cassidy Tran ? ?06/03/2021, 11:21 AM ? ? ? ?

## 2021-06-03 NOTE — Progress Notes (Signed)
Occupational Therapy Treatment ?Patient Details ?Name: Cassidy Tran ?MRN: [id number] ?DOB: 02/22/1976 ?Today's Date: 06/03/2021 ? ? ?History of present illness 46 y.o. female presents to [facility] hospital on 05/05/2021 and underwent ex-lap with cholecystectomy and R hemicolectomy for neuroendocrine tumor. Pt developed respiratory failure due to volume overload and required intubation on 3/16. Pt developed AMS during admission with rising white cell counts, underwent lumbar punture on 3/16. Pt underwent bronchoscopy on 3/21. Bronchoscopy 4/1, tracheostomy 4/3. PMH includes ASCUS, clotting disorder, seizures, PNA, Von Willebrand disease, VP shunt placement. ?  ?OT comments ? Patient able to progress to out of bed, and to the recliner this date.  Mod A to stand, and Max A for stand pivot.  Patient unable to weight shift and step.  Patient remains very flat with occasional eye contact.  Deficits impacting independence remain, and are listed below.  OT will follow in the acute setting to maximize her functional status, and AIR continues to be recommended for aggressive post acute rehab.  No RW in room, may benefit from [facility] for transfers.    ? ?Recommendations for follow up therapy are one component of a multi-disciplinary discharge planning process, led by the attending physician.  Recommendations may be updated based on patient status, additional functional criteria and insurance authorization. ?   ?Follow Up Recommendations ? Acute inpatient rehab (3hours/day)  ?  ?Assistance Recommended at Discharge Frequent or constant Supervision/Assistance  ?Patient can return home with the following ? Two people to help with walking and/or transfers;A lot of help with bathing/dressing/bathroom;Assistance with cooking/housework;Assistance with feeding;Direct supervision/assist for medications management;Help with stairs or ramp for entrance;Assist for transportation;Direct supervision/assist for financial management ?  ?Equipment  Recommendations ? Wheelchair cushion (measurements OT);Wheelchair (measurements OT);BSC/3in1;Tub/shower seat  ?  ?Recommendations for Other Services   ? ?  ?Precautions / Restrictions Precautions ?Precautions: Fall ?Precaution Comments: trach collar, watch HR ?Restrictions ?Weight Bearing Restrictions: No  ? ? ?  ? ?Mobility Bed Mobility ?Overal bed mobility: Needs Assistance ?Bed Mobility: Sidelying to Sit ?  ?Sidelying to sit: Mod assist, HOB elevated ?  ?  ?  ?General bed mobility comments: increased time and encouragement.  Assist to bring legs off, advance hips and for trunk. ?Patient Response: Flat affect ? ?Transfers ?Overall transfer level: Needs assistance ?  ?Transfers: Sit to/from Stand, Bed to chair/wheelchair/BSC ?Sit to Stand: Mod assist ?Stand pivot transfers: Max assist ?  ?  ?  ?  ?General transfer comment: difficulty weight shifting and moving feet - no RW in room ?  ?  ?Balance Overall balance assessment: Needs assistance ?Sitting-balance support: Feet supported ?Sitting balance-Leahy Scale: Fair ?  ?  ?Standing balance support: Bilateral upper extremity supported ?Standing balance-Leahy Scale: Poor ?  ?  ?  ?  ?  ?  ?  ?  ?  ?  ?  ?  ?   ? ?ADL either performed or assessed with clinical judgement  ? ?ADL   ?  ?  ?  ?  ?  ?  ?  ?  ?Upper Body Dressing : Moderate assistance;Sitting ?  ?Lower Body Dressing: Maximal assistance;Sitting/lateral leans ?  ?  ?  ?  ?  ?  ?  ?  ?  ?  ? ?Extremity/Trunk Assessment Upper Extremity Assessment ?Upper Extremity Assessment: Generalized weakness ?RUE Deficits / Details: limited shoulder flexion, difficulty bringing hands to mouth, weak grip. ? effort ?LUE Deficits / Details: limited shoulder flexion, difficulty bringing hands to mouth, weak grip. ? effort ?  ?  Lower Extremity Assessment ?Lower Extremity Assessment: Defer to PT evaluation ?  ?  ?  ? ?Vision   ?  ?  ?Perception   ?  ?Praxis   ?  ? ?Cognition Arousal/Alertness: Awake/alert ?Behavior During Therapy:  Flat affect ?Overall Cognitive Status: Within Functional Limits for tasks assessed ?  ?  ?  ?  ?  ?  ?  ?  ?  ?  ?  ?  ?  ?  ?  ?  ?General Comments: answering appripriately with thumbs up/down, following commands ?  ?  ?   ?Exercises   ? ?  ?Shoulder Instructions   ? ? ?  ?General Comments    ? ? ?Pertinent Vitals/ Pain       Pain Assessment ?Pain Assessment: Faces ?Faces Pain Scale: No hurt ?Pain Intervention(s): Monitored during session ? ?   ?  ?  ?  ?  ?  ?  ?  ?  ?  ?  ?  ?  ?  ?  ?  ?  ?  ?  ? ?  ?    ?  ?  ?  ?   ? ?Frequency ? Min 2X/week  ? ? ? ? ?  ?Progress Toward Goals ? ?OT Goals(current goals can now be found in the care plan section) ? Progress towards OT goals: Progressing toward goals ? ?Acute Rehab OT Goals ?OT Goal Formulation: With patient ?Time For Goal Achievement: 06/16/21 ?Potential to Achieve Goals: Good  ?Plan Discharge plan remains appropriate   ? ?Co-evaluation ? ? ?   ?  ?  ?  ?  ? ?  ?AM-PAC OT "6 Clicks" Daily Activity     ?Outcome Measure ? ? Help from another person eating meals?: Total ?Help from another person taking care of personal grooming?: A Lot ?Help from another person toileting, which includes using toliet, bedpan, or urinal?: A Lot ?Help from another person bathing (including washing, rinsing, drying)?: A Lot ?Help from another person to put on and taking off regular upper body clothing?: A Lot ?Help from another person to put on and taking off regular lower body clothing?: A Lot ?6 Click Score: 11 ? ?  ?End of Session Equipment Utilized During Treatment: Oxygen ? ?OT Visit Diagnosis: Unsteadiness on feet (R26.81);Muscle weakness (generalized) (M62.81);Dizziness and giddiness (R42);Pain ?  ?Activity Tolerance Patient tolerated treatment well ?  ?Patient Left in chair;with call bell/phone within reach;with family/visitor present ?  ?Nurse Communication Mobility status ?  ? ?   ? ?Time: 6433-2951 ?OT Time Calculation (min): 24 min ? ?Charges: OT General Charges ?$OT Visit:  1 Visit ?OT Treatments ?$Therapeutic Activity: 23-37 mins ? ?06/03/2021 ? ?RP, OTR/L ? ?Acute Rehabilitation Services ? ?Office:  [phone] ? ? ?Amond Speranza D Ivar Domangue ?06/03/2021, 10:52 AM ?

## 2021-06-03 NOTE — Progress Notes (Signed)
? ?Trauma/Critical Care Follow Up Note ? ?Subjective:  ?  ?Overnight Issues:  ? ?Objective:  ?Vital signs for last 24 hours: ?Temp:  [98.9 ?F (37.2 ?C)-100.8 ?F (38.2 ?C)] 99.9 ?F (37.7 ?C) (04/07 0400) ?Pulse Rate:  [78-129] 116 (04/07 0700) ?Resp:  [16-28] 22 (04/07 0700) ?BP: (110-163)/(48-86) 147/55 (04/07 0700) ?SpO2:  [90 %-100 %] 100 % (04/07 0700) ?FiO2 (%):  [35 %-60 %] 35 % (04/07 0330) ?Weight:  [85.6 kg] 85.6 kg (04/07 0455) ? ?Hemodynamic parameters for last 24 hours: ?CVP:  [0 mmHg-3 mmHg] 0 mmHg ? ?Intake/Output from previous day: ?04/06 0701 - 04/07 0700 ?In: 1774.4 [I.V.:65.5; NG/GT:1026; IV Piggyback:82.8] ?Out: 1925 [KGURK:2706]  ?Intake/Output this shift: ?No intake/output data recorded. ? ?Vent settings for last 24 hours: ?FiO2 (%):  [35 %-60 %] 35 % ? ?Physical Exam:  ?Gen: comfortable, no distress ?Neuro: non-focal exam ?HEENT: PERRL ?Neck: supple ?CV: RRR ?Pulm: unlabored breathing on TC ?Abd: soft, NT, PEG ?GU: clear yellow urine ?Extr: wwp, no edema ? ? ?Results for orders placed or performed during the hospital encounter of 05/05/21 (from the past 24 hour(s))  ?Glucose, capillary     Status: Abnormal  ? Collection Time: 06/02/21  8:45 AM  ?Result Value Ref Range  ? Glucose-Capillary 134 (H) 70 - 99 mg/dL  ?Glucose, capillary     Status: Abnormal  ? Collection Time: 06/02/21 11:29 AM  ?Result Value Ref Range  ? Glucose-Capillary 117 (H) 70 - 99 mg/dL  ?Glucose, capillary     Status: Abnormal  ? Collection Time: 06/02/21  4:12 PM  ?Result Value Ref Range  ? Glucose-Capillary 117 (H) 70 - 99 mg/dL  ?Glucose, capillary     Status: Abnormal  ? Collection Time: 06/02/21  7:32 PM  ?Result Value Ref Range  ? Glucose-Capillary 168 (H) 70 - 99 mg/dL  ?Glucose, capillary     Status: Abnormal  ? Collection Time: 06/02/21 11:29 PM  ?Result Value Ref Range  ? Glucose-Capillary 153 (H) 70 - 99 mg/dL  ?Glucose, capillary     Status: Abnormal  ? Collection Time: 06/03/21  3:10 AM  ?Result Value Ref Range   ? Glucose-Capillary 153 (H) 70 - 99 mg/dL  ?CBC     Status: Abnormal  ? Collection Time: 06/03/21  6:07 AM  ?Result Value Ref Range  ? WBC 23.8 (H) 4.0 - 10.5 K/uL  ? RBC 3.19 (L) 3.87 - 5.11 MIL/uL  ? Hemoglobin 9.1 (L) 12.0 - 15.0 g/dL  ? HCT 29.2 (L) 36.0 - 46.0 %  ? MCV 91.5 80.0 - 100.0 fL  ? MCH 28.5 26.0 - 34.0 pg  ? MCHC 31.2 30.0 - 36.0 g/dL  ? RDW 14.6 11.5 - 15.5 %  ? Platelets 511 (H) 150 - 400 K/uL  ? nRBC 0.0 0.0 - 0.2 %  ?Basic metabolic panel     Status: Abnormal  ? Collection Time: 06/03/21  6:07 AM  ?Result Value Ref Range  ? Sodium 141 135 - 145 mmol/L  ? Potassium 3.7 3.5 - 5.1 mmol/L  ? Chloride 105 98 - 111 mmol/L  ? CO2 29 22 - 32 mmol/L  ? Glucose, Bld 151 (H) 70 - 99 mg/dL  ? BUN 14 6 - 20 mg/dL  ? Creatinine, Ser 0.57 0.44 - 1.00 mg/dL  ? Calcium 8.4 (L) 8.9 - 10.3 mg/dL  ? GFR, Estimated >60 >60 mL/min  ? Anion gap 7 5 - 15  ? ? ?Assessment & Plan: ? ?Present on Admission: ?  Neuroendocrine carcinoma ([place]) ? ? ? LOS: 29 days  ? ?Additional comments:I reviewed the patient's new clinical lab test results.   and I reviewed the patients new imaging test results.   ? ?Neuroendocrine tumor on colonoscopy - s/p exlap, R hemicolectomy, cholecystectomy 3/9. Well differentiated neuroendocrine tumor with 1/7 +nodes. D/w Dr. Marin Olp 3/15 will likely need adjuvant chemo. Staples out 3/24. Bottom 2cm of wound packed. ?Respiratory failure 2/2 volume overload/?PNA - CT chest 3/11 and 3/13 with no PE. Intubated 3/16, suspect PNA based on CXR but negative resp cx and BAL, s/p 7d empiric merrem. Decompensation 3/26, likely will need trach when settings lower, d/w husband. Discussed this patient with pulmonology 3/29 AM, recommend continuing current care, no abx unless organism identified. Repeat pulm c/s 4/1, brovana, yupelri, singulair added. Trach 4/4. ?Volume overload - previously resolved, but 3/24 PM, went into flash pulmonary edema. IV lasix BID dosing, goal even to 500 neg/24h, transition to PO 4/8.  Echo 3/28, essentially normal.  ?Altered mental status - CT head 3/13, 3/16/ 3/17 normal (h/o VP shunt). Changed cefepime to zosyn 3/13, total 7d course-end today, empiric coverage for PNA. Resp cx today. Ucx 3/13 negative. Bcx 3/15 NGTD. LP 3/16 NGTD. CT A/P 3/16 negative. 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 Neurology on board, changed to keppra, cont EEG without sz, encephalopathy much improved  ?Anxiety - on valium 10q6, off dex, incr seroquel today ?Diarrhea - imodium TID, lower to BID, cont cholestyramine TID, d/c lomotil ?vWD - humate P per Heme, end 3/12, stable ?Transaminitis - likely 2/2 TPN, now d/c'd ?Leukocytosis, fever - 23 this AM, prior infectious workup unrevealing, curbside ID consult 3/28 with no add'l recs, LE duplex negative 3/31-recheck, check CXR and give 150 diflucan for vaginal yeast infection concerns ?FEN - NPO, OGT, tol TF at goal, abd pain improved, replete K ?ABLA - stable ?DVT - SCDs, s/p DDVAP, LMWH, recheck LE duplex, negative 3/31 ?Foley - d/c 4/5, spont voids ?Dispo - 4NP, PT/OT/SLP, anticipate LTAC ? ? ?[name], MD ?Trauma & General Surgery ?Please use [email or web address] to contact on call provider ? ?06/03/2021 ? ?*Care during the described time interval was provided by me. I have reviewed this patient's available data, including medical history, events of note, physical examination and test results as part of my evaluation. ? ? ? ?

## 2021-06-03 NOTE — Progress Notes (Signed)
? ?NAME:  Cassidy Tran, MRN:  [id number], DOB:  [birth year], LOS: 71 ?ADMISSION DATE:  05/05/2021, CONSULTATION DATE:  05/28/21 ?REFERRING MD:  Bobbye Morton, CHIEF COMPLAINT:  acute hypoxic respiratory failure ? ?History of Present Illness:  ?46 yo female smoker with neuroendocrine tumor of the terminal ileum presented for planned cholecystectomy with Rt hemicolectomy which was performed on 05/05/21.  Post op course complicated by HCAP with hypoxia requiring intubation and AMS from elevated ammonia 2nd to seizure medications. ? ?Pertinent  Medical History  ?Asthma, Epilepsy, Chiari malformation s/p VPS, Epilepsy, Von Willebrand disease, Biliary dyskinesia, Neuroendocrine tumor ? ?Significant Hospital Events: ?Including procedures, antibiotic start and stop dates in addition to other pertinent events   ?3/09 ex lap, R hemicolectomy, chole ?3/10 oncology consulted ?3/11 respiratory distress, transfer to ICU, start cefepime ?3/13 concern for AMS due to hyperammonemia ?3/15 neurosurgery consult to determine if issue with VP shunt contributing to AMS ?3/16 intubation, neurology consulted, LP performed ?3/21 bronchoscopy ?3/26 ETT exchange ?4/01 bronchoscopy ?4/03 tracheostomy ?4/04 off vent ?407 transfer orders placed for PCU ? ?Studies:  ?CT angio chest 3/13 >> extensive GGO and interlobular septal thickening ?CT chest 3/27 >> stable multiple airspace opacities ?Echo 3/28 >> EF 60 to 65%, mod LVH ?Doppler legs b/l 3/31 >> no DVT ? ?Interim History / Subjective:  ?No overnight issues. Husband at bedside, having abdominal pain. Titrating bowel regiment per surgery.  ?Discussed with RN and RRT - minimal secretions, thin, white. Occasionally has episodes of deesaturation and emesis.  ?Tolerating nebs.  ? ?Objective   ?Blood pressure (!) 145/62, pulse (!) 110, temperature 99.9 ?F (37.7 ?C), temperature source Oral, resp. rate 17, height '5\' 3"'$  (1.6 m), weight 85.6 kg, last menstrual period 09/16/2012, SpO2 100 %. ?CVP:  [0 mmHg-3  mmHg] 1 mmHg  ?FiO2 (%):  [35 %-60 %] 35 %  ? ?Intake/Output Summary (Last 24 hours) at 06/03/2021 0826 ?Last data filed at 06/03/2021 0800 ?Gross per 24 hour  ?Intake 1793.96 ml  ?Output 1925 ml  ?Net -131.04 ml  ? ?Filed Weights  ? 05/31/21 1600 06/02/21 0444 06/03/21 0455  ?Weight: 84.4 kg 86 kg 85.6 kg  ? ? ?Examination: ? ?Gen:      Chronically ill, no visible distress.  ?HEENT:  mmm. Cuffed trach in place.  ?Lungs:   ctab no wheezes or crackles ?CV:         tachycardic, regular no mrg ?Abd:      Soft, abdominal binder in place, mild ttp ?Ext:    No edema ?Skin:      Warm and dry; no rashes ?Neuro:   RASS 0 ? ? ?Resolved Hospital Problem list   ? ? ?Assessment & Plan:  ?[name] is a 46 y.o. woman with history of severe asthma who presents with prolonged hospital stay secondary to neuroendocrine tumor of terminal ileum now s/p colectomy. PCCM following for prolonged respiratory failure and asthma.  ? ?Acute hypoxic respiratory failure 2nd to HCAP. ?Hx of asthma, tobacco abuse. ?Failure to wean from ventilator s/p tracheostomy on 4/03. ?- completed ABx 3/27 ?- trach collar as tolerated with prn vent support  ?- goal SpO2 90 to 95% ?- continue yupelri, brovana, pulmicort, singulair ?- prn albuterol ?- continue SLP - can consider transition to cuffless trach next week.  ? ?Updated pt's husband at bedside. Patient is being transferred to progressive care today. PCCM will follow Monday. We can set up for outpatient follow up for asthma as she is closer to discharge.  ? ?Best  Practice (right click and "Reselect all SmartList Selections" daily)  ? ?Per primary ? ?Labs:  ? ? ?  Latest Ref Rng & Units 06/03/2021  ?  6:07 AM 06/02/2021  ?  4:30 AM 06/01/2021  ?  5:56 AM  ?CMP  ?Glucose 70 - 99 mg/dL 151   138   132    ?BUN 6 - 20 mg/dL '14   19   22    '$ ?Creatinine 0.44 - 1.00 mg/dL 0.57   0.66   0.80    ?Sodium 135 - 145 mmol/L 141   140   138    ?Potassium 3.5 - 5.1 mmol/L 3.7   3.1   3.8    ?Chloride 98 - 111 mmol/L 105    99   98    ?CO2 22 - 32 mmol/L 29   30   32    ?Calcium 8.9 - 10.3 mg/dL 8.4   8.4   8.5    ?Total Protein 6.5 - 8.1 g/dL  7.0   6.9    ?Total Bilirubin 0.3 - 1.2 mg/dL  0.5   0.5    ?Alkaline Phos 38 - 126 U/L  237   236    ?AST 15 - 41 U/L  19   21    ?ALT 0 - 44 U/L  36   42    ? ? ? ?  Latest Ref Rng & Units 06/03/2021  ?  6:07 AM 06/02/2021  ?  4:30 AM 06/01/2021  ?  5:56 AM  ?CBC  ?WBC 4.0 - 10.5 K/uL 23.8   17.2   15.5    ?Hemoglobin 12.0 - 15.0 g/dL 9.1   9.3   8.7    ?Hematocrit 36.0 - 46.0 % 29.2   29.3   27.7    ?Platelets 150 - 400 K/uL 511   463   416    ? ? ?ABG ?   ?Component Value Date/Time  ? PHART 7.476 (H) 05/31/2021 0453  ? PCO2ART 55.7 (H) 05/31/2021 0453  ? PO2ART 102 05/31/2021 0453  ? HCO3 41.2 (H) 05/31/2021 0453  ? TCO2 43 (H) 05/31/2021 0453  ? O2SAT 98 05/31/2021 0453  ? ? ?CBG (last 3)  ?Recent Labs  ?  06/02/21 ?2329 06/03/21 ?0310 06/03/21 ?0165  ?GLUCAP 153* 153* 175*  ? ? ?Signature:  ? ?Lenice Llamas, MD ?Pulmonary and Critical Care Medicine ?[place] ?06/03/2021 8:26 AM ?Pager: see AMION ? ?If no response to pager, please call critical care on call (see AMION) until 7pm ?After 7:00 pm call Elink   ? ? ? ?  ?

## 2021-06-04 ENCOUNTER — Inpatient Hospital Stay (HOSPITAL_COMMUNITY): Payer: No Typology Code available for payment source

## 2021-06-04 DIAGNOSIS — R509 Fever, unspecified: Secondary | ICD-10-CM

## 2021-06-04 LAB — URINALYSIS, ROUTINE W REFLEX MICROSCOPIC
Bilirubin Urine: NEGATIVE
Glucose, UA: NEGATIVE mg/dL
Hgb urine dipstick: NEGATIVE
Ketones, ur: NEGATIVE mg/dL
Leukocytes,Ua: NEGATIVE
Nitrite: NEGATIVE
Protein, ur: NEGATIVE mg/dL
Specific Gravity, Urine: 1.019 (ref 1.005–1.030)
pH: 5 (ref 5.0–8.0)

## 2021-06-04 LAB — GLUCOSE, CAPILLARY
Glucose-Capillary: 106 mg/dL — ABNORMAL HIGH (ref 70–99)
Glucose-Capillary: 108 mg/dL — ABNORMAL HIGH (ref 70–99)
Glucose-Capillary: 117 mg/dL — ABNORMAL HIGH (ref 70–99)
Glucose-Capillary: 132 mg/dL — ABNORMAL HIGH (ref 70–99)
Glucose-Capillary: 134 mg/dL — ABNORMAL HIGH (ref 70–99)
Glucose-Capillary: 143 mg/dL — ABNORMAL HIGH (ref 70–99)

## 2021-06-04 LAB — CBC
HCT: 28.2 % — ABNORMAL LOW (ref 36.0–46.0)
Hemoglobin: 8.9 g/dL — ABNORMAL LOW (ref 12.0–15.0)
MCH: 28.7 pg (ref 26.0–34.0)
MCHC: 31.6 g/dL (ref 30.0–36.0)
MCV: 91 fL (ref 80.0–100.0)
Platelets: 419 10*3/uL — ABNORMAL HIGH (ref 150–400)
RBC: 3.1 MIL/uL — ABNORMAL LOW (ref 3.87–5.11)
RDW: 14.7 % (ref 11.5–15.5)
WBC: 20.2 10*3/uL — ABNORMAL HIGH (ref 4.0–10.5)
nRBC: 0 % (ref 0.0–0.2)

## 2021-06-04 LAB — BASIC METABOLIC PANEL
Anion gap: 9 (ref 5–15)
BUN: 14 mg/dL (ref 6–20)
CO2: 28 mmol/L (ref 22–32)
Calcium: 8.5 mg/dL — ABNORMAL LOW (ref 8.9–10.3)
Chloride: 104 mmol/L (ref 98–111)
Creatinine, Ser: 0.64 mg/dL (ref 0.44–1.00)
GFR, Estimated: >60 mL/min (ref >60)
Glucose, Bld: 156 mg/dL — ABNORMAL HIGH (ref 70–99)
Potassium: 4.7 mmol/L (ref 3.5–5.1)
Sodium: 141 mmol/L (ref 135–145)

## 2021-06-04 MED ORDER — QUETIAPINE FUMARATE 50 MG PO TABS
50.0000 mg | ORAL_TABLET | Freq: Three times a day (TID) | ORAL | Status: DC
  Administered 2021-06-04 – 2021-06-08 (×15): 50 mg
  Filled 2021-06-04 (×15): qty 1

## 2021-06-04 MED ORDER — LOPERAMIDE HCL 1 MG/7.5ML PO SUSP
2.0000 mg | Freq: Two times a day (BID) | ORAL | Status: DC
  Administered 2021-06-04 – 2021-06-05 (×3): 2 mg
  Filled 2021-06-04 (×6): qty 15

## 2021-06-04 MED ORDER — QUETIAPINE FUMARATE 50 MG PO TABS: 75.0000 mg | ORAL_TABLET | Freq: Two times a day (BID) | ORAL | Status: DC

## 2021-06-04 MED ORDER — PIPERACILLIN-TAZOBACTAM 3.375 G IVPB
3.3750 g | Freq: Three times a day (TID) | INTRAVENOUS | Status: DC
  Administered 2021-06-04 – 2021-06-09 (×14): 3.375 g via INTRAVENOUS
  Filled 2021-06-04 (×14): qty 50

## 2021-06-04 MED ORDER — CHOLESTYRAMINE 4 G PO PACK
4.0000 g | PACK | Freq: Two times a day (BID) | ORAL | Status: DC
  Administered 2021-06-04 (×2): 4 g
  Filled 2021-06-04 (×3): qty 1

## 2021-06-04 MED ORDER — FUROSEMIDE 40 MG PO TABS
80.0000 mg | ORAL_TABLET | Freq: Two times a day (BID) | ORAL | Status: DC
  Administered 2021-06-04 – 2021-06-15 (×23): 80 mg
  Filled 2021-06-04 (×23): qty 2

## 2021-06-04 NOTE — Progress Notes (Signed)
Patient had not urinated this shift so far; bladder scan done showing 320cc.  Will continue to monitor. ?

## 2021-06-04 NOTE — Progress Notes (Addendum)
Pharmacy Antibiotic Note ? ?Cassidy Tran is a 46 y.o. female admitted on 05/05/2021 for scheduled ex-lap with hemicolectomy on 3/9.  Patient completed a course of antibiotic for PNA.  Now vomiting and  Pharmacy has been consulted for Zosyn dosing for aspiration PNA.  Tolerated Zosyn earlier this admission. ? ?Renal function stable, afebrile today, WBC elevated at 20.2. ? ?Plan: ?Zosyn EID 3.375gm IV Q8H ?Pharmacy will sign off with stable renal fxn.  Thank you for the consult! ? ?Height: _0  (160 cm) ?Weight: 87 kg (191 lb 12.8 oz) ?IBW/kg (Calculated) : 52.4 ? ?Temp (24hrs), Avg:99.5 ?F (37.5 ?C), Min:98.9 ?F (37.2 ?C), Max:99.9 ?F (37.7 ?C) ? ?Recent Labs  ?Lab 05/31/21 ?2355 05/31/21 ?1609 06/01/21 ?7322 06/02/21 ?0430 06/03/21 ?0254 06/04/21 ?0216  ?WBC 14.4*  --  15.5* 17.2* 23.8* 20.2*  ?CREATININE 0.80 0.68 0.80 0.66 0.57 0.64  ?  ?Estimated Creatinine Clearance: 90.9 mL/min (by C-G formula based on SCr of 0.64 mg/dL).   ? ?Allergies  ?Allergen Reactions  ? Aspirin Other (See Comments)  ?  Childhood reaction  ? Codeine Nausea And Vomiting  ?  Tolerates Hydrocodone  ? Erythromycin Other (See Comments)  ?  Childhood reaction, tolerate Zpak ?Childhood reaction does not know.  ? Lactose Intolerance (Gi) Diarrhea  ? Nsaids Nausea And Vomiting  ? Penicillins Other (See Comments)  ?  Has patient had a PCN reaction causing immediate rash, facial/tongue/throat swelling, SOB or lightheadedness with hypotension: doesn't remember childhood Reaction  ?Has patient had a PCN reaction causing severe rash involving mucus membranes or skin necrosis: NO ?Has patient had a PCN reaction that required hospitalization NO ?Has patient had a PCN reaction occurring within the last 10 years: NO ?If all of the above answers are "NO", then may proceed with Cephalosporin use. ?  ? ?Cefepime 3/11 >> 3/13 ?Zosyn 3/13 >> 3/18, restarted 4/8 >> ?Meropenem 3/21>>3/28 ?Fluconazole 4/7 x 1 ?  ?3/13 UC - neg ?3/15 BCx - neg ?3/16- CSF cell ct  - too few to count ?3/16 CSF cx - neg ?3/18 TA - few candida ?3/21 BAL-50kcol nl resp flora ?4/1 BAL fungal - candida alb; continuing to hold ?4/1 PCP - neg ? ?Jadine Brumley D. Mina Marble, PharmD, BCPS, BCCCP ?06/04/2021, 2:55 PM ? ?

## 2021-06-04 NOTE — Progress Notes (Signed)
BLE venous duplex has been completed. ? ?Results can be found under chart review under CV PROC. ?06/04/2021 1:14 PM ?Avedis Bevis RVT, RDMS ? ?

## 2021-06-04 NOTE — Progress Notes (Signed)
Patient vomited tube feeds. Now more tachycardic. Also hasn't voided in 8 h. ?-chest XR ?-place foley ?-hold tube feeds ?-restart empiric abx ?

## 2021-06-04 NOTE — Progress Notes (Signed)
? ?Trauma/Critical Care Follow Up Note ? ?Subjective:  ?  ?Overnight Issues:  ? ?Objective:  ?Vital signs for last 24 hours: ?Temp:  [98.9 ?F (37.2 ?C)-100.1 ?F (37.8 ?C)] 99.9 ?F (37.7 ?C) (04/08 8341) ?Pulse Rate:  [104-128] 128 (04/08 0717) ?Resp:  [14-33] 29 (04/08 0717) ?BP: (122-154)/(52-79) 154/61 (04/08 0717) ?SpO2:  [93 %-100 %] 98 % (04/08 0717) ?FiO2 (%):  [35 %] 35 % (04/08 0355) ?Weight:  [87 kg] 87 kg (04/08 0425) ? ?Hemodynamic parameters for last 24 hours: ?CVP:  [0 mmHg-14 mmHg] 0 mmHg ? ?Intake/Output from previous day: ?04/07 0701 - 04/08 0700 ?In: 2169 [NG/GT:1719] ?Out: 2110 [Urine:2110]  ?Intake/Output this shift: ?No intake/output data recorded. ? ?Vent settings for last 24 hours: ?FiO2 (%):  [35 %] 35 % ? ?Physical Exam:  ?Gen: comfortable, no distress ?Neuro: non-focal exam, f/c ?HEENT: PERRL ?Neck: supple ?CV: tachycardic ?Pulm: unlabored breathing on TC ?Abd: soft, NT, PEG, inferior aspect of wound granulating ?GU: clear yellow urine ?Extr: wwp, no edema ? ?Results for orders placed or performed during the hospital encounter of 05/05/21 (from the past 24 hour(s))  ?Glucose, capillary     Status: Abnormal  ? Collection Time: 06/03/21 11:12 AM  ?Result Value Ref Range  ? Glucose-Capillary 131 (H) 70 - 99 mg/dL  ?Glucose, capillary     Status: Abnormal  ? Collection Time: 06/03/21  3:19 PM  ?Result Value Ref Range  ? Glucose-Capillary 138 (H) 70 - 99 mg/dL  ?Glucose, capillary     Status: Abnormal  ? Collection Time: 06/03/21  7:36 PM  ?Result Value Ref Range  ? Glucose-Capillary 153 (H) 70 - 99 mg/dL  ?Glucose, capillary     Status: Abnormal  ? Collection Time: 06/03/21 11:35 PM  ?Result Value Ref Range  ? Glucose-Capillary 123 (H) 70 - 99 mg/dL  ?CBC     Status: Abnormal  ? Collection Time: 06/04/21  2:16 AM  ?Result Value Ref Range  ? WBC 20.2 (H) 4.0 - 10.5 K/uL  ? RBC 3.10 (L) 3.87 - 5.11 MIL/uL  ? Hemoglobin 8.9 (L) 12.0 - 15.0 g/dL  ? HCT 28.2 (L) 36.0 - 46.0 %  ? MCV 91.0 80.0 -  100.0 fL  ? MCH 28.7 26.0 - 34.0 pg  ? MCHC 31.6 30.0 - 36.0 g/dL  ? RDW 14.7 11.5 - 15.5 %  ? Platelets 419 (H) 150 - 400 K/uL  ? nRBC 0.0 0.0 - 0.2 %  ?Basic metabolic panel     Status: Abnormal  ? Collection Time: 06/04/21  2:16 AM  ?Result Value Ref Range  ? Sodium 141 135 - 145 mmol/L  ? Potassium 4.7 3.5 - 5.1 mmol/L  ? Chloride 104 98 - 111 mmol/L  ? CO2 28 22 - 32 mmol/L  ? Glucose, Bld 156 (H) 70 - 99 mg/dL  ? BUN 14 6 - 20 mg/dL  ? Creatinine, Ser 0.64 0.44 - 1.00 mg/dL  ? Calcium 8.5 (L) 8.9 - 10.3 mg/dL  ? GFR, Estimated >60 >60 mL/min  ? Anion gap 9 5 - 15  ?Glucose, capillary     Status: Abnormal  ? Collection Time: 06/04/21  3:34 AM  ?Result Value Ref Range  ? Glucose-Capillary 143 (H) 70 - 99 mg/dL  ? ? ?Assessment & Plan: ? ?Present on Admission: ? Neuroendocrine carcinoma ([place]) ? ? ? LOS: 30 days  ? ?Additional comments:I reviewed the patient's new clinical lab test results.   and I reviewed the patients new imaging test  results.   ? ?Neuroendocrine tumor on colonoscopy - s/p exlap, R hemicolectomy, cholecystectomy 3/9. Well differentiated neuroendocrine tumor with 1/7 +nodes. D/w Dr. Marin Olp 3/15 will likely need adjuvant chemo. Staples out 3/24. Bottom 2cm of wound granulating ?Respiratory failure 2/2 volume overload/?PNA - CT chest 3/11 and 3/13 with no PE. Intubated 3/16, suspect PNA based on CXR but negative resp cx and BAL, s/p 7d empiric merrem. Decompensation 3/26, likely will need trach when settings lower, d/w husband. Discussed this patient with pulmonology 3/29 AM, recommend continuing current care, no abx unless organism identified. Repeat pulm c/s 4/1, brovana, yupelri, singulair added. Trach 4/4. ?Volume overload - seems euvolemic, lasix BID, goal even to 500 neg/24h, transition to PO 4/8. Echo 3/28, essentially normal.  ?Altered mental status - CT head 3/13, 3/16/ 3/17 normal (h/o VP shunt). Changed cefepime to zosyn 3/13, total 7d course-end today, empiric coverage for PNA. Resp cx  today. Ucx 3/13 negative. Bcx 3/15 NGTD. LP 3/16 NGTD. CT A/P 3/16 negative. 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 Neurology on board, changed to keppra, cont EEG without sz, encephalopathy much improved  ?Anxiety - on valium 10q6, incr seroquel today ?Diarrhea - imodium BID, lower to 31m, decr cholestyramine to BID ?vWD - humate P per Heme, end 3/12, stable ?Transaminitis - likely 2/2 TPN, now d/c'd ?Leukocytosis, fever - 20 this AM, prior infectious workup unrevealing, curbside ID consult 3/28 with no add'l recs, LE duplex negative 3/31-recheck, CXR 4/7 unchanged, and 150 diflucan for vaginal yeast infection concerns given 4/7 ?FEN - NPO, OGT, tol TF at goal, abd pain improved when binder is loose ?ABLA - stable ?DVT - SCDs, s/p DDVAP, LMWH, recheck LE duplex, negative 3/31 ?Foley - d/c 4/5, spont voids ?Dispo - 4NP, PT/OT/SLP, anticipate LTAC ? ?AJesusita Oka MD ?Trauma & General Surgery ?Please use [email or web address] to contact on call provider ? ?06/04/2021 ? ?*Care during the described time interval was provided by me. I have reviewed this patient's available data, including medical history, events of note, physical examination and test results as part of my evaluation. ? ? ? ?

## 2021-06-05 ENCOUNTER — Inpatient Hospital Stay (HOSPITAL_COMMUNITY): Payer: No Typology Code available for payment source

## 2021-06-05 LAB — BASIC METABOLIC PANEL
Anion gap: 12 (ref 5–15)
BUN: 14 mg/dL (ref 6–20)
CO2: 29 mmol/L (ref 22–32)
Calcium: 8.9 mg/dL (ref 8.9–10.3)
Chloride: 99 mmol/L (ref 98–111)
Creatinine, Ser: 0.74 mg/dL (ref 0.44–1.00)
GFR, Estimated: >60 mL/min (ref >60)
Glucose, Bld: 113 mg/dL — ABNORMAL HIGH (ref 70–99)
Potassium: 4 mmol/L (ref 3.5–5.1)
Sodium: 140 mmol/L (ref 135–145)

## 2021-06-05 LAB — GLUCOSE, CAPILLARY
Glucose-Capillary: 112 mg/dL — ABNORMAL HIGH (ref 70–99)
Glucose-Capillary: 109 mg/dL — ABNORMAL HIGH (ref 70–99)
Glucose-Capillary: 125 mg/dL — ABNORMAL HIGH (ref 70–99)
Glucose-Capillary: 116 mg/dL — ABNORMAL HIGH (ref 70–99)
Glucose-Capillary: 104 mg/dL — ABNORMAL HIGH (ref 70–99)
Glucose-Capillary: 114 mg/dL — ABNORMAL HIGH (ref 70–99)

## 2021-06-05 LAB — CBC
HCT: 34.5 % — ABNORMAL LOW (ref 36.0–46.0)
Hemoglobin: 10.7 g/dL — ABNORMAL LOW (ref 12.0–15.0)
MCH: 28.2 pg (ref 26.0–34.0)
MCHC: 31 g/dL (ref 30.0–36.0)
MCV: 90.8 fL (ref 80.0–100.0)
Platelets: 516 10*3/uL — ABNORMAL HIGH (ref 150–400)
RBC: 3.8 MIL/uL — ABNORMAL LOW (ref 3.87–5.11)
RDW: 14.7 % (ref 11.5–15.5)
WBC: 18.9 10*3/uL — ABNORMAL HIGH (ref 4.0–10.5)
nRBC: 0 % (ref 0.0–0.2)

## 2021-06-05 NOTE — Progress Notes (Signed)
?   06/04/21 2303  ?Assess: MEWS Score  ?Temp 100.3 ?F (37.9 ?C)  ?BP (!) 119/42  ?Pulse Rate (!) 124  ?ECG Heart Rate (!) 124  ?Resp (!) 25  ?SpO2 92 %  ?O2 Device Tracheostomy Collar  ?O2 Flow Rate (L/min) 8 L/min  ?FiO2 (%) 35 %  ?Assess: MEWS Score  ?MEWS Temp 0  ?MEWS Systolic 0  ?MEWS Pulse 2  ?MEWS RR 1  ?MEWS LOC 0  ?MEWS Score 3  ?MEWS Score Color Yellow  ?Assess: if the MEWS score is Yellow or Red  ?Were vital signs taken at a resting state? Yes  ?Focused Assessment No change from prior assessment  ?Early Detection of Sepsis Score *See Row Information* Low  ?MEWS guidelines implemented *See Row Information* Yes  ?Treat  ?MEWS Interventions Other (Comment)  ?Pain Scale PAINAD  ?Pain Score 0  ?Take Vital Signs  ?Increase Vital Sign Frequency  Yellow: Q 2hr X 2 then Q 4hr X 2, if remains yellow, continue Q 4hrs  ?Escalate  ?MEWS: Escalate Yellow: discuss with charge nurse/RN and consider discussing with provider and RRT  ?Notify: Charge Nurse/RN  ?Name of Charge Nurse/RN Notified [place], RN  ?Date Charge Nurse/RN Notified 06/05/21  ?Time Charge Nurse/RN Notified 2310  ?Document  ?Patient Outcome Other (Comment)  ?Progress note created (see row info) Yes  ? ? ?

## 2021-06-05 NOTE — Progress Notes (Signed)
? ?General Surgery Follow Up Note ? ?Subjective:  ?  ?Overnight Issues:  ? ?Objective:  ?Vital signs for last 24 hours: ?Temp:  [97.8 ?F (36.6 ?C)-100.5 ?F (38.1 ?C)] 98 ?F (36.7 ?C) (04/09 0445) ?Pulse Rate:  [106-132] 106 (04/09 0445) ?Resp:  [15-33] 19 (04/09 0445) ?BP: (116-151)/(42-66) 116/56 (04/09 0445) ?SpO2:  [92 %-100 %] 99 % (04/09 0445) ?FiO2 (%):  [35 %] 35 % (04/09 0445) ?Weight:  [86.7 kg] 86.7 kg (04/09 0403) ? ?Hemodynamic parameters for last 24 hours: ?  ? ?Intake/Output from previous day: ?04/08 0701 - 04/09 0700 ?In: 200 [NG/GT:150; IV Piggyback:50] ?Out: 1460 [Urine:1460]  ?Intake/Output this shift: ?No intake/output data recorded. ? ?Vent settings for last 24 hours: ?FiO2 (%):  [35 %] 35 % ? ?Physical Exam:  ?Gen: comfortable, no distress ?Neuro: non-focal exam, f/c, interactive ?HEENT: PERRL ?Neck: supple ?CV: RRR ?Pulm: unlabored breathing ?Abd: soft, NT ?GU: clear yellow urine ?Extr: wwp, no edema ? ? ?Results for orders placed or performed during the hospital encounter of 05/05/21 (from the past 24 hour(s))  ?Urinalysis, Routine w reflex microscopic Urine, In & Out Cath     Status: Abnormal  ? Collection Time: 06/04/21  8:44 AM  ?Result Value Ref Range  ? Color, Urine YELLOW YELLOW  ? APPearance HAZY (A) CLEAR  ? Specific Gravity, Urine 1.019 1.005 - 1.030  ? pH 5.0 5.0 - 8.0  ? Glucose, UA NEGATIVE NEGATIVE mg/dL  ? Hgb urine dipstick NEGATIVE NEGATIVE  ? Bilirubin Urine NEGATIVE NEGATIVE  ? Ketones, ur NEGATIVE NEGATIVE mg/dL  ? Protein, ur NEGATIVE NEGATIVE mg/dL  ? Nitrite NEGATIVE NEGATIVE  ? Leukocytes,Ua NEGATIVE NEGATIVE  ?Glucose, capillary     Status: Abnormal  ? Collection Time: 06/04/21  9:40 AM  ?Result Value Ref Range  ? Glucose-Capillary 132 (H) 70 - 99 mg/dL  ?Glucose, capillary     Status: Abnormal  ? Collection Time: 06/04/21 11:15 AM  ?Result Value Ref Range  ? Glucose-Capillary 117 (H) 70 - 99 mg/dL  ?Glucose, capillary     Status: Abnormal  ? Collection Time: 06/04/21   2:55 PM  ?Result Value Ref Range  ? Glucose-Capillary 134 (H) 70 - 99 mg/dL  ?Glucose, capillary     Status: Abnormal  ? Collection Time: 06/04/21  7:11 PM  ?Result Value Ref Range  ? Glucose-Capillary 106 (H) 70 - 99 mg/dL  ?Glucose, capillary     Status: Abnormal  ? Collection Time: 06/04/21 11:50 PM  ?Result Value Ref Range  ? Glucose-Capillary 108 (H) 70 - 99 mg/dL  ?Glucose, capillary     Status: Abnormal  ? Collection Time: 06/05/21  3:03 AM  ?Result Value Ref Range  ? Glucose-Capillary 112 (H) 70 - 99 mg/dL  ?CBC     Status: Abnormal  ? Collection Time: 06/05/21  5:19 AM  ?Result Value Ref Range  ? WBC 18.9 (H) 4.0 - 10.5 K/uL  ? RBC 3.80 (L) 3.87 - 5.11 MIL/uL  ? Hemoglobin 10.7 (L) 12.0 - 15.0 g/dL  ? HCT 34.5 (L) 36.0 - 46.0 %  ? MCV 90.8 80.0 - 100.0 fL  ? MCH 28.2 26.0 - 34.0 pg  ? MCHC 31.0 30.0 - 36.0 g/dL  ? RDW 14.7 11.5 - 15.5 %  ? Platelets 516 (H) 150 - 400 K/uL  ? nRBC 0.0 0.0 - 0.2 %  ?Basic metabolic panel     Status: Abnormal  ? Collection Time: 06/05/21  5:19 AM  ?Result Value Ref Range  ?  Sodium 140 135 - 145 mmol/L  ? Potassium 4.0 3.5 - 5.1 mmol/L  ? Chloride 99 98 - 111 mmol/L  ? CO2 29 22 - 32 mmol/L  ? Glucose, Bld 113 (H) 70 - 99 mg/dL  ? BUN 14 6 - 20 mg/dL  ? Creatinine, Ser 0.74 0.44 - 1.00 mg/dL  ? Calcium 8.9 8.9 - 10.3 mg/dL  ? GFR, Estimated >60 >60 mL/min  ? Anion gap 12 5 - 15  ? ? ?Assessment & Plan: ? ?Present on Admission: ? Neuroendocrine carcinoma ([place]) ? ? ? LOS: 31 days  ? ?Additional comments:I reviewed the patient's new clinical lab test results.   and I reviewed the patients new imaging test results.   ? ?Neuroendocrine tumor on colonoscopy - s/p exlap, R hemicolectomy, cholecystectomy 3/9. Well differentiated neuroendocrine tumor with 1/7 +nodes. D/w Dr. Marin Olp 3/15 will likely need adjuvant chemo. Staples out 3/24. Bottom 2cm of wound granulating ?Respiratory failure 2/2 volume overload/?PNA - CT chest 3/11 and 3/13 with no PE. Intubated 3/16, suspect PNA based on  CXR but negative resp cx and BAL, s/p 7d empiric merrem. Decompensation 3/26, likely will need trach when settings lower, d/w husband. Discussed this patient with pulmonology 3/29 AM, recommend continuing current care, no abx unless organism identified. Repeat pulm c/s 4/1, brovana, yupelri, singulair added. Trach 4/4. ?Volume overload - seems euvolemic, lasix BID, goal even to 500 neg/24h, transition to PO 4/8. Echo 3/28, essentially normal.  ?Altered mental status - CT head 3/13, 3/16/ 3/17 normal (h/o VP shunt). Changed cefepime to zosyn 3/13, total 7d course-end today, empiric coverage for PNA. Resp cx today. Ucx 3/13 negative. Bcx 3/15 NGTD. LP 3/16 NGTD. CT A/P 3/16 negative. 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 Neurology on board, changed to keppra, cont EEG without sz, encephalopathy much improved  ?Anxiety - on valium 10q6, 50TID seroquel today ?Diarrhea - imodium BID, lower to 93m, decr cholestyramine to BID ?vWD - humate P per Heme, end 3/12, stable ?Transaminitis - likely 2/2 TPN, now d/c'd ?Vomiting - seems post-tussive, restart TF at 1/2 goal for now ?Leukocytosis, fever - 18 this AM, prior infectious workup unrevealing, curbside ID consult 3/28 with no add'l recs, LE duplex negative 4/8, CXR 4/8 unchanged, 150 diflucan for vaginal yeast infection concerns given 4/7, UA 4/8 negative ?FEN - NPO, OGT, tol TF at goal, abd pain improved when binder is loose ?ABLA - stable ?DVT - SCDs, s/p DDVAP, LMWH, LE duplex, negative 4/8 ?Foley - d/c 4/5, replaced 4/8 for retention ?Dispo - 4NP, PT/OT/SLP, anticipate LTAC ? ? ?AJesusita Oka MD ?Trauma & General Surgery ?Please use [email or web address] to contact on call provider ? ?06/05/2021 ? ?*Care during the described time interval was provided by me. I have reviewed this patient's available data, including medical history, events of note, physical examination and test results as part of my evaluation. ? ?

## 2021-06-06 DIAGNOSIS — Z430 Encounter for attention to tracheostomy: Secondary | ICD-10-CM | POA: Diagnosis not present

## 2021-06-06 DIAGNOSIS — C7A8 Other malignant neuroendocrine tumors: Secondary | ICD-10-CM | POA: Diagnosis not present

## 2021-06-06 LAB — BASIC METABOLIC PANEL
Anion gap: 11 (ref 5–15)
BUN: 18 mg/dL (ref 6–20)
CO2: 33 mmol/L — ABNORMAL HIGH (ref 22–32)
Calcium: 8.7 mg/dL — ABNORMAL LOW (ref 8.9–10.3)
Chloride: 95 mmol/L — ABNORMAL LOW (ref 98–111)
Creatinine, Ser: 0.89 mg/dL (ref 0.44–1.00)
GFR, Estimated: >60 mL/min (ref >60)
Glucose, Bld: 140 mg/dL — ABNORMAL HIGH (ref 70–99)
Potassium: 3.3 mmol/L — ABNORMAL LOW (ref 3.5–5.1)
Sodium: 139 mmol/L (ref 135–145)

## 2021-06-06 LAB — CBC
HCT: 31.4 % — ABNORMAL LOW (ref 36.0–46.0)
Hemoglobin: 9.7 g/dL — ABNORMAL LOW (ref 12.0–15.0)
MCH: 27.7 pg (ref 26.0–34.0)
MCHC: 30.9 g/dL (ref 30.0–36.0)
MCV: 89.7 fL (ref 80.0–100.0)
Platelets: 526 10*3/uL — ABNORMAL HIGH (ref 150–400)
RBC: 3.5 MIL/uL — ABNORMAL LOW (ref 3.87–5.11)
RDW: 14.3 % (ref 11.5–15.5)
WBC: 15.7 10*3/uL — ABNORMAL HIGH (ref 4.0–10.5)
nRBC: 0 % (ref 0.0–0.2)

## 2021-06-06 LAB — GLUCOSE, CAPILLARY
Glucose-Capillary: 126 mg/dL — ABNORMAL HIGH (ref 70–99)
Glucose-Capillary: 137 mg/dL — ABNORMAL HIGH (ref 70–99)
Glucose-Capillary: 162 mg/dL — ABNORMAL HIGH (ref 70–99)
Glucose-Capillary: 146 mg/dL — ABNORMAL HIGH (ref 70–99)
Glucose-Capillary: 142 mg/dL — ABNORMAL HIGH (ref 70–99)
Glucose-Capillary: 143 mg/dL — ABNORMAL HIGH (ref 70–99)
Glucose-Capillary: 120 mg/dL — ABNORMAL HIGH (ref 70–99)

## 2021-06-06 MED ORDER — DIAZEPAM 2 MG PO TABS
8.0000 mg | ORAL_TABLET | Freq: Four times a day (QID) | ORAL | Status: DC
  Administered 2021-06-06 – 2021-06-09 (×11): 8 mg
  Filled 2021-06-06 (×11): qty 4

## 2021-06-06 MED ORDER — SODIUM CHLORIDE 0.9 % IN NEBU
3.0000 mL | INHALATION_SOLUTION | Freq: Three times a day (TID) | RESPIRATORY_TRACT | Status: DC | PRN
  Filled 2021-06-06: qty 3

## 2021-06-06 MED ORDER — GUAIFENESIN 100 MG/5ML PO LIQD
10.0000 mL | ORAL | Status: DC
  Administered 2021-06-06 – 2021-06-15 (×50): 10 mL
  Filled 2021-06-06 (×50): qty 10

## 2021-06-06 MED ORDER — GUAIFENESIN 100 MG/5ML PO LIQD
5.0000 mL | ORAL | Status: DC | PRN
  Administered 2021-06-06: 5 mL
  Filled 2021-06-06: qty 10

## 2021-06-06 NOTE — Progress Notes (Signed)
? ?Trauma/Critical Care Follow Up Note ? ?Subjective:  ?  ?Overnight Issues:  ? ?Objective:  ?Vital signs for last 24 hours: ?Temp:  [97.7 ?F (36.5 ?C)-98.7 ?F (37.1 ?C)] 98.7 ?F (37.1 ?C) (04/10 1605) ?Pulse Rate:  [91-114] 108 (04/10 1605) ?Resp:  [15-31] 24 (04/10 1605) ?BP: (121-139)/(58-72) 132/64 (04/10 1456) ?SpO2:  [91 %-100 %] 92 % (04/10 1605) ?FiO2 (%):  [35 %-40 %] 35 % (04/10 1605) ?Weight:  [83.4 kg-85.1 kg] 83.4 kg (04/10 0500) ? ?Hemodynamic parameters for last 24 hours: ?  ? ?Intake/Output from previous day: ?04/09 0701 - 04/10 0700 ?In: 13.2 [IV Piggyback:67.2] ?Out: 1900 [Urine:1900]  ?Intake/Output this shift: ?Total I/O ?In: 360.5 [NG/GT:360.5] ?Out: 800 [Urine:800] ? ?Vent settings for last 24 hours: ?FiO2 (%):  [35 %-40 %] 35 % ? ?Physical Exam:  ?Gen: comfortable, no distress ?Neuro: non-focal exam ?HEENT: PERRL ?Neck: supple ?CV: RRR ?Pulm: unlabored breathing ?Abd: soft, NT, PEG ?GU: clear yellow urine ?Extr: wwp, no edema ? ? ? ?Results for orders placed or performed during the hospital encounter of 05/05/21 (from the past 24 hour(s))  ?Glucose, capillary     Status: Abnormal  ? Collection Time: 06/05/21  7:33 PM  ?Result Value Ref Range  ? Glucose-Capillary 104 (H) 70 - 99 mg/dL  ?Glucose, capillary     Status: Abnormal  ? Collection Time: 06/05/21 11:16 PM  ?Result Value Ref Range  ? Glucose-Capillary 114 (H) 70 - 99 mg/dL  ?CBC     Status: Abnormal  ? Collection Time: 06/06/21  3:40 AM  ?Result Value Ref Range  ? WBC 15.7 (H) 4.0 - 10.5 K/uL  ? RBC 3.50 (L) 3.87 - 5.11 MIL/uL  ? Hemoglobin 9.7 (L) 12.0 - 15.0 g/dL  ? HCT 31.4 (L) 36.0 - 46.0 %  ? MCV 89.7 80.0 - 100.0 fL  ? MCH 27.7 26.0 - 34.0 pg  ? MCHC 30.9 30.0 - 36.0 g/dL  ? RDW 14.3 11.5 - 15.5 %  ? Platelets 526 (H) 150 - 400 K/uL  ? nRBC 0.0 0.0 - 0.2 %  ?Basic metabolic panel     Status: Abnormal  ? Collection Time: 06/06/21  3:40 AM  ?Result Value Ref Range  ? Sodium 139 135 - 145 mmol/L  ? Potassium 3.3 (L) 3.5 - 5.1 mmol/L   ? Chloride 95 (L) 98 - 111 mmol/L  ? CO2 33 (H) 22 - 32 mmol/L  ? Glucose, Bld 140 (H) 70 - 99 mg/dL  ? BUN 18 6 - 20 mg/dL  ? Creatinine, Ser 0.89 0.44 - 1.00 mg/dL  ? Calcium 8.7 (L) 8.9 - 10.3 mg/dL  ? GFR, Estimated >60 >60 mL/min  ? Anion gap 11 5 - 15  ?Glucose, capillary     Status: Abnormal  ? Collection Time: 06/06/21  3:44 AM  ?Result Value Ref Range  ? Glucose-Capillary 126 (H) 70 - 99 mg/dL  ?Glucose, capillary     Status: Abnormal  ? Collection Time: 06/06/21  7:24 AM  ?Result Value Ref Range  ? Glucose-Capillary 137 (H) 70 - 99 mg/dL  ?Glucose, capillary     Status: Abnormal  ? Collection Time: 06/06/21 12:05 PM  ?Result Value Ref Range  ? Glucose-Capillary 162 (H) 70 - 99 mg/dL  ?Glucose, capillary     Status: Abnormal  ? Collection Time: 06/06/21  3:56 PM  ?Result Value Ref Range  ? Glucose-Capillary 146 (H) 70 - 99 mg/dL  ? ? ?Assessment & Plan: ? ?Present on Admission: ?  Neuroendocrine carcinoma ([place]) ? ? ? LOS: 32 days  ? ?Additional comments:I reviewed the patient's new clinical lab test results.   and I reviewed the patients new imaging test results.   ? ?Neuroendocrine tumor on colonoscopy - s/p exlap, R hemicolectomy, cholecystectomy 3/9. Well differentiated neuroendocrine tumor with 1/7 +nodes. D/w Dr. Marin Olp 3/15 will likely need adjuvant chemo. Staples out 3/24. Bottom 2cm of wound granulating ?Respiratory failure 2/2 volume overload/?PNA - CT chest 3/11 and 3/13 with no PE. Intubated 3/16, suspect PNA based on CXR but negative resp cx and BAL, s/p 7d empiric merrem. Trached, on TC. Repeat pulm c/s 4/1, brovana, yupelri, singulair added. Trach 4/4. ?Volume overload - seems euvolemic, lasix BID, goal even to 500 neg/24h, transition to PO 4/8. Echo 3/28, essentially normal.  ?Altered mental status - CT head 3/13, 3/16/ 3/17 normal (h/o VP shunt). Empiric cefepime for aspiration, 5d course, end 4/12. Resp cx today. Ucx 3/13 negative. Bcx 3/15 NGTD. LP 3/16 NGTD. CT A/P 3/16 negative. 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 Neurology on board, changed to keppra, cont EEG without sz, encephalopathy resolved ?Anxiety - on valium 10q6 decr to 8q6, 50TID seroquel ?Diarrhea - d/c imodium ?vWD - humate P per Heme, end 3/12, stable ?Transaminitis - likely 2/2 TPN, now d/c'd ?Vomiting - seems post-tussive, improved, change to CL trach, restart TF at 1/2 goal for now ?Leukocytosis, fever - 16 this AM, prior infectious workup unrevealing, curbside ID consult 3/28 with no add'l recs, LE duplex negative 4/8, CXR 4/8 unchanged, 150 diflucan for vaginal yeast infection concerns given 4/7, UA 4/8 negative ?FEN - NPO, OGT, tol TF at 30, incr to 45, abd pain improved when binder is loose ?ABLA - stable ?DVT - SCDs, s/p DDVAP, LMWH, LE duplex, negative 4/8 ?Foley - d/c 4/5, replaced 4/8 for retention ?Dispo - 4NP, PT/OT/SLP, anticipate LTAC ? ? ?[name], MD ?Trauma & General Surgery ?Please use [email or web address] to contact on call provider ? ?06/06/2021 ? ?*Care during the described time interval was provided by me. I have reviewed this patient's available data, including medical history, events of note, physical examination and test results as part of my evaluation. ? ? ? ?

## 2021-06-06 NOTE — Progress Notes (Addendum)
Speech Language Pathology Treatment: [name] Speaking valve  ?Patient Details ?Name: Cassidy Tran ?MRN: [id number] ?DOB: Jul 17, 1975 ?Today's Date: 06/06/2021 ?Time: 7867-5449 ?SLP Time Calculation (min) (ACUTE ONLY): 10 min ? ?Assessment / Plan / Recommendation ?Clinical Impression ? Pt was seen for PMSV treatment with her father present. Pt was alert, but reported that she was tired and felt the need to vomit, but this did not occur during the session. Pt's cuff was at least partially inflated with upon SLP's arrival. Vitals were RR 21, SpO2 100, and HR 106 at baseline and RR 26, SpO2 1000, HR 109 following complete cuff deflation. Pt tolerated PMSV for 29 minutes total including the time of the swallow evaluation. It is noteworthy that pt's O2 was reduced by RT while PMSV was on. Vitals range was RR 17-24, SpO2 96-100, HR 110-111 during this period and pt denied any respiratory difficulty. Pt's SpO2 reduced to 88% once during a coughing episode during p.o. trials, but she quickly recovered to 96%. Pt was able to expectorate secretions orally and via trach. Vocal quality was breathy, with minimal voicing. Pt improved vocal intensity slightly with cues to improve respiratory support. Pt's cuff was left deflated at the end of the session with RR <30 and SpO2 90-91%; Varney Biles, RN was advised of this and agreed to continue to monitor pt. SLP documented outside of pt's room and her SpO2 improved to 93-94% within 15 minutes of her completing the session. PMSV may be used intermittently with staff given full supervision. SLP will continue to follow pt.  ?  ?HPI HPI: Pt is a 46 y.o. female presents to [facility] hospital on 05/05/2021 and underwent ex-lap with cholecystectomy and R hemicolectomy for neuroendocrine tumor. Pt developed respiratory failure due to volume overload and required intubation. Pt developed AMS; underwent lumbar punture 3/16 which was negative.  ETT 3/16-trach 4/3. Cortrak 3/15, G-tube 4/3. PMH: ASCUS,  clotting disorder, seizures, PNA, Von Willebrand disease, VP shunt placement. ?  ?   ?SLP Plan ? Continue with current plan of care ? ?  ?  ?Recommendations for follow up therapy are one component of a multi-disciplinary discharge planning process, led by the attending physician.  Recommendations may be updated based on patient status, additional functional criteria and insurance authorization. ?  ? ?Recommendations  ?   ?   ? Patient may use Passy-Muir Speech Valve: Intermittently with supervision ?PMSV Supervision: Full ?MD: Please consider changing trach tube to : Cuffless  ?   ? ? ? ? Oral Care Recommendations: Oral care BID ?Follow Up Recommendations: Acute inpatient rehab (3hours/day) ?Assistance recommended at discharge: Frequent or constant Supervision/Assistance ?SLP Visit Diagnosis: Aphonia (R49.1) ?Plan: Continue with current plan of care ? ? ? ? ?  ?  ?Yatzari Jonsson I. Hardin Negus, [place], CCC-SLP ?Acute Rehabilitation Services ?Office number [phone] ?Pager [phone] ? ? ?Horton Marshall ? ?06/06/2021, 12:33 PM ? ? ? ? ?

## 2021-06-06 NOTE — Progress Notes (Signed)
Occupational Therapy Treatment ?Patient Details ?Name: Cassidy Tran ?MRN: [id number] ?DOB: May 01, 1975 ?Today's Date: 06/06/2021 ? ? ?History of present illness 46 y.o. female presents to [facility] hospital on 05/05/2021 and underwent ex-lap with cholecystectomy and R hemicolectomy for neuroendocrine tumor. Pt developed respiratory failure due to volume overload and required intubation on 3/16. Pt developed AMS during admission with rising white cell counts, underwent lumbar punture on 3/16. Pt underwent bronchoscopy on 3/21. Bronchoscopy 4/1, tracheostomy 4/3. PMH includes ASCUS, clotting disorder, seizures, PNA, Von Willebrand disease, VP shunt placement. ?  ?OT comments ? Cassidy Tran is making great progress towards her goals, session completed in conjunction with PT to address safe transfers and line management. Pt on 35% fiO2 8L trach collar with VSS throughout. Overall pt needed min A for bed mobility, mod A+2 for sit<>stand x2, and mod A +2 with RW to take pivotal steps from the bed>chair. Throughout she required encouragement, and cues for safety and hand placement with limited carryover noted throughout. Pt required an extended sitting rest break after the first stand and was completely exhausted after the SP transfer. Pt remains eager to participate and work towards increasing indep with ADLs and functional mobility, AIR remains appropriate. OT to continue to follow acutely.  ? ?Recommendations for follow up therapy are one component of a multi-disciplinary discharge planning process, led by the attending physician.  Recommendations may be updated based on patient status, additional functional criteria and insurance authorization. ?   ?Follow Up Recommendations ? Acute inpatient rehab (3hours/day)  ?  ?Assistance Recommended at Discharge Frequent or constant Supervision/Assistance  ?Patient can return home with the following ? Two people to help with walking and/or transfers;A lot of help with  bathing/dressing/bathroom;Assistance with cooking/housework;Assistance with feeding;Direct supervision/assist for medications management;Help with stairs or ramp for entrance;Assist for transportation;Direct supervision/assist for financial management ?  ?Equipment Recommendations ? Wheelchair cushion (measurements OT);Wheelchair (measurements OT);BSC/3in1;Tub/shower seat  ?  ?Recommendations for Other Services Rehab consult ? ?  ?Precautions / Restrictions Precautions ?Precautions: Fall ?Precaution Comments: trach collar, watch HR ?Restrictions ?Weight Bearing Restrictions: No  ? ? ?  ? ?Mobility Bed Mobility ?Overal bed mobility: Needs Assistance ?Bed Mobility: Rolling, Sidelying to Sit ?Rolling: Min assist ?Sidelying to sit: Min assist, HOB elevated ?  ?  ?  ?General bed mobility comments: increased time and encouragement.  Assist to bring legs off, advance hips and for trunk but much less assist than last visit. ?  ? ?Transfers ?Overall transfer level: Needs assistance ?Equipment used: Rolling walker (2 wheels) ?Transfers: Bed to chair/wheelchair/BSC, Sit to/from Stand ?Sit to Stand: Mod assist, +2 physical assistance, From elevated surface ?  ?  ?Step pivot transfers: Mod assist, +2 physical assistance ?  ?  ?General transfer comment: Pt was able to stand to RW with mod assist of 2 with use of pad or belt to initiate standing. Pt needed cues for hand placement as she tends to want to pull up on RW. Once up, pt needs cues and assist to maintain standing as she is weak and has trouble sustaining upright posture flexing at trunk. Needed to sit after first stand attempt.  REsted at least 3 min at EOB prior to standing and pivoting to the chair. Pt moved the left LE to take pivotal steps but had difficulty moving right LE due to foot drop and weakness right LE.  Pt did sit prematurely with PT/OT helping with making sure her buttocks landed in good position in chair. Total assist to scoot pt back as  pt was exhausted  after standing. ?  ?  ?Balance Overall balance assessment: Needs assistance ?Sitting-balance support: Single extremity supported, Bilateral upper extremity supported, Feet supported ?Sitting balance-Leahy Scale: Fair ?Sitting balance - Comments: minG-minA at edge of bed ?  ?Standing balance support: Bilateral upper extremity supported ?Standing balance-Leahy Scale: Poor ?Standing balance comment: relies on UE support. Pt needs min to mod assist of 2 for static stance.  Mod assist of 2 for dynamic activity. ?  ?  ?  ?  ?  ?  ?  ?  ?  ?  ?  ?   ? ?ADL either performed or assessed with clinical judgement  ? ?ADL Overall ADL's : Needs assistance/impaired ?  ?  ?  ?  ?  ?  ?  ?  ?  ?  ?  ?  ?  ?  ?  ?  ?  ?  ?Functional mobility during ADLs: Moderate assistance;+2 for safety/equipment;+2 for physical assistance ?General ADL Comments: session focused on bed mobility, transfers and activity tolerance as a precursor to ADLs ?  ? ?Extremity/Trunk Assessment Upper Extremity Assessment ?Upper Extremity Assessment: Generalized weakness ?RUE Deficits / Details: good over head shoulder flexion, globally 3+/5. weak grip strength but able to use R hand functioanlly for grooming ?RUE Sensation: WNL ?RUE Coordination: decreased fine motor;decreased gross motor ?LUE Deficits / Details: limited to ~90* shoulder flexion, question effort. weak grip strength. required incrased effort for management of RW with LUE ?LUE Sensation: WNL ?LUE Coordination: decreased fine motor;decreased gross motor ?  ?Lower Extremity Assessment ?Lower Extremity Assessment: Defer to PT evaluation ?  ?  ?  ? ?Vision   ?Vision Assessment?: No apparent visual deficits ?  ?Perception Perception ?Perception: Within Functional Limits ?  ?Praxis Praxis ?Praxis: Intact ?  ? ?Cognition Arousal/Alertness: Awake/alert ?Behavior During Therapy: Flat affect ?Overall Cognitive Status: Difficult to assess ?Area of Impairment: Problem solving ?  ?  ?  ?  ?  ?  ?  ?  ?  ?  ?   ?  ?  ?  ?Problem Solving: Slow processing, Decreased initiation, Requires verbal cues, Requires tactile cues ?General Comments: answering appropriately with head nods, following commands - attempting to communicate but becoming frustrated, denied paper/pencil ?  ?  ?   ?   ?   ?General Comments 35% fiO2 8L with VSS  ? ? ?Pertinent Vitals/ Pain       Pain Assessment ?Pain Assessment: Faces ?Faces Pain Scale: Hurts little more ?Pain Location: abdomen ?Pain Descriptors / Indicators: Grimacing ?Pain Intervention(s): Limited activity within patient's tolerance, Monitored during session ? ? ?Frequency ? Min 2X/week  ? ? ? ? ?  ?Progress Toward Goals ? ?OT Goals(current goals can now be found in the care plan section) ? Progress towards OT goals: Progressing toward goals ? ?Acute Rehab OT Goals ?Patient Stated Goal: to walk ?OT Goal Formulation: With patient ?Time For Goal Achievement: 06/16/21 ?Potential to Achieve Goals: Good ?ADL Goals ?Pt Will Perform Grooming: with supervision;sitting ?Pt Will Perform Upper Body Dressing: with supervision;sitting ?Pt Will Perform Lower Body Dressing: with min assist;sitting/lateral leans ?Pt Will Transfer to Toilet: with min assist;stand pivot transfer;bedside commode ?Pt/caregiver will Perform Home Exercise Program: Increased strength;Both right and left upper extremity;With Supervision  ?Plan Discharge plan remains appropriate   ? ?   ?AM-PAC OT "6 Clicks" Daily Activity     ?Outcome Measure ? ? Help from another person eating meals?: Total ?Help from another person taking care  of personal grooming?: A Little ?Help from another person toileting, which includes using toliet, bedpan, or urinal?: A Lot ?Help from another person bathing (including washing, rinsing, drying)?: A Lot ?Help from another person to put on and taking off regular upper body clothing?: A Lot ?Help from another person to put on and taking off regular lower body clothing?: A Lot ?6 Click Score: 12 ? ?  ?End of  Session Equipment Utilized During Treatment: Oxygen ? ?OT Visit Diagnosis: Unsteadiness on feet (R26.81);Muscle weakness (generalized) (M62.81);Dizziness and giddiness (R42);Pain ?  ?Activity Tolerance Patient tolerated treat

## 2021-06-06 NOTE — Progress Notes (Addendum)
Physical Therapy Treatment ?Patient Details ?Name: Cassidy Tran ?MRN: [id number] ?DOB: [birth year] ?Today's Date: 06/06/2021 ? ? ?History of Present Illness 46 y.o. female presents to [facility] hospital on 05/05/2021 and underwent ex-lap with cholecystectomy and R hemicolectomy for neuroendocrine tumor. Pt developed respiratory failure due to volume overload and required intubation on 3/16. Pt developed AMS during admission with rising Curtiss Mahmood cell counts, underwent lumbar punture on 3/16. Pt underwent bronchoscopy on 3/21. Bronchoscopy 4/1, tracheostomy 4/3. PMH includes ASCUS, clotting disorder, seizures, PNA, Von Willebrand disease, VP shunt placement. ? ?  ?PT Comments  ? ? Pt admitted with above diagnosis. Pt was more alert today and able to take pivotal steps to recliner with use of RW with mod assist of 2. Pt with weakness and does lack endurance for activity. Spouse present and very attentive to pt. Updated D/C plan to AIR as pt is appropriate from PT standpoint.  Pt wants to go home and has supportive family. Will follow acutely.  Pt currently with functional limitations due to balance and endurance deficits. Pt will benefit from skilled PT to increase their independence and safety with mobility to allow discharge to the venue listed below.      ?Recommendations for follow up therapy are one component of a multi-disciplinary discharge planning process, led by the attending physician.  Recommendations may be updated based on patient status, additional functional criteria and insurance authorization. ? ?Follow Up Recommendations ? Acute inpatient rehab (3hours/day) ?  ?  ?Assistance Recommended at Discharge Frequent or constant Supervision/Assistance  ?Patient can return home with the following Two people to help with walking and/or transfers;Two people to help with bathing/dressing/bathroom;Assistance with cooking/housework;Assistance with feeding;Direct supervision/assist for medications management;Direct  supervision/assist for financial management;Assist for transportation;Help with stairs or ramp for entrance ?  ?Equipment Recommendations ? Wheelchair (measurements PT);Wheelchair cushion (measurements PT);Hospital bed;Other (comment)  ?  ?Recommendations for Other Services   ? ? ?  ?Precautions / Restrictions Precautions ?Precautions: Fall ?Precaution Comments: trach collar, watch HR ?Restrictions ?Weight Bearing Restrictions: No  ?  ? ?Mobility ? Bed Mobility ?Overal bed mobility: Needs Assistance ?Bed Mobility: Rolling, Sidelying to Sit ?Rolling: Min assist ?Sidelying to sit: HOB elevated, Min assist ?  ?  ?  ?General bed mobility comments: increased time and encouragement.  Assist to bring legs off, advance hips and for trunk but much less assist than last visit. ?  ? ?Transfers ?Overall transfer level: Needs assistance ?Equipment used: Rolling walker (2 wheels) ?Transfers: Bed to chair/wheelchair/BSC, Sit to/from Stand ?Sit to Stand: Mod assist, +2 physical assistance, From elevated surface ?  ?Step pivot transfers: Mod assist, +2 physical assistance ?  ?  ?  ?General transfer comment: Pt was able to stand to RW with mod assist of 2 with use of pad or belt to initiate standing. Pt needed cues for hand placement as she tends to want to pull up on RW. Once up, pt needs cues and assist to maintain standing as she is weak and has trouble sustaining upright posture flexing at trunk. Needed to sit after first stand attempt.  REsted at least 3 min at EOB prior to standing and pivoting to the chair. Pt moved the left LE to take pivotal steps but had difficulty moving right LE due to foot drop and weakness right LE.  Pt did sit prematurely with PT/OT helping with making sure her buttocks landed in good position in chair. Total assist to scoot pt back as pt was exhausted after standing. ?  ? ?Ambulation/Gait ?  ?  ?  ?  ?  ?  ?  ?  ? ? ?  Stairs ?  ?  ?  ?  ?  ? ? ?Wheelchair Mobility ?  ? ?Modified Rankin (Stroke Patients  Only) ?  ? ? ?  ?Balance Overall balance assessment: Needs assistance ?Sitting-balance support: Single extremity supported, Bilateral upper extremity supported, Feet supported ?Sitting balance-Leahy Scale: Fair ?Sitting balance - Comments: minG-minA at edge of bed ?  ?Standing balance support: Bilateral upper extremity supported ?Standing balance-Leahy Scale: Poor ?Standing balance comment: relies on UE support. Pt needs min to mod assist of 2 for static stance.  Mod assist of 2 for dynamic activity. ?  ?  ?  ?  ?  ?  ?  ?  ?  ?  ?  ?  ? ?  ?Cognition Arousal/Alertness: Awake/alert ?Behavior During Therapy: Flat affect ?  ?Area of Impairment: Problem solving ?  ?  ?  ?  ?  ?  ?  ?  ?  ?  ?  ?  ?  ?  ?Problem Solving: Slow processing, Decreased initiation, Requires verbal cues, Requires tactile cues ?General Comments: answering appropriately with head nods, following commands ?  ?  ? ?  ?Exercises General Exercises - Lower Extremity ?Ankle Circles/Pumps: AROM, Left, 5 reps, PROM, Right ?Long Arc Quad: AROM, Both, 10 reps, Seated ? ?  ?General Comments General comments (skin integrity, edema, etc.): Pt on 35% trach collar with VSS ?  ?  ? ?Pertinent Vitals/Pain Pain Assessment ?Pain Assessment: Faces ?Faces Pain Scale: Hurts little more ?Breathing: normal ?Negative Vocalization: none ?Facial Expression: smiling or inexpressive ?Body Language: relaxed ?Consolability: no need to console ?PAINAD Score: 0 ?Pain Location: abdomen ?Pain Descriptors / Indicators: Grimacing ?Pain Intervention(s): Limited activity within patient's tolerance, Monitored during session, Repositioned  ? ? ?Home Living   ?  ?  ?  ?  ?  ?  ?  ?  ?  ?   ?  ?Prior Function    ?  ?  ?   ? ?PT Goals (current goals can now be found in the care plan section) Acute Rehab PT Goals ?Patient Stated Goal: to improve strength and reduce caregiver burden ?Progress towards PT goals: Progressing toward goals ? ?  ?Frequency ? ? ? Min 3X/week ? ? ? ?  ?PT Plan  Discharge plan needs to be updated  ? ? ?Co-evaluation  Yes - for mobility and safety ?  ?  ?  ?  ? ?  ?AM-PAC PT "6 Clicks" Mobility   ?Outcome Measure ? Help needed turning from your back to your side while in a flat bed without using bedrails?: A Little ?Help needed moving from lying on your back to sitting on the side of a flat bed without using bedrails?: A Little ?Help needed moving to and from a bed to a chair (including a wheelchair)?: Total ?Help needed standing up from a chair using your arms (e.g., wheelchair or bedside chair)?: Total ?Help needed to walk in hospital room?: Total ?Help needed climbing 3-5 steps with a railing? : Total ?6 Click Score: 10 ? ?  ?End of Session Equipment Utilized During Treatment: Oxygen;Gait belt ?Activity Tolerance: Patient limited by fatigue ?Patient left: with call bell/phone within reach;with family/visitor present;in chair;with chair alarm set ?Nurse Communication: Mobility status;Need for lift equipment (use Stedy) ?PT Visit Diagnosis: Muscle weakness (generalized) (M62.81);Pain;Other abnormalities of gait and mobility (R26.89) ?Pain - part of body:  (abdomen) ?  ? ? ?Time: 4098-1191 ?PT Time Calculation (min) (ACUTE ONLY): 34 min ? ?Charges:  $Therapeutic Exercise:  8-22 mins ?$Therapeutic Activity: 8-22 mins          ?          ? ?Ennis Heavner M,PT ?Acute Rehab Services ?[phone] ?[phone] (pager)  ? ? ?Tesha Archambeau F Maysun Meditz ?06/06/2021, 11:25 AM ? ?

## 2021-06-06 NOTE — Evaluation (Signed)
Clinical/Bedside Swallow Evaluation ?Patient Details  ?Name: Cassidy Tran ?MRN: [id number] ?Date of Birth: 12-22-1975 ? ?Today's Date: 06/06/2021 ?Time: SLP Start Time (ACUTE ONLY): 4627 SLP Stop Time (ACUTE ONLY): 1155 ?SLP Time Calculation (min) (ACUTE ONLY): 19 min ? ?Past Medical History:  ?Past Medical History:  ?Diagnosis Date  ? Acid reflux   ? Allergy   ? Anxiety   ? Arthritis   ? lower back  ? ASCUS (atypical squamous cells of undetermined significance) on Pap smear 03/2011  ? NEG HR HPV  ? Asthma   ? Cancer [facility])   ? skin cancer- age 9ish  ? Cervical dysplasia, mild 08/2010  ? LGSIL colposcopy biopsy showing koilocytotic atypia  ? Clotting disorder ([place])   ? Depression   ? Endometriosis   ? Headache(784.0)   ? High risk HPV infection 12/2011  ? Pap normal  ? Hypertension   ? Pneumonia   ? 2016ish  ? Seizures ([place])   ? seizure disorder  ? Smoker   ? Von Willebrand disease ([place])   ? ?Past Surgical History:  ?Past Surgical History:  ?Procedure Laterality Date  ? APPLICATION OF CRANIAL NAVIGATION N/A 11/24/2016  ? Procedure: APPLICATION OF CRANIAL NAVIGATION;  Surgeon: Kristeen Miss, MD;  Location: [place];  Service: Neurosurgery;  Laterality: N/A;  ? BRAIN SURGERY    ? BREAST BIOPSY Left   ? CHOLECYSTECTOMY N/A 05/05/2021  ? Procedure: CHOLECYSTECTOMY;  Surgeon: [name], MD;  Location: [place];  Service: General;  Laterality: N/A;  ? COLON RESECTION N/A 05/05/2021  ? Procedure: RIGHT HEMICOLECTOMY;  Surgeon: [name], MD;  Location: [place];  Service: General;  Laterality: N/A;  ? COLPOSCOPY    ? CYSTOSCOPY N/A 10/14/2012  ? Procedure: CYSTOSCOPY;  Surgeon: [name], MD;  Location: [facility];  Service: Gynecology;  Laterality: N/A;  ? DILATION AND CURETTAGE OF UTERUS    ? HYSTEROSCOPY WITH D & C  10/14/2010  ? Procedure: DILATATION AND CURETTAGE (D&C) /HYSTEROSCOPY;  Surgeon: [name], MD;  Location: [facility];  Service: Gynecology;  Laterality: N/A;  ? LAPAROSCOPIC HYSTERECTOMY N/A  10/14/2012  ? Procedure: HYSTERECTOMY TOTAL LAPAROSCOPIC;  Surgeon: [name], MD;  Location: [place] [facility];  Service: Gynecology;  Laterality: N/A;  CPT [phone] ? ?2 1/2 hours ? ?Dr. Uvaldo Rising to assist.  ? LAPAROSCOPY  04/27/2011  ? Procedure: LAPAROSCOPY OPERATIVE;  Surgeon: [name], MD;  Location: [facility];  Service: Gynecology;  Laterality: N/A;  removal right cyst , lysis of adhesions, biopsy of peritoneum  ? LAPAROTOMY N/A 05/05/2021  ? Procedure: EXPLORATORY LAPAROTOMY;  Surgeon: [name], MD;  Location: [place];  Service: General;  Laterality: N/A;  ? [place]  ? IN [place], [place]  ? PEG PLACEMENT N/A 05/30/2021  ? Procedure: PERCUTANEOUS ENDOSCOPIC GASTROSTOMY (PEG) PLACEMENT;  Surgeon: [name], MD;  Location: [place];  Service: General;  Laterality: N/A;  ? SUBOCCIPITAL CRANIECTOMY CERVICAL LAMINECTOMY N/A 11/06/2016  ? Procedure: Suboccipital decompression for chiari malformation;  Surgeon: Kristeen Miss, MD;  Location: [place];  Service: Neurosurgery;  Laterality: N/A;  ? TONSILLECTOMY    ? TRACHEOSTOMY TUBE PLACEMENT N/A 05/30/2021  ? Procedure: TRACHEOSTOMY;  Surgeon: [name], MD;  Location: [place];  Service: General;  Laterality: N/A;  ? VENTRICULOPERITONEAL SHUNT N/A 11/24/2016  ? Procedure: SHUNT INSERTION VENTRICULAR-PERITONEAL with Brainlab;  Surgeon: Kristeen Miss, MD;  Location: [place];  Service: Neurosurgery;  Laterality: N/A;  right side  approach  ? [place] EXTRACTION  1993  ? ?HPI:  ?Pt is a 46 y.o. female who presented to [facility] hospital on 05/05/2021 and underwent ex-lap with cholecystectomy and R hemicolectomy for neuroendocrine tumor. Pt developed respiratory failure due to volume overload and required intubation. Pt developed AMS; underwent lumbar punture 3/16 which was negative.  ETT 3/16-trach 4/3. Cortrak 3/15, G-tube 4/3. PMH: ASCUS, clotting disorder, seizures, PNA, Von Willebrand disease, VP shunt placement.  ?  ?Assessment / Plan /  Recommendation  ?Clinical Impression ? Pt was seen for bedside swallow evaluation and she denied a history of dysphagia PTA. PMSV was donned for the entirety of the evaluation. Oral mechanism exam was [facility] and she presented with adequate, natural dentition. White lingual coating noted and Varney Biles, RN advised of this. Pt demonstrated symptoms of pharyngeal dysphagia characterized by signs of aspiration with thin liquids. However, her swallow mechanism otherwise appeared functional with ice chips, nectar thick liquids via cup, and with purees. A modified barium swallow study is recommended to further assess swallow function. Pt stated that she is too tired to have it completed today. This recommendation was discussed with Dr. Bobbye Morton and it was agreed that the [facility] will be completed after pt's trach is changed to cuffless on 4/11. Initiation of a p.o. diet will be deferred until the MBS is completed, but pt may have ice chips after thorough oral care. ?SLP Visit Diagnosis: Dysphagia, unspecified (R13.10) ?   ?Aspiration Risk ? Mild aspiration risk;Moderate aspiration risk  ?  ?Diet Recommendation NPO;Ice chips PRN after oral care  ? ?Medication Administration: Via alternative means  ?  ?Other  Recommendations Oral Care Recommendations: Oral care BID   ? ?Recommendations for follow up therapy are one component of a multi-disciplinary discharge planning process, led by the attending physician.  Recommendations may be updated based on patient status, additional functional criteria and insurance authorization. ? ?Follow up Recommendations Acute inpatient rehab (3hours/day)  ? ? ?  ?Assistance Recommended at Discharge Frequent or constant Supervision/Assistance  ?Functional Status Assessment Patient has had a recent decline in their functional status and demonstrates the ability to make significant improvements in function in a reasonable and predictable amount of time.  ?Frequency and Duration min 2x/week  ?2 weeks ?  ?    ? ?Prognosis Prognosis for Safe Diet Advancement: Good  ? ?  ? ?Swallow Study   ?General Date of Onset: 05/30/21 ?HPI: Pt is a 46 y.o. female who presented to [facility] hospital on 05/05/2021 and underwent ex-lap with cholecystectomy and R hemicolectomy for neuroendocrine tumor. Pt developed respiratory failure due to volume overload and required intubation. Pt developed AMS; underwent lumbar punture 3/16 which was negative.  ETT 3/16-trach 4/3. Cortrak 3/15, G-tube 4/3. PMH: ASCUS, clotting disorder, seizures, PNA, Von Willebrand disease, VP shunt placement. ?Type of Study: Bedside Swallow Evaluation ?Previous Swallow Assessment: none ?Diet Prior to this Study: NPO;PEG tube ?Temperature Spikes Noted: No ?Respiratory Status: Trach Collar;Trach ?Trach Size and Type: #6;Cuff;Deflated;With PMSV in place ?History of Recent Intubation: Yes ?Length of Intubations (days): 16 days ?Date extubated: 05/30/21 (trach) ?Behavior/Cognition: Alert;Cooperative;Pleasant mood ?Oral Cavity Assessment: Within Functional Limits ?Oral Care Completed by SLP: Yes ?Oral Cavity - Dentition: Adequate natural dentition ?Vision: Functional for self-feeding ?Self-Feeding Abilities: Needs assist ?Patient Positioning: Upright in bed;Postural control adequate for testing ?Baseline Vocal Quality: Low vocal intensity;Breathy;Aphonic ?Volitional Cough: Strong;Congested ?Volitional Swallow: Able to elicit  ?  ?Oral/Motor/Sensory Function Overall Oral Motor/Sensory Function: Within functional limits   ?Ice Chips Ice chips:  Within functional limits ?Presentation: Spoon   ?Thin Liquid Thin Liquid: Impaired ?Presentation: Cup ?Pharyngeal  Phase Impairments: Cough - Immediate  ?  ?Nectar Thick Nectar Thick Liquid: Within functional limits ?Presentation: Cup   ?Honey Thick Honey Thick Liquid: Not tested   ?Puree Puree: Within functional limits ?Presentation: Spoon   ?Solid ? ? ?  Solid: Not tested  ? ?  ?Zacheriah Stumpe I. Hardin Negus, [place], CCC-SLP ?Acute Rehabilitation  Services ?Office number [phone] ?Pager [phone] ? ?Horton Marshall ?06/06/2021,1:22 PM ? ? ? ? ? ?

## 2021-06-06 NOTE — Progress Notes (Signed)
Nutrition Follow-up ? ?DOCUMENTATION CODES:  ? ?Obesity unspecified ? ?INTERVENTION:  ? ?- RD will monitor for diet advancement and PO intake and adjust tube feeding regimen as appropriate. Can consider transitioning to nocturnal tube feeds if pt taking in some POs; however, question pt's ability to tolerate higher rate of tube feeds given ongoing N/V on continuous feeds. ? ?Continue 24-hour tube feeds via PEG: ?- Vital 1.5 @ goal rate of 65 ml/hr (1560 ml/day) ?- ProSource TF 45 ml daily ? ?Tube feeding regimen provides 2380 kcal, 116 grams of protein, and 1192 ml of H2O.  ? ?- Magic Cup TID with meals, each supplement provides 290 kcal and 9 grams of protein ? ?NUTRITION DIAGNOSIS:  ? ?Increased nutrient needs related to acute illness as evidenced by estimated needs. ? ?Ongoing, being addressed via tube feeds, diet advancement, and supplements ? ?GOAL:  ? ?Patient will meet greater than or equal to 90% of their needs ? ?Met via TF ? ?MONITOR:  ? ?PO intake, Supplement acceptance, Diet advancement, Labs, Weight trends, TF tolerance, Skin ? ?REASON FOR ASSESSMENT:  ? ?Other (Cortrak) ?  ? ?ASSESSMENT:  ? 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 PMH significant for type IA von Willebrand disease. ? ?03/09 - s/p ex-lap, R hemicolectomy, cholecystectomy for well differentiated neuroendocrine turmor with 1/7 + nodes ?03/15 - s/p Cortrak placement (tip gastric), started TF, Cortrak removed later that night ?03/16 - intubated, OG tube placed  ?03/17 - developed emesis, TF held ?03/18 - TPN initiated ?03/20 - TF resumed and advanced to goal ?04/03 - s/p trach and PEG ?04/04 - TF held for nausea/abd pain ?04/05 - TF restarted at 1/2 goal rate ?04/06 - TF increased to goal ?04/08 - TF held due to vomiting episode ?04/09 - TF restarted at 1/2 goal rate ?04/11 - s/p MBS, diet advanced to full liquids ?04/12 - TF increased to goal by MD ? ?Pt no longer requiring vent support and is on ATC. Diet advanced  to full liquids and pt is able to take POs with PMV in place. Pt took a few sips of water this morning with SLP but otherwise has not consumed much of full liquid diet. Per SLP note, pt reporting nausea this morning with PO intake. ? ?Spoke with pt and husband at bedside. Full liquid lunch tray had just arrived and pt eager to try to [place]. Pt also requesting ice chips which RD provided. Pt requesting assistance with donning PMV. RN Ashleigh available to assist. Pt planning on trying some of the jello. RD to add Magic Cups to meal trays to maximize PO intake. ? ?Discussed pt with MD. Plan to continue 24-hour feeds at this time given limitations of full liquid diet and concern that pt will not take in enough POs. Can consider transitioning to nocturnal tube feeds but question pt's ability to tolerate a higher tube feeding rate/volume given multiple episodes of emesis thus far. ? ?Admit weight: 93.3 kg ?Current weight: 84.1 kg ? ?Pt with ~9 kg weight loss since admission. Suspect this is related to frequent holding of tube feeds due to emesis. After tube feeds have been held, they have been restarted at half rate and slowly advanced to goal. ? ?Current TF: Vital 1.5 @ 65 ml/hr, ProSource TF 45 ml daily ? ?Medications reviewed and include: lasix 80 mg BID, SSI q 4 hours, IV abx ? ?Labs reviewed: potassium 3.3, WBC 15.8, hemoglobin 8.9 ?CBG's: 111-155 x 24 hours ? ?UOP: 1100  ml x 24 hours ? ?Diet Order:   ?Diet Order   ? ?       ?  Diet full liquid Room service appropriate? Yes; Fluid consistency: Thin  Diet effective now       ?  ? ?  ?  ? ?  ? ? ?EDUCATION NEEDS:  ? ?Education needs have been addressed ? ?Skin:  Skin Assessment: ?Skin Integrity Issues: ?Stage I: L heel ?Incisions: abdomen (closed), neck ?Other: MASD anus ? ?Last BM:  06/08/21 small type 6 ? ?Height:  ? ?Ht Readings from Last 1 Encounters:  ?05/12/21 5' 3" (1.6 m)  ? ? ?Weight:  ? ?Wt Readings from Last 1 Encounters:  ?06/08/21 84.1 kg  ? ? ?Ideal  Body Weight:  52.3 kg ? ?BMI:  Body mass index is 32.84 kg/m?. ? ?Estimated Nutritional Needs:  ? ?Kcal:  2200-2400 ? ?Protein:  110-130 grams ? ?Fluid:  >/= 2.0 L ? ? ? ?Kate , MS, RD, LDN ?Inpatient Clinical Dietitian ?Please see AMiON for contact information. ? ?

## 2021-06-06 NOTE — Progress Notes (Signed)
? ?NAME:  Cassidy Tran, MRN:  [id number], DOB:  Mar 08, 1975, LOS: 26 ?ADMISSION DATE:  05/05/2021, CONSULTATION DATE:  05/28/2021 ?REFERRING MD:  Cassidy Tran - CCS CHIEF COMPLAINT:  Acute hypoxic respiratory failure ? ?History of Present Illness:  ?46 year old female smoker with neuroendocrine tumor of the terminal ileum who presented 3/9 for planned cholecystectomy with R hemicolectomy.  Postoperative course complicated by HCAP with hypoxia requiring intubation and AMS from elevated ammonia 2/2 seizure medications. ? ?Pertinent Medical History:  ?Asthma, Epilepsy, Chiari malformation s/p VPS, Epilepsy, Von Willebrand disease, Biliary dyskinesia, Neuroendocrine tumor ? ?Significant Hospital Events: ?Including procedures, antibiotic start and stop dates in addition to other pertinent events   ?3/09 ex lap, R hemicolectomy, chole ?3/10 oncology consulted ?3/11 respiratory distress, transfer to ICU, start cefepime ?3/13 concern for AMS due to hyperammonemia ?3/15 neurosurgery consult to determine if issue with VP shunt contributing to AMS ?3/16 intubation, neurology consulted, LP performed ?3/21 bronchoscopy ?3/26 ETT exchange ?4/01 bronchoscopy ?4/03 tracheostomy ?4/04 off vent ?407 transfer orders placed for PCU ?4/10 Tolerating ATC, up to chair, working with SLP using [place]. ? ?Studies:  ?CT angio chest 3/13 >> extensive GGO and interlobular septal thickening ?CT chest 3/27 >> stable multiple airspace opacities ?Echo 3/28 >> EF 60 to 65%, mod LVH ?Doppler legs b/l 3/31 >> no DVT ? ?Interim History / Subjective:  ?No significant event overnight ?Appears fatigued, as patient just transitioned back to bed from chair ?Tolerating ATC without need for vent support ?C/o irritation from trach sutures, can likely remove today ?May transition to cuffless trach this week ?Saline nebs/guaifenesin added for secretion management ? ?Objective:  ?Blood pressure 127/60, pulse (!) 105, temperature 98.4 ?F (36.9 ?C), temperature source Oral,  resp. rate 15, height '5\' 3"'$  (1.6 m), weight 83.4 kg, last menstrual period 09/16/2012, SpO2 93 %. ?   ?FiO2 (%):  [35 %-40 %] 35 %  ? ?Intake/Output Summary (Last 24 hours) at 06/06/2021 1059 ?Last data filed at 06/06/2021 0518 ?Gross per 24 hour  ?Intake 67.22 ml  ?Output 1900 ml  ?Net -1832.78 ml  ? ? ?Filed Weights  ? 06/05/21 0403 06/06/21 0417 06/06/21 0500  ?Weight: 86.7 kg 85.1 kg 83.4 kg  ? ?Physical Examination: ?General: Acutely ill-appearing middle-aged woman in NAD. Appears fatigued. ?HEENT: [place]/AT, anicteric sclera, PERRL, moist mucous membranes. Tracheostomy in place, sutured, with surrounding scant dried bloody secretions. ?Neuro: Awake, oriented x 4. Responds to verbal stimuli. Following commands consistently. Moves all 4 extremities spontaneously.  ?CV: RRR, no m/g/r. ?PULM: Breathing even and unlabored on ATC. Lung fields with scattered rhonchi in upper fields, clearing with cough. ?GI: Soft, nontender, nondistended. Normoactive bowel sounds. ?Extremities: No LE edema noted. ?Skin: Warm/dry, no rashes. ? ?Resolved Hospital Problem List:  ? ? ?Assessment & Plan:  ?Cassidy Tran is a 46 y.o. woman with history of severe asthma who presents with prolonged hospital stay secondary to neuroendocrine tumor of terminal ileum now s/p colectomy. PCCM following for prolonged respiratory failure and asthma.  ? ?Acute hypoxic respiratory failure 2nd to HCAP ?Hx of asthma, tobacco abuse ?Failure to wean from ventilator s/p tracheostomy on 4/03 ?- S/p completion of antibiotic course 3/27 ?- ATC as tolerated, no longer requiring vent support ?- Goal SpO2 90-95% ?- Continue Candiss Norse, Pulmicort, Singulair ?- Continue albuterol PRN ?- Saline nebs + Mucinex for secretion management ?- Appreciate SLP assistance; consider transition to cuffless trach week of 4/10 ? ?Patient's husband updated at bedside, trach sutures can likely be removed today and patient can be  transitioned to cuffless trach (will defer to  CCS). Will place office number in discharge instructions for outpatient pulmonary appointment once nearing discharge. ? ?PCCM will sign off at this time. Thank you for involving [place] in this patient's care. Please do not hesitate to reach out if we can be of further assistance. ? ?Best Practice: (right click and "Reselect all SmartList Selections" daily)  ? ?Per Primary ? ?Signature:  ? ?Cassidy Mount, PA-C ?[facility] ?06/06/21 10:59 AM ? ?Please see [email or web address] for pager details. ? ?From 7A-7P if no response, please call [phone] ?After hours, please call ELink [phone] ?

## 2021-06-06 NOTE — Discharge Instructions (Signed)
Please contact  Pulmonary ([phone] to schedule appointment for outpatient asthma follow up. ?

## 2021-06-07 ENCOUNTER — Inpatient Hospital Stay (HOSPITAL_COMMUNITY): Payer: No Typology Code available for payment source

## 2021-06-07 LAB — CBC
HCT: 29.6 % — ABNORMAL LOW (ref 36.0–46.0)
Hemoglobin: 8.9 g/dL — ABNORMAL LOW (ref 12.0–15.0)
MCH: 27.4 pg (ref 26.0–34.0)
MCHC: 30.1 g/dL (ref 30.0–36.0)
MCV: 91.1 fL (ref 80.0–100.0)
Platelets: 529 10*3/uL — ABNORMAL HIGH (ref 150–400)
RBC: 3.25 MIL/uL — ABNORMAL LOW (ref 3.87–5.11)
RDW: 13.9 % (ref 11.5–15.5)
WBC: 15.8 10*3/uL — ABNORMAL HIGH (ref 4.0–10.5)
nRBC: 0 % (ref 0.0–0.2)

## 2021-06-07 LAB — GLUCOSE, CAPILLARY
Glucose-Capillary: 154 mg/dL — ABNORMAL HIGH (ref 70–99)
Glucose-Capillary: 131 mg/dL — ABNORMAL HIGH (ref 70–99)
Glucose-Capillary: 155 mg/dL — ABNORMAL HIGH (ref 70–99)
Glucose-Capillary: 119 mg/dL — ABNORMAL HIGH (ref 70–99)
Glucose-Capillary: 120 mg/dL — ABNORMAL HIGH (ref 70–99)
Glucose-Capillary: 111 mg/dL — ABNORMAL HIGH (ref 70–99)

## 2021-06-07 LAB — BASIC METABOLIC PANEL
Anion gap: 13 (ref 5–15)
BUN: 15 mg/dL (ref 6–20)
CO2: 34 mmol/L — ABNORMAL HIGH (ref 22–32)
Calcium: 8.2 mg/dL — ABNORMAL LOW (ref 8.9–10.3)
Chloride: 93 mmol/L — ABNORMAL LOW (ref 98–111)
Creatinine, Ser: 0.79 mg/dL (ref 0.44–1.00)
GFR, Estimated: >60 mL/min (ref >60)
Glucose, Bld: 148 mg/dL — ABNORMAL HIGH (ref 70–99)
Potassium: 2.6 mmol/L — CL (ref 3.5–5.1)
Sodium: 140 mmol/L (ref 135–145)

## 2021-06-07 MED ORDER — POTASSIUM CHLORIDE 20 MEQ PO PACK
40.0000 meq | PACK | Freq: Once | ORAL | Status: AC
  Administered 2021-06-07: 40 meq
  Filled 2021-06-07: qty 2

## 2021-06-07 MED ORDER — POTASSIUM CHLORIDE 10 MEQ/50ML IV SOLN
10.0000 meq | INTRAVENOUS | Status: AC
  Administered 2021-06-07 (×4): 10 meq via INTRAVENOUS
  Filled 2021-06-07 (×4): qty 50

## 2021-06-07 NOTE — Progress Notes (Signed)
Speech Language Pathology Treatment: Cassidy Tran Speaking valve  ?Patient Details ?Name: Cassidy Tran ?MRN: [id number] ?DOB: 1975/12/19 ?Today's Date: 06/07/2021 ?Time: 1740-1750 ?SLP Time Calculation (min) (ACUTE ONLY): 10 min ? ?Assessment / Plan / Recommendation ?Clinical Impression ? Patient seen by SLP for skilled treatment session focused on educating patient's spouse regarding PMV donning and doffing. SLP discussed results of MBS and posted swallow safety sign in room. SLP educated patient and spouse on importance and reasoning for wearing PMV during PO consumption as well as sitting upright and making sure she is adequately alert. Spouse was attentive and verbalized understanding of PMV donning and doffing. SLP plans to f/u next date for diet toleration and upgrade solids trials if patient tolerates.  ? ?  ?HPI HPI: Pt is a 46 y.o. female who presented to [facility] hospital on 05/05/2021 and underwent ex-lap with cholecystectomy and R hemicolectomy for neuroendocrine tumor. Pt developed respiratory failure due to volume overload and required intubation. Pt developed AMS; underwent lumbar punture 3/16 which was negative.  ETT 3/16-trach 4/3. Cortrak 3/15, G-tube 4/3. PMH: ASCUS, clotting disorder, seizures, PNA, Von Willebrand disease, VP shunt placement. Trach changed to #6 cuffless on 4/11. ?  ?   ?SLP Plan ? Continue with current plan of care ? ?  ?  ?Recommendations for follow up therapy are one component of a multi-disciplinary discharge planning process, led by the attending physician.  Recommendations may be updated based on patient status, additional functional criteria and insurance authorization. ?  ? ?Recommendations  ?Diet recommendations: Thin liquid;Other(comment) (full liquids) ?Medication Administration: Via alternative means ?Supervision: Patient able to self feed;Full supervision/cueing for compensatory strategies ?Compensations: Slow rate;Small sips/bites ?Postural Changes and/or Swallow Maneuvers:  Seated upright 90 degrees  ?   ? PMSV Supervision: Full ?MD: Please consider changing trach tube to : Smaller size  ?   ? ? ? ? Oral Care Recommendations: Oral care BID ?Follow Up Recommendations: Acute inpatient rehab (3hours/day) ?Assistance recommended at discharge: Frequent or constant Supervision/Assistance ?SLP Visit Diagnosis: [place] (R49.1) ?Plan: Continue with current plan of care ? ? ? ? ?  ?  ? ?Sonia Baller, MA, CCC-SLP ?Speech Therapy ? ?

## 2021-06-07 NOTE — Progress Notes (Signed)
Date and time results received: 06/07/21 0540 ? ? ?Test: Potassium ?Critical Value: 2.6 ? ?Name of Provider Notified: Georganna Skeans, NP ? ?Orders Received? Or Actions Taken?:  ?No new orders at this time. ?

## 2021-06-07 NOTE — Progress Notes (Signed)
Modified Barium Swallow Progress Note ? ?Patient Details  ?Name: Cassidy Tran ?MRN: [id number] ?Date of Birth: Apr 03, 1975 ? ?Today's Date: 06/07/2021 ? ?Modified Barium Swallow completed.  Full report located under Chart Review in the Imaging Section. ? ?Brief recommendations include the following: ? ?Clinical Impression ? Patient presents with an oropharyngeal swallow that appears [facility] as per this MBS. Of note, patient reported that she has a "weak stomach" and was not sure she would tolerate the barium contrast. She only tolerated taking small sips of nectar thick, thin and one bite of puree texture. Oral phase appeared delayed however this is more likely from patient somewhat resistant. Swallow initiation was timely and patient exhibited full epiglottic deflection with boluses. No instances of penetration or aspiration and no pharyngeal residuals post swallows. Testing was completed with PMV on and off and although no significant difference observed, SLP is recommending that PMV be donned when patient eating/drinking. SLP is recommending to start with full liquids, thin consistency with plan to upgrade solids at bedside as patient tolerates. She herself did not report feeling hungry but did acknowledge she would like to drink some liquids. SLP will continue to follow for diet toleration and ability to advance. ?  ?Swallow Evaluation Recommendations ? ?   ? ? SLP Diet Recommendations: Thin liquid;Other (Comment) (full liquids) ? ? Liquid Administration via: Cup;Straw ? ? Medication Administration: Whole meds with puree ? ? Supervision: Patient able to self feed ? ? Compensations: Slow rate;Small sips/bites ? ?   ? ? Oral Care Recommendations: Oral care BID ? ?   ? ?Sonia Baller, MA, CCC-SLP ?Speech Therapy ? ?

## 2021-06-07 NOTE — Progress Notes (Signed)
This patient went down for a barium swallow. I was giving blood at the time but my charge nurse got her ready to go down with transport.  ?

## 2021-06-07 NOTE — Progress Notes (Signed)
Trach tube changed to #6 cuffless [place] Flex tube without difficulty. ?

## 2021-06-07 NOTE — Progress Notes (Signed)
Physical Therapy Treatment ?Patient Details ?Name: Cassidy Tran ?MRN: [id number] ?DOB: 1975-08-14 ?Today's Date: 06/07/2021 ? ? ?History of Present Illness 46 y.o. female presents to [facility] hospital on 05/05/2021 and underwent ex-lap with cholecystectomy and R hemicolectomy for neuroendocrine tumor. Pt developed respiratory failure due to volume overload and required intubation on 3/16. Pt developed AMS during admission with rising white cell counts, underwent lumbar punture on 3/16. Pt underwent bronchoscopy on 3/21. Bronchoscopy 4/1, tracheostomy 4/3. PMH includes ASCUS, clotting disorder, seizures, PNA, Von Willebrand disease, VP shunt placement. ? ?  ?PT Comments  ? ? Pt agreeable to OOB mobility today, tolerated stand and stepping over to recliner well with mod +2 assist. Pt limited by LE weakness, respiratory status with O2 needs increasing from 8 to 10LO2 during mobility. Pt with multiple productive coughs during session, pt's husband very involved in secretion management with suction. PT continuing to recommend AIR post-acutely.  ?  ?Recommendations for follow up therapy are one component of a multi-disciplinary discharge planning process, led by the attending physician.  Recommendations may be updated based on patient status, additional functional criteria and insurance authorization. ? ?Follow Up Recommendations ? Acute inpatient rehab (3hours/day) ?  ?  ?Assistance Recommended at Discharge Frequent or constant Supervision/Assistance  ?Patient can return home with the following Assistance with cooking/housework;Assistance with feeding;Direct supervision/assist for medications management;Direct supervision/assist for financial management;Assist for transportation;Help with stairs or ramp for entrance;A lot of help with walking and/or transfers;A lot of help with bathing/dressing/bathroom ?  ?Equipment Recommendations ? Wheelchair (measurements PT);Wheelchair cushion (measurements PT);Hospital bed;Other (comment)   ?  ?Recommendations for Other Services   ? ? ?  ?Precautions / Restrictions Precautions ?Precautions: Fall ?Precaution Comments: trach collar, watch HR ?Restrictions ?Weight Bearing Restrictions: No  ?  ? ?Mobility ? Bed Mobility ?Overal bed mobility: Needs Assistance ?Bed Mobility: Rolling, Sidelying to Sit ?Rolling: Min assist ?Sidelying to sit: Min assist, +2 for safety/equipment, HOB elevated ?  ?  ?  ?General bed mobility comments: assist for trunk translation, scooting to EOB with increased time. ?  ? ?Transfers ?Overall transfer level: Needs assistance ?Equipment used: 2 person hand held assist ?Transfers: Bed to chair/wheelchair/BSC, Sit to/from Stand ?Sit to Stand: Mod assist, +2 physical assistance ?  ?Step pivot transfers: Mod assist, +2 physical assistance ?  ?  ?  ?General transfer comment: mod +2 for power up, rise, steadying, steps towards L with max sequencing cues. SpO2 drop to 82% on 8LO2/35% FiO2 requiring increase to 10LO2/40%FiO2 ?  ? ?Ambulation/Gait ?  ?  ?  ?  ?  ?  ?  ?General Gait Details: nt ? ? ?Stairs ?  ?  ?  ?  ?  ? ? ?Wheelchair Mobility ?  ? ?Modified Rankin (Stroke Patients Only) ?  ? ? ?  ?Balance Overall balance assessment: Needs assistance ?Sitting-balance support: Single extremity supported, Bilateral upper extremity supported, Feet supported ?Sitting balance-Leahy Scale: Fair ?Sitting balance - Comments: minG-minA at edge of bed ?  ?Standing balance support: Bilateral upper extremity supported ?Standing balance-Leahy Scale: Poor ?Standing balance comment: relies on UE support. Pt needs min to mod assist of 2 for static stance.  Mod assist of 2 for dynamic activity. ?  ?  ?  ?  ?  ?  ?  ?  ?  ?  ?  ?  ? ?  ?Cognition Arousal/Alertness: Awake/alert ?Behavior During Therapy: Flat affect ?Overall Cognitive Status: Difficult to assess ?Area of Impairment: Problem solving, Attention, Following commands, Safety/judgement ?  ?  ?  ?  ?  ?  ?  ?  ?  ?  Current Attention Level:  Sustained ?  ?Following Commands: Follows one step commands with increased time ?Safety/Judgement: Decreased awareness of safety, Decreased awareness of deficits ?  ?Problem Solving: Slow processing, Decreased initiation, Requires verbal cues, Requires tactile cues ?General Comments: pt continues with flat affect, more responsive to husband and father who are present in room. ?  ?  ? ?  ?Exercises General Exercises - Lower Extremity ?Ankle Circles/Pumps: PROM, Right, 5 reps, Seated ? ?  ?General Comments General comments (skin integrity, edema, etc.): during transfer, SpO2 drop to 82% on 8LO2/35% FiO2 requiring increase to 10LO2/40%FiO2 ?  ?  ? ?Pertinent Vitals/Pain Pain Assessment ?Pain Assessment: Faces ?Faces Pain Scale: Hurts little more ?Pain Location: abdomen ?Pain Descriptors / Indicators: Grimacing ?Pain Intervention(s): Limited activity within patient's tolerance, Monitored during session, Repositioned  ? ? ?Home Living   ?  ?  ?  ?  ?  ?  ?  ?  ?  ?   ?  ?Prior Function    ?  ?  ?   ? ?PT Goals (current goals can now be found in the care plan section) Acute Rehab PT Goals ?Patient Stated Goal: to improve strength and reduce caregiver burden ?PT Goal Formulation: With patient/family ?Time For Goal Achievement: 06/15/21 ?Potential to Achieve Goals: Fair ?Progress towards PT goals: Progressing toward goals ? ?  ?Frequency ? ? ? Min 4X/week ? ? ? ?  ?PT Plan Current plan remains appropriate  ? ? ?Co-evaluation   ?  ?  ?  ?  ? ?  ?AM-PAC PT "6 Clicks" Mobility   ?Outcome Measure ? Help needed turning from your back to your side while in a flat bed without using bedrails?: A Little ?Help needed moving from lying on your back to sitting on the side of a flat bed without using bedrails?: A Little ?Help needed moving to and from a bed to a chair (including a wheelchair)?: A Lot ?Help needed standing up from a chair using your arms (e.g., wheelchair or bedside chair)?: A Lot ?Help needed to walk in hospital room?:  Total ?Help needed climbing 3-5 steps with a railing? : Total ?6 Click Score: 12 ? ?  ?End of Session Equipment Utilized During Treatment: Oxygen ?Activity Tolerance: Patient limited by fatigue ?Patient left: with call bell/phone within reach;with family/visitor present;in chair;Other (comment) (family to be with pt for duration of session) ?Nurse Communication: Mobility status (use Stedy) ?PT Visit Diagnosis: Muscle weakness (generalized) (M62.81);Pain;Other abnormalities of gait and mobility (R26.89) ?Pain - part of body:  (abdomen) ?  ? ? ?Time: [id number] ?PT Time Calculation (min) (ACUTE ONLY): 25 min ? ?Charges:  $Therapeutic Activity: 23-37 mins          ?          ? ?Stacie Glaze, PT DPT ?Acute Rehabilitation Services ?Pager [phone]  ?Office [phone] ? ? ? ?Kobi Aller E Stroup ?06/07/2021, 12:28 PM ? ?

## 2021-06-07 NOTE — Progress Notes (Signed)
? ?Trauma/Critical Care Follow Up Note ? ?Subjective:  ?  ?Overnight Issues:  ? ?Objective:  ?Vital signs for last 24 hours: ?Temp:  [98 ?F (36.7 ?C)-98.7 ?F (37.1 ?C)] 98.4 ?F (36.9 ?C) (04/11 1300) ?Pulse Rate:  [98-109] 102 (04/11 1412) ?Resp:  [19-26] 20 (04/11 1412) ?BP: (119-138)/(50-66) 131/50 (04/11 1300) ?SpO2:  [92 %-99 %] 96 % (04/11 1412) ?FiO2 (%):  [35 %] 35 % (04/11 1412) ?Weight:  [85.8 kg] 85.8 kg (04/11 0500) ? ?Hemodynamic parameters for last 24 hours: ?  ? ?Intake/Output from previous day: ?04/10 0701 - 04/11 0700 ?In: 880 [NG/GT:672.2; IV Piggyback:207.9] ?Out: 1550 [WCBJS:2831]  ?Intake/Output this shift: ?No intake/output data recorded. ? ?Vent settings for last 24 hours: ?FiO2 (%):  [35 %] 35 % ? ?Physical Exam:  ?Gen: comfortable, no distress ?Neuro: non-focal exam ?HEENT: PERRL ?Neck: supple, trached ?CV: RRR ?Pulm: unlabored breathing ?Abd: soft, NT, PEG ?GU: clear yellow urine ?Extr: wwp, no edema ? ? ?Results for orders placed or performed during the hospital encounter of 05/05/21 (from the past 24 hour(s))  ?Glucose, capillary     Status: Abnormal  ? Collection Time: 06/06/21  3:56 PM  ?Result Value Ref Range  ? Glucose-Capillary 146 (H) 70 - 99 mg/dL  ?Glucose, capillary     Status: Abnormal  ? Collection Time: 06/06/21  4:58 PM  ?Result Value Ref Range  ? Glucose-Capillary 142 (H) 70 - 99 mg/dL  ?Glucose, capillary     Status: Abnormal  ? Collection Time: 06/06/21  7:16 PM  ?Result Value Ref Range  ? Glucose-Capillary 143 (H) 70 - 99 mg/dL  ?Glucose, capillary     Status: Abnormal  ? Collection Time: 06/06/21 11:10 PM  ?Result Value Ref Range  ? Glucose-Capillary 120 (H) 70 - 99 mg/dL  ?Glucose, capillary     Status: Abnormal  ? Collection Time: 06/07/21  3:26 AM  ?Result Value Ref Range  ? Glucose-Capillary 154 (H) 70 - 99 mg/dL  ?CBC     Status: Abnormal  ? Collection Time: 06/07/21  5:18 AM  ?Result Value Ref Range  ? WBC 15.8 (H) 4.0 - 10.5 K/uL  ? RBC 3.25 (L) 3.87 - 5.11 MIL/uL   ? Hemoglobin 8.9 (L) 12.0 - 15.0 g/dL  ? HCT 29.6 (L) 36.0 - 46.0 %  ? MCV 91.1 80.0 - 100.0 fL  ? MCH 27.4 26.0 - 34.0 pg  ? MCHC 30.1 30.0 - 36.0 g/dL  ? RDW 13.9 11.5 - 15.5 %  ? Platelets 529 (H) 150 - 400 K/uL  ? nRBC 0.0 0.0 - 0.2 %  ?Basic metabolic panel     Status: Abnormal  ? Collection Time: 06/07/21  5:18 AM  ?Result Value Ref Range  ? Sodium 140 135 - 145 mmol/L  ? Potassium 2.6 (LL) 3.5 - 5.1 mmol/L  ? Chloride 93 (L) 98 - 111 mmol/L  ? CO2 34 (H) 22 - 32 mmol/L  ? Glucose, Bld 148 (H) 70 - 99 mg/dL  ? BUN 15 6 - 20 mg/dL  ? Creatinine, Ser 0.79 0.44 - 1.00 mg/dL  ? Calcium 8.2 (L) 8.9 - 10.3 mg/dL  ? GFR, Estimated >60 >60 mL/min  ? Anion gap 13 5 - 15  ?Glucose, capillary     Status: Abnormal  ? Collection Time: 06/07/21  7:54 AM  ?Result Value Ref Range  ? Glucose-Capillary 131 (H) 70 - 99 mg/dL  ?Glucose, capillary     Status: Abnormal  ? Collection Time: 06/07/21 10:58 AM  ?  Result Value Ref Range  ? Glucose-Capillary 155 (H) 70 - 99 mg/dL  ? ? ?Assessment & Plan: ? ?Present on Admission: ? Neuroendocrine carcinoma ([place]) ? ? ? LOS: 33 days  ? ?Additional comments:I reviewed the patient's new clinical lab test results.   and I reviewed the patients new imaging test results.   ? ?Neuroendocrine tumor on colonoscopy - s/p exlap, R hemicolectomy, cholecystectomy 3/9. Well differentiated neuroendocrine tumor with 1/7 +nodes. D/w Dr. Marin Olp 3/15 will likely need adjuvant chemo. Staples out 3/24. Bottom 2cm of wound granulating ?Respiratory failure 2/2 volume overload/?PNA - CT chest 3/11 and 3/13 with no PE. Intubated 3/16, suspect PNA based on CXR but negative resp cx and BAL, s/p 7d empiric merrem. Trached, on TC. Repeat pulm c/s 4/1, brovana, yupelri, singulair added. Trach 4/4. ?Volume overload - seems euvolemic, lasix BID, goal even to 500 neg/24h, transition to PO 4/8. Echo 3/28, essentially normal.  ?Altered mental status - CT head 3/13, 3/16/ 3/17 normal (h/o VP shunt). Empiric cefepime for  aspiration, 5d course, end 4/12. Resp cx today. Ucx 3/13 negative. Bcx 3/15 NGTD. LP 3/16 NGTD. CT A/P 3/16 negative. 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 Neurology on board, changed to keppra, cont EEG without sz, encephalopathy resolved ?Anxiety - on valium 10q6 decr to 8q6, 50TID seroquel ?Diarrhea - d/c imodium ?vWD - humate P per Heme, end 3/12, stable ?Transaminitis - likely 2/2 TPN, now d/c'd ?Vomiting - seems post-tussive, improved, change to CL trach, restart TF at 1/2 goal for now ?Leukocytosis, fever - 16 this AM, prior infectious workup unrevealing, curbside ID consult 3/28 with no add'l recs, LE duplex negative 4/8, CXR 4/8 unchanged, 150 diflucan for vaginal yeast infection concerns given 4/7, UA 4/8 negative ?FEN - NPO, OGT, tol TF at 30, incr to 45, abd pain improved when binder is loose ?ABLA - stable ?DVT - SCDs, s/p DDVAP, LMWH, LE duplex, negative 4/8 ?Foley - d/c 4/5, replaced 4/8 for retention ?Dispo - 4NP, PT/OT/SLP, anticipate LTAC ?  ? ?[name], MD ?Trauma & General Surgery ?Please use [email or web address] to contact on call provider ? ?06/07/2021 ? ?*Care during the described time interval was provided by me. I have reviewed this patient's available data, including medical history, events of note, physical examination and test results as part of my evaluation. ? ? ? ?

## 2021-06-08 LAB — GLUCOSE, CAPILLARY
Glucose-Capillary: 103 mg/dL — ABNORMAL HIGH (ref 70–99)
Glucose-Capillary: 118 mg/dL — ABNORMAL HIGH (ref 70–99)
Glucose-Capillary: 120 mg/dL — ABNORMAL HIGH (ref 70–99)
Glucose-Capillary: 120 mg/dL — ABNORMAL HIGH (ref 70–99)
Glucose-Capillary: 131 mg/dL — ABNORMAL HIGH (ref 70–99)
Glucose-Capillary: 150 mg/dL — ABNORMAL HIGH (ref 70–99)

## 2021-06-08 LAB — BASIC METABOLIC PANEL
Anion gap: 13 (ref 5–15)
BUN: 11 mg/dL (ref 6–20)
CO2: 32 mmol/L (ref 22–32)
Calcium: 8.1 mg/dL — ABNORMAL LOW (ref 8.9–10.3)
Chloride: 93 mmol/L — ABNORMAL LOW (ref 98–111)
Creatinine, Ser: 0.82 mg/dL (ref 0.44–1.00)
GFR, Estimated: >60 mL/min (ref >60)
Glucose, Bld: 137 mg/dL — ABNORMAL HIGH (ref 70–99)
Potassium: 3.3 mmol/L — ABNORMAL LOW (ref 3.5–5.1)
Sodium: 138 mmol/L (ref 135–145)

## 2021-06-08 LAB — MAGNESIUM: Magnesium: 2.1 mg/dL (ref 1.7–2.4)

## 2021-06-08 NOTE — Progress Notes (Signed)
Speech Language Pathology Treatment: Dysphagia  ?Patient Details ?Name: Cassidy Tran ?MRN: [id number] ?DOB: 02/14/1976 ?Today's Date: 06/08/2021 ?Time: 3244-0102 ?SLP Time Calculation (min) (ACUTE ONLY): 15 min ? ?Assessment / Plan / Recommendation ?Clinical Impression ? Patient seen for skilled SLP treatment session focused on dysphagia goals. Spouse and RN both in room and with RN reporting that MD had increased patient's tube feedings. Patient was awake and alert and agreeable to trying some water. After spouse assisted with repositioning in bed, SLP donned PMV. Patient consumed two straw sips of water which she appeared to tolerate without significant difficulty. She did have a mild cough which was suspected to be more due to secretion management than penetration/aspiration. SpO2 remained in low 90's% and RR did fluctuate some, lowest at 20-22 and highest at 33. Patient c/o being hot and then whispered that she was feeling nauseous. RN informed and patient was given Zofran. When SLP standing outside of door while patient receiving nursing care, heard her wretching a few times. SLP continues to recommend full liquids diet, PMV donned during PO intake and with full supervision intermittently with staff. SLP will continue to follow for PO toleration, PMV usage/toleration and ability to tolerate upgraded solids. ? ?  ?HPI HPI: Pt is a 46 y.o. female who presented to [facility] hospital on 05/05/2021 and underwent ex-lap with cholecystectomy and R hemicolectomy for neuroendocrine tumor. Pt developed respiratory failure due to volume overload and required intubation. Pt developed AMS; underwent lumbar punture 3/16 which was negative.  ETT 3/16-trach 4/3. Cortrak 3/15, G-tube 4/3. PMH: ASCUS, clotting disorder, seizures, PNA, Von Willebrand disease, VP shunt placement. Trach changed to #6 cuffless on 4/11. MBS on 4/11 with swallow function appearing WFL and without aspiration or penetration, full liquids (thin) recommended. MD  increased tube feeding amount on 4/12. ?  ?   ?SLP Plan ? Continue with current plan of care ? ?  ?  ?Recommendations for follow up therapy are one component of a multi-disciplinary discharge planning process, led by the attending physician.  Recommendations may be updated based on patient status, additional functional criteria and insurance authorization. ?  ? ?Recommendations  ?Diet recommendations: Thin liquid;Other(comment) (full liquids) ?Liquids provided via: Straw;Cup ?Medication Administration: Other (Comment) (via PEG or whole in puree if patient tolerates) ?Supervision: Patient able to self feed;Full supervision/cueing for compensatory strategies ?Compensations: Slow rate;Small sips/bites;Other (Comment) (PMV in place with PO's) ?Postural Changes and/or Swallow Maneuvers: Seated upright 90 degrees  ?   ? Patient may use Passy-Muir Speech Valve: Intermittently with supervision ?PMSV Supervision: Full ?MD: Please consider changing trach tube to : Smaller size  ?   ? ? ? ? Oral Care Recommendations: Oral care BID ?Follow Up Recommendations: Acute inpatient rehab (3hours/day) ?Assistance recommended at discharge: Frequent or constant Supervision/Assistance ?SLP Visit Diagnosis: Dysphagia, unspecified (R13.10) ?Plan: Continue with current plan of care ? ? ? ? ?  ?  ? ? ?Sonia Baller, MA, CCC-SLP ?Speech Therapy ? ?

## 2021-06-08 NOTE — Progress Notes (Signed)
Physical Therapy Treatment ?Patient Details ?Name: Cassidy Tran ?MRN: [id number] ?DOB: 01-Nov-1975 ?Today's Date: 06/08/2021 ? ? ?History of Present Illness 46 y.o. female presents to [facility] hospital on 05/05/2021 and underwent ex-lap with cholecystectomy and R hemicolectomy for neuroendocrine tumor. Pt developed respiratory failure due to volume overload and required intubation on 3/16. Pt developed AMS during admission with rising white cell counts, underwent lumbar punture on 3/16. Pt underwent bronchoscopy on 3/21. Bronchoscopy 4/1, tracheostomy 4/3. PMH includes ASCUS, clotting disorder, seizures, PNA, Von Willebrand disease, VP shunt placement. ? ?  ?PT Comments  ? ? Pt remains limited by fatigue but is able to perform bed mobility with reduced physical assistance. Pt shows improvement in strength and initiates transfers well, requiring UE support and physical assistance to maintain balance. Pt will benefit from continued frequent mobilization in an effort to continue progression of strength and activity tolerance.  PT continues to recommend AIR admission.  ?Recommendations for follow up therapy are one component of a multi-disciplinary discharge planning process, led by the attending physician.  Recommendations may be updated based on patient status, additional functional criteria and insurance authorization. ? ?Follow Up Recommendations ? Acute inpatient rehab (3hours/day) ?  ?  ?Assistance Recommended at Discharge Frequent or constant Supervision/Assistance  ?Patient can return home with the following Assistance with cooking/housework;Assistance with feeding;Direct supervision/assist for medications management;Direct supervision/assist for financial management;Assist for transportation;Help with stairs or ramp for entrance;A lot of help with walking and/or transfers;A lot of help with bathing/dressing/bathroom ?  ?Equipment Recommendations ? Wheelchair (measurements PT);Wheelchair cushion (measurements  PT);Hospital bed;Other (comment)  ?  ?Recommendations for Other Services   ? ? ?  ?Precautions / Restrictions Precautions ?Precautions: Fall ?Precaution Comments: trach collar, watch HR ?Restrictions ?Weight Bearing Restrictions: No  ?  ? ?Mobility ? Bed Mobility ?Overal bed mobility: Needs Assistance ?Bed Mobility: Rolling, Sidelying to Sit ?Rolling: Min guard ?Sidelying to sit: Min guard ?  ?  ?  ?General bed mobility comments: use of bed rails ?  ? ?Transfers ?Overall transfer level: Needs assistance ?Equipment used: 1 person hand held assist ?Transfers: Sit to/from Stand, Bed to chair/wheelchair/BSC ?Sit to Stand: Min assist ?  ?Step pivot transfers: Max assist ?  ?  ?  ?General transfer comment: pt utilizing BUE support of PT, pt with LE weakness and attempting to sit early in transfer sequence, needing PT assistance to guid back into recliner ?  ? ?Ambulation/Gait ?  ?  ?  ?  ?  ?  ?  ?  ? ? ?Stairs ?  ?  ?  ?  ?  ? ? ?Wheelchair Mobility ?  ? ?Modified Rankin (Stroke Patients Only) ?  ? ? ?  ?Balance Overall balance assessment: Needs assistance ?Sitting-balance support: No upper extremity supported, Feet supported ?Sitting balance-Leahy Scale: Fair ?  ?  ?Standing balance support: Bilateral upper extremity supported ?Standing balance-Leahy Scale: Poor ?Standing balance comment: min-modA ?  ?  ?  ?  ?  ?  ?  ?  ?  ?  ?  ?  ? ?  ?Cognition Arousal/Alertness: Awake/alert ?Behavior During Therapy: Flat affect ?Overall Cognitive Status: Impaired/Different from baseline ?Area of Impairment: Problem solving, Awareness ?  ?  ?  ?  ?  ?  ?  ?  ?  ?  ?  ?  ?Safety/Judgement: Decreased awareness of deficits ?Awareness: Emergent ?Problem Solving: Slow processing ?  ?  ?  ? ?  ?Exercises   ? ?  ?General Comments General comments (skin  integrity, edema, etc.): pt on 10 L trach collar, sats ranging from 94% at rest to 78% briefly after transfer. Sats recover in 2 minutes to high 89%. All other vital signs stable ?  ?   ? ?Pertinent Vitals/Pain Pain Assessment ?Pain Assessment: No/denies pain  ? ? ?Home Living   ?  ?  ?  ?  ?  ?  ?  ?  ?  ?   ?  ?Prior Function    ?  ?  ?   ? ?PT Goals (current goals can now be found in the care plan section) Acute Rehab PT Goals ?Patient Stated Goal: to improve strength and reduce caregiver burden ?Progress towards PT goals: Progressing toward goals ? ?  ?Frequency ? ? ? Min 4X/week ? ? ? ?  ?PT Plan Current plan remains appropriate  ? ? ?Co-evaluation   ?  ?  ?  ?  ? ?  ?AM-PAC PT "6 Clicks" Mobility   ?Outcome Measure ? Help needed turning from your back to your side while in a flat bed without using bedrails?: A Little ?Help needed moving from lying on your back to sitting on the side of a flat bed without using bedrails?: A Little ?Help needed moving to and from a bed to a chair (including a wheelchair)?: A Lot ?Help needed standing up from a chair using your arms (e.g., wheelchair or bedside chair)?: A Lot ?Help needed to walk in hospital room?: Total ?Help needed climbing 3-5 steps with a railing? : Total ?6 Click Score: 12 ? ?  ?End of Session Equipment Utilized During Treatment: Oxygen ?Activity Tolerance: Patient limited by fatigue ?Patient left: in chair;with call bell/phone within reach;with family/visitor present ?Nurse Communication: Mobility status ?PT Visit Diagnosis: Muscle weakness (generalized) (M62.81);Pain;Other abnormalities of gait and mobility (R26.89) ?  ? ? ?Time: 4259-5638 ?PT Time Calculation (min) (ACUTE ONLY): 19 min ? ?Charges:  $Therapeutic Activity: 8-22 mins          ?          ? ?Zenaida Niece, PT, DPT ?Acute Rehabilitation ?Pager: [phone] ?Office [phone] ? ? ? ?Zenaida Niece ?06/08/2021, 3:35 PM ? ?

## 2021-06-08 NOTE — Progress Notes (Signed)
? ?Trauma/Critical Care Follow Up Note ? ?Subjective:  ?  ?Overnight Issues:  ? ?Objective:  ?Vital signs for last 24 hours: ?Temp:  [98 ?F (36.7 ?C)-98.8 ?F (37.1 ?C)] 98.1 ?F (36.7 ?C) (04/12 0319) ?Pulse Rate:  [96-114] 96 (04/12 0338) ?Resp:  [20-29] 24 (04/12 0338) ?BP: (119-138)/(50-72) 127/61 (04/12 9518) ?SpO2:  [90 %-98 %] 96 % (04/12 0338) ?FiO2 (%):  [35 %] 35 % (04/12 0338) ?Weight:  [84.1 kg] 84.1 kg (04/12 0500) ? ?Hemodynamic parameters for last 24 hours: ?  ? ?Intake/Output from previous day: ?04/11 0701 - 04/12 0700 ?In: -  ?Out: 1100 [Urine:1100]  ?Intake/Output this shift: ?Total I/O ?In: -  ?Out: 650 [Urine:650] ? ?Vent settings for last 24 hours: ?FiO2 (%):  [35 %] 35 % ? ?Physical Exam:  ?Gen: comfortable, no distress ?Neuro: non-focal exam ?HEENT: PERRL ?Neck: supple, trached ?CV: RRR ?Pulm: unlabored breathing on TC ?Abd: soft, NT, PEG ?GU: clear yellow urine ?Extr: wwp, no edema ? ? ?Results for orders placed or performed during the hospital encounter of 05/05/21 (from the past 24 hour(s))  ?Glucose, capillary     Status: Abnormal  ? Collection Time: 06/07/21  7:54 AM  ?Result Value Ref Range  ? Glucose-Capillary 131 (H) 70 - 99 mg/dL  ?Glucose, capillary     Status: Abnormal  ? Collection Time: 06/07/21 10:58 AM  ?Result Value Ref Range  ? Glucose-Capillary 155 (H) 70 - 99 mg/dL  ?Glucose, capillary     Status: Abnormal  ? Collection Time: 06/07/21  3:09 PM  ?Result Value Ref Range  ? Glucose-Capillary 119 (H) 70 - 99 mg/dL  ?Glucose, capillary     Status: Abnormal  ? Collection Time: 06/07/21  7:58 PM  ?Result Value Ref Range  ? Glucose-Capillary 120 (H) 70 - 99 mg/dL  ?Glucose, capillary     Status: Abnormal  ? Collection Time: 06/07/21 11:02 PM  ?Result Value Ref Range  ? Glucose-Capillary 111 (H) 70 - 99 mg/dL  ?Glucose, capillary     Status: Abnormal  ? Collection Time: 06/08/21  3:18 AM  ?Result Value Ref Range  ? Glucose-Capillary 131 (H) 70 - 99 mg/dL  ? ? ?Assessment & Plan: ?The  plan of care was discussed with the bedside nurse for the night, who is in agreement with this plan and no additional concerns were raised.  ? ?Present on Admission: ? Neuroendocrine carcinoma ([place]) ? ? ? LOS: 34 days  ? ?Additional comments:I reviewed the patient's new clinical lab test results.   and I reviewed the patients new imaging test results.   ? ?Neuroendocrine tumor on colonoscopy - s/p exlap, R hemicolectomy, cholecystectomy 3/9. Well differentiated neuroendocrine tumor with 1/7 +nodes. D/w Dr. Marin Olp 3/15 will likely need adjuvant chemo. Staples out 3/24. Bottom 2cm of wound granulating ?Respiratory failure 2/2 volume overload/?PNA - CT chest 3/11 and 3/13 with no PE. Intubated 3/16, suspect PNA based on CXR but negative resp cx and BAL, s/p 7d empiric merrem. Repeat pulm c/s 4/1, brovana, yupelri, singulair added. Trach 4/4, downsize to [facility] 4/11 ?Volume overload - seems euvolemic, lasix BID, goal even to 500 neg/24h, transition to PO 4/8. Echo 3/28, essentially normal.  ?Altered mental status - CT head 3/13, 3/16/ 3/17 normal (h/o VP shunt). Empiric cefepime for aspiration, 5d course, end 4/12. Resp cx today. Ucx 3/13 negative. Bcx 3/15 NGTD. LP 3/16 NGTD. CT A/P 3/16 negative. 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 Neurology on board, changed to keppra, cont EEG without sz, encephalopathy  resolved ?Anxiety - on valium 10q6 decr to 6q6 today, 50TID seroquel ?Diarrhea - d/c imodium ?vWD - humate P per Heme, end 3/12, stable ?Transaminitis - likely 2/2 TPN, now d/c'd ?Vomiting - seems post-tussive, improved, change to CL trach, restart TF at 1/2 goal for now ?Leukocytosis, fever - pending this AM, prior infectious workup unrevealing, curbside ID consult 3/28 with no add'l recs, LE duplex negative 4/8, CXR 4/8 unchanged, 150 diflucan for vaginal yeast infection concerns given 4/7, UA 4/8 negative ?FEN - NPO, OGT, tol TF at 45, incr to goal (65), abd pain improved when binder is  loose ?ABLA - stable ?DVT - SCDs, s/p DDVAP, LMWH, LE duplex, negative 4/8 ?Foley - d/c 4/5, replaced 4/8 for retention ?Dispo - 4NP, PT/OT/SLP, anticipate LTAC, awaiting insurance ? ?[name], MD ?Trauma & General Surgery ?Please use [email or web address] to contact on call provider ? ?06/08/2021 ? ?*Care during the described time interval was provided by me. I have reviewed this patient's available data, including medical history, events of note, physical examination and test results as part of my evaluation. ? ? ? ?

## 2021-06-09 LAB — GLUCOSE, CAPILLARY
Glucose-Capillary: 132 mg/dL — ABNORMAL HIGH (ref 70–99)
Glucose-Capillary: 108 mg/dL — ABNORMAL HIGH (ref 70–99)
Glucose-Capillary: 131 mg/dL — ABNORMAL HIGH (ref 70–99)
Glucose-Capillary: 127 mg/dL — ABNORMAL HIGH (ref 70–99)
Glucose-Capillary: 133 mg/dL — ABNORMAL HIGH (ref 70–99)
Glucose-Capillary: 139 mg/dL — ABNORMAL HIGH (ref 70–99)

## 2021-06-09 LAB — CBC
HCT: 27.5 % — ABNORMAL LOW (ref 36.0–46.0)
Hemoglobin: 8.6 g/dL — ABNORMAL LOW (ref 12.0–15.0)
MCH: 28.3 pg (ref 26.0–34.0)
MCHC: 31.3 g/dL (ref 30.0–36.0)
MCV: 90.5 fL (ref 80.0–100.0)
Platelets: 480 10*3/uL — ABNORMAL HIGH (ref 150–400)
RBC: 3.04 MIL/uL — ABNORMAL LOW (ref 3.87–5.11)
RDW: 14.1 % (ref 11.5–15.5)
WBC: 16.9 10*3/uL — ABNORMAL HIGH (ref 4.0–10.5)
nRBC: 0 % (ref 0.0–0.2)

## 2021-06-09 LAB — BASIC METABOLIC PANEL
Anion gap: 13 (ref 5–15)
BUN: 10 mg/dL (ref 6–20)
CO2: 32 mmol/L (ref 22–32)
Calcium: 8.1 mg/dL — ABNORMAL LOW (ref 8.9–10.3)
Chloride: 92 mmol/L — ABNORMAL LOW (ref 98–111)
Creatinine, Ser: 0.63 mg/dL (ref 0.44–1.00)
GFR, Estimated: >60 mL/min (ref >60)
Glucose, Bld: 126 mg/dL — ABNORMAL HIGH (ref 70–99)
Potassium: 3.2 mmol/L — ABNORMAL LOW (ref 3.5–5.1)
Sodium: 137 mmol/L (ref 135–145)

## 2021-06-09 MED ORDER — LOPERAMIDE HCL 2 MG PO CAPS: 2.0000 mg | ORAL_CAPSULE | Freq: Two times a day (BID) | ORAL | Status: DC

## 2021-06-09 MED ORDER — DIAZEPAM 2 MG PO TABS
6.0000 mg | ORAL_TABLET | Freq: Four times a day (QID) | ORAL | Status: AC
  Administered 2021-06-09 – 2021-06-11 (×7): 6 mg
  Filled 2021-06-09 (×7): qty 3

## 2021-06-09 MED ORDER — QUETIAPINE FUMARATE 50 MG PO TABS
25.0000 mg | ORAL_TABLET | Freq: Three times a day (TID) | ORAL | Status: DC
  Administered 2021-06-09 – 2021-06-10 (×4): 25 mg
  Filled 2021-06-09 (×4): qty 1

## 2021-06-09 MED ORDER — LOPERAMIDE HCL 1 MG/7.5ML PO SUSP
4.0000 mg | Freq: Two times a day (BID) | ORAL | Status: DC
  Administered 2021-06-10: 4 mg
  Filled 2021-06-09: qty 30

## 2021-06-09 MED ORDER — LOPERAMIDE HCL 2 MG PO CAPS
4.0000 mg | ORAL_CAPSULE | Freq: Two times a day (BID) | ORAL | Status: DC
  Filled 2021-06-09 (×2): qty 2

## 2021-06-09 MED ORDER — LOPERAMIDE HCL 1 MG/7.5ML PO SUSP
4.0000 mg | Freq: Two times a day (BID) | ORAL | Status: DC
  Filled 2021-06-09: qty 30

## 2021-06-09 MED ORDER — POTASSIUM CHLORIDE 20 MEQ PO PACK
40.0000 meq | PACK | Freq: Every day | ORAL | Status: DC
  Administered 2021-06-09 – 2021-06-15 (×7): 40 meq
  Filled 2021-06-09 (×7): qty 2

## 2021-06-09 MED ORDER — LOPERAMIDE HCL 2 MG PO CAPS
4.0000 mg | ORAL_CAPSULE | Freq: Two times a day (BID) | ORAL | Status: DC
  Administered 2021-06-09: 4 mg via ORAL

## 2021-06-09 MED ORDER — POTASSIUM CHLORIDE 20 MEQ PO PACK
40.0000 meq | PACK | Freq: Once | ORAL | Status: AC
  Administered 2021-06-09: 40 meq
  Filled 2021-06-09: qty 2

## 2021-06-09 NOTE — Progress Notes (Signed)
RT x2 went into pt's room to do trach change and found pt nauseous and throwing up. RT will attempt trach change next rounds if pt is feeling better. MD (Dr. Bobbye Morton) aware. ?

## 2021-06-09 NOTE — Progress Notes (Signed)
Speech Language Pathology Treatment: [name] Speaking valve  ?Patient Details ?Name: Cassidy Tran ?MRN: [id number] ?DOB: 12/02/1975 ?Today's Date: 06/09/2021 ?Time: 6606-3016 ?SLP Time Calculation (min) (ACUTE ONLY): 25 min ? ?Assessment / Plan / Recommendation ?Clinical Impression ? Patient seen by SLP for skilled treatment focused on PMV treatment and education of patient and Aunt regarding PMV usage and donning/doffing. Patient able to return demonstrate donning and doffing, however she later attempted to doff without using mirror/reverse phone camera and was starting to turn trach itself. Patient and SLP both in agreement for her to try wearing it more frequently but to have assistance when donning and doffing. Patient was able to achieve clear voice immediately after PMV donned. SpO2 remained close to 100% and RR did not rise above 28. No evidence of back pressure when PMV doffed. SLP spoke with RN regarding plan for patient to wear PMV more frequently and have assistance with donning//doffing. SLP will continue follow for PMV and dysphagia treatment. ? ?  ?HPI HPI: Pt is a 46 y.o. female who presented to [facility] hospital on 05/05/2021 and underwent ex-lap with cholecystectomy and R hemicolectomy for neuroendocrine tumor. Pt developed respiratory failure due to volume overload and required intubation. Pt developed AMS; underwent lumbar punture 3/16 which was negative.  ETT 3/16-trach 4/3. Cortrak 3/15, G-tube 4/3. PMH: ASCUS, clotting disorder, seizures, PNA, Von Willebrand disease, VP shunt placement. Trach changed to #6 cuffless on 4/11. MBS on 4/11 with swallow function appearing WFL and without aspiration or penetration, full liquids (thin) recommended. MD increased tube feeding amount on 4/12. ?  ?   ?SLP Plan ? Continue with current plan of care ? ?  ?  ?Recommendations for follow up therapy are one component of a multi-disciplinary discharge planning process, led by the attending physician.  Recommendations  may be updated based on patient status, additional functional criteria and insurance authorization. ?  ? ?Recommendations  ?   ?   ?    ?   ? ? ? ? Follow Up Recommendations: Acute inpatient rehab (3hours/day) ?Assistance recommended at discharge: Intermittent Supervision/Assistance ?SLP Visit Diagnosis: Aphonia (R49.1) ?Plan: Continue with current plan of care ? ? ? ? ?  ?  ? ?Sonia Baller, MA, CCC-SLP ?Speech Therapy ? ?

## 2021-06-09 NOTE — Progress Notes (Signed)
Pt still is not feeling well. RT will do trach change downsize to a 4 tomorrow (4/13). RT will continue to monitor. ?

## 2021-06-09 NOTE — Progress Notes (Signed)
Occupational Therapy Treatment ?Patient Details ?Name: Cassidy Tran ?MRN: [id number] ?DOB: 02-10-1976 ?Today's Date: 06/09/2021 ? ? ?History of present illness 46 y.o. female presents to [facility] hospital on 05/05/2021 and underwent ex-lap with cholecystectomy and R hemicolectomy for neuroendocrine tumor. Pt developed respiratory failure due to volume overload and required intubation on 3/16. Pt developed AMS during admission with rising white cell counts, underwent lumbar punture on 3/16. Pt underwent bronchoscopy on 3/21. Bronchoscopy 4/1, tracheostomy 4/3. PMH includes ASCUS, clotting disorder, seizures, PNA, Von Willebrand disease, VP shunt placement. ?  ?OT comments ? Patient with good progression toward patient focused goals.  Patient beginning to mobilize better, with improved balance.  Patient needing hand held assist of up to Mod A for transfer, decreased safety and patient impulsive needing to use bedside commode.  OT will continue efforts in the acute setting to maximize her functional status.  AIR continues to be an appropriate level of intensity prior to returning home.    ? ?Recommendations for follow up therapy are one component of a multi-disciplinary discharge planning process, led by the attending physician.  Recommendations may be updated based on patient status, additional functional criteria and insurance authorization. ?   ?Follow Up Recommendations ? Acute inpatient rehab (3hours/day)  ?  ?Assistance Recommended at Discharge Frequent or constant Supervision/Assistance  ?Patient can return home with the following ? Two people to help with walking and/or transfers;A lot of help with bathing/dressing/bathroom;Assistance with cooking/housework;Assistance with feeding;Direct supervision/assist for medications management;Help with stairs or ramp for entrance;Assist for transportation;Direct supervision/assist for financial management ?  ?Equipment Recommendations ? BSC/3in1;Tub/shower seat  ?   ?Recommendations for Other Services   ? ?  ?Precautions / Restrictions Precautions ?Precautions: Fall ?Precaution Comments: trach collar, watch HR  ? ? ?  ? ?Mobility Bed Mobility ?Overal bed mobility: Needs Assistance ?Bed Mobility: Sidelying to Sit ?  ?Sidelying to sit: Min guard ?  ?  ?  ?  ?Patient Response: Flat affect ? ?Transfers ?Overall transfer level: Needs assistance ?Equipment used: 1 person hand held assist ?Transfers: Sit to/from Stand, Bed to chair/wheelchair/BSC ?Sit to Stand: Min assist ?  ?  ?Step pivot transfers: Mod assist ?  ?  ?  ?  ?  ?Balance Overall balance assessment: Needs assistance ?Sitting-balance support: No upper extremity supported, Feet supported ?Sitting balance-Leahy Scale: Fair ?  ?  ?Standing balance support: Bilateral upper extremity supported ?Standing balance-Leahy Scale: Poor ?  ?  ?  ?  ?  ?  ?  ?  ?  ?  ?  ?  ?   ? ?ADL either performed or assessed with clinical judgement  ? ?ADL   ?  ?  ?  ?  ?  ?  ?  ?  ?  ?  ?  ?  ?  ?  ?  ?  ?  ?  ?  ?  ?  ? ?Extremity/Trunk Assessment Upper Extremity Assessment ?Upper Extremity Assessment: Generalized weakness ?  ?Lower Extremity Assessment ?Lower Extremity Assessment: Defer to PT evaluation ?  ?  ?  ? ?Vision   ?  ?  ?Perception   ?  ?Praxis   ?  ? ?Cognition Arousal/Alertness: Awake/alert ?Behavior During Therapy: Flat affect, Impulsive ?Overall Cognitive Status: Impaired/Different from baseline ?  ?  ?  ?  ?  ?  ?  ?  ?  ?  ?  ?  ?  ?  ?  ?  ?  ?  ?  ?   ?   ? ?  ?   ? ? ?  ?    ? ? ?  Pertinent Vitals/ Pain       Pain Assessment ?Pain Assessment: No/denies pain ?Pain Intervention(s): Monitored during session ? ?   ?  ?  ?  ?  ?  ?  ?  ?  ?  ?  ?  ?  ?  ?  ?  ?  ?  ?  ? ?  ?    ?  ?  ?  ?   ? ?Frequency ? Min 2X/week  ? ? ? ? ?  ?Progress Toward Goals ? ?OT Goals(current goals can now be found in the care plan section) ? Progress towards OT goals: Progressing toward goals ? ?Acute Rehab OT Goals ?OT Goal Formulation: With  patient ?Time For Goal Achievement: 06/16/21 ?Potential to Achieve Goals: Good  ?Plan Discharge plan remains appropriate   ? ?Co-evaluation ? ? ?   ?  ?  ?  ?  ? ?  ?AM-PAC OT "6 Clicks" Daily Activity     ?Outcome Measure ? ? Help from another person eating meals?: Total ?Help from another person taking care of personal grooming?: A Little ?Help from another person toileting, which includes using toliet, bedpan, or urinal?: A Lot ?Help from another person bathing (including washing, rinsing, drying)?: A Lot ?Help from another person to put on and taking off regular upper body clothing?: A Little ?Help from another person to put on and taking off regular lower body clothing?: A Lot ?6 Click Score: 13 ? ?  ?End of Session Equipment Utilized During Treatment: Oxygen ? ?OT Visit Diagnosis: Unsteadiness on feet (R26.81);Muscle weakness (generalized) (M62.81);Dizziness and giddiness (R42);Pain ?  ?Activity Tolerance Patient tolerated treatment well ?  ?Patient Left in chair;with family/visitor present;with call bell/phone within reach ?  ?Nurse Communication Mobility status ?  ? ?   ? ?Time: 2426-8341 ?OT Time Calculation (min): 15 min ? ?Charges: OT General Charges ?$OT Visit: 1 Visit ?OT Treatments ?$Self Care/Home Management : 8-22 mins ? ?06/09/2021 ? ?RP, OTR/L ? ?Acute Rehabilitation Services ? ?Office:  [phone] ? ? ?Pristine Gladhill D Tobe Kervin ?06/09/2021, 11:14 AM ? ? ?

## 2021-06-09 NOTE — Progress Notes (Signed)
Physical Therapy Treatment ?Patient Details ?Name: Cassidy Tran ?MRN: [id number] ?DOB: 02/17/1976 ?Today's Date: 06/09/2021 ? ? ?History of Present Illness 46 y.o. female presents to [facility] hospital on 05/05/2021 and underwent ex-lap with cholecystectomy and R hemicolectomy for neuroendocrine tumor. Pt developed respiratory failure due to volume overload and required intubation on 3/16. Pt developed AMS during admission with rising white cell counts, underwent lumbar punture on 3/16. Pt underwent bronchoscopy on 3/21. Bronchoscopy 4/1, tracheostomy 4/3. PMH includes ASCUS, clotting disorder, seizures, PNA, Von Willebrand disease, VP shunt placement. ? ?  ?PT Comments  ? ? Pt progresses to gait training this session, demonstrating R foot drag and instability with backward stepping. Pt continues to fatigue quickly but is able to tolerate multiple ambulation trials today. PT continues to encourage therapeutic exercise and continued aggressive mobilization. PT continues to recommend AIR admission.   ?Recommendations for follow up therapy are one component of a multi-disciplinary discharge planning process, led by the attending physician.  Recommendations may be updated based on patient status, additional functional criteria and insurance authorization. ? ?Follow Up Recommendations ? Acute inpatient rehab (3hours/day) ?  ?  ?Assistance Recommended at Discharge Frequent or constant Supervision/Assistance  ?Patient can return home with the following Assistance with cooking/housework;Assistance with feeding;Direct supervision/assist for medications management;Direct supervision/assist for financial management;Assist for transportation;Help with stairs or ramp for entrance;A lot of help with walking and/or transfers;A lot of help with bathing/dressing/bathroom ?  ?Equipment Recommendations ? Rolling walker (2 wheels);Wheelchair (measurements PT);Wheelchair cushion (measurements PT);Hospital bed  ?  ?Recommendations for Other  Services   ? ? ?  ?Precautions / Restrictions Precautions ?Precautions: Fall ?Precaution Comments: trach collar, watch HR ?Restrictions ?Weight Bearing Restrictions: No  ?  ? ?Mobility ? Bed Mobility ?Overal bed mobility: Needs Assistance ?Bed Mobility: Rolling, Sidelying to Sit, Sit to Supine ?Rolling: Min guard ?Sidelying to sit: Min guard ?  ?Sit to supine: Mod assist ?  ?General bed mobility comments: spouse assists with elevating LEs onto bed ?  ? ?Transfers ?Overall transfer level: Needs assistance ?Equipment used: Rolling walker (2 wheels) ?Transfers: Sit to/from Stand ?Sit to Stand: Min assist ?  ?  ?  ?  ?  ?  ?  ? ?Ambulation/Gait ?Ambulation/Gait assistance: Min assist, Max assist ?Gait Distance (Feet): 3 Feet (3' forward and backward, minA with forward steps, maxA with backward. Pt performs 2 trials) ?Assistive device: Rolling walker (2 wheels) ?Gait Pattern/deviations: Step-to pattern, Decreased dorsiflexion - right ?Gait velocity: reduced ?Gait velocity interpretation: <1.31 ft/sec, indicative of household ambulator ?  ?General Gait Details: pt with slowed step-to gait, R foot drag and external rotation. persistent posterior lean with backwards steps to bed ? ? ?Stairs ?  ?  ?  ?  ?  ? ? ?Wheelchair Mobility ?  ? ?Modified Rankin (Stroke Patients Only) ?  ? ? ?  ?Balance Overall balance assessment: Needs assistance ?Sitting-balance support: No upper extremity supported, Feet supported ?Sitting balance-Leahy Scale: Fair ?  ?  ?Standing balance support: Bilateral upper extremity supported, Reliant on assistive device for balance ?Standing balance-Leahy Scale: Poor ?Standing balance comment: min-modA ?  ?  ?  ?  ?  ?  ?  ?  ?  ?  ?  ?  ? ?  ?Cognition Arousal/Alertness: Awake/alert ?Behavior During Therapy: [facility] for tasks assessed/performed ?Overall Cognitive Status: Impaired/Different from baseline ?Area of Impairment: Awareness ?  ?  ?  ?  ?  ?  ?  ?  ?  ?  ?  ?  ?  ?  Awareness: Emergent ?  ?  ?  ?  ? ?   ?Exercises   ? ?  ?General Comments General comments (skin integrity, edema, etc.): pt on 10L trach collar, 60% FiO2. Pt desats to mid 60s with mobility but recovers within 2-3 minutes with seated rest break ?  ?  ? ?Pertinent Vitals/Pain Pain Assessment ?Pain Assessment: No/denies pain  ? ? ?Home Living   ?  ?  ?  ?  ?  ?  ?  ?  ?  ?   ?  ?Prior Function    ?  ?  ?   ? ?PT Goals (current goals can now be found in the care plan section) Acute Rehab PT Goals ?Patient Stated Goal: to improve strength and reduce caregiver burden ?Progress towards PT goals: Progressing toward goals ? ?  ?Frequency ? ? ? Min 4X/week ? ? ? ?  ?PT Plan Current plan remains appropriate  ? ? ?Co-evaluation   ?  ?  ?  ?  ? ?  ?AM-PAC PT "6 Clicks" Mobility   ?Outcome Measure ? Help needed turning from your back to your side while in a flat bed without using bedrails?: A Little ?Help needed moving from lying on your back to sitting on the side of a flat bed without using bedrails?: A Little ?Help needed moving to and from a bed to a chair (including a wheelchair)?: A Lot ?Help needed standing up from a chair using your arms (e.g., wheelchair or bedside chair)?: A Little ?Help needed to walk in hospital room?: Total ?Help needed climbing 3-5 steps with a railing? : Total ?6 Click Score: 13 ? ?  ?End of Session Equipment Utilized During Treatment: Oxygen ?Activity Tolerance: Patient limited by fatigue ?Patient left: in bed;with call bell/phone within reach;with family/visitor present ?Nurse Communication: Mobility status ?PT Visit Diagnosis: Muscle weakness (generalized) (M62.81);Pain;Other abnormalities of gait and mobility (R26.89) ?  ? ? ?Time: 2706-2376 ?PT Time Calculation (min) (ACUTE ONLY): 26 min ? ?Charges:  $Gait Training: 8-22 mins ?$Therapeutic Activity: 8-22 mins          ?          ? ?Zenaida Niece, PT, DPT ?Acute Rehabilitation ?Pager: [phone] ?Office [phone] ? ? ? ?Zenaida Niece ?06/09/2021, 4:36 PM ? ?

## 2021-06-09 NOTE — Progress Notes (Signed)
? ?Trauma/Critical Care Follow Up Note ? ?Subjective:  ?  ?Overnight Issues:  ? ?Objective:  ?Vital signs for last 24 hours: ?Temp:  [98.2 ?F (36.8 ?C)-98.5 ?F (36.9 ?C)] 98.2 ?F (36.8 ?C) (04/13 0300) ?Pulse Rate:  [100-110] 109 (04/13 0300) ?Resp:  [17-28] 20 (04/13 0300) ?BP: (114-147)/(55-68) 133/55 (04/13 0300) ?SpO2:  [90 %-97 %] 95 % (04/13 0300) ?FiO2 (%):  [35 %-60 %] 40 % (04/13 0141) ?Weight:  [85.6 kg] 85.6 kg (04/13 0300) ? ?Hemodynamic parameters for last 24 hours: ?  ? ?Intake/Output from previous day: ?04/12 0701 - 04/13 0700 ?In: 240 [P.O.:240] ?Out: 1425 [Urine:1425]  ?Intake/Output this shift: ?No intake/output data recorded. ? ?Vent settings for last 24 hours: ?FiO2 (%):  [35 %-60 %] 40 % ? ?Physical Exam:  ?Gen: comfortable, no distress ?Neuro: non-focal exam ?HEENT: PERRL ?Neck: supple, trached with PMV in place ?CV: RRR ?Pulm: unlabored breathing on TC ?Abd: soft, NT ?GU: clear yellow urine, foley ?Extr: wwp, no edema ? ? ?Results for orders placed or performed during the hospital encounter of 05/05/21 (from the past 24 hour(s))  ?Glucose, capillary     Status: Abnormal  ? Collection Time: 06/08/21  8:09 AM  ?Result Value Ref Range  ? Glucose-Capillary 120 (H) 70 - 99 mg/dL  ? Comment 1 Notify RN   ? Comment 2 Document in Chart   ?Glucose, capillary     Status: Abnormal  ? Collection Time: 06/08/21 11:59 AM  ?Result Value Ref Range  ? Glucose-Capillary 120 (H) 70 - 99 mg/dL  ? Comment 1 Notify RN   ? Comment 2 Document in Chart   ?Glucose, capillary     Status: Abnormal  ? Collection Time: 06/08/21  4:02 PM  ?Result Value Ref Range  ? Glucose-Capillary 118 (H) 70 - 99 mg/dL  ? Comment 1 Notify RN   ? Comment 2 Document in Chart   ?Glucose, capillary     Status: Abnormal  ? Collection Time: 06/08/21  8:03 PM  ?Result Value Ref Range  ? Glucose-Capillary 150 (H) 70 - 99 mg/dL  ?Glucose, capillary     Status: Abnormal  ? Collection Time: 06/08/21 11:53 PM  ?Result Value Ref Range  ?  Glucose-Capillary 103 (H) 70 - 99 mg/dL  ?Glucose, capillary     Status: Abnormal  ? Collection Time: 06/09/21  3:22 AM  ?Result Value Ref Range  ? Glucose-Capillary 132 (H) 70 - 99 mg/dL  ?Basic metabolic panel     Status: Abnormal  ? Collection Time: 06/09/21  5:20 AM  ?Result Value Ref Range  ? Sodium 137 135 - 145 mmol/L  ? Potassium 3.2 (L) 3.5 - 5.1 mmol/L  ? Chloride 92 (L) 98 - 111 mmol/L  ? CO2 32 22 - 32 mmol/L  ? Glucose, Bld 126 (H) 70 - 99 mg/dL  ? BUN 10 6 - 20 mg/dL  ? Creatinine, Ser 0.63 0.44 - 1.00 mg/dL  ? Calcium 8.1 (L) 8.9 - 10.3 mg/dL  ? GFR, Estimated >60 >60 mL/min  ? Anion gap 13 5 - 15  ?CBC     Status: Abnormal  ? Collection Time: 06/09/21  5:20 AM  ?Result Value Ref Range  ? WBC 16.9 (H) 4.0 - 10.5 K/uL  ? RBC 3.04 (L) 3.87 - 5.11 MIL/uL  ? Hemoglobin 8.6 (L) 12.0 - 15.0 g/dL  ? HCT 27.5 (L) 36.0 - 46.0 %  ? MCV 90.5 80.0 - 100.0 fL  ? MCH 28.3 26.0 - 34.0 pg  ?  MCHC 31.3 30.0 - 36.0 g/dL  ? RDW 14.1 11.5 - 15.5 %  ? Platelets 480 (H) 150 - 400 K/uL  ? nRBC 0.0 0.0 - 0.2 %  ? ? ?Assessment & Plan: ?The plan of care was discussed with the bedside nurse for the day, who is in agreement with this plan and no additional concerns were raised.  ? ?Present on Admission: ? Neuroendocrine carcinoma ([place]) ? ? ? LOS: 35 days  ? ?Additional comments:I reviewed the patient's new clinical lab test results. [name]  and I reviewed the patients new imaging test results.   ? ?Neuroendocrine tumor on colonoscopy - s/p exlap, R hemicolectomy, cholecystectomy 3/9. Well differentiated neuroendocrine tumor with 1/7 +nodes. D/w Dr. Marin Olp 3/15 will likely need adjuvant chemo. Staples out 3/24. Bottom 2cm of wound granulating ?Respiratory failure 2/2 volume overload/?PNA - CT chest 3/11 and 3/13 with no PE. Intubated 3/16, suspect PNA based on CXR but negative resp cx and BAL, s/p 7d empiric merrem. Repeat pulm c/s 4/1, brovana, yupelri, singulair added. Trach 4/4, downsize to [facility] 4/11 ?Volume overload - seems  euvolemic, lasix BID, goal even to 500 neg/24h, transition to PO 4/8. Echo 3/28, essentially normal.  ?Altered mental status - CT head 3/13, 3/16/ 3/17 normal (h/o VP shunt). Empiric cefepime for aspiration, 5d course, end 4/12. Resp cx today. Ucx 3/13 negative. Bcx 3/15 NGTD. LP 3/16 NGTD. CT A/P 3/16 negative. 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 Neurology on board, changed to keppra, cont EEG without sz, encephalopathy resolved ?Anxiety - on valium 10q6 decr to 6q6 today, decrease to 25TID seroquel, d/c scheduled oxy ?Diarrhea - restart imodium, may need to add cholestyramine back  ?vWD - humate P per Heme, end 3/12, stable ?Transaminitis - likely 2/2 TPN, now d/c'd ?Vomiting - seems post-tussive, much improved with CL trach, downsize to 4CL today, tol full feed and cleared for FLD ?Leukocytosis, fever - 16 this AM, prior infectious workup unrevealing, curbside ID consult 3/28 with no add'l recs, LE duplex negative 4/8, CXR 4/8 unchanged, 150 diflucan for vaginal yeast infection concerns given 4/7, UA 4/8 negative, days 5 of empiric zosyn, d/c today ?FEN - NPO, OGT, tol TF at 45, incr to goal (65), abd pain improved when binder is loose, replete hypokalemia ?ABLA - stable ?DVT - SCDs, s/p DDVAP, LMWH, LE duplex, negative 4/8 ?Foley - d/c 4/5, replaced 4/8 for retention ?Dispo - 4NP, PT/OT/SLP, anticipate LTAC, awaiting insurance ? ? ?[name], MD ?Trauma & General Surgery ?Please use [email or web address] to contact on call provider ? ?06/09/2021 ? ?*Care during the described time interval was provided by me. I have reviewed this patient's available data, including medical history, events of note, physical examination and test results as part of my evaluation. ? ? ? ?

## 2021-06-10 ENCOUNTER — Inpatient Hospital Stay (HOSPITAL_COMMUNITY): Payer: No Typology Code available for payment source

## 2021-06-10 LAB — GLUCOSE, CAPILLARY
Glucose-Capillary: 125 mg/dL — ABNORMAL HIGH (ref 70–99)
Glucose-Capillary: 122 mg/dL — ABNORMAL HIGH (ref 70–99)
Glucose-Capillary: 121 mg/dL — ABNORMAL HIGH (ref 70–99)
Glucose-Capillary: 108 mg/dL — ABNORMAL HIGH (ref 70–99)
Glucose-Capillary: 123 mg/dL — ABNORMAL HIGH (ref 70–99)
Glucose-Capillary: 119 mg/dL — ABNORMAL HIGH (ref 70–99)

## 2021-06-10 LAB — CBC
HCT: 28.5 % — ABNORMAL LOW (ref 36.0–46.0)
Hemoglobin: 9 g/dL — ABNORMAL LOW (ref 12.0–15.0)
MCH: 28.4 pg (ref 26.0–34.0)
MCHC: 31.6 g/dL (ref 30.0–36.0)
MCV: 89.9 fL (ref 80.0–100.0)
Platelets: 547 10*3/uL — ABNORMAL HIGH (ref 150–400)
RBC: 3.17 MIL/uL — ABNORMAL LOW (ref 3.87–5.11)
RDW: 14.1 % (ref 11.5–15.5)
WBC: 22.2 10*3/uL — ABNORMAL HIGH (ref 4.0–10.5)
nRBC: 0 % (ref 0.0–0.2)

## 2021-06-10 LAB — BASIC METABOLIC PANEL
Anion gap: 8 (ref 5–15)
BUN: 8 mg/dL (ref 6–20)
CO2: 33 mmol/L — ABNORMAL HIGH (ref 22–32)
Calcium: 8.6 mg/dL — ABNORMAL LOW (ref 8.9–10.3)
Chloride: 96 mmol/L — ABNORMAL LOW (ref 98–111)
Creatinine, Ser: 0.54 mg/dL (ref 0.44–1.00)
GFR, Estimated: >60 mL/min (ref >60)
Glucose, Bld: 123 mg/dL — ABNORMAL HIGH (ref 70–99)
Potassium: 3.2 mmol/L — ABNORMAL LOW (ref 3.5–5.1)
Sodium: 137 mmol/L (ref 135–145)

## 2021-06-10 MED ORDER — CHOLESTYRAMINE LIGHT 4 G PO PACK
4.0000 g | PACK | Freq: Two times a day (BID) | ORAL | Status: DC
  Administered 2021-06-10 (×2): 4 g via ORAL
  Filled 2021-06-10 (×3): qty 1

## 2021-06-10 MED ORDER — DIAZEPAM 2 MG PO TABS
4.0000 mg | ORAL_TABLET | Freq: Four times a day (QID) | ORAL | Status: AC
  Administered 2021-06-11 – 2021-06-14 (×12): 4 mg via ORAL
  Filled 2021-06-10 (×12): qty 2

## 2021-06-10 MED ORDER — QUETIAPINE 12.5 MG HALF TABLET
12.5000 mg | ORAL_TABLET | Freq: Three times a day (TID) | ORAL | Status: DC
  Administered 2021-06-10 (×2): 12.5 mg
  Filled 2021-06-10 (×3): qty 1

## 2021-06-10 MED ORDER — LOPERAMIDE HCL 1 MG/7.5ML PO SUSP
4.0000 mg | Freq: Three times a day (TID) | ORAL | Status: DC
  Administered 2021-06-10 – 2021-06-15 (×15): 4 mg
  Filled 2021-06-10 (×17): qty 30

## 2021-06-10 MED ORDER — SACCHAROMYCES BOULARDII 250 MG PO CAPS
250.0000 mg | ORAL_CAPSULE | Freq: Two times a day (BID) | ORAL | Status: DC
  Administered 2021-06-10 – 2021-06-15 (×11): 250 mg
  Filled 2021-06-10 (×11): qty 1

## 2021-06-10 MED ORDER — OSMOLITE 1.5 CAL PO LIQD
1000.0000 mL | ORAL | Status: DC
  Administered 2021-06-10 – 2021-06-12 (×2): 1000 mL

## 2021-06-10 MED ORDER — POTASSIUM CHLORIDE 20 MEQ PO PACK
40.0000 meq | PACK | Freq: Once | ORAL | Status: AC
  Administered 2021-06-10: 40 meq
  Filled 2021-06-10: qty 2

## 2021-06-10 NOTE — Progress Notes (Signed)
Nutrition Follow-up ? ?DOCUMENTATION CODES:  ? ?Obesity unspecified ? ?INTERVENTION:  ? ?Tube feeding via PEG tube: ?- Change to Osmolite 1.5 @ 65 ml/hr (1560 ml/day) ?- Continue ProSource TF 45 ml daily ? ?Tube feeding regimen provides 2380 kcal, 109 grams of protein, and 1189 ml of H2O. ? ?- Add probiotic per tube, verbal with readback order placed per Dr. Bobbye Morton ? ?- Continue Magic Cup TID with meals, each supplement provides 290 kcal and 9 grams of protein ? ?- Encourage PO intake as able ? ?NUTRITION DIAGNOSIS:  ? ?Increased nutrient needs related to acute illness as evidenced by estimated needs. ? ?Ongoing, being addressed via TF ? ?GOAL:  ? ?Patient will meet greater than or equal to 90% of their needs ? ?Met via TF ? ?MONITOR:  ? ?PO intake, Supplement acceptance, Diet advancement, Labs, Weight trends, TF tolerance, Skin ? ?REASON FOR ASSESSMENT:  ? ?Other (Cortrak) ?  ? ?ASSESSMENT:  ? 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 PMH significant for type IA von Willebrand disease. ? ?03/09 - s/p ex-lap, R hemicolectomy, cholecystectomy for well differentiated neuroendocrine turmor with 1/7 + nodes ?03/15 - s/p Cortrak placement (tip gastric), started TF, Cortrak removed later that night ?03/16 - intubated, OG tube placed  ?03/17 - developed emesis, TF held ?03/18 - TPN initiated ?03/20 - TF resumed and advanced to goal ?04/03 - s/p trach and PEG ?04/04 - TF held for nausea/abd pain ?04/05 - TF restarted at 1/2 goal rate ?04/06 - TF increased to goal ?04/08 - TF held due to vomiting episode ?04/09 - TF restarted at 1/2 goal rate ?04/11 - s/p MBS, diet advanced to full liquids ?04/12 - TF increased to goal by MD ? ?Consult received to evaluate tube feeding regimen given ongoing loose stool. Pt with 13 documented stool occurrences over the last 24 hours. Per discussion with Dr. Bobbye Morton, pt has lost both her gallbladder and her ileocecal valve during this admission which means that frequent,  loose stools are to be expected. MD planning to add cholestyramine back today. Pt with imodium already ordered. MD okay with adding probiotic as well. ? ?RD will adjust tube feeding regimen slightly from Vital 1.5 formula to Osmolite 1.5. Osmolite 1.5 tube feeding regimen will provide less overall dietary fat which may help improve diarrhea. RD hesitant to change to higher fiber formula like Jevity 1.5 given pt with ongoing N/V and fiber will delay gastric emptying further. Per MD note, pt's family reports that emesis is post-tussive. ? ?Discussed plan with RN. Will monitor for tube feeding tolerance and adjust regimen as appropriate. Pt remains on a full liquid diet with minimal PO intake. ? ?Admit weight: 93.3 kg ?Current weight: 86.7 kg ? ?Current TF: Vital 1.5 @ 65 ml/hr, ProSource TF 45 ml daily ? ?Medications reviewed and include: lasix 80 mg BID, SSI q 4 hours, imodium 4 mg BID, klor-con 40 mEq daily, prevalite 4 grams BID ? ?Labs reviewed: potassium 3.2, WBC 22.2, hemoglobin 9.0 ?CBG's: 121-139 x 24 hours ? ?UOP: 2400 ml x 24 hours ?I/O's: -3.0 L since admit ? ?Diet Order:   ?Diet Order   ? ?       ?  Diet full liquid Room service appropriate? Yes; Fluid consistency: Thin  Diet effective now       ?  ? ?  ?  ? ?  ? ? ?EDUCATION NEEDS:  ? ?Education needs have been addressed ? ?Skin:  Skin Assessment: ?Skin Integrity  Issues: ?Stage I: L heel ?Incisions: abdomen (closed), neck ?Other: MASD anus ? ?Last BM:  06/10/21 multiple type 7 ? ?Height:  ? ?Ht Readings from Last 1 Encounters:  ?05/12/21 '5\' 3"'  (1.6 m)  ? ? ?Weight:  ? ?Wt Readings from Last 1 Encounters:  ?06/10/21 86.7 kg  ? ? ?Ideal Body Weight:  52.3 kg ? ?BMI:  Body mass index is 33.86 kg/m?. ? ?Estimated Nutritional Needs:  ? ?Kcal:  2200-2400 ? ?Protein:  110-130 grams ? ?Fluid:  >/= 2.0 L ? ? ? ?Gustavus Bryant, MS, RD, LDN ?Inpatient Clinical Dietitian ?Please see AMiON for contact information. ? ?

## 2021-06-10 NOTE — Progress Notes (Signed)
? ?Trauma/Critical Care Follow Up Note ? ?Subjective:  ?  ?Overnight Issues:  ? ?Objective:  ?Vital signs for last 24 hours: ?Temp:  [97.8 ?F (36.6 ?C)-98.6 ?F (37 ?C)] 97.8 ?F (36.6 ?C) (04/14 0715) ?Pulse Rate:  [103-119] 103 (04/14 0715) ?Resp:  [20-33] 28 (04/14 0715) ?BP: (119-156)/(52-74) 140/69 (04/14 0715) ?SpO2:  [91 %-98 %] 95 % (04/14 0900) ?FiO2 (%):  [40 %-60 %] 40 % (04/14 0900) ?Weight:  [86.7 kg] 86.7 kg (04/14 0500) ? ?Hemodynamic parameters for last 24 hours: ?  ? ?Intake/Output from previous day: ?04/13 0701 - 04/14 0700 ?In: 300 [P.O.:300] ?Out: 2400 [Urine:2400]  ?Intake/Output this shift: ?No intake/output data recorded. ? ?Vent settings for last 24 hours: ?FiO2 (%):  [40 %-60 %] 40 % ? ?Physical Exam:  ?  ?Gen: comfortable, no distress ?Neuro: non-focal exam ?HEENT: PERRL ?Neck: supple ?CV: RRR ?Pulm: unlabored breathing ?Abd: soft, NT ?GU: clear yellow urine ?Extr: wwp, no edema ? ? ?Results for orders placed or performed during the hospital encounter of 05/05/21 (from the past 24 hour(s))  ?Glucose, capillary     Status: Abnormal  ? Collection Time: 06/09/21 11:48 AM  ?Result Value Ref Range  ? Glucose-Capillary 131 (H) 70 - 99 mg/dL  ?Glucose, capillary     Status: Abnormal  ? Collection Time: 06/09/21  3:41 PM  ?Result Value Ref Range  ? Glucose-Capillary 127 (H) 70 - 99 mg/dL  ?Glucose, capillary     Status: Abnormal  ? Collection Time: 06/09/21  8:19 PM  ?Result Value Ref Range  ? Glucose-Capillary 133 (H) 70 - 99 mg/dL  ?Glucose, capillary     Status: Abnormal  ? Collection Time: 06/09/21 11:28 PM  ?Result Value Ref Range  ? Glucose-Capillary 139 (H) 70 - 99 mg/dL  ?Glucose, capillary     Status: Abnormal  ? Collection Time: 06/10/21  3:15 AM  ?Result Value Ref Range  ? Glucose-Capillary 125 (H) 70 - 99 mg/dL  ?Basic metabolic panel     Status: Abnormal  ? Collection Time: 06/10/21  3:43 AM  ?Result Value Ref Range  ? Sodium 137 135 - 145 mmol/L  ? Potassium 3.2 (L) 3.5 - 5.1 mmol/L  ?  Chloride 96 (L) 98 - 111 mmol/L  ? CO2 33 (H) 22 - 32 mmol/L  ? Glucose, Bld 123 (H) 70 - 99 mg/dL  ? BUN 8 6 - 20 mg/dL  ? Creatinine, Ser 0.54 0.44 - 1.00 mg/dL  ? Calcium 8.6 (L) 8.9 - 10.3 mg/dL  ? GFR, Estimated >60 >60 mL/min  ? Anion gap 8 5 - 15  ?CBC     Status: Abnormal  ? Collection Time: 06/10/21  3:43 AM  ?Result Value Ref Range  ? WBC 22.2 (H) 4.0 - 10.5 K/uL  ? RBC 3.17 (L) 3.87 - 5.11 MIL/uL  ? Hemoglobin 9.0 (L) 12.0 - 15.0 g/dL  ? HCT 28.5 (L) 36.0 - 46.0 %  ? MCV 89.9 80.0 - 100.0 fL  ? MCH 28.4 26.0 - 34.0 pg  ? MCHC 31.6 30.0 - 36.0 g/dL  ? RDW 14.1 11.5 - 15.5 %  ? Platelets 547 (H) 150 - 400 K/uL  ? nRBC 0.0 0.0 - 0.2 %  ?Glucose, capillary     Status: Abnormal  ? Collection Time: 06/10/21  7:50 AM  ?Result Value Ref Range  ? Glucose-Capillary 122 (H) 70 - 99 mg/dL  ? ? ?Assessment & Plan: ?The plan of care was discussed with the bedside nurse for  the day, who is in agreement with this plan and no additional concerns were raised.  ? ?Present on Admission: ? Neuroendocrine carcinoma ([place]) ? ? ? LOS: 36 days  ? ?Additional comments:I reviewed the patient's new clinical lab test results.   ? ?Neuroendocrine tumor on colonoscopy - s/p exlap, R hemicolectomy, cholecystectomy 3/9. Well differentiated neuroendocrine tumor with 1/7 +nodes. D/w Dr. Marin Olp 3/15 will likely need adjuvant chemo. Staples out 3/24. Bottom 2cm of wound granulating ?Respiratory failure 2/2 volume overload/?PNA - CT chest 3/11 and 3/13 with no PE. Intubated 3/16, suspect PNA based on CXR but negative resp cx and BAL, s/p 7d empiric merrem. Repeat pulm c/s 4/1, brovana, yupelri, singulair added. Trach 4/4, downsize to [facility] 4/11, 4CL today ?Volume overload - seems euvolemic, lasix BID, goal even to 500 neg/24h, transition to PO 4/8. Echo 3/28, essentially normal.  ?Altered mental status - CT head 3/13, 3/16/ 3/17 normal (h/o VP shunt). Empiric cefepime for aspiration, 5d course, end 4/12. Resp cx today. Ucx 3/13 negative. Bcx  3/15 NGTD. LP 3/16 NGTD. CT A/P 3/16 negative. 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 Neurology on board, changed to keppra, cont EEG without sz, encephalopathy resolved ?Anxiety - on valium 6q6, drop to 4q6 4/15 (ordered), 25TID seroquel, drop to 12.5 ?Diarrhea - restarted imodium, increase and add cholestyramine today ?vWD - humate P per Heme, end 3/12, stable ?Transaminitis - likely 2/2 TPN, now d/c'd ?Vomiting - seems post-tussive, much improved with CL trach, downsize to 4CL today, tol full feed and cleared for FLD ?Leukocytosis, fever - 16 this AM, prior infectious workup unrevealing, curbside ID consult 3/28 with no add'l recs, LE duplex negative 4/8, CXR 4/8 unchanged, 150 diflucan for vaginal yeast infection concerns given 4/7, UA 4/8 negative ?FEN - NPO, OGT, tol TF at 45, incr to goal (65), abd pain improved when binder is loose, replete hypokalemia. XR abd today, although family describes emesis as post-tussive ?ABLA - stable ?DVT - SCDs, s/p DDVAP, LMWH, LE duplex, negative 4/8 ?Foley - d/c 4/5, replaced 4/8 for retention, ToV today ?Dispo - 4NP, PT/OT/SLP, anticipate LTAC, awaiting insurance ? ?[name], MD ?Trauma & General Surgery ?Please use [email or web address] to contact on call provider ? ?06/10/2021 ? ?*Care during the described time interval was provided by me. I have reviewed this patient's available data, including medical history, events of note, physical examination and test results as part of my evaluation. ? ? ? ?

## 2021-06-11 ENCOUNTER — Inpatient Hospital Stay (HOSPITAL_COMMUNITY): Payer: No Typology Code available for payment source

## 2021-06-11 LAB — GLUCOSE, CAPILLARY
Glucose-Capillary: 102 mg/dL — ABNORMAL HIGH (ref 70–99)
Glucose-Capillary: 116 mg/dL — ABNORMAL HIGH (ref 70–99)
Glucose-Capillary: 120 mg/dL — ABNORMAL HIGH (ref 70–99)
Glucose-Capillary: 121 mg/dL — ABNORMAL HIGH (ref 70–99)
Glucose-Capillary: 121 mg/dL — ABNORMAL HIGH (ref 70–99)
Glucose-Capillary: 123 mg/dL — ABNORMAL HIGH (ref 70–99)

## 2021-06-11 LAB — CBC
HCT: 26.9 % — ABNORMAL LOW (ref 36.0–46.0)
Hemoglobin: 8.2 g/dL — ABNORMAL LOW (ref 12.0–15.0)
MCH: 27.7 pg (ref 26.0–34.0)
MCHC: 30.5 g/dL (ref 30.0–36.0)
MCV: 90.9 fL (ref 80.0–100.0)
Platelets: 486 10*3/uL — ABNORMAL HIGH (ref 150–400)
RBC: 2.96 MIL/uL — ABNORMAL LOW (ref 3.87–5.11)
RDW: 14.4 % (ref 11.5–15.5)
WBC: 17.4 10*3/uL — ABNORMAL HIGH (ref 4.0–10.5)
nRBC: 0 % (ref 0.0–0.2)

## 2021-06-11 LAB — BASIC METABOLIC PANEL
Anion gap: 5 (ref 5–15)
BUN: 8 mg/dL (ref 6–20)
CO2: 34 mmol/L — ABNORMAL HIGH (ref 22–32)
Calcium: 8.8 mg/dL — ABNORMAL LOW (ref 8.9–10.3)
Chloride: 99 mmol/L (ref 98–111)
Creatinine, Ser: 0.57 mg/dL (ref 0.44–1.00)
GFR, Estimated: >60 mL/min (ref >60)
Glucose, Bld: 119 mg/dL — ABNORMAL HIGH (ref 70–99)
Potassium: 4.1 mmol/L (ref 3.5–5.1)
Sodium: 138 mmol/L (ref 135–145)

## 2021-06-11 MED ORDER — MIDAZOLAM HCL 2 MG/2ML IJ SOLN
1.0000 mg | Freq: Four times a day (QID) | INTRAMUSCULAR | Status: DC | PRN
  Administered 2021-06-12: 1 mg via INTRAVENOUS
  Filled 2021-06-11: qty 2

## 2021-06-11 MED ORDER — CHOLESTYRAMINE LIGHT 4 G PO PACK
4.0000 g | PACK | Freq: Three times a day (TID) | ORAL | Status: DC
  Administered 2021-06-11 – 2021-06-14 (×12): 4 g via ORAL
  Filled 2021-06-11 (×15): qty 1

## 2021-06-11 MED ORDER — QUETIAPINE 12.5 MG HALF TABLET
12.5000 mg | ORAL_TABLET | Freq: Every day | ORAL | Status: DC
  Administered 2021-06-11 – 2021-06-14 (×4): 12.5 mg
  Filled 2021-06-11 (×5): qty 1

## 2021-06-11 NOTE — Progress Notes (Addendum)
Patient's legs were weakened while assisted to a bedside commode by a nurse tech and fell on her buttocks scrapping left facial on bed side rail. Pt c/o left periorbital discomfort. A/O x4, VS stable, abdominal surgical incision intact, all tubings intact. Dr. Bobbye Morton notified, Head CT non-contrast ordered. ?

## 2021-06-11 NOTE — Progress Notes (Signed)
RT note. ?Husband at bedside, discussed with husband the importance of RT to do trach care/ change inner cannula. Husband understands. Patient currently on 40% ATC sat 97%, resting comfortable, no labored breathing noted. RT will continue to monitor. IC #4 order per receptionist.  ?

## 2021-06-11 NOTE — Progress Notes (Signed)
? ?Trauma/Critical Care Follow Up Note ? ?Subjective:  ?  ?Overnight Issues:  ? ?Objective:  ?Vital signs for last 24 hours: ?Temp:  [97.6 ?F (36.4 ?C)-98.8 ?F (37.1 ?C)] 98.8 ?F (37.1 ?C) (04/15 0303) ?Pulse Rate:  [99-113] 101 (04/15 0402) ?Resp:  [19-27] 20 (04/15 0402) ?BP: (110-150)/(53-76) 123/63 (04/15 0303) ?SpO2:  [90 %-96 %] 94 % (04/15 0402) ?FiO2 (%):  [40 %] 40 % (04/15 0710) ?Weight:  [86.7 kg] 86.7 kg (04/15 0457) ? ?Hemodynamic parameters for last 24 hours: ?  ? ?Intake/Output from previous day: ?04/14 0701 - 04/15 0700 ?In: 1720 [I.V.:10; NG/GT:930] ?Out: 2150 [Urine:2150]  ?Intake/Output this shift: ?No intake/output data recorded. ? ?Vent settings for last 24 hours: ?FiO2 (%):  [40 %] 40 % ? ?Physical Exam:  ?Gen: comfortable, no distress ?Neuro: non-focal exam ?HEENT: PERRL ?Neck: supple, trached ?CV: RRR ?Pulm: unlabored breathing onTC ?Abd: soft, NT ?GU: TOV ?Extr: wwp, no edema ? ? ?Results for orders placed or performed during the hospital encounter of 05/05/21 (from the past 24 hour(s))  ?Glucose, capillary     Status: Abnormal  ? Collection Time: 06/10/21  7:50 AM  ?Result Value Ref Range  ? Glucose-Capillary 122 (H) 70 - 99 mg/dL  ?Glucose, capillary     Status: Abnormal  ? Collection Time: 06/10/21 11:54 AM  ?Result Value Ref Range  ? Glucose-Capillary 121 (H) 70 - 99 mg/dL  ?Glucose, capillary     Status: Abnormal  ? Collection Time: 06/10/21  3:38 PM  ?Result Value Ref Range  ? Glucose-Capillary 108 (H) 70 - 99 mg/dL  ?Glucose, capillary     Status: Abnormal  ? Collection Time: 06/10/21  7:33 PM  ?Result Value Ref Range  ? Glucose-Capillary 123 (H) 70 - 99 mg/dL  ?Glucose, capillary     Status: Abnormal  ? Collection Time: 06/10/21 11:16 PM  ?Result Value Ref Range  ? Glucose-Capillary 119 (H) 70 - 99 mg/dL  ?Glucose, capillary     Status: Abnormal  ? Collection Time: 06/11/21  3:21 AM  ?Result Value Ref Range  ? Glucose-Capillary 123 (H) 70 - 99 mg/dL  ?Basic metabolic panel      Status: Abnormal  ? Collection Time: 06/11/21  4:57 AM  ?Result Value Ref Range  ? Sodium 138 135 - 145 mmol/L  ? Potassium 4.1 3.5 - 5.1 mmol/L  ? Chloride 99 98 - 111 mmol/L  ? CO2 34 (H) 22 - 32 mmol/L  ? Glucose, Bld 119 (H) 70 - 99 mg/dL  ? BUN 8 6 - 20 mg/dL  ? Creatinine, Ser 0.57 0.44 - 1.00 mg/dL  ? Calcium 8.8 (L) 8.9 - 10.3 mg/dL  ? GFR, Estimated >60 >60 mL/min  ? Anion gap 5 5 - 15  ?CBC     Status: Abnormal  ? Collection Time: 06/11/21  4:57 AM  ?Result Value Ref Range  ? WBC 17.4 (H) 4.0 - 10.5 K/uL  ? RBC 2.96 (L) 3.87 - 5.11 MIL/uL  ? Hemoglobin 8.2 (L) 12.0 - 15.0 g/dL  ? HCT 26.9 (L) 36.0 - 46.0 %  ? MCV 90.9 80.0 - 100.0 fL  ? MCH 27.7 26.0 - 34.0 pg  ? MCHC 30.5 30.0 - 36.0 g/dL  ? RDW 14.4 11.5 - 15.5 %  ? Platelets 486 (H) 150 - 400 K/uL  ? nRBC 0.0 0.0 - 0.2 %  ? ? ?Assessment & Plan: ? ?Present on Admission: ? Neuroendocrine carcinoma ([place]) ? ? ? LOS: 37 days  ? ?  Additional comments:I reviewed the patient's new clinical lab test results.   and I reviewed the patients new imaging test results.   ? ?Neuroendocrine tumor on colonoscopy - s/p exlap, R hemicolectomy, cholecystectomy 3/9. Well differentiated neuroendocrine tumor with 1/7 +nodes. D/w Dr. Marin Olp 3/15 will likely need adjuvant chemo. Staples out 3/24. Bottom 2cm of wound granulating ?Respiratory failure 2/2 volume overload/?PNA - CT chest 3/11 and 3/13 with no PE. Intubated 3/16, suspect PNA based on CXR but negative resp cx and BAL, s/p 7d empiric merrem. Repeat pulm c/s 4/1, brovana, yupelri, singulair added. Trach 4/4, downsize to [facility] 4/11, 4CL today ?Volume overload - seems euvolemic, lasix BID, goal even to 500 neg/24h, transition to PO 4/8. Echo 3/28, essentially normal.  ?Altered mental status - CT head 3/13, 3/16/ 3/17 normal (h/o VP shunt). Empiric cefepime for aspiration, 5d course, end 4/12. Resp cx today. Ucx 3/13 negative. Bcx 3/15 NGTD. LP 3/16 NGTD. CT A/P 3/16 negative. Most likely depakote induced hyperammonemic  encephalopathy. Neurology on board, changed to keppra, cont EEG without sz, encephalopathy resolved ?Anxiety - on valium, drop to 4q6 4/15, 12.5TID seroquel, drop to HS only ?Diarrhea - improved, restarted imodium, increase cholestyramine today ?vWD - humate P per Heme, end 3/12, stable ?Transaminitis - likely 2/2 TPN, now d/c'd ?Vomiting - seems post-tussive, much improved with CL trach, downsize to 4CL 4/14, tol full feed and cleared for FLD ?Leukocytosis, fever - 17 this AM, prior infectious workup unrevealing, curbside ID consult 3/28 with no add'l recs, LE duplex negative 4/8, CXR 4/8 unchanged, 150 diflucan for vaginal yeast infection concerns given 4/7, UA 4/8 negative ?FEN - NPO, OGT, tol TF at 45, incr to goal (65), abd pain improved when binder is loose, XR abd today unremarkable ?ABLA - stable ?DVT - SCDs, s/p DDVAP, LMWH, LE duplex, negative 4/8 ?Foley - d/c 4/5, replaced 4/8 for retention, ToV 4/14, BSC, having difficulty with voiding in bed ?Dispo - 4NP, PT/OT/SLP, anticipate LTAC, awaiting insurance ?  ? ?Cassidy Oka, MD ?Trauma & General Surgery ?Please use [email or web address] to contact on call provider ? ?06/11/2021 ? ?*Care during the described time interval was provided by me. I have reviewed this patient's available data, including medical history, events of note, physical examination and test results as part of my evaluation. ? ? ? ?

## 2021-06-12 LAB — BASIC METABOLIC PANEL
Anion gap: 6 (ref 5–15)
BUN: 7 mg/dL (ref 6–20)
CO2: 32 mmol/L (ref 22–32)
Calcium: 8.7 mg/dL — ABNORMAL LOW (ref 8.9–10.3)
Chloride: 97 mmol/L — ABNORMAL LOW (ref 98–111)
Creatinine, Ser: 0.56 mg/dL (ref 0.44–1.00)
GFR, Estimated: >60 mL/min (ref >60)
Glucose, Bld: 112 mg/dL — ABNORMAL HIGH (ref 70–99)
Potassium: 4 mmol/L (ref 3.5–5.1)
Sodium: 135 mmol/L (ref 135–145)

## 2021-06-12 LAB — GLUCOSE, CAPILLARY
Glucose-Capillary: 114 mg/dL — ABNORMAL HIGH (ref 70–99)
Glucose-Capillary: 122 mg/dL — ABNORMAL HIGH (ref 70–99)
Glucose-Capillary: 122 mg/dL — ABNORMAL HIGH (ref 70–99)
Glucose-Capillary: 138 mg/dL — ABNORMAL HIGH (ref 70–99)
Glucose-Capillary: 102 mg/dL — ABNORMAL HIGH (ref 70–99)

## 2021-06-12 LAB — CBC
HCT: 26.4 % — ABNORMAL LOW (ref 36.0–46.0)
Hemoglobin: 8.5 g/dL — ABNORMAL LOW (ref 12.0–15.0)
MCH: 28.9 pg (ref 26.0–34.0)
MCHC: 32.2 g/dL (ref 30.0–36.0)
MCV: 89.8 fL (ref 80.0–100.0)
Platelets: 456 10*3/uL — ABNORMAL HIGH (ref 150–400)
RBC: 2.94 MIL/uL — ABNORMAL LOW (ref 3.87–5.11)
RDW: 14.4 % (ref 11.5–15.5)
WBC: 20 10*3/uL — ABNORMAL HIGH (ref 4.0–10.5)
nRBC: 0 % (ref 0.0–0.2)

## 2021-06-12 NOTE — Progress Notes (Signed)
RT called to patient's room due to trach being out.  RT found trach partially out but not able to be advanced in, good color change on ETCO2, but sats in the mid 80's.  RT removed inner cannula, inserted the obturator, and re-advanced the trach into place. Good color change on a new ETCO2 detector, bilateral BS, sats improved to 94%, patient tolerated well and is able to speak, RN and family at bedside. ?

## 2021-06-12 NOTE — Progress Notes (Addendum)
RT note. ?RT at bedside for trach check. Upon arrival patient sat 83% and trach was noticed to be out. RT placed trach back in place, confirmed with etco2, sats back up to 94%. IC assessed & clean, patient placed on 40% ATC. RN at bedside/aware. RT will continue to monitor.  ? ? ?Message sent secure chat to Dr. Bobbye Morton.  ?

## 2021-06-12 NOTE — Progress Notes (Signed)
Per Dr. Bobbye Morton instruction over secure chat, ?If patient removes trach again, we will trial leaving it out and use [place] for oxygenation. If patient doesn't tolerate, trach needs to be replaced.  ?

## 2021-06-12 NOTE — Progress Notes (Signed)
? ?Trauma/Critical Care Follow Up Note ? ?Subjective:  ?  ?Overnight Issues:  ? ?Objective:  ?Vital signs for last 24 hours: ?Temp:  [98 ?F (36.7 ?C)-98.8 ?F (37.1 ?C)] 98.2 ?F (36.8 ?C) (04/16 0827) ?Pulse Rate:  [100-121] 100 (04/16 0827) ?Resp:  [18-24] 22 (04/16 0827) ?BP: (129-164)/(59-77) 133/64 (04/16 0827) ?SpO2:  [90 %-96 %] 95 % (04/16 0827) ?FiO2 (%):  [40 %-60 %] 40 % (04/16 0817) ?Weight:  [86.7 kg] 86.7 kg (04/16 0500) ? ?Hemodynamic parameters for last 24 hours: ?  ? ?Intake/Output from previous day: ?04/15 0701 - 04/16 0700 ?In: 0263 [P.O.:375; I.V.:10; NG/GT:780] ?Out: 1400 [Urine:1400]  ?Intake/Output this shift: ?No intake/output data recorded. ? ?Vent settings for last 24 hours: ?FiO2 (%):  [40 %-60 %] 40 % ? ?Physical Exam:  ?Gen: comfortable, no distress ?Neuro: non-focal exam ?HEENT: PERRL ?Neck: supple ?CV: RRR ?Pulm: unlabored breathing ?Abd: soft, NT ?GU: clear yellow urine, spont voids ?Extr: wwp, no edema ? ? ?Results for orders placed or performed during the hospital encounter of 05/05/21 (from the past 24 hour(s))  ?Glucose, capillary     Status: Abnormal  ? Collection Time: 06/11/21 11:40 AM  ?Result Value Ref Range  ? Glucose-Capillary 121 (H) 70 - 99 mg/dL  ?Glucose, capillary     Status: Abnormal  ? Collection Time: 06/11/21  4:27 PM  ?Result Value Ref Range  ? Glucose-Capillary 102 (H) 70 - 99 mg/dL  ?Glucose, capillary     Status: Abnormal  ? Collection Time: 06/11/21  8:24 PM  ?Result Value Ref Range  ? Glucose-Capillary 116 (H) 70 - 99 mg/dL  ?Glucose, capillary     Status: Abnormal  ? Collection Time: 06/11/21 11:35 PM  ?Result Value Ref Range  ? Glucose-Capillary 120 (H) 70 - 99 mg/dL  ?Glucose, capillary     Status: Abnormal  ? Collection Time: 06/12/21  3:07 AM  ?Result Value Ref Range  ? Glucose-Capillary 114 (H) 70 - 99 mg/dL  ?Basic metabolic panel     Status: Abnormal  ? Collection Time: 06/12/21  3:15 AM  ?Result Value Ref Range  ? Sodium 135 135 - 145 mmol/L  ?  Potassium 4.0 3.5 - 5.1 mmol/L  ? Chloride 97 (L) 98 - 111 mmol/L  ? CO2 32 22 - 32 mmol/L  ? Glucose, Bld 112 (H) 70 - 99 mg/dL  ? BUN 7 6 - 20 mg/dL  ? Creatinine, Ser 0.56 0.44 - 1.00 mg/dL  ? Calcium 8.7 (L) 8.9 - 10.3 mg/dL  ? GFR, Estimated >60 >60 mL/min  ? Anion gap 6 5 - 15  ?CBC     Status: Abnormal  ? Collection Time: 06/12/21  3:15 AM  ?Result Value Ref Range  ? WBC 20.0 (H) 4.0 - 10.5 K/uL  ? RBC 2.94 (L) 3.87 - 5.11 MIL/uL  ? Hemoglobin 8.5 (L) 12.0 - 15.0 g/dL  ? HCT 26.4 (L) 36.0 - 46.0 %  ? MCV 89.8 80.0 - 100.0 fL  ? MCH 28.9 26.0 - 34.0 pg  ? MCHC 32.2 30.0 - 36.0 g/dL  ? RDW 14.4 11.5 - 15.5 %  ? Platelets 456 (H) 150 - 400 K/uL  ? nRBC 0.0 0.0 - 0.2 %  ?Glucose, capillary     Status: Abnormal  ? Collection Time: 06/12/21  8:26 AM  ?Result Value Ref Range  ? Glucose-Capillary 122 (H) 70 - 99 mg/dL  ? ? ?Assessment & Plan: ? ?Present on Admission: ? Neuroendocrine carcinoma ([place]) ? ? ?  LOS: 38 days  ? ?Additional comments:I reviewed the patient's new clinical lab test results.   and I reviewed the patients new imaging test results.   ? ?Neuroendocrine tumor on colonoscopy - s/p exlap, R hemicolectomy, cholecystectomy 3/9. Well differentiated neuroendocrine tumor with 1/7 +nodes. D/w Dr. Marin Olp 3/15 will likely need adjuvant chemo. Staples out 3/24. Bottom 2cm of wound granulating ?Respiratory failure 2/2 volume overload/?PNA - CT chest 3/11 and 3/13 with no PE. Intubated 3/16, suspect PNA based on CXR but negative resp cx and BAL, s/p 7d empiric merrem. Repeat pulm c/s 4/1, brovana, yupelri, singulair added. Trach 4/4, downsize to [facility] 4/11, 4CL today ?Volume overload - seems euvolemic, lasix BID, goal even to 500 neg/24h, transition to PO 4/8. Echo 3/28, essentially normal.  ?Altered mental status - CT head 3/13, 3/16/ 3/17 normal (h/o VP shunt). Empiric cefepime for aspiration, 5d course, end 4/12. Resp cx today. Ucx 3/13 negative. Bcx 3/15 NGTD. LP 3/16 NGTD. CT A/P 3/16 negative. 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 Neurology on board, changed to keppra, cont EEG without sz, encephalopathy resolved ?Anxiety - on valium, drop to 4q6 4/15, 12.5TID seroquel HS only ?Diarrhea - improved, restarted imodium, increase cholestyramine today ?vWD - humate P per Heme, end 3/12, stable ?Transaminitis - likely 2/2 TPN, now d/c'd ?Vomiting - seems post-tussive, much improved with CL trach, downsize to 4CL 4/14, tol full feed and cleared for FLD ?Leukocytosis, fever - 20 this AM, prior infectious workup unrevealing, curbside ID consult 3/28 with no add'l recs, LE duplex negative 4/8, CXR 4/8 unchanged, 150 diflucan for vaginal yeast infection concerns given 4/7, UA 4/8 negative ?FEN - NPO, OGT, tol TF at 45, incr to goal (65), abd pain improved when binder is loose, XR abd today unremarkable ?ABLA - stable ?DVT - SCDs, s/p DDVAP, LMWH, LE duplex, negative 4/8 ?Foley - d/c 4/5, replaced 4/8 for retention, ToV 4/14, BSC, having difficulty with voiding in bed ?Dispo - 4NP, PT/OT/SLP, anticipate LTAC, awaiting insurance ?  ? ?[name], MD ?Trauma & General Surgery ?Please use [email or web address] to contact on call provider ? ?06/12/2021 ? ?*Care during the described time interval was provided by me. I have reviewed this patient's available data, including medical history, events of note, physical examination and test results as part of my evaluation. ? ? ? ?

## 2021-06-12 NOTE — Progress Notes (Signed)
Patients trach came out. Cassidy Tran was reinserted without difficulty. Positive color changed with capnography. Patient did have desaturation while trach was out but Sp02 recovered to 92-95% once trach was reinserted.  ?

## 2021-06-13 LAB — FUNGUS CULTURE RESULT

## 2021-06-13 LAB — GLUCOSE, CAPILLARY
Glucose-Capillary: 107 mg/dL — ABNORMAL HIGH (ref 70–99)
Glucose-Capillary: 112 mg/dL — ABNORMAL HIGH (ref 70–99)
Glucose-Capillary: 117 mg/dL — ABNORMAL HIGH (ref 70–99)
Glucose-Capillary: 122 mg/dL — ABNORMAL HIGH (ref 70–99)
Glucose-Capillary: 124 mg/dL — ABNORMAL HIGH (ref 70–99)
Glucose-Capillary: 134 mg/dL — ABNORMAL HIGH (ref 70–99)
Glucose-Capillary: 91 mg/dL (ref 70–99)

## 2021-06-13 LAB — FUNGUS CULTURE WITH STAIN

## 2021-06-13 LAB — CBC
HCT: 26.8 % — ABNORMAL LOW (ref 36.0–46.0)
Hemoglobin: 8.6 g/dL — ABNORMAL LOW (ref 12.0–15.0)
MCH: 28.9 pg (ref 26.0–34.0)
MCHC: 32.1 g/dL (ref 30.0–36.0)
MCV: 89.9 fL (ref 80.0–100.0)
Platelets: 415 10*3/uL — ABNORMAL HIGH (ref 150–400)
RBC: 2.98 MIL/uL — ABNORMAL LOW (ref 3.87–5.11)
RDW: 14.6 % (ref 11.5–15.5)
WBC: 13.4 10*3/uL — ABNORMAL HIGH (ref 4.0–10.5)
nRBC: 0 % (ref 0.0–0.2)

## 2021-06-13 LAB — FUNGAL ORGANISM REFLEX

## 2021-06-13 LAB — BASIC METABOLIC PANEL
Anion gap: 7 (ref 5–15)
BUN: 6 mg/dL (ref 6–20)
CO2: 32 mmol/L (ref 22–32)
Calcium: 8.8 mg/dL — ABNORMAL LOW (ref 8.9–10.3)
Chloride: 99 mmol/L (ref 98–111)
Creatinine, Ser: 0.51 mg/dL (ref 0.44–1.00)
GFR, Estimated: >60 mL/min (ref >60)
Glucose, Bld: 116 mg/dL — ABNORMAL HIGH (ref 70–99)
Potassium: 4 mmol/L (ref 3.5–5.1)
Sodium: 138 mmol/L (ref 135–145)

## 2021-06-13 MED ORDER — OSMOLITE 1.5 CAL PO LIQD: 1000.0000 mL | ORAL | Status: DC

## 2021-06-13 MED ORDER — DIAZEPAM 2 MG PO TABS
4.0000 mg | ORAL_TABLET | Freq: Three times a day (TID) | ORAL | Status: DC
  Administered 2021-06-14 – 2021-06-15 (×5): 4 mg via ORAL
  Filled 2021-06-13 (×5): qty 2

## 2021-06-13 MED ORDER — MIDAZOLAM HCL 2 MG/2ML IJ SOLN
0.5000 mg | Freq: Three times a day (TID) | INTRAMUSCULAR | Status: DC | PRN
  Administered 2021-06-14 – 2021-06-15 (×2): 0.5 mg via INTRAVENOUS
  Filled 2021-06-13 (×2): qty 2

## 2021-06-13 MED ORDER — OSMOLITE 1.5 CAL PO LIQD
1000.0000 mL | ORAL | Status: DC
  Administered 2021-06-13 – 2021-06-14 (×2): 1000 mL

## 2021-06-13 MED ORDER — BOOST / RESOURCE BREEZE PO LIQD CUSTOM
1.0000 | Freq: Three times a day (TID) | ORAL | Status: DC
  Administered 2021-06-13 (×2): 1 via ORAL

## 2021-06-13 MED ORDER — PROSOURCE TF PO LIQD
45.0000 mL | Freq: Two times a day (BID) | ORAL | Status: DC
  Administered 2021-06-13 – 2021-06-15 (×4): 45 mL
  Filled 2021-06-13 (×4): qty 45

## 2021-06-13 NOTE — Progress Notes (Signed)
Brief Nutrition Note ? ?SLP contacted RD regarding adjustment of tube feeding regimen as pt's diet was advanced to Regular textures with thin liquids this morning. Per SLP, pt reports not eating much due to feeling full. Reached out to Dr. Bobbye Morton regarding transition to nocturnal tube feeds given previous concern that pt may not tolerate the increased tube feeding rate required to meet nutritional needs for nocturnal feeds due to ongoing N/V. Dr. Bobbye Morton in agreement with trial of nocturnal tube feeds over 14 hours and for tube feeds to remain at same rate of 65 ml/hr. Discussed with RN via phone call. ? ?Spoke with pt and family at bedside. RN in room providing nursing care. Pt sleepy at time of RD visit. Explained plan to transition to nocturnal tube feeds over 14 hours from 6:00 pm to 8:00 am. Pt and family in agreement with plan. Pt has not had any vomiting for a few days and has been tolerating Osmolite 1.5 tube feeds at goal rate of 65 ml/hr. Diarrhea improved. Pt would like to try non-milky oral nutrition supplements. Will order Boost Breeze TID between meals. Pt does not eat or drink dairy products. Dislikes added to [place]. ? ?RD to adjust tube feeding regimen: ?- Osmolite 1.5 @ 65 ml/hr x 14 hours from 1800 to 0600 (total of 910 ml) via PEG ?- ProSource TF 45 ml BID ? ?Nocturnal tube feeding regimen provides 1445 kcal, 79 grams of protein, and 693 ml of H2O (meets 66% of minimum kcal needs and 72% of minimum protein needs). ? ?Will also order oral nutrition supplements: ?- Boost Breeze po TID, each supplement provides 250 kcal and 9 grams of protein ? ?RD to follow up with pt and family tomorrow, 06/14/21. Pt and family aware of plan. ? ? ?Gustavus Bryant, MS, RD, LDN ?Inpatient Clinical Dietitian ?Please see AMiON for contact information. ? ? ?

## 2021-06-13 NOTE — TOC Progression Note (Signed)
Transition of Care (TOC) - Progression Note  ? ? ?Patient Details  ?Name: Cassidy Tran ?MRN: [id number] ?Date of Birth: 09/09/1975 ? ?Transition of Care (TOC) CM/SW Contact  ?Angelita Ingles, RN ?Phone Number:[phone] ? ?06/13/2021, 1:33 PM ? ?Clinical Narrative:    ?TOC continues to follow for possible LTACH  placement. Per Arley [place] hospital liaison at [facility] the peer to peer process has been completed and we are still waiting a determination to see if patient will be able to go to [facility].  ? ? ?Expected Discharge Plan: [place] (LTAC) ?Barriers to Discharge: Continued Medical Work up ? ?Expected Discharge Plan and Services ?Expected Discharge Plan: [place] (LTAC) ?  ?Discharge Planning Services: CM Consult ?Post Acute Care Choice: Long Term Acute Care (LTAC) ?Living arrangements for the past 2 months: [place] ?                ?  ?  ?  ?  ?  ?  ?  ?  ?  ?  ? ? ?Social Determinants of Health (SDOH) Interventions ?  ? ?Readmission Risk Interventions ?   ? View : No data to display.  ?  ?  ?  ? ? ?

## 2021-06-13 NOTE — Progress Notes (Signed)
Inpatient Rehab Admissions Coordinator:  ? ?Per therapy recommendations,  patient was screened for CIR candidacy by Fathima Bartl, MS, CCC-SLP. At this time, Pt. Appears to be a a potential candidate for CIR. I will place   order for rehab consult per protocol for full assessment. Please contact me any with questions. ? ?Nashid Pellum, MS, CCC-SLP ?Rehab Admissions Coordinator  ?[phone] (celll) ?[phone] (office) ? ?

## 2021-06-13 NOTE — Progress Notes (Signed)
? ?General Surgery Follow Up Note ? ?Subjective:  ?  ?Overnight Issues:  ? ?Objective:  ?Vital signs for last 24 hours: ?Temp:  [97.6 ?F (36.4 ?C)-98.8 ?F (37.1 ?C)] 98.8 ?F (37.1 ?C) (04/17 1500) ?Pulse Rate:  [99-113] 108 (04/17 1703) ?Resp:  [16-25] 25 (04/17 1703) ?BP: (123-144)/(43-76) 144/65 (04/17 1500) ?SpO2:  [94 %-100 %] 100 % (04/17 1349) ?FiO2 (%):  [28 %-40 %] 28 % (04/17 0817) ?Weight:  [88.5 kg] 88.5 kg (04/17 0444) ? ?Hemodynamic parameters for last 24 hours: ?  ? ?Intake/Output from previous day: ?04/16 0701 - 04/17 0700 ?In: 1 [I.V.:1] ?Out: -   ?Intake/Output this shift: ?Total I/O ?In: 240 [P.O.:240] ?Out: -  ? ?Vent settings for last 24 hours: ?FiO2 (%):  [28 %-40 %] 28 % ? ?Physical Exam:  ?Gen: comfortable, no distress ?Neuro: non-focal exam ?HEENT: PERRL ?Neck: supple ?CV: RRR ?Pulm: unlabored breathing ?Abd: soft, NT ?GU: clear yellow urine ?Extr: wwp, no edema ? ? ?Results for orders placed or performed during the hospital encounter of 05/05/21 (from the past 24 hour(s))  ?Glucose, capillary     Status: Abnormal  ? Collection Time: 06/12/21  7:39 PM  ?Result Value Ref Range  ? Glucose-Capillary 102 (H) 70 - 99 mg/dL  ?Glucose, capillary     Status: Abnormal  ? Collection Time: 06/13/21 12:53 AM  ?Result Value Ref Range  ? Glucose-Capillary 107 (H) 70 - 99 mg/dL  ?Glucose, capillary     Status: Abnormal  ? Collection Time: 06/13/21  3:42 AM  ?Result Value Ref Range  ? Glucose-Capillary 122 (H) 70 - 99 mg/dL  ?Basic metabolic panel     Status: Abnormal  ? Collection Time: 06/13/21  4:00 AM  ?Result Value Ref Range  ? Sodium 138 135 - 145 mmol/L  ? Potassium 4.0 3.5 - 5.1 mmol/L  ? Chloride 99 98 - 111 mmol/L  ? CO2 32 22 - 32 mmol/L  ? Glucose, Bld 116 (H) 70 - 99 mg/dL  ? BUN 6 6 - 20 mg/dL  ? Creatinine, Ser 0.51 0.44 - 1.00 mg/dL  ? Calcium 8.8 (L) 8.9 - 10.3 mg/dL  ? GFR, Estimated >60 >60 mL/min  ? Anion gap 7 5 - 15  ?CBC     Status: Abnormal  ? Collection Time: 06/13/21  4:00 AM   ?Result Value Ref Range  ? WBC 13.4 (H) 4.0 - 10.5 K/uL  ? RBC 2.98 (L) 3.87 - 5.11 MIL/uL  ? Hemoglobin 8.6 (L) 12.0 - 15.0 g/dL  ? HCT 26.8 (L) 36.0 - 46.0 %  ? MCV 89.9 80.0 - 100.0 fL  ? MCH 28.9 26.0 - 34.0 pg  ? MCHC 32.1 30.0 - 36.0 g/dL  ? RDW 14.6 11.5 - 15.5 %  ? Platelets 415 (H) 150 - 400 K/uL  ? nRBC 0.0 0.0 - 0.2 %  ?Glucose, capillary     Status: Abnormal  ? Collection Time: 06/13/21  7:22 AM  ?Result Value Ref Range  ? Glucose-Capillary 134 (H) 70 - 99 mg/dL  ?Glucose, capillary     Status: Abnormal  ? Collection Time: 06/13/21 11:10 AM  ?Result Value Ref Range  ? Glucose-Capillary 112 (H) 70 - 99 mg/dL  ? ? ?Assessment & Plan: ? ?Present on Admission: ? Neuroendocrine carcinoma ([place]) ? ? ? LOS: 39 days  ? ?Additional comments:I reviewed the patient's new clinical lab test results.   and I reviewed the patients new imaging test results.   ? ?Neuroendocrine tumor on  colonoscopy - s/p exlap, R hemicolectomy, cholecystectomy 3/9. Well differentiated neuroendocrine tumor with 1/7 +nodes. D/w Dr. Marin Olp 3/15 will likely need adjuvant chemo. Staples out 3/24. Bottom 2cm of wound granulating ?Respiratory failure 2/2 volume overload/?PNA - CT chest 3/11 and 3/13 with no PE. Intubated 3/16, suspect PNA based on CXR but negative resp cx and BAL, s/p 7d empiric merrem. Repeat pulm c/s 4/1, brovana, yupelri, singulair added. Trach 4/4, downsize to [facility] 4/11, now in 4CL, cap today  ?Volume overload - seems euvolemic, lasix BID, goal even to 500 neg/24h, transition to PO 4/8. Echo 3/28, essentially normal.  ?Altered mental status - CT head 3/13, 3/16/ 3/17 normal (h/o VP shunt). Empiric cefepime for aspiration, 5d course, end 4/12. Resp cx today. Ucx 3/13 negative. Bcx 3/15 NGTD. LP 3/16 NGTD. CT A/P 3/16 negative. 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 Neurology on board, changed to keppra, cont EEG without sz, encephalopathy resolved ?Anxiety - on valium, drop to 4q6 4/15, drop to 4q8 4/18,  12.5TID seroquel HS only ?Diarrhea - improved, restarted imodium, increased cholestyramine 4/16 ?vWD - humate P per Heme, end 3/12, stable ?Transaminitis - likely 2/2 TPN, now d/c'd ?Vomiting - seems post-tussive, much improved with CL trach, downsize to 4CL 4/14, tol full feed and cleared for FLD ?Leukocytosis, fever - 20 this AM, prior infectious workup unrevealing, curbside ID consult 3/28 with no add'l recs, LE duplex negative 4/8, CXR 4/8 unchanged, 150 diflucan for vaginal yeast infection concerns given 4/7, UA 4/8 negative ?FEN - NPO, OGT, tol TF at 65, change to 14-16h feeds, abd pain improved when binder is loose ?ABLA - stable ?DVT - SCDs, s/p DDVAP, LMWH, LE duplex, negative 4/8 ?Foley - d/c 4/5, replaced 4/8 for retention, ToV 4/14, BSC, having difficulty with voiding in bed ?Dispo - 4NP, PT/OT/SLP, anticipate LTAC, awaiting insurance ?  ? ?[name], MD ?Trauma & General Surgery ?Please use [email or web address] to contact on call provider ? ?06/13/2021 ? ?*Care during the described time interval was provided by me. I have reviewed this patient's available data, including medical history, events of note, physical examination and test results as part of my evaluation. ? ?

## 2021-06-13 NOTE — Progress Notes (Signed)
Speech Language Pathology Treatment: Dysphagia;Passy Muir Speaking valve  ?Patient Details ?Name: Cassidy Tran ?MRN: [id number] ?DOB: 05/11/1975 ?Today's Date: 06/13/2021 ?Time: 3546-5681 ?SLP Time Calculation (min) (ACUTE ONLY): 20 min ? ?Assessment / Plan / Recommendation ?Clinical Impression ? Pt was seen for treatment with her parents present throughout the session and her husband present at the beginning. Pt reported that she has been tolerating meals without difficulty swallowing, and is now willing to attempt advanced solids. Pt tolerated thin liquids via straw and small boluses of regular textures without symptoms of oropharyngeal dysphagia. Pt tolerated PMSV for the entirety of the session with vitals ranging RR 24-29, SpO2 94-95, and HR 112-114. Pt's family stated that her voice is approximately 90% back to baseline. Respiratory support for speech was mildly reduced, but vocal intensity was only mildly impaired and speech was adequately intelligible. PMSV may continue to be used during meals and may be used during all therapies with full supervision. Her diet will be advanced to regular texture solids and thin liquids. Pt reported not eating much since she feels full most of the time. Nunzio Cory, RD was contacted regarding there being any potential for adjusting TFs; SLP was advised that, following discussing with MD, nocturnal TFs will be attempted. SLP will continue to follow pt.   ?  ?HPI HPI: Pt is a 46 y.o. female who presented to [facility] hospital on 05/05/2021 and underwent ex-lap with cholecystectomy and R hemicolectomy for neuroendocrine tumor. Pt developed respiratory failure due to volume overload and required intubation. Pt developed AMS; underwent lumbar punture 3/16 which was negative.  ETT 3/16-trach 4/3. Cortrak 3/15, G-tube 4/3. PMH: ASCUS, clotting disorder, seizures, PNA, Von Willebrand disease, VP shunt placement. Trach changed to #6 cuffless on 4/11. MBS on 4/11 with swallow function appearing  WFL and without aspiration or penetration, full liquids (thin) recommended. MD increased tube feeding amount on 4/12. ?  ?   ?SLP Plan ? Continue with current plan of care ? ?  ?  ?Recommendations for follow up therapy are one component of a multi-disciplinary discharge planning process, led by the attending physician.  Recommendations may be updated based on patient status, additional functional criteria and insurance authorization. ?  ? ?Recommendations  ?Diet recommendations: Regular;Thin liquid ?Liquids provided via: Cup;Straw ?Medication Administration:  (via G-tube or whole with puree) ?Supervision: Patient able to self feed ?Compensations:  (PMV in place) ?Postural Changes and/or Swallow Maneuvers: Seated upright 90 degrees  ?   ? Patient may use Passy-Muir Speech Valve: During all therapies with supervision ?PMSV Supervision: Full  ?   ? ? ? ? Oral Care Recommendations: Oral care BID ?Follow Up Recommendations: Acute inpatient rehab (3hours/day) ?Assistance recommended at discharge: Intermittent Supervision/Assistance ?SLP Visit Diagnosis: Aphonia (R49.1);Dysphagia, unspecified (R13.10) ?Plan: Continue with current plan of care ? ? ? ? ?  ?  ?Kostantinos Tallman I. Hardin Negus, [place], CCC-SLP ?Acute Rehabilitation Services ?Office number [phone] ?Pager [phone] ? ? ?Cassidy Tran ? ?06/13/2021, 11:07 AM ? ? ? ?

## 2021-06-13 NOTE — Progress Notes (Signed)
Physical Therapy Treatment ?Patient Details ?Name: Cassidy Tran ?MRN: [id number] ?DOB: Aug 07, 1975 ?Today's Date: 06/13/2021 ? ? ?History of Present Illness 45 y.o. female presents to [facility] hospital on 05/05/2021 and underwent ex-lap with cholecystectomy and R hemicolectomy for neuroendocrine tumor. Pt developed respiratory failure due to volume overload and required intubation on 3/16. Pt developed AMS during admission with rising white cell counts, underwent lumbar punture on 3/16. Pt underwent bronchoscopy on 3/21. Bronchoscopy 4/1, tracheostomy 4/3. PMH includes ASCUS, clotting disorder, seizures, PNA, Von Willebrand disease, VP shunt placement. ? ?  ?PT Comments  ? ? Pt. Greeted in bed asking to use the [facility]. She was impulsive during the session making quick moves and requiring close guard during mobility. She requires Min A for bed mobility to pull trunk up and for step pivot transfers. She also required min A for short distance gait, 12 feet with 2+ HHA. Pt was on 10L 60% FiO2 during all mobility and desats to low 80s, she was able to recover after 2-3 min of supine rest and turned back to 8L. Pt would benefit from continued PT services to maximize independence by working on her functional transfer, gait, balance, and endurance. Plan and d/c recommendations remain unchanged. PT to follow up acutely as able.  ?  ?Recommendations for follow up therapy are one component of a multi-disciplinary discharge planning process, led by the attending physician.  Recommendations may be updated based on patient status, additional functional criteria and insurance authorization. ? ?Follow Up Recommendations ? Acute inpatient rehab (3hours/day) ?  ?  ?Assistance Recommended at Discharge Frequent or constant Supervision/Assistance  ?Patient can return home with the following Assistance with cooking/housework;Assistance with feeding;Direct supervision/assist for medications management;Direct supervision/assist for financial  management;Assist for transportation;Help with stairs or ramp for entrance;A lot of help with walking and/or transfers;A lot of help with bathing/dressing/bathroom ?  ?Equipment Recommendations ? Rolling walker (2 wheels);Wheelchair (measurements PT);Wheelchair cushion (measurements PT);Hospital bed  ?  ?Recommendations for Other Services   ? ? ?  ?Precautions / Restrictions Precautions ?Precautions: Fall ?Precaution Comments: trach collar, watch HR ?Restrictions ?Weight Bearing Restrictions: No  ?  ? ?Mobility ? Bed Mobility ?Overal bed mobility: Needs Assistance ?Bed Mobility: Sidelying to Sit, Supine to Sit ?  ?  ?Supine to sit: Min assist ?Sit to supine: Min guard ?  ?General bed mobility comments: Min A for trunk support and LE cuing to sit at EOB, pt able to go from EOB to supine min guard for saftey, max a for scoot up in bed ?Patient Response: Impulsive ? ?Transfers ?Overall transfer level: Needs assistance ?Equipment used: 1 person hand held assist ?Transfers: Bed to chair/wheelchair/BSC, Sit to/from Stand ?Sit to Stand: Min guard ?  ?Step pivot transfers: Min assist ?  ?  ?  ?General transfer comment: STS x3, HH A for step pivot transfer, min A for transfer from [facility] for powerup with RW. Pt shows poor eccentric control on STS lower ?  ? ?Ambulation/Gait ?Ambulation/Gait assistance: Min assist ?Gait Distance (Feet): 12 Feet ?Assistive device: 2 person hand held assist ?Gait Pattern/deviations: Step-to pattern ?Gait velocity: reduced ?  ?  ?General Gait Details: Pt with slowed gait and reliant on PT/SPT hand for balance, required one seated rest break 2* lightheadedness. ? ? ?Stairs ?  ?  ?  ?  ?  ? ? ?Wheelchair Mobility ?  ? ?Modified Rankin (Stroke Patients Only) ?  ? ? ?  ?Balance Overall balance assessment: Needs assistance ?Sitting-balance support: Feet supported ?Sitting balance-Leahy Scale:  Fair ?Sitting balance - Comments: Able to sit on BSC and on edge of recliner w/o difficulty. ?  ?Standing  balance support: Bilateral upper extremity supported, During functional activity ?Standing balance-Leahy Scale: Poor ?Standing balance comment: requires HHA min A for steady ?  ?  ?  ?  ?  ?  ?  ?  ?  ?  ?  ?  ? ?  ?Cognition Arousal/Alertness: Awake/alert ?Behavior During Therapy: [facility] for tasks assessed/performed ?Overall Cognitive Status: Impaired/Different from baseline ?Area of Impairment: Attention, Following commands, Awareness, Problem solving, Safety/judgement ?  ?  ?  ?  ?  ?  ?  ?  ?  ?Current Attention Level: Selective ?  ?Following Commands: Follows one step commands consistently, Follows multi-step commands with increased time ?Safety/Judgement: Decreased awareness of deficits ?Awareness: Emergent ?Problem Solving: Slow processing ?General Comments: Pt. able to express needs well and speak using PMV. Slightly impulsive when she had to get up to go to the bathroom ?  ?  ? ?  ?Exercises   ? ?  ?General Comments General comments (skin integrity, edema, etc.): pt on 10L trach collar, 60% FiO2, desated to low 80s with ambulation and requires 2-3 minute rest break supine to recover. Complains of being lightheaded during functional movement, BP was stable throughout. ?  ?  ? ?Pertinent Vitals/Pain Pain Assessment ?Pain Assessment: No/denies pain  ? ? ?Home Living   ?  ?  ?  ?  ?  ?  ?  ?  ?  ?   ?  ?Prior Function    ?  ?  ?   ? ?PT Goals (current goals can now be found in the care plan section) Acute Rehab PT Goals ?Patient Stated Goal: to improve strength and reduce caregiver burden ?PT Goal Formulation: With patient/family ?Time For Goal Achievement: 06/15/21 ?Potential to Achieve Goals: Fair ?Progress towards PT goals: Progressing toward goals ? ?  ?Frequency ? ? ? Min 4X/week ? ? ? ?  ?PT Plan Current plan remains appropriate  ? ? ?Co-evaluation   ?  ?  ?  ?  ? ?  ?AM-PAC PT "6 Clicks" Mobility   ?Outcome Measure ? Help needed turning from your back to your side while in a flat bed without using  bedrails?: A Little ?Help needed moving from lying on your back to sitting on the side of a flat bed without using bedrails?: A Little ?Help needed moving to and from a bed to a chair (including a wheelchair)?: A Little ?Help needed standing up from a chair using your arms (e.g., wheelchair or bedside chair)?: A Little ?Help needed to walk in hospital room?: A Lot ?Help needed climbing 3-5 steps with a railing? : Total ?6 Click Score: 15 ? ?  ?End of Session Equipment Utilized During Treatment: Oxygen ?Activity Tolerance: Patient limited by fatigue ?Patient left: in bed;with call bell/phone within reach;with bed alarm set ?Nurse Communication: Mobility status ?PT Visit Diagnosis: Muscle weakness (generalized) (M62.81);Pain;Other abnormalities of gait and mobility (R26.89) ?  ? ? ?Time: 1829-9371 ?PT Time Calculation (min) (ACUTE ONLY): 23 min ? ?Charges:  $Therapeutic Activity: 23-37 mins          ?          ? ?Thermon Leyland, SPT ?Acute Rehab Services ? ? ? ?Thermon Leyland ?06/13/2021, 2:18 PM ? ?

## 2021-06-13 NOTE — Progress Notes (Signed)
Occupational Therapy Treatment ?Patient Details ?Name: Cassidy Tran ?MRN: [id number] ?DOB: Dec 27, 1975 ?Today's Date: 06/13/2021 ? ? ?History of present illness 46 y.o. female presents to [facility] hospital on 05/05/2021 and underwent ex-lap with cholecystectomy and R hemicolectomy for neuroendocrine tumor. Pt developed respiratory failure due to volume overload and required intubation on 3/16. Pt developed AMS during admission with rising white cell counts, underwent lumbar punture on 3/16. Pt underwent bronchoscopy on 3/21. Bronchoscopy 4/1, tracheostomy 4/3. PMH includes ASCUS, clotting disorder, seizures, PNA, Von Willebrand disease, VP shunt placement. ?  ?OT comments ? Pt is making good progress towards her acute goals. Pt was preparing for [facility] transfer upon arrival. Overall she was able to complete pivotal stepping from the bed with [facility] assist and min A, RW used for second transfer also with min A for weakness and unsteady weight shifting. She required max A for hygiene from her husband as they are concerned about the skin integrity on her rear peri area; anticipate pt would assise but declined this date. Increased time required for all tasks for SpO2 recovery, O2 sat dropped to 86-88% during functional tasks, needs sitting rest breaks to recover. Pt continues to benefit from OT acutely, d/c remains appropriate.   ? ?Recommendations for follow up therapy are one component of a multi-disciplinary discharge planning process, led by the attending physician.  Recommendations may be updated based on patient status, additional functional criteria and insurance authorization. ?   ?Follow Up Recommendations ? Acute inpatient rehab (3hours/day)  ?  ?Assistance Recommended at Discharge Frequent or constant Supervision/Assistance  ?Patient can return home with the following ? A lot of help with bathing/dressing/bathroom;Assistance with cooking/housework;Assistance with feeding;Direct supervision/assist for medications  management;Help with stairs or ramp for entrance;Assist for transportation;Direct supervision/assist for financial management;A lot of help with walking and/or transfers ?  ?Equipment Recommendations ? BSC/3in1;Tub/shower seat  ?  ?Recommendations for Other Services   ? ?  ?Precautions / Restrictions Precautions ?Precautions: Fall ?Precaution Comments: trach collar, watch HR ?Restrictions ?Weight Bearing Restrictions: No  ? ? ?  ? ?Mobility Bed Mobility ?  ?  ?  ?  ?  ?  ?  ?General bed mobility comments: Pt sitting EOB upon arrival ?  ? ?Transfers ?Overall transfer level: Needs assistance ?Equipment used: Rolling walker (2 wheels), 1 person hand held assist ?Transfers: Sit to/from Stand, Bed to chair/wheelchair/BSC ?Sit to Stand: Min assist ?  ?  ?Step pivot transfers: Min assist ?  ?  ?General transfer comment: HH assist wtih min A for the inital pivotal transfer, min A for transfer from [facility] to chair with RW. Pt unstady and requires cues for sequencing and problem solving ?  ?  ?Balance Overall balance assessment: Needs assistance ?Sitting-balance support: Feet supported ?Sitting balance-Leahy Scale: Fair ?  ?  ?Standing balance support: Bilateral upper extremity supported, During functional activity ?Standing balance-Leahy Scale: Poor ?Standing balance comment: min A for standing balance ?  ?  ?  ?  ?  ?  ?  ?  ?  ?  ?  ?   ? ?ADL either performed or assessed with clinical judgement  ? ?ADL Overall ADL's : Needs assistance/impaired ?  ?  ?Grooming: Set up;Sitting ?Grooming Details (indicate cue type and reason): increased time and cues to initiate and sustain task ?  ?  ?  ?  ?  ?  ?  ?  ?Toilet Transfer: Minimal assistance;Stand-pivot;BSC/3in1;Rolling walker (2 wheels) ?Toilet Transfer Details (indicate cue type and reason): unsteady and requires cues  for sequencing ?Toileting- Clothing Manipulation and Hygiene: Maximal assistance;Sit to/from stand ?Toileting - Clothing Manipulation Details (indicate cue type  and reason): pt likely able to do more of the tasks, but husband completign for pt due to skin integrity impairment on rear peri area - pt standing with BUE supported while husband completes hygiene ?  ?  ?Functional mobility during ADLs: Minimal assistance;Rolling walker (2 wheels) ?General ADL Comments: SpO2 dropped to 86%, required sitting rest to recover to >90% ?  ? ?Extremity/Trunk Assessment Upper Extremity Assessment ?Upper Extremity Assessment: Generalized weakness ?RUE Deficits / Details: good over head shoulder flexion, globally 3+/5. weak grip strength but able to use R hand functioanlly for grooming ?RUE Sensation: WNL ?RUE Coordination: decreased fine motor;decreased gross motor ?LUE Deficits / Details: limited to ~90* shoulder flexion, question effort. weak grip strength. required incrased effort for management of RW with LUE ?LUE Sensation: WNL ?LUE Coordination: decreased fine motor;decreased gross motor ?  ?Lower Extremity Assessment ?Lower Extremity Assessment: Defer to PT evaluation ?  ?  ?  ? ?Vision   ?Vision Assessment?: No apparent visual deficits ?  ?Perception Perception ?Perception: Within Functional Limits ?  ?Praxis Praxis ?Praxis: Intact ?  ? ?Cognition Arousal/Alertness: Awake/alert ?Behavior During Therapy: [facility] for tasks assessed/performed ?Overall Cognitive Status: Impaired/Different from baseline ?Area of Impairment: Attention, Following commands, Awareness, Problem solving ?  ?  ?  ?  ?  ?  ?  ?  ?  ?Current Attention Level: Selective ?  ?Following Commands: Follows one step commands consistently, Follows multi-step commands with increased time ?Safety/Judgement: Decreased awareness of deficits ?Awareness: Emergent ?Problem Solving: Slow processing ?General Comments: pt continues with flat affect, more responsive to husband. required cues for problem solving and increased time for all tasks ?  ?  ?   ?Exercises   ? ?  ?Shoulder Instructions   ? ? ?  ?General Comments pt on 10L  trach collar, 60% FiO2. Pt desats to mid 80s with mobility but recovers within 2-3 minutes with seated rest break - pt reports she had a fall while in the hospital, and is now more anxious/timid for OOB tasks  ? ? ?Pertinent Vitals/ Pain       Pain Assessment ?Pain Assessment: No/denies pain ?Pain Intervention(s): Monitored during session ? ?Home Living   ?  ?  ?  ?  ?  ?  ?  ?  ?  ?  ?  ?  ?  ?  ?  ?  ?  ?  ? ?  ?Prior Functioning/Environment    ?  ?  ?  ?   ? ?Frequency ? Min 2X/week  ? ? ? ? ?  ?Progress Toward Goals ? ?OT Goals(current goals can now be found in the care plan section) ? Progress towards OT goals: Progressing toward goals ? ?Acute Rehab OT Goals ?Patient Stated Goal: remove trach ?OT Goal Formulation: With patient ?Time For Goal Achievement: 06/16/21 ?Potential to Achieve Goals: Good ?ADL Goals ?Pt Will Perform Grooming: with supervision;sitting ?Pt Will Perform Upper Body Dressing: with supervision;sitting ?Pt Will Perform Lower Body Dressing: with min assist;sitting/lateral leans ?Pt Will Transfer to Toilet: with min assist;stand pivot transfer;bedside commode ?Pt/caregiver will Perform Home Exercise Program: Increased strength;Both right and left upper extremity;With Supervision  ?Plan Discharge plan remains appropriate   ? ?Co-evaluation ? ? ?   ?  ?  ?  ?  ? ?  ?AM-PAC OT "6 Clicks" Daily Activity     ?Outcome Measure ? ?  Help from another person eating meals?: A Little ?Help from another person taking care of personal grooming?: A Little ?Help from another person toileting, which includes using toliet, bedpan, or urinal?: A Lot ?Help from another person bathing (including washing, rinsing, drying)?: A Lot ?Help from another person to put on and taking off regular upper body clothing?: A Little ?Help from another person to put on and taking off regular lower body clothing?: A Lot ?6 Click Score: 15 ? ?  ?End of Session Equipment Utilized During Treatment: Oxygen;Rolling walker (2 wheels)  (BSC) ? ?OT Visit Diagnosis: Unsteadiness on feet (R26.81);Muscle weakness (generalized) (M62.81);Dizziness and giddiness (R42);Pain ?  ?Activity Tolerance Patient tolerated treatment well ?  ?Patient Left in chair;with f

## 2021-06-13 NOTE — Progress Notes (Signed)
Patient's trach capped per order. Patient placed on [place] 5L with humidity, no increased WOB. Patient able to speak clearly blow air from her mouth when asked and take deep breathes comfortably.  RN notified.  RT will continue to monitor. ?

## 2021-06-14 LAB — GLUCOSE, CAPILLARY
Glucose-Capillary: 109 mg/dL — ABNORMAL HIGH (ref 70–99)
Glucose-Capillary: 134 mg/dL — ABNORMAL HIGH (ref 70–99)
Glucose-Capillary: 110 mg/dL — ABNORMAL HIGH (ref 70–99)
Glucose-Capillary: 106 mg/dL — ABNORMAL HIGH (ref 70–99)
Glucose-Capillary: 107 mg/dL — ABNORMAL HIGH (ref 70–99)
Glucose-Capillary: 121 mg/dL — ABNORMAL HIGH (ref 70–99)

## 2021-06-14 NOTE — PMR Pre-admission (Signed)
PMR Admission Coordinator Pre-Admission Assessment ? ?Patient: Cassidy Tran is an 46 y.o., female ?MRN: [id number] ?DOB: 04/18/1975 ?Height: 5\' 3"  (160 cm) ?Weight: 88.5 kg ? ?Insurance Information ?HMO:     PPO:      PCP:      IPA:      80/20:      OTHER:  ?PRIMARY: [employer]      Policy#: [id number]      Subscriber: pt ?CM Name: Elta Guadeloupe      Phone#: [phone]     Fax#: [phone] ?Pre-Cert#: [id number] approved for 5 days until 4/23 with updates due 4/24 to Dewitt Rota phone [phone] fax [phone]      Employer:  ?Benefits:  Phone #: [phone]     Name: 4/18 ?Eff. Date: 01/28/2019     Deduct: $3500      Out of Pocket Max: $6500      Life Max: none ?CIR: 80%      SNF: 80% 60 days ?Outpatient: $75 per visit     Co-Pay: visits per medical neccesity ?Home Health: 80%      Co-Pay: 20% ?DME: 50%     Co-Pay: 50% ?Providers: in network ? ?SECONDARY: none ? ?Financial Counselor:       Phone#:  ? ?The ?Data Collection Information Summary? for patients in Inpatient Rehabilitation Facilities with attached ?Privacy Act [place] Records? was provided and verbally reviewed with: Family ? ?Emergency Contact Information ?Contact Information   ? ? Name Relation Home Work Mobile  ? Spoerl,[name] Spouse [phone]  [phone]  ? Nechama Guard Mother [phone]  [phone]  ? Schlosser,Steve Father [phone]    ? ?  ? ?Current Medical History  ?Patient Admitting Diagnosis: Neuroendocrine tumor s/p surgical removal ? ?History of Present Illness: 46 year old with biopsy proven neuroendocrine tumor in the terminal ileum on colonoscopy. She had undergone colonoscopy due to symptoms of watery diarrhea revealed during evaluation for biliary disease. She has a history of Chiari malformation s/p surgery and is s/p VP shunt placement for hydrocephalus 10/2016. She also has a history of von Willebrand disease type 1A. Additional medial history of Arthritis, Asthma, COPD, seizures, and biliary  dyskinesia. ? ?Presented for surgery on 05/05/2021. Underwent exploratory lap, right hemicolectomy and cholecystectomy on 3/9. Well differentiated neuroendocrine tumor with 1/7 + nodes. Dr Marin Olp consulted and felt likely will need adjuvant chemo. Respiratory failure ad intubated on 3/16 suspected PNA and treated. Trached 4/4/ and now downsized to 4 uncuffed and capped. Lasix transitioned to po for treatment of volume overload. Altered mental status with CT normal with history of VP shunt. Felt most likely Depakote induced hyperammonemic encephalopathy. Changed to Keppra, EEG without seizures. Encephalopathy resolved. Receiving nocturnal tube feeds via PEG with Cortrak removed. TPN discontinued. PEG placed on 4/3. Diet advanced to Regular diet with dietician following for supplements and Tube feeds. On valium for anxiety prn and Seroquel at HS only.  ? ?Patient's medical record from [facility] has been reviewed by the rehabilitation admission coordinator and physician. ? ?Past Medical History  ?Past Medical History:  ?Diagnosis Date  ? Acid reflux   ? Allergy   ? Anxiety   ? Arthritis   ? lower back  ? ASCUS (atypical squamous cells of undetermined significance) on Pap smear 03/2011  ? NEG HR HPV  ? Asthma   ? Cancer [facility])   ? skin cancer- age 51ish  ? Cervical dysplasia, mild 08/2010  ? LGSIL colposcopy biopsy showing koilocytotic atypia  ? Clotting  disorder ([place])   ? Depression   ? Endometriosis   ? Headache(784.0)   ? High risk HPV infection 12/2011  ? Pap normal  ? Hypertension   ? Pneumonia   ? 2016ish  ? Seizures ([place])   ? seizure disorder  ? Smoker   ? Von Willebrand disease ([place])   ? ?Has the patient had major surgery during 100 days prior to admission? Yes ? ?Family History   ?family history includes Cancer in her maternal uncle; Diabetes in her father; Heart disease in her father and mother; Hyperlipidemia in her father and mother; Hypertension in her father and mother; Lymphoma in her sister;  Prostate cancer in her father; Stroke in her father. ? ?Current Medications ? ?Current Facility-Administered Medications:  ?  acetaminophen (TYLENOL) tablet 1,000 mg, 1,000 mg, Per Tube, Q6H, Maczis, Barth Kirks, PA-C, 1,000 mg at 06/15/21 2671 ?  albuterol (PROVENTIL) (2.5 MG/3ML) 0.083% nebulizer solution 2.5 mg, 2.5 mg, Nebulization, Q4H PRN, Jillyn Ledger, PA-C, 2.5 mg at 05/31/21 0453 ?  arformoterol (BROVANA) nebulizer solution 15 mcg, 15 mcg, Nebulization, BID, Jillyn Ledger, PA-C, 15 mcg at 06/15/21 2458 ?  atorvastatin (LIPITOR) tablet 20 mg, 20 mg, Per Tube, Daily, Maczis, Barth Kirks, PA-C, 20 mg at 06/15/21 0998 ?  budesonide (PULMICORT) nebulizer solution 0.25 mg, 0.25 mg, Nebulization, BID, Jillyn Ledger, PA-C, 0.25 mg at 06/15/21 3382 ?  chlorhexidine gluconate (MEDLINE KIT) (PERIDEX) 0.12 % solution 15 mL, 15 mL, Mouth Rinse, BID, Jillyn Ledger, PA-C, 15 mL at 06/15/21 0827 ?  Chlorhexidine Gluconate Cloth 2 % PADS 6 each, 6 each, Topical, Q0600, Jillyn Ledger, PA-C, 6 each at 06/15/21 0615 ?  cholestyramine light (PREVALITE) packet 4 g, 4 g, Oral, TID, [name], MD, 4 g at 06/14/21 2125 ?  diazepam (VALIUM) tablet 4 mg, 4 mg, Oral, Q8H, Lovick, Montel Culver, MD, 4 mg at 06/15/21 5053 ?  enoxaparin (LOVENOX) injection 40 mg, 40 mg, Subcutaneous, Q24H, Maczis, Barth Kirks, PA-C, 40 mg at 06/15/21 0827 ?  feeding supplement (OSMOLITE 1.5 CAL) liquid 1,000 mL, 1,000 mL, Per Tube, Q24H, [name], MD, 1,000 mL at 06/14/21 1826 ?  feeding supplement (PROSource TF) liquid 45 mL, 45 mL, Per Tube, BID, Lovick, Montel Culver, MD, 45 mL at 06/15/21 0825 ?  furosemide (LASIX) tablet 80 mg, 80 mg, Per Tube, BID, [name], MD, 80 mg at 06/15/21 0826 ?  Gerhardt's butt cream, , Topical, PRN, Maczis, Barth Kirks, PA-C, 2 application. at 06/10/21 1659 ?  guaiFENesin (ROBITUSSIN) 100 MG/5ML liquid 10 mL, 10 mL, Per Tube, Q4H, [name], MD, 10 mL at 06/15/21 9767 ?  insulin aspart  (novoLOG) injection 0-9 Units, 0-9 Units, Subcutaneous, Q4H, Jillyn Ledger, PA-C, 1 Units at 06/15/21 3419 ?  levETIRAcetam (KEPPRA) 100 MG/ML solution 750 mg, 750 mg, Per Tube, BID, Jillyn Ledger, PA-C, 750 mg at 06/15/21 0825 ?  loperamide HCl (IMODIUM) 1 MG/7.5ML suspension 4 mg, 4 mg, Per Tube, Q8H, [name], MD, 4 mg at 06/15/21 3790 ?  meclizine (ANTIVERT) tablet 25 mg, 25 mg, Per Tube, TID PRN, Jillyn Ledger, PA-C ?  MEDLINE mouth rinse, 15 mL, Mouth Rinse, 10 times per day, Jillyn Ledger, PA-C, 15 mL at 06/15/21 0615 ?  midazolam (VERSED) injection 0.5 mg, 0.5 mg, Intravenous, Q8H PRN, [name], MD, 0.5 mg at 06/15/21 0028 ?  montelukast (SINGULAIR) tablet 10 mg, 10 mg, Per Tube, QHS, Maczis, Legrand Como  M, PA-C, 10 mg at 06/14/21 2124 ?  ondansetron (ZOFRAN-ODT) disintegrating tablet 4 mg, 4 mg, Oral, Q6H PRN, 4 mg at 06/05/21 2058 **OR** ondansetron (ZOFRAN) injection 4 mg, 4 mg, Intravenous, Q6H PRN, Jillyn Ledger, PA-C, 4 mg at 06/15/21 2003 ?  oxyCODONE (ROXICODONE) 5 MG/5ML solution 5-10 mg, 5-10 mg, Per Tube, Q4H PRN, Jillyn Ledger, PA-C, 10 mg at 06/15/21 7944 ?  potassium chloride (KLOR-CON) packet 40 mEq, 40 mEq, Per Tube, Daily, [name], MD, 40 mEq at 06/15/21 0827 ?  QUEtiapine (SEROQUEL) tablet 12.5 mg, 12.5 mg, Per Tube, QHS, [name], MD, 12.5 mg at 06/14/21 2124 ?  revefenacin (YUPELRI) nebulizer solution 175 mcg, 175 mcg, Nebulization, Daily, Jillyn Ledger, PA-C, 175 mcg at 06/15/21 4619 ?  saccharomyces boulardii (FLORASTOR) capsule 250 mg, 250 mg, Per Tube, BID, [name], MD, 250 mg at 06/15/21 0826 ?  sodium chloride 0.9 % nebulizer solution 3 mL, 3 mL, Nebulization, Q8H PRN, [place] Crane M, PA-C ?  sodium chloride flush (NS) 0.9 % injection 10-40 mL, 10-40 mL, Intracatheter, Q12H, Jillyn Ledger, PA-C, 10 mL at 06/15/21 0122 ?  sodium chloride flush (NS) 0.9 % injection 10-40 mL, 10-40 mL, Intracatheter, PRN, Jillyn Ledger, PA-C, 30 mL at 06/04/21 2245 ?  white petrolatum (VASELINE) gel, , Topical, PRN, Maczis, Barth Kirks, PA-C, 1 application. at 06/04/21 0551 ? ?Patients Current Diet:  ?Diet Order   ? ?       ?  Diet reg

## 2021-06-14 NOTE — Progress Notes (Signed)
? ?General Surgery Follow Up Note ? ?Subjective:  ?  ?Overnight Issues:  ? ?Objective:  ?Vital signs for last 24 hours: ?Temp:  [97.7 ?F (36.5 ?C)-98.6 ?F (37 ?C)] 97.7 ?F (36.5 ?C) (04/18 1500) ?Pulse Rate:  [97-112] 103 (04/18 1532) ?Resp:  [18-28] 18 (04/18 1532) ?BP: (116-132)/(60-95) 127/62 (04/18 1500) ?SpO2:  [95 %-100 %] 100 % (04/18 1532) ?Weight:  [88.5 kg] 88.5 kg (04/18 0500) ? ?Hemodynamic parameters for last 24 hours: ?  ? ?Intake/Output from previous day: ?04/17 0701 - 04/18 0700 ?In: 250 [P.O.:240; I.V.:10] ?Out: -   ?Intake/Output this shift: ?Total I/O ?In: 480 [P.O.:480] ?Out: 400 [Urine:400] ? ?Vent settings for last 24 hours: ?  ? ?Physical Exam:  ?Gen: comfortable, no distress ?Neuro: non-focal exam ?HEENT: PERRL ?Neck: supple ?CV: RRR ?Pulm: unlabored breathing ?Abd: soft, NT, abd TTP ?GU: clear yellow urine ?Extr: wwp, no edema ? ? ?Results for orders placed or performed during the hospital encounter of 05/05/21 (from the past 24 hour(s))  ?Glucose, capillary     Status: None  ? Collection Time: 06/13/21  4:55 PM  ?Result Value Ref Range  ? Glucose-Capillary 91 70 - 99 mg/dL  ?Glucose, capillary     Status: Abnormal  ? Collection Time: 06/13/21  7:35 PM  ?Result Value Ref Range  ? Glucose-Capillary 117 (H) 70 - 99 mg/dL  ?Glucose, capillary     Status: Abnormal  ? Collection Time: 06/13/21 11:35 PM  ?Result Value Ref Range  ? Glucose-Capillary 124 (H) 70 - 99 mg/dL  ?Glucose, capillary     Status: Abnormal  ? Collection Time: 06/14/21  4:43 AM  ?Result Value Ref Range  ? Glucose-Capillary 109 (H) 70 - 99 mg/dL  ?Glucose, capillary     Status: Abnormal  ? Collection Time: 06/14/21  7:13 AM  ?Result Value Ref Range  ? Glucose-Capillary 134 (H) 70 - 99 mg/dL  ? Comment 1 Notify RN   ? Comment 2 Document in Chart   ?Glucose, capillary     Status: Abnormal  ? Collection Time: 06/14/21 12:22 PM  ?Result Value Ref Range  ? Glucose-Capillary 110 (H) 70 - 99 mg/dL  ? ? ?Assessment & Plan: ? ?Present  on Admission: ? Neuroendocrine carcinoma ([place]) ? ? ? LOS: 40 days  ? ?Additional comments:I reviewed the patient's new clinical lab test results.   and I reviewed the patients new imaging test results.   ? ?Neuroendocrine tumor on colonoscopy - s/p exlap, R hemicolectomy, cholecystectomy 3/9. Well differentiated neuroendocrine tumor with 1/7 +nodes. D/w Dr. Marin Olp 3/15 will likely need adjuvant chemo. Staples out 3/24. Bottom 2cm of wound granulating ?Respiratory failure 2/2 volume overload/?PNA - CT chest 3/11 and 3/13 with no PE. Intubated 3/16, suspect PNA based on CXR but negative resp cx and BAL, s/p 7d empiric merrem. Repeat pulm c/s 4/1, brovana, yupelri, singulair added. Trach 4/4, downsize to [facility] 4/11, now in 4CL, cap today  ?Volume overload - seems euvolemic, lasix BID, goal even to 500 neg/24h, transition to PO 4/8. Echo 3/28, essentially normal.  ?Altered mental status - CT head 3/13, 3/16/ 3/17 normal (h/o VP shunt). Empiric cefepime for aspiration, 5d course, end 4/12. Resp cx today. Ucx 3/13 negative. Bcx 3/15 NGTD. LP 3/16 NGTD. CT A/P 3/16 negative. 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 Neurology on board, changed to keppra, cont EEG without sz, encephalopathy resolved ?Anxiety - on valium, drop to 4q8 4/18, 12.5TID seroquel HS only ?Diarrhea - improved, restarted imodium, increased cholestyramine 4/16 ?vWD -  humate P per Heme, end 3/12, stable ?Transaminitis - likely 2/2 TPN, now d/c'd ?Vomiting - seems post-tussive, much improved with CL trach, downsize to 4CL 4/14, tol full feed and cleared for FLD ?Leukocytosis, fever - 20 this AM, prior infectious workup unrevealing, curbside ID consult 3/28 with no add'l recs, LE duplex negative 4/8, CXR 4/8 unchanged, 150 diflucan for vaginal yeast infection concerns given 4/7, UA 4/8 negative ?FEN - NPO, OGT, tol TF at 65, change to 14-16h feeds, abd pain improved when binder is loose ?ABLA - stable ?DVT - SCDs, s/p DDVAP, LMWH, LE  duplex, negative 4/8 ?Foley - d/c 4/5, replaced 4/8 for retention, ToV 4/14, BSC, having difficulty with voiding in bed ?Dispo - 4NP, PT/OT/SLP, anticipate LTAC, awaiting insurance ? ? ?[name], MD ?Trauma & General Surgery ?Please use [email or web address] to contact on call provider ? ?06/14/2021 ? ?*Care during the described time interval was provided by me. I have reviewed this patient's available data, including medical history, events of note, physical examination and test results as part of my evaluation. ? ?

## 2021-06-14 NOTE — Progress Notes (Signed)
Patient is allowed to take a shower per Dr. Bobbye Morton. I tried to give her one at 530 pm but she was too tired.  ?

## 2021-06-14 NOTE — Progress Notes (Signed)
Nutrition Follow-up ? ?DOCUMENTATION CODES:  ? ?Obesity unspecified ? ?INTERVENTION:  ? ?- Encourage PO intake ? ?Continue nocturnal tube feeds via PEG: ?- Osmolite 1.5 @ 65 ml/hr x 14 hours from 1800 to 0600 (total of 910 ml) ?- ProSource TF 45 ml BID ? ?Nocturnal tube feeding regimen provides 1445 kcal, 79 grams of protein, and 693 ml of H2O (meets 66% of minimum kcal needs and 72% of minimum protein needs). ? ?NUTRITION DIAGNOSIS:  ? ?Increased nutrient needs related to acute illness as evidenced by estimated needs. ? ?Ongoing, being addressed via diet advancement and tube feeds ? ?GOAL:  ? ?Patient will meet greater than or equal to 90% of their needs ? ?Progressing ? ?MONITOR:  ? ?PO intake, Supplement acceptance, Labs, Weight trends, TF tolerance, Skin ? ?REASON FOR ASSESSMENT:  ? ?Other (Cortrak) ?  ? ?ASSESSMENT:  ? 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 PMH significant for type IA von Willebrand disease. ? ?03/09 - s/p ex-lap, R hemicolectomy, cholecystectomy for well differentiated neuroendocrine turmor with 1/7 + nodes ?03/15 - s/p Cortrak placement (tip gastric), started TF, Cortrak removed later that night ?03/16 - intubated, OG tube placed  ?03/17 - developed emesis, TF held ?03/18 - TPN initiated ?03/20 - TF resumed and advanced to goal ?04/03 - s/p trach and PEG ?04/04 - TF held for nausea/abd pain ?04/05 - TF restarted at 1/2 goal rate ?04/06 - TF increased to goal ?04/08 - TF held due to vomiting episode ?04/09 - TF restarted at 1/2 goal rate ?04/11 - s/p MBS, diet advanced to full liquids ?04/12 - TF increased to goal by MD ?04/17 - diet advanced to Regular, TF changed to nocturnal over 14 hours ? ?Spoke with pt and family at bedside. Lurline Idol has been capped. Pt with 100% meal completion documented for 1 meal yesterday. Pt and family report that this is inaccurate. Pt states that she ate most of the chicken on her meal tray last night and also consumed a lemon [place] ice.  Pt ordered Cheerios and banana for breakfast but has not touched either. Pt did request a mango [place] ice at time of RD visit which RD provided. ? ?Pt reports that nocturnal tube feeds went well overnight. No issues noted. Family aware of plan for tube feeds to be off during daytime hours to stimulate appetite and promote PO intake. ? ?Pt does not like the Boost Breeze supplements and states that they are "gross." RD will discontinue these. Pt is not amenable to trying any other oral nutrition supplements at this time. ? ?Weight fairly stable over the last week. Will continue to monitor trends. ? ?Admit weight: 93.3 kg ?Current weight: 88.5 kg ? ?Meal Completion: 100% x 1 meal on 4/17 ? ?Medications reviewed and include: prevalite 4 grams TID, Boost Breeze TID, lasix 80 mg BID, SSI q 4 hours, imodium 4 mg q 8 hours, klor-con 40 mEq daily, florastor 250 mg BID ? ?Labs reviewed: WBC 13.4, hemoglobin 8.6 ?CBG's: 91-134 x 24 hours ? ?Diet Order:   ?Diet Order   ? ?       ?  Diet regular Room service appropriate? Yes with Assist; Fluid consistency: Thin  Diet effective now       ?  ? ?  ?  ? ?  ? ? ?EDUCATION NEEDS:  ? ?Education needs have been addressed ? ?Skin:  Skin Assessment: ?Skin Integrity Issues: ?Stage I: L heel ?Incisions: abdomen (closed), neck ?Other: MASD anus ? ?  Last BM:  06/12/21 ? ?Height:  ? ?Ht Readings from Last 1 Encounters:  ?05/12/21 '5\' 3"'$  (1.6 m)  ? ? ?Weight:  ? ?Wt Readings from Last 1 Encounters:  ?06/14/21 88.5 kg  ? ? ?Ideal Body Weight:  52.3 kg ? ?BMI:  Body mass index is 34.56 kg/m?. ? ?Estimated Nutritional Needs:  ? ?Kcal:  2200-2400 ? ?Protein:  110-130 grams ? ?Fluid:  >/= 2.0 L ? ? ? ?Gustavus Bryant, MS, RD, LDN ?Inpatient Clinical Dietitian ?Please see AMiON for contact information. ? ?

## 2021-06-14 NOTE — Progress Notes (Signed)
Inpatient Rehabilitation Admissions Coordinator  ? ?I met with patient and her parents at bedside. I discussed goals and expectations of a possible Cir admit. I was notified by [employer] rep, Anderson Malta, that LTACH denied by [facility]. Patient in agreement to possible Cir admit. I will begin insurance Auth for possible Cir admit. They are in agreement. ? ?Danne Baxter, RN, MSN ?Rehab Admissions Coordinator ?[phone]) [phone] ?06/14/2021 11:47 AM ? ?

## 2021-06-14 NOTE — Progress Notes (Signed)
Physical Therapy Treatment ?Patient Details ?Name: Cassidy Tran ?MRN: [id number] ?DOB: Aug 01, 1975 ?Today's Date: 06/14/2021 ? ? ?History of Present Illness 46 y.o. female presents to [facility] hospital on 05/05/2021 and underwent ex-lap with cholecystectomy and R hemicolectomy for neuroendocrine tumor. Pt developed respiratory failure due to volume overload and required intubation on 3/16. Pt developed AMS during admission with rising white cell counts, underwent lumbar punture on 3/16. Pt underwent bronchoscopy on 3/21. Bronchoscopy 4/1, tracheostomy 4/3. PMH includes ASCUS, clotting disorder, seizures, PNA, Von Willebrand disease, VP shunt placement. ? ?  ?PT Comments  ? ? Pt ambulated 12' in room with RW and min A. Noted decreased R ankle df but pt able to lift at knee to avoid dragging toes. Pt very fatigued after this distance. O2 increased from 3L to 5L for recovery with sats going from low 80's back into upper 90's. Pt performed supine and SL there ex with assist from therapist. HR 108 bpm during exercises. Pt with notably weaker RLE than L in addition to the decreased dorsiflexion. PT will continue to follow. ?   ?Recommendations for follow up therapy are one component of a multi-disciplinary discharge planning process, led by the attending physician.  Recommendations may be updated based on patient status, additional functional criteria and insurance authorization. ? ?Follow Up Recommendations ? Acute inpatient rehab (3hours/day) ?  ?  ?Assistance Recommended at Discharge Frequent or constant Supervision/Assistance  ?Patient can return home with the following Assistance with cooking/housework;Assistance with feeding;Direct supervision/assist for medications management;Direct supervision/assist for financial management;Assist for transportation;Help with stairs or ramp for entrance;A lot of help with walking and/or transfers;A lot of help with bathing/dressing/bathroom ?  ?Equipment Recommendations ? Rolling walker  (2 wheels);Wheelchair (measurements PT);Wheelchair cushion (measurements PT);Hospital bed  ?  ?Recommendations for Other Services   ? ? ?  ?Precautions / Restrictions Precautions ?Precautions: Fall ?Precaution Comments: trach collar, watch HR ?Restrictions ?Weight Bearing Restrictions: No  ?  ? ?Mobility ? Bed Mobility ?Overal bed mobility: Needs Assistance ?Bed Mobility: Sit to Supine, Rolling ?Rolling: Supervision ?  ?  ?Sit to supine: Min guard ?  ?General bed mobility comments: pt able to elevated BLE's against gravity into bed ?  ? ?Transfers ?Overall transfer level: Needs assistance ?Equipment used: Rolling walker (2 wheels) ?Transfers: Sit to/from Stand ?Sit to Stand: Min guard ?  ?  ?  ?  ?  ?General transfer comment: min guard from toilet and bed. Cues for lifting chest as she comes up ?  ? ?Ambulation/Gait ?Ambulation/Gait assistance: Min assist ?Gait Distance (Feet): 12 Feet ?Assistive device: Rolling walker (2 wheels) ?Gait Pattern/deviations: Step-through pattern, Decreased weight shift to right, Decreased dorsiflexion - right ?Gait velocity: reduced ?Gait velocity interpretation: <1.31 ft/sec, indicative of household ambulator ?  ?General Gait Details: heavy reliance on RW. Decreased df R with low step height. Needed assist turning RW due to fatigue ? ? ?Stairs ?  ?  ?  ?  ?  ? ? ?Wheelchair Mobility ?  ? ?Modified Rankin (Stroke Patients Only) ?  ? ? ?  ?Balance Overall balance assessment: Needs assistance ?Sitting-balance support: Feet supported ?Sitting balance-Leahy Scale: Fair ?Sitting balance - Comments: able to sit EOB with supervision ?  ?Standing balance support: Bilateral upper extremity supported, During functional activity, Reliant on assistive device for balance ?Standing balance-Leahy Scale: Poor ?Standing balance comment: heavy lean on RW ?  ?  ?  ?  ?  ?  ?  ?  ?  ?  ?  ?  ? ?  ?  Cognition Arousal/Alertness: Awake/alert ?Behavior During Therapy: [facility] for tasks assessed/performed ?Overall  Cognitive Status: Impaired/Different from baseline ?Area of Impairment: Attention, Following commands, Awareness, Problem solving, Safety/judgement ?  ?  ?  ?  ?  ?  ?  ?  ?  ?Current Attention Level: Selective ?  ?Following Commands: Follows one step commands consistently, Follows multi-step commands with increased time ?Safety/Judgement: Decreased awareness of deficits ?Awareness: Emergent ?Problem Solving: Slow processing ?General Comments: pt easily frustrated but will work when encouraged and pointed towards end goals ?  ?  ? ?  ?Exercises General Exercises - Lower Extremity ?Ankle Circles/Pumps: PROM, Right, AROM, Left, 20 reps, Supine, Limitations ?Ankle Circles/Pumps Limitations: pt lacking df on R, PROM performed. Pt is able to wiggle toes on that side in limited range ?Hip ABduction/ADduction: AAROM, Both, 5 reps, Sidelying ?Straight Leg Raises: AAROM, Both, 10 reps, Supine ?Other Exercises ?Other Exercises: bridging x5 ?Other Exercises: resisted rolling on each side 5x ?Other Exercises: seated pull up with bed in chair position and both rails up x3 ? ?  ?General Comments General comments (skin integrity, edema, etc.): Pt on 3L O2, desat to 80's after using bathroom, O2 increased to 5L, SPO2 returned to high 90's and O2 decreased back to 3L. HR 108 bpm with exercises ?  ?  ? ?Pertinent Vitals/Pain Pain Assessment ?Pain Assessment: Faces ?Faces Pain Scale: Hurts a little bit ?Pain Location: abdomen ?Pain Descriptors / Indicators: Grimacing, Discomfort ?Pain Intervention(s): Limited activity within patient's tolerance, Monitored during session  ? ? ?Home Living   ?  ?  ?  ?  ?  ?  ?  ?  ?  ?   ?  ?Prior Function    ?  ?  ?   ? ?PT Goals (current goals can now be found in the care plan section) Acute Rehab PT Goals ?Patient Stated Goal: to improve strength and reduce caregiver burden ?PT Goal Formulation: With patient/family ?Time For Goal Achievement: 06/15/21 ?Potential to Achieve Goals: Fair ?Progress  towards PT goals: Progressing toward goals ? ?  ?Frequency ? ? ? Min 4X/week ? ? ? ?  ?PT Plan Current plan remains appropriate  ? ? ?Co-evaluation   ?  ?  ?  ?  ? ?  ?AM-PAC PT "6 Clicks" Mobility   ?Outcome Measure ? Help needed turning from your back to your side while in a flat bed without using bedrails?: A Little ?Help needed moving from lying on your back to sitting on the side of a flat bed without using bedrails?: A Little ?Help needed moving to and from a bed to a chair (including a wheelchair)?: A Little ?Help needed standing up from a chair using your arms (e.g., wheelchair or bedside chair)?: A Little ?Help needed to walk in hospital room?: A Lot ?Help needed climbing 3-5 steps with a railing? : Total ?6 Click Score: 15 ? ?  ?End of Session Equipment Utilized During Treatment: Oxygen (abdominal binder) ?Activity Tolerance: Patient limited by fatigue ?Patient left: in bed;with call bell/phone within reach;with bed alarm set;with family/visitor present ?Nurse Communication: Mobility status ?PT Visit Diagnosis: Muscle weakness (generalized) (M62.81);Pain;Other abnormalities of gait and mobility (R26.89) ?Pain - part of body:  (abdomen) ?  ? ? ?Time: 0814-4818 ?PT Time Calculation (min) (ACUTE ONLY): 37 min ? ?Charges:  $Gait Training: 8-22 mins ?$Therapeutic Exercise: 8-22 mins          ?          ? ?Leighton Roach, PT  ?  Acute Rehab Services ? Pager [phone] ?Office [phone] ? ? ? ?[place] ?06/14/2021, 1:25 PM ? ?

## 2021-06-15 ENCOUNTER — Other Ambulatory Visit: Payer: Self-pay

## 2021-06-15 ENCOUNTER — Encounter (HOSPITAL_COMMUNITY): Payer: Self-pay | Admitting: Physical Medicine & Rehabilitation

## 2021-06-15 ENCOUNTER — Inpatient Hospital Stay (HOSPITAL_COMMUNITY)
Admission: RE | Admit: 2021-06-15 | Discharge: 2021-06-25 | DRG: 945 | Disposition: A | Discharge status: Home / Self Care | Payer: No Typology Code available for payment source | Source: Intra-hospital | Attending: Physical Medicine & Rehabilitation | Admitting: Physical Medicine & Rehabilitation

## 2021-06-15 DIAGNOSIS — K640 First degree hemorrhoids: Secondary | ICD-10-CM

## 2021-06-15 DIAGNOSIS — Z833 Family history of diabetes mellitus: Secondary | ICD-10-CM | POA: Diagnosis not present

## 2021-06-15 DIAGNOSIS — Z880 Allergy status to penicillin: Secondary | ICD-10-CM | POA: Diagnosis not present

## 2021-06-15 DIAGNOSIS — Z807 Family history of other malignant neoplasms of lymphoid, hematopoietic and related tissues: Secondary | ICD-10-CM | POA: Diagnosis not present

## 2021-06-15 DIAGNOSIS — Z85828 Personal history of other malignant neoplasm of skin: Secondary | ICD-10-CM

## 2021-06-15 DIAGNOSIS — D62 Acute posthemorrhagic anemia: Secondary | ICD-10-CM | POA: Diagnosis not present

## 2021-06-15 DIAGNOSIS — Z886 Allergy status to analgesic agent status: Secondary | ICD-10-CM

## 2021-06-15 DIAGNOSIS — G40909 Epilepsy, unspecified, not intractable, without status epilepticus: Secondary | ICD-10-CM | POA: Diagnosis present

## 2021-06-15 DIAGNOSIS — Z79899 Other long term (current) drug therapy: Secondary | ICD-10-CM | POA: Diagnosis not present

## 2021-06-15 DIAGNOSIS — J9690 Respiratory failure, unspecified, unspecified whether with hypoxia or hypercapnia: Secondary | ICD-10-CM | POA: Diagnosis present

## 2021-06-15 DIAGNOSIS — I10 Essential (primary) hypertension: Secondary | ICD-10-CM | POA: Diagnosis present

## 2021-06-15 DIAGNOSIS — M79671 Pain in right foot: Secondary | ICD-10-CM | POA: Diagnosis not present

## 2021-06-15 DIAGNOSIS — K31819 Angiodysplasia of stomach and duodenum without bleeding: Secondary | ICD-10-CM

## 2021-06-15 DIAGNOSIS — Z823 Family history of stroke: Secondary | ICD-10-CM

## 2021-06-15 DIAGNOSIS — K921 Melena: Secondary | ICD-10-CM

## 2021-06-15 DIAGNOSIS — Z931 Gastrostomy status: Secondary | ICD-10-CM

## 2021-06-15 DIAGNOSIS — K219 Gastro-esophageal reflux disease without esophagitis: Secondary | ICD-10-CM | POA: Diagnosis present

## 2021-06-15 DIAGNOSIS — R5381 Other malaise: Secondary | ICD-10-CM | POA: Diagnosis present

## 2021-06-15 DIAGNOSIS — Z8042 Family history of malignant neoplasm of prostate: Secondary | ICD-10-CM | POA: Diagnosis not present

## 2021-06-15 DIAGNOSIS — Z885 Allergy status to narcotic agent status: Secondary | ICD-10-CM | POA: Diagnosis not present

## 2021-06-15 DIAGNOSIS — Z800 Family history of malignant neoplasm of digestive organs: Secondary | ICD-10-CM

## 2021-06-15 DIAGNOSIS — Z930 Tracheostomy status: Secondary | ICD-10-CM

## 2021-06-15 DIAGNOSIS — J9601 Acute respiratory failure with hypoxia: Secondary | ICD-10-CM | POA: Diagnosis not present

## 2021-06-15 DIAGNOSIS — E876 Hypokalemia: Secondary | ICD-10-CM | POA: Diagnosis present

## 2021-06-15 DIAGNOSIS — Z982 Presence of cerebrospinal fluid drainage device: Secondary | ICD-10-CM | POA: Diagnosis not present

## 2021-06-15 DIAGNOSIS — Z83438 Family history of other disorder of lipoprotein metabolism and other lipidemia: Secondary | ICD-10-CM | POA: Diagnosis not present

## 2021-06-15 DIAGNOSIS — K2100 Gastro-esophageal reflux disease with esophagitis, without bleeding: Secondary | ICD-10-CM

## 2021-06-15 DIAGNOSIS — R197 Diarrhea, unspecified: Secondary | ICD-10-CM | POA: Diagnosis not present

## 2021-06-15 DIAGNOSIS — R1013 Epigastric pain: Secondary | ICD-10-CM

## 2021-06-15 DIAGNOSIS — E8770 Fluid overload, unspecified: Secondary | ICD-10-CM | POA: Diagnosis present

## 2021-06-15 DIAGNOSIS — Z8249 Family history of ischemic heart disease and other diseases of the circulatory system: Secondary | ICD-10-CM

## 2021-06-15 DIAGNOSIS — K2970 Gastritis, unspecified, without bleeding: Secondary | ICD-10-CM

## 2021-06-15 DIAGNOSIS — F17210 Nicotine dependence, cigarettes, uncomplicated: Secondary | ICD-10-CM | POA: Diagnosis present

## 2021-06-15 DIAGNOSIS — Z9049 Acquired absence of other specified parts of digestive tract: Secondary | ICD-10-CM | POA: Diagnosis not present

## 2021-06-15 DIAGNOSIS — D6800 Von Willebrand disease, unspecified: Secondary | ICD-10-CM | POA: Diagnosis present

## 2021-06-15 DIAGNOSIS — D128 Benign neoplasm of rectum: Secondary | ICD-10-CM

## 2021-06-15 DIAGNOSIS — R1011 Right upper quadrant pain: Secondary | ICD-10-CM

## 2021-06-15 DIAGNOSIS — K6389 Other specified diseases of intestine: Secondary | ICD-10-CM

## 2021-06-15 LAB — CBC
HCT: 26.3 % — ABNORMAL LOW (ref 36.0–46.0)
Hemoglobin: 8.5 g/dL — ABNORMAL LOW (ref 12.0–15.0)
MCH: 28.8 pg (ref 26.0–34.0)
MCHC: 32.3 g/dL (ref 30.0–36.0)
MCV: 89.2 fL (ref 80.0–100.0)
Platelets: 379 10*3/uL (ref 150–400)
RBC: 2.95 MIL/uL — ABNORMAL LOW (ref 3.87–5.11)
RDW: 14.6 % (ref 11.5–15.5)
WBC: 9.6 10*3/uL (ref 4.0–10.5)
nRBC: 0 % (ref 0.0–0.2)

## 2021-06-15 LAB — BASIC METABOLIC PANEL
Anion gap: 6 (ref 5–15)
BUN: 9 mg/dL (ref 6–20)
CO2: 33 mmol/L — ABNORMAL HIGH (ref 22–32)
Calcium: 8.9 mg/dL (ref 8.9–10.3)
Chloride: 99 mmol/L (ref 98–111)
Creatinine, Ser: 0.52 mg/dL (ref 0.44–1.00)
GFR, Estimated: >60 mL/min (ref >60)
Glucose, Bld: 130 mg/dL — ABNORMAL HIGH (ref 70–99)
Potassium: 3.5 mmol/L (ref 3.5–5.1)
Sodium: 138 mmol/L (ref 135–145)

## 2021-06-15 LAB — GLUCOSE, CAPILLARY
Glucose-Capillary: 136 mg/dL — ABNORMAL HIGH (ref 70–99)
Glucose-Capillary: 135 mg/dL — ABNORMAL HIGH (ref 70–99)
Glucose-Capillary: 125 mg/dL — ABNORMAL HIGH (ref 70–99)

## 2021-06-15 MED ORDER — SORBITOL 70 % SOLN
30.0000 mL | Freq: Every day | Status: DC | PRN
Start: 2021-06-15 — End: 2021-06-25
  Filled 2021-06-15: qty 30

## 2021-06-15 MED ORDER — ONDANSETRON 4 MG PO TBDP
4.0000 mg | ORAL_TABLET | Freq: Four times a day (QID) | ORAL | Status: DC | PRN
Start: 2021-06-15 — End: 2021-06-25
  Administered 2021-06-23: 4 mg via ORAL
  Filled 2021-06-15 (×2): qty 1

## 2021-06-15 MED ORDER — PROCHLORPERAZINE EDISYLATE 10 MG/2ML IJ SOLN: 5.0000 mg | Freq: Four times a day (QID) | INTRAMUSCULAR | Status: DC | PRN

## 2021-06-15 MED ORDER — FUROSEMIDE 40 MG PO TABS
80.0000 mg | ORAL_TABLET | Freq: Two times a day (BID) | ORAL | Status: DC
  Administered 2021-06-15 – 2021-06-17 (×4): 80 mg via ORAL
  Filled 2021-06-15 (×4): qty 2

## 2021-06-15 MED ORDER — CHLORHEXIDINE GLUCONATE CLOTH 2 % EX PADS
6.0000 | MEDICATED_PAD | Freq: Every day | CUTANEOUS | Status: DC
  Administered 2021-06-16 – 2021-06-17 (×2): 6 via TOPICAL

## 2021-06-15 MED ORDER — PROSOURCE TF PO LIQD
45.0000 mL | Freq: Two times a day (BID) | ORAL | Status: DC
  Administered 2021-06-15: 45 mL
  Filled 2021-06-15 (×2): qty 45

## 2021-06-15 MED ORDER — CARVEDILOL 3.125 MG PO TABS
3.1250 mg | ORAL_TABLET | Freq: Every day | ORAL | Status: DC
  Administered 2021-06-15 – 2021-06-16 (×2): 3.125 mg via ORAL
  Filled 2021-06-15 (×2): qty 1

## 2021-06-15 MED ORDER — MECLIZINE HCL 25 MG PO TABS: 25.0000 mg | ORAL_TABLET | Freq: Three times a day (TID) | ORAL | Status: DC | PRN

## 2021-06-15 MED ORDER — BUDESONIDE 0.5 MG/2ML IN SUSP
0.2500 mg | Freq: Two times a day (BID) | RESPIRATORY_TRACT | Status: DC
  Administered 2021-06-15 – 2021-06-25 (×20): 0.25 mg via RESPIRATORY_TRACT
  Filled 2021-06-15 (×20): qty 2

## 2021-06-15 MED ORDER — ENOXAPARIN SODIUM 40 MG/0.4ML IJ SOSY: 40.0000 mg | PREFILLED_SYRINGE | INTRAMUSCULAR | Status: DC

## 2021-06-15 MED ORDER — ALBUTEROL SULFATE (2.5 MG/3ML) 0.083% IN NEBU
2.5000 mg | INHALATION_SOLUTION | RESPIRATORY_TRACT | Status: DC | PRN
  Filled 2021-06-15: qty 3

## 2021-06-15 MED ORDER — SODIUM CHLORIDE 0.9% FLUSH
10.0000 mL | Freq: Two times a day (BID) | INTRAVENOUS | Status: DC
  Administered 2021-06-16: 30 mL
  Administered 2021-06-16 – 2021-06-17 (×2): 10 mL

## 2021-06-15 MED ORDER — WHITE PETROLATUM EX OINT: TOPICAL_OINTMENT | CUTANEOUS | Status: DC | PRN

## 2021-06-15 MED ORDER — ACETAMINOPHEN 325 MG PO TABS
325.0000 mg | ORAL_TABLET | ORAL | Status: DC | PRN
  Administered 2021-06-20: 650 mg via ORAL
  Filled 2021-06-15: qty 2

## 2021-06-15 MED ORDER — DIPHENHYDRAMINE HCL 12.5 MG/5ML PO ELIX: 12.5000 mg | ORAL_SOLUTION | Freq: Four times a day (QID) | ORAL | Status: DC | PRN

## 2021-06-15 MED ORDER — OSMOLITE 1.5 CAL PO LIQD
1000.0000 mL | ORAL | Status: DC
  Administered 2021-06-15: 1000 mL
  Filled 2021-06-15: qty 1000

## 2021-06-15 MED ORDER — SENNOSIDES-DOCUSATE SODIUM 8.6-50 MG PO TABS
1.0000 | ORAL_TABLET | Freq: Every evening | ORAL | Status: DC | PRN
Start: 2021-06-15 — End: 2021-06-20

## 2021-06-15 MED ORDER — METHOCARBAMOL 500 MG PO TABS: 500.0000 mg | ORAL_TABLET | Freq: Four times a day (QID) | ORAL | Status: DC | PRN

## 2021-06-15 MED ORDER — FLEET ENEMA 7-19 GM/118ML RE ENEM: 1.0000 | ENEMA | Freq: Once | RECTAL | Status: DC | PRN

## 2021-06-15 MED ORDER — ATORVASTATIN CALCIUM 10 MG PO TABS
20.0000 mg | ORAL_TABLET | Freq: Every day | ORAL | Status: DC
  Administered 2021-06-16 – 2021-06-25 (×10): 20 mg via ORAL
  Filled 2021-06-15 (×10): qty 2

## 2021-06-15 MED ORDER — OXYCODONE HCL 5 MG/5ML PO SOLN
5.0000 mg | ORAL | Status: DC | PRN
  Administered 2021-06-15: 5 mg
  Filled 2021-06-15: qty 5

## 2021-06-15 MED ORDER — REVEFENACIN 175 MCG/3ML IN SOLN
175.0000 ug | Freq: Every day | RESPIRATORY_TRACT | Status: DC
  Administered 2021-06-16 – 2021-06-25 (×10): 175 ug via RESPIRATORY_TRACT
  Filled 2021-06-15 (×10): qty 3

## 2021-06-15 MED ORDER — PROCHLORPERAZINE MALEATE 5 MG PO TABS
5.0000 mg | ORAL_TABLET | Freq: Four times a day (QID) | ORAL | Status: DC | PRN
  Administered 2021-06-17: 10 mg via ORAL
  Filled 2021-06-15: qty 2

## 2021-06-15 MED ORDER — ONDANSETRON HCL 4 MG/2ML IJ SOLN
4.0000 mg | Freq: Four times a day (QID) | INTRAMUSCULAR | Status: DC | PRN
  Administered 2021-06-17: 4 mg via INTRAVENOUS
  Filled 2021-06-15: qty 2

## 2021-06-15 MED ORDER — GUAIFENESIN-DM 100-10 MG/5ML PO SYRP: 5.0000 mL | ORAL_SOLUTION | Freq: Four times a day (QID) | ORAL | Status: DC | PRN

## 2021-06-15 MED ORDER — DIAZEPAM 2 MG PO TABS
4.0000 mg | ORAL_TABLET | Freq: Three times a day (TID) | ORAL | Status: DC
Start: 2021-06-15 — End: 2021-06-22
  Administered 2021-06-15 – 2021-06-21 (×18): 4 mg via ORAL
  Filled 2021-06-15 (×19): qty 2

## 2021-06-15 MED ORDER — ZONISAMIDE 100 MG PO CAPS
100.0000 mg | ORAL_CAPSULE | Freq: Every day | ORAL | Status: DC
  Administered 2021-06-15 – 2021-06-24 (×10): 100 mg via ORAL
  Filled 2021-06-15 (×10): qty 1

## 2021-06-15 MED ORDER — SODIUM CHLORIDE 0.9 % IN NEBU
3.0000 mL | INHALATION_SOLUTION | Freq: Three times a day (TID) | RESPIRATORY_TRACT | Status: DC | PRN
  Filled 2021-06-15: qty 3

## 2021-06-15 MED ORDER — SACCHAROMYCES BOULARDII 250 MG PO CAPS
250.0000 mg | ORAL_CAPSULE | Freq: Two times a day (BID) | ORAL | Status: DC
  Administered 2021-06-15 – 2021-06-17 (×4): 250 mg
  Filled 2021-06-15 (×4): qty 1

## 2021-06-15 MED ORDER — QUETIAPINE FUMARATE 25 MG PO TABS
12.5000 mg | ORAL_TABLET | Freq: Every day | ORAL | Status: DC
  Administered 2021-06-15 – 2021-06-24 (×8): 12.5 mg via ORAL
  Filled 2021-06-15 (×9): qty 1

## 2021-06-15 MED ORDER — SODIUM CHLORIDE 0.9% FLUSH: 10.0000 mL | INTRAVENOUS | Status: DC | PRN

## 2021-06-15 MED ORDER — GERHARDT'S BUTT CREAM
TOPICAL_CREAM | CUTANEOUS | Status: DC | PRN
  Filled 2021-06-15: qty 1

## 2021-06-15 MED ORDER — ARFORMOTEROL TARTRATE 15 MCG/2ML IN NEBU
15.0000 ug | INHALATION_SOLUTION | Freq: Two times a day (BID) | RESPIRATORY_TRACT | Status: DC
  Administered 2021-06-15 – 2021-06-25 (×19): 15 ug via RESPIRATORY_TRACT
  Filled 2021-06-15 (×19): qty 2

## 2021-06-15 MED ORDER — POTASSIUM CHLORIDE 20 MEQ PO PACK
40.0000 meq | PACK | Freq: Every day | ORAL | Status: DC
  Filled 2021-06-15: qty 2

## 2021-06-15 MED ORDER — CHLORHEXIDINE GLUCONATE 0.12% ORAL RINSE (MEDLINE KIT)
15.0000 mL | Freq: Two times a day (BID) | OROMUCOSAL | Status: DC
  Administered 2021-06-15 – 2021-06-24 (×11): 15 mL via OROMUCOSAL

## 2021-06-15 MED ORDER — GUAIFENESIN 100 MG/5ML PO LIQD
10.0000 mL | ORAL | Status: DC
  Filled 2021-06-15 (×10): qty 10

## 2021-06-15 MED ORDER — PROCHLORPERAZINE 25 MG RE SUPP: 12.5000 mg | Freq: Four times a day (QID) | RECTAL | Status: DC | PRN

## 2021-06-15 MED ORDER — ALUM & MAG HYDROXIDE-SIMETH 200-200-20 MG/5ML PO SUSP
30.0000 mL | ORAL | Status: DC | PRN
Start: 2021-06-15 — End: 2021-06-25

## 2021-06-15 MED ORDER — MONTELUKAST SODIUM 10 MG PO TABS
10.0000 mg | ORAL_TABLET | Freq: Every day | ORAL | Status: DC
  Administered 2021-06-15 – 2021-06-16 (×2): 10 mg
  Filled 2021-06-15 (×2): qty 1

## 2021-06-15 MED ORDER — TRAZODONE HCL 50 MG PO TABS
25.0000 mg | ORAL_TABLET | Freq: Every evening | ORAL | Status: DC | PRN
  Filled 2021-06-15: qty 1

## 2021-06-15 MED ORDER — ENOXAPARIN SODIUM 40 MG/0.4ML IJ SOSY
40.0000 mg | PREFILLED_SYRINGE | INTRAMUSCULAR | Status: DC
  Administered 2021-06-16 – 2021-06-24 (×9): 40 mg via SUBCUTANEOUS
  Filled 2021-06-15 (×10): qty 0.4

## 2021-06-15 NOTE — Progress Notes (Addendum)
Inpatient Rehabilitation Admission Medication Review by a Pharmacist ? ?A complete drug regimen review was completed for this patient to identify any potential clinically significant medication issues. ? ?High Risk Drug Classes Is patient taking? Indication by Medication  ?Antipsychotic Yes Compazine- N/V ?Seroquel- sleep  ?Anticoagulant Yes Lovenox- VTE prophylaxis  ?Antibiotic No   ?Opioid Yes OxyIR- acute pain  ?Antiplatelet No   ?Hypoglycemics/insulin Yes Insulin- T2DM  ?Vasoactive Medication Yes Lasix- hypertension  ?Chemotherapy No   ?Other Yes Valium- anxiety ?Brovana, Pulmicort, Revefenacin- asthma ?Singulair- asthma ?Zonisamide- seizure prophylaxis ?Prevalite, lipitor- HLD ?Trazodone- sleep  ? ? ? ?Type of Medication Issue Identified Description of Issue Recommendation(s)  ?Drug Interaction(s) (clinically significant) ?    ?Duplicate Therapy ?    ?Allergy ?    ?No Medication Administration End Date ?    ?Incorrect Dose ?    ?Additional Drug Therapy Needed ?    ?Significant med changes from prior encounter (inform family/care partners about these prior to discharge).    ?Other ? PTA meds: ?Advair ?Exforge ?Depakote ?Nasonex ?Protonix Restart PTA meds when cliniclly necessary during CIR admission or at time of discharge, if needed  ? ? ?Clinically significant medication issues were identified that warrant physician communication and completion of prescribed/recommended actions by midnight of the next day:  Yes ? ?Secure chat to Risa Grill PA- patient was on Keppra prior to transfer to CIR. Do you want it continued? Thank you ? ?UPDATE- spoke with Katharine Look. Neurology wants this patient to return to zonisamide and discontinue keppra therapy. This has been resolved. Jameshia Hayashida BS, PharmD, BCPS 06/16/21 11:11 AM ? ?Time spent performing this drug regimen review (minutes):  30 ? ? ?Sebastien Jackson BS, PharmD, BCPS ?Clinical Pharmacist ?06/15/2021 1:14 PM ? ?Contact: [phone] after 3 PM ? ?"Be curious, not  judgmental..." -Jamal Maes ?

## 2021-06-15 NOTE — Discharge Summary (Addendum)
Physician Discharge Summary  ?Patient ID: ?Cassidy Tran ?MRN: [id number] ?DOB/AGE: [birth year] 48 y.o. ? ?Admit date: 06/05/2021 ?Discharge date: 06/25/2021 ? ?Discharge Diagnoses:  ?Principal Problem: ?  Neuroendocrine carcinoma ([place]) ?Active Problems: ?  Pressure injury of skin ?Post right hemicolectomy ?Respiratory failure ?Tracheostomy ?Post-operative pain ?Volume overload ?ABLA ?Epilepsy ?V-P shunt status ?Von willebrand disease ?Tachycardia ?Right 5th metatarsal fracture ?Diarrhea ? ?Discharged Condition: good ? ?Significant Diagnostic Studies: ?DG Ankle Complete Left ? ?Result Date: 06/24/2021 ?CLINICAL DATA:  Left ankle and foot pain EXAM: LEFT ANKLE COMPLETE - 3+ VIEW COMPARISON:  06/24/2021 FINDINGS: There is no evidence of fracture, dislocation, or joint effusion. There is no evidence of arthropathy or other focal bone abnormality. Soft tissues are unremarkable. IMPRESSION: No acute abnormality Electronically Signed   By: Jerilynn Mages.  Shick M.D.   On: 06/24/2021 16:51  ? ?DG Ankle Complete Right ? ?Result Date: 06/22/2021 ?CLINICAL DATA:  Increasing pain.  No injury. EXAM: RIGHT ANKLE - COMPLETE 3+ VIEW; RIGHT FOOT COMPLETE - 3+ VIEW COMPARISON:  None. FINDINGS: Right ankle: Mild distal medial malleolar degenerative spurring. The ankle mortise is symmetric and intact. Right foot: There is a subtle transverse lucency at the base of fifth metatarsal suggesting an acute to subacute nondisplaced fracture. Mild great toe metatarsophalangeal joint and second through fifth digit interphalangeal joint space narrowing degenerative change. No dislocation. IMPRESSION: Nondisplaced acute to subacute fracture of the base of fifth metatarsal. Electronically Signed   By: Yvonne Kendall M.D.   On: 06/22/2021 12:11  ? ?DG Abd 1 View ? ?Result Date: 06/10/2021 ?CLINICAL DATA:  Nausea and vomiting yesterday. Constant diarrhea today. EXAM: ABDOMEN - 1 VIEW COMPARISON:  KUB 05/21/2021 FINDINGS: Bowel-gas seen within nondistended loops of  small and large bowel. A likely ventriculoperitoneal shunt catheter overlies the visualized inferior right hemithorax, curls overlying the right upper quadrant of the abdomen, and terminates overlying the right hemisacrum. Interval removal of the prior enteric tube coursing through the stomach and terminating at the junction of the second and third portions of the duodenum. There now appears to be a gastrostomy button overlying the left upper quadrant. No acute skeletal abnormality. IMPRESSION:: IMPRESSION: 1. Interval removal of prior enteric tube and likely new gastrostomy button overlying the left upper abdominal quadrant. 2. Nonobstructed bowel-gas pattern. Electronically Signed   By: Yvonne Kendall M.D.   On: 06/10/2021 12:05  ? ?CT HEAD WO CONTRAST (5MM) ? ?Result Date: 06/11/2021 ?CLINICAL DATA:  Fall with orbital trauma, left periorbital pain. EXAM: CT HEAD WITHOUT CONTRAST TECHNIQUE: Contiguous axial images were obtained from the base of the skull through the vertex without intravenous contrast. RADIATION DOSE REDUCTION: This exam was performed according to the departmental dose-optimization program which includes automated exposure control, adjustment of the mA and/or kV according to patient size and/or use of iterative reconstruction technique. COMPARISON:  05/13/2021 FINDINGS: Brain: No acute intracranial hemorrhage, midline shift or mass effect. No extra-axial fluid collection. A ventriculoperitoneal shunt enters the occipital region on the right and terminates in the frontal lobe on the left, unchanged. The ventricles are stable in size and configuration. No hydrocephalus. Vascular: No hyperdense vessel or unexpected calcification. Skull: No acute fracture. Prior Chiari decompression is noted with stable appearance of the foramen magnum. Sinuses/Orbits: Mucosal thickening is present in the maxillary sinuses bilaterally. No acute orbital abnormality. Other: None. IMPRESSION: Stable chronic findings with  no evidence of acute intracranial hemorrhage. Electronically Signed   By: Brett Fairy M.D.   On: 06/11/2021 23:24  ? ?DG ABDOMEN PEG  TUBE LOCATION ? ?Result Date: 06/17/2021 ?CLINICAL DATA:  Peg tube check EXAM: ABDOMEN - 1 VIEW COMPARISON:  06/10/2021 FINDINGS: 30 cc Gastrografin contrast injected through the patient's gastrostomy tube. The tube projects over the body of the stomach. Contrast opacifies the fundus and proximal body of the stomach. No gross extravasation. Nonobstructed gas pattern. Right-sided shunt tubing. IMPRESSION: Gastrostomy tube projects over the body of the stomach. No gross extravasation. Electronically Signed   By: Donavan Foil M.D.   On: 06/17/2021 21:22  ? ?DG Chest Port 1 View ? ?Result Date: 06/05/2021 ?CLINICAL DATA:  Respiratory failure EXAM: PORTABLE CHEST 1 VIEW COMPARISON:  Chest x-rays dated 06/04/2021 and 06/03/2021. FINDINGS: Tracheostomy tube appears appropriately positioned in the midline with tip above the level of the carina. RIGHT-sided PICC line is well positioned with tip at the level of the RIGHT atrium. Heart size and mediastinal contours are stable. Patchy bilateral interstitial and alveolar airspace opacities are unchanged, basilar predominant. No new lung findings. No pneumothorax is seen. IMPRESSION: 1. Stable chest x-ray. No change of the bilateral interstitial and alveolar airspace opacities compatible with multifocal pneumonia and/or edema. 2. Support apparatus appears appropriately positioned. Electronically Signed   By: Franki Cabot M.D.   On: 06/05/2021 09:22  ? ?DG CHEST PORT 1 VIEW ? ?Result Date: 06/04/2021 ?CLINICAL DATA:  Follow-up exam. History of neuroendocrine carcinoma. EXAM: PORTABLE CHEST 1 VIEW COMPARISON:  06/03/2021 and older studies. FINDINGS: Bilateral interstitial and patchy airspace lung opacities are without significant change. No new lung abnormalities. Tracheostomy tube and right PICC are stable. No pneumothorax. IMPRESSION: 1. No change  from the previous day's exam. 2. Stable bilateral interstitial and patchy airspace lung opacities. Stable support apparatus. Electronically Signed   By: Lajean Manes M.D.   On: 06/04/2021 15:04  ? ?DG Chest Port 1 View ? ?Result Date: 06/03/2021 ?CLINICAL DATA:  Neuroendocrine carcinoma.  Respiratory failure EXAM: PORTABLE CHEST 1 VIEW COMPARISON:  Three days ago FINDINGS: Tracheostomy tube in place. Right PICC with tip at the SVC. VP shunt catheter traversing the right chest. Patchy bilateral pulmonary infiltrate. Generous heart size which is stable. No visible effusion or pneumothorax. IMPRESSION: Unchanged hardware positioning and pulmonary infiltrates. Electronically Signed   By: Jorje Guild M.D.   On: 06/03/2021 07:55  ? ?DG Chest Port 1 View ? ?Result Date: 05/31/2021 ?CLINICAL DATA:  Respiratory failure. EXAM: PORTABLE CHEST 1 VIEW COMPARISON:  05/30/2021 FINDINGS: Tracheostomy tube overlies the airway. A right PICC terminates near the superior cavoatrial junction. Shunt tubing courses through the right chest and into the abdomen. The cardiomediastinal silhouette is unchanged. Widespread interstitial and airspace opacities in both lungs have not significantly changed. No large pleural effusion or pneumothorax is identified. IMPRESSION: Unchanged extensive airspace disease. Electronically Signed   By: Logan Bores M.D.   On: 05/31/2021 08:03  ? ?DG CHEST PORT 1 VIEW ? ?Result Date: 05/30/2021 ?CLINICAL DATA:  Status post tracheostomy EXAM: PORTABLE CHEST 1 VIEW COMPARISON:  Radiograph 05/28/2021, chest CT 05/23/2021 FINDINGS: Tracheostomy tube overlies the midthoracic trachea. Right upper extremity PICC tip overlies the distal superior vena cava. Shunt tubing courses down the right chest and right hemiabdomen. Unchanged cardiomediastinal silhouette. Low lung volumes. No significant change in diffuse airspace disease. No acute osseous abnormality. IMPRESSION: Tracheostomy tube overlies the midthoracic trachea.  No significant interval change in diffuse airspace disease. Electronically Signed   By: Maurine Simmering M.D.   On: 05/30/2021 17:17  ? ?DG CHEST PORT 1 VIEW ? ?Result Date: 05/28/2021 ?CLINICAL  DATA:  46 year old

## 2021-06-15 NOTE — H&P (Incomplete)
? ? ?Physical Medicine and Rehabilitation Admission H&P ? ?  ?CC: Debility secondary to right hemicolectomy, subsequent postoperative respiratory failure ? ?HPI: Cassidy Tran is a 46 year old female admitted on 05/05/2021 for planned right hemicolectomy secondary to neuroendocrine tumor.  She also had diagnosis of biliary dyskinesia and underwent concomitant cholecystectomy.  She has a history of von Willebrand's disease and was premedicated with DDAVP.  She tolerated procedure well.  She developed respiratory distress and volume overload on 3/14 and was transferred to the intensive care unit.  She was found to have an elevated white blood cell count and developed encephalopathy.  Neurosurgery was consulted due to the patient's history of VP shunt secondary in 2018 to Chiari malformation.  Lumbar puncture was performed without signs of infection.  She was treated with multiple antibiotics.  She ultimately required intubation on 3/16.  Required PEG placement.  Neurology consulted and her encephalopathy and history of epilepsy.  Patient's home regimen was Depakote ER 2000 mg twice daily and zonisamide 100 mg twice daily.  Per patient's husband's report, patient was not compliant with these medications on a regular basis.  Encephalopathy most likely due to hyperammonemia in the setting of Depakote use.  Recommendations are to transition Keppra to zonisamide once taking p.o. intake.  The patient has currently been advanced to regular diet and receiving tube feeds at nighttime.The patient requires inpatient medicine and rehabilitation evaluations and services for ongoing dysfunction secondary to debility related to postoperative respiratory failure, right hemicolectomy, encephalopathy. ? ?Patient's husband says he gets her up approximately every 2-3 hours to urinate but staff not measuring output ? ? ?Husband and father at bedside.  ?+ tobacco use; denies alcohol use ? ?Review of Systems  ?Constitutional:  Negative for  chills and fever.  ?HENT:  Negative for hearing loss and sore throat.   ?Eyes:  Negative for double vision.  ?Respiratory:  Negative for cough and sputum production.   ?Cardiovascular:  Negative for chest pain and palpitations.  ?Gastrointestinal:  Negative for abdominal pain, diarrhea and vomiting.  ?     Last BM 2-3 days ago  ?Genitourinary:  Positive for urgency.  ?     Occasional overflow Urinary incontinence  ?Neurological:  Negative for dizziness and headaches.  ?Psychiatric/Behavioral:  The patient is nervous/anxious.   ?Past Medical History:  ?Diagnosis Date  ? Acid reflux   ? Allergy   ? Anxiety   ? Arthritis   ? lower back  ? ASCUS (atypical squamous cells of undetermined significance) on Pap smear 03/2011  ? NEG HR HPV  ? Asthma   ? Cancer [facility])   ? skin cancer- age 36ish  ? Cervical dysplasia, mild 08/2010  ? LGSIL colposcopy biopsy showing koilocytotic atypia  ? Clotting disorder ([place])   ? Depression   ? Endometriosis   ? Headache(784.0)   ? High risk HPV infection 12/2011  ? Pap normal  ? Hypertension   ? Pneumonia   ? 2016ish  ? Seizures ([place])   ? seizure disorder  ? Smoker   ? Von Willebrand disease ([place])   ? ?Past Surgical History:  ?Procedure Laterality Date  ? APPLICATION OF CRANIAL NAVIGATION N/A 11/24/2016  ? Procedure: APPLICATION OF CRANIAL NAVIGATION;  Surgeon: Kristeen Miss, MD;  Location: [facility];  Service: Neurosurgery;  Laterality: N/A;  ? BRAIN SURGERY    ? BREAST BIOPSY Left   ? CHOLECYSTECTOMY N/A 05/05/2021  ? Procedure: CHOLECYSTECTOMY;  Surgeon: [name], MD;  Location: [facility];  Service: General;  Laterality: N/A;  ? COLON RESECTION N/A 05/05/2021  ? Procedure: RIGHT HEMICOLECTOMY;  Surgeon: [name], MD;  Location: [facility];  Service: General;  Laterality: N/A;  ? COLPOSCOPY    ? CYSTOSCOPY N/A 10/14/2012  ? Procedure: CYSTOSCOPY;  Surgeon: [name], MD;  Location: [facility];  Service: Gynecology;  Laterality: N/A;  ? DILATION AND CURETTAGE OF UTERUS    ? HYSTEROSCOPY  WITH D & C  10/14/2010  ? Procedure: DILATATION AND CURETTAGE (D&C) /HYSTEROSCOPY;  Surgeon: [name], MD;  Location: [facility];  Service: Gynecology;  Laterality: N/A;  ? LAPAROSCOPIC HYSTERECTOMY N/A 10/14/2012  ? Procedure: HYSTERECTOMY TOTAL LAPAROSCOPIC;  Surgeon: [name], MD;  Location: [facility];  Service: Gynecology;  Laterality: N/A;  CPT [phone] ? ?2 1/2 hours ? ?Dr. Uvaldo Rising to assist.  ? LAPAROSCOPY  04/27/2011  ? Procedure: LAPAROSCOPY OPERATIVE;  Surgeon: [name], MD;  Location: [facility];  Service: Gynecology;  Laterality: N/A;  removal right cyst , lysis of adhesions, biopsy of peritoneum  ? LAPAROTOMY N/A 05/05/2021  ? Procedure: EXPLORATORY LAPAROTOMY;  Surgeon: [name], MD;  Location: [facility];  Service: General;  Laterality: N/A;  ? [place]  ? IN [place], [place]  ? PEG PLACEMENT N/A 05/30/2021  ? Procedure: PERCUTANEOUS ENDOSCOPIC GASTROSTOMY (PEG) PLACEMENT;  Surgeon: [name], MD;  Location: [facility];  Service: General;  Laterality: N/A;  ? SUBOCCIPITAL CRANIECTOMY CERVICAL LAMINECTOMY N/A 11/06/2016  ? Procedure: Suboccipital decompression for chiari malformation;  Surgeon: Kristeen Miss, MD;  Location: [facility];  Service: Neurosurgery;  Laterality: N/A;  ? TONSILLECTOMY    ? TRACHEOSTOMY TUBE PLACEMENT N/A 05/30/2021  ? Procedure: TRACHEOSTOMY;  Surgeon: [name], MD;  Location: [facility];  Service: General;  Laterality: N/A;  ? VENTRICULOPERITONEAL SHUNT N/A 11/24/2016  ? Procedure: SHUNT INSERTION VENTRICULAR-PERITONEAL with Brainlab;  Surgeon: Kristeen Miss, MD;  Location: [facility];  Service: Neurosurgery;  Laterality: N/A;  right side approach  ? [place] EXTRACTION  1993  ? ?Family History  ?Problem Relation Age of Onset  ? Hypertension Mother   ? Heart disease Mother   ? Hyperlipidemia Mother   ? Diabetes Father   ? Hypertension Father   ? Heart disease Father   ? Hyperlipidemia Father   ? Stroke Father   ? Prostate cancer Father   ? Lymphoma  Sister   ?     NON-HODGKINS LYMPHOMA  ? Cancer Maternal Uncle   ?     Lung and pancreatic cancer  ? Colon cancer Neg Hx   ? Rectal cancer Neg Hx   ? Esophageal cancer Neg Hx   ? ?Social History:  reports that she has been smoking cigarettes. She has a 10.00 pack-year smoking history. She has never used smokeless tobacco. She reports that she does not currently use alcohol. She reports that she does not use drugs. ?Allergies:  ?Allergies  ?Allergen Reactions  ? Aspirin Other (See Comments)  ?  Childhood reaction  ? Codeine Nausea And Vomiting  ?  Tolerates Hydrocodone  ? Erythromycin Other (See Comments)  ?  Childhood reaction, tolerate Zpak ?Childhood reaction does not know.  ? Lactose Intolerance ([place]) Diarrhea  ? Nsaids Nausea And Vomiting  ? Penicillins Other (See Comments)  ?  Has patient had a PCN reaction causing immediate rash, facial/tongue/throat swelling, SOB or lightheadedness with hypotension: doesn't remember childhood Reaction  ?Has patient had a PCN reaction causing severe rash involving  mucus membranes or skin necrosis: NO ?Has patient had a PCN reaction that required hospitalization NO ?Has patient had a PCN reaction occurring within the last 10 years: NO ?If all of the above answers are "NO", then may proceed with Cephalosporin use. ?  ? ?Medications Prior to Admission  ?Medication Sig Dispense Refill  ? acetaminophen (TYLENOL) 500 MG tablet Take 1,000 mg by mouth every 6 (six) hours as needed for mild pain or moderate pain. pain     ? ADVAIR HFA 230-21 MCG/ACT inhaler INHALE 2 PUFFS BY MOUTH INTO THE LUNGS TWICE DAILY 12 g 0  ? albuterol (PROVENTIL) (2.5 MG/3ML) 0.083% nebulizer solution Take 3 mLs (2.5 mg total) by nebulization every 6 (six) hours as needed for wheezing or shortness of breath. 75 mL 1  ? amLODipine-valsartan (EXFORGE) 10-320 MG tablet Take 1 tablet by mouth daily.    ? atorvastatin (LIPITOR) 20 MG tablet Take 20 mg by mouth daily.    ? divalproex (DEPAKOTE ER) 500 MG 24 hr tablet  Take 500-1,000 mg by mouth See admin instructions. Take 500 mg by mouth in the morning and 1000 mg at night    ? Melatonin 10 MG TABS Take 1 tablet by mouth at bedtime.    ? mometasone (NASONEX) 50

## 2021-06-15 NOTE — Discharge Summary (Signed)
? ? ?  Patient ID: ?Cassidy Tran ?[id number] ?1976/02/06 46 y.o. ? ?Admit date: 05/05/2021 ?Discharge date: 06/15/2021 ? ?Admitting Diagnosis: ?Neuroendocrine tumor, biliary dyskinesia ? ?Discharge Diagnosis ?Patient Active Problem List  ? Diagnosis Date Noted  ? Pressure injury of skin 06/01/2021  ? Neuroendocrine carcinoma ([place]) 05/05/2021  ? Respiratory tract infection 03/26/2018  ? Acute stress reaction 03/26/2018  ? Panic attack 03/26/2018  ? Severe persistent asthma 03/05/2017  ? Acute non-recurrent frontal sinusitis 03/05/2017  ? Allergic rhinitis 03/05/2017  ? Hyperlipidemia 03/05/2017  ? Vaccine refused by patient 03/05/2017  ? Communicating hydrocephalus ([place]) 11/24/2016  ? Pressure in head 11/21/2016  ? Chiari malformation type I ([place]) 11/06/2016  ? Chiari I malformation ([place]) 11/06/2016  ? Von Willebrand disease type IA ([place])   ? Neck pain 08/08/2016  ? Acute pain of right shoulder 08/08/2016  ? Upper back pain on right side 08/08/2016  ? Muscle spasm 08/08/2016  ? Spinal stenosis in cervical region 08/08/2016  ? Von Willebrand disease ([place]) 10/28/2010  ? Asthmatic bronchitis 10/28/2010  ? Seizure disorder ([place]) 10/28/2010  ? Anxiety 10/28/2010  ? Smoking 10/28/2010  ? Arthritis 10/28/2010  ? Headache(784.0) 10/28/2010  ? Blood dyscrasia 10/28/2010  ? ? ?Consultants ?Pulmonology, Oncology ? ?Reason for Admission: ?Neuroendocrine tumor on colonoscopy  ? ?Procedures ?exlap, R hemicolectomy, cholecystectomy 3/9 ? ?Hospital Course:  ?Complicated by respiratory failure 2/2 volume overload/?PNA - Intubated 3/16, pulm c/s 4/1, brovana, yupelri, singulair added. Trach 4/4, downsize to [facility] 4/11, now capped Also had volume overload - now euvolemic, lasix 80BID, prior altered mental status - most likely depakote induced hyperammonemic encephalopathy. Neurology on board, changed to keppra, cont EEG without sz, encephalopathy resolved. Well differentiated neuroendocrine tumor with 1/7 +nodes. D/w Dr. Marin Olp 3/15 will  likely need adjuvant chemo. Cleared to start chemo from surgical perspective.   ? ?Physical Exam: ?Gen: comfortable, no distress ?Neuro: non-focal exam ?HEENT: PERRL ?Neck: supple, trached with cap in place ?CV: RRR ?Pulm: unlabored breathing  ?Abd: soft, NT ?GU: clear yellow urine ?Extr: wwp, no edema ?  ? ? ? ? ? ? Follow-up Information   ? ? [name], MD. Schedule an appointment as soon as possible for a visit today.   ?Specialty: Surgery ?Why: For wound re-check ?Contact information: ?[address] ?[address] ?[facility] ?[place] [address] ?[phone] ? ? ?  ?  ? ? Volanda Napoleon, MD. Schedule an appointment as soon as possible for a visit.   ?Specialty: Oncology ?Contact information: ?[address] ?[place] [address] ?[phone] ? ? ?  ?  ? ? Icard, Bradley L, DO. Schedule an appointment as soon as possible for a visit.   ?Specialty: Pulmonary Disease ?Contact information: ?[address] ?[address] ?[place] [address] ?[phone] ? ? ?  ?  ? ?  ?  ? ?  ? ? ? ?Signed: ?[name], MD ?[facility] ?06/15/2021, 11:56 AM ? ?

## 2021-06-15 NOTE — Progress Notes (Signed)
Izora Ribas, MD  ?Physician ?Physical Medicine and Rehabilitation ?PMR Pre-admission     ?Signed ?Date of Service:  06/14/2021  3:50 PM ? Related encounter: Admission (Current) from 05/05/2021 in [facility] ?  ?Signed    ?  ?Show:Clear all ?_0 Written_1 Templated_2 Copied ? ?Added by: ?_3 Cristina Gong, RN_4 Izora Ribas, MD ? ?_5 Hover for details ?   ?   ?   ?   ?   ?   ?   ?   ?   ?   ?   ?   ?   ?   ?   ?   ?   ?   ?   ?   ?   ?   ?   ?   ?   ?   ?   ?   ?   ?   ?   ?   ?   ?   ?   ?   ?   ?   ?   ?   ?   ?   ?   ?   ?   ?   ?   ?   ?   ?   ?   ?   ?   ?   ?   ?   ?   ?   ?   ?   ?   ?   ?   ?   ?   ?   ?   ?   ?   ?   ?   ?   ?   ?   ?   ?   ?   ?   ?   ?   ?   ?   ?   ?   ?   ?   ?   ?   ?   ?   ?   ?   ?   ?   ?   ?   ?   ?   ?   ?   ?   ?   ?   ?   ?   ?   ?   ?   ?   ?   ?   ?   ?   ?   ?   ?   ?   ?   ?   ?   ?   ?   ?   ?   ?   ?   ?   ?   ?   ?   ?   ?   ?   ?   ?   ?   ?   ?   ?   ?   ?   ?   ?   ?PMR Admission Coordinator Pre-Admission Assessment ?  ?Patient: Cassidy Tran is an 46 y.o., female ?MRN: [id number] ?DOB: [birth year] ?Height: _6  (160 cm) ?Weight: 88.5 kg ?  ?Insurance Information ?HMO:     PPO:      PCP:      IPA:      80/20:      OTHER:  ?PRIMARY: [employer] POS      Policy#: [id number]      Subscriber: pt ?CM Name: Elta Guadeloupe      Phone#: [phone]     Fax#: [phone] ?Pre-Cert#: [id number] approved for 5 days until 4/23 with updates due  4/24 to Dewitt Rota phone [phone] fax [phone]      Employer:  ?Benefits:  Phone #: [phone]     Name: 4/18 ?Eff. Date: 01/28/2019     Deduct: $3500      Out of Pocket Max: $6500      Life Max: none ?CIR: 80%      SNF: 80% 60 days ?Outpatient: $75 per visit     Co-Pay: visits per medical neccesity ?Home Health: 80%      Co-Pay: 20% ?DME: 50%     Co-Pay: 50% ?Providers: in network ? ?SECONDARY: none ?  ?Financial Counselor:       Phone#:  ?  ?The ?Data Collection Information Summary? for patients in  Inpatient Rehabilitation Facilities with attached ?Privacy Act [place] Records? was provided and verbally reviewed with: Family ?  ?Emergency Contact Information ?Contact Information   ?  ?  Name Relation Home Work Mobile  ?  Sadik,[name] Spouse [phone]   [phone]  ?  Nech[place] Guard Mother [phone]   [phone]  ?  Schlosser,Steve Father [phone]      ?  ?   ?  ?Current Medical History  ?Patient Admitting Diagnosis: Neuroendocrine tumor s/p surgical removal ?  ?History of Present Illness: 46 year old with biopsy proven neuroendocrine tumor in the terminal ileum on colonoscopy. She had undergone colonoscopy due to symptoms of watery diarrhea revealed during evaluation for biliary disease. She has a history of Chiari malformation s/p surgery and is s/p VP shunt placement for hydrocephalus 10/2016. She also has a history of von Willebrand disease type 1A. Additional medial history of Arthritis, Asthma, COPD, seizures, and biliary dyskinesia. ?  ?Presented for surgery on 05/05/2021. Underwent exploratory lap, right hemicolectomy and cholecystectomy on 3/9. Well differentiated neuroendocrine tumor with 1/7 + nodes. Dr Marin Olp consulted and felt likely will need adjuvant chemo. Respiratory failure ad intubated on 3/16 suspected PNA and treated. Trached 4/4/ and now downsized to 4 uncuffed and capped. Lasix transitioned to po for treatment of volume overload. Altered mental status with CT normal with history of VP shunt. Felt most likely Depakote induced hyperammonemic encephalopathy. Changed to Keppra, EEG without seizures. Encephalopathy resolved. Receiving nocturnal tube feeds via PEG with Cortrak removed. TPN discontinued. PEG placed on 4/3. Diet advanced to Regular diet with dietician following for supplements and Tube feeds. On valium for anxiety prn and Seroquel at HS only.  ?  ?Patient's [facility] record from [facility] has been reviewed by the rehabilitation admission  coordinator and physician. ?  ?Past Medical History  ?    ?Past Medical History:  ?Diagnosis Date  ? Acid reflux    ? Allergy    ? Anxiety    ? Arthritis    ?  lower back  ? ASCUS (atypical squamous cells of undetermined significance) on Pap smear 03/2011  ?  NEG HR HPV  ? Asthma    ? Cancer [facility])    ?  skin cancer- age 35ish  ? Cervical dysplasia, mild 08/2010  ?  LGSIL colposcopy biopsy showing koilocytotic atypia  ? Clotting disorder ([place])    ? Depression    ? Endometriosis    ? Headache(784.0)    ? High risk HPV infection 12/2011  ?  Pap normal  ? Hypertension    ? Pneumonia    ?  2016ish  ? Seizures ([place])    ?  seizure disorder  ? Smoker    ? Von Willebrand disease ([place])    ?  ?  Has the patient had major surgery during 100 days prior to admission? Yes ?  ?Family History   ?family history includes Cancer in her maternal uncle; Diabetes in her father; Heart disease in her father and mother; Hyperlipidemia in her father and mother; Hypertension in her father and mother; Lymphoma in her sister; Prostate cancer in her father; Stroke in her father. ?  ?Current Medications ?  ?Current Facility-Administered Medications:  ?  acetaminophen (TYLENOL) tablet 1,000 mg, 1,000 mg, Per Tube, Q6H, Maczis, Barth Kirks, PA-C, 1,000 mg at 06/15/21 0017 ?  albuterol (PROVENTIL) (2.5 MG/3ML) 0.083% nebulizer solution 2.5 mg, 2.5 mg, Nebulization, Q4H PRN, Jillyn Ledger, PA-C, 2.5 mg at 05/31/21 0453 ?  arformoterol (BROVANA) nebulizer solution 15 mcg, 15 mcg, Nebulization, BID, Jillyn Ledger, PA-C, 15 mcg at 06/15/21 4944 ?  atorvastatin (LIPITOR) tablet 20 mg, 20 mg, Per Tube, Daily, Maczis, Barth Kirks, PA-C, 20 mg at 06/15/21 9675 ?  budesonide (PULMICORT) nebulizer solution 0.25 mg, 0.25 mg, Nebulization, BID, Jillyn Ledger, PA-C, 0.25 mg at 06/15/21 9163 ?  chlorhexidine gluconate (MEDLINE KIT) (PERIDEX) 0.12 % solution 15 mL, 15 mL, Mouth Rinse, BID, Jillyn Ledger, PA-C, 15 mL at 06/15/21 0827 ?  Chlorhexidine  Gluconate Cloth 2 % PADS 6 each, 6 each, Topical, Q0600, Jillyn Ledger, PA-C, 6 each at 06/15/21 0615 ?  cholestyramine light (PREVALITE) packet 4 g, 4 g, Oral, TID, [name], MD, 4 g at 06/14/21 2125 ?  diazepam (VALIUM) tablet 4 mg, 4 mg, Oral, Q8H, Lovick, Montel Culver, MD, 4 mg at 06/15/21 8466 ?  enoxaparin (LOVENOX) injection 40 mg, 40 mg, Subcutaneous, Q24H, Maczis, Barth Kirks, PA-C, 40 mg at 06/15/21 0827 ?  feeding supplement (OSMOLITE 1.5 CAL) liquid 1,000 mL, 1,000 mL, Per Tube, Q24H, [name], MD, 1,000 mL at 06/14/21 1826 ?  feeding supplement (PROSource TF) liquid 45 mL, 45 mL, Per Tube, BID, Lovick, Montel Culver, MD, 45 mL at 06/15/21 0825 ?  furosemide (LASIX) tablet 80 mg, 80 mg, Per Tube, BID, [name], MD, 80 mg at 06/15/21 0826 ?  Gerhardt's butt cream, , Topical, PRN, Maczis, Barth Kirks, PA-C, 2 application. at 06/10/21 1659 ?  guaiFENesin (ROBITUSSIN) 100 MG/5ML liquid 10 mL, 10 mL, Per Tube, Q4H, [name], MD, 10 mL at 06/15/21 5993 ?  insulin aspart (novoLOG) injection 0-9 Units, 0-9 Units, Subcutaneous, Q4H, Jillyn Ledger, PA-C, 1 Units at 06/15/21 5701 ?  levETIRAcetam (KEPPRA) 100 MG/ML solution 750 mg, 750 mg, Per Tube, BID, Jillyn Ledger, PA-C, 750 mg at 06/15/21 0825 ?  loperamide HCl (IMODIUM) 1 MG/7.5ML suspension 4 mg, 4 mg, Per Tube, Q8H, [name], MD, 4 mg at 06/15/21 7793 ?  meclizine (ANTIVERT) tablet 25 mg, 25 mg, Per Tube, TID PRN, Jillyn Ledger, PA-C ?  MEDLINE mouth rinse, 15 mL, Mouth Rinse, 10 times per day, Jillyn Ledger, PA-C, 15 mL at 06/15/21 0615 ?  midazolam (VERSED) injection 0.5 mg, 0.5 mg, Intravenous, Q8H PRN, [name], MD, 0.5 mg at 06/15/21 0028 ?  montelukast (SINGULAIR) tablet 10 mg, 10 mg, Per Tube, QHS, Maczis, Barth Kirks, PA-C, 10 mg at 06/14/21 2124 ?  ondansetron (ZOFRAN-ODT) disintegrating tablet 4 mg, 4 mg, Oral, Q6H PRN, 4 mg at 06/05/21 2058 **OR** ondansetron (ZOFRAN) injection 4 mg, 4 mg,  Intravenous, Q6H PRN, Jillyn Ledger, PA-C, 4 mg at 06/15/21 9030 ?  oxyCODONE (ROXICODONE) 5 MG/5ML solution 5-10 mg, 5-10 mg,  Per Tube, Q4H PRN, Jillyn Ledger, PA-C, 10 mg at 06/15/21 8403 ?  potassium chlor

## 2021-06-15 NOTE — H&P (Signed)
? ? ?Physical Medicine and Rehabilitation Admission H&P ? ?  ?CC: Debility secondary to right hemicolectomy, subsequent postoperative respiratory failure ? ?HPI: Cassidy Tran is a 46 year old female admitted on 05/05/2021 for planned right hemicolectomy secondary to neuroendocrine tumor.  She also had diagnosis of biliary dyskinesia and underwent concomitant cholecystectomy.  She has a history of von Willebrand's disease and was premedicated with DDAVP.  She tolerated procedure well.  She developed respiratory distress and volume overload on 3/14 and was transferred to the intensive care unit.  She was found to have an elevated white blood cell count and developed encephalopathy.  Neurosurgery was consulted due to the patient's history of VP shunt secondary in 2018 to Chiari malformation.  Lumbar puncture was performed without signs of infection.  She was treated with multiple antibiotics.  She ultimately required intubation on 3/16.  Required PEG placement.  Neurology consulted and her encephalopathy and history of epilepsy.  Patient's home regimen was Depakote ER 2000 mg twice daily and zonisamide 100 mg twice daily.  Per patient's husband's report, patient was not compliant with these medications on a regular basis.  Encephalopathy most likely due to hyperammonemia in the setting of Depakote use.  Recommendations are to transition Keppra to zonisamide once taking p.o. intake.  The patient has currently been advanced to regular diet and receiving tube feeds at nighttime.The patient requires inpatient medicine and rehabilitation evaluations and services for ongoing dysfunction secondary to debility related to postoperative respiratory failure, right hemicolectomy, encephalopathy. She is having abdominal pain when she get IV medications.  ? ?Patient's husband says he gets her up approximately every 2-3 hours to urinate but staff not measuring output ? ? ?Husband and father at bedside.  ?+ tobacco use; denies  alcohol use ? ?Review of Systems  ?Constitutional:  Negative for chills and fever.  ?HENT:  Negative for hearing loss and sore throat.   ?Eyes:  Negative for double vision.  ?Respiratory:  Negative for cough and sputum production.   ?Cardiovascular:  Negative for chest pain and palpitations.  ?Gastrointestinal:  Negative for abdominal pain, diarrhea and vomiting.  ?     Last BM 2-3 days ago  ?Genitourinary:  Positive for urgency.  ?     Occasional overflow Urinary incontinence  ?Neurological:  Negative for dizziness and headaches.  ?Psychiatric/Behavioral:  The patient is nervous/anxious.   ?Past Medical History:  ?Diagnosis Date  ? Acid reflux   ? Allergy   ? Anxiety   ? Arthritis   ? lower back  ? ASCUS (atypical squamous cells of undetermined significance) on Pap smear 03/2011  ? NEG HR HPV  ? Asthma   ? Cancer [facility])   ? skin cancer- age 62ish  ? Cervical dysplasia, mild 08/2010  ? LGSIL colposcopy biopsy showing koilocytotic atypia  ? Clotting disorder ([place])   ? Depression   ? Endometriosis   ? Headache(784.0)   ? High risk HPV infection 12/2011  ? Pap normal  ? Hypertension   ? Pneumonia   ? 2016ish  ? Seizures ([place])   ? seizure disorder  ? Smoker   ? Von Willebrand disease ([place])   ? ?Past Surgical History:  ?Procedure Laterality Date  ? APPLICATION OF CRANIAL NAVIGATION N/A 11/24/2016  ? Procedure: APPLICATION OF CRANIAL NAVIGATION;  Surgeon: Kristeen Miss, MD;  Location: [place];  Service: Neurosurgery;  Laterality: N/A;  ? BRAIN SURGERY    ? BREAST BIOPSY Left   ? CHOLECYSTECTOMY N/A 05/05/2021  ? Procedure: CHOLECYSTECTOMY;  Surgeon:  [name], MD;  Location: [place];  Service: General;  Laterality: N/A;  ? COLON RESECTION N/A 05/05/2021  ? Procedure: RIGHT HEMICOLECTOMY;  Surgeon: [name], MD;  Location: [place];  Service: General;  Laterality: N/A;  ? COLPOSCOPY    ? CYSTOSCOPY N/A 10/14/2012  ? Procedure: CYSTOSCOPY;  Surgeon: [name], MD;  Location: [facility];  Service: Gynecology;   Laterality: N/A;  ? DILATION AND CURETTAGE OF UTERUS    ? HYSTEROSCOPY WITH D & C  10/14/2010  ? Procedure: DILATATION AND CURETTAGE (D&C) /HYSTEROSCOPY;  Surgeon: [name], MD;  Location: [facility];  Service: Gynecology;  Laterality: N/A;  ? LAPAROSCOPIC HYSTERECTOMY N/A 10/14/2012  ? Procedure: HYSTERECTOMY TOTAL LAPAROSCOPIC;  Surgeon: [name], MD;  Location: [facility];  Service: Gynecology;  Laterality: N/A;  CPT [phone] ? ?2 1/2 hours ? ?Dr. Uvaldo Rising to assist.  ? LAPAROSCOPY  04/27/2011  ? Procedure: LAPAROSCOPY OPERATIVE;  Surgeon: [name], MD;  Location: [facility];  Service: Gynecology;  Laterality: N/A;  removal right cyst , lysis of adhesions, biopsy of peritoneum  ? LAPAROTOMY N/A 05/05/2021  ? Procedure: EXPLORATORY LAPAROTOMY;  Surgeon: [name], MD;  Location: [place];  Service: General;  Laterality: N/A;  ? [place]  ? IN [place], [place]  ? PEG PLACEMENT N/A 05/30/2021  ? Procedure: PERCUTANEOUS ENDOSCOPIC GASTROSTOMY (PEG) PLACEMENT;  Surgeon: [name], MD;  Location: [place];  Service: General;  Laterality: N/A;  ? SUBOCCIPITAL CRANIECTOMY CERVICAL LAMINECTOMY N/A 11/06/2016  ? Procedure: Suboccipital decompression for chiari malformation;  Surgeon: Kristeen Miss, MD;  Location: [place];  Service: Neurosurgery;  Laterality: N/A;  ? TONSILLECTOMY    ? TRACHEOSTOMY TUBE PLACEMENT N/A 05/30/2021  ? Procedure: TRACHEOSTOMY;  Surgeon: [name], MD;  Location: [place];  Service: General;  Laterality: N/A;  ? VENTRICULOPERITONEAL SHUNT N/A 11/24/2016  ? Procedure: SHUNT INSERTION VENTRICULAR-PERITONEAL with Brainlab;  Surgeon: Kristeen Miss, MD;  Location: [place];  Service: Neurosurgery;  Laterality: N/A;  right side approach  ? [place] EXTRACTION  1993  ? ?Family History  ?Problem Relation Age of Onset  ? Hypertension Mother   ? Heart disease Mother   ? Hyperlipidemia Mother   ? Diabetes Father   ? Hypertension Father   ? Heart disease Father   ?  Hyperlipidemia Father   ? Stroke Father   ? Prostate cancer Father   ? Lymphoma Sister   ?     NON-HODGKINS LYMPHOMA  ? Cancer Maternal Uncle   ?     Lung and pancreatic cancer  ? Colon cancer Neg Hx   ? Rectal cancer Neg Hx   ? Esophageal cancer Neg Hx   ? ?Social History:  reports that she has been smoking cigarettes. She has a 10.00 pack-year smoking history. She has never used smokeless tobacco. She reports that she does not currently use alcohol. She reports that she does not use drugs. ?Allergies:  ?Allergies  ?Allergen Reactions  ? Aspirin Other (See Comments)  ?  Childhood reaction  ? Codeine Nausea And Vomiting  ?  Tolerates Hydrocodone  ? Erythromycin Other (See Comments)  ?  Childhood reaction, tolerate Zpak ?Childhood reaction does not know.  ? Lactose Intolerance (Gi) Diarrhea  ? Nsaids Nausea And Vomiting  ? Penicillins Other (See Comments)  ?  Has patient had a PCN reaction causing immediate rash, facial/tongue/throat swelling, SOB or lightheadedness with hypotension: doesn't remember childhood  Reaction  ?Has patient had a PCN reaction causing severe rash involving mucus membranes or skin necrosis: NO ?Has patient had a PCN reaction that required hospitalization NO ?Has patient had a PCN reaction occurring within the last 10 years: NO ?If all of the above answers are "NO", then may proceed with Cephalosporin use. ?  ? ?Medications Prior to Admission  ?Medication Sig Dispense Refill  ? acetaminophen (TYLENOL) 500 MG tablet Take 1,000 mg by mouth every 6 (six) hours as needed for mild pain or moderate pain. pain     ? ADVAIR HFA 230-21 MCG/ACT inhaler INHALE 2 PUFFS BY MOUTH INTO THE LUNGS TWICE DAILY 12 g 0  ? albuterol (PROVENTIL) (2.5 MG/3ML) 0.083% nebulizer solution Take 3 mLs (2.5 mg total) by nebulization every 6 (six) hours as needed for wheezing or shortness of breath. 75 mL 1  ? amLODipine-valsartan (EXFORGE) 10-320 MG tablet Take 1 tablet by mouth daily.    ? atorvastatin (LIPITOR) 20 MG  tablet Take 20 mg by mouth daily.    ? desmopressin (DDAVP NASAL) 0.01 % solution Place 1 spray (10 mcg total) into the nose daily. Administer one spray morning of procedure and again the morning after her procedure. (Patie

## 2021-06-15 NOTE — Progress Notes (Signed)
INPATIENT REHABILITATION ADMISSION NOTE ? ? ?Arrival Method: Via bed from 4NP ? ?   ?Mental Orientation: A/Ox4 ? ? ?Assessment: Complete  ? ? ?Skin: See LDA ? ? ?IV'S: R upper arm PICC ? ? ?Pain: None ? ? ?Tubes and Drains: Peg tube ? ? ?Safety Measures: Bed alarm on , call bell within reach  ? ? ?Vital Signs: Complete ? ? ?Height and Weight: See flowsheet ? ? ?Rehab Orientation: No questions or concerns at this time ? ? ?Family: Notified and at bedside ? ? ? Arn Medal LPN ?

## 2021-06-15 NOTE — Progress Notes (Signed)
Occupational Therapy Treatment ?Patient Details ?Name: Cassidy Tran ?MRN: [id number] ?DOB: May 10, 1975 ?Today's Date: 06/15/2021 ? ? ?History of present illness 46 y.o. female presents to [facility] hospital on 05/05/2021 and underwent ex-lap with cholecystectomy and R hemicolectomy for neuroendocrine tumor. Pt developed respiratory failure due to volume overload and required intubation on 3/16. Pt developed AMS during admission with rising white cell counts, underwent lumbar punture on 3/16. Pt underwent bronchoscopy on 3/21. Bronchoscopy 4/1, tracheostomy 4/3. PMH includes ASCUS, clotting disorder, seizures, PNA, Von Willebrand disease, VP shunt placement. ?  ?OT comments ? Cassidy Tran is progressing incrementally but still remains limited by her O2 desaturating with all mobility attempts. Overall she was min A for bed mobility and min G for transfers and short ambulation with RW. Pt's O2 sat dropped to 80% on 3L, and she required sitting rest break and ~5 minutes to recover to >90% SpO2. Pt required max multimodal cues for PLB and slow and controled breathing with no carry over. Re-educated on incentive spirometer and encouraged regular use, pt verbalized good understanding. D/c remains appropriate.   ? ?Recommendations for follow up therapy are one component of a multi-disciplinary discharge planning process, led by the attending physician.  Recommendations may be updated based on patient status, additional functional criteria and insurance authorization. ?   ?Follow Up Recommendations ? Acute inpatient rehab (3hours/day)  ?  ?Assistance Recommended at Discharge Frequent or constant Supervision/Assistance  ?Patient can return home with the following ? A lot of help with bathing/dressing/bathroom;Assistance with cooking/housework;Assistance with feeding;Direct supervision/assist for medications management;Help with stairs or ramp for entrance;Assist for transportation;Direct supervision/assist for financial management;A lot  of help with walking and/or transfers ?  ?Equipment Recommendations ? BSC/3in1;Tub/shower seat  ?  ?   ?Precautions / Restrictions Precautions ?Precautions: Fall ?Precaution Comments: trach collar- capped, watch HR & O2 ?Restrictions ?Weight Bearing Restrictions: No  ? ? ?  ? ?Mobility Bed Mobility ?Overal bed mobility: Needs Assistance ?Bed Mobility: Supine to Sit ?  ?  ?Supine to sit: Min assist ?  ?  ?General bed mobility comments: pt likely able to perform at a supervision level however husband continues to assist, education provided on importance of being more independent with mobility ?  ? ?Transfers ?Overall transfer level: Needs assistance ?Equipment used: Rolling walker (2 wheels) ?Transfers: Sit to/from Stand ?Sit to Stand: Min guard ?  ?  ?  ?  ?  ?General transfer comment: min G with cues. SpO2 quickly dropped below <88% and needed to sit to recover >90% ?  ?  ?Balance Overall balance assessment: Needs assistance ?Sitting-balance support: Feet supported ?Sitting balance-Leahy Scale: Fair ?Sitting balance - Comments: able to sit EOB with supervision ?  ?Standing balance support: Bilateral upper extremity supported, During functional activity, Reliant on assistive device for balance ?Standing balance-Leahy Scale: Poor ?  ?  ?  ?  ?   ? ?ADL either performed or assessed with clinical judgement  ? ?ADL Overall ADL's : Needs assistance/impaired ?  ?  ?  ?  ?Functional mobility during ADLs: Minimal assistance;Rolling walker (2 wheels) ?General ADL Comments: SpO2 dropped to 80% & required ~5 minutes to recover to >90%. Pt not following commands for PLB technique, demonstrated shallow breaths, seemingingly ignoring cues for slow deep breathing ?  ? ?Extremity/Trunk Assessment Upper Extremity Assessment ?Upper Extremity Assessment: Generalized weakness ?  ?Lower Extremity Assessment ?Lower Extremity Assessment: Defer to PT evaluation ?  ?  ?  ? ?Vision   ?Vision Assessment?: No apparent visual deficits ?   ?  Perception Perception ?Perception: Within Functional Limits ?  ?Praxis Praxis ?Praxis: Intact ?  ? ?Cognition Arousal/Alertness: Awake/alert ?Behavior During Therapy: [facility] for tasks assessed/performed ?Overall Cognitive Status: Impaired/Different from baseline ?Area of Impairment: Following commands, Problem solving ?  ?  ?  ?  ?  ?  ?  ?  ?  ?  ?  ?Following Commands: Follows one step commands consistently ?  ?Awareness: Emergent ?Problem Solving: Slow processing, Decreased initiation, Requires verbal cues ?General Comments: slow processing, needs increased time for command following. is frustrated easily. difficulty following commands for PLB, poor problem solving ?  ?  ?   ?   ?   ?General Comments pt on 3L SpO2, desated with all mobility attempts  ? ? ?Pertinent Vitals/ Pain       Pain Assessment ?Pain Assessment: No/denies pain ?Pain Intervention(s): Monitored during session ? ? ?Frequency ? Min 2X/week  ? ? ? ? ?  ?Progress Toward Goals ? ?OT Goals(current goals can now be found in the care plan section) ? Progress towards OT goals: Progressing toward goals ? ?Acute Rehab OT Goals ?Patient Stated Goal: to rehab today ?OT Goal Formulation: With patient ?Time For Goal Achievement: 06/16/21 ?Potential to Achieve Goals: Good ?ADL Goals ?Pt Will Perform Grooming: with supervision;sitting ?Pt Will Perform Upper Body Dressing: with supervision;sitting ?Pt Will Perform Lower Body Dressing: with min assist;sitting/lateral leans ?Pt Will Transfer to Toilet: with min assist;stand pivot transfer;bedside commode ?Pt/caregiver will Perform Home Exercise Program: Increased strength;Both right and left upper extremity;With Supervision  ?Plan Discharge plan remains appropriate   ? ?   ?AM-PAC OT "6 Clicks" Daily Activity     ?Outcome Measure ? ? Help from another person eating meals?: A Little ?Help from another person taking care of personal grooming?: A Little ?Help from another person toileting, which includes using  toliet, bedpan, or urinal?: A Lot ?Help from another person bathing (including washing, rinsing, drying)?: A Lot ?Help from another person to put on and taking off regular upper body clothing?: A Little ?Help from another person to put on and taking off regular lower body clothing?: A Lot ?6 Click Score: 15 ? ?  ?End of Session Equipment Utilized During Treatment: Gait belt;Rolling walker (2 wheels);Oxygen ? ?OT Visit Diagnosis: Unsteadiness on feet (R26.81);Muscle weakness (generalized) (M62.81);Dizziness and giddiness (R42);Pain ?  ?Activity Tolerance Patient tolerated treatment well ?  ?Patient Left in chair;with family/visitor present;with call bell/phone within reach ?  ?Nurse Communication Mobility status ?  ? ?   ? ?Time: 2952-8413 ?OT Time Calculation (min): 21 min ? ?Charges: OT General Charges ?$OT Visit: 1 Visit ?OT Treatments ?$Self Care/Home Management : 8-22 mins ? ? ?Krishav Mamone A Lesly Joslyn ?06/15/2021, 11:34 AM ?

## 2021-06-15 NOTE — Progress Notes (Addendum)
Inpatient Rehabilitation Admissions Coordinator  ? ?I have insurance approval and CIR bed to admit to today. I contacted Dr Bobbye Morton and she is in agreement. I will alert acute team and TOC , patient and family to make the arrangements. ? ?Danne Baxter, RN, MSN ?Rehab Admissions Coordinator ?[phone] ?06/15/2021 8:50 AM ? ?I met with patient , spouse and Dad at bedside to discuss rehab admit. They are ina agreement. I will make arrangements to admit. ? ?Danne Baxter, RN, MSN ?Rehab Admissions Coordinator ?[phone] ?06/15/2021 10:48 AM ? ? ?

## 2021-06-15 NOTE — Progress Notes (Signed)
Respiratory therapist notified of patient's admission to Rehab. ?

## 2021-06-15 NOTE — Discharge Summary (Incomplete Revision)
Physician Discharge Summary  ?Patient ID: ?Cassidy Tran ?MRN: [id number] ?DOB/AGE: [birth year] 46 y.o. ? ?Admit date: 05/05/2021 ?Discharge date: 06/25/2021 ? ?Discharge Diagnoses:  ?Principal Problem: ?  Neuroendocrine carcinoma ([place]) ?Active Problems: ?  Pressure injury of skin ?Post right hemicolectomy ?Respiratory failure ?Tracheostomy ?Post-operative pain ?Volume overload ?ABLA ?Epilepsy ?V-P shunt status ?Von willebrand disease ?Tachycardia ?Right 5th metatarsal fracture ?Diarrhea ? ?Discharged Condition: good ? ?Significant Diagnostic Studies: ?DG Ankle Complete Right ? ?Result Date: 06/22/2021 ?CLINICAL DATA:  Increasing pain.  No injury. EXAM: RIGHT ANKLE - COMPLETE 3+ VIEW; RIGHT FOOT COMPLETE - 3+ VIEW COMPARISON:  None. FINDINGS: Right ankle: Mild distal medial malleolar degenerative spurring. The ankle mortise is symmetric and intact. Right foot: There is a subtle transverse lucency at the base of fifth metatarsal suggesting an acute to subacute nondisplaced fracture. Mild great toe metatarsophalangeal joint and second through fifth digit interphalangeal joint space narrowing degenerative change. No dislocation. IMPRESSION: Nondisplaced acute to subacute fracture of the base of fifth metatarsal. Electronically Signed   By: Yvonne Kendall M.D.   On: 06/22/2021 12:11  ? ?DG Abd 1 View ? ?Result Date: 06/10/2021 ?CLINICAL DATA:  Nausea and vomiting yesterday. Constant diarrhea today. EXAM: ABDOMEN - 1 VIEW COMPARISON:  KUB 05/21/2021 FINDINGS: Bowel-gas seen within nondistended loops of small and large bowel. A likely ventriculoperitoneal shunt catheter overlies the visualized inferior right hemithorax, curls overlying the right upper quadrant of the abdomen, and terminates overlying the right hemisacrum. Interval removal of the prior enteric tube coursing through the stomach and terminating at the junction of the second and third portions of the duodenum. There now appears to be a gastrostomy button overlying  the left upper quadrant. No acute skeletal abnormality. IMPRESSION:: IMPRESSION: 1. Interval removal of prior enteric tube and likely new gastrostomy button overlying the left upper abdominal quadrant. 2. Nonobstructed bowel-gas pattern. Electronically Signed   By: Yvonne Kendall M.D.   On: 06/10/2021 12:05  ? ?CT HEAD WO CONTRAST (5MM) ? ?Result Date: 06/11/2021 ?CLINICAL DATA:  Fall with orbital trauma, left periorbital pain. EXAM: CT HEAD WITHOUT CONTRAST TECHNIQUE: Contiguous axial images were obtained from the base of the skull through the vertex without intravenous contrast. RADIATION DOSE REDUCTION: This exam was performed according to the departmental dose-optimization program which includes automated exposure control, adjustment of the mA and/or kV according to patient size and/or use of iterative reconstruction technique. COMPARISON:  05/13/2021 FINDINGS: Brain: No acute intracranial hemorrhage, midline shift or mass effect. No extra-axial fluid collection. A ventriculoperitoneal shunt enters the occipital region on the right and terminates in the frontal lobe on the left, unchanged. The ventricles are stable in size and configuration. No hydrocephalus. Vascular: No hyperdense vessel or unexpected calcification. Skull: No acute fracture. Prior Chiari decompression is noted with stable appearance of the foramen magnum. Sinuses/Orbits: Mucosal thickening is present in the maxillary sinuses bilaterally. No acute orbital abnormality. Other: None. IMPRESSION: Stable chronic findings with no evidence of acute intracranial hemorrhage. Electronically Signed   By: Brett Fairy M.D.   On: 06/11/2021 23:24  ? ?DG ABDOMEN PEG TUBE LOCATION ? ?Result Date: 06/17/2021 ?CLINICAL DATA:  Peg tube check EXAM: ABDOMEN - 1 VIEW COMPARISON:  06/10/2021 FINDINGS: 30 cc Gastrografin contrast injected through the patient's gastrostomy tube. The tube projects over the body of the stomach. Contrast opacifies the fundus and proximal  body of the stomach. No gross extravasation. Nonobstructed gas pattern. Right-sided shunt tubing. IMPRESSION: Gastrostomy tube projects over the body of the stomach. No gross extravasation.  Electronically Signed   By: Donavan Foil M.D.   On: 06/17/2021 21:22  ? ?DG Chest Port 1 View ? ?Result Date: 06/05/2021 ?CLINICAL DATA:  Respiratory failure EXAM: PORTABLE CHEST 1 VIEW COMPARISON:  Chest x-rays dated 06/04/2021 and 06/03/2021. FINDINGS: Tracheostomy tube appears appropriately positioned in the midline with tip above the level of the carina. RIGHT-sided PICC line is well positioned with tip at the level of the RIGHT atrium. Heart size and mediastinal contours are stable. Patchy bilateral interstitial and alveolar airspace opacities are unchanged, basilar predominant. No new lung findings. No pneumothorax is seen. IMPRESSION: 1. Stable chest x-ray. No change of the bilateral interstitial and alveolar airspace opacities compatible with multifocal pneumonia and/or edema. 2. Support apparatus appears appropriately positioned. Electronically Signed   By: Franki Cabot M.D.   On: 06/05/2021 09:22  ? ?DG CHEST PORT 1 VIEW ? ?Result Date: 06/04/2021 ?CLINICAL DATA:  Follow-up exam. History of neuroendocrine carcinoma. EXAM: PORTABLE CHEST 1 VIEW COMPARISON:  06/03/2021 and older studies. FINDINGS: Bilateral interstitial and patchy airspace lung opacities are without significant change. No new lung abnormalities. Tracheostomy tube and right PICC are stable. No pneumothorax. IMPRESSION: 1. No change from the previous day's exam. 2. Stable bilateral interstitial and patchy airspace lung opacities. Stable support apparatus. Electronically Signed   By: Lajean Manes M.D.   On: 06/04/2021 15:04  ? ?DG Chest Port 1 View ? ?Result Date: 06/03/2021 ?CLINICAL DATA:  Neuroendocrine carcinoma.  Respiratory failure EXAM: PORTABLE CHEST 1 VIEW COMPARISON:  Three days ago FINDINGS: Tracheostomy tube in place. Right PICC with tip at the SVC.  VP shunt catheter traversing the right chest. Patchy bilateral pulmonary infiltrate. Generous heart size which is stable. No visible effusion or pneumothorax. IMPRESSION: Unchanged hardware positioning and pulmonary infiltrates. Electronically Signed   By: Jorje Guild M.D.   On: 06/03/2021 07:55  ? ?DG Chest Port 1 View ? ?Result Date: 05/31/2021 ?CLINICAL DATA:  Respiratory failure. EXAM: PORTABLE CHEST 1 VIEW COMPARISON:  05/30/2021 FINDINGS: Tracheostomy tube overlies the airway. A right PICC terminates near the superior cavoatrial junction. Shunt tubing courses through the right chest and into the abdomen. The cardiomediastinal silhouette is unchanged. Widespread interstitial and airspace opacities in both lungs have not significantly changed. No large pleural effusion or pneumothorax is identified. IMPRESSION: Unchanged extensive airspace disease. Electronically Signed   By: Logan Bores M.D.   On: 05/31/2021 08:03  ? ?DG CHEST PORT 1 VIEW ? ?Result Date: 05/30/2021 ?CLINICAL DATA:  Status post tracheostomy EXAM: PORTABLE CHEST 1 VIEW COMPARISON:  Radiograph 05/28/2021, chest CT 05/23/2021 FINDINGS: Tracheostomy tube overlies the midthoracic trachea. Right upper extremity PICC tip overlies the distal superior vena cava. Shunt tubing courses down the right chest and right hemiabdomen. Unchanged cardiomediastinal silhouette. Low lung volumes. No significant change in diffuse airspace disease. No acute osseous abnormality. IMPRESSION: Tracheostomy tube overlies the midthoracic trachea. No significant interval change in diffuse airspace disease. Electronically Signed   By: Maurine Simmering M.D.   On: 05/30/2021 17:17  ? ?DG CHEST PORT 1 VIEW ? ?Result Date: 05/28/2021 ?CLINICAL DATA:  46 year old female with sepsis. Status post right hemicolectomy for neuroendocrine tumor last month. EXAM: PORTABLE CHEST 1 VIEW COMPARISON:  05/27/2021 portable chest. CT Chest, Abdomen, and Pelvis today are reported separately. 05/23/2021.  FINDINGS: Portable AP semi upright view at 0600 hours. Stable lines and tubes. Stable lung volumes. Mediastinal contours remain normal. Widespread coarse asymmetric bilateral pulmonary opacity in bot

## 2021-06-15 NOTE — Progress Notes (Signed)
Patient ID: Cassidy Tran, female   DOB: 1975/12/22, 46 y.o.   MRN: [id number] ? ?Met with patient and father in room, introduced myself, and explained my role in her care. Introduced the [employer] and its contents. Explain that the notebook is hers and to take at discharge. Answered questions about rehab and therapy schedule. Set up her suction, arbitrator, and ambu bag. Assisted NT with [facility] for patient usage. Offered my office hours and explained I was available when needed. I will continue to monitor patient's progress. ? ?Dorthula Nettles, RN3, BSN, CBIS, CRRN, WTA ?Care Coordinator, Inpatient Rehabilitation ?Office [phone]   ?

## 2021-06-15 NOTE — Discharge Summary (Deleted)
  The note originally documented on this encounter has been moved the the encounter in which it belongs.  

## 2021-06-16 DIAGNOSIS — J9601 Acute respiratory failure with hypoxia: Secondary | ICD-10-CM | POA: Diagnosis not present

## 2021-06-16 LAB — CBC WITH DIFFERENTIAL/PLATELET
Abs Immature Granulocytes: 0.04 10*3/uL (ref 0.00–0.07)
Basophils Absolute: 0 10*3/uL (ref 0.0–0.1)
Basophils Relative: 0 %
Eosinophils Absolute: 0.5 10*3/uL (ref 0.0–0.5)
Eosinophils Relative: 4 %
HCT: 27.1 % — ABNORMAL LOW (ref 36.0–46.0)
Hemoglobin: 8.3 g/dL — ABNORMAL LOW (ref 12.0–15.0)
Immature Granulocytes: 0 %
Lymphocytes Relative: 26 %
Lymphs Abs: 3.3 10*3/uL (ref 0.7–4.0)
MCH: 27.4 pg (ref 26.0–34.0)
MCHC: 30.6 g/dL (ref 30.0–36.0)
MCV: 89.4 fL (ref 80.0–100.0)
Monocytes Absolute: 0.6 10*3/uL (ref 0.1–1.0)
Monocytes Relative: 5 %
Neutro Abs: 8.5 10*3/uL — ABNORMAL HIGH (ref 1.7–7.7)
Neutrophils Relative %: 65 %
Platelets: 388 10*3/uL (ref 150–400)
RBC: 3.03 MIL/uL — ABNORMAL LOW (ref 3.87–5.11)
RDW: 14.5 % (ref 11.5–15.5)
WBC: 13 10*3/uL — ABNORMAL HIGH (ref 4.0–10.5)
nRBC: 0 % (ref 0.0–0.2)

## 2021-06-16 LAB — COMPREHENSIVE METABOLIC PANEL
ALT: 21 U/L (ref 0–44)
AST: 16 U/L (ref 15–41)
Albumin: 2.3 g/dL — ABNORMAL LOW (ref 3.5–5.0)
Alkaline Phosphatase: 159 U/L — ABNORMAL HIGH (ref 38–126)
Anion gap: 9 (ref 5–15)
BUN: 7 mg/dL (ref 6–20)
CO2: 30 mmol/L (ref 22–32)
Calcium: 8.6 mg/dL — ABNORMAL LOW (ref 8.9–10.3)
Chloride: 96 mmol/L — ABNORMAL LOW (ref 98–111)
Creatinine, Ser: 0.55 mg/dL (ref 0.44–1.00)
GFR, Estimated: >60 mL/min (ref >60)
Glucose, Bld: 119 mg/dL — ABNORMAL HIGH (ref 70–99)
Potassium: 3.4 mmol/L — ABNORMAL LOW (ref 3.5–5.1)
Sodium: 135 mmol/L (ref 135–145)
Total Bilirubin: 0.4 mg/dL (ref 0.3–1.2)
Total Protein: 6.5 g/dL (ref 6.5–8.1)

## 2021-06-16 MED ORDER — CARVEDILOL 3.125 MG PO TABS
3.1250 mg | ORAL_TABLET | Freq: Two times a day (BID) | ORAL | Status: DC
Start: 2021-06-16 — End: 2021-06-25
  Administered 2021-06-16 – 2021-06-24 (×16): 3.125 mg via ORAL
  Filled 2021-06-16 (×17): qty 1

## 2021-06-16 MED ORDER — OSMOLITE 1.5 CAL PO LIQD
260.0000 mL | ORAL | Status: DC
  Administered 2021-06-16: 260 mL
  Filled 2021-06-16 (×2): qty 474

## 2021-06-16 MED ORDER — MELATONIN 3 MG PO TABS
3.0000 mg | ORAL_TABLET | Freq: Every day | ORAL | Status: DC
  Administered 2021-06-16 – 2021-06-24 (×7): 3 mg via ORAL
  Filled 2021-06-16 (×9): qty 1

## 2021-06-16 MED ORDER — KATE FARMS STANDARD 1.4 PO LIQD
325.0000 mL | Freq: Three times a day (TID) | ORAL | Status: DC
  Filled 2021-06-16 (×23): qty 325

## 2021-06-16 MED ORDER — POTASSIUM CHLORIDE 20 MEQ PO PACK
40.0000 meq | PACK | Freq: Every day | ORAL | Status: DC
  Administered 2021-06-17: 40 meq via ORAL
  Filled 2021-06-16: qty 2

## 2021-06-16 MED ORDER — OXYCODONE HCL 5 MG/5ML PO SOLN
5.0000 mg | ORAL | Status: DC | PRN
  Administered 2021-06-16 – 2021-06-20 (×4): 5 mg via ORAL
  Administered 2021-06-21 – 2021-06-22 (×2): 10 mg via ORAL
  Administered 2021-06-22: 5 mg via ORAL
  Administered 2021-06-23: 10 mg via ORAL
  Filled 2021-06-16 (×4): qty 10
  Filled 2021-06-16 (×5): qty 5

## 2021-06-16 MED ORDER — PROSOURCE TF PO LIQD
45.0000 mL | Freq: Three times a day (TID) | ORAL | Status: DC
  Administered 2021-06-17: 45 mL
  Filled 2021-06-16 (×4): qty 45

## 2021-06-16 NOTE — Progress Notes (Signed)
Inpatient Rehabilitation  Patient information reviewed and entered into eRehab system by Dona Klemann Puneet Selden, OTR/L.   Information including medical coding, functional ability and quality indicators will be reviewed and updated through discharge.    

## 2021-06-16 NOTE — Progress Notes (Signed)
Patient c/o abdominal pain with flushing of the PEG tube and nocturnal feedings patient stated it started 2 days ago and has increasingly gotten worst. Pain was 9/10 tube feeding was held at 4 am.  ?

## 2021-06-16 NOTE — Progress Notes (Signed)
Initial Nutrition Assessment ? ?DOCUMENTATION CODES:  ? ?Not applicable ? ?INTERVENTION:  ?Provide D[employer] [place] Standard 1.4 cal formula po TID, each supplement provides 455 kcal and 20 grams of protein.  ? ?Continue Osmolite 1.5 cal formula via PEG at volume of 260 ml infused at rate of 65 ml/hr over 4 hours (1800-2200) per MD orders. ? ?Provide 45 ml Prosource TF BID per tube.  ? ?Tube feeding regimen provides 510 kcal (27% of kcal needs), 49 grams of protein (49% of protein needs), and 198 ml free water.  ? ?NUTRITION DIAGNOSIS:  ? ?Increased nutrient needs related to  (healing) as evidenced by estimated needs. ? ?GOAL:  ? ?Patient will meet greater than or equal to 90% of their needs ? ?MONITOR:  ? ?PO intake, Supplement acceptance, TF tolerance, Labs, Weight trends, I & O's ? ?REASON FOR ASSESSMENT:  ? ?New TF ?  ? ?ASSESSMENT:  ? ?46 year old female admitted on 05/05/2021 for planned right hemicolectomy secondary to neuroendocrine tumor.  She also had diagnosis of biliary dyskinesia and underwent concomitant cholecystectomy.  She has a history of von Willebrand's disease. She ultimately required intubation on 3/16.  Required PEG placement. Pt requires CIR for ongoing dysfunction secondary to debility related to postoperative respiratory failure, right hemicolectomy, encephalopathy. ? ?Pt with trach capped. Pt is currently on a regular diet with thin liquids. Family at bedside reports pt only ate a couple of bites at breakfast this morning and slept through lunch time. Per MD, pt ate 75% of dinner last night. MD has order nocturnal tube feeds to be adjusted to run over 4 hours to stimulate appetite and intake during the day. RD to order nutritional supplements to aid in caloric and protein needs. Family reports pt with dislike of Boost Breeze and milk-type supplements. Family agreeable on pt trying [employer] supplements. Will order. Family to encourage pt po intake. If pt po intake does not improve, RD to modify  nocturnal tube feeds to better meet nutrition needs.  ? ?Unable to complete Nutrition-Focused physical exam at this time.  ?Pt requesting rest at time of visit.  ? ?Labs and medications reviewed.  ? ?Diet Order:   ?Diet Order   ? ?       ?  Diet regular Room service appropriate? Yes with Assist; Fluid consistency: Thin  Diet effective now       ?  ? ?  ?  ? ?  ? ? ?EDUCATION NEEDS:  ? ?Not appropriate for education at this time ? ?Skin:  Skin Integrity Issues:: Incisions ?Incisions: abdomen, neck ? ?Last BM:  4/19 ? ?Height:  ? ?Ht Readings from Last 1 Encounters:  ?06/15/21 '5\' 3"'$  (1.6 m)  ? ? ?Weight:  ? ?Wt Readings from Last 1 Encounters:  ?06/16/21 86 kg  ? ?BMI:  Body mass index is 33.59 kg/m?. ? ?Estimated Nutritional Needs:  ? ?Kcal:  1900-2100 ? ?Protein:  100-115 grams ? ?Fluid:  >/= 1.9 L/day ? ?Corrin Parker, MS, RD, LDN ?RD pager number/after hours weekend pager number on Amion. ? ?

## 2021-06-16 NOTE — Evaluation (Signed)
Physical Therapy Assessment and Plan ? ?Patient Details  ?Name: Cassidy Tran ?MRN: [id number] ?Date of Birth: Dec 22, 1975 ? ?PT Diagnosis: Abnormal posture, Abnormality of gait, Cognitive deficits, Difficulty walking, Edema, and Muscle weakness ?Rehab Potential: Good ?ELOS: 10-12 days  ? ?Today's Date: 06/16/2021 ?PT Individual Time: 1300-1400 ?PT Individual Time Calculation (min): 60 min   ? ?Hospital Problem: Principal Problem: ?  Respiratory failure ([place]) ? ? ?Past Medical History:  ?Past Medical History:  ?Diagnosis Date  ? Acid reflux   ? Allergy   ? Anxiety   ? Arthritis   ? lower back  ? ASCUS (atypical squamous cells of undetermined significance) on Pap smear 03/2011  ? NEG HR HPV  ? Asthma   ? Cancer [facility])   ? skin cancer- age 49ish  ? Cervical dysplasia, mild 08/2010  ? LGSIL colposcopy biopsy showing koilocytotic atypia  ? Clotting disorder ([place])   ? Depression   ? Endometriosis   ? Headache(784.0)   ? High risk HPV infection 12/2011  ? Pap normal  ? Hypertension   ? Pneumonia   ? 2016ish  ? Seizures ([place])   ? seizure disorder  ? Smoker   ? Von Willebrand disease ([place])   ? ?Past Surgical History:  ?Past Surgical History:  ?Procedure Laterality Date  ? APPLICATION OF CRANIAL NAVIGATION N/A 11/24/2016  ? Procedure: APPLICATION OF CRANIAL NAVIGATION;  Surgeon: Kristeen Miss, MD;  Location: [place];  Service: Neurosurgery;  Laterality: N/A;  ? BRAIN SURGERY    ? BREAST BIOPSY Left   ? CHOLECYSTECTOMY N/A 05/05/2021  ? Procedure: CHOLECYSTECTOMY;  Surgeon: [name], MD;  Location: [place];  Service: General;  Laterality: N/A;  ? COLON RESECTION N/A 05/05/2021  ? Procedure: RIGHT HEMICOLECTOMY;  Surgeon: [name], MD;  Location: [place];  Service: General;  Laterality: N/A;  ? COLPOSCOPY    ? CYSTOSCOPY N/A 10/14/2012  ? Procedure: CYSTOSCOPY;  Surgeon: [name], MD;  Location: [facility];  Service: Gynecology;  Laterality: N/A;  ? DILATION AND CURETTAGE OF UTERUS    ? HYSTEROSCOPY WITH D & C   10/14/2010  ? Procedure: DILATATION AND CURETTAGE (D&C) /HYSTEROSCOPY;  Surgeon: [name], MD;  Location: [facility];  Service: Gynecology;  Laterality: N/A;  ? LAPAROSCOPIC HYSTERECTOMY N/A 10/14/2012  ? Procedure: HYSTERECTOMY TOTAL LAPAROSCOPIC;  Surgeon: [name], MD;  Location: [facility];  Service: Gynecology;  Laterality: N/A;  CPT [phone] ? ?2 1/2 hours ? ?Dr. Uvaldo Rising to assist.  ? LAPAROSCOPY  04/27/2011  ? Procedure: LAPAROSCOPY OPERATIVE;  Surgeon: [name], MD;  Location: [facility];  Service: Gynecology;  Laterality: N/A;  removal right cyst , lysis of adhesions, biopsy of peritoneum  ? LAPAROTOMY N/A 05/05/2021  ? Procedure: EXPLORATORY LAPAROTOMY;  Surgeon: [name], MD;  Location: [place];  Service: General;  Laterality: N/A;  ? [place]  ? IN [place], [place]  ? PEG PLACEMENT N/A 05/30/2021  ? Procedure: PERCUTANEOUS ENDOSCOPIC GASTROSTOMY (PEG) PLACEMENT;  Surgeon: [name], MD;  Location: [place];  Service: General;  Laterality: N/A;  ? SUBOCCIPITAL CRANIECTOMY CERVICAL LAMINECTOMY N/A 11/06/2016  ? Procedure: Suboccipital decompression for chiari malformation;  Surgeon: Kristeen Miss, MD;  Location: [place];  Service: Neurosurgery;  Laterality: N/A;  ? TONSILLECTOMY    ? TRACHEOSTOMY TUBE PLACEMENT N/A 05/30/2021  ? Procedure: TRACHEOSTOMY;  Surgeon: [name], MD;  Location[facility];  Service: General;  Laterality: N/A;  ? VENTRICULOPERITONEAL  SHUNT N/A 11/24/2016  ? Procedure: SHUNT INSERTION VENTRICULAR-PERITONEAL with Brainlab;  Surgeon: Kristeen Miss, MD;  Location: [place];  Service: Neurosurgery;  Laterality: N/A;  right side approach  ? [place]  ? ? ?Assessment & Plan ?Clinical Impression: Patient is a 46 y.o. year old female admitted on 05/05/2021 for planned right hemicolectomy secondary to neuroendocrine tumor.  She also had diagnosis of biliary dyskinesia and underwent concomitant cholecystectomy.  She has a history of von  Willebrand's disease and was premedicated with DDAVP.  She tolerated procedure well.  She developed respiratory distress and volume overload on 3/14 and was transferred to the intensive care unit.  She was found to have an elevated white blood cell count and developed encephalopathy.  Neurosurgery was consulted due to the patient's history of VP shunt secondary in 2018 to Chiari malformation.  Lumbar puncture was performed without signs of infection.  She was treated with multiple antibiotics.  She ultimately required intubation on 3/16.  Required PEG placement.  Neurology consulted and her encephalopathy and history of epilepsy.  Patient's home regimen was Depakote ER 2000 mg twice daily and zonisamide 100 mg twice daily.  Per patient's husband's report, patient was not compliant with these medications on a regular basis.  Encephalopathy most likely due to hyperammonemia in the setting of Depakote use.  Recommendations are to transition Keppra to zonisamide once taking p.o. intake.  The patient has currently been advanced to regular diet and receiving tube feeds at nighttime.The patient requires inpatient medicine and rehabilitation evaluations and services for ongoing dysfunction secondary to debility related to postoperative respiratory failure, right hemicolectomy, encephalopathy. She is having abdominal pain when she get IV medications.  Patient transferred to CIR on 06/15/2021 .  ? ?Patient currently requires min with mobility secondary to muscle weakness, decreased cardiorespiratoy endurance and decreased oxygen support, decreased awareness, decreased problem solving, decreased memory, and delayed processing, and decreased sitting balance, decreased standing balance, decreased postural control, and decreased balance strategies.  Prior to hospitalization, patient was independent  with mobility and lived with Spouse in a House home.  Home access is 2Stairs to enter. ? ?Patient will benefit from skilled PT  intervention to maximize safe functional mobility, minimize fall risk, and decrease caregiver burden for planned discharge home with 24 hour supervision.  Anticipate patient will benefit from follow up [place] at discharge. ? ?PT - End of Session ?Activity Tolerance: Tolerates 10 - 20 min activity with multiple rests ?Endurance Deficit: Yes ?Endurance Deficit Description: new oxygen support up to 6L/min with mobility to maintain O2 saturations >90% ?PT Assessment ?Rehab Potential (ACUTE/IP ONLY): Good ?PT Barriers to Discharge: Inaccessible home environment;Behavior;New oxygen;Nutrition means;Other (comments) ?PT Barriers to Discharge Comments: urinary and bowl frequency ?PT Patient demonstrates impairments in the following area(s): Balance;Behavior;Safety;Edema;Sensory;Skin Integrity;Endurance;Motor;Nutrition;Pain ?PT Transfers Functional Problem(s): Bed Mobility;Bed to Chair;Car;Furniture ?PT Locomotion Functional Problem(s): Ambulation;Wheelchair Mobility;Stairs ?PT Plan ?PT Intensity: Minimum of 1-2 x/day ,45 to 90 minutes ?PT Frequency: 5 out of 7 days ?PT Duration Estimated Length of Stay: 10-12 days ?PT Treatment/Interventions: Ambulation/gait training;Cognitive remediation/compensation;Discharge planning;DME/adaptive equipment instruction;Functional mobility training;Pain management;Psychosocial support;Splinting/orthotics;Therapeutic Activities;UE/LE Strength taining/ROM;Wheelchair propulsion/positioning;UE/LE Coordination activities;Therapeutic Exercise;Stair training;Skin care/wound management;Patient/family education;Neuromuscular re-education;Functional electrical stimulation;Disease management/prevention;Community reintegration;Balance/vestibular training ?PT Transfers Anticipated Outcome(s): supervision with LRAD ?PT Locomotion Anticipated Outcome(s): supervision with LRAD >75 ft ?PT Recommendation ?Recommendations for Other Services: Neuropsych consult ?Follow Up Recommendations: Home health PT ?Patient  destination: Home ?Equipment Recommended: Rolling walker with 5" wheels;Wheelchair (measurements);Wheelchair cushion (measurements) ?Equipment Details: w/c type and measurements TBD ? ? ?  PT Evaluation ?Precautions/R

## 2021-06-16 NOTE — Progress Notes (Signed)
?                                                       PROGRESS NOTE ? ? ?Subjective/Complaints: ?Has pain when receiving medications via PEG and discomfort when receiving TF. Was able to eat 3/4 pot roast yesterday and 1/2 banana breakfast this AM ? ?ROS: +pain when receiving medications via PEG ? ? ?Objective: ?  ?No results found. ?Recent Labs  ?  06/15/21 ?0510 06/16/21 ?0322  ?WBC 9.6 13.0*  ?HGB 8.5* 8.3*  ?HCT 26.3* 27.1*  ?PLT 379 388  ? ?Recent Labs  ?  06/15/21 ?0510 06/16/21 ?0322  ?NA 138 135  ?K 3.5 3.4*  ?CL 99 96*  ?CO2 33* 30  ?GLUCOSE 130* 119*  ?BUN 9 7  ?CREATININE 0.52 0.55  ?CALCIUM 8.9 8.6*  ? ?No intake or output data in the 24 hours ending 06/16/21 1123  ? ?  ? ?Physical Exam: ?Vital Signs ?Blood pressure (!) 131/49, pulse (!) 105, temperature 98.1 ?F (36.7 ?C), temperature source Oral, resp. rate 18, height '5\' 3"'$  (1.6 m), weight 86 kg, last menstrual period 09/16/2012, SpO2 98 %. ? ?Gen: no distress, normal appearing ?HEENT: oral mucosa pink and moist, NCAT ?Cardio: Tachycardic ?Chest: normal effort, normal rate of breathing ?Abdominal:  ?   Comments: PEG site OK; 1 cm midline incision inferior skin edge separation is granulating  ?Musculoskeletal:  ?   Cervical back: Normal range of motion.  ?   Comments: Right upper arm PICC in place  ?Skin: ?   General: Skin is warm and dry.  ?Neurological:  ?   Mental Status: She is alert.   ? ?Assessment/Plan: ?1. Functional deficits which require 3+ hours per day of interdisciplinary therapy in a comprehensive inpatient rehab setting. ?Physiatrist is providing close team supervision and 24 hour management of active medical problems listed below. ?Physiatrist and rehab team continue to assess barriers to discharge/monitor patient progress toward functional and medical goals ? ?Care Tool: ? ?Bathing ?   ?Body parts bathed by patient: Abdomen, Chest, Left arm, Right upper leg, Left upper leg, Face  ? Body parts bathed by helper: Front perineal area,  Buttocks ?  ?  ?Bathing assist Assist Level: Minimal Assistance - Patient > 75% ?  ?  ?Upper Body Dressing/Undressing ?Upper body dressing   ?What is the patient wearing?: Pull over shirt ?   ?Upper body assist Assist Level: Moderate Assistance - Patient 50 - 74% ?   ?Lower Body Dressing/Undressing ?Lower body dressing ? ? ?   ?What is the patient wearing?: Pants ? ?  ? ?Lower body assist Assist for lower body dressing: Minimal Assistance - Patient > 75% ?   ? ?Toileting ?Toileting    ?Toileting assist Assist for toileting: Maximal Assistance - Patient 25 - 49% ?  ?  ?Transfers ?Chair/bed transfer ? ?Transfers assist ?   ? ?Chair/bed transfer assist level: Contact Guard/Touching assist ?  ?  ?Locomotion ?Ambulation ? ? ?Ambulation assist ? ?   ? ?  ?  ?   ? ?Walk 10 feet activity ? ? ?Assist ?   ? ?  ?   ? ?Walk 50 feet activity ? ? ?Assist   ? ?  ?   ? ? ?Walk 150 feet activity ? ? ?Assist   ? ?  ?  ?  ? ?  Walk 10 feet on uneven surface  ?activity ? ? ?Assist   ? ? ?  ?   ? ?Wheelchair ? ? ? ? ?Assist   ?  ?  ? ?  ?   ? ? ?Wheelchair 50 feet with 2 turns activity ? ? ? ?Assist ? ?  ?  ? ? ?   ? ?Wheelchair 150 feet activity  ? ? ? ?Assist ?   ? ? ?   ? ?Blood pressure (!) 131/49, pulse (!) 105, temperature 98.1 ?F (36.7 ?C), temperature source Oral, resp. rate 18, height '5\' 3"'$  (1.6 m), weight 86 kg, last menstrual period 09/16/2012, SpO2 98 %. ? ?Medical Problem List and Plan: ?1. Functional deficits secondary to operative complications including respiratory failure status post right hemicolectomy for neuroendocrine tumor. ?            -patient may shower ?            -ELOS/Goals: 7-10 days        ?            Admit to CIR ?2.  Antithrombotics: ?-DVT/anticoagulation:  Pharmaceutical: Lovenox ?            -antiplatelet therapy: None ?3. Abdominal pain with IV medication administration: Tylenol, oxycodone as needed. Placed nursing order to provide oxycodone 30 minutes prior to IV pain medication administration.  ?4.  Mood: LCSW to evaluate and provide emotional support ?            -- On scheduled Valium 4 mg 3 times daily for anxiety ?            -antipsychotic agents: n/a ?5. Neuropsych: This patient is capable of making decisions on her own behalf. ?6. Skin/Wound Care: Routine skin care checks ?7. Fluids/Electrolytes/Nutrition: Routine I's and O's and follow-up chemistries ?            -- Regular diet/thin liquids 4/17 ?            -- Nocturnal tube feeds Osmolite 1.5 mL at 65 mL/h from 6 PM to 8 AM ?            -- SSI>>not requiring significant coverage ?8: Tracheostomy 4/3>>now in 4CL, capped 4/18 ?            Continuous pulse ox ordered.  ?9: Volume overload>>Lasix 80 mg BID ?            --Goal per GS = I/O even to 500 cc negative per 24 hours ?            --inaccurate urine output ?11: ABLA: H and H low but stable; continue to monitor, follow-up CBC ?12: Epilepsy>>no new seizure activity ?            --post-op encephalopathy likely due to Depakote, now on Keppra ?            --neuro rec: switching to zonasimide 100 mg daily ?13: Chiari malformation s/p V-P shunt 2018 ?            --ID work-up this admission; negative L-P ?14: Von Willebrand disease: stable perioperative course ?15: Neuroendocrine tumor s/p right hemicolectomy: follow-up with Dr. Marin Olp ?16: Biliary dyskinesia: s/p cholecystectomy ?            --diarrhea improved with Imodium, probiotics and cholestyramine>>no BM in 2-3 days so will discontinue Imodium and cholestyramine ?17. Tachycardia: increase coreg 3.'125mg'$  to BID  ?18. Decreased appetite: decrease TF to 256ms over 4 hours at  night since eating better but without appetite due to TF.  ?19. Hypokalemia: change potassium to oral.  ?  ?  ? ?LOS: ?1 days ?A FACE TO FACE EVALUATION WAS PERFORMED ? ?Cassidy Tran ?06/16/2021, 11:23 AM  ? ?  ?

## 2021-06-16 NOTE — Evaluation (Signed)
Speech Language Pathology Assessment and Plan ? ?Patient Details  ?Name: Cassidy Tran ?MRN: [id number] ?Date of Birth: [birth year] ? ?SLP Diagnosis: Cognitive Impairments;Dysphagia  ?Rehab Potential: Excellent ?ELOS: 10-12 days  ? ? ?Today's Date: 06/16/2021 ?SLP Individual Time: 1005-1100 ?SLP Individual Time Calculation (min): 55 min ? ? ?Hospital Problem: Principal Problem: ?  Respiratory failure ([place]) ? ?Past Medical History:  ?Past Medical History:  ?Diagnosis Date  ? Acid reflux   ? Allergy   ? Anxiety   ? Arthritis   ? lower back  ? ASCUS (atypical squamous cells of undetermined significance) on Pap smear 03/2011  ? NEG HR HPV  ? Asthma   ? Cancer [facility])   ? skin cancer- age 20ish  ? Cervical dysplasia, mild 08/2010  ? LGSIL colposcopy biopsy showing koilocytotic atypia  ? Clotting disorder ([place])   ? Depression   ? Endometriosis   ? Headache(784.0)   ? High risk HPV infection 12/2011  ? Pap normal  ? Hypertension   ? Pneumonia   ? 2016ish  ? Seizures ([place])   ? seizure disorder  ? Smoker   ? Von Willebrand disease ([place])   ? ?Past Surgical History:  ?Past Surgical History:  ?Procedure Laterality Date  ? APPLICATION OF CRANIAL NAVIGATION N/A 11/24/2016  ? Procedure: APPLICATION OF CRANIAL NAVIGATION;  Surgeon: Kristeen Miss, MD;  Location: [place];  Service: Neurosurgery;  Laterality: N/A;  ? BRAIN SURGERY    ? BREAST BIOPSY Left   ? CHOLECYSTECTOMY N/A 05/05/2021  ? Procedure: CHOLECYSTECTOMY;  Surgeon: [name], MD;  Location: [place];  Service: General;  Laterality: N/A;  ? COLON RESECTION N/A 05/05/2021  ? Procedure: RIGHT HEMICOLECTOMY;  Surgeon: [name], MD;  Location: [place];  Service: General;  Laterality: N/A;  ? COLPOSCOPY    ? CYSTOSCOPY N/A 10/14/2012  ? Procedure: CYSTOSCOPY;  Surgeon: [name], MD;  Location: [facility];  Service: Gynecology;  Laterality: N/A;  ? DILATION AND CURETTAGE OF UTERUS    ? HYSTEROSCOPY WITH D & C  10/14/2010  ? Procedure: DILATATION AND CURETTAGE (D&C)  /HYSTEROSCOPY;  Surgeon: [name], MD;  Location: [facility];  Service: Gynecology;  Laterality: N/A;  ? LAPAROSCOPIC HYSTERECTOMY N/A 10/14/2012  ? Procedure: HYSTERECTOMY TOTAL LAPAROSCOPIC;  Surgeon: [name], MD;  Location: [facility];  Service: Gynecology;  Laterality: N/A;  CPT [phone] ? ?2 1/2 hours ? ?Dr. Uvaldo Rising to assist.  ? LAPAROSCOPY  04/27/2011  ? Procedure: LAPAROSCOPY OPERATIVE;  Surgeon: [name], MD;  Location: [facility];  Service: Gynecology;  Laterality: N/A;  removal right cyst , lysis of adhesions, biopsy of peritoneum  ? LAPAROTOMY N/A 05/05/2021  ? Procedure: EXPLORATORY LAPAROTOMY;  Surgeon: [name], MD;  Location: [place];  Service: General;  Laterality: N/A;  ? [place]  ? IN [place], [place]  ? PEG PLACEMENT N/A 05/30/2021  ? Procedure: PERCUTANEOUS ENDOSCOPIC GASTROSTOMY (PEG) PLACEMENT;  Surgeon: [name], MD;  Location: [place];  Service: General;  Laterality: N/A;  ? SUBOCCIPITAL CRANIECTOMY CERVICAL LAMINECTOMY N/A 11/06/2016  ? Procedure: Suboccipital decompression for chiari malformation;  Surgeon: Kristeen Miss, MD;  Location: [place];  Service: Neurosurgery;  Laterality: N/A;  ? TONSILLECTOMY    ? TRACHEOSTOMY TUBE PLACEMENT N/A 05/30/2021  ? Procedure: TRACHEOSTOMY;  Surgeon: [name], MD;  Location: [place];  Service: General;  Laterality: N/A;  ? VENTRICULOPERITONEAL SHUNT N/A 11/24/2016  ? Procedure: SHUNT INSERTION VENTRICULAR-PERITONEAL with  Brainlab;  Surgeon: Kristeen Miss, MD;  Location: [place];  Service: Neurosurgery;  Laterality: N/A;  right side approach  ? [place]  ? ? ?Assessment / Plan / Recommendation ?Clinical Impression Patient is a 46 y.o. female who presented to [facility] hospital on 05/05/2021 and underwent ex-lap with cholecystectomy and R hemicolectomy for neuroendocrine tumor. Pt developed respiratory failure due to volume overload and required intubation. Pt developed AMS; underwent lumbar punture  3/16 which was negative.  ETT 3/16-trach 4/3. Cortrak 3/15, G-tube 4/3. Currently tolerating regukar textures with thin liquids and trach is now capped. PMH: ASCUS, clotting disorder, seizures, PNA, Von Willebrand disease, VP shunt placement. Therapy evaluations completed with recommendations for CIR. Patient admitted 06/15/21. ? ?Upon arrival, patient has a trach that is currently capped and utilizing supplemental oxygen. Patient is 100% intelligible with a mild hoarse vocal quality. Patient was administered the Cognistat and scored WFL on all subtests. However, throughout informal evaluation, mild deficits were noted in short-term recall of functional information and complex problem solving. Patient also demonstrated delayed processing and often looked to her husband to answer the majority of questions, especially regarding medical history and hospital course.  Patient consumed large, sequential sips of thin liquids without overt s/s of aspiration and demonstrated mildly prolonged mastication with regular textures. Mastication was still efficient and functional, therefore, recommend patient continue current diet of regular textures with thin liquids. Of note, patient with minimal PO intake due to feeling full from supplemental nutrition via PEG, physician aware. Patient would benefit from skilled SLP intervention for tolerance of current diet and to maximize her cognitive functioning and overall functional independence piror to discharge.  ? ?  ?Skilled Therapeutic Interventions          Administered a cognitive-linguistic evaluation and BSE, please see above for details.   ?SLP Assessment ? Patient will need skilled Speech Lanaguage Pathology Services during CIR admission  ?  ?Recommendations ? SLP Diet Recommendations: Age appropriate regular solids;Thin ?Liquid Administration via: Cup;Straw ?Medication Administration: Whole meds with liquid ?Compensations: Minimize environmental distractions;Slow rate;Small  sips/bites ?Postural Changes and/or Swallow Maneuvers: Seated upright 90 degrees ?Oral Care Recommendations: Oral care BID ?Recommendations for Other Services: Neuropsych consult ?Patient destination: Home ?Follow up Recommendations:  (TBD) ?Equipment Recommended: None recommended by SLP  ?  ?SLP Frequency 3 to 5 out of 7 days   ?SLP Duration ? ?SLP Intensity ? ?SLP Treatment/Interventions 10-12 days ? ?Minumum of 1-2 x/day, 30 to 90 minutes ? ?Cognitive remediation/compensation;Internal/external aids;Therapeutic Activities;Environmental Environmental consultant;Dysphagia/aspiration precaution training;Functional tasks;Patient/family education   ? ?Pain ?No/Denies Pain  ? ?SLP Evaluation ?Cognition ?Overall Cognitive Status: Impaired/Different from baseline ?Arousal/Alertness: Awake/alert ?Orientation Level: Oriented X4 ?Memory: Impaired ?Memory Impairment: Decreased short term memory ?Decreased Short Term Memory: Functional basic ?Awareness: Appears intact ?Problem Solving: Impaired ?Problem Solving Impairment: Functional complex ?Safety/Judgment: Appears intact  ?Comprehension ?Auditory Comprehension ?Overall Auditory Comprehension: Appears within functional limits for tasks assessed ?Expression ?Expression ?Primary Mode of Expression: Verbal ?Verbal Expression ?Overall Verbal Expression: Appears within functional limits for tasks assessed ?Written Expression ?Dominant Hand: Right ?Written Expression: Not tested ?Oral Motor ?Oral Motor/Sensory Function ?Overall Oral Motor/Sensory Function: Within functional limits ?Motor Speech ?Overall Motor Speech: Impaired ?Respiration: Impaired ?Level of Impairment: Sentence ?Phonation: Hoarse ?Resonance: Within functional limits ?Articulation: Within functional limitis ?Intelligibility: Intelligible ?Word: 75-100% accurate ?Phrase: 75-100% accurate ?Sentence: 75-100% accurate ?Motor Planning: Witnin functional limits ? ?Care Tool ?Care Tool Cognition ?Ability to hear (with  hearing aid or hearing appliances if normally used Ability  to hear (with hearing aid or hearing appliances if normally used): 0. Adequate - no difficulty in normal conservation, social interaction, listening to T

## 2021-06-16 NOTE — Progress Notes (Signed)
Notified Dr. Marin Olp of patient's transfer to [place], [phone]. He will follow up regarding plans for chemo.  ? ?[name], MD ?General and Trauma Surgery ?[facility] ? ?

## 2021-06-16 NOTE — Evaluation (Signed)
Occupational Therapy Assessment and Plan ? ?Patient Details  ?Name: Cassidy Tran ?MRN: [id number] ?Date of Birth: 04-Feb-1976 ? ?OT Diagnosis: abnormal posture, acute pain, cognitive deficits, and muscle weakness (generalized) ?Rehab Potential: Rehab Potential (ACUTE ONLY): Good ?ELOS: 10-12  ? ?Today's Date: 06/16/2021 ?OT Individual Time: 5102-5852 ?OT Individual Time Calculation (min): 60 min    ? ?Hospital Problem: Principal Problem: ?  Respiratory failure ([place]) ? ? ?Past Medical History:  ?Past Medical History:  ?Diagnosis Date  ? Acid reflux   ? Allergy   ? Anxiety   ? Arthritis   ? lower back  ? ASCUS (atypical squamous cells of undetermined significance) on Pap smear 03/2011  ? NEG HR HPV  ? Asthma   ? Cancer [facility])   ? skin cancer- age 61ish  ? Cervical dysplasia, mild 08/2010  ? LGSIL colposcopy biopsy showing koilocytotic atypia  ? Clotting disorder ([place])   ? Depression   ? Endometriosis   ? Headache(784.0)   ? High risk HPV infection 12/2011  ? Pap normal  ? Hypertension   ? Pneumonia   ? 2016ish  ? Seizures ([place])   ? seizure disorder  ? Smoker   ? Von Willebrand disease ([place])   ? ?Past Surgical History:  ?Past Surgical History:  ?Procedure Laterality Date  ? APPLICATION OF CRANIAL NAVIGATION N/A 11/24/2016  ? Procedure: APPLICATION OF CRANIAL NAVIGATION;  Surgeon: Kristeen Miss, MD;  Location: [place];  Service: Neurosurgery;  Laterality: N/A;  ? BRAIN SURGERY    ? BREAST BIOPSY Left   ? CHOLECYSTECTOMY N/A 05/05/2021  ? Procedure: CHOLECYSTECTOMY;  Surgeon: [name], MD;  Location: [place];  Service: General;  Laterality: N/A;  ? COLON RESECTION N/A 05/05/2021  ? Procedure: RIGHT HEMICOLECTOMY;  Surgeon: [name], MD;  Location: [place];  Service: General;  Laterality: N/A;  ? COLPOSCOPY    ? CYSTOSCOPY N/A 10/14/2012  ? Procedure: CYSTOSCOPY;  Surgeon: [name], MD;  Location: [facility];  Service: Gynecology;  Laterality: N/A;  ? DILATION AND CURETTAGE OF UTERUS    ? HYSTEROSCOPY WITH D &  C  10/14/2010  ? Procedure: DILATATION AND CURETTAGE (D&C) /HYSTEROSCOPY;  Surgeon: [name], MD;  Location: [facility];  Service: Gynecology;  Laterality: N/A;  ? LAPAROSCOPIC HYSTERECTOMY N/A 10/14/2012  ? Procedure: HYSTERECTOMY TOTAL LAPAROSCOPIC;  Surgeon: [name], MD;  Location: [facility];  Service: Gynecology;  Laterality: N/A;  CPT [phone] ? ?2 1/2 hours ? ?Dr. Uvaldo Rising to assist.  ? LAPAROSCOPY  04/27/2011  ? Procedure: LAPAROSCOPY OPERATIVE;  Surgeon: [name], MD;  Location: [facility];  Service: Gynecology;  Laterality: N/A;  removal right cyst , lysis of adhesions, biopsy of peritoneum  ? LAPAROTOMY N/A 05/05/2021  ? Procedure: EXPLORATORY LAPAROTOMY;  Surgeon: [name], MD;  Location: [place];  Service: General;  Laterality: N/A;  ? [place]  ? IN [place], [place]  ? PEG PLACEMENT N/A 05/30/2021  ? Procedure: PERCUTANEOUS ENDOSCOPIC GASTROSTOMY (PEG) PLACEMENT;  Surgeon: [name], MD;  Location: [place];  Service: General;  Laterality: N/A;  ? SUBOCCIPITAL CRANIECTOMY CERVICAL LAMINECTOMY N/A 11/06/2016  ? Procedure: Suboccipital decompression for chiari malformation;  Surgeon: Kristeen Miss, MD;  Location: [place];  Service: Neurosurgery;  Laterality: N/A;  ? TONSILLECTOMY    ? TRACHEOSTOMY TUBE PLACEMENT N/A 05/30/2021  ? Procedure: TRACHEOSTOMY;  Surgeon: [name], MD;  Location: [place];  Service: General;  Laterality: N/A;  ?  VENTRICULOPERITONEAL SHUNT N/A 11/24/2016  ? Procedure: SHUNT INSERTION VENTRICULAR-PERITONEAL with Brainlab;  Surgeon: Kristeen Miss, MD;  Location: [place];  Service: Neurosurgery;  Laterality: N/A;  right side approach  ? [place]  ? ? ?Assessment & Plan ?Clinical Impression:   46 y.o. female presents to [facility] hospital on 05/05/2021 and underwent ex-lap with cholecystectomy and R hemicolectomy for neuroendocrine tumor. Pt developed respiratory failure due to volume overload and required intubation on 3/16. Pt  developed AMS during admission with rising white cell counts, underwent lumbar punture on 3/16. Pt underwent bronchoscopy on 3/21. Bronchoscopy 4/1, tracheostomy 4/3. PMH includes ASCUS, clotting disorder, seizures, PNA, Von Willebrand disease (clotting disorder), VP shunt placement  ? ?Patient currently requires min with basic self-care skills secondary to muscle weakness, decreased cardiorespiratoy endurance, decreased problem solving, decreased safety awareness, and decreased memory, and decreased standing balance, decreased postural control, decreased balance strategies, and decreased coordination .  Prior to hospitalization, patient could complete ADLs/IADLs/vocation with independent . ? ?Patient will benefit from skilled intervention to decrease level of assist with basic self-care skills and increase independence with basic self-care skills prior to discharge home with care partner.  Anticipate patient will require 24 hour supervision and follow up outpatient. ? ?OT - End of Session ?Activity Tolerance: Tolerates 30+ min activity with multiple rests ?Endurance Deficit: Yes ?OT Assessment ?Rehab Potential (ACUTE ONLY): Good ?OT Barriers to Discharge: Trach;New oxygen ?OT Patient demonstrates impairments in the following area(s): Balance;Cognition;Endurance;Motor;Nutrition;Safety ?OT Basic ADL's Functional Problem(s): Grooming;Bathing;Dressing;Toileting ?OT Transfers Functional Problem(s): Toilet;Tub/Shower ?OT Plan ?OT Intensity: Minimum of 1-2 x/day, 45 to 90 minutes ?OT Frequency: 5 out of 7 days ?OT Duration/Estimated Length of Stay: 10-12 ?OT Treatment/Interventions: Balance/vestibular training;Discharge planning;Pain management;Self Care/advanced ADL retraining;Therapeutic Activities;UE/LE Coordination activities;Visual/perceptual remediation/compensation;Therapeutic Exercise;Skin care/wound managment;Patient/family education;Functional mobility training;Disease mangement/prevention;Cognitive  remediation/compensation;Community reintegration;DME/adaptive equipment instruction;Neuromuscular re-education;Psychosocial support;Splinting/orthotics;UE/LE Strength taining/ROM;Wheelchair propulsion/positioning ?OT Self Feeding Anticipated Outcome(s): MOD I ?OT Basic Self-Care Anticipated Outcome(s): set up ?OT Toileting Anticipated Outcome(s): set up ?OT Bathroom Transfers Anticipated Outcome(s): set up ?OT Recommendation ?Patient destination: Home ?Follow Up Recommendations: Home health OT ?Equipment Recommended: 3 in 1 bedside comode;Tub/shower seat;To be determined ? ? ?OT Evaluation ?Precautions/Restrictions  ?Precautions ?Precautions: Fall ?Precaution Comments: trach collar- capped, watch HR & O2 ?Restrictions ?Weight Bearing Restrictions: No ?General ?Chart Reviewed: Yes ?Family/Caregiver Present: Yes ?Vital Signs ?Therapy Vitals ?Pulse Rate: (!) 105 ?Resp: 18 ?Patient Position (if appropriate): Lying ?Oxygen Therapy ?SpO2: 98 % ?O2 Device: Nasal Cannula ?O2 Flow Rate (L/min): 2 L/min ?Pain ?  ?Home Living/Prior Functioning ?Home Living ?Family/patient expects to be discharged to:: Private residence ?Living Arrangements: Spouse/significant other ?Type of Home: House ?Home Access: Stairs to enter ?Home Layout: One level ?Bathroom Shower/Tub: Tub/shower unit ?Bathroom Toilet: Standard ?Bathroom Accessibility: Yes ?Additional Comments: pt lives with husband, parents nearby. Pt works in [employer] and loves her job. Has a dog named Yogi. ? Lives With: Spouse ?Vision ?Baseline Vision/History: 0 No visual deficits ?Ability to See in Adequate Light: 0 Adequate ?Patient Visual Report: No change from baseline ?Vision Assessment?: No apparent visual deficits ?Perception  ?Perception: Within Functional Limits ?Praxis ?Praxis: Intact ?Cognition ?Cognition ?Overall Cognitive Status: Impaired/Different from baseline ?Arousal/Alertness: Awake/alert ?Orientation Level: Person;Place;Situation ?Person: Oriented ?Place:  Oriented ?Situation: Oriented ?Safety/Judgment: Appears intact ?Brief Interview for Mental Status (BIMS) ?Repetition of Three Words (First Attempt): 3 ?Temporal Orientation: Year: Correct ?Temporal Orientation: Month: A

## 2021-06-16 NOTE — Progress Notes (Signed)
Pts trach decannulated per MD order. Stoma was cleaned and dressed with gauze. Pt tolerating well at this time.  ?

## 2021-06-17 ENCOUNTER — Inpatient Hospital Stay (HOSPITAL_COMMUNITY): Payer: No Typology Code available for payment source

## 2021-06-17 DIAGNOSIS — J9601 Acute respiratory failure with hypoxia: Secondary | ICD-10-CM | POA: Diagnosis not present

## 2021-06-17 MED ORDER — LOPERAMIDE HCL 2 MG PO CAPS
2.0000 mg | ORAL_CAPSULE | ORAL | Status: DC | PRN
  Administered 2021-06-17 – 2021-06-20 (×7): 2 mg via ORAL
  Filled 2021-06-17 (×9): qty 1

## 2021-06-17 MED ORDER — MONTELUKAST SODIUM 10 MG PO TABS
10.0000 mg | ORAL_TABLET | Freq: Every day | ORAL | Status: DC
  Administered 2021-06-17 – 2021-06-24 (×8): 10 mg via ORAL
  Filled 2021-06-17 (×8): qty 1

## 2021-06-17 MED ORDER — DIATRIZOATE MEGLUMINE & SODIUM 66-10 % PO SOLN
ORAL | Status: AC
  Administered 2021-06-17: 30 mL via GASTROSTOMY
  Filled 2021-06-17: qty 30

## 2021-06-17 MED ORDER — MECLIZINE HCL 25 MG PO TABS: 25.0000 mg | ORAL_TABLET | Freq: Three times a day (TID) | ORAL | Status: DC | PRN

## 2021-06-17 MED ORDER — FUROSEMIDE 40 MG PO TABS
40.0000 mg | ORAL_TABLET | Freq: Two times a day (BID) | ORAL | Status: DC
  Administered 2021-06-17 – 2021-06-19 (×4): 40 mg via ORAL
  Filled 2021-06-17 (×6): qty 1

## 2021-06-17 MED ORDER — SACCHAROMYCES BOULARDII 250 MG PO CAPS
250.0000 mg | ORAL_CAPSULE | Freq: Two times a day (BID) | ORAL | Status: DC
  Administered 2021-06-17 – 2021-06-25 (×15): 250 mg via ORAL
  Filled 2021-06-17 (×16): qty 1

## 2021-06-17 NOTE — Progress Notes (Signed)
Physical Therapy Session Note ? ?Patient Details  ?Name: Cassidy Tran ?MRN: [id number] ?Date of Birth: 12/28/1975 ? ?Today's Date: 06/17/2021 ?PT Individual Time: 5462-7035 ?PT Individual Time Calculation (min): 25 min  ? ?Short Term Goals: ?Week 1:  PT Short Term Goal 1 (Week 1): Patient will perform basic transfers with CGA consistently using LRAD. ?PT Short Term Goal 2 (Week 1): Patient will ambulate >50 feet using LRAD maintaining O2 saturations >90% with <6L/min O2. ?PT Short Term Goal 3 (Week 1): Patient will initiate stair training. ? ?Skilled Therapeutic Interventions/Progress Updates:  ? Notified by SLP that pt feeling nauseous this afternoon. Received pt semi-reclined in bed with father present at bedside. Pt agreeable to PT treatment and denied any pain during session but reported 3/10 nausea. Session with emphasis on functional mobility/transfers, generalized strengthening and endurance, dynamic standing balance/coordination, and gait training. Pt on 3L O2 via [place] with O2 sats 98% at rest. Pt transferred semi-reclined<>sitting EOB with HOB slightly elevated with supervision and performed all transfers with RW and CGA throughout session. Pt ambulated 40f x 1, 317fx 1, and 50 ft x 1 using RW and CGA with total A to manage O2 tank and father providing WC follow. Initially started on 3L O2 with gait but increased to 4L with O2 sats dropping as low as 83% but pt able to quickly recover >95% within 1 minute of seated rest. Pt reported exhaustion after ambulation and transferred sit<>supine with supervision. Concluded session with pt semi-reclined in bed updating memory notebook, needs within reach, and bed alarm on. Pt left on 3L O2 via [place] with O2 sats 97%. ? ?Therapy Documentation ?Precautions:  ?Precautions ?Precautions: (P) Fall ?Precaution Comments: (P) trach collar- capped, watch HR & O2 ?Restrictions ?Weight Bearing Restrictions: No ? ?Therapy/Group: Individual Therapy ?AnBlenda NicelyAnBecky SaxT,  DPT  ?06/17/2021, 7:26 AM  ?

## 2021-06-17 NOTE — Progress Notes (Signed)
[place] care done. ?

## 2021-06-17 NOTE — Progress Notes (Signed)
?                                                       PROGRESS NOTE ? ? ?Subjective/Complaints: ?No new complaints this morning ?Did well with decrease in TF last night and would like to get off TF- will d/c tonight ? ?ROS: +pain when receiving medications via PEG, difficulty tolerating oral potassium supplement ? ? ?Objective: ?  ?No results found. ?Recent Labs  ?  06/15/21 ?0510 06/16/21 ?0322  ?WBC 9.6 13.0*  ?HGB 8.5* 8.3*  ?HCT 26.3* 27.1*  ?PLT 379 388  ? ?Recent Labs  ?  06/15/21 ?0510 06/16/21 ?0322  ?NA 138 135  ?K 3.5 3.4*  ?CL 99 96*  ?CO2 33* 30  ?GLUCOSE 130* 119*  ?BUN 9 7  ?CREATININE 0.52 0.55  ?CALCIUM 8.9 8.6*  ? ? ?Intake/Output Summary (Last 24 hours) at 06/17/2021 1202 ?Last data filed at 06/17/2021 1130 ?Gross per 24 hour  ?Intake 1325 ml  ?Output 0 ml  ?Net 1325 ml  ?  ? ?  ? ?Physical Exam: ?Vital Signs ?Blood pressure 134/67, pulse 100, temperature 98.7 ?F (37.1 ?C), resp. rate 17, height '5\' 3"'$  (1.6 m), weight 86 kg, last menstrual period 09/16/2012, SpO2 95 %. ? ?Gen: no distress, normal appearing, fatigued ?HEENT: oral mucosa pink and moist, NCAT ?Cardio: Tachycardic ?Chest: normal effort, normal rate of breathing ?Abdominal:  ?   Comments: PEG site OK; 1 cm midline incision inferior skin edge separation is granulating  ?Musculoskeletal:  ?   Cervical back: Normal range of motion.  ?   Comments: Right upper arm PICC in place  ?Skin: ?   General: Skin is warm and dry.  ?Neurological:  ?   Mental Status: She is alert and oriented x4.  3/5 strength throughout ? ?Assessment/Plan: ?1. Functional deficits which require 3+ hours per day of interdisciplinary therapy in a comprehensive inpatient rehab setting. ?Physiatrist is providing close team supervision and 24 hour management of active medical problems listed below. ?Physiatrist and rehab team continue to assess barriers to discharge/monitor patient progress toward functional and medical goals ? ?Care Tool: ? ?Bathing ? Bathing activity did  not occur: Refused ?Body parts bathed by patient: Abdomen, Chest, Left arm, Right upper leg, Left upper leg, Face  ? Body parts bathed by helper: Front perineal area, Buttocks ?  ?  ?Bathing assist Assist Level: Minimal Assistance - Patient > 75% ?  ?  ?Upper Body Dressing/Undressing ?Upper body dressing   ?What is the patient wearing?: Pull over shirt ?   ?Upper body assist Assist Level: Total Assistance - Patient < 25% ?   ?Lower Body Dressing/Undressing ?Lower body dressing ? ? ? Lower body dressing activity did not occur: Refused ?What is the patient wearing?: Pants ? ?  ? ?Lower body assist Assist for lower body dressing: Minimal Assistance - Patient > 75% ?   ? ?Toileting ?Toileting    ?Toileting assist Assist for toileting: Total Assistance - Patient < 25% ?  ?  ?Transfers ?Chair/bed transfer ? ?Transfers assist ?   ? ?Chair/bed transfer assist level: Minimal Assistance - Patient > 75% ?  ?  ?Locomotion ?Ambulation ? ? ?Ambulation assist ? ?   ? ?Assist level: Minimal Assistance - Patient > 75% ?Assistive device: Walker-rolling ?Max distance: 10 ft  ? ?Walk  10 feet activity ? ? ?Assist ?   ? ?Assist level: Minimal Assistance - Patient > 75% ?Assistive device: Walker-rolling  ? ?Walk 50 feet activity ? ? ?Assist Walk 50 feet with 2 turns activity did not occur: Safety/medical concerns ? ?  ?   ? ? ?Walk 150 feet activity ? ? ?Assist Walk 150 feet activity did not occur: Safety/medical concerns ? ?  ?  ?  ? ?Walk 10 feet on uneven surface  ?activity ? ? ?Assist Walk 10 feet on uneven surfaces activity did not occur: Safety/medical concerns ? ? ?  ?   ? ?Wheelchair ? ? ? ? ?Assist Is the patient using a wheelchair?: Yes ?Type of Wheelchair: Manual ?  ? ?Wheelchair assist level: Dependent - Patient 0% ?   ? ? ?Wheelchair 50 feet with 2 turns activity ? ? ? ?Assist ? ?  ?  ? ? ?Assist Level: Dependent - Patient 0%  ? ?Wheelchair 150 feet activity  ? ? ? ?Assist ?   ? ? ?Assist Level: Dependent - Patient 0%   ? ?Blood pressure 134/67, pulse 100, temperature 98.7 ?F (37.1 ?C), resp. rate 17, height '5\' 3"'$  (1.6 m), weight 86 kg, last menstrual period 09/16/2012, SpO2 95 %. ? ?Medical Problem List and Plan: ?1. Functional deficits secondary to operative complications including respiratory failure status post right hemicolectomy for neuroendocrine tumor. ?            -patient may shower ?            -ELOS/Goals: 7-10 days        ?            Continue CIR ?2.  Antithrombotics: ?-DVT/anticoagulation:  Pharmaceutical: Lovenox ?            -antiplatelet therapy: None ?3. Abdominal pain with IV medication administration: Tylenol, oxycodone as needed. Placed nursing order to provide oxycodone 30 minutes prior to IV pain medication administration.  ?4. Mood: LCSW to evaluate and provide emotional support ?            -- On scheduled Valium 4 mg 3 times daily for anxiety ?            -antipsychotic agents: n/a ?5. Neuropsych: This patient is capable of making decisions on her own behalf. ?6. Skin/Wound Care: Routine skin care checks ?7. Fluids/Electrolytes/Nutrition: Routine I's and O's and follow-up chemistries ?            -- Regular diet/thin liquids 4/17 ?            -- Nocturnal tube feeds Osmolite 1.5 mL at 65 mL/h from 6 PM to 8 AM ?            -- SSI>>not requiring significant coverage ?8: Tracheostomy 4/3>>now in 4CL, capped 4/18 ?            Continuous pulse ox ordered.  ?9: Volume overload: decrease Lasix to '40mg'$  BID. I/O ordered, daily weights ordered, tube feeds d/c.  ?11: ABLA: H and H low but stable; continue to monitor, follow-up CBC ?12: Epilepsy>>no new seizure activity ?            --post-op encephalopathy likely due to Depakote, now on Keppra ?            --neuro rec: switching to zonasimide 100 mg daily ?13: Chiari malformation s/p V-P shunt 2018 ?            --ID work-up this admission; negative L-P ?14:  Von Willebrand disease: stable perioperative course ?15: Neuroendocrine tumor s/p right hemicolectomy:  follow-up with Dr. Marin Olp ?16: Biliary dyskinesia: s/p cholecystectomy ?            --diarrhea improved with Imodium, probiotics and cholestyramine>>no BM in 2-3 days so will discontinue Imodium and cholestyramine ?17. Tachycardia: increase coreg 3.'125mg'$  to BID  ?18. Decreased appetite: d/c TF- wean off TF helping to stimulate better appetite.  ?19. Hypokalemia: changed potassium to oral but she could not tolerate this- discontinued potassium as we are decreasing Lasix dose by half, repeat on Monday.  ?  ?  ? ?LOS: ?2 days ?A FACE TO FACE EVALUATION WAS PERFORMED ? ?Cassidy Tran ?06/17/2021, 12:02 PM  ? ?  ?

## 2021-06-17 NOTE — Progress Notes (Signed)
Physical Therapy Session Note ? ?Patient Details  ?Name: Cassidy Tran ?MRN: [id number] ?Date of Birth: 04/12/1975 ? ?Today's Date: 06/17/2021 ? ? ?Short Term Goals: ?Week 1:  PT Short Term Goal 1 (Week 1): Patient will perform basic transfers with CGA consistently using LRAD. ?PT Short Term Goal 2 (Week 1): Patient will ambulate >50 feet using LRAD maintaining O2 saturations >90% with <6L/min O2. ?PT Short Term Goal 3 (Week 1): Patient will initiate stair training. ? ?Skilled Therapeutic Interventions/Progress Updates:  ? ?Pt received supine in bed IV team present reporting that PICC line had just been removed requiring pt to rest in supine for at least 30 min with recommendation for no physical activity within an hour. Pt left supine in bed with all needs met.   ?   ? ?Therapy Documentation ?Precautions:  ?Precautions ?Precautions: (P) Fall ?Precaution Comments: (P) trach collar- capped, watch HR & O2 ?Restrictions ?Weight Bearing Restrictions: No ?General: ?PT Amount of Missed Time (min): 45 Minutes ?PT Missed Treatment Reason: MD hold (Comment) (PICC line removal) ?Vital Signs: ?Therapy Vitals ?Temp: 98.2 ?F (36.8 ?C) ?Temp Source: Oral ?Pulse Rate: 99 ?Resp: 16 ?BP: 127/67 ?Patient Position (if appropriate): Lying ?Oxygen Therapy ?SpO2: 98 % ?O2 Device: Nasal Cannula ? ? ?Therapy/Group: Individual Therapy ? ?Lorie Phenix ?06/17/2021, 5:11 PM  ?

## 2021-06-17 NOTE — Progress Notes (Signed)
Speech Language Pathology Daily Session Note ? ?Patient Details  ?Name: Cassidy Tran ?MRN: [id number] ?Date of Birth: 16-Oct-1975 ? ?Today's Date: 06/17/2021 ?SLP Individual Time: 7408-1448 ?SLP Individual Time Calculation (min): 45 min ?Missed Time: 15 min, nausea  ? ?Short Term Goals: ?Week 1: SLP Short Term Goal 1 (Week 1): Patient will consume current diet without overt s/s of aspiration with Mod I for use of swallowing compensatory strategies. ?SLP Short Term Goal 2 (Week 1): Patient will demonstrate functional problem solving for mildly complex tasks with supervision level verbal cues. ?SLP Short Term Goal 3 (Week 1): Patient will recall new, daily information with Mod A for use of compensatory strategies. ? ?Skilled Therapeutic Interventions: Skilled treatment session focused on cognitive goals. Upon arrival, patient was awake in bed and agreeable to treatment session. SLP requested patient to reposition herself in the bed. Patient requested 2+ assistance but with encouragement, patient was able to independently reposition herself in the bed. SLP facilitated session by providing education regarding the use of a memory notebook to maximize recall and carryover of functional information. Patient verbalized understanding. With extra time and Min verbal cues, patient able to recall events from yesterday's session and the previous therapy session from this morning. Patient also generated 3 goals to work towards throughout the next several days with supervision level verbal cues. While reading the information in the notebook, patient reported nausea. Despite extra time, patient's nausea persisted. Nursing administered medications and SLP ended session early. Patient left supine in bed with alarm on and all needs within reach. Continue with current plan of care.  ?   ? ?Pain ?No/Denies Pain but did report 6/10 nausea. Nursing aware and administered medications  ? ?Therapy/Group: Individual Therapy ? ?Cassidy Tran,  Cassidy Tran ?06/17/2021, 3:04 PM ?

## 2021-06-17 NOTE — Care Management (Signed)
Inpatient Rehabilitation Center ?Individual Statement of Services ? ?Patient Name:  Cassidy Tran  ?Date:  06/17/2021 ? ?Welcome to the [facility].  Our goal is to provide you with an individualized program based on your diagnosis and situation, designed to meet your specific needs.  With this comprehensive rehabilitation program, you will be expected to participate in at least 3 hours of rehabilitation therapies Monday-Friday, with modified therapy programming on the weekends. ? ?Your rehabilitation program will include the following services:  Physical Therapy (PT), Occupational Therapy (OT), Speech Therapy (ST), 24 hour per day rehabilitation nursing, Therapeutic Recreaction (TR), Psychology, Neuropsychology, Care Coordinator, Rehabilitation Medicine, Nutrition Services, Pharmacy Services, and Other ? ?Weekly team conferences will be held on Tuesdays to discuss your progress.  Your Inpatient Rehabilitation Care Coordinator will talk with you frequently to get your input and to update you on team discussions.  Team conferences with you and your family in attendance may also be held. ? ?Expected length of stay: 10-12 days ? ?Overall anticipated outcome: Supervision  ? ?Depending on your progress and recovery, your program may change. Your Inpatient Rehabilitation Care Coordinator will coordinate services and will keep you informed of any changes. Your Inpatient Rehabilitation Care Coordinator's name and contact numbers are listed  below. ? ?The following services may also be recommended but are not provided by the [facility]:  ?Driving Evaluations ?Home Health Rehabiltiation Services ?Outpatient Rehabilitation Services ?Vocational Rehabilitation ?  ?Arrangements will be made to provide these services after discharge if needed.  Arrangements include referral to agencies that provide these services. ? ?Your insurance has been verified to be:  [employer] ? ?Your primary doctor is:   Janie Morning ? ?Pertinent information will be shared with your doctor and your insurance company. ? ?Inpatient Rehabilitation Care Coordinator:  Cathleen Corti [phone] or (C) [phone] ? ?Information discussed with and copy given to patient by: Rana Snare, 06/17/2021, 2:11 PM    ?

## 2021-06-17 NOTE — Progress Notes (Signed)
Occupational Therapy Session Note ? ?Patient Details  ?Name: Cassidy Tran ?MRN: [id number] ?Date of Birth: 06/18/1975 ? ?Today's Date: 06/17/2021 ?OT Individual Time: 7494-4967 ?OT Individual Time Calculation (min): 77 min  ? ? ?Short Term Goals: ?Week 1:  OT Short Term Goal 1 (Week 1): Pt will transfer to toilet with supervision and O2 remain >90% on supp O2 to dmeo improved hemodynamic stability ?OT Short Term Goal 2 (Week 1): Pt will complete 2/3 steps of toileting ?OT Short Term Goal 3 (Week 1): Pt will bathe at shower level with no more than min A ?OT Short Term Goal 4 (Week 1): Pt will need no more than min cuing for breathing strategies during funcitonal transfers to improve recall during transitional movements ? ?Skilled Therapeutic Interventions/Progress Updates:  ?  Treatment session with focus on functional mobility, stand pivot transfers, and increased participation during self-care tasks.  Pt received semi-reclined in bed with RN present administering morning meds.  Pt reporting pain in abdomen with meds through PEG tube.  Pt initially expressing desire to shower, however after therapist gathered all items, pt reports increased fearfulness/anxiety and nausea.  Pt ultimately agreeable to completing self-care tasks at sink.  Pt completed bed mobility with supervision and max encouragement, husband present and encouraging throughout session.  Pt completed stand pivot transfer to [facility] with min assist.  Pt did assist with pulling pants down prior to sitting, however husband completed hygiene and clothing management post toileting.  Therapist washed pt's hair while pt seated at sink with total assist due to setup/postioning of task.  Pt reporting dizziness and nausea during transitional movements from Supine > sit  as well as even when transitioning from reclined in w/c to upright.  Pt O2 sats dropped to 88% on 3L during transfer and post transfer, however able to return to mid 90s with cues for breathing  technique.  Husband assist with donning/doffing shirt despite cues from therapist for pt to attempt task. Pt returned to bed at end of session due to fatigue and reports of nausea.  Pt remained on 2L O2 via nasal canula with sats in mid 90s. ? ?Therapy Documentation ?Precautions:  ?Precautions ?Precautions: (P) Fall ?Precaution Comments: (P) trach collar- capped, watch HR & O2 ?Restrictions ?Weight Bearing Restrictions: No ?General: ?  ?Vital Signs: ?Oxygen Therapy ?SpO2: 95 % ?O2 Device: Nasal Cannula ?O2 Flow Rate (L/min): 2 L/min ?Pain: ? Pt with no c/o pain ? ? ?Therapy/Group: Individual Therapy ? ?Simonne Come ?06/17/2021, 11:39 AM ?

## 2021-06-17 NOTE — IPOC Note (Signed)
Overall Plan of Care (IPOC) ?Patient Details ?Name: Cassidy Tran ?MRN: [id number] ?DOB: August 03, 1975 ? ?Admitting Diagnosis: Respiratory failure ([place]) ? ?Hospital Problems: Principal Problem: ?  Respiratory failure ([place]) ? ? ? ? Functional Problem List: ?Nursing Bladder, Edema, Endurance, Medication Management, Motor, Nutrition, Pain, Safety, Skin Integrity  ?PT Balance, Behavior, Safety, Edema, Sensory, Skin Integrity, Endurance, Motor, Nutrition, Pain  ?OT Balance, Cognition, Endurance, Motor, Nutrition, Safety  ?SLP Cognition, Nutrition  ?TR    ?    ? Basic ADL?s: ?OT Grooming, Bathing, Dressing, Toileting  ? ?  Advanced  ADL?s: ?OT    ?   ?Transfers: ?PT Bed Mobility, Bed to Chair, Car, Furniture  ?OT Toilet, Tub/Shower  ? ?  Locomotion: ?PT Ambulation, Wheelchair Mobility, Stairs  ? ?  Additional Impairments: ?OT    ?SLP Swallowing, Communication, Social Cognition ?  ?Problem Solving, Memory  ?TR    ? ? ?Anticipated Outcomes ?Item Anticipated Outcome  ?Self Feeding MOD I  ?Swallowing ? Mod I ?  ?Basic self-care ? set up  ?Toileting ? set up ?  ?Bathroom Transfers set up  ?Bowel/Bladder ? supervision  ?Transfers ? supervision with LRAD  ?Locomotion ? supervision with LRAD >75 ft  ?Communication ?    ?Cognition ? Mod I  ?Pain ? < 3  ?Safety/Judgment ? supervision  ? ?Therapy Plan: ?PT Intensity: Minimum of 1-2 x/day ,45 to 90 minutes ?PT Frequency: 5 out of 7 days ?PT Duration Estimated Length of Stay: 10-12 days ?OT Intensity: Minimum of 1-2 x/day, 45 to 90 minutes ?OT Frequency: 5 out of 7 days ?OT Duration/Estimated Length of Stay: 10-12 ?SLP Intensity: Minumum of 1-2 x/day, 30 to 90 minutes ?SLP Frequency: 3 to 5 out of 7 days ?SLP Duration/Estimated Length of Stay: 10-12 days  ? ?Due to the current state of emergency, patients may not be receiving their 3-hours of Medicare-mandated therapy. ? ? Team Interventions: ?Nursing Interventions Patient/Family Education, Bladder Management, Disease  Management/Prevention, Pain Management, Medication Management, Skin Care/Wound Management, Discharge Planning  ?PT interventions Ambulation/gait training, Cognitive remediation/compensation, Discharge planning, DME/adaptive equipment instruction, Functional mobility training, Pain management, Psychosocial support, Splinting/orthotics, Therapeutic Activities, UE/LE Strength taining/ROM, Wheelchair propulsion/positioning, UE/LE Coordination activities, Therapeutic Exercise, Stair training, Skin care/wound management, Patient/family education, Neuromuscular re-education, Functional electrical stimulation, Disease management/prevention, Academic librarian, Training and development officer  ?OT Interventions Balance/vestibular training, Discharge planning, Pain management, Self Care/advanced ADL retraining, Therapeutic Activities, UE/LE Coordination activities, Visual/perceptual remediation/compensation, Therapeutic Exercise, Skin care/wound managment, Patient/family education, Functional mobility training, Disease mangement/prevention, Cognitive remediation/compensation, Community reintegration, DME/adaptive equipment instruction, Neuromuscular re-education, Psychosocial support, Splinting/orthotics, UE/LE Strength taining/ROM, Wheelchair propulsion/positioning  ?SLP Interventions Cognitive remediation/compensation, Internal/external aids, Therapeutic Activities, Environmental controls, Cueing hierarchy, Dysphagia/aspiration precaution training, Functional tasks, Patient/family education  ?TR Interventions    ?SW/CM Interventions Discharge Planning, Psychosocial Support, Patient/Family Education  ? ?Barriers to Discharge ?MD  Medical stability  ?Nursing Decreased caregiver support, Home environment access/layout, Incontinence, Wound Care, Trach, Lack of/limited family support, Insurance for SNF coverage, Weight, Medication compliance, Pending chemo/radiation, New oxygen, Nutrition means ?Discharging home with spouse.  Spouse works. Parents close by and can assist when spouse is working.  ?PT Inaccessible home environment, Behavior, New oxygen, Nutrition means, Other (comments) ?urinary and bowl frequency  ?OT Trach, New oxygen ?   ?SLP Lurline Idol, New oxygen ?   ?SW   ?   ? ?Team Discharge Planning: ?Destination: PT-Home ,OT- Home , SLP-Home ?Projected Follow-up: PT-Home health PT, OT-  Home health OT, SLP- (TBD) ?Projected Equipment Needs: PT-Rolling walker with 5" wheels, Wheelchair (  measurements), Wheelchair cushion (measurements), OT- 3 in 1 bedside comode, Tub/shower seat, To be determined, SLP-None recommended by SLP ?Equipment Details: PT-w/c type and measurements TBD, OT-  ?Patient/family involved in discharge planning: PT- Patient, Family member/caregiver,  OT-Patient, SLP-Patient, Family member/caregiver ? ?MD ELOS: 7-10 days ?Medical Rehab Prognosis:  Excellent ?Assessment: The patient has been admitted for CIR therapies with the diagnosis of pulmonary debility. The team will be addressing functional mobility, strength, stamina, balance, safety, adaptive techniques and equipment, self-care, bowel and bladder mgt, patient and caregiver education. Goals have been set at supervision. Anticipated discharge destination is home. ? ? ? ?See Team Conference Notes for weekly updates to the plan of care  ?

## 2021-06-18 LAB — CULTURE, FUNGUS WITHOUT SMEAR

## 2021-06-18 LAB — BASIC METABOLIC PANEL
Anion gap: 14 (ref 5–15)
BUN: 9 mg/dL (ref 6–20)
CO2: 28 mmol/L (ref 22–32)
Calcium: 9.5 mg/dL (ref 8.9–10.3)
Chloride: 93 mmol/L — ABNORMAL LOW (ref 98–111)
Creatinine, Ser: 0.5 mg/dL (ref 0.44–1.00)
GFR, Estimated: >60 mL/min (ref >60)
Glucose, Bld: 112 mg/dL — ABNORMAL HIGH (ref 70–99)
Potassium: 3.3 mmol/L — ABNORMAL LOW (ref 3.5–5.1)
Sodium: 135 mmol/L (ref 135–145)

## 2021-06-18 MED ORDER — POTASSIUM CHLORIDE CRYS ER 20 MEQ PO TBCR
40.0000 meq | EXTENDED_RELEASE_TABLET | Freq: Once | ORAL | Status: AC
  Administered 2021-06-18: 40 meq via ORAL
  Filled 2021-06-18: qty 2

## 2021-06-18 NOTE — Progress Notes (Signed)
Speech Language Pathology Daily Session Note ? ?Patient Details  ?Name: Karesa Maultsby ?MRN: [id number] ?Date of Birth: 10-Aug-1975 ? ?Today's Date: 06/18/2021 ?SLP Individual Time: 4098-1191 ?SLP Individual Time Calculation (min): 40 min ? ?Short Term Goals: ?Week 1: SLP Short Term Goal 1 (Week 1): Patient will consume current diet without overt s/s of aspiration with Mod I for use of swallowing compensatory strategies. ?SLP Short Term Goal 2 (Week 1): Patient will demonstrate functional problem solving for mildly complex tasks with supervision level verbal cues. ?SLP Short Term Goal 3 (Week 1): Patient will recall new, daily information with Mod A for use of compensatory strategies. ? ?Skilled Therapeutic Interventions: Skilled treatment session focused on cognitive goals. SLP facilitated session by providing a mildly complex calendar/appointment task. Patient completed task independently but reported fatigue upon completion. Patient had also independently initiated writing daily schedule in her memory notebook and recorded events from yesterday's sessions appropriately. SLP also initiated a medication management task with patient requiring Min verbal cues for recall of her new medications and their functions. Patient left upright in bed with husband present. Continue with current plan of care.  ?   ? ?Pain ?Pain Assessment ?Pain Scale: 0-10 ?Pain Score: 0-No pain ? ?Therapy/Group: Individual Therapy ? ?Jerusalem Brownstein ?06/18/2021, 12:21 PM ?

## 2021-06-18 NOTE — Progress Notes (Signed)
Husband at the bedside. Patient was concerned about the abdominal pain she was having around the PEG tube. On call provider called and an order placed for X-ray to check for placement.  ?

## 2021-06-18 NOTE — Progress Notes (Signed)
Occupational Therapy Session Note ? ?Patient Details  ?Name: Cassidy Tran ?MRN: [id number] ?Date of Birth: 27-Apr-1975 ? ?Today's Date: 06/18/2021 ?OT Individual Time: 9163-8466 ?OT Individual Time Calculation (min): 33 min  ? ? ?Short Term Goals: ?Week 1:  OT Short Term Goal 1 (Week 1): Pt will transfer to toilet with supervision and O2 remain >90% on supp O2 to dmeo improved hemodynamic stability ?OT Short Term Goal 2 (Week 1): Pt will complete 2/3 steps of toileting ?OT Short Term Goal 3 (Week 1): Pt will bathe at shower level with no more than min A ?OT Short Term Goal 4 (Week 1): Pt will need no more than min cuing for breathing strategies during funcitonal transfers to improve recall during transitional movements ? ?Skilled Therapeutic Interventions/Progress Updates:  ?  Pt in bed with her father present to start session.  Oxygen sats on 3Ls nasal cannula at 99%.  She was able to transfer to the EOB with modified independence and then ambulated with the RW at min assist up to the therapy gym on the 5th floor, greater than 75'.  Oxygen sats checked again post ambulation at 83% with HR at 110 BPM.  O2 increased up to 4Ls with purse lip breathing encouraged and sats increasing above 90% in less than 1 minute.  She next completed transfer over to the Nustep with min assist and performed 10 mins on level 4 resistance.  She started at 30 steps per minute increasing up to 60 steps per minute.  She rated a level 8 on the Borg Perceived Exertion Scale.  Finished session with ambulation back to the room at min assist level and transfer back to the bed.  Call button and phone in reach with nursing present.     ? ?Therapy Documentation ?Precautions:  ?Precautions ?Precautions: Fall ?Precaution Comments: (P) trach collar- capped, watch HR & O2 ?Restrictions ?Weight Bearing Restrictions: No ? ?Pain: ?Pain Assessment ?Pain Scale: Faces ?Pain Score: 0-No pain ?ADL: ?See Care Tool Section for some details of mobility and  selfcare ? ? ?Therapy/Group: Individual Therapy ? ?Daviel Allegretto OTR/L ?06/18/2021, 4:40 PM ?

## 2021-06-18 NOTE — Progress Notes (Signed)
PROGRESS NOTE   Subjective/Complaints:  Patient with husband at bedside.  Pt had concerns about pain administration of medications via PEG along with difficulty with tolerating oral potassium supplement.  She is having loose stools with 4 over 24 hrs, likely due to low potassium, while on potassium depleting diuretic-Lasix.  ROS: +pain with medications via PEG, +loose stools   Objective:   DG ABDOMEN PEG TUBE LOCATION  Result Date: 06/17/2021 CLINICAL DATA:  Peg tube check EXAM: ABDOMEN - 1 VIEW COMPARISON:  06/10/2021 FINDINGS: 30 cc Gastrografin contrast injected through the patient's gastrostomy tube. The tube projects over the body of the stomach. Contrast opacifies the fundus and proximal body of the stomach. No gross extravasation. Nonobstructed gas pattern. Right-sided shunt tubing. IMPRESSION: Gastrostomy tube projects over the body of the stomach. No gross extravasation. Electronically Signed   By: Jasmine Pang M.D.   On: 06/17/2021 21:22   Recent Labs    06/16/21 0322  WBC 13.0*  HGB 8.3*  HCT 27.1*  PLT 388   Recent Labs    06/16/21 0322 06/18/21 0546  NA 135 135  K 3.4* 3.3*  CL 96* 93*  CO2 30 28  GLUCOSE 119* 112*  BUN 7 9  CREATININE 0.55 0.50  CALCIUM 8.6* 9.5    Intake/Output Summary (Last 24 hours) at 06/18/2021 1616 Last data filed at 06/18/2021 1300 Gross per 24 hour  Intake 200 ml  Output --  Net 200 ml        Physical Exam: Vital Signs Blood pressure (!) 109/48, pulse 91, temperature 98.2 F (36.8 C), resp. rate 19, height  (1.6 m), weight 83.8 kg, last menstrual period 09/16/2012, SpO2 100 %.  Constitutional: No distress . Vital signs reviewed. +Fatigue. HEENT: NCAT, EOMI, oral membranes moist Neck: supple Cardiovascular: RRR without murmur. No JVD, HR 90's today  Respiratory/Chest: CTA Bilaterally without wheezes or rales. Normal effort    GI/Abdomen: BS +, non-tender,  non-distended, PEG site clean and dry with 1 cm midline incision, inferior skin edge separation showing granulation tissue Musculoskeletal: FROM, Rt upper arm PICC removed on 4/21 Neurological: Alert and oriented x 4. 3/5 strength throughout. Skin: Warm and dry. Ext: no clubbing, cyanosis, or edema Psych: pleasant and cooperative    Assessment/Plan: 1. Functional deficits which require 3+ hours per day of interdisciplinary therapy in a comprehensive inpatient rehab setting. Physiatrist is providing close team supervision and 24 hour management of active medical problems listed below. Physiatrist and rehab team continue to assess barriers to discharge/monitor patient progress toward functional and medical goals  Care Tool:  Bathing  Bathing activity did not occur: Refused Body parts bathed by patient: Abdomen, Chest, Left arm, Right upper leg, Left upper leg, Face   Body parts bathed by helper: Front perineal area, Buttocks     Bathing assist Assist Level: Minimal Assistance - Patient > 75%     Upper Body Dressing/Undressing Upper body dressing   What is the patient wearing?: Hospital gown only    Upper body assist Assist Level: Total Assistance - Patient < 25%    Lower Body Dressing/Undressing Lower body dressing    Lower body dressing activity did not occur:  Refused What is the patient wearing?: Pants     Lower body assist Assist for lower body dressing: Minimal Assistance - Patient > 75%     Toileting Toileting    Toileting assist Assist for toileting: Minimal Assistance - Patient > 75%     Transfers Chair/bed transfer  Transfers assist     Chair/bed transfer assist level: Contact Guard/Touching assist     Locomotion Ambulation   Ambulation assist      Assist level: Contact Guard/Touching assist Assistive device: Walker-rolling Max distance: 54ft   Walk 10 feet activity   Assist     Assist level: Contact Guard/Touching assist Assistive  device: Walker-rolling   Walk 50 feet activity   Assist Walk 50 feet with 2 turns activity did not occur: Safety/medical concerns  Assist level: Contact Guard/Touching assist Assistive device: Walker-rolling    Walk 150 feet activity   Assist Walk 150 feet activity did not occur: Safety/medical concerns         Walk 10 feet on uneven surface  activity   Assist Walk 10 feet on uneven surfaces activity did not occur: Safety/medical concerns         Wheelchair     Assist Is the patient using a wheelchair?: Yes Type of Wheelchair: Manual    Wheelchair assist level: Dependent - Patient 0%      Wheelchair 50 feet with 2 turns activity    Assist        Assist Level: Dependent - Patient 0%   Wheelchair 150 feet activity     Assist      Assist Level: Dependent - Patient 0%   Blood pressure (!) 109/48, pulse 91, temperature 98.2 F (36.8 C), resp. rate 19, height 5\' 3"  (1.6 m), weight 83.8 kg, last menstrual period 09/16/2012, SpO2 100 %.  Medical Problem List and Plan: 1. Functional deficits secondary to operative complications including respiratory failure status post right hemicolectomy for neuroendocrine tumor.             -patient may shower             -ELOS/Goals: 7-10 days                    Continue CIR 2.  Antithrombotics: -DVT/anticoagulation:  Pharmaceutical: Lovenox             -antiplatelet therapy: None 3. Abdominal pain with IV medication administration: Tylenol, oxycodone as needed. Placed nursing order to provide oxycodone 30 minutes prior to IV pain medication administration.  -4/22 Continue to utilize oxycodone 5-10 mg q4 hrs as needed for abdominal pain 4. Mood: LCSW to evaluate and provide emotional support             -- On scheduled Valium 4 mg 3 times daily for anxiety             -antipsychotic agents: n/a 5. Neuropsych: This patient is capable of making decisions on her own behalf. 6. Skin/Wound Care: Routine skin care  checks 7. Fluids/Electrolytes/Nutrition: Routine I's and O's and follow-up chemistries             -- Regular diet/thin liquids 4/17             -- Nocturnal tube feeds Osmolite 1.5 mL at 65 mL/h from 6 PM to 8 AM             -- SSI>>not requiring significant coverage -4/22 Tube feeds stopped to improve appetite 8: Tracheostomy 4/3>>now in 4CL, capped 4/18  Continuous pulse ox ordered.  -4/22 Continue continuous pulse ox 9: Volume overload: decrease Lasix to  BID. I/O ordered, daily weights ordered, tube feeds d/c.  -4/22 Continue Lasix 40 mg BID for volume overload while repleting K with 40 mEq Klor-Con daily, continue I/O, daily weights 11: ABLA: H and H low but stable; continue to monitor, follow-up CBC 12: Epilepsy>>no new seizure activity             --post-op encephalopathy likely due to Depakote, now on Keppra             --neuro rec: switching to zonasimide 100 mg daily 13: Chiari malformation s/p V-P shunt 2018             --ID work-up this admission; negative L-P 14: Von Willebrand disease: stable perioperative course 15: Neuroendocrine tumor s/p right hemicolectomy: follow-up with Dr. Myna Hidalgo 16: Biliary dyskinesia: s/p cholecystectomy             --diarrhea improved with Imodium, probiotics and cholestyramine>>no BM in 2-3 days so will discontinue Imodium and cholestyramine -4/22 Still complaints of loose stools likely r/t low potassium 3.3 (4/22).  Treat with Immodium   capsule po as needed, while correcting fluid overload with 40 mg Lasix BID with 40 mEq Klor-Con supplement 17. Tachycardia: increase coreg 3.125mg  to BID  -4/22 Improved with HR in the 90's, continue Coreg 3.125 mg BID 18. Decreased appetite: d/c TF- wean off TF helping to stimulate better appetite.  19. Hypokalemia: changed potassium to oral but she could not tolerate this- discontinued potassium as we are decreasing Lasix dose by half, repeat on Monday.  -4/22 Repleting K with 40 mEq Klor-Con  daily, while treating volume overload with Lasix 4 mg BID  -Continue to trend with BMET     Latest Ref Rng & Units 06/18/2021    5:46 AM 06/16/2021    3:22 AM 06/15/2021    5:10 AM  BMP  Glucose 70 - 99 mg/dL 952   841   324    BUN 6 - 20 mg/dL Creatinine 0.44 - 1.00 mg/dL 4.01   0.27   2.53    Sodium 135 - 145 mmol/L 135   135   138    Potassium 3.5 - 5.1 mmol/L 3.3   3.4   3.5    Chloride 98 - 111 mmol/L 93   96   99    CO2 22 - 32 mmol/L 28   30   33    Calcium 8.9 - 10.3 mg/dL 9.5   8.6   8.9         LOS: 3 days A FACE TO FACE EVALUATION WAS PERFORMED  Tressia Miners 06/18/2021, 4:16 PM

## 2021-06-18 NOTE — Progress Notes (Signed)
Occupational Therapy Session Note ? ?Patient Details  ?Name: Cassidy Tran ?MRN: [id number] ?Date of Birth: 03/20/1975 ? ?Today's Date: 06/18/2021 ?OT Individual Time: 0865-7846 ?OT Individual Time Calculation (min): 71 min  ? ? ?Short Term Goals: ?Week 1:  OT Short Term Goal 1 (Week 1): Pt will transfer to toilet with supervision and O2 remain >90% on supp O2 to dmeo improved hemodynamic stability ?OT Short Term Goal 2 (Week 1): Pt will complete 2/3 steps of toileting ?OT Short Term Goal 3 (Week 1): Pt will bathe at shower level with no more than min A ?OT Short Term Goal 4 (Week 1): Pt will need no more than min cuing for breathing strategies during funcitonal transfers to improve recall during transitional movements ? ?Skilled Therapeutic Interventions/Progress Updates:  ?   ?Pt received in bed with no pain at beginning but mild nausea at end of session with improvement after attempting to toilet.   ? ?ADL: ?Pt completes ADL at overall CGA-MIN Level. Skilled interventions include: monitoring HR/O2 sats with significant improvement in hemodynamic stability this date. Pt with O2 on 3L for full ADL at EOB including toileting with O2 only dipping 2x below 90% bouncing back with pursed lip breathing/cuing in less than 1 min. During prolonged standing at sink with CGA for grooming pt on 4L remaining >89%. Of note, pt requires prolonged seated rest breaks in between taks d/t severe deconditioning and pt very tearful with this clinician stating, "For a little while when I was upstairs I wanted to give up, but one day I decided I couldn't" and "Im very worried about work/if I will have a job to go back to and how we will pay bills."  Provided support and encouragement to pt as well as validating feelings. Pt educated on role of CSW to help navigate paperwork as well as neuropsych. Pt agreeable to speak with neuropsych for coping.  ? ? ?Therapeutic activity ?Pt escorted to outside courtyard for mood support. Pt and OT  discuss bathroom/home set up while seated on 3L. Pulse oxymeter malfunctioning therefore did not mobilize with no accurate way to measure O2 sats.  ? ?Pt left at end of session in bed with exit alarm on, call light in reach and all needs met ? ? ?Therapy Documentation ?Precautions:  ?Precautions ?Precautions: (P) Fall ?Precaution Comments: (P) trach collar- capped, watch HR & O2 ?Restrictions ?Weight Bearing Restrictions: No ?General: ?  ?Vital Signs: ? ? ?Therapy/Group: Individual Therapy ? ?Lowella Dell Darrien Belter ?06/18/2021, 6:54 AM ?

## 2021-06-18 NOTE — Progress Notes (Signed)
Physical Therapy Session Note ? ?Patient Details  ?Name: Cassidy Tran ?MRN: [id number] ?Date of Birth: 12/25/1975 ? ?Today's Date: 06/18/2021 ?PT Individual Time: 0761-5183 ?PT Individual Time Calculation (min): 69 min  ? ?Short Term Goals: ?Week 1:  PT Short Term Goal 1 (Week 1): Patient will perform basic transfers with CGA consistently using LRAD. ?PT Short Term Goal 2 (Week 1): Patient will ambulate >50 feet using LRAD maintaining O2 saturations >90% with <6L/min O2. ?PT Short Term Goal 3 (Week 1): Patient will initiate stair training. ? ?Skilled Therapeutic Interventions/Progress Updates:  ? ?Pt received supine in bed and agreeable to PT. Supine>sit transfer with supervision  assist and cues for O2 management. Stand pivot transfers performed with supervision assist throughout session with RW and cues for step height on the RLE. Pt transported to entrance of [facility] in [place]. Sit<>stand x 5 with O2 on 3L/min >92% throughout. Gait training on 4l/min O2 x 95f with cues for step height on the RLE due to noted foot drop. SpO2 >95% throughout on 4L. Pt placed on 3L/min throughout the remainder of the session with SpO2 >93%.  ?Nustep reciprocal movementand endurance  training x 10 min with multiple therapeutic rest breaks due to BLE fatigue and mild SOB but no change in O2 sats.  ?Standing tolerance while engaged in Wii bowling. Pt able to tolerate only 1 frame at a time x 4 frames throughout game due to BLE fatigue. Sit<>stand with supervision assist throughout with UE support for safety on RW. Pt returned to room and performed stand piovt transfer to bed with RW and and supervision assist. Sit>supine completed without assist, and left supine in bed with call bell in reach and all needs met.  ? ? ?   ? ?Therapy Documentation ?Precautions:  ?Precautions ?Precautions: Fall ?Precaution Comments: (P) trach collar- capped, watch HR & O2 ?Restrictions ?Weight Bearing Restrictions: No ? ?Vital Signs: ?Therapy Vitals ?Temp: 98.2  ?F (36.8 ?C) ?Pulse Rate: 91 ?Resp: 19 ?BP: (!) 109/48 ?Patient Position (if appropriate): Lying ?Oxygen Therapy ?SpO2: 100 % ?O2 Device: Room Air ?Pain: ?Pain Assessment ?Pain Scale: Faces ?Pain Score: 0-No pain ? ? ? ? ?Therapy/Group: Individual Therapy ? ?ALorie Phenix?06/18/2021, 5:00 PM  ?

## 2021-06-19 LAB — BASIC METABOLIC PANEL
Anion gap: 11 (ref 5–15)
BUN: 7 mg/dL (ref 6–20)
CO2: 31 mmol/L (ref 22–32)
Calcium: 9.1 mg/dL (ref 8.9–10.3)
Chloride: 95 mmol/L — ABNORMAL LOW (ref 98–111)
Creatinine, Ser: 0.63 mg/dL (ref 0.44–1.00)
GFR, Estimated: >60 mL/min (ref >60)
Glucose, Bld: 122 mg/dL — ABNORMAL HIGH (ref 70–99)
Potassium: 3.2 mmol/L — ABNORMAL LOW (ref 3.5–5.1)
Sodium: 137 mmol/L (ref 135–145)

## 2021-06-19 MED ORDER — DIPHENOXYLATE-ATROPINE 2.5-0.025 MG/5ML PO LIQD
5.0000 mL | Freq: Four times a day (QID) | ORAL | Status: DC
  Administered 2021-06-19 – 2021-06-21 (×7): 5 mL via ORAL
  Filled 2021-06-19 (×10): qty 5

## 2021-06-19 MED ORDER — POTASSIUM CHLORIDE CRYS ER 20 MEQ PO TBCR
40.0000 meq | EXTENDED_RELEASE_TABLET | Freq: Two times a day (BID) | ORAL | Status: DC
Start: 2021-06-19 — End: 2021-06-23
  Administered 2021-06-19 – 2021-06-23 (×4): 40 meq via ORAL
  Filled 2021-06-19 (×6): qty 2

## 2021-06-19 NOTE — Progress Notes (Signed)
Physical Therapy Session Note ? ?Patient Details  ?Name: Cassidy Tran ?MRN: [id number] ?Date of Birth: 06-03-1975 ? ?Today's Date: 06/19/2021 ?PT Individual Time: 1100-1200 ?PT Individual Time Calculation (min): 60 min  ? ?Short Term Goals: ?Week 1:  PT Short Term Goal 1 (Week 1): Patient will perform basic transfers with CGA consistently using LRAD. ?PT Short Term Goal 2 (Week 1): Patient will ambulate >50 feet using LRAD maintaining O2 saturations >90% with <6L/min O2. ?PT Short Term Goal 3 (Week 1): Patient will initiate stair training. ? ?Skilled Therapeutic Interventions/Progress Updates:  ?   ?Pt supine in bed with LPN at bedside administering rx - pt agreeable to PT tx and denies pain. On 1.5L o2 via  resting 99%. Supine<>sitting EOB completed at mod I level with hospital bed features. Sitting balance with distant supervision. Completed sit<>stand to RW with supervision and stand<>pivot transfer to w/c with supervision and RW. Retrieved new O2 tank (tank in room empty) and switched from wall O2 to portable O2. Pt remained on 3L during treatment session. Pt transported downstairs to 81M rehab gym for time management and energy conservation.  ? ?Instructed in 6MWT to assess cardiovascular endurance. She ambulated a total of 110f (357f+ 10062fwith supervision and RW, PT assisting with line management and providing w/c follow for safety and fatigue. O2 desaturates to 90% after test but recovers quickly with seated rest. ? ?Pt then instructed in BERG balance test. See below for details. Unfortunately, we we're not able to complete full BERG test due to toilet urgency for bowel movement.  ? ?In ADL apartment, completed ambulatory transfer with supervision and RW to toilet and pt continent of B & [place], able to complete pericare without assist. Some decreased safety awareness with finished as she stood without prompting and was attempting to stand<>pivot to sink without her RW and oxygen tangled. Education on  importance of safety and fall prevention and staff supervision for all mobility.  ? ?Returned upstairs to her room in w/c for time management and completed stand<>pivot transfer with supervision and RW to EOB. Bed mobility mod I. Reconnected to wall O2 1.5L reading 98%. All needs met and bed alarm on. ? ?Therapy Documentation ?Precautions:  ?Precautions ?Precautions: Fall ?Precaution Comments: (P) trach collar- capped, watch HR & O2 ?Restrictions ?Weight Bearing Restrictions: No ?General: ?  ? ?Balance: ?Balance ?Balance Assessed: Yes ?Standardized Balance Assessment ?Standardized Balance Assessment: Berg Balance Test ?BerMerrilee Janskylance Test ?Sit to Stand: Able to stand without using hands and stabilize independently ?Standing Unsupported: Able to stand 2 minutes with supervision ?Sitting with Back Unsupported but Feet Supported on Floor or Stool: Able to sit safely and securely 2 minutes ?Stand to Sit: Sits safely with minimal use of hands ?Transfers: Able to transfer safely, definite need of hands ?Standing Unsupported with Eyes Closed: Able to stand 10 seconds with supervision ?Standing Ubsupported with Feet Together: Needs help to attain position but able to stand for 30 seconds with feet together ?From Standing, Reach Forward with Outstretched Arm: Reaches forward but needs supervision ?From Standing Position, Pick up Object from Floor: Unable to try/needs assist to keep balance ?**UNABLE TO COMPLETE FULL BERG ASSESSMENT DUE TO TOILETING NEEDS ? ? ?Therapy/Group: Individual Therapy ? ?Savreen Gebhardt P Zissel Biederman ?06/19/2021, 7:30 AM  ?

## 2021-06-19 NOTE — Progress Notes (Signed)
BMET resulted significant for hypokalemia to 3.2 mEq/L on call NP notified no new orders at this time will continue current plan of care  ?

## 2021-06-19 NOTE — Progress Notes (Signed)
PROGRESS NOTE   Subjective/Complaints:  Patient with husband at bedside. She is having loose stools with 8 over 24 hrs, likely due to low potassium, while on potassium depleting diuretic-Lasix.  ROS: +pain with medications via PEG, +loose stools   Objective:   DG ABDOMEN PEG TUBE LOCATION  Result Date: 06/17/2021 CLINICAL DATA:  Peg tube check EXAM: ABDOMEN - 1 VIEW COMPARISON:  06/10/2021 FINDINGS: 30 cc Gastrografin contrast injected through the patient's gastrostomy tube. The tube projects over the body of the stomach. Contrast opacifies the fundus and proximal body of the stomach. No gross extravasation. Nonobstructed gas pattern. Right-sided shunt tubing. IMPRESSION: Gastrostomy tube projects over the body of the stomach. No gross extravasation. Electronically Signed   By: Jasmine Pang M.D.   On: 06/17/2021 21:22   No results for input(s): WBC, HGB, HCT, PLT in the last 72 hours.  Recent Labs    06/18/21 0546 06/19/21 1036  NA 135 137  K 3.3* 3.2*  CL 93* 95*  CO2 28 31  GLUCOSE 112* 122*  BUN 9 7  CREATININE 0.50 0.63  CALCIUM 9.5 9.1    Intake/Output Summary (Last 24 hours) at 06/19/2021 1340 Last data filed at 06/19/2021 0800 Gross per 24 hour  Intake 600 ml  Output --  Net 600 ml        Physical Exam: Vital Signs Blood pressure (!) 107/54, pulse 93, temperature 98.3 F (36.8 C), temperature source Oral, resp. rate 18, height  (1.6 m), weight 84.1 kg, last menstrual period 09/16/2012, SpO2 98 %.  Constitutional: No distress . Vital signs reviewed. +Fatigue. HEENT: NCAT, EOMI, oral membranes moist Neck: supple Cardiovascular: RRR without murmur. No JVD, HR 90's today  Respiratory/Chest: CTA Bilaterally without wheezes or rales. Normal effort    GI/Abdomen: BS +, non-tender, non-distended, PEG site clean and dry with 1 cm midline incision, inferior skin edge separation showing granulation  tissue Musculoskeletal: FROM, Rt upper arm PICC removed on 4/21 Neurological: Alert and oriented x 4. 3/5 strength throughout. Skin: Warm and dry. Ext: no clubbing, cyanosis, or edema Psych: pleasant and cooperative   PE: Unchanged since 06/18/21   Assessment/Plan: 1. Functional deficits which require 3+ hours per day of interdisciplinary therapy in a comprehensive inpatient rehab setting. Physiatrist is providing close team supervision and 24 hour management of active medical problems listed below. Physiatrist and rehab team continue to assess barriers to discharge/monitor patient progress toward functional and medical goals  Care Tool:  Bathing  Bathing activity did not occur: Refused Body parts bathed by patient: Abdomen, Chest, Left arm, Right upper leg, Left upper leg, Face   Body parts bathed by helper: Front perineal area, Buttocks     Bathing assist Assist Level: Minimal Assistance - Patient > 75%     Upper Body Dressing/Undressing Upper body dressing   What is the patient wearing?: Hospital gown only    Upper body assist Assist Level: Total Assistance - Patient < 25%    Lower Body Dressing/Undressing Lower body dressing    Lower body dressing activity did not occur: Refused What is the patient wearing?: Pants     Lower body assist Assist for lower body dressing:  Minimal Assistance - Patient > 75%     Editor, commissioning assist Assist for toileting: Minimal Assistance - Patient > 75%     Transfers Chair/bed transfer  Transfers assist     Chair/bed transfer assist level: Contact Guard/Touching assist     Locomotion Ambulation   Ambulation assist      Assist level: Contact Guard/Touching assist Assistive device: Walker-rolling Max distance: 70ft   Walk 10 feet activity   Assist     Assist level: Contact Guard/Touching assist Assistive device: Walker-rolling   Walk 50 feet activity   Assist Walk 50 feet with 2 turns  activity did not occur: Safety/medical concerns  Assist level: Contact Guard/Touching assist Assistive device: Walker-rolling    Walk 150 feet activity   Assist Walk 150 feet activity did not occur: Safety/medical concerns         Walk 10 feet on uneven surface  activity   Assist Walk 10 feet on uneven surfaces activity did not occur: Safety/medical concerns         Wheelchair     Assist Is the patient using a wheelchair?: Yes Type of Wheelchair: Manual    Wheelchair assist level: Dependent - Patient 0%      Wheelchair 50 feet with 2 turns activity    Assist        Assist Level: Dependent - Patient 0%   Wheelchair 150 feet activity     Assist      Assist Level: Dependent - Patient 0%   Blood pressure (!) 107/54, pulse 93, temperature 98.3 F (36.8 C), temperature source Oral, resp. rate 18, height  (1.6 m), weight 84.1 kg, last menstrual period 09/16/2012, SpO2 98 %.  Medical Problem List and Plan: 1. Functional deficits secondary to operative complications including respiratory failure status post right hemicolectomy for neuroendocrine tumor.             -patient may shower             -ELOS/Goals: 7-10 days                    Continue CIR 2.  Antithrombotics: -DVT/anticoagulation:  Pharmaceutical: Lovenox             -antiplatelet therapy: None 3. Abdominal pain with IV medication administration: Tylenol, oxycodone as needed. Placed nursing order to provide oxycodone 30 minutes prior to IV pain medication administration.  -4/22 Continue to utilize oxycodone 5-10 mg q4 hrs as needed for abdominal pain 4. Mood: LCSW to evaluate and provide emotional support             -- On scheduled Valium 4 mg 3 times daily for anxiety             -antipsychotic agents: n/a 5. Neuropsych: This patient is capable of making decisions on her own behalf. 6. Skin/Wound Care: Routine skin care checks 7. Fluids/Electrolytes/Nutrition: Routine I's and O's  and follow-up chemistries             -- Regular diet/thin liquids 4/17             -- Nocturnal tube feeds Osmolite 1.5 mL at 65 mL/h from 6 PM to 8 AM             -- SSI>>not requiring significant coverage -4/22 Tube feeds stopped to improve appetite 8: Tracheostomy 4/3>>now in 4CL, capped 4/18             Continuous pulse ox ordered.  -  4/22 Continue continuous pulse ox -4/23 Pulse ox O2 stable 9: Volume overload: decrease Lasix to 40mg  BID. I/O ordered, daily weights ordered, tube feeds d/c.  -4/22 Continue Lasix 40 mg BID for volume overload while repleting K with 40 mEq Klor-Con daily, continue I/O, daily weights -4/23 continue plan above to treat volume overload. 11: ABLA: H and H low but stable; continue to monitor, follow-up CBC 12: Epilepsy>>no new seizure activity             --post-op encephalopathy likely due to Depakote, now on Keppra             --neuro rec: switching to zonasimide 100 mg daily 13: Chiari malformation s/p V-P shunt 2018             --ID work-up this admission; negative L-P 14: Von Willebrand disease: stable perioperative course 15: Neuroendocrine tumor s/p right hemicolectomy: follow-up with Dr. Myna Hidalgo 16: Biliary dyskinesia: s/p cholecystectomy             --diarrhea improved with Imodium, probiotics and cholestyramine>>no BM in 2-3 days so will discontinue Imodium and cholestyramine -4/22 Still complaints of loose stools likely r/t low potassium 3.3 (4/22).  Treat with Immodium  2mg  capsule po as needed, while correcting fluid overload with 40 mg Lasix BID with 40 mEq Klor-Con supplement -4/23 Patient had 10 stools overnight with 2 formed and 8 loose. 4/23 Lomotil 5 ml added QID along with prn Imodium 2 mg prn.  Continue to monitor while repleting K with Klor-Con and monitoring BMET.  17. Tachycardia: increase coreg 3.125mg  to BID  -4/22 Improved with HR in the 90's, continue Coreg 3.125 mg BID -4/23 HR stable in the 90's on current regimen Coreg 3.125 mg  BID     06/19/2021    5:08 AM 06/19/2021    5:03 AM 06/18/2021    9:35 PM  Vitals with BMI  Weight 185 lbs 7 oz    BMI 32.85    Systolic  107 112  Diastolic  54 48  Pulse  93 91    18. Decreased appetite: d/c TF- wean off TF helping to stimulate better appetite.  19. Hypokalemia: changed potassium to oral but she could not tolerate this- discontinued potassium as we are decreasing Lasix dose by half, repeat on Monday.  -4/22 Repleting K with 40 mEq Klor-Con daily, while treating volume overload with Lasix 4 mg BID  -Continue to trend with BMET 4/23 Potassium 3.2 today, replete with 40 mEq Klor-Con and 4/24 am BMET     Latest Ref Rng & Units 06/19/2021   10:36 AM 06/18/2021    5:46 AM 06/16/2021    3:22 AM  BMP  Glucose 70 - 99 mg/dL 956   213   086    BUN 6 - 20 mg/dL 7   9   7     Creatinine 0.44 - 1.00 mg/dL 5.78   4.69   6.29    Sodium 135 - 145 mmol/L 137   135   135    Potassium 3.5 - 5.1 mmol/L 3.2   3.3   3.4    Chloride 98 - 111 mmol/L 95   93   96    CO2 22 - 32 mmol/L 31   28   30     Calcium 8.9 - 10.3 mg/dL 9.1   9.5   8.6         LOS: 4 days A FACE TO FACE EVALUATION WAS PERFORMED  Tressia Miners 06/19/2021,  1:40 PM

## 2021-06-20 DIAGNOSIS — R5381 Other malaise: Principal | ICD-10-CM

## 2021-06-20 DIAGNOSIS — E876 Hypokalemia: Secondary | ICD-10-CM

## 2021-06-20 DIAGNOSIS — D62 Acute posthemorrhagic anemia: Secondary | ICD-10-CM

## 2021-06-20 DIAGNOSIS — R197 Diarrhea, unspecified: Secondary | ICD-10-CM

## 2021-06-20 LAB — BASIC METABOLIC PANEL
Anion gap: 10 (ref 5–15)
BUN: 5 mg/dL — ABNORMAL LOW (ref 6–20)
CO2: 30 mmol/L (ref 22–32)
Calcium: 9.2 mg/dL (ref 8.9–10.3)
Chloride: 96 mmol/L — ABNORMAL LOW (ref 98–111)
Creatinine, Ser: 0.59 mg/dL (ref 0.44–1.00)
GFR, Estimated: >60 mL/min (ref >60)
Glucose, Bld: 108 mg/dL — ABNORMAL HIGH (ref 70–99)
Potassium: 3.2 mmol/L — ABNORMAL LOW (ref 3.5–5.1)
Sodium: 136 mmol/L (ref 135–145)

## 2021-06-20 MED ORDER — CHOLESTYRAMINE 4 G PO PACK
4.0000 g | PACK | Freq: Two times a day (BID) | ORAL | Status: DC
  Administered 2021-06-20 – 2021-06-21 (×2): 4 g via ORAL
  Filled 2021-06-20 (×3): qty 1

## 2021-06-20 NOTE — Progress Notes (Signed)
Physical Therapy Session Note ? ?Patient Details  ?Name: Cassidy Tran ?MRN: [id number] ?Date of Birth: 1975/12/02 ? ?Today's Date: 06/20/2021 ?PT Individual Time: 8250-0370 ?PT Individual Time Calculation (min): 65 min  ? ?Short Term Goals: ?Week 1:  PT Short Term Goal 1 (Week 1): Patient will perform basic transfers with CGA consistently using LRAD. ?PT Short Term Goal 2 (Week 1): Patient will ambulate >50 feet using LRAD maintaining O2 saturations >90% with <6L/min O2. ?PT Short Term Goal 3 (Week 1): Patient will initiate stair training. ? ?Skilled Therapeutic Interventions/Progress Updates:  ?   ?Patient in bed on 2L/min O2 upon PT arrival. Patient alert and agreeable to PT session. Patient reported 8/10 PEG site pain during session, patient declined pain medication, states she does not like how she feels when she takes them. PT provided repositioning, rest breaks, and distraction as pain interventions throughout session.  ? ?On inspection patient with moderate amount of wet purulent grey drainage with foul smell on dressing over PEG site. RN made aware, patient declined mobility until dressing was addressed. RN made aware and changed dressing and cleaned wound during session, Patient missed 10 min of skilled PT due to nursing/wound care, RN made aware. Will attempt to make-up missed time as able.   ? ?Patient reported fatigue from frequent toilet transfers throughout the day, performed x2 toilet transfers due to bowl urgency, without successful BM but successful voids during session. Reports difficulty determining sensations for bladder versus bowl urgency and 2-3 transfers to the [facility] per hour with voiding each time and BM at least 1 per hour.  ? ?Patient remained on 2L/min O2 throughout session with SPO2 >90%, HR 100-110 bpm.  ? ?Therapeutic Activity: ?Bed Mobility: Patient performed supine to/from sit with supervision with HOB elevated per home set-up, patient has wedge that she sleeps on. Provided verbal  cues for progressing through side-lying to prevent increased abdominal pain. ?Transfers: Patient performed stand pivot bed<>BSC and bed<>w/c with CGA without and AD with x1 minor LOB while turning. Provided verbal cues for line management and erect posture for improved balance during transfers. Patient was continent of bladder x2, performed peri-care with set-up assist and lower body clothing management independently.  ? ?Patient requested to go outside during session, cut concerned about bowl urgency. Transported patient to the [employer] for a view of outside. Patient with improved affect for >4 min discussing returning home and being outside with her dog, before needing to return to the room due to concerns for BM that resolved once patient was back in the room. ? ?Discussed d/c planning and PT goals, patient reports she would prefer to go home on oxygen if it means she can go home sooner. Set goal to perform car transfer and stairs tomorrow to address home access at d/c.  ? ?Patient in bed at end of session with breaks locked, bed alarm set, and all needs within reach.  ? ?Therapy Documentation ?Precautions:  ?Precautions ?Precautions: Fall ?Precaution Comments: (P) trach collar- capped, watch HR & O2 ?Restrictions ?Weight Bearing Restrictions: No ?General: ?PT Amount of Missed Time (min): 10 Minutes ?PT Missed Treatment Reason: Pain ? ? ? ?Therapy/Group: Individual Therapy ? ?Doreene Burke PT, DPT ? ?06/20/2021, 8:17 PM  ?

## 2021-06-20 NOTE — Progress Notes (Signed)
Occupational Therapy Session Note ? ?Patient Details  ?Name: Cassidy Tran ?MRN: [id number] ?Date of Birth: Jul 24, 1975 ? ?Today's Date: 06/20/2021 ?OT Individual Time: 4431-5400 ?OT Individual Time Calculation (min): 50 min  and Today's Date: 06/20/2021 ?OT Missed Time: 10 Minutes ?Missed Time Reason: Patient fatigue ? ? ?Short Term Goals: ?Week 1:  OT Short Term Goal 1 (Week 1): Pt will transfer to toilet with supervision and O2 remain >90% on supp O2 to dmeo improved hemodynamic stability ?OT Short Term Goal 2 (Week 1): Pt will complete 2/3 steps of toileting ?OT Short Term Goal 3 (Week 1): Pt will bathe at shower level with no more than min A ?OT Short Term Goal 4 (Week 1): Pt will need no more than min cuing for breathing strategies during funcitonal transfers to improve recall during transitional movements ? ?Skilled Therapeutic Interventions/Progress Updates:  ?  Pt received supine with no c/o pain, agreeable to OT session. She was on 2L O2 via [place] at rest with SpO2 at 94%. She completed bed mobility to EOB and transferred to the [facility] with CGA. She voided urine and completed all toileting tasks with CGA overall. After lengthy discussion she was agreeable to begin session by washing her hair at the sink. She required max A to perform hair care d/t large matted section. OT attempted to untangle hair but was largely unsuccessful. Pt emotional re disagreement between her mother and her re decision to cut hair vs not. Provided emotional support and mediation as needed. She then completed 4 trials of 50 ft of functional mobility with w/c follow by her dad and on 3L O2. Her vitals were relatively stable, with desaturation to high 80's and recovery with pursed lip breathing and frequent rest breaks. She required RW use and CGA overall during mobility. She returned to her room and transferred back to supine. She was left with all needs met, bed alarm set.  ? ? ?Therapy Documentation ?Precautions:   ?Precautions ?Precautions: Fall ?Precaution Comments: (P) trach collar- capped, watch HR & O2 ?Restrictions ?Weight Bearing Restrictions: No ? ? ?Therapy/Group: Individual Therapy ? ?Curtis Sites ?06/20/2021, 6:36 AM ?

## 2021-06-20 NOTE — Progress Notes (Addendum)
?                                                       PROGRESS NOTE ? ? ?Subjective/Complaints: ? ?Still having frequent loose stools. No fever/chills, no significant odor. Feels worn down from stooling however.  ? ?ROS: Patient denies fever, rash, sore throat, blurred vision, dizziness, nausea, vomiting,  cough, shortness of breath or chest pain,  headache, or mood change.  ? ? ?Objective: ?  ?No results found. ?No results for input(s): WBC, HGB, HCT, PLT in the last 72 hours. ? ?Recent Labs  ?  06/19/21 ?1036 06/20/21 ?6967  ?NA 137 136  ?K 3.2* 3.2*  ?CL 95* 96*  ?CO2 31 30  ?GLUCOSE 122* 108*  ?BUN 7 5*  ?CREATININE 0.63 0.59  ?CALCIUM 9.1 9.2  ? ? ?Intake/Output Summary (Last 24 hours) at 06/20/2021 1008 ?Last data filed at 06/19/2021 2100 ?Gross per 24 hour  ?Intake 420 ml  ?Output --  ?Net 420 ml  ?  ? ?  ? ?Physical Exam: ?Vital Signs ?Blood pressure (!) 119/57, pulse 89, temperature 98.6 ?F (37 ?C), temperature source Oral, resp. rate 19, height '5\' 3"'$  (1.6 m), weight 84.2 kg, last menstrual period 09/16/2012, SpO2 99 %. ? ?Constitutional: fatigued appearing. Vital signs reviewed. ?HEENT: NCAT, EOMI, oral membranes moist ?Neck: supple ?Cardiovascular: RRR without murmur. No JVD    ?Respiratory/Chest: CTA Bilaterally with exp wheezes . Normal effort    ?GI/Abdomen: BS +, some tenderness near PEG, no abnl drainage or discoloration ?Ext: no clubbing, cyanosis, or edema ?Psych: appears a little down ?Musculoskeletal: FROM, Rt upper arm PICC removed on 4/21 ?Neurological: Alert and oriented x 4. 3/5 strength throughout. ?Skin: Warm and dry. ?Ext: no clubbing, cyanosis, or edema ?  ? ? ?Assessment/Plan: ?1. Functional deficits which require 3+ hours per day of interdisciplinary therapy in a comprehensive inpatient rehab setting. ?Physiatrist is providing close team supervision and 24 hour management of active medical problems listed below. ?Physiatrist and rehab team continue to assess barriers to  discharge/monitor patient progress toward functional and medical goals ? ?Care Tool: ? ?Bathing ? Bathing activity did not occur: Refused ?Body parts bathed by patient: Abdomen, Chest, Left arm, Right upper leg, Left upper leg, Face  ? Body parts bathed by helper: Front perineal area, Buttocks ?  ?  ?Bathing assist Assist Level: Minimal Assistance - Patient > 75% ?  ?  ?Upper Body Dressing/Undressing ?Upper body dressing   ?What is the patient wearing?: [place] only ?   ?Upper body assist Assist Level: Total Assistance - Patient < 25% ?   ?Lower Body Dressing/Undressing ?Lower body dressing ? ? ? Lower body dressing activity did not occur: Refused ?What is the patient wearing?: Pants ? ?  ? ?Lower body assist Assist for lower body dressing: Minimal Assistance - Patient > 75% ?   ? ?Toileting ?Toileting    ?Toileting assist Assist for toileting: Minimal Assistance - Patient > 75% ?  ?  ?Transfers ?Chair/bed transfer ? ?Transfers assist ?   ? ?Chair/bed transfer assist level: Contact Guard/Touching assist ?  ?  ?Locomotion ?Ambulation ? ? ?Ambulation assist ? ?   ? ?Assist level: Contact Guard/Touching assist ?Assistive device: Walker-rolling ?Max distance: 56f  ? ?Walk 10 feet activity ? ? ?Assist ?   ? ?Assist level:  Contact Guard/Touching assist ?Assistive device: Walker-rolling  ? ?Walk 50 feet activity ? ? ?Assist Walk 50 feet with 2 turns activity did not occur: Safety/medical concerns ? ?Assist level: Contact Guard/Touching assist ?Assistive device: Walker-rolling  ? ? ?Walk 150 feet activity ? ? ?Assist Walk 150 feet activity did not occur: Safety/medical concerns ? ?  ?  ?  ? ?Walk 10 feet on uneven surface  ?activity ? ? ?Assist Walk 10 feet on uneven surfaces activity did not occur: Safety/medical concerns ? ? ?  ?   ? ?Wheelchair ? ? ? ? ?Assist Is the patient using a wheelchair?: Yes ?Type of Wheelchair: Manual ?  ? ?Wheelchair assist level: Dependent - Patient 0% ?   ? ? ?Wheelchair 50 feet with 2  turns activity ? ? ? ?Assist ? ?  ?  ? ? ?Assist Level: Dependent - Patient 0%  ? ?Wheelchair 150 feet activity  ? ? ? ?Assist ?   ? ? ?Assist Level: Dependent - Patient 0%  ? ?Blood pressure (!) 119/57, pulse 89, temperature 98.6 ?F (37 ?C), temperature source Oral, resp. rate 19, height '5\' 3"'$  (1.6 m), weight 84.2 kg, last menstrual period 09/16/2012, SpO2 99 %. ? ?Medical Problem List and Plan: ?1. Functional deficits secondary to operative complications including respiratory failure status post right hemicolectomy for neuroendocrine tumor. ?            -patient may shower ?            -ELOS/Goals: 7-10 days        ?            -Continue CIR therapies including PT, OT  ?2.  Antithrombotics: ?-DVT/anticoagulation:  Pharmaceutical: Lovenox ?            -antiplatelet therapy: None ?3. Abdominal pain with IV medication administration: Tylenol, oxycodone as needed.   ?-4/24 Continue to utilize oxycodone 5-10 mg q4 hrs as needed for abdominal pain ?4. Mood: LCSW to evaluate and provide emotional support ?            -- On scheduled Valium 4 mg 3 times daily for anxiety ?            -antipsychotic agents: n/a ?5. Neuropsych: This patient is capable of making decisions on her own behalf. ?6. Skin/Wound Care: Routine skin care checks ?7. Fluids/Electrolytes/Nutrition: Routine I's and O's and follow-up chemistries ?            -- Regular diet/thin liquids 4/17 ?            -- Nocturnal tube feeds Osmolite 1.5 mL at 65 mL/h from 6 PM to 8 AM ?            -- SSI>>not requiring significant coverage ?-4/22 Tube feeds stopped to improve appetite ?8: Tracheostomy 4/3>>now in 4CL, capped 4/18 ?            -trach out.  ?9: Volume overload:   ?Filed Weights  ? 06/18/21 0643 06/19/21 0508 06/20/21 0500  ?Weight: 83.8 kg 84.1 kg 84.2 kg  ?  -weights down from admission ? -does not appear edematous ? -lasix exacerbating hypokalemia---hold lasix for now ?11: ABLA: H and H low but stable; continue to monitor, follow-up CBC 4/25 ?12:  Epilepsy>>no new seizure activity ?            --post-op encephalopathy likely due to Depakote, now on Keppra ?            --neuro rec: switching  to zonasimide 100 mg daily ?13: Chiari malformation s/p V-P shunt 2018 ?            --ID work-up this admission; negative L-P ?14: Von Willebrand disease: stable perioperative course ?15: Neuroendocrine tumor s/p right hemicolectomy: follow-up with Dr. Marin Olp ?16: Biliary dyskinesia: s/p cholecystectomy ?            -continued diarrhea, does not have characteristics of c diff per pt/rn ? -resume questran, continue florastor, imodium ? -hold prosource via tube ? -encourage po ? -short gut symptoms ?17. Tachycardia: increase coreg 3.'125mg'$  to BID  ?-4/22 Improved with HR in the 90's, continue Coreg 3.125 mg BID ?-4/24 HR stable in the 90's on current regimen Coreg 3.125 mg BID ? ? ?  06/20/2021  ?  5:14 AM 06/20/2021  ?  5:00 AM 06/19/2021  ?  8:53 PM  ?Vitals with BMI  ?Weight  185 lbs 10 oz   ?BMI  32.89   ?Systolic 638  177  ?Diastolic 57  31  ?Pulse 89  94  ?  ?18. Decreased appetite: encourage po  ?19. Hypokalemia: changed potassium to oral but she could not tolerate this- discontinued potassium as we are decreasing Lasix dose by half, repeat on Monday.  ?-4/24-potassium still low/borderline ? -continue klor-con ? -lasix stopped ? ?  Latest Ref Rng & Units 06/20/2021  ?  5:17 AM 06/19/2021  ? 10:36 AM 06/18/2021  ?  5:46 AM  ?BMP  ?Glucose 70 - 99 mg/dL 108   122   112    ?BUN 6 - 20 mg/dL '5   7   9    '$ ?Creatinine 0.44 - 1.00 mg/dL 0.59   0.63   0.50    ?Sodium 135 - 145 mmol/L 136   137   135    ?Potassium 3.5 - 5.1 mmol/L 3.2   3.2   3.3    ?Chloride 98 - 111 mmol/L 96   95   93    ?CO2 22 - 32 mmol/L '30   31   28    '$ ?Calcium 8.9 - 10.3 mg/dL 9.2   9.1   9.5    ?  ?  ? ?LOS: ?5 days ?A FACE TO FACE EVALUATION WAS PERFORMED ? ?Meredith Staggers ?06/20/2021, 10:08 AM  ? ?  ?

## 2021-06-20 NOTE — Progress Notes (Signed)
Speech Language Pathology Daily Session Note ? ?Patient Details  ?Name: Kimbely Whiteaker ?MRN: [id number] ?Date of Birth: [birth year] ? ?Today's Date: 06/20/2021 ?SLP Individual Time: 0903-1000 ?SLP Individual Time Calculation (min): 57 min ? ?Short Term Goals: ?Week 1: SLP Short Term Goal 1 (Week 1): Patient will consume current diet without overt s/s of aspiration with Mod I for use of swallowing compensatory strategies. ?SLP Short Term Goal 2 (Week 1): Patient will demonstrate functional problem solving for mildly complex tasks with supervision level verbal cues. ?SLP Short Term Goal 3 (Week 1): Patient will recall new, daily information with Mod A for use of compensatory strategies. ? ?Skilled Therapeutic Interventions:Skilled ST services focused on cognitive skills. Pt demonstrated recall of yesterday's PT session mod I. SLP facilitated complex problem solving and recall with current medication and TID pill organizer. Pt demonstrated mod I in functional and verbal problem solving of medication management task. However task was disturbed by frequency to use the bathroom for a BM and pt/pt's husband report this being an on going issue impacting participation in therapy services. SLP communicated with nurse and therapy team via secure chat on Epic.  Pt was left in room with family, call bell within reach and bed alarm set. SLP recommends to continue skilled services. ?   ? ?Pain ?Pain Assessment ?Pain Score: 0-No pain ? ?Therapy/Group: Individual Therapy ? ?Yoshiharu Brassell ?06/20/2021, 1:42 PM ?

## 2021-06-20 NOTE — Progress Notes (Signed)
Inpatient Rehabilitation Care Coordinator ?Assessment and Plan ?Patient Details  ?Name: Cassidy Tran ?MRN: [id number] ?Date of Birth: [birth year] ? ?Today's Date: 06/20/2021 ? ?Hospital Problems: Principal Problem: ?  Respiratory failure ([place]) ? ?Past Medical History:  ?Past Medical History:  ?Diagnosis Date  ? Acid reflux   ? Allergy   ? Anxiety   ? Arthritis   ? lower back  ? ASCUS (atypical squamous cells of undetermined significance) on Pap smear 03/2011  ? NEG HR HPV  ? Asthma   ? Cancer [facility])   ? skin cancer- age 46ish  ? Cervical dysplasia, mild 08/2010  ? LGSIL colposcopy biopsy showing koilocytotic atypia  ? Clotting disorder ([place])   ? Depression   ? Endometriosis   ? Headache(784.0)   ? High risk HPV infection 12/2011  ? Pap normal  ? Hypertension   ? Pneumonia   ? 2016ish  ? Seizures ([place])   ? seizure disorder  ? Smoker   ? Von Willebrand disease ([place])   ? ?Past Surgical History:  ?Past Surgical History:  ?Procedure Laterality Date  ? APPLICATION OF CRANIAL NAVIGATION N/A 11/24/2016  ? Procedure: APPLICATION OF CRANIAL NAVIGATION;  Surgeon: Kristeen Miss, MD;  Location: [place];  Service: Neurosurgery;  Laterality: N/A;  ? BRAIN SURGERY    ? BREAST BIOPSY Left   ? CHOLECYSTECTOMY N/A 05/05/2021  ? Procedure: CHOLECYSTECTOMY;  Surgeon: [name], MD;  Location: [place];  Service: General;  Laterality: N/A;  ? COLON RESECTION N/A 05/05/2021  ? Procedure: RIGHT HEMICOLECTOMY;  Surgeon: [name], MD;  Location: [place];  Service: General;  Laterality: N/A;  ? COLPOSCOPY    ? CYSTOSCOPY N/A 10/14/2012  ? Procedure: CYSTOSCOPY;  Surgeon: [name], MD;  Location: [facility];  Service: Gynecology;  Laterality: N/A;  ? DILATION AND CURETTAGE OF UTERUS    ? HYSTEROSCOPY WITH D & C  10/14/2010  ? Procedure: DILATATION AND CURETTAGE (D&C) /HYSTEROSCOPY;  Surgeon: [name], MD;  Location: [facility];  Service: Gynecology;  Laterality: N/A;  ? LAPAROSCOPIC HYSTERECTOMY N/A 10/14/2012  ? Procedure:  HYSTERECTOMY TOTAL LAPAROSCOPIC;  Surgeon: [name], MD;  Location: [facility];  Service: Gynecology;  Laterality: N/A;  CPT [phone] ? ?2 1/2 hours ? ?Dr. Uvaldo Rising to assist.  ? LAPAROSCOPY  04/27/2011  ? Procedure: LAPAROSCOPY OPERATIVE;  Surgeon: [name], MD;  Location: [facility];  Service: Gynecology;  Laterality: N/A;  removal right cyst , lysis of adhesions, biopsy of peritoneum  ? LAPAROTOMY N/A 05/05/2021  ? Procedure: EXPLORATORY LAPAROTOMY;  Surgeon: [name], MD;  Location: [place];  Service: General;  Laterality: N/A;  ? [place]  ? IN [place], [place]  ? PEG PLACEMENT N/A 05/30/2021  ? Procedure: PERCUTANEOUS ENDOSCOPIC GASTROSTOMY (PEG) PLACEMENT;  Surgeon: [name], MD;  Location: [place];  Service: General;  Laterality: N/A;  ? SUBOCCIPITAL CRANIECTOMY CERVICAL LAMINECTOMY N/A 11/06/2016  ? Procedure: Suboccipital decompression for chiari malformation;  Surgeon: Kristeen Miss, MD;  Location: [place];  Service: Neurosurgery;  Laterality: N/A;  ? TONSILLECTOMY    ? TRACHEOSTOMY TUBE PLACEMENT N/A 05/30/2021  ? Procedure: TRACHEOSTOMY;  Surgeon: [name], MD;  Location: [place];  Service: General;  Laterality: N/A;  ? VENTRICULOPERITONEAL SHUNT N/A 11/24/2016  ? Procedure: SHUNT INSERTION VENTRICULAR-PERITONEAL with Brainlab;  Surgeon: Kristeen Miss, MD;  Location: [place];  Service: Neurosurgery;  Laterality: N/A;  right side approach  ? WISDOM TOOTH EXTRACTION  1993  ? ?Social History:  reports that she has been smoking cigarettes. She has a 10.00 pack-year smoking history. She has never used smokeless tobacco. She reports that she does not currently use alcohol. She reports that she does not use drugs. ? ?Family / Support Systems ?Marital Status: Married ?How Long?: 12 years ?Patient Roles: Spouse ?Spouse/Significant Other: [name] (husband) ?Children: no children ?Other Supports: parents PRN ?Anticipated Caregiver: Husband ?Ability/Limitations of Caregiver:  intermittent support from husband due to work schedule ?Caregiver Availability: Intermittent ?Family Dynamics: Pt lives with her husband ? 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 ?Abuse/Neglect ?Abuse/Neglect Assessment Can Be Completed: Yes ?Physical Abuse: Denies ?Verbal Abuse: Denies ?Sexual Abuse: Denies ?Exploitation of patient/patient's resources: Denies ?Self-Neglect: Denies ? ?Patient response to: ?Social Isolation - How often do you feel lonely or isolated from those around you?: Never ? ?Emotional Status ?Pt's affect, behavior and adjustment status: Pt in good spirits at time of visit. ?Recent Psychosocial Issues: Denies ?Psychiatric History: Denies ?Substance Abuse History: Pt admits that she quit smoking  since time of admission (3/9).Has been smoking since 46 yrs old. Denies EtOH/rec drug use. ? 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 ? ?[employer] ?Community Agencies: None ?Premorbid Home Care/DME Agencies: None ?Transportation available at discharge: TBD ?Is the patient able to respond to transportation needs?: Yes ?In the past 12 months, has lack of transportation  kept you from medical appointments or from getting medications?: No ?In the past 12 months, has lack of transportation kept you from meetings, work, or from getting things needed for daily living?: No ?Resource referrals recommended: Neuropsychology ? ?Discharge Planning ?Living Arrangements: Spouse/significant other ?Support Systems: Spouse/significant other, Parent ?Type of Residence: Private residence ?Insurance Resources: Multimedia programmer (specify) ?Financial Resources: Employment, Family Support ?Financial Screen Referred: No ?Living Expenses: Own ?Money Management: Patient, Spouse ?Does the patient have any problems obtaining your medications?: No ?Home Management: Pt reports she and husband split meal prep, and husbnad does housecleaning. ?Patient/Family Preliminary Plans: TBD ?Care Coordinator Barriers to Discharge: Decreased caregiver support, Lack of/limited family support ?Care Coordinator Anticipated Follow Up Needs: HH/OP ?Expected length of stay: 10-12 days ? ?Clinical Impression ?SW met with pt and pt father in room to introduce self, explain role, and discuss discharge process. 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 POA- husband [name] [phone]). No DME. Pt aware SW will f/u with her husband.  ? ?1620-SW made contact with pt husband [name] to introduce self, explain role, and discuss discharge process. He is aware SW will f/u after team conference.  ? ?Rana Snare ?06/20/2021, 2:24 PM ? ?  ?

## 2021-06-21 LAB — BASIC METABOLIC PANEL
Anion gap: 10 (ref 5–15)
BUN: 5 mg/dL — ABNORMAL LOW (ref 6–20)
CO2: 29 mmol/L (ref 22–32)
Calcium: 9.6 mg/dL (ref 8.9–10.3)
Chloride: 98 mmol/L (ref 98–111)
Creatinine, Ser: 0.67 mg/dL (ref 0.44–1.00)
GFR, Estimated: >60 mL/min (ref >60)
Glucose, Bld: 109 mg/dL — ABNORMAL HIGH (ref 70–99)
Potassium: 3.7 mmol/L (ref 3.5–5.1)
Sodium: 137 mmol/L (ref 135–145)

## 2021-06-21 LAB — CBC
HCT: 30 % — ABNORMAL LOW (ref 36.0–46.0)
Hemoglobin: 9.6 g/dL — ABNORMAL LOW (ref 12.0–15.0)
MCH: 27.9 pg (ref 26.0–34.0)
MCHC: 32 g/dL (ref 30.0–36.0)
MCV: 87.2 fL (ref 80.0–100.0)
Platelets: 380 10*3/uL (ref 150–400)
RBC: 3.44 MIL/uL — ABNORMAL LOW (ref 3.87–5.11)
RDW: 14.5 % (ref 11.5–15.5)
WBC: 9.5 10*3/uL (ref 4.0–10.5)
nRBC: 0 % (ref 0.0–0.2)

## 2021-06-21 MED ORDER — CHOLESTYRAMINE 4 G PO PACK
4.0000 g | PACK | Freq: Four times a day (QID) | ORAL | Status: DC
  Administered 2021-06-21 (×3): 4 g via ORAL
  Filled 2021-06-21 (×17): qty 1

## 2021-06-21 MED ORDER — PSYLLIUM 95 % PO PACK
1.0000 | PACK | Freq: Two times a day (BID) | ORAL | Status: DC
  Administered 2021-06-21 (×2): 1 via ORAL
  Filled 2021-06-21 (×6): qty 1

## 2021-06-21 MED ORDER — DIPHENOXYLATE-ATROPINE 2.5-0.025 MG PO TABS
2.0000 | ORAL_TABLET | Freq: Four times a day (QID) | ORAL | Status: DC
  Administered 2021-06-21 – 2021-06-25 (×13): 2 via ORAL
  Filled 2021-06-21 (×14): qty 2

## 2021-06-21 NOTE — Progress Notes (Signed)
Physical Therapy Session Note ? ?Patient Details  ?Name: Cassidy Tran ?MRN: [id number] ?Date of Birth: January 12, 1976 ? ?Today's Date: 06/21/2021 ?PT Individual Time: 0321-2248 ?PT Individual Time Calculation (min): 53 min  ? ?Short Term Goals: ?Week 1:  PT Short Term Goal 1 (Week 1): Patient will perform basic transfers with CGA consistently using LRAD. ?PT Short Term Goal 2 (Week 1): Patient will ambulate >50 feet using LRAD maintaining O2 saturations >90% with <6L/min O2. ?PT Short Term Goal 3 (Week 1): Patient will initiate stair training. ? ?Skilled Therapeutic Interventions/Progress Updates:  ? Received pt supine in bed with husband present at bedside. Pt agreeable to PT treatment and reported "irritation" around PEG tube site. Pt reports that she continues to be limited by frequent diarrhea episodes but motivated to participate in therapy session. Session with emphasis on functional mobility/transfers, generalized strengthening and endurance, dynamic standing balance/coordination, gait training, and stair navigation. Pt on 2L O2 via [place] with O2 sats 99% at rest. Pt performed bed mobility with supervision/mod I and transferred bed<>WC stand<>pivot without AD and CGA. Pt transported to/from room in [facility] dependently for time management and energy conservation purposes. Pt navigated 8 3in steps with wall for support with CGA overall to simulate home entry - ascending and descending with a step to pattern. O2 sats dropped to 86% but quickly recovered to 96% with seated rest break. Sit<>stand with RW and supervision and pt ambulated 144f with RW and supervision with total A to manage O2 tank; no LOB noted - O2 sats dropped to 86% but quickly increased to 96% within 1-2 minute seated rest. Pt reported fatigue after ambulating and "ready for a nap". Discussed benefits of using RW for energy conservation, to reduce anxiety, and for stability to allow pt to be as independent as possible at home - pt in agreement and  notified primary PT. Returned to room and [facility] and [facility]. Concluded [facility] with [facility], needs within reach, and family present at bedside. Pt's husband cleared to assist with transfers/toileting by OT and pt left on 2L with O2 sats 97%.  ? ?Therapy Documentation ?Precautions:  ?Precautions ?Precautions: Fall ?Precaution Comments: (P) trach collar- capped, watch HR & O2 ?Restrictions ?Weight Bearing Restrictions: No ? ?Therapy/Group: Individual Therapy ?ABlenda Nicely?ABecky SaxPT, DPT  ?06/21/2021, 8:56 AM  ?

## 2021-06-21 NOTE — Progress Notes (Signed)
Patient ID: Cassidy Tran, female   DOB: 05-16-1975, 46 y.o.   MRN: [id number] ? ?SW met with pt and pt parents to provide updates from team conference, and d/c date 4/29. Reports her husband stays over night and can be here for family edu on Thursday 8am-12pm. SW discussed with pt possibly going home with oxygen. SW reiterated will confirm d/c recs once aware. Pt aware SW will follow-up with her husband.  ? ?65- SW spoke with pt husband [name] to discuss above. Confirms he can be here for family edu on Thursday 8am-12pm. Confirms he will be primary caregiver, and will speak with his wife about which home she will stay at close to d/c ([place] or [place]). ? ?Loralee Pacas, MSW, LCSWA ?Office: [phone] ?Cell: [phone] ?Fax: [phone]  ?

## 2021-06-21 NOTE — Progress Notes (Signed)
Occupational Therapy Session Note ? ?Patient Details  ?Name: Cassidy Tran ?MRN: [id number] ?Date of Birth: April 01, 1975 ? ?Today's Date: 06/21/2021 ?OT Individual Time: 1771-1657 ?OT Individual Time Calculation (min): 57 min  ? ?Today's Date: 06/21/2021 ?OT Individual Time: 1130-1200 ?OT Individual Time Calculation (min): 30 min  ? ?Short Term Goals: ?Week 1:  OT Short Term Goal 1 (Week 1): Pt will transfer to toilet with supervision and O2 remain >90% on supp O2 to dmeo improved hemodynamic stability ?OT Short Term Goal 2 (Week 1): Pt will complete 2/3 steps of toileting ?OT Short Term Goal 3 (Week 1): Pt will bathe at shower level with no more than min A ?OT Short Term Goal 4 (Week 1): Pt will need no more than min cuing for breathing strategies during funcitonal transfers to improve recall during transitional movements ? ?Skilled Therapeutic Interventions/Progress Updates:  ?  Session 1: ? ?Pt received in bed with husband present and no pain but overall "discomfort and burning with urination." Pt also frequently voiding small amounts of urine 2x during session. Pt concerned for UTI. Alerted RN. Pt also received valium right before session and lethargic throughout/ Pt participates fully to best of abilities with encouragement. ? ?ADL: ?Pt completes toilet transfer with OT to demo amb with RW to/from bathroom for husband. Pt with no overt LOB but benefits from direction on which way to turn to decrease backing up (more likely to lose balance). Pt husband educated on supplemental O2- checking tank prior to mobilizing and if lower than 25% to call and ask for new tank and use BSC at bedside. Pt completes amb transfer and husband able to teach back proper CGA and amb transfer with pt.  ? ?Pt completes amb transfer with RW to walk in shower with TTB as this is the DME likely to use at home. Pt completes with CGA overall with cuing for sequence.  ? ?Pt left at end of session in bed with exit alarm on, call light in reach  and all needs met ? ?Session 2: ? ?Pt received in bed with no pain reported.  ?ADL: ?Pt completes changing clothing in long sitting and sitting EOB on 2L with set up A overall and 1 VC for hand placement. Pt O2 at 94%. Bumped down to 1L for rest of session ? ?Therapeutic activity ?Functional mobility 10 feet with RW to/from car with O2 dropping to 83% and requires 1 min pursed lip breathing >90%. Pt completes mobility up/down ramp with RW and supervision with similar O2 response as stated before. Pt remains on 1.5L at rest as stated below.  ? ?Pt left at end of session in bed with O2 on 1.5L satting at 94% at rest ? ?Therapy Documentation ?Precautions:  ?Precautions ?Precautions: Fall ?Precaution Comments: (P) trach collar- capped, watch HR & O2 ?Restrictions ?Weight Bearing Restrictions: No ?General: ?  ? ?Therapy/Group: Individual Therapy ? ?[name] ?06/21/2021, 6:50 AM ?

## 2021-06-21 NOTE — Progress Notes (Signed)
?                                                       PROGRESS NOTE ? ? ?Subjective/Complaints: ? ?Stools remain loose, frequent. She feels that when she eats or drinks something, it immediately "comes out the other end."  Still no fever, chills, odor, abdominal cramping. Belly still sore from PEG however.  ? ?ROS: Patient denies fever, rash, sore throat, blurred vision, dizziness,   cough, shortness of breath or chest pain, joint or back/neck pain, headache, or mood change.  ? ? ?Objective: ?  ?No results found. ?Recent Labs  ?  06/21/21 ?4098  ?WBC 9.5  ?HGB 9.6*  ?HCT 30.0*  ?PLT 380  ? ? ?Recent Labs  ?  06/20/21 ?1191 06/21/21 ?4782  ?NA 136 137  ?K 3.2* 3.7  ?CL 96* 98  ?CO2 30 29  ?GLUCOSE 108* 109*  ?BUN 5* 5*  ?CREATININE 0.59 0.67  ?CALCIUM 9.2 9.6  ? ? ?Intake/Output Summary (Last 24 hours) at 06/21/2021 1216 ?Last data filed at 06/20/2021 1900 ?Gross per 24 hour  ?Intake 120 ml  ?Output --  ?Net 120 ml  ?  ? ?  ? ?Physical Exam: ?Vital Signs ?Blood pressure (!) 122/46, pulse 77, temperature 98.2 ?F (36.8 ?C), temperature source Oral, resp. rate 16, height '5\' 3"'$  (1.6 m), weight 84.2 kg, last menstrual period 09/16/2012, SpO2 100 %. ? ?Constitutional: No distress . Vital signs reviewed. ?HEENT: NCAT, EOMI, oral membranes moist ?Neck: supple ?Cardiovascular: RRR without murmur. No JVD    ?Respiratory/Chest: CTA Bilaterally without wheezes or rales. Normal effort    ?GI/Abdomen: BS +, still tender around PEG, PEG site with some drainage but otherwise looks ok. ?Ext: no clubbing, cyanosis, or edema ?Psych: a little more up beat today ?Musculoskeletal: FROM, Rt upper arm PICC removed on 4/21 ?Neurological: Alert and oriented x 4. 3/5 strength throughout. ?Skin: Warm and dry. ?Ext: no clubbing, cyanosis, or edema ?  ? ? ?Assessment/Plan: ?1. Functional deficits which require 3+ hours per day of interdisciplinary therapy in a comprehensive inpatient rehab setting. ?Physiatrist is providing close team  supervision and 24 hour management of active medical problems listed below. ?Physiatrist and rehab team continue to assess barriers to discharge/monitor patient progress toward functional and medical goals ? ?Care Tool: ? ?Bathing ? Bathing activity did not occur: Refused ?Body parts bathed by patient: Abdomen, Chest, Left arm, Right upper leg, Left upper leg, Face  ? Body parts bathed by helper: Front perineal area, Buttocks ?  ?  ?Bathing assist Assist Level: Minimal Assistance - Patient > 75% ?  ?  ?Upper Body Dressing/Undressing ?Upper body dressing   ?What is the patient wearing?: [place] only ?   ?Upper body assist Assist Level: Total Assistance - Patient < 25% ?   ?Lower Body Dressing/Undressing ?Lower body dressing ? ? ? Lower body dressing activity did not occur: Refused ?What is the patient wearing?: Pants ? ?  ? ?Lower body assist Assist for lower body dressing: Minimal Assistance - Patient > 75% ?   ? ?Toileting ?Toileting    ?Toileting assist Assist for toileting: Minimal Assistance - Patient > 75% ?  ?  ?Transfers ?Chair/bed transfer ? ?Transfers assist ?   ? ?Chair/bed transfer assist level: Contact Guard/Touching assist ?  ?  ?Locomotion ?Ambulation ? ? ?  Ambulation assist ? ?   ? ?Assist level: Supervision/Verbal cueing ?Assistive device: Walker-rolling ?Max distance: 165f  ? ?Walk 10 feet activity ? ? ?Assist ?   ? ?Assist level: Supervision/Verbal cueing ?Assistive device: Walker-rolling  ? ?Walk 50 feet activity ? ? ?Assist Walk 50 feet with 2 turns activity did not occur: Safety/medical concerns ? ?Assist level: Supervision/Verbal cueing ?Assistive device: Walker-rolling  ? ? ?Walk 150 feet activity ? ? ?Assist Walk 150 feet activity did not occur: Safety/medical concerns ? ?Assist level: Supervision/Verbal cueing ?Assistive device: Walker-rolling ?  ? ?Walk 10 feet on uneven surface  ?activity ? ? ?Assist Walk 10 feet on uneven surfaces activity did not occur: Safety/medical  concerns ? ? ?  ?   ? ?Wheelchair ? ? ? ? ?Assist Is the patient using a wheelchair?: Yes ?Type of Wheelchair: Manual ?  ? ?Wheelchair assist level: Dependent - Patient 0% ?   ? ? ?Wheelchair 50 feet with 2 turns activity ? ? ? ?Assist ? ?  ?  ? ? ?Assist Level: Dependent - Patient 0%  ? ?Wheelchair 150 feet activity  ? ? ? ?Assist ?   ? ? ?Assist Level: Dependent - Patient 0%  ? ?Blood pressure (!) 122/46, pulse 77, temperature 98.2 ?F (36.8 ?C), temperature source Oral, resp. rate 16, height '5\' 3"'$  (1.6 m), weight 84.2 kg, last menstrual period 09/16/2012, SpO2 100 %. ? ?Medical Problem List and Plan: ?1. Functional deficits secondary to operative complications including respiratory failure status post right hemicolectomy for neuroendocrine tumor. ?            -patient may shower ?            -ELOS/Goals: tentative dc date of 4/29        ?            -Continue CIR therapies including PT, OT  ?2.  Antithrombotics: ?-DVT/anticoagulation:  Pharmaceutical: Lovenox ?            -antiplatelet therapy: None ?3. Abdominal pain with IV medication administration: Tylenol, oxycodone as needed.   ?-4/24 Continue to utilize oxycodone 5-10 mg q4 hrs as needed for abdominal pain ?4. Mood: LCSW to evaluate and provide emotional support ?            -- On scheduled Valium 4 mg 3 times daily for anxiety ?            -antipsychotic agents: n/a ?5. Neuropsych: This patient is capable of making decisions on her own behalf. ?6. Skin/Wound Care: Routine skin care checks ?7. Fluids/Electrolytes/Nutrition: Routine I's and O's and follow-up chemistries ?            -- Regular diet/thin liquids 4/17 ?            - ?8: Tracheostomy 4/3>>now in 4CL, capped 4/18 ?            -trach out, dressing in place.  ?9: Volume overload:   ?Filed Weights  ? 06/18/21 0643 06/19/21 0508 06/20/21 0500  ?Weight: 83.8 kg 84.1 kg 84.2 kg  ?  -weights down from admission ? -does not appear edematous ? -lasix exacerbating hypokalemia---holding lasix for now ?11:  ABLA: H and H low but stable; continue to monitor, follow-up CBC 4/25 ?12: Epilepsy>>no new seizure activity ?            --post-op encephalopathy likely due to Depakote, now on Keppra ?            --neuro rec: switching  to zonasimide 100 mg daily ?13: Chiari malformation s/p V-P shunt 2018 ?            --ID work-up this admission; negative L-P ?14: Von Willebrand disease: stable perioperative course ?15: Neuroendocrine tumor s/p right hemicolectomy: follow-up with Dr. Marin Olp ?16: Biliary dyskinesia: s/p cholecystectomy with severe diarrhea ?            -continued diarrhea, does not have characteristics of c diff per pt/rn ? -resumed questran, continue florastor, imodium ? -held prosource via tube ? -encouraging po ? -increased questran to qid, added metamucil today 4/25 ? -will reach out to surgery for recs ?17. Tachycardia: increase coreg 3.'125mg'$  to BID  ?-4/22 Improved with HR in the 90's, continue Coreg 3.125 mg BID ?-4/24 HR stable in the 90's on current regimen Coreg 3.125 mg BID ? ? ?  06/21/2021  ?  6:23 AM 06/20/2021  ?  7:51 PM 06/20/2021  ?  4:00 PM  ?Vitals with BMI  ?Systolic 892 119 417  ?Diastolic 46 85 70  ?Pulse 77 92 95  ?  ?18. Decreased appetite: encourage po  ?19. Hypokalemia: changed potassium to oral but she could not tolerate this- discontinued potassium as we are decreasing Lasix dose by half, repeat on Monday.  ?-4/25-potassium improved ? -continue klor-con--reduce to 55mq bid ? -lasix stopped ? ?  Latest Ref Rng & Units 06/21/2021  ?  5:36 AM 06/20/2021  ?  5:17 AM 06/19/2021  ? 10:36 AM  ?BMP  ?Glucose 70 - 99 mg/dL 109   108   122    ?BUN 6 - 20 mg/dL '5   5   7    '$ ?Creatinine 0.44 - 1.00 mg/dL 0.67   0.59   0.63    ?Sodium 135 - 145 mmol/L 137   136   137    ?Potassium 3.5 - 5.1 mmol/L 3.7   3.2   3.2    ?Chloride 98 - 111 mmol/L 98   96   95    ?CO2 22 - 32 mmol/L '29   30   31    '$ ?Calcium 8.9 - 10.3 mg/dL 9.6   9.2   9.1    ?  ?  ? ?LOS: ?6 days ?A FACE TO FACE EVALUATION WAS  PERFORMED ? ?ZMeredith Staggers?06/21/2021, 12:16 PM  ? ?  ?

## 2021-06-21 NOTE — Progress Notes (Signed)
Speech Language Pathology Daily Session Note ? ?Patient Details  ?Name: Cassidy Tran ?MRN: [id number] ?Date of Birth: [birth year] ? ?Today's Date: 06/21/2021 ?SLP Individual Time: 1002-1100 ?SLP Individual Time Calculation (min): 58 min ? ?Short Term Goals: ?Week 1: SLP Short Term Goal 1 (Week 1): Patient will consume current diet without overt s/s of aspiration with Mod I for use of swallowing compensatory strategies. ?SLP Short Term Goal 2 (Week 1): Patient will demonstrate functional problem solving for mildly complex tasks with supervision level verbal cues. ?SLP Short Term Goal 3 (Week 1): Patient will recall new, daily information with Mod A for use of compensatory strategies. ? ?Skilled Therapeutic Interventions: ?Pt seen for skilled ST with focus on cognitive goals, pt upright in wheelchair with parents present throughout. Pt reports continued limitations with diarrhea but agreeable to therapeutic tasks, 2L O2 via [place] for entirety of session. Pt mod I to utilize memory notebook to recall events of morning and previous therapy sessions. SLP and pt reviewing med list which patient states MD has made some changes too since day it was made, still requires verbal cues to recall home meds with accuracy. Pt completing simple money counting task with 100% accuracy, "Solving Daily Math Problems" subtest from [employer] with 90% accuracy. SLP facilitating divided attention task by providing overall min A cues, pt demonstrating cognitive fatigue toward end of exercise. Pt left in wheelchair with parents present for needs, cont ST POC. ? ?Pain ?Pain Assessment ?Pain Scale: 0-10 ?Pain Score: 0-No pain ? ?Therapy/Group: Individual Therapy ? ?Dewaine Conger ?06/21/2021, 11:00 AM ?

## 2021-06-21 NOTE — Patient Care Conference (Signed)
Inpatient RehabilitationTeam Conference and Plan of Care Update ?Date: 06/21/2021   Time: 10:19 AM  ? ? ?Patient Name: Cassidy Tran      ?Medical Record Number: [id number]  ?Date of Birth: [birth year] ?Sex: Female         ?Room/Bed: 5C19C/5C19C-01 ?Payor Info: Payor: [employer] / Plan: [place] PREFERRED / Product Type: *No Product type* /   ? ?Admit Date/Time:  06/15/2021 12:57 PM ? ?Primary Diagnosis:  Respiratory failure ([place]) ? ?Hospital Problems: Principal Problem: ?  Respiratory failure ([place]) ? ? ? ?Expected Discharge Date: Expected Discharge Date: 06/25/21 ? ?Team Members Present: ?Physician leading conference: Dr. Alger Simons ?Social Worker Present: Loralee Pacas, LCSWA ?Nurse Present: Dorthula Nettles, RN ?PT Present: Apolinar Junes, PT ?OT Present: Mariane Masters, OT ?SLP Present: Weston Anna, SLP ?PPS Coordinator present : Gunnar Fusi, SLP ? ?   Current Status/Progress Goal Weekly Team Focus  ?Bowel/Bladder ? ? continent b/b, lbm 4/24  remain continent  toilet as needed   ?Swallow/Nutrition/ Hydration ? ? Supervision A, regular textures and thin liquids  Mod I  tolerance and education   ?ADL's ? ? CGA with severe deconditioning/new oxygen  set up  endurance, balance, functional transfers, ADL/IADL retraining, family education   ?Mobility ? ? CGA-supervision overall, gait >100 ft on 3L/min O2  Supervision overall, 2 steps no rails CGA  Activity tolerance, balance, functional mobility, gait and stair training, d/c planning, patient/caregiver education   ?Communication ? ?           ?Safety/Cognition/ Behavioral Observations ? Supervision-Mod I  Mod I  complex problem solving, recall with use of compensatory strategies   ?Pain ? ? no reports of pain  remain pain free  assess pain q 4hr and prn   ?Skin ? ? abdominal and neck insicisions  no new breakdown  assess skin q shift and prn   ? ? ?Discharge Planning:  ?Pt to d/c to home with husband and will need to be intermittent level of care due to  him working. Pt will also have support from her parents.   ?Team Discussion: ?Still having loose stools. Medications adjusted. Nocturnal tube feeds discontinued, also discontinued the [employer]. May need a GI consult. Continent B/B, 8/10 reported pain at PEG site. Incisions healing appropriately. PEG site needs daily dressing change. Discharging home with husband and father to assist. Mom is able to provide supervision.  ? ?Patient on target to meet rehab goals: ?yes, set-up to supervision goals, mod I speech goals. CGA to supervision transfers and ADL's. Working on endurance and weaning O2. Supervision with RW, walked 180 ft today. Memory and cognition improving.  ? ?*See Care Plan and progress notes for long and short-term goals.  ? ?Revisions to Treatment Plan:  ?Adjusting medications, may order a GI consult. ?  ?Teaching Needs: ?Family education, medication/pain management, skin/wound care, transfer/gait training, etc. ?  ?Current Barriers to Discharge: ?Decreased caregiver support, Home enviroment access/layout, Wound care, Insurance for SNF coverage, Weight, and New oxygen ? ?Possible Resolutions to Barriers: ?Family education ?Follow-up therapy ?Order recommended DME ?Wean off oxygen ?  ? ? Medical Summary ?Current Status: debility related to hemicolectomy/associated medical. severe diarrhea. on po now. ? Barriers to Discharge: Medical stability ?  ?Possible Resolutions to [employer]: slowing down diarrhea, maximizing meds/diet to improve this. consider gi consult ? ? ?Continued Need for Acute Rehabilitation Level of Care: The patient requires daily medical management by a physician with specialized training in physical medicine and rehabilitation for the following reasons: ?Direction  of a multidisciplinary physical rehabilitation program to maximize functional independence : Yes ?Medical management of patient stability for increased activity during participation in an intensive rehabilitation  regime.: Yes ?Analysis of laboratory values and/or radiology reports with any subsequent need for medication adjustment and/or medical intervention. : Yes ? ? ?I attest that I was present, lead the team conference, and concur with the assessment and plan of the team. ? ? ?Dorthula Nettles G ?06/21/2021, 1:25 PM  ? ? ? ? ? ? ?

## 2021-06-22 ENCOUNTER — Inpatient Hospital Stay (HOSPITAL_COMMUNITY): Payer: No Typology Code available for payment source

## 2021-06-22 LAB — CBC
HCT: 28.9 % — ABNORMAL LOW (ref 36.0–46.0)
Hemoglobin: 8.9 g/dL — ABNORMAL LOW (ref 12.0–15.0)
MCH: 26.9 pg (ref 26.0–34.0)
MCHC: 30.8 g/dL (ref 30.0–36.0)
MCV: 87.3 fL (ref 80.0–100.0)
Platelets: 356 10*3/uL (ref 150–400)
RBC: 3.31 MIL/uL — ABNORMAL LOW (ref 3.87–5.11)
RDW: 14.6 % (ref 11.5–15.5)
WBC: 8.6 10*3/uL (ref 4.0–10.5)
nRBC: 0 % (ref 0.0–0.2)

## 2021-06-22 LAB — BASIC METABOLIC PANEL
Anion gap: 9 (ref 5–15)
BUN: 5 mg/dL — ABNORMAL LOW (ref 6–20)
CO2: 28 mmol/L (ref 22–32)
Calcium: 9.1 mg/dL (ref 8.9–10.3)
Chloride: 99 mmol/L (ref 98–111)
Creatinine, Ser: 0.62 mg/dL (ref 0.44–1.00)
GFR, Estimated: >60 mL/min (ref >60)
Glucose, Bld: 103 mg/dL — ABNORMAL HIGH (ref 70–99)
Potassium: 3.4 mmol/L — ABNORMAL LOW (ref 3.5–5.1)
Sodium: 136 mmol/L (ref 135–145)

## 2021-06-22 MED ORDER — DIAZEPAM 2 MG PO TABS
2.0000 mg | ORAL_TABLET | Freq: Two times a day (BID) | ORAL | Status: DC | PRN
  Administered 2021-06-22 – 2021-06-24 (×3): 2 mg via ORAL
  Filled 2021-06-22 (×3): qty 1

## 2021-06-22 NOTE — Progress Notes (Addendum)
Alerted by PT this morning of patient complaining of right foot pain. She states she fell while being assisted OOB while on acute care and her foot has been hurting since. She has tenderness to palpation to right lateral foot.  X-rays obtained: ? ?EXAM: ?RIGHT ANKLE - COMPLETE 3+ VIEW; RIGHT FOOT COMPLETE - 3+ VIEW ?  ?COMPARISON:  None. ?  ?FINDINGS: ?Right ankle: ?  ?Mild distal medial malleolar degenerative spurring. The ankle ?mortise is symmetric and intact. ?  ?Right foot: ?  ?There is a subtle transverse lucency at the base of fifth metatarsal ?suggesting an acute to subacute nondisplaced fracture. ?  ?Mild great toe metatarsophalangeal joint and second through fifth ?digit interphalangeal joint space narrowing degenerative change. No ?dislocation. ?  ?IMPRESSION: ?Nondisplaced acute to subacute fracture of the base of fifth ?metatarsal. ?  ?  ?Electronically Signed ?  By: Yvonne Kendall M.D. ?  On: 06/22/2021 12:11 ? ? ? ? ?Contacted Hilbert Odor PA-C on call for orthopedic surgery. He recommends WBAT in post-op shoe and to follow-up with Dr. Mable Fill in 3 weeks. ?

## 2021-06-22 NOTE — Progress Notes (Signed)
Speech Language Pathology Daily Session Note ? ?Patient Details  ?Name: Cassidy Tran ?MRN: [id number] ?Date of Birth: 1975-09-22 ? ?Today's Date: 06/22/2021 ?SLP Individual Time: 4098-1191 ?SLP Individual Time Calculation (min): 44 min ? ?Short Term Goals: ?Week 1: SLP Short Term Goal 1 (Week 1): Patient will consume current diet without overt s/s of aspiration with Mod I for use of swallowing compensatory strategies. ?SLP Short Term Goal 2 (Week 1): Patient will demonstrate functional problem solving for mildly complex tasks with supervision level verbal cues. ?SLP Short Term Goal 3 (Week 1): Patient will recall new, daily information with Mod A for use of compensatory strategies. ? ?Skilled Therapeutic Interventions:Skilled ST services focused on cognitive skills. Pt demonstrated mod I use of memory notebook to recall yesterday's ST session. SLP facilitated complex problem solving in x3 scheduling task demonstrating mod I with extra time. Pt expressed cognitive skills appear near baseline. Pt was left in room with call bell within reach and bed alarm set. SLP recommends to continue skilled services. ?   ? ?Pain ?Pain Assessment ?Pain Scale: 0-10 ?Pain Score: 4  ?Pain Type: Acute pain ?Pain Location: Abdomen ?Pain Orientation: Upper ?Pain Descriptors / Indicators: Discomfort ?Pain Frequency: Intermittent ?Pain Onset: On-going ?Patients Stated Pain Goal: 2 ?Pain Intervention(s): Refused ? ?Therapy/Group: Individual Therapy ? ?Jason Hauge ?06/22/2021, 1:46 PM ?

## 2021-06-22 NOTE — Discharge Instructions (Addendum)
Inpatient Rehab Discharge Instructions ? ?Cher Nakai ? ?Discharge date and time: 06/25/2021 ? ?Activities/Precautions/ Functional Status: ?Activity: no lifting, driving, or strenuous exercise for until cleared by MD ?Diet: regular diet ?Wound Care: keep wound clean and dry ? ?Functional status:  ?___ No restrictions     ___ Walk up steps independently ?___ 24/7 supervision/assistance   ___ Walk up steps with assistance ?___ Intermittent supervision/assistance  ___ Bathe/dress independently ?___ Walk with walker     ___ Bathe/dress with assistance ?___ Walk Independently    ___ Shower independently ?___ Walk with assistance    __x_ Shower with assistance ?_x__ No alcohol     ___ Return to work/school ________ ? ? ?COMMUNITY REFERRALS UPON DISCHARGE:   ? ? ?Outpatient: PT         ?            Agency: [facility] Phone: [phone]  ?            Appointment Date/Time: *Please expect follow-up within 7-10 business days to schedule your appointment. If you have not received follow-up, be sure to contact site directly.*  ? ?Medical Equipment/Items Ordered: rolling walker, tub transfer bench, and home oxygen ?                                                Agency/Supplier: [facility] [phone] ? ? ? ?Special Instructions: ? ?No driving, alcohol consumption or tobacco use.  ? ?My questions have been answered and I understand these instructions. I will adhere to these goals and the provided educational materials after my discharge from the hospital. ? ?Patient/Caregiver Signature _______________________________ Date __________ ? ?Clinician Signature _______________________________________ Date __________ ? ?Please bring this form and your medication list with you to all your follow-up doctor's appointments.   ?

## 2021-06-22 NOTE — Progress Notes (Signed)
Orthopedic Tech Progress Note ?Patient Details:  ?Cassidy Tran ?[birth year] ?[id number] ? ?Ortho Devices ?Type of Ortho Device: Postop shoe/boot ?Ortho Device/Splint Location: RLE ?Ortho Device/Splint Interventions: Ordered ?  ?Post Interventions ?Patient Tolerated: Well ?Instructions Provided: Care of device ? ?Janit Pagan ?06/22/2021, 6:44 PM ? ?

## 2021-06-22 NOTE — Progress Notes (Signed)
Occupational Therapy Session Note ? ?Patient Details  ?Name: Cassidy Tran ?MRN: [id number] ?Date of Birth: June 12, 1975 ? ?Today's Date: 06/22/2021 ?OT Individual Time: 0900-1008 ?OT Individual Time Calculation (min): 68 min  ? ?Today's Date: 06/22/2021 ?OT Individual Time: 7001-7494 ?OT Individual Time Calculation (min): 30 min  ? ?Short Term Goals: ?Week 1:  OT Short Term Goal 1 (Week 1): Pt will transfer to toilet with supervision and O2 remain >90% on supp O2 to dmeo improved hemodynamic stability ?OT Short Term Goal 2 (Week 1): Pt will complete 2/3 steps of toileting ?OT Short Term Goal 3 (Week 1): Pt will bathe at shower level with no more than min A ?OT Short Term Goal 4 (Week 1): Pt will need no more than min cuing for breathing strategies during funcitonal transfers to improve recall during transitional movements ? ?Skilled Therapeutic Interventions/Progress Updates:  ?  Session 1:  ?Pt received in bed with unrated stomach/PEG +foot pain  ?ADL: ?Pt electing to change clothing at second session and reporting issues with increased PEG site pain and increased L foot pain. Pt completes functional mobility and transfers on 1-2L of OT depending on distance. In ADL apartment pt completes TTB, couch an recliner transfers. All on 1L for ~15-20 feet mobility dropping to 84% requiring 45sec to 1 min to rebound to >90% with cuing for pursed lip breathing ? ?Pt completes kitchen search into various height cabinets/appliaces in prep for IADL retraining from ambualtory level with min cuing for safety/positioning throughout environment. Pt uses walker tray AE to improve reach/safety and transportation of items with education on energy conservation techniques throughout activity with edu re purchasing info for home if interested to increase safety with IADLs.  ? ? ?Pt left at end of session in bed with exit alarm on, call light in reach and all needs met ? ?Session 2: ?Pt received in bed with unrated foot pain- Xray pending   ? ?ADL: ?Pt completes ADL at overall set up for dressing in long sitting and EOB Level. O2 on 1.5L and remains >93% after dressing.  ? ?Therapeutic activity ?Functional mobility in the hallway with RW 100' with standing break half way desat to 88% on 2L. Rebounding pt in standing with CGA and 1 min of standing greater than 92%. ? ?Pt left at end of session in bed with exit alarm on, call light in reach and all needs met ? ? ?Therapy Documentation ?Precautions:  ?Precautions ?Precautions: Fall ?Precaution Comments: (P) trach collar- capped, watch HR & O2 ?Restrictions ?Weight Bearing Restrictions: No ? ? ? ?Therapy/Group: Individual Therapy ? ?[name] ?06/22/2021, 6:50 AM ?

## 2021-06-22 NOTE — Progress Notes (Addendum)
Physical Therapy Session Note ? ?Patient Details  ?Name: Cassidy Tran ?MRN: [id number] ?Date of Birth: 01/14/1976 ? ?Today's Date: 06/22/2021 ?PT Individual Time: 0800-0900 ?PT Individual Time Calculation (min): 60 min  ? ?Short Term Goals: ?Week 1:  PT Short Term Goal 1 (Week 1): Patient will perform basic transfers with CGA consistently using LRAD. ?PT Short Term Goal 2 (Week 1): Patient will ambulate >50 feet using LRAD maintaining O2 saturations >90% with <6L/min O2. ?PT Short Term Goal 3 (Week 1): Patient will initiate stair training. ? ?Skilled Therapeutic Interventions/Progress Updates:  ?   ?Patient in bed receiving breathing treatment from respiratory with her husband at bedside upon PT arrival. Patient alert and agreeable to PT session. Patient reported 8/10 PEG site pain during session, RN made aware. PT provided repositioning, rest breaks, and distraction as pain interventions throughout session.  ? ?While patient completed her breathing treatments, discussed d/c planning, and PT goals, safe guarding and assist levels with all mobility. Patient's husband reports they will be going to their home with a ramped entrance and that he would be taking time off work to be with her as her primary caregiver at d/c. He reports that he has experience managing O2 tanks and lines due to his mother being on oxygen. Educated on walking assessment to be performed tomorrow to determine O2 needs at d/c, patient and her husband stated understanding. ? ?Patient was on 1.5 L/min O2 at beginning of session. Increased to 2 L/min during gait training, SPO2 87%, HR 113 at end, recovered to 96%, HR 97 <1 min of pursed lip breathing. Returned to 1.5 L/min at end of session, SPO2 98% ? ?Therapeutic Activity: ?Bed Mobility: Patient performed supine to sit with mod I with HOB slightly elevated (wedge pillow at home).  ?Transfers: Patient performed sit to/from stand x2 with supervision using the RW. Provided verbal cues for hand  placement and reaching back to sit. Patient performed an ambulatory transfer to the sink to perform oral care in standing with supervision and min cues for O2 line management, stood 3 min before completing task and requiring a seated rest break.  ? ?Gait Training:  ?Patient ambulated >150 feet using RW with CGA and w/c follow due to decreased activity tolerance. Ambulated with decreased gait speed, decreased step length and height, absent DF on R with increased knee/hip flexion to compensate, forward trunk lean, and downward head gaze. Provided verbal cues for erect posture, safe proximity to RW, and paced breathing throughout. ? ?Patient in w/c with her husband in the room at end of session with breaks locked, alarm not set as her husband is cleared for in room mobility, and all needs within reach.  ? ?Therapy Documentation ?Precautions:  ?Precautions ?Precautions: Fall ?Precaution Comments: (P) trach collar- capped, watch HR & O2 ?Restrictions ?Weight Bearing Restrictions: No ? ? ? ?Therapy/Group: Individual Therapy ? ?Doreene Burke PT, DPT ? ?06/22/2021, 12:04 PM  ?

## 2021-06-22 NOTE — Progress Notes (Signed)
?                                                       PROGRESS NOTE ? ? ?Subjective/Complaints: ? ?Resting when I came in. Reports stools are slowing down. Last recorded bm at 1500 yesterday (liquid) ? ?ROS: Patient denies fever, rash, sore throat, blurred vision, dizziness, nausea, vomiting, cough, chest pain, joint or back/neck pain, headache, or mood change.  ? ? ?Objective: ?  ?No results found. ?Recent Labs  ?  06/21/21 ?0102 06/22/21 ?7253  ?WBC 9.5 8.6  ?HGB 9.6* 8.9*  ?HCT 30.0* 28.9*  ?PLT 380 356  ? ? ?Recent Labs  ?  06/21/21 ?6644 06/22/21 ?0347  ?NA 137 136  ?K 3.7 3.4*  ?CL 98 99  ?CO2 29 28  ?GLUCOSE 109* 103*  ?BUN 5* 5*  ?CREATININE 0.67 0.62  ?CALCIUM 9.6 9.1  ? ? ?Intake/Output Summary (Last 24 hours) at 06/22/2021 0843 ?Last data filed at 06/21/2021 1900 ?Gross per 24 hour  ?Intake 600 ml  ?Output --  ?Net 600 ml  ?  ? ?  ? ?Physical Exam: ?Vital Signs ?Blood pressure (!) 114/56, pulse 89, temperature 98.5 ?F (36.9 ?C), resp. rate 19, height '5\' 3"'$  (1.6 m), weight 85.1 kg, last menstrual period 09/16/2012, SpO2 96 %. ? ?Constitutional: No distress . Vital signs reviewed. ?HEENT: NCAT, EOMI, oral membranes moist ?Neck: supple ?Cardiovascular: RRR without murmur. No JVD    ?Respiratory/Chest: CTA Bilaterally without wheezes or rales. Normal effort    ?GI/Abdomen: BS +,tender around PEG. Non-distended ?Ext: no clubbing, cyanosis, or edema ?Psych: pleasant and cooperative  ?Musculoskeletal: FROM, Rt upper arm PICC removed on 4/21 ?Neurological: Alert and oriented x 4. 3/5 strength throughout. ?Skin: Warm and dry. ?Ext: no clubbing, cyanosis, or edema ?  ? ? ?Assessment/Plan: ?1. Functional deficits which require 3+ hours per day of interdisciplinary therapy in a comprehensive inpatient rehab setting. ?Physiatrist is providing close team supervision and 24 hour management of active medical problems listed below. ?Physiatrist and rehab team continue to assess barriers to discharge/monitor patient  progress toward functional and medical goals ? ?Care Tool: ? ?Bathing ? Bathing activity did not occur: Refused ?Body parts bathed by patient: Abdomen, Chest, Left arm, Right upper leg, Left upper leg, Face  ? Body parts bathed by helper: Front perineal area, Buttocks ?  ?  ?Bathing assist Assist Level: Minimal Assistance - Patient > 75% ?  ?  ?Upper Body Dressing/Undressing ?Upper body dressing   ?What is the patient wearing?: [place] only ?   ?Upper body assist Assist Level: Total Assistance - Patient < 25% ?   ?Lower Body Dressing/Undressing ?Lower body dressing ? ? ? Lower body dressing activity did not occur: Refused ?What is the patient wearing?: Pants ? ?  ? ?Lower body assist Assist for lower body dressing: Minimal Assistance - Patient > 75% ?   ? ?Toileting ?Toileting    ?Toileting assist Assist for toileting: Minimal Assistance - Patient > 75% ?  ?  ?Transfers ?Chair/bed transfer ? ?Transfers assist ?   ? ?Chair/bed transfer assist level: Contact Guard/Touching assist ?  ?  ?Locomotion ?Ambulation ? ? ?Ambulation assist ? ?   ? ?Assist level: Supervision/Verbal cueing ?Assistive device: Walker-rolling ?Max distance: 12f  ? ?Walk 10 feet activity ? ? ?Assist ?   ? ?  Assist level: Supervision/Verbal cueing ?Assistive device: Walker-rolling  ? ?Walk 50 feet activity ? ? ?Assist Walk 50 feet with 2 turns activity did not occur: Safety/medical concerns ? ?Assist level: Supervision/Verbal cueing ?Assistive device: Walker-rolling  ? ? ?Walk 150 feet activity ? ? ?Assist Walk 150 feet activity did not occur: Safety/medical concerns ? ?Assist level: Supervision/Verbal cueing ?Assistive device: Walker-rolling ?  ? ?Walk 10 feet on uneven surface  ?activity ? ? ?Assist Walk 10 feet on uneven surfaces activity did not occur: Safety/medical concerns ? ? ?  ?   ? ?Wheelchair ? ? ? ? ?Assist Is the patient using a wheelchair?: Yes ?Type of Wheelchair: Manual ?  ? ?Wheelchair assist level: Dependent - Patient 0% ?    ? ? ?Wheelchair 50 feet with 2 turns activity ? ? ? ?Assist ? ?  ?  ? ? ?Assist Level: Dependent - Patient 0%  ? ?Wheelchair 150 feet activity  ? ? ? ?Assist ?   ? ? ?Assist Level: Dependent - Patient 0%  ? ?Blood pressure (!) 114/56, pulse 89, temperature 98.5 ?F (36.9 ?C), resp. rate 19, height '5\' 3"'$  (1.6 m), weight 85.1 kg, last menstrual period 09/16/2012, SpO2 96 %. ? ?Medical Problem List and Plan: ?1. Functional deficits secondary to operative complications including respiratory failure status post right hemicolectomy for neuroendocrine tumor. ?            -patient may shower ?            -ELOS/Goals: tentative dc date of 4/29        ?          -Continue CIR therapies including PT, OT  ?2.  Antithrombotics: ?-DVT/anticoagulation:  Pharmaceutical: Lovenox ?            -antiplatelet therapy: None ?3. Abdominal pain with IV medication administration: Tylenol, oxycodone as needed.   ?-4/24 Continue to utilize oxycodone 5-10 mg q4 hrs as needed for abdominal pain ?4. Mood: LCSW to evaluate and provide emotional support ?            -- On scheduled Valium 4 mg 3 times daily for anxiety ?            -antipsychotic agents: n/a ?5. Neuropsych: This patient is capable of making decisions on her own behalf. ?6. Skin/Wound Care: Routine skin care checks ?7. Fluids/Electrolytes/Nutrition: Routine I's and O's and follow-up chemistries ?            -- Regular diet/thin liquids 4/17 ?            - ?8: Tracheostomy 4/3>>now in 4CL, capped 4/18 ?            -trach out, dressing in place.  ? 4/26-weaning oxygen to off. On 1L currently [place] this morning  ?9: Volume overload:   ?Filed Weights  ? 06/19/21 0508 06/20/21 0500 06/22/21 0700  ?Weight: 84.1 kg 84.2 kg 85.1 kg  ?  -weights down from admission ? -does not appear edematous ? -lasix exacerbating hypokalemia---holding lasix for now ?11: ABLA: H and H low but stable; continue to monitor, follow-up CBC 4/25 ?12: Epilepsy>>no new seizure activity ?            --post-op  encephalopathy likely due to Depakote, now on Keppra ?            --neuro rec: switching to zonasimide 100 mg daily ?13: Chiari malformation s/p V-P shunt 2018 ?            --  ID work-up this admission; negative L-P ?14: Von Willebrand disease: stable perioperative course ?15: Neuroendocrine tumor s/p right hemicolectomy: follow-up with Dr. Marin Olp ?16: Biliary dyskinesia: s/p cholecystectomy with severe diarrhea ?            -continued diarrhea, does not have characteristics of c diff per pt/rn ? -resumed questran, continue florastor, imodium ? -held prosource via tube ? -encouraging po ? -4/26---some improvement ?  -continue lomotil, questran, florastor, metamucil ?  -prn imodium ?  -encouraging PO ?17. Tachycardia: increase coreg 3.'125mg'$  to BID  ?-4/22 Improved with HR in the 90's, continue Coreg 3.125 mg BID ?-4/24 HR stable in the 90's on current regimen Coreg 3.125 mg BID ? ? ?  06/22/2021  ?  7:00 AM 06/22/2021  ?  6:30 AM 06/21/2021  ?  9:40 PM  ?Vitals with BMI  ?Weight 187 lbs 10 oz    ?BMI 33.24    ?Systolic  482 500  ?Diastolic  56 57  ?Pulse  89 93  ?  ?18. Decreased appetite: encourage po  ?19. Hypokalemia: changed potassium to oral but she could not tolerate this- discontinued potassium as we are decreasing Lasix dose by half, repeat on Monday.  ?-4/26-potassium 3.4 5oday ? -continue klor-con-at 82mq bid ? -recheck bmet tomorrow ? -lasix stopped ? ?  Latest Ref Rng & Units 06/22/2021  ?  5:56 AM 06/21/2021  ?  5:36 AM 06/20/2021  ?  5:17 AM  ?BMP  ?Glucose 70 - 99 mg/dL 103   109   108    ?BUN 6 - 20 mg/dL '5   5   5    '$ ?Creatinine 0.44 - 1.00 mg/dL 0.62   0.67   0.59    ?Sodium 135 - 145 mmol/L 136   137   136    ?Potassium 3.5 - 5.1 mmol/L 3.4   3.7   3.2    ?Chloride 98 - 111 mmol/L 99   98   96    ?CO2 22 - 32 mmol/L '28   29   30    '$ ?Calcium 8.9 - 10.3 mg/dL 9.1   9.6   9.2    ?  ?  ? ?LOS: ?7 days ?A FACE TO FACE EVALUATION WAS PERFORMED ? ?ZMeredith Staggers?06/22/2021, 8:43 AM  ? ?  ?

## 2021-06-23 LAB — BASIC METABOLIC PANEL
Anion gap: 11 (ref 5–15)
BUN: 7 mg/dL (ref 6–20)
CO2: 28 mmol/L (ref 22–32)
Calcium: 9.2 mg/dL (ref 8.9–10.3)
Chloride: 98 mmol/L (ref 98–111)
Creatinine, Ser: 0.67 mg/dL (ref 0.44–1.00)
GFR, Estimated: >60 mL/min (ref >60)
Glucose, Bld: 105 mg/dL — ABNORMAL HIGH (ref 70–99)
Potassium: 3.5 mmol/L (ref 3.5–5.1)
Sodium: 137 mmol/L (ref 135–145)

## 2021-06-23 MED ORDER — POTASSIUM CHLORIDE CRYS ER 20 MEQ PO TBCR
20.0000 meq | EXTENDED_RELEASE_TABLET | Freq: Two times a day (BID) | ORAL | Status: DC
  Administered 2021-06-24 – 2021-06-25 (×3): 20 meq via ORAL
  Filled 2021-06-23 (×4): qty 1

## 2021-06-23 MED ORDER — OXYCODONE HCL 5 MG PO TABS
5.0000 mg | ORAL_TABLET | ORAL | Status: DC | PRN
  Administered 2021-06-23 – 2021-06-24 (×3): 10 mg via ORAL
  Filled 2021-06-23 (×3): qty 2

## 2021-06-23 MED ORDER — ADULT MULTIVITAMIN W/MINERALS CH
1.0000 | ORAL_TABLET | Freq: Every day | ORAL | Status: DC
  Administered 2021-06-23 – 2021-06-25 (×3): 1 via ORAL
  Filled 2021-06-23 (×3): qty 1

## 2021-06-23 NOTE — Progress Notes (Signed)
Patient ID: Cassidy Tran, female   DOB: 1975-10-23, 46 y.o.   MRN: [id number] ? ?06/22/2021- SW met with pt and pt parents in room to discuss DME recs: RW and TTB. She would like SW to order DME items. She confirms she will d/c to [facility]. Preferred outpatient location is [facility].  ? ? ?06/23/2021- SW ordered DME: RW and TTB with [facility] via parachute.  ? ?SW confirmed with PT pt will need home o2. SW discussed with pt. D/c address: [address], [place], [place] [address].  Pt husband will be at this address tomorrow for home o2 delivery. SW submitted order to [facility] via parachute requesting home o2 delivery tomorrow. SW faxed outpatient PT/OT referral to [facility] (p: [phone]/f:[phone]). ? ?Loralee Pacas, MSW, LCSWA ?Office: [phone] ?Cell: [phone] ?Fax: [phone]  ?

## 2021-06-23 NOTE — Progress Notes (Addendum)
Speech Language Pathology Discharge Summary ? ?Patient Details  ?Name: [name] ?MRN: [id number] ?Date of Birth: [birth year] ? ?Today's Date: 06/23/2021 ?SLP Individual Time: 5300-5110 ?SLP Individual Time Calculation (min): 55 min ? ? ?Skilled Therapeutic Interventions:  Skilled treatment session focused on cognitive goals and completion of family education. Patient participated in a complex computer navigation task with overall Mod I and participated in composing an e-mail with overall Mod I. Patient reported intermittent cognitive fatigue, therefore, SLP provided education. SLP also provided education to the patient and her family regarding memory compensatory strategies and how to maintain a healthy brain lifestyle. Both verbalized understanding and handouts were given to reinforce information. Patient left upright in bed with husband present.  ? ?Patient has met 4 of 4 long term goals.  Patient to discharge at overall Modified Independent level.  ? ?Reasons goals not met: N/A  ? ?Clinical Impression/Discharge Summary: Patient has made functional gains and has met 3 of 3 LTGs this admission. Currently, patient is overall Mod I to complete functional and mildly complex tasks safely in regards to problem solving and recall with use of strategies. Patient is also consuming regular textures with thin liquids without overt s/s of aspiration and is overall Mod I for use of swallowing compensatory strategies. Patient and family education is complete and patient will discharge home with 24 hour supervision from family. Patient is at her cognitive baseline, therefore, skilled SLP f/u is not warranted at this time. Patient and family verbalized understanding and agreement.  ? ?Care Partner:  ?Caregiver Able to Provide Assistance: Yes  ?  ? ?Recommendation:  ?None  ?   ? ?Equipment: N/A  ? ?Reasons for discharge: Treatment goals met  ? ?Patient/Family Agrees with Progress Made and Goals Achieved: Yes  ? ? ?Brown Dunlap,  Munachimso Rigdon ?06/23/2021, 12:29 PM ? ?

## 2021-06-23 NOTE — Progress Notes (Signed)
Occupational Therapy Session Note ? ?Patient Details  ?Name: Cassidy Tran ?MRN: [id number] ?Date of Birth: 1975/12/09 ? ?Today's Date: 06/23/2021 ?OT Individual Time: 0900-1000 ?OT Individual Time Calculation (min): 60 min  ? ? ?Short Term Goals: ?Week 1:  OT Short Term Goal 1 (Week 1): Pt will transfer to toilet with supervision and O2 remain >90% on supp O2 to dmeo improved hemodynamic stability ?OT Short Term Goal 2 (Week 1): Pt will complete 2/3 steps of toileting ?OT Short Term Goal 3 (Week 1): Pt will bathe at shower level with no more than min A ?OT Short Term Goal 4 (Week 1): Pt will need no more than min cuing for breathing strategies during funcitonal transfers to improve recall during transitional movements ? ?Skilled Therapeutic Interventions/Progress Updates:  ? Pt received in bathroom with husband present with pain in foot improved with offloading shoe donned. ? ?Therapeutic activity ?Session focus on family education with discussion of goals being set up for pt and pts husband role to help with management of O2 cord PRN and present for safety in case of LOB, however pt with no LOB during this session. Pt husband diligently able to recall to monitor O2 after transfers and on 2L pt O remains >90%. Pt and husband able to transfer to TTB and [facility] transfers with no cuing from OT after Ot demo sequence to husbnad. Pt able ot teach back sequence to husband. Provided handouts and education on energy conservation and a HEP for at home ? ?Access Code: [email or web address] ?URL: [email or web address] ?Date: 06/23/2021 ?Prepared by: Mariane Masters ? ?Exercises ?- Seated Overhead Press with Dumbbells  - 1 x daily - 7 x weekly - 3 sets - 10 reps ?- Seated Shoulder Abduction with Dumbbells - Thumbs Up  - 1 x daily - 7 x weekly - 3 sets - 10 reps ?- Seated Chest Press with Dumbbells  - 1 x daily - 7 x weekly - 3 sets - 10 reps ?- Seated Single Arm Shoulder Flexion with Dumbbells  - 1 x daily - 7 x weekly - 3  sets - 10 reps ?- Sit to Stand with Hands on Knees  - 1 x daily - 7 x weekly - 3 sets - 10 reps ? ?Pt with no questions. Will complete more education tomorrow PRN. ? ?Pt left at end of session in bed with family present, call light in reach and all needs met ? ? ?Therapy Documentation ?Precautions:  ?Precautions ?Precautions: Fall ?Precaution Comments: (P) trach collar- capped, watch HR & O2 ?Restrictions ?Weight Bearing Restrictions: No ? ?Therapy/Group: Individual Therapy ? ?[name] ?06/23/2021, 6:51 AM ?

## 2021-06-23 NOTE — Progress Notes (Signed)
Physical Therapy Discharge Summary ? ?Patient Details  ?Name: Cassidy Tran ?MRN: [id number] ?Date of Birth: Sep 28, 1975 ? ?Today's Date: 06/24/2021 ? ? ?Patient has met 8 of 9 long term goals due to improved activity tolerance, improved balance, improved postural control, increased strength, decreased pain, ability to compensate for deficits, improved attention, improved awareness, and improved coordination.  Patient to discharge at an ambulatory level Supervision with a RW.   Patient's care partner is independent to provide the necessary physical and cognitive assistance at discharge. ? ?Reasons goals not met: Patient with change in d/c plan to go to home with ramped entry. Patient no longer needs to be able to perform stairs or meet stair goal for d/c. Patient able to ambulate up/down a ramp with supervision using a RW for home entry.  ? ?Recommendation:  ?Patient will benefit from ongoing skilled PT services in outpatient setting to continue to advance safe functional mobility, address ongoing impairments in balance, activity tolerance, functional mobility, gait and stair training, strengthening, patient/caregiver education, and minimize fall risk. ? ?Equipment: ?RW ? ?Reasons for discharge: treatment goals met ? ?Patient/family agrees with progress made and goals achieved: Yes ? ?PT Discharge ?Precautions/Restrictions ?Precautions ?Precautions: Fall ?Precaution Comments: PEG tube, 2L/min O2 with mobility ?Required Braces or Orthoses: Other Brace ?Other Brace: R post op shoe ?Restrictions ?Weight Bearing Restrictions: Yes ?RLE Weight Bearing: Weight bearing as tolerated ?Pain Interference ?Pain Interference ?Pain Effect on Sleep: 2. Occasionally ?Pain Interference with Therapy Activities: 2. Occasionally ?Pain Interference with Day-to-Day Activities: 2. Occasionally ?Vision/Perception  ?Vision - History ?Ability to See in Adequate Light: 0 Adequate ?Perception ?Perception: Within Functional  Limits ?Praxis ?Praxis: Intact  ?Cognition ?Overall Cognitive Status: Within Functional Limits for tasks assessed ?Orientation Level: Oriented X4 ?Safety/Judgment: Appears intact ?Sensation ?Sensation ?Light Touch: Impaired Detail ?Peripheral sensation comments: Relates comparable sensation R to L with L2, 3, 4 through thigh region, L5 over R anterior ankle area is reported with n/t and pain assoviated with tingling, diminished on Lfor L5/ S1/ S2 on posterior lower leg. ?Light Touch Impaired Details: Impaired LLE;Impaired RLE ?Coordination ?Gross Motor Movements are Fluid and Coordinated: No ?Fine Motor Movements are Fluid and Coordinated: No ?Coordination and Movement Description: generalized deconditioning with all mobility ?Heel Shin Test: hesitant to perform with L d/t pain with palpation to anterior R ankle and lower shin; touches shin with RLE with decreased range on shin ?Motor  ?Motor ?Motor: Abnormal postural alignment and control;Other (comment) ?Motor - Discharge Observations: generalized deconditioning impacting balance and postural control and functional mobility (improved since admission)  ?Mobility ?Bed Mobility ?Rolling Right: Independent ?Rolling Left: Independent ?Supine to Sit: Independent ?Sit to Supine: Independent ?Transfers ?Sit to Stand: Supervision/Verbal cueing ?Stand to Sit: Supervision/Verbal cueing ?Stand Pivot Transfers: Supervision/Verbal cueing ?Stand Pivot Transfer Details: Verbal cues for safe use of DME/AE;Verbal cues for technique ?Transfer (Assistive device): Rolling walker ?Locomotion  ?Gait ?Ambulation: Yes ?Gait Assistance: Supervision/Verbal cueing ?Gait Distance (Feet): 150 Feet ?Assistive device: Rolling walker ?Gait Assistance Details: Verbal cues for safe use of DME/AE;Verbal cues for gait pattern ?Gait Assistance Details: 2 L/min O2 ?Gait ?Gait: Yes ?Gait Pattern: Impaired ?Gait Pattern: Decreased stride length;Decreased dorsiflexion - right;Decreased trunk  rotation;Trunk flexed ?Gait velocity: reduced  ?Trunk/Postural Assessment  ?Cervical Assessment ?Cervical Assessment: Exceptions to [facility] (forward head) ?Thoracic Assessment ?Thoracic Assessment: Exceptions to [facility] (rounded shoulders) ?Lumbar Assessment ?Lumbar Assessment: Exceptions to [facility] (lumbar lordosis) ?Postural Control ?Postural Control: Deficits on evaluation (decreased/delayed)  ?Balance ?Balance ?Balance Assessed: Yes ?Standardized Balance Assessment ?Standardized Balance Assessment: Merrilee Jansky  Balance Test ?Merrilee Jansky Balance Test ?Sit to Stand: Able to stand without using hands and stabilize independently ?Standing Unsupported: Able to stand 2 minutes with supervision ?Sitting with Back Unsupported but Feet Supported on Floor or Stool: Able to sit safely and securely 2 minutes ?Stand to Sit: Sits safely with minimal use of hands ?Transfers: Able to transfer safely, definite need of hands ?Standing Unsupported with Eyes Closed: Able to stand 10 seconds with supervision ?Standing Ubsupported with Feet Together: Needs help to attain position but able to stand for 30 seconds with feet together ?From Standing, Reach Forward with Outstretched Arm: Reaches forward but needs supervision ?From Standing Position, Pick up Object from Floor: Able to pick up shoe, needs supervision ?From Standing Position, Turn to Look Behind Over each Shoulder: Needs supervision when turning (increased abdominal pain on grad day) ?Turn 360 Degrees: Able to turn 360 degrees safely but slowly (increased abdominal pain on grad day; c/o of dizziness after one and requires seated rest break) ?Standing Unsupported, Alternately Place Feet on Step/Stool: Needs assistance to keep from falling or unable to try ?Standing Unsupported, One Foot in Front: Able to take small step independently and hold 30 seconds ?Standing on One Leg: Unable to try or needs assist to prevent fall ?Total Score: 31 ?Static Sitting Balance ?Static Sitting - Balance Support: Feet  supported;No upper extremity supported ?Static Sitting - Level of Assistance: 7: Independent ?Dynamic Sitting Balance ?Dynamic Sitting - Balance Support: Feet supported;No upper extremity supported ?Dynamic Sitting - Level of Assistance: 7: Independent ?Static Standing Balance ?Static Standing - Balance Support: During functional activity;No upper extremity supported ?Static Standing - Level of Assistance: 5: Stand by assistance ?Dynamic Standing Balance ?Dynamic Standing - Balance Support: During functional activity;Right upper extremity supported;Left upper extremity supported ?Dynamic Standing - Level of Assistance: 5: Stand by assistance ?Extremity Assessment  ?RLE Assessment ?RLE Assessment: Exceptions to [facility] ?Active Range of Motion (AROM) Comments: R DF AROM absent ?General Strength Comments: Grossly 3+/5 at hip, 3- to 3/5 at knee, and 1/5 for DF, 3+/ 5 for PF ?LLE Assessment ?LLE Assessment: Exceptions to [facility] ?Active Range of Motion (AROM) Comments: WFL for functional mobility ?General Strength Comments: Grossly at least 3+ to 4/5 throughout with functional mobility ? ? ? ?Doreene Burke PT, DPT ? ?06/24/2021, 5:36 PM ?

## 2021-06-23 NOTE — Progress Notes (Signed)
Recreational Therapy Session Note ? ?Patient Details  ?Name: Cassidy Tran ?MRN: [id number] ?Date of Birth: 1975/05/26 ?Today's Date: 06/23/2021 ? ?Pain: no c/o ?Skilled Therapeutic Interventions/Progress Updates: Pt participated in stress managment/coping group today per team referral.  Pt education/discussion focused on stress exploration including factors that contribute to stress, factors that protect against stress and potential coping strategies.  Coping strategies included deep breathing, progressive muscle relaxation, imagery & challenging irrational thoughts. Pt actively participated in discussion and appreciative of the information and socialization. Handouts provided. ? ? ?Therapy/Group: Group Therapy ? ?Shalese Strahan ?06/23/2021, 4:23 PM  ?

## 2021-06-23 NOTE — Progress Notes (Signed)
?                                                       PROGRESS NOTE ? ? ?Subjective/Complaints: ? ?Pt feeling better today. Stools slowing, more formed. Has been refusing questran and metamucil. Right foot pain noted yesterday and non-displaced subacute fx of base fifth MT on xray ? ?ROS: Patient denies fever, rash, sore throat, blurred vision, dizziness, nausea, vomiting,   cough, shortness of breath or chest pain,  headache, or mood change.  ? ? ?Objective: ?  ?DG Ankle Complete Right ? ?Result Date: 06/22/2021 ?CLINICAL DATA:  Increasing pain.  No injury. EXAM: RIGHT ANKLE - COMPLETE 3+ VIEW; RIGHT FOOT COMPLETE - 3+ VIEW COMPARISON:  None. FINDINGS: Right ankle: Mild distal medial malleolar degenerative spurring. The ankle mortise is symmetric and intact. Right foot: There is a subtle transverse lucency at the base of fifth metatarsal suggesting an acute to subacute nondisplaced fracture. Mild great toe metatarsophalangeal joint and second through fifth digit interphalangeal joint space narrowing degenerative change. No dislocation. IMPRESSION: Nondisplaced acute to subacute fracture of the base of fifth metatarsal. Electronically Signed   By: Yvonne Kendall M.D.   On: 06/22/2021 12:11  ? ?DG Foot Complete Right ? ?Result Date: 06/22/2021 ?CLINICAL DATA:  Increasing pain.  No injury. EXAM: RIGHT ANKLE - COMPLETE 3+ VIEW; RIGHT FOOT COMPLETE - 3+ VIEW COMPARISON:  None. FINDINGS: Right ankle: Mild distal medial malleolar degenerative spurring. The ankle mortise is symmetric and intact. Right foot: There is a subtle transverse lucency at the base of fifth metatarsal suggesting an acute to subacute nondisplaced fracture. Mild great toe metatarsophalangeal joint and second through fifth digit interphalangeal joint space narrowing degenerative change. No dislocation. IMPRESSION: Nondisplaced acute to subacute fracture of the base of fifth metatarsal. Electronically Signed   By: Yvonne Kendall M.D.   On: 06/22/2021  12:11   ?Recent Labs  ?  06/21/21 ?9326 06/22/21 ?7124  ?WBC 9.5 8.6  ?HGB 9.6* 8.9*  ?HCT 30.0* 28.9*  ?PLT 380 356  ? ? ?Recent Labs  ?  06/22/21 ?5809 06/23/21 ?0602  ?NA 136 137  ?K 3.4* 3.5  ?CL 99 98  ?CO2 28 28  ?GLUCOSE 103* 105*  ?BUN 5* 7  ?CREATININE 0.62 0.67  ?CALCIUM 9.1 9.2  ? ? ?Intake/Output Summary (Last 24 hours) at 06/23/2021 1101 ?Last data filed at 06/23/2021 0900 ?Gross per 24 hour  ?Intake 720 ml  ?Output 0 ml  ?Net 720 ml  ?  ? ?  ? ?Physical Exam: ?Vital Signs ?Blood pressure (!) 114/53, pulse 89, temperature 99.1 ?F (37.3 ?C), resp. rate 16, height '5\' 3"'$  (1.6 m), weight 85.1 kg, last menstrual period 09/16/2012, SpO2 92 %. ? ?Constitutional: No distress . Vital signs reviewed. ?HEENT: NCAT, EOMI, oral membranes moist ?Neck: supple ?Cardiovascular: RRR without murmur. No JVD    ?Respiratory/Chest: CTA Bilaterally without wheezes or rales. Normal effort    ?GI/Abdomen: BS +, sl-tender, non-distended. PEG site clean ?Ext: no clubbing, cyanosis, or edema ?Psych: pleasant and cooperative  ?Musculoskeletal: FROM, Rt upper arm PICC removed on 4/21 ?Neurological: Alert and oriented x 4. 3/5 strength throughout. ?Skin: Warm and dry. ?Ext: no clubbing, cyanosis, or edema ?  ? ? ?Assessment/Plan: ?1. Functional deficits which require 3+ hours per day of interdisciplinary therapy in a comprehensive  inpatient rehab setting. ?Physiatrist is providing close team supervision and 24 hour management of active medical problems listed below. ?Physiatrist and rehab team continue to assess barriers to discharge/monitor patient progress toward functional and medical goals ? ?Care Tool: ? ?Bathing ? Bathing activity did not occur: Refused ?Body parts bathed by patient: Abdomen, Chest, Left arm, Right upper leg, Left upper leg, Face  ? Body parts bathed by helper: Front perineal area, Buttocks ?  ?  ?Bathing assist Assist Level: Minimal Assistance - Patient > 75% ?  ?  ?Upper Body Dressing/Undressing ?Upper body  dressing   ?What is the patient wearing?: [place] only ?   ?Upper body assist Assist Level: Total Assistance - Patient < 25% ?   ?Lower Body Dressing/Undressing ?Lower body dressing ? ? ? Lower body dressing activity did not occur: Refused ?What is the patient wearing?: Pants ? ?  ? ?Lower body assist Assist for lower body dressing: Minimal Assistance - Patient > 75% ?   ? ?Toileting ?Toileting    ?Toileting assist Assist for toileting: Minimal Assistance - Patient > 75% ?  ?  ?Transfers ?Chair/bed transfer ? ?Transfers assist ?   ? ?Chair/bed transfer assist level: Supervision/Verbal cueing ?Chair/bed transfer assistive device: Walker ?  ?Locomotion ?Ambulation ? ? ?Ambulation assist ? ?   ? ?Assist level: Supervision/Verbal cueing ?Assistive device: Walker-rolling ?Max distance: >150 ft  ? ?Walk 10 feet activity ? ? ?Assist ?   ? ?Assist level: Supervision/Verbal cueing ?Assistive device: Walker-rolling  ? ?Walk 50 feet activity ? ? ?Assist Walk 50 feet with 2 turns activity did not occur: Safety/medical concerns ? ?Assist level: Supervision/Verbal cueing ?Assistive device: Walker-rolling  ? ? ?Walk 150 feet activity ? ? ?Assist Walk 150 feet activity did not occur: Safety/medical concerns ? ?Assist level: Supervision/Verbal cueing ?Assistive device: Walker-rolling ?  ? ?Walk 10 feet on uneven surface  ?activity ? ? ?Assist Walk 10 feet on uneven surfaces activity did not occur: Safety/medical concerns ? ? ?  ?   ? ?Wheelchair ? ? ? ? ?Assist Is the patient using a wheelchair?: Yes ?Type of Wheelchair: Manual ?  ? ?Wheelchair assist level: Dependent - Patient 0% ?   ? ? ?Wheelchair 50 feet with 2 turns activity ? ? ? ?Assist ? ?  ?  ? ? ?Assist Level: Dependent - Patient 0%  ? ?Wheelchair 150 feet activity  ? ? ? ?Assist ?   ? ? ?Assist Level: Dependent - Patient 0%  ? ?Blood pressure (!) 114/53, pulse 89, temperature 99.1 ?F (37.3 ?C), resp. rate 16, height '5\' 3"'$  (1.6 m), weight 85.1 kg, last menstrual  period 09/16/2012, SpO2 92 %. ? ?Medical Problem List and Plan: ?1. Functional deficits secondary to operative complications including respiratory failure status post right hemicolectomy for neuroendocrine tumor. ?            -patient may shower ?            -ELOS/Goals: target dc date of 4/29        ?         -Continue CIR therapies including PT, OT   ?2.  Antithrombotics: ?-DVT/anticoagulation:  Pharmaceutical: Lovenox ?            -antiplatelet therapy: None ?3. Abdominal pain with IV medication administration: Tylenol, oxycodone as needed.   ?-4/24 Continue to utilize oxycodone 5-10 mg q4 hrs as needed for abdominal pain ?4. Mood: LCSW to evaluate and provide emotional support ?            --  On scheduled Valium 4 mg 3 times daily for anxiety ?            -antipsychotic agents: n/a ?5. Neuropsych: This patient is capable of making decisions on her own behalf. ?6. Skin/Wound Care: Routine skin care checks ?7. Fluids/Electrolytes/Nutrition: Routine I's and O's and follow-up chemistries ?            -- Regular diet/thin liquids 4/17 ?            - ?8: Tracheostomy 4/3>>now in 4CL, capped 4/18 ?            -trach out, stoma closed. Can transition to bandaid.  ? 4/27-weaning oxygen to off. On 1L currently [place] this morning--if can't wean today will need to go home on O2 ?9: Volume overload:   ?Filed Weights  ? 06/22/21 0700 06/22/21 1002 06/23/21 0442  ?Weight: 85.1 kg 85.1 kg 85.1 kg  ?  -weights down from admission ? -does not appear edematous ? -lasix exacerbating hypokalemia---holding lasix for now ?11: ABLA: H and H low but stable; continue to monitor, follow-up CBC 4/25 ?12: Epilepsy>>no new seizure activity ?            --post-op encephalopathy likely due to Depakote, now on Keppra ?            --neuro rec:  zonasimide 100 mg daily ?13: Chiari malformation s/p V-P shunt 2018 ?            --ID work-up this admission; negative L-P ?14: Von Willebrand disease: stable perioperative course ?15: Neuroendocrine tumor  s/p right hemicolectomy: follow-up with Dr. Marin Olp ?16: Biliary dyskinesia: s/p cholecystectomy with severe diarrhea ?            -continued diarrhea, does not have characteristics of c diff per pt/rn ? -resume

## 2021-06-23 NOTE — Progress Notes (Signed)
Pt refused all sleep aids, states she feel too tired throughout the day and feels that the Valium she received at 1830 is sufficient enough. Pt appears to have already been asleep when nurse entered the room.pt also refused potassium, education given.  ?

## 2021-06-23 NOTE — Progress Notes (Signed)
Nutrition Follow-up ? ?DOCUMENTATION CODES:  ? ?Not applicable ? ?INTERVENTION:  ? ?Multivitamin w/ minerals daily ?[place] ? ?NUTRITION DIAGNOSIS:  ? ?Increased nutrient needs related to  (healing) as evidenced by estimated needs. - Ongoing ? ?GOAL:  ? ?Patient will meet greater than or equal to 90% of their needs - Ongoing ? ?MONITOR:  ? ?PO intake, Supplement acceptance, TF tolerance, Labs, Weight trends, I & O's ? ?REASON FOR ASSESSMENT:  ? ?New TF ?  ? ?ASSESSMENT:  ? ?46 year old female admitted on 05/05/2021 for planned right hemicolectomy secondary to neuroendocrine tumor.  She also had diagnosis of biliary dyskinesia and underwent concomitant cholecystectomy.  She has a history of von Willebrand's disease. She ultimately required intubation on 3/16.  Required PEG placement. Pt requires CIR for ongoing dysfunction secondary to debility related to postoperative respiratory failure, right hemicolectomy, encephalopathy. ? ?4/20 - decannulated ?4/22 - Tube feeds discontinued  ? ?Pt resting in bed, reports that her appetite has improved and PO intake is improving. States that she is trying to relearn her new cues, trying to determine if she is hungry vs nauseous vs stomach just hurting. Pt reports that she was not drinking the [employer].  ?Per EMR, pt intake includes: ?4/24: Dinner 25% ?4/25: Breakfast 80%, Lunch 95%, Dinner 80% ?4/26: Breakfast 75%, Lunch 60%, Dinner 75% ?4/27: Breakfast 50% ? ?Reports that she already has an appointment on the 11th to get PEG removed.  ? ?Medications reviewed and include: Lomotil, Potassium Chloride, Psyllium  ?Labs reviewed: Potassium 3.5 ? ?Diet Order:   ?Diet Order   ? ?       ?  Diet regular Room service appropriate? Yes with Assist; Fluid consistency: Thin  Diet effective now       ?  ? ?  ?  ? ?  ? ?EDUCATION NEEDS:  ? ?Not appropriate for education at this time ? ?Skin:  Skin Integrity Issues:: Incisions ?Incisions: abdomen, neck ? ?Last BM:  4/27 - Type  7 ? ?Height:  ?Ht Readings from Last 1 Encounters:  ?06/15/21 '5\' 3"'$  (1.6 m)  ? ?Weight:  ?Wt Readings from Last 1 Encounters:  ?06/23/21 85.1 kg  ? ?BMI:  Body mass index is 33.23 kg/m?. ? ?Estimated Nutritional Needs:  ?Kcal:  1900-2100 ?Protein:  100-115 grams ?Fluid:  >/= 1.9 L/day ? ? ?Hermina Barters RD, LDN ?Clinical Dietitian ?See AMiON for contact information.  ?

## 2021-06-23 NOTE — Progress Notes (Signed)
Physical Therapy Session Note ? ?Patient Details  ?Name: Cassidy Tran ?MRN: [id number] ?Date of Birth: [birth year] ? ?Today's Date: 06/23/2021 ?PT Individual Time: 4650-3546 ?PT Individual Time Calculation (min): 60 min  ? ?Short Term Goals: ?Week 1:  PT Short Term Goal 1 (Week 1): Patient will perform basic transfers with CGA consistently using LRAD. ?PT Short Term Goal 2 (Week 1): Patient will ambulate >50 feet using LRAD maintaining O2 saturations >90% with <6L/min O2. ?PT Short Term Goal 3 (Week 1): Patient will initiate stair training. ? ?Skilled Therapeutic Interventions/Progress Updates:  ?   ?Patient in bathroom with her husband upon PT arrival. Patient alert and agreeable to PT session. Patient denied pain during session. ? ?Patient's husband and father present for family education and hands on training throughout session. Performed safe guarding with all mobility following PT cues and/or demonstration. Educated on fall risk/prevention, home modifications to prevent falls, and activation of emergency services in the event of a fall during session. ? ?Educated on O2 home management and appropriate O2 saturation levels and management. Patient's husband appropriately managed O2 line and tank with all mobility throughout session, demonstrated turning O2 on and off appropriately and connecting [place] to portable O2 tank and wall unit.    ? ?Therapeutic Activity: ?Bed Mobility: Patient performed supine to/from sit with mod I with elevated HOB, simulating home set-up with wedge pillow.  ?Transfers: Patient performed sit to/from stand x3 with supervision using RW. Provided verbal cues for reaching back to sit x2. ?Patient performed a simulated Jeep height car transfer with supervision using RW. Provided cues for safe technique. ? ?Gait Training:  ?Patient ambulated up/down a ramp with supervision using RW. Provided cues for technique and use of AD. ?O2 Walking Test: ?Patient saturations on room air at rest =  93% ?Patient saturations on room air while ambulating = 81% after 50 ft using RW ?Patient saturations on 2 Liters of oxygen while ambulating = 91% after 100 ft using RW ? ?Patient will require home O2 due to desaturation to low 80's with household level mobility. Requires 2L/min to maintain O2 saturations >90% with household and community level mobility.  ? ?Patient sitting up in bed at end of session with breaks locked and all needs within reach. Patient performed RA trial in bed at end of session, SPO2 fluctuated between 88-93%, placed patient on 1.5 L/min, SPO2 97%, RN made aware.  ? ?Patient and her family appreciative and decline further questions, concerns at end of session.   ? ?Therapy Documentation ?Precautions:  ?Precautions ?Precautions: Fall ?Precaution Comments: (P) trach collar- capped, watch HR & O2 ?Restrictions ?Weight Bearing Restrictions: No ? ? ? ?Therapy/Group: Individual Therapy ? ?Doreene Burke PT, DPT ? ?06/23/2021, 12:54 PM  ?

## 2021-06-23 NOTE — Progress Notes (Signed)
Occupational Therapy Session Note ? ?Patient Details  ?Name: Cassidy Tran ?MRN: [id number] ?Date of Birth: 1975-07-02 ? ?Today's Date: 06/23/2021 ?OT Group Time: 3235-5732 ?OT Group Time Calculation (min): 59 min ? ? ?Short Term Goals: ?Week 1:  OT Short Term Goal 1 (Week 1): Pt will transfer to toilet with supervision and O2 remain >90% on supp O2 to dmeo improved hemodynamic stability ?OT Short Term Goal 2 (Week 1): Pt will complete 2/3 steps of toileting ?OT Short Term Goal 3 (Week 1): Pt will bathe at shower level with no more than min A ?OT Short Term Goal 4 (Week 1): Pt will need no more than min cuing for breathing strategies during funcitonal transfers to improve recall during transitional movements ? ?Skilled Therapeutic Interventions/Progress Updates:  ?Pt participated in group session with a focus on stress mgmt, education on healthy coping strategies, and social interaction. Focus of session on providing coping strategies to manage new diagnosis to allow for improved mental health to increase overall quality of life . Discussed how to break down stressors into ?daily hassles,? ?major life stressors? and ?life circumstances? in an effort to allow pts to chunk their stressors into groups and determine where to best put their efforts/time when dealing with stress. Pt actively sharing stressors and contributing to group conversation. Provided active listening, emotional support and therapeutic use of self. Offered education on factors that protect [place] against stress such as ?daily uplifts,? ?healthy coping strategies? and ?protective factors.? Encouraged all group members to make an effort to actively recall one event from their day that was a daily uplift in an effort to protect their mindset from stressors as well as sharing this information with their caregivers to facilitate improved caregiver communication and decrease overall burden of care.  Issued pt handouts on healthy coping strategies to implement  into routine. ? Pt transported back to room by this OTA, assisted pt to toilet with Rw and CGA. Pt completed 3/3 toileting tasks with supervision. Pt ambulated back to bed with RW and CGA. Pt left seated EOB with all needs within reach.  ? ?Therapy Documentation ?Precautions:  ?Precautions ?Precautions: Fall ?Precaution Comments: (P) trach collar- capped, watch HR & O2 ?Restrictions ?Weight Bearing Restrictions: No ? ? ?Pain: no pain reported during session. ? ?Therapy/Group: Group Therapy ? ?Precious Haws ?06/23/2021, 4:20 PM ?

## 2021-06-24 ENCOUNTER — Inpatient Hospital Stay (HOSPITAL_COMMUNITY): Payer: No Typology Code available for payment source

## 2021-06-24 ENCOUNTER — Other Ambulatory Visit (HOSPITAL_COMMUNITY): Payer: Self-pay

## 2021-06-24 LAB — BASIC METABOLIC PANEL
Anion gap: 8 (ref 5–15)
BUN: 7 mg/dL (ref 6–20)
CO2: 26 mmol/L (ref 22–32)
Calcium: 9 mg/dL (ref 8.9–10.3)
Chloride: 100 mmol/L (ref 98–111)
Creatinine, Ser: 0.65 mg/dL (ref 0.44–1.00)
GFR, Estimated: >60 mL/min (ref >60)
Glucose, Bld: 104 mg/dL — ABNORMAL HIGH (ref 70–99)
Potassium: 3.5 mmol/L (ref 3.5–5.1)
Sodium: 134 mmol/L — ABNORMAL LOW (ref 135–145)

## 2021-06-24 MED ORDER — STIOLTO RESPIMAT 2.5-2.5 MCG/ACT IN AERS
2.0000 | INHALATION_SPRAY | Freq: Every day | RESPIRATORY_TRACT | 0 refills | Status: AC
  Filled 2021-06-24: qty 4, 30d supply, fill #0

## 2021-06-24 MED ORDER — DIAZEPAM 2 MG PO TABS
2.0000 mg | ORAL_TABLET | Freq: Two times a day (BID) | ORAL | 0 refills | Status: DC | PRN
  Filled 2021-06-24: qty 15, 8d supply, fill #0

## 2021-06-24 MED ORDER — ACETAMINOPHEN 325 MG PO TABS
325.0000 mg | ORAL_TABLET | ORAL | Status: AC | PRN
Start: 2021-06-24 — End: ?

## 2021-06-24 MED ORDER — BUDESONIDE 0.5 MG/2ML IN SUSP
0.2500 mg | Freq: Two times a day (BID) | RESPIRATORY_TRACT | 0 refills | Status: AC
  Filled 2021-06-24: qty 60, 30d supply, fill #0

## 2021-06-24 MED ORDER — POTASSIUM CHLORIDE CRYS ER 20 MEQ PO TBCR
20.0000 meq | EXTENDED_RELEASE_TABLET | Freq: Two times a day (BID) | ORAL | 0 refills | Status: DC
  Filled 2021-06-24: qty 14, 7d supply, fill #0

## 2021-06-24 MED ORDER — PSYLLIUM 95 % PO PACK: 1.0000 | PACK | Freq: Two times a day (BID) | ORAL | Status: DC

## 2021-06-24 MED ORDER — ADULT MULTIVITAMIN W/MINERALS CH: 1.0000 | ORAL_TABLET | Freq: Every day | ORAL | Status: DC

## 2021-06-24 MED ORDER — ATORVASTATIN CALCIUM 20 MG PO TABS
20.0000 mg | ORAL_TABLET | Freq: Every day | ORAL | 0 refills | Status: DC
  Filled 2021-06-24: qty 30, 30d supply, fill #0

## 2021-06-24 MED ORDER — MONTELUKAST SODIUM 10 MG PO TABS
ORAL_TABLET | ORAL | 0 refills | Status: DC
  Filled 2021-06-24: qty 30, 30d supply, fill #0

## 2021-06-24 MED ORDER — DIPHENOXYLATE-ATROPINE 2.5-0.025 MG PO TABS
2.0000 | ORAL_TABLET | Freq: Four times a day (QID) | ORAL | 0 refills | Status: AC
  Filled 2021-06-24: qty 30, 4d supply, fill #0

## 2021-06-24 MED ORDER — CARVEDILOL 3.125 MG PO TABS
3.1250 mg | ORAL_TABLET | Freq: Two times a day (BID) | ORAL | 0 refills | Status: AC
Start: 2021-06-24 — End: ?
  Filled 2021-06-24: qty 60, 30d supply, fill #0

## 2021-06-24 MED ORDER — PANTOPRAZOLE SODIUM 40 MG PO TBEC
40.0000 mg | DELAYED_RELEASE_TABLET | Freq: Every day | ORAL | 1 refills | Status: DC
  Filled 2021-06-24: qty 30, 30d supply, fill #0

## 2021-06-24 MED ORDER — OXYCODONE HCL 5 MG PO TABS
5.0000 mg | ORAL_TABLET | Freq: Four times a day (QID) | ORAL | 0 refills | Status: DC | PRN
  Filled 2021-06-24: qty 20, 3d supply, fill #0

## 2021-06-24 MED ORDER — SACCHAROMYCES BOULARDII 250 MG PO CAPS
250.0000 mg | ORAL_CAPSULE | Freq: Two times a day (BID) | ORAL | 0 refills | Status: DC
  Filled 2021-06-24: qty 60, 30d supply, fill #0

## 2021-06-24 MED ORDER — ZONISAMIDE 100 MG PO CAPS
100.0000 mg | ORAL_CAPSULE | Freq: Every day | ORAL | 0 refills | Status: DC
  Filled 2021-06-24: qty 30, 30d supply, fill #0

## 2021-06-24 NOTE — Progress Notes (Signed)
Inpatient Rehabilitation Discharge Medication Review by a Pharmacist ? ?A complete drug regimen review was completed for this patient to identify any potential clinically significant medication issues. ? ?High Risk Drug Classes Is patient taking? Indication by Medication  ?Antipsychotic No   ?Anticoagulant No   ?Antibiotic No   ?Opioid Yes OxyIR- acute pain  ?Antiplatelet No   ?Hypoglycemics/insulin No   ?Vasoactive Medication Yes coreg- hypertension  ?Chemotherapy No   ?Other Yes Valium- anxiety ?Protonix- GERD ?Singulair- asthma ?Budesonide, Stiolto- Asthma ?Zonisamide- seizure prophylaxis ?Prevalite, lipitor- HLD ?melatonin- sleep ?Nasonex- seasonal rhinitis  ? ? ? ?Type of Medication Issue Identified Description of Issue Recommendation(s)  ?Drug Interaction(s) (clinically significant) ?    ?Duplicate Therapy ?    ?Allergy ?    ?No Medication Administration End Date ?    ?Incorrect Dose ?    ?Additional Drug Therapy Needed ?    ?Significant med changes from prior encounter (inform family/care partners about these prior to discharge).    ?Other ?    ? ? ?Clinically significant medication issues were identified that warrant physician communication and completion of prescribed/recommended actions by midnight of the next day:  No ? ? ?Time spent performing this drug regimen review (minutes):  30 ? ? ?Herron Fero BS, PharmD, BCPS ?Clinical Pharmacist ?06/24/2021 10:27 AM ? ?Contact: [phone] after 3 PM ? ?"Be curious, not judgmental..." -Jamal Maes ?

## 2021-06-24 NOTE — Progress Notes (Signed)
Occupational Therapy Discharge Summary ? ?Patient Details  ?Name: Cassidy Tran ?MRN: [id number] ?Date of Birth: October 24, 1975 ? ?Today's Date: 06/24/2021 ?OT Individual Time: 0700-0800 ?OT Individual Time Calculation (min): 60 min  ? ? ?Pt received in bed with PEG pain unrated but repositioned in bed at end of session for comfort with no meds requested by pt  ?ADL: ?Pt completes ADL at overall set up Level. Skilled interventions include: overall pt only needs cuing for pacing throughout ADL on 2L O2. Edu with family on shower covering stoma with tegaderm. Pt requires increased time to go over sequence of bathing d/t anxiety. Pt completes all transfers with RW and WB shoe. Pt completes showering, toiletin and dressing all with set up. Pt requires extended rest break at end of session before set up with grooming.  ? ?Pt left at end of session in bed husband present, call light in reach and all needs met ? ? ?Patient has met 10 of 10 long term goals due to improved activity tolerance, improved balance, postural control, ability to compensate for deficits, and improved coordination.  Patient to discharge at overall Supervision level.  Patient's care partner is independent to provide the necessary  supervision  assistance at discharge.  Pt has made excellent progress to OT goals reaching set up level. Family has participated frequently in session and is ready to have pt come home with 24/7 supervision initially. Pt remains on 2L O2 but quickly weaning off.  ? ?Reasons goals not met: n/a ? ?Recommendation:  ?Patient will benefit from ongoing skilled OT services in outpatient setting to continue to advance functional skills in the area of iADL and Vocation, however no OT services present in her area. ? ?Equipment: ?No equipment provided ? ?Reasons for discharge: treatment goals met and discharge from hospital ? ?Patient/family agrees with progress made and goals achieved: Yes ? ?OT Discharge ?Precautions/Restrictions   ?Precautions ?Precautions: Fall ?Precaution Comments: PEG tube, 2L/min O2 with mobility ?Required Braces or Orthoses: Other Brace ?Other Brace: R post op shoe ?Restrictions ?Weight Bearing Restrictions: Yes ?RLE Weight Bearing: Weight bearing as tolerated ?General ?  ?Vital Signs ?Therapy Vitals ?Temp: 98 ?F (36.7 ?C) ?Temp Source: Oral ?Pulse Rate: 84 ?Resp: 18 ?BP: (!) 118/55 ?Patient Position (if appropriate): Lying ?Oxygen Therapy ?SpO2: 96 % ?O2 Device: Nasal Cannula ?Pain ?Pain Assessment ?Pain Score: 5  ?Pain Type: Acute pain ?Pain Location: Foot ?Pain Orientation: Right ?Pain Descriptors / Indicators: Aching ?ADL ?ADL ?Grooming: Independent ?Where Assessed-Grooming: Standing at sink ?Upper Body Bathing: Independent ?Where Assessed-Upper Body Bathing: Shower ?Lower Body Bathing: Setup ?Where Assessed-Lower Body Bathing: Shower ?Upper Body Dressing: Setup ?Where Assessed-Upper Body Dressing: Chair ?Lower Body Dressing: Setup ?Where Assessed-Lower Body Dressing: Chair ?Toileting: Setup ?Where Assessed-Toileting: Toilet ?Toilet Transfer: Distant supervision ?Toilet Transfer Method: Ambulating ?Toilet Transfer Equipment: Bedside commode ?Tub/Shower Transfer: Distant supervision ?Tub/Shower Transfer Method: Ambulating ?Vision ?Baseline Vision/History: 0 No visual deficits ?Patient Visual Report: No change from baseline ?Vision Assessment?: No apparent visual deficits ?Perception  ?Perception: Within Functional Limits ?Praxis ?Praxis: Intact ?Cognition ?Cognition ?Overall Cognitive Status: Within Functional Limits for tasks assessed ?Arousal/Alertness: Awake/alert ?Orientation Level: Person;Place;Situation ?Person: Oriented ?Place: Oriented ?Situation: Oriented ?Problem Solving: Impaired ?Safety/Judgment: Appears intact ?Brief Interview for Mental Status (BIMS) ?Repetition of Three Words (First Attempt): 3 ?Temporal Orientation: Year: Correct ?Temporal Orientation: Month: Accurate within 5 days ?Temporal  Orientation: Day: Correct ?Recall: "Sock": Yes, no cue required ?Recall: "Blue": Yes, no cue required ?Recall: "Bed": Yes, no cue required ?BIMS Summary Score: 15 ?Sensation ?Sensation ?Light  Touch: Appears Intact ?Proprioception: Appears Intact ?Coordination ?Gross Motor Movements are Fluid and Coordinated: No ?Fine Motor Movements are Fluid and Coordinated: No ?Coordination and Movement Description: generalized deconditioning with all mobility ?Motor  ?Motor ?Motor: Abnormal postural alignment and control;Other (comment) ?Motor - Discharge Observations: generalized deconditioning impacting balance and postural control and functional mobility (improved since admission) ?Mobility  ?Bed Mobility ?Rolling Right: Independent ?Rolling Left: Independent ?Supine to Sit: Independent ?Sit to Supine: Independent ?Transfers ?Sit to Stand: Supervision/Verbal cueing ?Stand to Sit: Supervision/Verbal cueing  ?Trunk/Postural Assessment  ?Cervical Assessment ?Cervical Assessment: Exceptions to [facility] (forward head) ?Thoracic Assessment ?Thoracic Assessment: Exceptions to [facility] (rounded shoulders) ?Lumbar Assessment ?Lumbar Assessment: Exceptions to [facility] (lumbar lordosis) ?Postural Control ?Postural Control: Deficits on evaluation (decreased/delayed)  ?Balance ?Balance ?Balance Assessed: Yes ?Standardized Balance Assessment ?Standardized Balance Assessment: Berg Balance Test ?Merrilee Jansky Balance Test ?Sit to Stand: Able to stand without using hands and stabilize independently ?Standing Unsupported: Able to stand 2 minutes with supervision ?Sitting with Back Unsupported but Feet Supported on Floor or Stool: Able to sit safely and securely 2 minutes ?Stand to Sit: Sits safely with minimal use of hands ?Transfers: Able to transfer safely, definite need of hands ?Standing Unsupported with Eyes Closed: Able to stand 10 seconds with supervision ?Standing Ubsupported with Feet Together: Needs help to attain position but able to stand for 30 seconds  with feet together ?From Standing, Reach Forward with Outstretched Arm: Reaches forward but needs supervision ?Static Sitting Balance ?Static Sitting - Balance Support: Feet supported;No upper extremity supported ?Static Sitting - Level of Assistance: 7: Independent ?Dynamic Sitting Balance ?Dynamic Sitting - Balance Support: Feet supported;No upper extremity supported ?Dynamic Sitting - Level of Assistance: 7: Independent ?Static Standing Balance ?Static Standing - Balance Support: During functional activity;No upper extremity supported ?Static Standing - Level of Assistance: 5: Stand by assistance ?Dynamic Standing Balance ?Dynamic Standing - Balance Support: During functional activity;Right upper extremity supported;Left upper extremity supported ?Dynamic Standing - Level of Assistance: 5: Stand by assistance ?Extremity/Trunk Assessment ?RUE Assessment ?RUE Assessment: Within Functional Limits ?General Strength Comments: generalized weakness and deconditioning ?LUE Assessment ?LUE Assessment: Within Functional Limits ?General Strength Comments: generalized weakness and deconditioning ? ? ?Cassidy Tran ?06/24/2021, 7:50 AM ?

## 2021-06-24 NOTE — Progress Notes (Signed)
Physical Therapy Session Note ? ?Patient Details  ?Name: Cassidy Tran ?MRN: [id number] ?Date of Birth: [birth year] ? ?Today's Date: 06/24/2021 ?PT Individual Time: 7628-3151 ?PT Individual Time Calculation (min): 42 min  ? ?Short Term Goals: ?Week 1:  PT Short Term Goal 1 (Week 1): Patient will perform basic transfers with CGA consistently using LRAD. ?PT Short Term Goal 2 (Week 1): Patient will ambulate >50 feet using LRAD maintaining O2 saturations >90% with <6L/min O2. ?PT Short Term Goal 3 (Week 1): Patient will initiate stair training. ?Week 2:    ? ?Skilled Therapeutic Interventions/Progress Updates:  ?Patient supine in bed on entrance to room. Parents present. Patient relates unrated pain in abdomen and is very fatigued as she did not sleep well last night. Otherwise is alert and agreeable to PT session.  ? ?Therapeutic Activity: ?Bed Mobility: Patient performed supine <> sit with supervision d/t pt's abdominal pain. No vc required for technique. ?Transfers: Patient performed sit<>stand and stand pivot transfers throughout session with supervision. Toilet transfer performed with supervision. Pt able to manage clothing and pericare with IND. With ambulatory transfer from toilet to bed, O2 doffed and pt checked on return to supine with reading at 90-93% on RA. O2 donned again for resting in bed.  ? ?Neuromuscular Re-ed: ?NMR facilitated during session with focus onstanding balance. Pt guided in completion of Berg balance test with increased score of 31/ 56 however still demonstrating a high risk for falls. Sensation tested with increased n/t on L compared to R, however increased n/t to anterior R lower leg, ankle and foot with pain associated with tingling sensation. MMT with decreased knee strength bilaterally and 1/5 for R DF, 3+/ 5 for R PF.  NMR performed for improvements in motor control and coordination, balance, sequencing, judgement, and self confidence/ efficacy in performing all aspects of mobility at  highest level of independence.  ? ?Patient supine  in bed at end of session with brakes locked, bed alarm set, and all needs within reach. Parents present in room. Related pt's request for Tylenol to RN.  ? ? ?Therapy Documentation ?Precautions:  ?Precautions ?Precautions: Fall ?Precaution Comments: PEG tube, 2L/min O2 with mobility ?Required Braces or Orthoses: Other Brace ?Other Brace: R post op shoe ?Restrictions ?Weight Bearing Restrictions: Yes ?RLE Weight Bearing: Weight bearing as tolerated ?General: ?  ?Vital Signs: ? ?Pain: ?Pain Assessment ?Pain Scale: 0-10 ?Pain Score: 3  ?Pain Type: Acute pain ?Pain Location: Abdomen ?Pain Descriptors / Indicators: Aching;Discomfort ? ?Therapy/Group: Individual Therapy ? ?Alger Simons PT, DPT ?06/24/2021, 12:40 PM  ?

## 2021-06-24 NOTE — Progress Notes (Signed)
Patient ID: Cassidy Tran, female   DOB: Jun 29, 1975, 46 y.o.   MRN: [id number] ? ?SW spoke with [facility] about outpatient PT/OT referral to [facility] (p: [phone]/[phone]) reporting they do not have OT only PT. SW will f/u after speaking with therapy team.  ? ?SW confirms not having OT is not a concern. SW spoke with [facility] to inform PT only is accepted by therapy team.  ? ?SW met with pt in room to discuss above. No questions/concerns reported. ? ?Loralee Pacas, MSW, LCSWA ?Office: [phone] ?Cell: [phone] ?Fax: [phone]  ?

## 2021-06-24 NOTE — Progress Notes (Signed)
No evidence of acute process of left foot  or ankle on X-rays.

## 2021-06-24 NOTE — Progress Notes (Addendum)
?                                                       PROGRESS NOTE ? ? ?Subjective/Complaints: ? ?Pt slept fairly well. Stomach still sore but bowels more formed,less frequent. Refusing questran and metamucil. Taking lomotil. Is able to eat a bit. Right foot better with cast shoe ? ?ROS: Patient denies fever, rash, sore throat, blurred vision, dizziness, nausea, vomiting, cough, shortness of breath or chest pain, joint or back/neck pain, headache, or mood change.  ? ? ?Objective: ?  ?DG Ankle Complete Right ? ?Result Date: 06/22/2021 ?CLINICAL DATA:  Increasing pain.  No injury. EXAM: RIGHT ANKLE - COMPLETE 3+ VIEW; RIGHT FOOT COMPLETE - 3+ VIEW COMPARISON:  None. FINDINGS: Right ankle: Mild distal medial malleolar degenerative spurring. The ankle mortise is symmetric and intact. Right foot: There is a subtle transverse lucency at the base of fifth metatarsal suggesting an acute to subacute nondisplaced fracture. Mild great toe metatarsophalangeal joint and second through fifth digit interphalangeal joint space narrowing degenerative change. No dislocation. IMPRESSION: Nondisplaced acute to subacute fracture of the base of fifth metatarsal. Electronically Signed   By: Yvonne Kendall M.D.   On: 06/22/2021 12:11  ? ?DG Foot Complete Right ? ?Result Date: 06/22/2021 ?CLINICAL DATA:  Increasing pain.  No injury. EXAM: RIGHT ANKLE - COMPLETE 3+ VIEW; RIGHT FOOT COMPLETE - 3+ VIEW COMPARISON:  None. FINDINGS: Right ankle: Mild distal medial malleolar degenerative spurring. The ankle mortise is symmetric and intact. Right foot: There is a subtle transverse lucency at the base of fifth metatarsal suggesting an acute to subacute nondisplaced fracture. Mild great toe metatarsophalangeal joint and second through fifth digit interphalangeal joint space narrowing degenerative change. No dislocation. IMPRESSION: Nondisplaced acute to subacute fracture of the base of fifth metatarsal. Electronically Signed   By: Yvonne Kendall  M.D.   On: 06/22/2021 12:11   ?Recent Labs  ?  06/22/21 ?7017  ?WBC 8.6  ?HGB 8.9*  ?HCT 28.9*  ?PLT 356  ? ? ?Recent Labs  ?  06/23/21 ?0602 06/24/21 ?0502  ?NA 137 134*  ?K 3.5 3.5  ?CL 98 100  ?CO2 28 26  ?GLUCOSE 105* 104*  ?BUN 7 7  ?CREATININE 0.67 0.65  ?CALCIUM 9.2 9.0  ? ? ?Intake/Output Summary (Last 24 hours) at 06/24/2021 1142 ?Last data filed at 06/23/2021 1813 ?Gross per 24 hour  ?Intake 600 ml  ?Output --  ?Net 600 ml  ?  ? ?  ? ?Physical Exam: ?Vital Signs ?Blood pressure (!) 118/55, pulse 84, temperature 98 ?F (36.7 ?C), temperature source Oral, resp. rate 18, height '5\' 3"'$  (1.6 m), weight 85.1 kg, last menstrual period 09/16/2012, SpO2 97 %. ? ?Constitutional: No distress . Vital signs reviewed. ?HEENT: NCAT, EOMI, oral membranes moist ?Neck: supple ?Cardiovascular: RRR without murmur. No JVD    ?Respiratory/Chest: CTA Bilaterally without wheezes or rales. Normal effort    ?GI/Abdomen: BS +, sl tender at PEG site. Area clean and dry ?Ext: no clubbing, cyanosis, or edema ?Psych: pleasant and cooperative  ?Musculoskeletal: FROM, Rt upper arm PICC removed on 4/21 ?Neurological: Alert and oriented x 4. 3/5 strength throughout. ?Skin: Warm and dry. ?Ext: no clubbing, cyanosis, or edema ?  ? ? ?Assessment/Plan: ?1. Functional deficits which require 3+ hours per day of interdisciplinary therapy in  a comprehensive inpatient rehab setting. ?Physiatrist is providing close team supervision and 24 hour management of active medical problems listed below. ?Physiatrist and rehab team continue to assess barriers to discharge/monitor patient progress toward functional and medical goals ? ?Care Tool: ? ?Bathing ? Bathing activity did not occur: Refused ?Body parts bathed by patient: Abdomen, Chest, Left arm, Right upper leg, Left upper leg, Face, Front perineal area, Buttocks, Right arm, Right lower leg, Left lower leg  ? Body parts bathed by helper: Front perineal area, Buttocks ?  ?  ?Bathing assist Assist Level: Set  up assist ?  ?  ?Upper Body Dressing/Undressing ?Upper body dressing   ?What is the patient wearing?: Pull over shirt ?   ?Upper body assist Assist Level: Set up assist ?   ?Lower Body Dressing/Undressing ?Lower body dressing ? ? ? Lower body dressing activity did not occur: Refused ?What is the patient wearing?: Pants ? ?  ? ?Lower body assist Assist for lower body dressing: Set up assist ?   ? ?Toileting ?Toileting    ?Toileting assist Assist for toileting: Set up assist ?  ?  ?Transfers ?Chair/bed transfer ? ?Transfers assist ?   ? ?Chair/bed transfer assist level: Supervision/Verbal cueing ?Chair/bed transfer assistive device: Walker ?  ?Locomotion ?Ambulation ? ? ?Ambulation assist ? ?   ? ?Assist level: Supervision/Verbal cueing ?Assistive device: Walker-rolling ?Max distance: >150 ft  ? ?Walk 10 feet activity ? ? ?Assist ?   ? ?Assist level: Supervision/Verbal cueing ?Assistive device: Walker-rolling  ? ?Walk 50 feet activity ? ? ?Assist Walk 50 feet with 2 turns activity did not occur: Safety/medical concerns ? ?Assist level: Supervision/Verbal cueing ?Assistive device: Walker-rolling  ? ? ?Walk 150 feet activity ? ? ?Assist Walk 150 feet activity did not occur: Safety/medical concerns ? ?Assist level: Supervision/Verbal cueing ?Assistive device: Walker-rolling ?  ? ?Walk 10 feet on uneven surface  ?activity ? ? ?Assist Walk 10 feet on uneven surfaces activity did not occur: Safety/medical concerns ? ? ?Assist level: Supervision/Verbal cueing ?Assistive device: Walker-rolling  ? ?Wheelchair ? ? ? ? ?Assist Is the patient using a wheelchair?: Yes ?Type of Wheelchair: Manual ?  ? ?Wheelchair assist level: Dependent - Patient 0% ?   ? ? ?Wheelchair 50 feet with 2 turns activity ? ? ? ?Assist ? ?  ?  ? ? ?Assist Level: Dependent - Patient 0%  ? ?Wheelchair 150 feet activity  ? ? ? ?Assist ?   ? ? ?Assist Level: Dependent - Patient 0%  ? ?Blood pressure (!) 118/55, pulse 84, temperature 98 ?F (36.7 ?C),  temperature source Oral, resp. rate 18, height '5\' 3"'$  (1.6 m), weight 85.1 kg, last menstrual period 09/16/2012, SpO2 97 %. ? ?Medical Problem List and Plan: ?1. Functional deficits secondary to operative complications including respiratory failure status post right hemicolectomy for neuroendocrine tumor. ?            -patient may shower ?            -ELOS/Goals: target dc date of 4/29        ?         -Continue CIR therapies including PT, OT. Reviewed plan for discharge with pt/ husband.  ? -needs f/u with gen surgery, pulmonary and oncology. Recommend ortho f/u for foot as well. Doesn't need to see [facility]  ?2.  Antithrombotics: ?-DVT/anticoagulation:  Pharmaceutical: Lovenox ?            -antiplatelet therapy: None ?3. Abdominal pain with IV  medication administration: Tylenol, oxycodone as needed.   ?-4/24 Continue to utilize oxycodone 5-10 mg q4 hrs as needed for abdominal pain ?4. Mood: LCSW to evaluate and provide emotional support ?            -- On scheduled Valium 4 mg 3 times daily for anxiety ?            -antipsychotic agents: n/a ?5. Neuropsych: This patient is capable of making decisions on her own behalf. ?6. Skin/Wound Care: Routine skin care checks ?7. Fluids/Electrolytes/Nutrition: Routine I's and O's and follow-up chemistries ?            -- Regular diet/thin liquids 4/17 ?            - ?8: Tracheostomy 4/3>>now in 4CL, capped 4/18 ?            -trach out, stoma closed. Can transition to bandaid.  ? 4/28 will go home on a liter of Oxygen it appears. Haven't been able to completely wean her off.  ?9: Volume overload:   ?Filed Weights  ? 06/22/21 0700 06/22/21 1002 06/23/21 0442  ?Weight: 85.1 kg 85.1 kg 85.1 kg  ?  -weights stable. Off lasix now. Seems fluid balanced ?11: ABLA: H and H low but stable; continue to monitor, follow-up CBC 4/25 ?12: Epilepsy>>no new seizure activity ?            --post-op encephalopathy likely due to Depakote, now on Keppra ?            --neuro rec:  zonasimide 100 mg  daily ?13: Chiari malformation s/p V-P shunt 2018 ?            --ID work-up this admission; negative L-P ?14: Von Willebrand disease: stable perioperative course ?15: Neuroendocrine tumor s/p right hemicolectomy: f

## 2021-06-24 NOTE — Progress Notes (Signed)
Late entry ? ?Pt's spouse educated on dressing change to peg site on 4/27 at 1800. Discussed OP follow up for peg removal. Provided split gauze for pt to take home. Pt and spouse expressed understanding.  ? ?Gerald Stabs, RN ? ?

## 2021-06-24 NOTE — Progress Notes (Signed)
Physical Therapy Session Note ? ?Patient Details  ?Name: Cassidy Tran ?MRN: [id number] ?Date of Birth: [birth year] ? ?Today's Date: 06/24/2021 ?PT Individual Time: 6389-3734 ?PT Individual Time Calculation (min): 24 min  ? ?Short Term Goals: ?Week 1:  PT Short Term Goal 1 (Week 1): Patient will perform basic transfers with CGA consistently using LRAD. ?PT Short Term Goal 2 (Week 1): Patient will ambulate >50 feet using LRAD maintaining O2 saturations >90% with <6L/min O2. ?PT Short Term Goal 3 (Week 1): Patient will initiate stair training. ? ?Skilled Therapeutic Interventions/Progress Updates:  ? Received pt semi-reclined in bed with father present at bedside on 1.5L O2 via [place] - transitioned to 2L on portable tank for session. Pt agreeable to PT treatment and reported pain in abdomen and in bilateral feet L>R (premedicated) but did not state pain level. Session with emphasis on discharge planning, functional mobility/transfers, generalized strengthening and endurance, and gait training. Pt transferred semi-reclined<>sitting EOB mod I and performed all transfers with RW and supervision/mod I. Pt ambulated 133f with RW and supervision through NM[employer] Technologywith total A to manage O2 tank while talking with CSW. Upon returning to room O2 sats dropped to 84% but quickly recovered to 93% when pt stopped talking and with pursed lip breathing. Sit<>supine mod I and concluded session with pt semi-reclined in bed, needs within reach, and bed alarm on. Pt left on 1.5L O2 via [place] with O2 sats 93%.  ? ?Therapy Documentation ?Precautions:  ?Precautions ?Precautions: Fall ?Precaution Comments: PEG tube, 2L/min O2 with mobility ?Required Braces or Orthoses: Other Brace ?Other Brace: R post op shoe ?Restrictions ?Weight Bearing Restrictions: Yes ?RLE Weight Bearing: Weight bearing as tolerated ? ?Therapy/Group: Individual Therapy ?ABlenda Nicely?ABecky SaxPT, DPT  ?06/24/2021, 7:49 AM  ?

## 2021-06-24 NOTE — Progress Notes (Addendum)
Occupational Therapy Session Note ? ?Patient Details  ?Name: Cassidy Tran ?MRN: [id number] ?Date of Birth: 02-11-1976 ? ?Today's Date: 06/24/2021 ?OT Individual Time: 4098-1191 ?OT Individual Time Calculation (min): 45 min  ? ? ?Short Term Goals: ?Week 1:  OT Short Term Goal 1 (Week 1): Pt will transfer to toilet with supervision and O2 remain >90% on supp O2 to dmeo improved hemodynamic stability ?OT Short Term Goal 2 (Week 1): Pt will complete 2/3 steps of toileting ?OT Short Term Goal 3 (Week 1): Pt will bathe at shower level with no more than min A ?OT Short Term Goal 4 (Week 1): Pt will need no more than min cuing for breathing strategies during funcitonal transfers to improve recall during transitional movements ?  ? ?Skilled Therapeutic Interventions/Progress Updates:  ?  Subjective: Pt states 6/10 pain in stomach "where tube is" and bilateral feet. Nurse made aware of pts request for pain medicine.  Pt requesting to use bathroom during this session. ?  Objective:  Pt semi reclined in bed. Supine to sit with supervision. Ambulation with RW with supervision and assist with O2 tube management.  Toilet transfer and toileting with distant supervision.  Ambulated back to EOB with RW needing same amount of assist.  Vitals assessed sitting EOB at rest: BP 117/47, SPO2 on 2L via [place] 99%, pulse 102 bpm;  SPO2 assessed again without supplemental O2 and SPO2 dropped to 90% with cues for  pursed lip breathing. Pt reports pain increasing and requesting back to bed due to having another PT session in 15 minutes.  Sit to supine with supervision. Call bell in reach, seat alarm on.   ? ? ?Therapy Documentation ?Precautions:  ?Precautions ?Precautions: Fall ?Precaution Comments: PEG tube, 2L/min O2 with mobility ?Required Braces or Orthoses: Other Brace ?Other Brace: R post op shoe ?Restrictions ?Weight Bearing Restrictions: Yes ?RLE Weight Bearing: Weight bearing as tolerated ? ? ?Therapy/Group: Individual Therapy ? ?Cassidy Asp  Lazarus Tran ?06/24/2021, 2:57 PM ?

## 2021-06-25 NOTE — Progress Notes (Signed)
Patient discharged to home at 0930. Medications provided to patient that were stored at inpatient pharmacy. Patient and husband state that discharge instructions were reviewed prior and decline further question or concerns at this time. All personal belongings collected, including oxygen and tubing provided to patient for home.  ?

## 2021-06-27 NOTE — Progress Notes (Signed)
Inpatient Rehabilitation Care Coordinator ?Discharge Note  ? ?Patient Details  ?Name: Cassidy Tran ?MRN: [id number] ?Date of Birth: 03-10-1975 ? ? ?Discharge location: D/c to home ? ?Length of Stay: 9 days ? ?Discharge activity level: Supervision ? ?Home/community participation: Limited ? ?Patient response FB:PZWCHE Literacy - How often do you need to have someone help you when you read instructions, pamphlets, or other written material from your doctor or pharmacy?: Never ? ?Patient response NI:DPOEUM Isolation - How often do you feel lonely or isolated from those around you?: Never ? ?Services provided included: MD, RD, PT, OT, SLP, RN, CM, Pharmacy, SW, Neuropsych, TR ? ?Financial Services:  ?Charity fundraiser Utilized: Private Insurance ?[employer] ? ?Choices offered to/list presented to: Yes ? ?Follow-up services arranged:  ?Outpatient, DME ?   ?Outpatient Servicies: [facility] for PT ?DME : [place] for TTB, RW, and home o2 ?  ? ?Patient response to transportation need: ?Is the patient able to respond to transportation needs?: Yes ?In the past 12 months, has lack of transportation kept you from medical appointments or from getting medications?: No ?In the past 12 months, has lack of transportation kept you from meetings, work, or from getting things needed for daily living?: No ? ? ?Comments (or additional information): ? ?Patient/Family verbalized understanding of follow-up arrangements:  Yes ? ?Individual responsible for coordination of the follow-up plan: contact pt or pt husband Cassidy Tran ? ?Confirmed correct DME delivered: Cassidy Tran 06/27/2021   ? ?Cassidy Tran ?

## 2021-07-04 ENCOUNTER — Encounter: Payer: Self-pay | Admitting: Adult Health

## 2021-07-04 ENCOUNTER — Ambulatory Visit (INDEPENDENT_AMBULATORY_CARE_PROVIDER_SITE_OTHER): Payer: No Typology Code available for payment source | Admitting: Adult Health

## 2021-07-04 ENCOUNTER — Ambulatory Visit (INDEPENDENT_AMBULATORY_CARE_PROVIDER_SITE_OTHER): Payer: No Typology Code available for payment source

## 2021-07-04 VITALS — BP 110/70 | HR 92 | Temp 98.0°F | Ht 63.0 in | Wt 182.0 lb

## 2021-07-04 DIAGNOSIS — J1282 Pneumonia due to coronavirus disease 2019: Secondary | ICD-10-CM | POA: Diagnosis not present

## 2021-07-04 DIAGNOSIS — C7A8 Other malignant neuroendocrine tumors: Secondary | ICD-10-CM

## 2021-07-04 DIAGNOSIS — U071 COVID-19: Secondary | ICD-10-CM

## 2021-07-04 DIAGNOSIS — J189 Pneumonia, unspecified organism: Secondary | ICD-10-CM | POA: Diagnosis not present

## 2021-07-04 DIAGNOSIS — J9600 Acute respiratory failure, unspecified whether with hypoxia or hypercapnia: Secondary | ICD-10-CM | POA: Diagnosis not present

## 2021-07-04 DIAGNOSIS — J4540 Moderate persistent asthma, uncomplicated: Secondary | ICD-10-CM

## 2021-07-04 NOTE — Assessment & Plan Note (Signed)
Patient had postop HCAP.  Clinically improved.  We will check chest x-ray today. ?

## 2021-07-04 NOTE — Assessment & Plan Note (Signed)
Prolonged critical illness after recent postop complications with HCAP, respiratory failure.  Patient was treated with empiric antibiotics.  Required vent support.  Patient was treated to maximize bronchodilation.  Prior to admission asthma was well controlled with only albuterol use.  Patient will continue on current maintenance regimen.  We will check PFTs on return. ?Patient is no longer smoking. ? ?Plan  ?Patient Instructions  ?Continue on Pulmicort nebulizer twice daily ?Continue on Stiolto 2 puffs daily ?Mucinex DM twice daily as needed for cough and congestion ?Continue on oxygen 1 L to maintain O2 saturations greater than 88 to 90% ?Chest x-ray today ?Follow-up in 6 weeks with Dr. Verlee Monte or Apryll Hinkle NP , with PFT and As needed  (30 min visit )  ?Please contact office for sooner follow up if symptoms do not improve or worsen or seek emergency care  ? ?  ? ?

## 2021-07-04 NOTE — Patient Instructions (Addendum)
Continue on Pulmicort nebulizer twice daily ?Continue on Stiolto 2 puffs daily ?Mucinex DM twice daily as needed for cough and congestion ?Continue on oxygen 1 L to maintain O2 saturations greater than 88 to 90% ?Chest x-ray today ?Follow-up in 6 weeks with Dr. Verlee Monte or Brealynn Contino NP , with PFT and As needed  (30 min visit )  ?Please contact office for sooner follow up if symptoms do not improve or worsen or seek emergency care  ? ?

## 2021-07-04 NOTE — Assessment & Plan Note (Signed)
Status post abdominal surgery with cholecystectomy and colon resection for neuroendocrine tumor.  Patient is to follow-up with general surgeon as planned later this week.  Continue with wound care. ?

## 2021-07-04 NOTE — Progress Notes (Signed)
? ?'@Patient'$  ID: Cassidy Tran, female    DOB: January 25, 1976, 46 y.o.   MRN: [id number] ? ?Chief Complaint  ?Patient presents with  ? Hospitalization Follow-up  ? ? ?Referring provider: ?Janie Morning, DO ? ?HPI: ?58 former smoker seen for pulmonary consult during hospital stay for critical illness.  Patient was admitted May 05, 2021 for a planned cholecystectomy with right hemicolectomy for neuroendocrine tumor.  Patient's postop course was complicated by HCAP with hypoxia and respiratory failure.  She required intubation and vent support.  She also had acute encephalopathy secondary to high ammonia levels from seizure medications.  Patient has an extensive history with asthma, seizure disorder von Willebrand disease and Chiari malformation status post VPS.  Patient did have prolonged vent support with trach and G-tube.  She was decannulated June 14, 2021. ? ?TEST/EVENTS :  ? ?07/04/2021 Follow up : Respiratory failure, pneumonia, asthma, ARDS ?Patient returns for a post hospital follow-up.  Patient was recently discharged from the hospital on April 29 from a prolonged hospitalization from critical illness.  She was admitted to the hospital May 05, 2021 for a planned cholecystectomy with right hemicolectomy for neuroendocrine tumor.  Postop course was complicated by HCAP with hypoxemia and respiratory failure.  She required intubation and vent support.  She had acute encephalopathy secondary to high ammonia levels from seizure medications.  She required tracheostomy and G-tube.  CT chest showed extensive groundglass opacities with septal thickening.   She was treated with empiric antibiotics.  Patient had a slow recovery.  Was eventually decannulated June 14, 2021.  Discharged to inpatient rehab.  Has been doing extensive physical therapy during rehab center.  She was discharged home on April 29.  She was discharged on oxygen 1 L.  She is starting outpatient physical therapy on Jul 18, 2021.  She does use a walker.   She did have a right foot fracture during hospitalization due to a fall.  Patient was a smoker prior to admission.  Has not smoked since discharge.  Prior to admission patient was not on any oxygen.  Use albuterol as needed for asthma.  Patient has scheduled G-tube removal later this week.  Patient says she is eating and drinking well.  No nausea vomiting or diarrhea.  She does have a follow-up with her general surgeon this week for her abdominal surgery.  She says that she is continues to put daily dressings that her surgical site is not completely healed.  Overall she says she is starting to do better get stronger.  But gets winded and low energy easily. ?She denies any increased cough congestion fever.  She remains on Pulmicort nebulizer twice daily.  Stiolto daily.  ? ? ? ? ?Allergies  ?Allergen Reactions  ? Aspirin Other (See Comments)  ?  Childhood reaction  ? Codeine Nausea And Vomiting  ?  Tolerates Hydrocodone  ? Erythromycin Other (See Comments)  ?  Childhood reaction, tolerate Zpak ?Childhood reaction does not know.  ? Lactose Intolerance (Gi) Diarrhea  ? Nsaids Nausea And Vomiting  ? Penicillins Other (See Comments)  ?  Has patient had a PCN reaction causing immediate rash, facial/tongue/throat swelling, SOB or lightheadedness with hypotension: doesn't remember childhood Reaction  ?Has patient had a PCN reaction causing severe rash involving mucus membranes or skin necrosis: NO ?Has patient had a PCN reaction that required hospitalization NO ?Has patient had a PCN reaction occurring within the last 10 years: NO ?If all of the above answers are "NO", then may proceed with  Cephalosporin use. ?  ? ? ?Immunization History  ?Administered Date(s) Administered  ? Influenza, Quadrivalent, Recombinant, Inj, Pf 11/17/2020  ? Moderna Sars-Covid-2 Vaccination 05/06/2019, 06/03/2019, 12/19/2019, 07/17/2020  ? ? ?Past Medical History:  ?Diagnosis Date  ? Acid reflux   ? Allergy   ? Anxiety   ? Arthritis   ? lower  back  ? ASCUS (atypical squamous cells of undetermined significance) on Pap smear 03/2011  ? NEG HR HPV  ? Asthma   ? Cancer [facility])   ? skin cancer- age 42ish  ? Cervical dysplasia, mild 08/2010  ? LGSIL colposcopy biopsy showing koilocytotic atypia  ? Clotting disorder ([place])   ? Depression   ? Endometriosis   ? Headache(784.0)   ? High risk HPV infection 12/2011  ? Pap normal  ? Hypertension   ? Pneumonia   ? 2016ish  ? Seizures ([place])   ? seizure disorder  ? Smoker   ? Von Willebrand disease ([place])   ? ? ?Tobacco History: ?Social History  ? ?Tobacco Use  ?Smoking Status Former  ? Packs/day: 0.50  ? Years: 20.00  ? Pack years: 10.00  ? Types: Cigarettes  ? Quit date: 05/05/2021  ? Years since quitting: 0.1  ?Smokeless Tobacco Never  ? ?Counseling given: Not Answered ? ? ?Outpatient Medications Prior to Visit  ?Medication Sig Dispense Refill  ? acetaminophen (TYLENOL) 325 MG tablet Take 1-2 tablets (325-650 mg total) by mouth every 4 (four) hours as needed for mild pain.    ? atorvastatin (LIPITOR) 20 MG tablet Take 1 tablet (20 mg total) by mouth daily. 30 tablet 0  ? budesonide (PULMICORT) 0.5 MG/2ML nebulizer solution Take 1 mL (0.25 mg total) by nebulization 2 (two) times daily. 60 mL 0  ? carvedilol (COREG) 3.125 MG tablet Take 1 tablet (3.125 mg total) by mouth 2 (two) times daily with a meal. 60 tablet 0  ? diazepam (VALIUM) 2 MG tablet Take 1 tablet (2 mg total) by mouth every 12 (twelve) hours as needed for anxiety. 15 tablet 0  ? diphenoxylate-atropine (LOMOTIL) 2.5-0.025 MG tablet Take 2 tablets by mouth 4 (four) times daily. (Patient taking differently: Take 2 tablets by mouth 4 (four) times daily as needed.) 30 tablet 0  ? mometasone (NASONEX) 50 MCG/ACT nasal spray Use one to two sprays in each nostril once daily (Patient taking differently: Place 1-2 sprays into the nose daily as needed (allergies).) 17 g 5  ? montelukast (SINGULAIR) 10 MG tablet TAKE 1 TABLET BY MOUTH AT BEDTIME 30 tablet 0  ? Multiple  Vitamin (MULTIVITAMIN WITH MINERALS) TABS tablet Take 1 tablet by mouth daily.    ? oxyCODONE (OXY IR/ROXICODONE) 5 MG immediate release tablet Take 1-2 tablets (5-10 mg total) by mouth every 6 (six) hours as needed for moderate pain or severe pain ('5mg'$  for moderate pain, '10mg'$  for severe pain). 20 tablet 0  ? pantoprazole (PROTONIX) 40 MG tablet Take 1 tablet (40 mg total) by mouth daily. 30 tablet 1  ? potassium chloride SA (KLOR-CON M) 20 MEQ tablet Take 1 tablet (20 mEq total) by mouth 2 (two) times daily. 14 tablet 0  ? psyllium (HYDROCIL/METAMUCIL) 95 % PACK Take 1 packet by mouth 2 (two) times daily. 240 each   ? saccharomyces boulardii (FLORASTOR) 250 MG capsule Take 1 capsule (250 mg total) by mouth 2 (two) times daily. 60 capsule 0  ? Tiotropium Bromide-Olodaterol (STIOLTO RESPIMAT) 2.5-2.5 MCG/ACT AERS Inhale 2 puffs into the lungs daily. 4 g 0  ?  tretinoin (RETIN-A) 0.05 % cream Apply 1 application topically at bedtime as needed (acne).    ? zonisamide (ZONEGRAN) 100 MG capsule Take 1 capsule (100 mg total) by mouth at bedtime. 30 capsule 0  ? ?No facility-administered medications prior to visit.  ? ? ? ?Review of Systems:  ? ?Constitutional:   No  weight loss, night sweats,  Fevers, chills,  ?+fatigue, or  lassitude. ? ?HEENT:   No headaches,  Difficulty swallowing,  Tooth/dental problems, or  Sore throat,  ?              No sneezing, itching, ear ache, nasal congestion, post nasal drip,  ? ?CV:  No chest pain,  Orthopnea, PND, swelling in lower extremities, anasarca, dizziness, palpitations, syncope.  ? ?GI  No heartburn, indigestion, abdominal pain, nausea, vomiting, diarrhea, change in bowel habits, loss of appetite, bloody stools.  ? ?Resp:   No chest wall deformity ? ?Skin: no rash or lesions. ? ?GU: no dysuria, change in color of urine, no urgency or frequency.  No flank pain, no hematuria  ? ?MS:  No joint pain or swelling.  No decreased range of motion.  No back pain. ? ? ? ?Physical Exam ? ?BP  110/70 (BP Location: Left Arm, Patient Position: Sitting, Cuff Size: Large)   Pulse 92   Temp 98 ?F (36.7 ?C) (Oral)   Ht '5\' 3"'$  (1.6 m)   Wt 182 lb (82.6 kg)   LMP 09/16/2012   SpO2 95%   BMI 32.24 kg/m?

## 2021-07-04 NOTE — Assessment & Plan Note (Signed)
Patient had a slow to resolve chronic respiratory failure after critical illness with prolonged hospitalization over the last 2 months.  Patient did had HCAP and was on the ventilator requiring tracheostomy. - ?Patient has now been decannulated, requiring oxygen at 1 L.  Patient is to maintain oxygen to keep O2 saturations greater than 88 to 90%. ? ?Plan   ?Patient Instructions  ?Continue on Pulmicort nebulizer twice daily ?Continue on Stiolto 2 puffs daily ?Mucinex DM twice daily as needed for cough and congestion ?Continue on oxygen 1 L to maintain O2 saturations greater than 88 to 90% ?Chest x-ray today ?Follow-up in 6 weeks with Dr. Verlee Monte or Shirline Kendle NP , with PFT and As needed  (30 min visit )  ?Please contact office for sooner follow up if symptoms do not improve or worsen or seek emergency care  ? ?  ? ?

## 2021-07-05 NOTE — Progress Notes (Signed)
Called and spoke with patient, provided results/recommendations per Tammy Parrett NP.  She verbalized understanding.  Nothing further needed.

## 2021-07-06 ENCOUNTER — Encounter: Payer: Self-pay | Admitting: Neurology

## 2021-07-07 ENCOUNTER — Other Ambulatory Visit (HOSPITAL_COMMUNITY): Payer: Self-pay

## 2021-07-12 ENCOUNTER — Telehealth: Payer: Self-pay | Admitting: Gastroenterology

## 2021-07-12 NOTE — Telephone Encounter (Signed)
We received a call from patient seeking advise from Dr. Bryan Lemma. Per patient, still having diarrhea. What does doctor advise I take/do? Please advise. ?

## 2021-07-13 ENCOUNTER — Other Ambulatory Visit: Payer: Self-pay

## 2021-07-13 MED ORDER — CHOLESTYRAMINE 4 G PO PACK: 2.0000 g | PACK | Freq: Every day | ORAL | 0 refills | Status: DC

## 2021-07-13 NOTE — Telephone Encounter (Signed)
Has she tried using cholestyramine?  If not, start cholestyramine 2 g/day.  If no appreciable improvement in 5-7 days, increase to 2 g twice daily and can continue up titrating from there.  Okay to continue Imodium and Lomotil.  Would also discussed with her surgeon to see if there are other medication options. ?

## 2021-07-13 NOTE — Telephone Encounter (Signed)
Pt reports that she has continued to have watery diarrhea up to 15 times per day. Pt states she is currently taking imodium and lomotil which helps sometimes but she continues to have diarrhea. Pt denies any associated bleeding. Pt states she has abd pain from recent surgery to have her gallbladder and appendix out. Asked pt if she had reached out to [facility] regarding diarrhea and she stated she saw them last week. On appt note it says "Add metamucil (fiber) one packet daily for two weeks. If still no improvement in bowel movements, increase to two packets daily (can take one in the morning and one at night or can take both at the same time)" asked pt if she is taking the metamucil and she stated that it didn't help so she is currently not taking it.  Pt saw [facility] on 5/11 for PEG removal after open R hemicolectomy and cholecystectomy on 05/05/21. Pt also scheduled for f/u with Dr. Marin Olp 08/01/21 to discuss chemo options for +LN on path. Please advise.  ?

## 2021-07-13 NOTE — Telephone Encounter (Signed)
Spoke with pt and gave pt recommendations. Prescription sent to pt's pharmacy. Pt verbalized understanding.   ?

## 2021-07-13 NOTE — Telephone Encounter (Signed)
Attempted to call pt but was unable to leave voicemail.  ?

## 2021-07-16 LAB — ACID FAST CULTURE WITH REFLEXED SENSITIVITIES (MYCOBACTERIA): Acid Fast Culture: NEGATIVE

## 2021-08-01 ENCOUNTER — Inpatient Hospital Stay (HOSPITAL_BASED_OUTPATIENT_CLINIC_OR_DEPARTMENT_OTHER): Payer: No Typology Code available for payment source | Admitting: Hematology & Oncology

## 2021-08-01 ENCOUNTER — Telehealth: Payer: Self-pay | Admitting: *Deleted

## 2021-08-01 ENCOUNTER — Encounter: Payer: Self-pay | Admitting: Hematology & Oncology

## 2021-08-01 ENCOUNTER — Other Ambulatory Visit: Payer: Self-pay | Admitting: *Deleted

## 2021-08-01 ENCOUNTER — Inpatient Hospital Stay: Payer: No Typology Code available for payment source | Attending: Hematology & Oncology

## 2021-08-01 ENCOUNTER — Telehealth: Payer: Self-pay

## 2021-08-01 ENCOUNTER — Inpatient Hospital Stay: Payer: No Typology Code available for payment source

## 2021-08-01 VITALS — BP 146/87 | HR 76 | Temp 98.0°F | Resp 20 | Ht 63.0 in | Wt 173.0 lb

## 2021-08-01 DIAGNOSIS — K591 Functional diarrhea: Secondary | ICD-10-CM

## 2021-08-01 DIAGNOSIS — C7A010 Malignant carcinoid tumor of the duodenum: Secondary | ICD-10-CM | POA: Insufficient documentation

## 2021-08-01 DIAGNOSIS — D6800 Von Willebrand disease, unspecified: Secondary | ICD-10-CM

## 2021-08-01 DIAGNOSIS — R197 Diarrhea, unspecified: Secondary | ICD-10-CM | POA: Insufficient documentation

## 2021-08-01 DIAGNOSIS — C7A012 Malignant carcinoid tumor of the ileum: Secondary | ICD-10-CM | POA: Diagnosis not present

## 2021-08-01 DIAGNOSIS — Z79899 Other long term (current) drug therapy: Secondary | ICD-10-CM | POA: Diagnosis not present

## 2021-08-01 DIAGNOSIS — D6801 Von willebrand disease, type 1: Secondary | ICD-10-CM

## 2021-08-01 DIAGNOSIS — C7A8 Other malignant neuroendocrine tumors: Secondary | ICD-10-CM

## 2021-08-01 LAB — CMP (CANCER CENTER ONLY)
ALT: 34 U/L (ref 0–44)
AST: 31 U/L (ref 15–41)
Albumin: 4.1 g/dL (ref 3.5–5.0)
Alkaline Phosphatase: 138 U/L — ABNORMAL HIGH (ref 38–126)
Anion gap: 14 (ref 5–15)
BUN: 5 mg/dL — ABNORMAL LOW (ref 6–20)
CO2: 33 mmol/L — ABNORMAL HIGH (ref 22–32)
Calcium: 8.2 mg/dL — ABNORMAL LOW (ref 8.9–10.3)
Chloride: 98 mmol/L (ref 98–111)
Creatinine: 0.73 mg/dL (ref 0.44–1.00)
GFR, Estimated: >60 mL/min (ref >60)
Glucose, Bld: 118 mg/dL — ABNORMAL HIGH (ref 70–99)
Potassium: 2 mmol/L — CL (ref 3.5–5.1)
Sodium: 145 mmol/L (ref 135–145)
Total Bilirubin: 1 mg/dL (ref 0.3–1.2)
Total Protein: 7.1 g/dL (ref 6.5–8.1)

## 2021-08-01 LAB — CBC WITH DIFFERENTIAL (CANCER CENTER ONLY)
Abs Immature Granulocytes: 0.04 10*3/uL (ref 0.00–0.07)
Basophils Absolute: 0.1 10*3/uL (ref 0.0–0.1)
Basophils Relative: 0 %
Eosinophils Absolute: 0.2 10*3/uL (ref 0.0–0.5)
Eosinophils Relative: 2 %
HCT: 37.5 % (ref 36.0–46.0)
Hemoglobin: 12.1 g/dL (ref 12.0–15.0)
Immature Granulocytes: 0 %
Lymphocytes Relative: 31 %
Lymphs Abs: 3.5 10*3/uL (ref 0.7–4.0)
MCH: 26.8 pg (ref 26.0–34.0)
MCHC: 32.3 g/dL (ref 30.0–36.0)
MCV: 83 fL (ref 80.0–100.0)
Monocytes Absolute: 0.7 10*3/uL (ref 0.1–1.0)
Monocytes Relative: 7 %
Neutro Abs: 6.7 10*3/uL (ref 1.7–7.7)
Neutrophils Relative %: 60 %
Platelet Count: 385 10*3/uL (ref 150–400)
RBC: 4.52 MIL/uL (ref 3.87–5.11)
RDW: 14.9 % (ref 11.5–15.5)
WBC Count: 11.2 10*3/uL — ABNORMAL HIGH (ref 4.0–10.5)
nRBC: 0 % (ref 0.0–0.2)

## 2021-08-01 LAB — MAGNESIUM: Magnesium: 1.2 mg/dL — ABNORMAL LOW (ref 1.7–2.4)

## 2021-08-01 LAB — APTT: aPTT: 28 seconds (ref 24–36)

## 2021-08-01 MED ORDER — POTASSIUM CHLORIDE CRYS ER 20 MEQ PO TBCR: 40.0000 meq | EXTENDED_RELEASE_TABLET | Freq: Two times a day (BID) | ORAL | 2 refills | Status: DC

## 2021-08-01 MED ORDER — PANCRELIPASE (LIP-PROT-AMYL) 36000-114000 UNITS PO CPEP: ORAL_CAPSULE | ORAL | 11 refills | Status: DC

## 2021-08-01 NOTE — Telephone Encounter (Signed)
Per Dr. Antonieta Pert request pt called and notified of magnesium results. Pt aware of need for an appointment for IV magnesium. Appointment made while on phone with patient. Pt stated she did pick up her potassium pills for her low potassium level. Pt reminded that if she has any heart palpitations to seek emergency care. Pt verbalized understanding and had no further questions.

## 2021-08-01 NOTE — Telephone Encounter (Signed)
Dr. Marin Olp notified of K-2.0.  No new orders received at this time.

## 2021-08-01 NOTE — Progress Notes (Signed)
Hematology and Oncology Follow Up Visit  Cassidy Tran [id number] May 31, 1975 46 y.o. 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  We had always seen her in the past because of her von Willebrand disease.  She subsequently had surgery back on 05/05/2021 for a carcinoid.  She had an incredible amount of complications from this.  Thankfully nothing was of bleeding.  She did have part of her ileum removed and part of her colon removed.  She had a gallbladder taken out.  She had problems afterwards.  She required a trach.  She was hospitalized for I think almost 6-7 weeks.  She then had to go to rehab.  The pathology on the carcinoid ([id number]) showed a 1.5 x 1 x 0.9 cm well differentiated neuroendocrine tumor.  7 lymph nodes were removed and one did contain malignancy.  As such, she is a stage III neuroendocrine carcinoma.  From what Cassidy Tran and her's husband say was that the surgeon told her that there were other carcinoid tumors that she cannot take out.  Her problem now is incredible diarrhea.  She states she goes 20 times a day.  She is seeing gastroenterology.  Apparently what they are doing is not really helping.  I cannot imagine that this diarrhea is from carcinoid syndrome.  She does not have any disease in her liver.  We will have to see what the Chromogranin A level is.  I think we would be a good idea of to do a nuclear medicine scan to see about whether or not there is actual carcinoids that are functional.  She has lost quite a bit of weight.  Again she has a lot of diarrhea which I really hate.  She has had no problems with pain wise.  She has had no urinary difficulties.  She has had no wheezing.  There has been no cough.  She has had no rashes.  There  has been no bleeding.  The his been no leg swelling.  She has had no headache.  Overall, I would have said that her performance status is probably ECOG 1.  Medications:  Current Outpatient Medications:    acetaminophen (TYLENOL) 325 MG tablet, Take 1-2 tablets (325-650 mg total) by mouth every 4 (four) hours as needed for mild pain., Disp: , Rfl:    atorvastatin (LIPITOR) 20 MG tablet, Take 1 tablet (20 mg total) by mouth daily., Disp: 30 tablet, Rfl: 0   budesonide (PULMICORT) 0.5 MG/2ML nebulizer solution, Take 1 mL (0.25 mg total) by nebulization 2 (two) times daily., Disp: 60 mL, Rfl: 0   carvedilol (COREG) 3.125 MG tablet, Take 1 tablet (3.125 mg total) by mouth 2 (two) times daily with a meal., Disp: 60 tablet, Rfl: 0   cholestyramine (QUESTRAN) 4 g packet, Take 0.5 packets (2 g total) by mouth daily., Disp: 30 each, Rfl: 0   diphenoxylate-atropine (LOMOTIL) 2.5-0.025 MG tablet, Take 2 tablets by mouth 4 (four) times daily. (Patient taking differently: Take 2 tablets by mouth 4 (four) times daily as needed.), Disp: 30 tablet, Rfl: 0   lipase/protease/amylase (CREON) 36000 UNITS CPEP capsule, Take 2 capsules (72,000 Units total) by mouth 3 (three) times daily with meals. May also take 1 capsule (36,000 Units total) as needed (with snacks)., Disp: 240 capsule, Rfl:  11   loperamide (IMODIUM A-D) 2 MG tablet, Take 2 mg by mouth 4 (four) times daily as needed for diarrhea or loose stools., Disp: , Rfl:    methocarbamol (ROBAXIN) 750 MG tablet, Take 750 mg by mouth every 6 (six) hours as needed., Disp: , Rfl:    montelukast (SINGULAIR) 10 MG tablet, TAKE 1 TABLET BY MOUTH AT BEDTIME, Disp: 30 tablet, Rfl: 0   Multiple Vitamin (MULTIVITAMIN WITH MINERALS) TABS tablet, Take 1 tablet by mouth daily., Disp: , Rfl:    pantoprazole (PROTONIX) 40 MG tablet, Take 1 tablet (40 mg total) by mouth daily., Disp: 30 tablet, Rfl: 1   potassium chloride SA (KLOR-CON M) 20 MEQ tablet, Take 1 tablet (20 mEq total)  by mouth 2 (two) times daily., Disp: 14 tablet, Rfl: 0   potassium chloride SA (KLOR-CON M) 20 MEQ tablet, Take 2 tablets (40 mEq total) by mouth 2 (two) times daily. Take 2 tablets twice a day for 7 days and then take 1 tablet twice a day, Disp: 120 tablet, Rfl: 2   Tiotropium Bromide-Olodaterol (STIOLTO RESPIMAT) 2.5-2.5 MCG/ACT AERS, Inhale 2 puffs into the lungs daily., Disp: 4 g, Rfl: 0   zonisamide (ZONEGRAN) 100 MG capsule, Take 1 capsule (100 mg total) by mouth at bedtime., Disp: 30 capsule, Rfl: 0   albuterol (VENTOLIN HFA) 108 (90 Base) MCG/ACT inhaler, Inhale into the lungs. (Patient not taking: Reported on 08/01/2021), Disp: , Rfl:    diazepam (VALIUM) 2 MG tablet, Take 1 tablet (2 mg total) by mouth every 12 (twelve) hours as needed for anxiety. (Patient not taking: Reported on 08/01/2021), Disp: 15 tablet, Rfl: 0   gabapentin (NEURONTIN) 100 MG capsule, Take 100 mg by mouth 3 (three) times daily. (Patient not taking: Reported on 08/01/2021), Disp: , Rfl:    mometasone (NASONEX) 50 MCG/ACT nasal spray, Use one to two sprays in each nostril once daily (Patient not taking: Reported on 08/01/2021), Disp: 17 g, Rfl: 5   oxyCODONE (OXY IR/ROXICODONE) 5 MG immediate release tablet, Take 1-2 tablets (5-10 mg total) by mouth every 6 (six) hours as needed for moderate pain or severe pain ('5mg'$  for moderate pain, '10mg'$  for severe pain). (Patient not taking: Reported on 08/01/2021), Disp: 20 tablet, Rfl: 0   psyllium (HYDROCIL/METAMUCIL) 95 % PACK, Take 1 packet by mouth 2 (two) times daily. (Patient not taking: Reported on 08/01/2021), Disp: 240 each, Rfl:    tretinoin (RETIN-A) 0.05 % cream, Apply 1 application topically at bedtime as needed (acne). (Patient not taking: Reported on 08/01/2021), Disp: , Rfl:   Allergies:  Allergies  Allergen Reactions   Aspirin Other (See Comments)    Childhood reaction   Codeine Nausea And Vomiting    Tolerates Hydrocodone   Erythromycin Other (See Comments)    Childhood  reaction, tolerate Zpak Childhood reaction does not know.   Lactose Intolerance (Gi) Diarrhea   Nsaids Nausea And Vomiting   Penicillins Other (See Comments)    Has patient had a PCN reaction causing immediate rash, facial/tongue/throat swelling, SOB or lightheadedness with hypotension: doesn't remember childhood Reaction  Has patient had a PCN reaction causing severe rash involving mucus membranes or skin necrosis: NO Has patient had a PCN reaction that required hospitalization NO Has patient had a PCN reaction occurring within the last 10 years: NO If all of the above answers are "NO", then may proceed with Cephalosporin use.     Past Medical History, Surgical history, Social history, and Family History were reviewed and  updated.  Review of Systems: Review of Systems  Constitutional:  Positive for fatigue and unexpected weight change.  HENT:  Negative.    Eyes: Negative.   Respiratory: Negative.    Cardiovascular: Negative.   Gastrointestinal:  Positive for diarrhea.  Endocrine: Negative.   Genitourinary: Negative.    Musculoskeletal: Negative.   Skin: Negative.   Neurological: Negative.   Hematological: Negative.   Psychiatric/Behavioral: Negative.     Physical Exam:  height is '5\' 3"'$  (1.6 m) and weight is 173 lb (78.5 kg). Her oral temperature is 98 F (36.7 C). Her blood pressure is 146/87 (abnormal) and her pulse is 76. Her respiration is 20 and oxygen saturation is 98%.   Wt Readings from Last 3 Encounters:  08/01/21 173 lb (78.5 kg)  07/04/21 182 lb (82.6 kg)  06/25/21 187 lb 9.8 oz (85.1 kg)    Physical Exam Vitals reviewed.  HENT:     Head: Normocephalic and atraumatic.  Eyes:     Pupils: Pupils are equal, round, and reactive to light.  Cardiovascular:     Rate and Rhythm: Normal rate and regular rhythm.     Heart sounds: Normal heart sounds.     Comments: Cardiac exam shows a regular rate and rhythm with no murmurs, rubs or bruits. Pulmonary:     Effort:  Pulmonary effort is normal.     Breath sounds: Normal breath sounds.     Comments: Her lungs sound clear bilaterally.  I hear no wheezing. Abdominal:     General: Bowel sounds are normal.     Palpations: Abdomen is soft.     Comments: Abdominal exam shows soft abdomen.  She has a laparotomy scar that is healed.  This is in the midline.  She has no fluid wave.  Bowel sounds are active.  There is no guarding or rebound tenderness.  There is no palpable liver or spleen tip.  Musculoskeletal:        General: No tenderness or deformity. Normal range of motion.     Cervical back: Normal range of motion.  Lymphadenopathy:     Cervical: No cervical adenopathy.  Skin:    General: Skin is warm and dry.     Findings: No erythema or rash.  Neurological:     Mental Status: She is alert and oriented to person, place, and time.  Psychiatric:        Behavior: Behavior normal.        Thought Content: Thought content normal.        Judgment: Judgment normal.     Lab Results  Component Value Date   WBC 11.2 (H) 08/01/2021   HGB 12.1 08/01/2021   HCT 37.5 08/01/2021   MCV 83.0 08/01/2021   PLT 385 08/01/2021     Chemistry      Component Value Date/Time   NA 145 08/01/2021 1020   K 2.0 (LL) 08/01/2021 1020   CL 98 08/01/2021 1020   CO2 33 (H) 08/01/2021 1020   BUN 5 (L) 08/01/2021 1020   CREATININE 0.73 08/01/2021 1020   CREATININE 0.73 01/26/2020 1006      Component Value Date/Time   CALCIUM 8.2 (L) 08/01/2021 1020   ALKPHOS 138 (H) 08/01/2021 1020   AST 31 08/01/2021 1020   ALT 34 08/01/2021 1020   BILITOT 1.0 08/01/2021 1020      Impression and Plan: Cassidy Tran is a very nice 46 year old white female.  She has von Willebrand disease.  Her problem now that she  has carcinoid.  She has diarrhea.  Again I cannot imagine the carcinoid is causing the diarrhea since she does not have metastatic disease.  I do believe that the diarrhea is because she has a shortened bowel.  I do not  think it be a bad idea to try her on some Creon.  This may help a little bit.  She did have her gallbladder taken out this may be part of the problem.  Again, we will have to see about treating her with Somatuline.  I am not sure that she would be a candidate for adjuvant therapy with her positive lymph node.  I will have to look into this.  We will have to see what the nuclear medicine scan shows to see if we can see any obvious carcinoids.  We will have to see what her chromogranin A level is.  I just hate that she has had a tough time.  I really hate the fact that she was in the hospital for such a long time.  Hopefully, we can make her quality of life better.  Clearly, her quality of life is being dictated by the diarrhea.  I would like to see her back in about a month or so.  Hopefully, she will be feeling better.  If we see that she has obvious metastatic carcinoid or Chromogranin A level is high, we will get her back sooner and see about Somatuline.   Volanda Napoleon, MD 6/5/20233:12 PM

## 2021-08-01 NOTE — Telephone Encounter (Signed)
-----   Message from Volanda Napoleon, MD sent at 08/01/2021  1:55 PM EDT ----- Call and let her know that she needs to come back for some IV magnesium.  Please set this up this week.

## 2021-08-02 ENCOUNTER — Telehealth: Payer: Self-pay | Admitting: Neurology

## 2021-08-02 ENCOUNTER — Ambulatory Visit: Payer: No Typology Code available for payment source | Admitting: Neurology

## 2021-08-02 ENCOUNTER — Other Ambulatory Visit: Payer: Self-pay

## 2021-08-02 ENCOUNTER — Encounter: Payer: Self-pay | Admitting: Neurology

## 2021-08-02 VITALS — BP 146/81 | HR 88 | Ht 63.0 in | Wt 173.0 lb

## 2021-08-02 DIAGNOSIS — G40309 Generalized idiopathic epilepsy and epileptic syndromes, not intractable, without status epilepticus: Secondary | ICD-10-CM

## 2021-08-02 DIAGNOSIS — M21371 Foot drop, right foot: Secondary | ICD-10-CM

## 2021-08-02 DIAGNOSIS — M21372 Foot drop, left foot: Secondary | ICD-10-CM

## 2021-08-02 DIAGNOSIS — R202 Paresthesia of skin: Secondary | ICD-10-CM

## 2021-08-02 MED ORDER — GABAPENTIN 100 MG PO CAPS: 100.0000 mg | ORAL_CAPSULE | Freq: Three times a day (TID) | ORAL | 3 refills | Status: DC

## 2021-08-02 MED ORDER — ZONISAMIDE 100 MG PO CAPS: 100.0000 mg | ORAL_CAPSULE | Freq: Two times a day (BID) | ORAL | 3 refills | Status: DC

## 2021-08-02 MED ORDER — LEVETIRACETAM 750 MG PO TABS: 750.0000 mg | ORAL_TABLET | Freq: Two times a day (BID) | ORAL | 3 refills | Status: DC

## 2021-08-02 NOTE — Telephone Encounter (Signed)
Patient called, she went to pick up meds but wasn't able to. Aquino changed dosage so she needs the correct one sent in. Zonisamide '100mg'$ : take 1 capsule twice a day

## 2021-08-02 NOTE — Patient Instructions (Signed)
Good to meet you.  Increase Zonisamide '100mg'$ : take 1 capsule twice a day  2. Restart Keppra (Levetiracetam) '750mg'$ : take 1 tablet twice a day  3. Restart Gabapentin '100mg'$ : take 1 capsule three times a day  4. Schedule EMG/NCV of both LE  5. Follow-up in 4 months, call for any changes   Seizure Precautions: 1. If medication has been prescribed for you to prevent seizures, take it exactly as directed.  Do not stop taking the medicine without talking to your doctor first, even if you have not had a seizure in a long time.   2. Avoid activities in which a seizure would cause danger to yourself or to others.  Don't operate dangerous machinery, swim alone, or climb in high or dangerous places, such as on ladders, roofs, or girders.  Do not drive unless your doctor says you may.  3. If you have any warning that you may have a seizure, lay down in a safe place where you can't hurt yourself.    4.  No driving for 6 months from last seizure, as per [facility].   Please refer to the following link on the [place] website for more information: http://[email or web address].org/[email or web address]/drivingu.cfm   5.  Maintain good sleep hygiene. Avoid alcohol.  6.  Contact your doctor if you have any problems that may be related to the medicine you are taking.  7.  Call 911 and bring the patient back to the ED if:        A.  The seizure lasts longer than 5 minutes.       B.  The patient doesn't awaken shortly after the seizure  C.  The patient has new problems such as difficulty seeing, speaking or moving  D.  The patient was injured during the seizure  E.  The patient has a temperature over 102 F (39C)  F.  The patient vomited and now is having trouble breathing

## 2021-08-02 NOTE — Progress Notes (Signed)
NEUROLOGY CONSULTATION NOTE  Cassidy Tran MRN: [id number] DOB: [birth year]  Referring provider: Dr. Janie Morning Primary care provider: Dr. Janie Morning  Reason for consult:  establish care for seizures  Dear Dr Theda Sers:  Thank you for your kind referral of Cassidy Tran for consultation of the above symptoms. Although her history is well known to you, please allow me to reiterate it for the purpose of our medical record. The patient was accompanied to the clinic by husband [name] who also provides collateral information. Records and images were personally reviewed where available.   HISTORY OF PRESENT ILLNESS: This is a pleasant 46 year old right-handed woman with a history of von Willebrand disease, neuroendocrine tumor, Juvenile Myoclonic Epilepsy since age 43, presenting to establish care for seizures. Records were reviewed. Seizures are usually preceded by myoclonic jerks. She reports longest seizure-free interval was 25 years, then she had a GTC in 2009, 2010, and 2011. She and her husband deny any convulsions since 2011. She was being followed at [facility], last visit was in 2017. At that time she was on Zonisamide '100mg'$  BID and Depakote ER '2000mg'$  qhs. She has not seen a neurologist since then. She started having diarrhea in 08/2020 and was found to have biopsy-proven neuroendocrine tumor of the terminal ileum. She underwent GI surgery (exlap, R hemicolectomy, cholecystectomy) in 05/05/21, course was complicated by respiratory failure requiring intubation and trach. She had altered mental status with hyperammonemia (104) felt to be due to Depakote. She was seen by Neurology during her admission and Depakote was stopped. She was on IV Levetiracetam and had continuous EEG monitoring for 48 hours with diffuse background slowing, no epileptiform discharges or electrographic seizures. She was discharged on Levetiracetam '750mg'$  BID and Zonisamide '100mg'$  BID, however states that the LEV  was stopped and that she has only been taking Zonisamide '100mg'$  qhs. She and her husband deny any staring/unresponsive episodes, gaps in time, olfactory/gustatory hallucinations. She mostly has myoclonic jerks if she forgets to take her medication or when sleep deprived. She could not remember how she was taking the Zonisamide prior to hospitalization.  She was in inpatient rehab after hospitalization from 4/9 to 06/25/21. She reports that she was dropped by a nurse tech and fell on her feet, and since then she has had bilateral foot drop R>L and constant burning/tingling on the dorsum of both feet and below her knees. She reported right foot pain and had an xray showing a subacute right 5th metatarsal fracture. She reports that Gabapentin '100mg'$  TID was started however she ran out of medication, it does help with the paresthesias. She ambulates with a walker. She denies any symptoms of neuropathy prior to her hospitalization. She has also been having numbness in the last 2 fingers on her right hand since hospitalization. She has tightness in her neck and back. She continues to deal with frequent diarrhea, no urinary issues. The diarrhea is causing a lot of anxiety, keeping her up at night due to frequent BMs. She has a history of VP shunt secondary to Chiari malformation, she was seen by Neurosurgery during her admission and had a lumbar puncture with normal CSF. She continues to follow-up with Neurosurgery for the shunt.   Epilepsy Risk Factors:  She had a normal birth and early development.  There is no history of febrile convulsions, CNS infections such as meningitis/encephalitis, significant traumatic brain injury, neurosurgical procedures, or family history of seizures.  Prior ASMs: Topiramate Prior headache preventative medications: amitriptyline, Topiramate  Diagnostic Data: EEGs: EEG in 2011 reported intermittent 4-5 Hz spike and polyspike and wave complexes MRI: MRI brain in 2015 no acute changes,  hippocampi symmetric. At that time, note of cerebellar tonsillar ectopia measuring 26m.  Head CT without contrast 05/2021 VP shunt in the occipital region on the right terminates in the frontal lobe on left, ventricles stable, no hydrocephalus. Prior Chiari decompression with stable appearance of foramen magnum   PAST MEDICAL HISTORY: Past Medical History:  Diagnosis Date   Acid reflux    Allergy    Anxiety    Arthritis    lower back   ASCUS (atypical squamous cells of undetermined significance) on Pap smear 03/2011   NEG HR HPV   Asthma    Cancer (HCC)    skin cancer- age 18ish   Cervical dysplasia, mild 08/2010   LGSIL colposcopy biopsy showing koilocytotic atypia   [place]otting disorder ([place]    Depression    Endometriosis    Headache(784.0)    High risk HPV infection 12/2011   Pap normal   Hypertension    Pneumonia    2016ish   Seizures ([place]    seizure disorder   Smoker    Von Willebrand disease ([place]     PAST SURGICAL HISTORY: Past Surgical History:  Procedure Laterality Date   APPLICATION OF CRANIAL NAVIGATION N/A 11/24/2016   Procedure: APPLICATION OF CRANIAL NAVIGATION;  Surgeon: EKristeen Miss MD;  [place] [facility] [place]  Service: Neurosurgery;  Laterality: N/A;   BRAIN SURGERY     BREAST BIOPSY Left    CHOLECYSTECTOMY N/A 05/05/2021   Procedure: CHOLECYSTECTOMY;  Surgeon: LJesusita Oka MD;  [facility]  Service: General;  Laterality: N/A;   COLON RESECTION N/A 05/05/2021   Procedure: RIGHT HEMICOLECTOMY;  Surgeon: LJesusita Oka MD;  [place] [facility]  Service: General;  Laterality: N/A;   COLPOSCOPY     CYSTOSCOPY N/A 10/14/2012   Procedure: CYSTOSCOPY;  Surgeon: TAnastasio Auerbach MD;  [place];  Service: Gynecology;  Laterality: N/A;   DILATION AND CURETTAGE OF UTERUS     HYSTEROSCOPY WITH D & C  10/14/2010   Procedure: DILATATION AND CURETTAGE (D&C) /HYSTEROSCOPY;  Surgeon: TAnastasio Auerbach MD;  [place] [facility];  Service: Gynecology;   Laterality: N/A;   LAPAROSCOPIC HYSTERECTOMY N/A 10/14/2012   Procedure: HYSTERECTOMY TOTAL LAPAROSCOPIC;  Surgeon: TAnastasio Auerbach MD;  [facility];  Service: Gynecology;  Laterality: N/A;  CPT [id number] 2 1/2 hours  Dr. JUvaldo Risingto assist.   LAPAROSCOPY  04/27/2011   Procedure: LAPAROSCOPY OPERATIVE;  Surgeon: TAnastasio Auerbach MD;  [facility];  Service: Gynecology;  Laterality: N/A;  removal right cyst , lysis of adhesions, biopsy of peritoneum   LAPAROTOMY N/A 05/05/2021   Procedure: EXPLORATORY LAPAROTOMY;  Surgeon: LJesusita Oka MD;  [place] [facility]  Service: General;  Laterality: N/A;   [place]   PEG PLACEMENT N/A 05/30/2021   Procedure: PERCUTANEOUS ENDOSCOPIC GASTROSTOMY (PEG) PLACEMENT;  Surgeon: LJesusita Oka MD;  [place]  Service: General;  Laterality: N/A;   SUBOCCIPITAL CRANIECTOMY CERVICAL LAMINECTOMY N/A 11/06/2016   Procedure: Suboccipital decompression for chiari malformation;  Surgeon: EKristeen Miss MD;  [place] [facility]  Service: Neurosurgery;  Laterality: N/A;   TONSILLECTOMY     TRACHEOSTOMY TUBE PLACEMENT N/A 05/30/2021   Procedure: TRACHEOSTOMY;  Surgeon: LJesusita Oka MD;  [place] [facility]  Service: General;  Laterality: N/A;  VENTRICULOPERITONEAL SHUNT N/A 11/24/2016   Procedure: SHUNT INSERTION VENTRICULAR-PERITONEAL with Brainlab;  Surgeon: Kristeen Miss, MD;  [place];  Service: Neurosurgery;  Laterality: N/A;  right side approach   WISDOM TOOTH EXTRACTION  1993    MEDICATIONS: Current Outpatient Medications on File Prior to Visit  Medication Sig Dispense Refill   acetaminophen (TYLENOL) 325 MG tablet Take 1-2 tablets (325-650 mg total) by mouth every 4 (four) hours as needed for mild pain.     budesonide (PULMICORT) 0.5 MG/2ML nebulizer solution Take 1 mL (0.25 mg total) by nebulization 2 (two) times daily. 60 mL 0   carvedilol (COREG) 3.125 MG tablet Take 1 tablet (3.125 mg total)  by mouth 2 (two) times daily with a meal. 60 tablet 0   cholestyramine (QUESTRAN) 4 g packet Take 0.5 packets (2 g total) by mouth daily. 30 each 0   diazepam (VALIUM) 2 MG tablet Take 1 tablet (2 mg total) by mouth every 12 (twelve) hours as needed for anxiety. 15 tablet 0   diphenoxylate-atropine (LOMOTIL) 2.5-0.025 MG tablet Take 2 tablets by mouth 4 (four) times daily. (Patient taking differently: Take 2 tablets by mouth 4 (four) times daily as needed.) 30 tablet 0   gabapentin (NEURONTIN) 100 MG capsule Take 100 mg by mouth 3 (three) times daily.     lipase/protease/amylase (CREON) 36000 UNITS CPEP capsule Take 2 capsules (72,000 Units total) by mouth 3 (three) times daily with meals. May also take 1 capsule (36,000 Units total) as needed (with snacks). 240 capsule 11   loperamide (IMODIUM A-D) 2 MG tablet Take 2 mg by mouth 4 (four) times daily as needed for diarrhea or loose stools.     methocarbamol (ROBAXIN) 750 MG tablet Take 750 mg by mouth every 6 (six) hours as needed.     montelukast (SINGULAIR) 10 MG tablet TAKE 1 TABLET BY MOUTH AT BEDTIME 30 tablet 0   Multiple Vitamin (MULTIVITAMIN WITH MINERALS) TABS tablet Take 1 tablet by mouth daily.     pantoprazole (PROTONIX) 40 MG tablet Take 1 tablet (40 mg total) by mouth daily. 30 tablet 1   potassium chloride SA (KLOR-CON M) 20 MEQ tablet Take 2 tablets (40 mEq total) by mouth 2 (two) times daily. Take 2 tablets twice a day for 7 days and then take 1 tablet twice a day 120 tablet 2   Tiotropium Bromide-Olodaterol (STIOLTO RESPIMAT) 2.5-2.5 MCG/ACT AERS Inhale 2 puffs into the lungs daily. 4 g 0   zonisamide (ZONEGRAN) 100 MG capsule Take 1 capsule (100 mg total) by mouth at bedtime. 30 capsule 0   albuterol (VENTOLIN HFA) 108 (90 Base) MCG/ACT inhaler Inhale into the lungs. (Patient not taking: Reported on 08/01/2021)     atorvastatin (LIPITOR) 20 MG tablet Take 1 tablet (20 mg total) by mouth daily. (Patient not taking: Reported on  08/02/2021) 30 tablet 0   mometasone (NASONEX) 50 MCG/ACT nasal spray Use one to two sprays in each nostril once daily (Patient not taking: Reported on 08/01/2021) 17 g 5   oxyCODONE (OXY IR/ROXICODONE) 5 MG immediate release tablet Take 1-2 tablets (5-10 mg total) by mouth every 6 (six) hours as needed for moderate pain or severe pain ('5mg'$  for moderate pain, '10mg'$  for severe pain). (Patient not taking: Reported on 08/01/2021) 20 tablet 0   potassium chloride SA (KLOR-CON M) 20 MEQ tablet Take 1 tablet (20 mEq total) by mouth 2 (two) times daily. (Patient not taking: Reported on 08/02/2021) 14 tablet 0   psyllium (HYDROCIL/METAMUCIL)  95 % PACK Take 1 packet by mouth 2 (two) times daily. (Patient not taking: Reported on 08/01/2021) 240 each    tretinoin (RETIN-A) 0.05 % cream Apply 1 application topically at bedtime as needed (acne). (Patient not taking: Reported on 08/01/2021)     No current facility-administered medications on file prior to visit.    ALLERGIES: Allergies  Allergen Reactions   Aspirin Other (See Comments)    Childhood reaction   Codeine Nausea And Vomiting    Tolerates Hydrocodone   Erythromycin Other (See Comments)    Childhood reaction, tolerate Zpak Childhood reaction does not know.   Lactose Intolerance (Gi) Diarrhea   Nsaids Nausea And Vomiting   Penicillins Other (See Comments)    Has patient had a PCN reaction causing immediate rash, facial/tongue/throat swelling, SOB or lightheadedness with hypotension: doesn't remember childhood Reaction  Has patient had a PCN reaction causing severe rash involving mucus membranes or skin necrosis: NO Has patient had a PCN reaction that required hospitalization NO Has patient had a PCN reaction occurring within the last 10 years: NO If all of the above answers are "NO", then may proceed with Cephalosporin use.     FAMILY HISTORY: Family History  Problem Relation Age of Onset   Hypertension Mother    Heart disease Mother     Hyperlipidemia Mother    Diabetes Father    Hypertension Father    Heart disease Father    Hyperlipidemia Father    Stroke Father    Prostate cancer Father    Lymphoma Sister        NON-HODGKINS LYMPHOMA   Cancer Maternal Uncle        Lung and pancreatic cancer   Colon cancer Neg Hx    Rectal cancer Neg Hx    Esophageal cancer Neg Hx     SOCIAL HISTORY: Social History   Socioeconomic History   Marital status: Married    Spouse name: Not on file   Number of children: Not on file   Years of education: Not on file   Highest education level: Not on file  Occupational History   Occupation: Customer Service  Tobacco Use   Smoking status: Former    Packs/day: 0.50    Years: 20.00    Pack years: 10.00    Types: Cigarettes    Quit date: 05/05/2021    Years since quitting: 0.2   Smokeless tobacco: Never  Vaping Use   Vaping Use: Never used  Substance and Sexual Activity   Alcohol use: Not Currently    Alcohol/week: 0.0 standard drinks    Comment: Rare   Drug use: No   Sexual activity: Yes    Birth control/protection: Surgical    Comment: 1st intercourse 46 yo-More than 5 partners, MARRIED- 53 YRS   Other Topics Concern   Not on file  Social History Narrative   Right handed    Live with husband and dog in a one level home.    Caffeine rarely   Social Determinants of Radio broadcast assistant Strain: Not on file  Food Insecurity: Not on file  Transportation Needs: Not on file  Physical Activity: Not on file  Stress: Not on file  Social Connections: Not on file  Intimate Partner Violence: Not on file     PHYSICAL EXAM: Vitals:   08/02/21 0854  BP: (!) 146/81  Pulse: 88  SpO2: 96%   General: No acute distress Head:  Normocephalic/atraumatic Skin/Extremities: No rash, no edema Neurological Exam:  Mental status: alert and oriented to person, place, and time, no dysarthria or aphasia, Fund of knowledge is appropriate.  Recent and remote memory are impaired.   Attention and concentration are normal.   Cranial nerves: CN I: not tested CN II: pupils equal, round and reactive to light, visual fields intact CN III, IV, VI:  full range of motion, no nystagmus, no ptosis CN V: facial sensation intact CN VII: upper and lower face symmetric CN VIII: hearing intact to conversation Bulk & Tone: normal, no fasciculations. Motor: 5/5 on both UE, 5/5 bilateral hip flexion, knee flexion/extension, plantar flexion, inversion. 2/5 right foot dorsiflexion, 3/5 right foot eversion, 0/5 great toe extension. 3+/5 left foot dorsiflexion and eversion, 0/5 left great toe extension. Sensation: intact to light touch, cold, pin. Decreased vibration sense to knees bilaterally. Reports hyperesthesia to pin on both feet.  Deep Tendon Reflexes: +2 throughout except for absent ankle jerks bilaterally Plantar responses: downgoing bilaterally Cerebellar: no incoordination on finger to nose testing Gait: steppage gait on right with walker Tremor: none   IMPRESSION: This is a pleasant 46 year old right-handed woman with a history of von Willebrand disease, neuroendocrine tumor, Juvenile Myoclonic Epilepsy since age 63, presenting to establish care for seizures. They deny any convulsions since 2011. Seizure medications were adjusted during recent hospital admission for GI issues. She is off Depakote due to hyperammonemia. She does not recall the side effects on Levetiracetam years ago, agrees to restart Levetiracetam '750mg'$  BID initiated in hospital. Resume Zonisamide '100mg'$  BID. The Gabapentin '100mg'$  TID helps with neuropathic pain, refills sent, monitor symptoms as this can worsen seizures in patients with primary generalized epilepsy. During her prolonged ICU stay/hospitalization, she developed bilateral foot drop R>L. Peroneal nerve injury can occur with prolonged bed rest, she reports symptoms started injury in the hospital. EMG/NCV of both lower extremities will be ordered, continue PT,  recommend AFO. She is not driving. Follow-up in 4 months, call for any changes.    Thank you for allowing me to participate in the care of this patient. Please do not hesitate to call for any questions or concerns.   Ellouise Newer, M.D.  CC: Dr. Theda Sers

## 2021-08-02 NOTE — Telephone Encounter (Signed)
Patient is going to finish up her old Rx and increase it to bid and after the medication refill is completed then she will have new RX filled with direction change

## 2021-08-03 ENCOUNTER — Inpatient Hospital Stay: Payer: No Typology Code available for payment source

## 2021-08-03 ENCOUNTER — Encounter: Payer: Self-pay | Admitting: Neurology

## 2021-08-03 ENCOUNTER — Telehealth: Payer: Self-pay

## 2021-08-03 ENCOUNTER — Telehealth (HOSPITAL_COMMUNITY): Payer: Self-pay | Admitting: Pharmacy Technician

## 2021-08-03 ENCOUNTER — Other Ambulatory Visit (HOSPITAL_COMMUNITY): Payer: Self-pay

## 2021-08-03 DIAGNOSIS — C7A010 Malignant carcinoid tumor of the duodenum: Secondary | ICD-10-CM | POA: Diagnosis not present

## 2021-08-03 LAB — VON WILLEBRAND PANEL
Coagulation Factor VIII: 120 % (ref 56–140)
Ristocetin Co-factor, Plasma: 111 % (ref 50–200)
Von Willebrand Antigen, Plasma: 149 % (ref 50–200)

## 2021-08-03 LAB — COAG STUDIES INTERP REPORT

## 2021-08-03 LAB — CHROMOGRANIN A: Chromogranin A (ng/mL): 463.9 ng/mL — ABNORMAL HIGH (ref 0.0–101.8)

## 2021-08-03 MED ORDER — MAGNESIUM SULFATE 4 GM/100ML IV SOLN
4.0000 g | Freq: Once | INTRAVENOUS | Status: AC
  Administered 2021-08-03: 4 g via INTRAVENOUS
  Filled 2021-08-03: qty 100

## 2021-08-03 MED ORDER — SODIUM CHLORIDE 0.9 % IV SOLN: INTRAVENOUS | Status: DC

## 2021-08-03 NOTE — Telephone Encounter (Signed)
Patient Advocate Encounter   Received notification that prior authorization for Zonisamide '100MG'$  capsules is required.   PA submitted on 08/03/2021 Key [id number] Status is pending       Lyndel Safe, [place] Patient Advocate Specialist [place] Patient Advocate Team Direct Number: [phone]  Fax: [phone]

## 2021-08-03 NOTE — Telephone Encounter (Signed)
-----   Message from Volanda Napoleon, MD sent at 08/03/2021  3:57 PM EDT ----- Call - the Chromogranin A level is high.  This indicates that there is neuroendocrine tumor somewhere.  We will need  to see what the PET scan shows.  I suspect that we will need to give you Somatuline -- this can help "dry up" any cancer that is active.  [name]

## 2021-08-03 NOTE — Patient Instructions (Signed)
Magnesium Sulfate Injection What is this medication? MAGNESIUM SULFATE (mag NEE zee um SUL fate) prevents and treats low levels of magnesium in your body. It may also be used to prevent and treat seizures during pregnancy in people with high blood pressure disorders, such as preeclampsia or eclampsia. Magnesium plays an important role in maintaining the health of your muscles and nervous system. This medicine may be used for other purposes; ask your health care provider or pharmacist if you have questions. What should I tell my care team before I take this medication? They need to know if you have any of these conditions: Heart disease History of irregular heart beat Kidney disease An unusual or allergic reaction to magnesium sulfate, medications, foods, dyes, or preservatives Pregnant or trying to get pregnant Breast-feeding How should I use this medication? This medication is for infusion into a vein. It is given in a hospital or clinic setting. Talk to your care team about the use of this medication in children. While this medication may be prescribed for selected conditions, precautions do apply. Overdosage: If you think you have taken too much of this medicine contact a poison control center or emergency room at once. NOTE: This medicine is only for you. Do not share this medicine with others. What if I miss a dose? This does not apply. What may interact with this medication? Certain medications for anxiety or sleep Certain medications for seizures like phenobarbital Digoxin Medications that relax muscles for surgery Narcotic medications for pain This list may not describe all possible interactions. Give your health care provider a list of all the medicines, herbs, non-prescription drugs, or dietary supplements you use. Also tell them if you smoke, drink alcohol, or use illegal drugs. Some items may interact with your medicine. What should I watch for while using this medication? Your  condition will be monitored carefully while you are receiving this medication. You may need blood work done while you are receiving this medication. What side effects may I notice from receiving this medication? Side effects that you should report to your care team as soon as possible: Allergic reactions--skin rash, itching, hives, swelling of the face, lips, tongue, or throat High magnesium level--confusion, drowsiness, facial flushing, redness, sweating, muscle weakness, fast or irregular heartbeat, trouble breathing Low blood pressure--dizziness, feeling faint or lightheaded, blurry vision Side effects that usually do not require medical attention (report to your care team if they continue or are bothersome): Headache Nausea This list may not describe all possible side effects. Call your doctor for medical advice about side effects. You may report side effects to FDA at 1-800-FDA-1088. Where should I keep my medication? This medication is given in a hospital or clinic and will not be stored at home. NOTE: This sheet is a summary. It may not cover all possible information. If you have questions about this medicine, talk to your doctor, pharmacist, or health care provider.  2023 Elsevier/Gold Standard (2020-04-29 00:00:00)  

## 2021-08-04 ENCOUNTER — Encounter: Payer: Self-pay | Admitting: *Deleted

## 2021-08-04 ENCOUNTER — Other Ambulatory Visit (HOSPITAL_COMMUNITY): Payer: Self-pay

## 2021-08-04 NOTE — Telephone Encounter (Signed)
Patient Advocate Encounter  Prior Authorization for Zonisamide '100MG'$  capsules  has been approved.         Lyndel Safe, [place] Patient Advocate Specialist [place] Patient Advocate Team Direct Number: [phone]  Fax: [phone]

## 2021-08-09 ENCOUNTER — Encounter: Payer: Self-pay | Admitting: Neurology

## 2021-08-09 DIAGNOSIS — M21371 Foot drop, right foot: Secondary | ICD-10-CM

## 2021-08-12 ENCOUNTER — Telehealth: Payer: Self-pay | Admitting: Pharmacy Technician

## 2021-08-12 NOTE — Telephone Encounter (Signed)
Patient Advocate Encounter  Received notification from [place] that prior authorization for PANTOPRAZOLE '40MG'$  is required.   PA submitted on 6.16.23 Key [id number] Status is pending    Luciano Cutter, CPhT Patient Advocate Phone: [phone]

## 2021-08-13 NOTE — Progress Notes (Unsigned)
Synopsis: Referred for chronic respiratory failure by Janie Morning, DO  Subjective:   PATIENT ID: Cassidy Tran GENDER: female DOB: 06-12-1975, MRN: [id number]  No chief complaint on file.  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 Course has been complicated by acute encephalopathy possibly related to hyperammonemia from depakote, ARDS with intubation on 3/16, required tracheostomy and eventually decannulated 06/14/21, discharged home from rehab 06/25/21 on 1L O2.   Saw oncology 6/5 - uncertain if she will be candidate for adjuvant chemo, chromogranin A level high and has upcoming PET scan, may start somatuline  Otherwise pertinent review of systems is negative.  Past Medical History:  Diagnosis Date   Acid reflux    Allergy    Anxiety    Arthritis    lower back   ASCUS (atypical squamous cells of undetermined significance) on Pap smear 03/2011   NEG HR HPV   Asthma    Cancer (HCC)    skin cancer- age 18ish   Cervical dysplasia, mild 08/2010   LGSIL colposcopy biopsy showing koilocytotic atypia   Clotting disorder ([place])    Depression    Endometriosis    Headache(784.0)    High risk HPV infection 12/2011   Pap normal   Hypertension    Pneumonia    2016ish   Seizures (HCC)    seizure disorder   Smoker    Von Willebrand disease ([place])      Family History  Problem Relation Age of Onset   Hypertension Mother    Heart disease Mother    Hyperlipidemia Mother    Diabetes Father    Hypertension Father    Heart disease Father    Hyperlipidemia Father    Stroke Father    Prostate cancer Father    Lymphoma Sister        NON-HODGKINS LYMPHOMA   Cancer Maternal Uncle        Lung and pancreatic cancer   Colon cancer Neg Hx     Rectal cancer Neg Hx    Esophageal cancer Neg Hx      Past Surgical History:  Procedure Laterality Date   APPLICATION OF CRANIAL NAVIGATION N/A 11/24/2016   Procedure: APPLICATION OF CRANIAL NAVIGATION;  Surgeon: Kristeen Miss, MD;  Location: [facility];  Service: Neurosurgery;  Laterality: N/A;   BRAIN SURGERY     BREAST BIOPSY Left    CHOLECYSTECTOMY N/A 05/05/2021   Procedure: CHOLECYSTECTOMY;  Surgeon: [name], MD;  Location: [facility];  Service: General;  Laterality: N/A;   COLON RESECTION N/A 05/05/2021   Procedure: RIGHT HEMICOLECTOMY;  Surgeon: [name], MD;  Location: [place] [facility];  Service: General;  Laterality: N/A;   COLPOSCOPY     CYSTOSCOPY N/A 10/14/2012   Procedure: CYSTOSCOPY;  Surgeon: [name], MD;  Location: [facility];  Service: Gynecology;  Laterality: N/A;   DILATION AND CURETTAGE OF UTERUS     HYSTEROSCOPY WITH D & C  10/14/2010   Procedure: DILATATION AND CURETTAGE (D&C) /HYSTEROSCOPY;  Surgeon: [name], MD;  Location: [facility];  Service: Gynecology;  Laterality: N/A;   LAPAROSCOPIC HYSTERECTOMY N/A 10/14/2012   Procedure: HYSTERECTOMY TOTAL LAPAROSCOPIC;  Surgeon: [name], MD;  Location: [facility];  Service: Gynecology;  Laterality: N/A;  CPT 58750  2 1/2 hours  Dr. Uvaldo Rising to assist.   LAPAROSCOPY  04/27/2011   Procedure: LAPAROSCOPY OPERATIVE;  Surgeon: [name], MD;  Location: [facility];  Service: Gynecology;  Laterality: N/A;  removal right cyst , lysis of adhesions, biopsy of peritoneum   LAPAROTOMY N/A 05/05/2021   Procedure: EXPLORATORY LAPAROTOMY;  Surgeon: [name], MD;  Location: [place];  Service: General;  Laterality: N/A;   [place]   PEG PLACEMENT N/A 05/30/2021   Procedure: PERCUTANEOUS ENDOSCOPIC GASTROSTOMY (PEG) PLACEMENT;  Surgeon: [name], MD;  Location: [facility];  Service: General;  Laterality: N/A;   SUBOCCIPITAL CRANIECTOMY CERVICAL LAMINECTOMY N/A 11/06/2016    Procedure: Suboccipital decompression for chiari malformation;  Surgeon: Kristeen Miss, MD;  Location: [facility];  Service: Neurosurgery;  Laterality: N/A;   TONSILLECTOMY     TRACHEOSTOMY TUBE PLACEMENT N/A 05/30/2021   Procedure: TRACHEOSTOMY;  Surgeon: [name], MD;  Location: [facility];  Service: General;  Laterality: N/A;   VENTRICULOPERITONEAL SHUNT N/A 11/24/2016   Procedure: SHUNT INSERTION VENTRICULAR-PERITONEAL with Brain[facility];  Surgeon: Kristeen Miss, MD;  Location: [facility];  Service: Neurosurgery;  Laterality: N/A;  right side approach   WISDOM TOOTH EXTRACTION  1993    Social History   Socioeconomic History   Marital status: Married    Spouse name: Not on file   Number of children: Not on file   Years of education: Not on file   Highest education level: Not on file  Occupational History   Occupation: Customer Service  Tobacco Use   Smoking status: Former    Packs/day: 0.50    Years: 20.00    Total pack years: 10.00    Types: Cigarettes    Quit date: 05/05/2021    Years since quitting: 0.2   Smokeless tobacco: Never  Vaping Use   Vaping Use: Never used  Substance and Sexual Activity   Alcohol use: Not Currently    Alcohol/week: 0.0 standard drinks of alcohol    Comment: Rare   Drug use: No   Sexual activity: Yes    Birth control/protection: Surgical    Comment: 1st intercourse 46 yo-More than 5 partners, MARRIED- 34 YRS   Other Topics Concern   Not on file  Social History Narrative   Right handed    Live with husband and dog in a one level home.    Caffeine rarely   Social Determinants of Radio broadcast assistant Strain: Not on file  Food Insecurity: Not on file  Transportation Needs: Not on file  Physical Activity: Not on file  Stress: Not on file  Social Connections: Not on file  Intimate Partner Violence: Not on file     Allergies  Allergen Reactions   Aspirin Other (See Comments)    Childhood reaction   Codeine Nausea And Vomiting    Tolerates  Hydrocodone   Erythromycin Other (See Comments)    Childhood reaction, tolerate Zpak Childhood reaction does not know.   Lactose Intolerance (Gi) Diarrhea   Nsaids Nausea And Vomiting   Penicillins Other (See Comments)    Has patient had a PCN reaction causing immediate rash, facial/tongue/throat swelling, SOB or lightheadedness with hypotension: doesn't remember childhood Reaction  Has patient had a PCN reaction causing severe rash involving mucus membranes or skin necrosis: NO Has patient had a PCN reaction that required hospitalization NO Has patient had a PCN reaction occurring within the last 10 years: NO If all of the  above answers are "NO", then may proceed with Cephalosporin use.      Outpatient Medications Prior to Visit  Medication Sig Dispense Refill   acetaminophen (TYLENOL) 325 MG tablet Take 1-2 tablets (325-650 mg total) by mouth every 4 (four) hours as needed for mild pain.     albuterol (VENTOLIN HFA) 108 (90 Base) MCG/ACT inhaler Inhale into the lungs. (Patient not taking: Reported on 08/01/2021)     atorvastatin (LIPITOR) 20 MG tablet Take 1 tablet (20 mg total) by mouth daily. (Patient not taking: Reported on 08/02/2021) 30 tablet 0   budesonide (PULMICORT) 0.5 MG/2ML nebulizer solution Take 1 mL (0.25 mg total) by nebulization 2 (two) times daily. 60 mL 0   carvedilol (COREG) 3.125 MG tablet Take 1 tablet (3.125 mg total) by mouth 2 (two) times daily with a meal. 60 tablet 0   cholestyramine (QUESTRAN) 4 g packet Take 0.5 packets (2 g total) by mouth daily. 30 each 0   diazepam (VALIUM) 2 MG tablet Take 1 tablet (2 mg total) by mouth every 12 (twelve) hours as needed for anxiety. 15 tablet 0   diphenoxylate-atropine (LOMOTIL) 2.5-0.025 MG tablet Take 2 tablets by mouth 4 (four) times daily. (Patient taking differently: Take 2 tablets by mouth 4 (four) times daily as needed.) 30 tablet 0   gabapentin (NEURONTIN) 100 MG capsule Take 1 capsule (100 mg total) by mouth 3 (three)  times daily. 270 capsule 3   levETIRAcetam (KEPPRA) 750 MG tablet Take 1 tablet (750 mg total) by mouth 2 (two) times daily. 180 tablet 3   lipase/protease/amylase (CREON) 36000 UNITS CPEP capsule Take 2 capsules (72,000 Units total) by mouth 3 (three) times daily with meals. May also take 1 capsule (36,000 Units total) as needed (with snacks). 240 capsule 11   loperamide (IMODIUM A-D) 2 MG tablet Take 2 mg by mouth 4 (four) times daily as needed for diarrhea or loose stools.     methocarbamol (ROBAXIN) 750 MG tablet Take 750 mg by mouth every 6 (six) hours as needed.     mometasone (NASONEX) 50 MCG/ACT nasal spray Use one to two sprays in each nostril once daily (Patient not taking: Reported on 08/01/2021) 17 g 5   montelukast (SINGULAIR) 10 MG tablet TAKE 1 TABLET BY MOUTH AT BEDTIME 30 tablet 0   Multiple Vitamin (MULTIVITAMIN WITH MINERALS) TABS tablet Take 1 tablet by mouth daily.     oxyCODONE (OXY IR/ROXICODONE) 5 MG immediate release tablet Take 1-2 tablets (5-10 mg total) by mouth every 6 (six) hours as needed for moderate pain or severe pain ('5mg'$  for moderate pain, '10mg'$  for severe pain). (Patient not taking: Reported on 08/01/2021) 20 tablet 0   pantoprazole (PROTONIX) 40 MG tablet Take 1 tablet (40 mg total) by mouth daily. 30 tablet 1   potassium chloride SA (KLOR-CON M) 20 MEQ tablet Take 1 tablet (20 mEq total) by mouth 2 (two) times daily. (Patient not taking: Reported on 08/02/2021) 14 tablet 0   potassium chloride SA (KLOR-CON M) 20 MEQ tablet Take 2 tablets (40 mEq total) by mouth 2 (two) times daily. Take 2 tablets twice a day for 7 days and then take 1 tablet twice a day 120 tablet 2   psyllium (HYDROCIL/METAMUCIL) 95 % PACK Take 1 packet by mouth 2 (two) times daily. (Patient not taking: Reported on 08/01/2021) 240 each    Tiotropium Bromide-Olodaterol (STIOLTO RESPIMAT) 2.5-2.5 MCG/ACT AERS Inhale 2 puffs into the lungs daily. 4 g 0   tretinoin (RETIN-A)  0.05 % cream Apply 1 application  topically at bedtime as needed (acne). (Patient not taking: Reported on 08/01/2021)     zonisamide (ZONEGRAN) 100 MG capsule Take 1 capsule (100 mg total) by mouth 2 (two) times daily. 180 capsule 3   No facility-administered medications prior to visit.       Objective:   Physical Exam:  General appearance: 46 y.o., female, NAD, conversant  Eyes: anicteric sclerae; PERRL, tracking appropriately HENT: NCAT; MMM Neck: Trachea midline; no lymphadenopathy, no JVD Lungs: CTAB, no crackles, no wheeze, with normal respiratory effort CV: RRR, no murmur  Abdomen: Soft, non-tender; non-distended, BS present  Extremities: No peripheral edema, warm Skin: Normal turgor and texture; no rash Psych: Appropriate affect Neuro: Alert and oriented to person and place, no focal deficit     There were no vitals filed for this visit.   on *** LPM *** RA BMI Readings from Last 3 Encounters:  08/02/21 30.65 kg/m  08/01/21 30.65 kg/m  07/04/21 32.24 kg/m   Wt Readings from Last 3 Encounters:  08/02/21 173 lb (78.5 kg)  08/01/21 173 lb (78.5 kg)  07/04/21 182 lb (82.6 kg)     CBC    Component Value Date/Time   WBC 11.2 (H) 08/01/2021 1020   WBC 8.6 06/22/2021 0556   RBC 4.52 08/01/2021 1020   HGB 12.1 08/01/2021 1020   HGB 12.5 08/07/2017 1836   HGB 13.8 09/27/2012 1557   HCT 37.5 08/01/2021 1020   HCT 38.2 08/07/2017 1836   HCT 40.8 09/27/2012 1557   PLT 385 08/01/2021 1020   PLT 405 08/07/2017 1836   MCV 83.0 08/01/2021 1020   MCV 87 08/07/2017 1836   MCV 88.5 09/27/2012 1557   MCH 26.8 08/01/2021 1020   MCHC 32.3 08/01/2021 1020   RDW 14.9 08/01/2021 1020   RDW 14.8 08/07/2017 1836   RDW 14.0 09/27/2012 1557   LYMPHSABS 3.5 08/01/2021 1020   LYMPHSABS 3.7 (H) 08/07/2017 1836   LYMPHSABS 4.4 Repeated and Verified (H) 09/27/2012 1557   MONOABS 0.7 08/01/2021 1020   MONOABS 0.5 09/27/2012 1557   EOSABS 0.2 08/01/2021 1020   EOSABS 0.1 08/07/2017 1836   BASOSABS 0.1  08/01/2021 1020   BASOSABS 0.0 08/07/2017 1836   BASOSABS 0.0 09/27/2012 1557     Chest Imaging: CXR 07/04/21 reviewed by me with significant interval improvement in alveolar opacities. Residual widespread increased interstitial opacities.   Pulmonary Functions Testing Results:     No data to display            Echocardiogram:   TTE 05/24/21:  1. Left ventricular ejection fraction, by estimation, is 60 to 65%. The  left ventricle has normal function. The left ventricle has no regional  wall motion abnormalities. There is moderate concentric left ventricular  hypertrophy. Left ventricular  diastolic parameters were normal.   2. Right ventricular systolic function is normal. The right ventricular  size is normal. Tricuspid regurgitation signal is inadequate for assessing  PA pressure.   3. The mitral valve is grossly normal. Trivial mitral valve  regurgitation. No evidence of mitral stenosis.   4. The aortic valve was not well visualized. Aortic valve regurgitation  is not visualized. No aortic stenosis is present.       Assessment & Plan:    Plan:      Maryjane Hurter, MD [facility] 08/13/2021 7:04 PM \

## 2021-08-15 ENCOUNTER — Encounter: Payer: Self-pay | Admitting: Student

## 2021-08-15 ENCOUNTER — Ambulatory Visit: Payer: No Typology Code available for payment source | Admitting: Student

## 2021-08-15 ENCOUNTER — Ambulatory Visit (INDEPENDENT_AMBULATORY_CARE_PROVIDER_SITE_OTHER): Payer: No Typology Code available for payment source | Admitting: Student

## 2021-08-15 VITALS — BP 142/74 | HR 82 | Temp 97.6°F | Ht 63.0 in | Wt 170.8 lb

## 2021-08-15 DIAGNOSIS — J9600 Acute respiratory failure, unspecified whether with hypoxia or hypercapnia: Secondary | ICD-10-CM

## 2021-08-15 DIAGNOSIS — J1282 Pneumonia due to coronavirus disease 2019: Secondary | ICD-10-CM | POA: Diagnosis not present

## 2021-08-15 DIAGNOSIS — J449 Chronic obstructive pulmonary disease, unspecified: Secondary | ICD-10-CM | POA: Diagnosis not present

## 2021-08-15 DIAGNOSIS — U071 COVID-19: Secondary | ICD-10-CM | POA: Diagnosis not present

## 2021-08-15 DIAGNOSIS — J849 Interstitial pulmonary disease, unspecified: Secondary | ICD-10-CM

## 2021-08-15 LAB — PULMONARY FUNCTION TEST
DL/VA % pred: 103 %
DL/VA: 4.53 ml/min/mmHg/L
DLCO cor % pred: 69 %
DLCO cor: 14.23 ml/min/mmHg
DLCO unc % pred: 66 %
DLCO unc: 13.62 ml/min/mmHg
FEF 25-75 Post: 1.37 L/sec
FEF 25-75 Pre: 0.91 L/sec
FEF2575-%Change-Post: 50 %
FEF2575-%Pred-Post: 48 %
FEF2575-%Pred-Pre: 32 %
FEV1-%Change-Post: 11 %
FEV1-%Pred-Post: 53 %
FEV1-%Pred-Pre: 48 %
FEV1-Post: 1.5 L
FEV1-Pre: 1.34 L
FEV1FVC-%Change-Post: 5 %
FEV1FVC-%Pred-Pre: 89 %
FEV6-%Change-Post: 4 %
FEV6-%Pred-Post: 56 %
FEV6-%Pred-Pre: 54 %
FEV6-Post: 1.92 L
FEV6-Pre: 1.84 L
FEV6FVC-%Change-Post: 0 %
FEV6FVC-%Pred-Post: 102 %
FEV6FVC-%Pred-Pre: 101 %
FVC-%Change-Post: 5 %
FVC-%Pred-Post: 56 %
FVC-%Pred-Pre: 53 %
FVC-Post: 1.95 L
FVC-Pre: 1.85 L
Post FEV1/FVC ratio: 77 %
Post FEV6/FVC ratio: 100 %
Pre FEV1/FVC ratio: 73 %
Pre FEV6/FVC Ratio: 99 %
RV % pred: 163 %
RV: 2.75 L
TLC % pred: 104 %
TLC: 5.14 L

## 2021-08-15 NOTE — Patient Instructions (Signed)
Full PFT Performed Today  

## 2021-08-15 NOTE — Patient Instructions (Addendum)
Continue on Pulmicort nebulizer twice daily Continue on Stiolto 2 puffs daily Continue singulair 10 mg daily Stop oxygen You'll be called to schedule CT Chest and pulmonary rehab Follow-up in 6 months or sooner if need be!

## 2021-08-15 NOTE — Progress Notes (Signed)
Full PFT Performed Today  

## 2021-08-17 ENCOUNTER — Other Ambulatory Visit: Payer: Self-pay | Admitting: Hematology & Oncology

## 2021-08-17 ENCOUNTER — Ambulatory Visit (HOSPITAL_COMMUNITY)
Admission: RE | Admit: 2021-08-17 | Discharge: 2021-08-17 | Disposition: A | Discharge status: Home / Self Care | Payer: No Typology Code available for payment source | Source: Ambulatory Visit | Attending: Hematology & Oncology | Admitting: Hematology & Oncology

## 2021-08-17 DIAGNOSIS — C7A8 Other malignant neuroendocrine tumors: Secondary | ICD-10-CM | POA: Insufficient documentation

## 2021-08-17 MED ORDER — COPPER CU 64 DOTATATE 1 MCI/ML IV SOLN
4.0000 | Freq: Once | INTRAVENOUS | Status: AC
  Administered 2021-08-17: 4 via INTRAVENOUS

## 2021-08-22 ENCOUNTER — Encounter (HOSPITAL_COMMUNITY): Payer: Self-pay

## 2021-08-22 ENCOUNTER — Telehealth (HOSPITAL_COMMUNITY): Payer: Self-pay

## 2021-08-23 ENCOUNTER — Encounter: Payer: Self-pay | Admitting: Hematology & Oncology

## 2021-08-23 ENCOUNTER — Inpatient Hospital Stay: Payer: No Typology Code available for payment source

## 2021-08-23 ENCOUNTER — Inpatient Hospital Stay: Payer: No Typology Code available for payment source | Admitting: Hematology & Oncology

## 2021-08-23 VITALS — BP 136/73 | HR 90 | Temp 97.9°F | Resp 20 | Ht 63.0 in | Wt 169.4 lb

## 2021-08-23 DIAGNOSIS — C7A8 Other malignant neuroendocrine tumors: Secondary | ICD-10-CM

## 2021-08-23 DIAGNOSIS — C7A010 Malignant carcinoid tumor of the duodenum: Secondary | ICD-10-CM | POA: Diagnosis not present

## 2021-08-23 DIAGNOSIS — K591 Functional diarrhea: Secondary | ICD-10-CM

## 2021-08-23 LAB — CBC WITH DIFFERENTIAL (CANCER CENTER ONLY)
Abs Immature Granulocytes: 0.03 10*3/uL (ref 0.00–0.07)
Basophils Absolute: 0 10*3/uL (ref 0.0–0.1)
Basophils Relative: 0 %
Eosinophils Absolute: 0.2 10*3/uL (ref 0.0–0.5)
Eosinophils Relative: 1 %
HCT: 37.8 % (ref 36.0–46.0)
Hemoglobin: 11.9 g/dL — ABNORMAL LOW (ref 12.0–15.0)
Immature Granulocytes: 0 %
Lymphocytes Relative: 40 %
Lymphs Abs: 4.3 10*3/uL — ABNORMAL HIGH (ref 0.7–4.0)
MCH: 26.5 pg (ref 26.0–34.0)
MCHC: 31.5 g/dL (ref 30.0–36.0)
MCV: 84.2 fL (ref 80.0–100.0)
Monocytes Absolute: 0.5 10*3/uL (ref 0.1–1.0)
Monocytes Relative: 5 %
Neutro Abs: 5.7 10*3/uL (ref 1.7–7.7)
Neutrophils Relative %: 54 %
Platelet Count: 375 10*3/uL (ref 150–400)
RBC: 4.49 MIL/uL (ref 3.87–5.11)
RDW: 14.7 % (ref 11.5–15.5)
Smear Review: NORMAL
WBC Count: 10.7 10*3/uL — ABNORMAL HIGH (ref 4.0–10.5)
nRBC: 0 % (ref 0.0–0.2)

## 2021-08-23 LAB — CMP (CANCER CENTER ONLY)
ALT: 40 U/L (ref 0–44)
AST: 33 U/L (ref 15–41)
Albumin: 4.2 g/dL (ref 3.5–5.0)
Alkaline Phosphatase: 114 U/L (ref 38–126)
Anion gap: 11 (ref 5–15)
BUN: 8 mg/dL (ref 6–20)
CO2: 32 mmol/L (ref 22–32)
Calcium: 9.7 mg/dL (ref 8.9–10.3)
Chloride: 102 mmol/L (ref 98–111)
Creatinine: 0.63 mg/dL (ref 0.44–1.00)
GFR, Estimated: >60 mL/min (ref >60)
Glucose, Bld: 113 mg/dL — ABNORMAL HIGH (ref 70–99)
Potassium: 3.2 mmol/L — ABNORMAL LOW (ref 3.5–5.1)
Sodium: 145 mmol/L (ref 135–145)
Total Bilirubin: 0.8 mg/dL (ref 0.3–1.2)
Total Protein: 7.3 g/dL (ref 6.5–8.1)

## 2021-08-23 LAB — MAGNESIUM: Magnesium: 1.6 mg/dL — ABNORMAL LOW (ref 1.7–2.4)

## 2021-08-23 MED ORDER — LANREOTIDE ACETATE 120 MG/0.5ML ~~LOC~~ SOLN
120.0000 mg | Freq: Once | SUBCUTANEOUS | Status: AC
  Administered 2021-08-23: 120 mg via SUBCUTANEOUS
  Filled 2021-08-23: qty 120

## 2021-08-24 ENCOUNTER — Encounter: Payer: Self-pay | Admitting: *Deleted

## 2021-08-24 ENCOUNTER — Telehealth (HOSPITAL_COMMUNITY): Payer: Self-pay | Admitting: *Deleted

## 2021-08-24 LAB — CHROMOGRANIN A: Chromogranin A (ng/mL): 28.4 ng/mL (ref 0.0–101.8)

## 2021-08-24 NOTE — Telephone Encounter (Signed)
Called Cassidy Tran in the process of determination if she was appropriate for scheduling for  pulmonary rehab. I needed more information and reached out to her. I was unable to  reach her and mailbox was full and I was unable to leave a message.

## 2021-09-13 ENCOUNTER — Ambulatory Visit
Admission: RE | Admit: 2021-09-13 | Discharge: 2021-09-13 | Disposition: A | Discharge status: Home / Self Care | Payer: No Typology Code available for payment source | Source: Ambulatory Visit | Attending: Student | Admitting: Student

## 2021-09-13 DIAGNOSIS — J849 Interstitial pulmonary disease, unspecified: Secondary | ICD-10-CM

## 2021-09-22 ENCOUNTER — Other Ambulatory Visit: Payer: Self-pay

## 2021-09-22 ENCOUNTER — Inpatient Hospital Stay: Payer: No Typology Code available for payment source | Attending: Hematology & Oncology

## 2021-09-22 ENCOUNTER — Encounter: Payer: Self-pay | Admitting: Hematology & Oncology

## 2021-09-22 ENCOUNTER — Inpatient Hospital Stay (HOSPITAL_BASED_OUTPATIENT_CLINIC_OR_DEPARTMENT_OTHER): Payer: No Typology Code available for payment source | Admitting: Hematology & Oncology

## 2021-09-22 ENCOUNTER — Inpatient Hospital Stay: Payer: No Typology Code available for payment source

## 2021-09-22 VITALS — BP 152/82 | HR 89 | Temp 98.4°F | Resp 18 | Ht 63.0 in | Wt 170.8 lb

## 2021-09-22 DIAGNOSIS — R197 Diarrhea, unspecified: Secondary | ICD-10-CM | POA: Insufficient documentation

## 2021-09-22 DIAGNOSIS — D6800 Von Willebrand disease, unspecified: Secondary | ICD-10-CM

## 2021-09-22 DIAGNOSIS — C7A8 Other malignant neuroendocrine tumors: Secondary | ICD-10-CM

## 2021-09-22 DIAGNOSIS — Z79899 Other long term (current) drug therapy: Secondary | ICD-10-CM | POA: Diagnosis not present

## 2021-09-22 LAB — CMP (CANCER CENTER ONLY)
ALT: 21 U/L (ref 0–44)
AST: 22 U/L (ref 15–41)
Albumin: 4.3 g/dL (ref 3.5–5.0)
Alkaline Phosphatase: 128 U/L — ABNORMAL HIGH (ref 38–126)
Anion gap: 12 (ref 5–15)
BUN: 10 mg/dL (ref 6–20)
CO2: 34 mmol/L — ABNORMAL HIGH (ref 22–32)
Calcium: 8.8 mg/dL — ABNORMAL LOW (ref 8.9–10.3)
Chloride: 99 mmol/L (ref 98–111)
Creatinine: 0.88 mg/dL (ref 0.44–1.00)
GFR, Estimated: >60 mL/min (ref >60)
Glucose, Bld: 110 mg/dL — ABNORMAL HIGH (ref 70–99)
Potassium: 2.8 mmol/L — ABNORMAL LOW (ref 3.5–5.1)
Sodium: 145 mmol/L (ref 135–145)
Total Bilirubin: 0.8 mg/dL (ref 0.3–1.2)
Total Protein: 7.3 g/dL (ref 6.5–8.1)

## 2021-09-22 LAB — CBC WITH DIFFERENTIAL (CANCER CENTER ONLY)
Abs Immature Granulocytes: 0.04 10*3/uL (ref 0.00–0.07)
Basophils Absolute: 0.1 10*3/uL (ref 0.0–0.1)
Basophils Relative: 0 %
Eosinophils Absolute: 0.2 10*3/uL (ref 0.0–0.5)
Eosinophils Relative: 2 %
HCT: 38.2 % (ref 36.0–46.0)
Hemoglobin: 12.3 g/dL (ref 12.0–15.0)
Immature Granulocytes: 0 %
Lymphocytes Relative: 33 %
Lymphs Abs: 4.1 10*3/uL — ABNORMAL HIGH (ref 0.7–4.0)
MCH: 26.3 pg (ref 26.0–34.0)
MCHC: 32.2 g/dL (ref 30.0–36.0)
MCV: 81.6 fL (ref 80.0–100.0)
Monocytes Absolute: 0.5 10*3/uL (ref 0.1–1.0)
Monocytes Relative: 4 %
Neutro Abs: 7.6 10*3/uL (ref 1.7–7.7)
Neutrophils Relative %: 61 %
Platelet Count: 367 10*3/uL (ref 150–400)
RBC: 4.68 MIL/uL (ref 3.87–5.11)
RDW: 15.5 % (ref 11.5–15.5)
Smear Review: NORMAL
WBC Count: 12.5 10*3/uL — ABNORMAL HIGH (ref 4.0–10.5)
nRBC: 0 % (ref 0.0–0.2)

## 2021-09-22 LAB — MAGNESIUM: Magnesium: 1.3 mg/dL — ABNORMAL LOW (ref 1.7–2.4)

## 2021-09-22 MED ORDER — BELSOMRA 10 MG PO TABS: 10.0000 mg | ORAL_TABLET | Freq: Every day | ORAL | 0 refills | Status: DC

## 2021-09-22 MED ORDER — MAGNESIUM SULFATE 2 GM/50ML IV SOLN: 2.0000 g | Freq: Once | INTRAVENOUS | Status: DC

## 2021-09-22 MED ORDER — LANREOTIDE ACETATE 120 MG/0.5ML ~~LOC~~ SOLN: 120.0000 mg | Freq: Once | SUBCUTANEOUS | Status: AC

## 2021-09-22 MED ORDER — PB-HYOSCY-ATROPINE-SCOPOLAMINE 16.2 MG/5ML PO ELIX: 5.0000 mL | ORAL_SOLUTION | Freq: Four times a day (QID) | ORAL | 2 refills | Status: DC

## 2021-09-22 MED ORDER — POTASSIUM CHLORIDE 10 MEQ/100ML IV SOLN: 10.0000 meq | INTRAVENOUS | Status: AC

## 2021-09-22 NOTE — Progress Notes (Signed)
Hematology and Oncology Follow Up Visit  Cassidy Tran [id number] 1975-08-23 46 y.o. 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 is back for follow-up.  She still has the same problems as she has had before.  The diarrhea is a real issue for her.  She is on Imodium.  She is taking Lomotil.  She is on Questran.  She is on Creon.  She thinks that the Creon may help the best.  We could certainly double the Creon to 2 pills 3 times a day.  Most anterior abdomen, I really do not hear a lot of bowel sounds.  I am unsure exactly what this means but there is concern he implied that she just does not have a good absorption.  We will try her on some Donnatal elixir.  This may be helpful.  Unfortunately, I am unsure if insurance will cover this.  Was amazing is at her Chromogranin A level is normal now.  We checked it back in June, it was down to 28.  As such, we will stop the Somatuline.  She is not sleeping well.  We will try her on some Belsomra to see this may help with her sleep.  She has some tingling in the hands and feet.  She has some hair loss.  I will know if this might be from low potassium and magnesium.  We will have to replace these.  She is on some potassium at home.  I am sure that the diarrhea is affecting this.  She probably needs to see Gastroenterology again to try to help with the diarrhea.  Unfortunately, surgery is no longer able to really help her out.  She has had no seizures.  She is on Keppra for this.  Thankfully, she is now off of her physical therapy.  Currently, I would say performance status is probably ECOG 1.    Medications:  Current Outpatient Medications:    acetaminophen (TYLENOL) 325 MG  tablet, Take 1-2 tablets (325-650 mg total) by mouth every 4 (four) hours as needed for mild pain., Disp: , Rfl:    albuterol (VENTOLIN HFA) 108 (90 Base) MCG/ACT inhaler, Inhale 2 puffs into the lungs every 6 (six) hours as needed., Disp: , Rfl:    budesonide (PULMICORT) 0.5 MG/2ML nebulizer solution, Take 1 mL (0.25 mg total) by nebulization 2 (two) times daily., Disp: 60 mL, Rfl: 0   carvedilol (COREG) 3.125 MG tablet, Take 1 tablet (3.125 mg total) by mouth 2 (two) times daily with a meal., Disp: 60 tablet, Rfl: 0   cholestyramine (QUESTRAN) 4 g packet, Take 0.5 packets (2 g total) by mouth daily., Disp: 30 each, Rfl: 0   diphenoxylate-atropine (LOMOTIL) 2.5-0.025 MG tablet, Take 2 tablets by mouth 4 (four) times daily. (Patient taking differently: Take 2 tablets by mouth 4 (four) times daily as needed.), Disp: 30 tablet, Rfl: 0   gabapentin (NEURONTIN) 100 MG capsule, Take 1 capsule (100 mg total) by mouth 3 (three) times daily., Disp: 270 capsule, Rfl: 3   levETIRAcetam (KEPPRA) 750 MG tablet, Take 1 tablet (750 mg total) by mouth 2 (two) times daily., Disp: 180 tablet, Rfl: 3   lipase/protease/amylase (CREON) 36000 UNITS CPEP capsule, Take 2 capsules (72,000 Units total)  by mouth 3 (three) times daily with meals. May also take 1 capsule (36,000 Units total) as needed (with snacks)., Disp: 240 capsule, Rfl: 11   loperamide (IMODIUM A-D) 2 MG tablet, Take 2 mg by mouth 4 (four) times daily as needed for diarrhea or loose stools., Disp: , Rfl:    methocarbamol (ROBAXIN) 750 MG tablet, Take 750 mg by mouth every 6 (six) hours as needed., Disp: , Rfl:    mometasone (NASONEX) 50 MCG/ACT nasal spray, Use one to two sprays in each nostril once daily, Disp: 17 g, Rfl: 5   montelukast (SINGULAIR) 10 MG tablet, TAKE 1 TABLET BY MOUTH AT BEDTIME, Disp: 30 tablet, Rfl: 0   Multiple Vitamin (MULTIVITAMIN WITH MINERALS) TABS tablet, Take 1 tablet by mouth daily., Disp: , Rfl:    pantoprazole (PROTONIX) 40 MG  tablet, Take 1 tablet (40 mg total) by mouth daily., Disp: 30 tablet, Rfl: 1   potassium chloride SA (KLOR-CON M) 20 MEQ tablet, Take 2 tablets (40 mEq total) by mouth 2 (two) times daily. Take 2 tablets twice a day for 7 days and then take 1 tablet twice a day, Disp: 120 tablet, Rfl: 2   Tiotropium Bromide-Olodaterol (STIOLTO RESPIMAT) 2.5-2.5 MCG/ACT AERS, Inhale 2 puffs into the lungs daily., Disp: 4 g, Rfl: 0   tretinoin (RETIN-A) 0.05 % cream, Apply 1 application  topically at bedtime as needed (acne)., Disp: , Rfl:    zonisamide (ZONEGRAN) 100 MG capsule, Take 1 capsule (100 mg total) by mouth 2 (two) times daily., Disp: 180 capsule, Rfl: 3  Allergies:  Allergies  Allergen Reactions   Aspirin Other (See Comments)    Childhood reaction   Codeine Nausea And Vomiting    Tolerates Hydrocodone   Erythromycin Other (See Comments)    Childhood reaction, tolerate Zpak   Lactose Intolerance (Gi) Diarrhea   Nsaids Nausea And Vomiting   Penicillins Other (See Comments)    Has patient had a PCN reaction causing immediate rash, facial/tongue/throat swelling, SOB or lightheadedness with hypotension: doesn't remember childhood Reaction  Has patient had a PCN reaction causing severe rash involving mucus membranes or skin necrosis: NO Has patient had a PCN reaction that required hospitalization NO Has patient had a PCN reaction occurring within the last 10 years: NO If all of the above answers are "NO", then may proceed with Cephalosporin use.     Past Medical History, Surgical history, Social history, and Family History were reviewed and updated.  Review of Systems: Review of Systems  Constitutional:  Positive for fatigue and unexpected weight change.  HENT:  Negative.    Eyes: Negative.   Respiratory: Negative.    Cardiovascular: Negative.   Gastrointestinal:  Positive for diarrhea.  Endocrine: Negative.   Genitourinary: Negative.    Musculoskeletal: Negative.   Skin: Negative.    Neurological: Negative.   Hematological: Negative.   Psychiatric/Behavioral: Negative.      Physical Exam:  height is '5\' 3"'$  (1.6 m) and weight is 170 lb 12.8 oz (77.5 kg). Her oral temperature is 98.4 F (36.9 C). Her blood pressure is 152/82 (abnormal) and her pulse is 89. Her respiration is 18 and oxygen saturation is 98%.   Wt Readings from Last 3 Encounters:  09/22/21 170 lb 12.8 oz (77.5 kg)  08/23/21 169 lb 6.4 oz (76.8 kg)  08/15/21 170 lb 12.8 oz (77.5 kg)    Physical Exam Vitals reviewed.  HENT:     Head: Normocephalic and atraumatic.  Eyes:  Pupils: Pupils are equal, round, and reactive to light.  Cardiovascular:     Rate and Rhythm: Normal rate and regular rhythm.     Heart sounds: Normal heart sounds.     Comments: Cardiac exam shows a regular rate and rhythm with no murmurs, rubs or bruits. Pulmonary:     Effort: Pulmonary effort is normal.     Breath sounds: Normal breath sounds.     Comments: Her lungs sound clear bilaterally.  I hear no wheezing. Abdominal:     General: Bowel sounds are normal.     Palpations: Abdomen is soft.     Comments: Abdominal exam shows soft abdomen.  She has a laparotomy scar that is healed.  This is in the midline.  She has no fluid wave.  Bowel sounds are quite low.  There is no guarding or rebound tenderness.  There is no palpable liver or spleen tip.  Musculoskeletal:        General: No tenderness or deformity. Normal range of motion.     Cervical back: Normal range of motion.  Lymphadenopathy:     Cervical: No cervical adenopathy.  Skin:    General: Skin is warm and dry.     Findings: No erythema or rash.  Neurological:     Mental Status: She is alert and oriented to person, place, and time.  Psychiatric:        Behavior: Behavior normal.        Thought Content: Thought content normal.        Judgment: Judgment normal.      Lab Results  Component Value Date   WBC 10.7 (H) 08/23/2021   HGB 11.9 (L) 08/23/2021    HCT 37.8 08/23/2021   MCV 84.2 08/23/2021   PLT 375 08/23/2021     Chemistry      Component Value Date/Time   NA 145 09/22/2021 1440   K 2.8 (L) 09/22/2021 1440   CL 99 09/22/2021 1440   CO2 34 (H) 09/22/2021 1440   BUN 10 09/22/2021 1440   CREATININE 0.88 09/22/2021 1440   CREATININE 0.73 01/26/2020 1006      Component Value Date/Time   CALCIUM 8.8 (L) 09/22/2021 1440   ALKPHOS 128 (H) 09/22/2021 1440   AST 22 09/22/2021 1440   ALT 21 09/22/2021 1440   BILITOT 0.8 09/22/2021 1440      Impression and Plan: Ms. Benway is a very nice 46 year old white female.  She has von Willebrand disease.  Her problem now that she has carcinoid.  She apparently had this resected.  Initial chromogranin A level was quite high at 463.  We gave her a dose of Somatuline.  The follow-up Chromogranin A level was normal at 28.  Again, her big problem is this diarrhea.  When not sure how much else we can do to help this.  We will try the increase in Creon.  We will try the Donnatal.  I do think she is going to have to see Gastroenterology.    Her magnesium is quite low.  Lets give some IV magnesium.  This may also help with her potassium.  Again, we are going to hold the Somatuline.  We will see if the Belsomra can help with her sleep.  I just want her quality of life to get better.  I just feel bad that she is having a tough time.  It has been a real challenge ever since she had surgery for this carcinoid back in March.  I would like to see her back in about a month or so to see if she feels any better.    Volanda Napoleon, MD 7/27/20233:17 PM

## 2021-09-23 LAB — LACTATE DEHYDROGENASE: LDH: 288 U/L — ABNORMAL HIGH (ref 98–192)

## 2021-09-26 LAB — CHROMOGRANIN A: Chromogranin A (ng/mL): <14.6 ng/mL (ref 0.0–101.8)

## 2021-09-27 ENCOUNTER — Other Ambulatory Visit (HOSPITAL_COMMUNITY): Payer: Self-pay

## 2021-09-27 ENCOUNTER — Other Ambulatory Visit: Payer: Self-pay

## 2021-09-27 ENCOUNTER — Telehealth: Payer: Self-pay

## 2021-09-27 NOTE — Telephone Encounter (Signed)
Called and spoke with patient to schedule a follow up appt. Pt has been scheduled for a follow up appt with Vicie Mutters, PA-C on Monday, 10/10/21 at 1:30 pm. I provided pt with the office address. Pt verbalized understanding and had no concerns at the end of the call.

## 2021-09-27 NOTE — Telephone Encounter (Signed)
-----   Message from [place], DO sent at 09/26/2021  5:06 PM EDT ----- Continue please try to get an appointment with me or one of the APP's for evaluation of diarrhea in the setting of neuroendocrine tumor.  Thank you.   ----- Message ----- From: Volanda Napoleon, MD Sent: 09/22/2021   5:42 PM EDT To: Lavena Bullion, DO  Cassidy Tran: Cassidy Tran is still having a lot of problems with diarrhea.  I am just not sure how else I can help her out.  I do not think this is the carcinoid since her Chromogranin A level is normal.  I am increasing her Creon.  I will try her on some Donnatal which may help.  When I I listened to her today, I do not hear a lot of bowel sounds.  If you could really help her out, this really is affecting her quality of life.  I hope that you are having a good Summer with your family.  I am sure that you have been on vacation.  Cassidy Tran

## 2021-09-29 ENCOUNTER — Inpatient Hospital Stay: Payer: No Typology Code available for payment source | Attending: Hematology & Oncology

## 2021-09-29 DIAGNOSIS — D6801 Von willebrand disease, type 1: Secondary | ICD-10-CM | POA: Diagnosis not present

## 2021-09-29 DIAGNOSIS — R197 Diarrhea, unspecified: Secondary | ICD-10-CM | POA: Diagnosis not present

## 2021-09-29 DIAGNOSIS — C7A012 Malignant carcinoid tumor of the ileum: Secondary | ICD-10-CM | POA: Diagnosis not present

## 2021-09-29 MED ORDER — SODIUM CHLORIDE 0.9 % IV SOLN: INTRAVENOUS | Status: DC

## 2021-09-29 MED ORDER — MAGNESIUM SULFATE 2 GM/50ML IV SOLN
2.0000 g | Freq: Once | INTRAVENOUS | Status: AC
  Administered 2021-09-29: 2 g via INTRAVENOUS
  Filled 2021-09-29: qty 50

## 2021-09-29 NOTE — Patient Instructions (Signed)
Magnesium Sulfate Injection What is this medication? MAGNESIUM SULFATE (mag NEE zee um SUL fate) prevents and treats low levels of magnesium in your body. It may also be used to prevent and treat seizures during pregnancy in people with high blood pressure disorders, such as preeclampsia or eclampsia. Magnesium plays an important role in maintaining the health of your muscles and nervous system. This medicine may be used for other purposes; ask your health care provider or pharmacist if you have questions. What should I tell my care team before I take this medication? They need to know if you have any of these conditions: Heart disease History of irregular heart beat Kidney disease An unusual or allergic reaction to magnesium sulfate, medications, foods, dyes, or preservatives Pregnant or trying to get pregnant Breast-feeding How should I use this medication? This medication is for infusion into a vein. It is given in a hospital or clinic setting. Talk to your care team about the use of this medication in children. While this medication may be prescribed for selected conditions, precautions do apply. Overdosage: If you think you have taken too much of this medicine contact a poison control center or emergency room at once. NOTE: This medicine is only for you. Do not share this medicine with others. What if I miss a dose? This does not apply. What may interact with this medication? Certain medications for anxiety or sleep Certain medications for seizures like phenobarbital Digoxin Medications that relax muscles for surgery Narcotic medications for pain This list may not describe all possible interactions. Give your health care provider a list of all the medicines, herbs, non-prescription drugs, or dietary supplements you use. Also tell them if you smoke, drink alcohol, or use illegal drugs. Some items may interact with your medicine. What should I watch for while using this medication? Your  condition will be monitored carefully while you are receiving this medication. You may need blood work done while you are receiving this medication. What side effects may I notice from receiving this medication? Side effects that you should report to your care team as soon as possible: Allergic reactions--skin rash, itching, hives, swelling of the face, lips, tongue, or throat High magnesium level--confusion, drowsiness, facial flushing, redness, sweating, muscle weakness, fast or irregular heartbeat, trouble breathing Low blood pressure--dizziness, feeling faint or lightheaded, blurry vision Side effects that usually do not require medical attention (report to your care team if they continue or are bothersome): Headache Nausea This list may not describe all possible side effects. Call your doctor for medical advice about side effects. You may report side effects to FDA at 1-800-FDA-1088. Where should I keep my medication? This medication is given in a hospital or clinic and will not be stored at home. NOTE: This sheet is a summary. It may not cover all possible information. If you have questions about this medicine, talk to your doctor, pharmacist, or health care provider.  2023 Elsevier/Gold Standard (2020-04-22 00:00:00)  

## 2021-10-04 ENCOUNTER — Encounter: Payer: Self-pay | Admitting: Neurology

## 2021-10-07 NOTE — Progress Notes (Unsigned)
10/10/2021 Cassidy Tran [id number] [birth year]  Referring provider: Janie Morning, DO Primary GI doctor: Dr. Bryan Lemma  ASSESSMENT AND PLAN:   Assessment: 46 y.o. female here for assessment of the following: 1. Diarrhea of presumed infectious origin   2. Neuroendocrine carcinoma ([place])   3. S/P right hemicolectomy   4. History of Clostridioides difficile colitis    46 year old female with diarrhea since 2021, found to have Cdiff.  Neuroendocrine found on colonoscopy 02/2021 s/p small bowel resection and right hemicolectomy, Follow up chromogranin less than 14, recent pet unremarkable.  Creon helps some, recent increase in dose.  No fever, chills, AB pain, has had consistent leukocytosis.  Significant hypomagnesia and hypokalemia with quality of life being affected.   Plan: Will try colestipol pills, up to 4 grams times a day, start with 2 grams twice a day.  Recheck Cdiff with elevated WBC and previous infection.  Continue creon, can potentially increase further.  Low fat, frequent meals.  Can consider SIBO testing/treatment if negative.  Ultimately if everything is negative may need reevaluation with colonoscopy for microscopic colitis.  Can add fiber.    Orders Placed This Encounter  Procedures   Clostridium difficile Toxin B, Qualitative, Real-Time PCR   GI Profile, Stool, PCR   Fecal fat, qualitative    Meds ordered this encounter  Medications   colestipol (COLESTID) 1 g tablet    Sig: Can take 1-2 pills twice a day for diarrhea    Dispense:  60 tablet    Refill:  2       Patient Care Team: Janie Morning, DO as PCP - General (Family Medicine)  HISTORY OF PRESENT ILLNESS: 46 y.o. female with a past medical history of anxiety, asthma, depression, endometriosis, seizure disorder, von Willebrand disease, tobacco use, hysterectomy, Chiari malformation s/p VP shunt 2018and others listed below presents for evaluation of diarrhea in setting of known  neuroendocrine tumor.  Patient status postcholecystectomy   12/16/2020 office visit with Dr. Bryan Lemma for multitude of GI symptoms.  Had unremarkable CBC, CMET, thyroid, CRP, ANA, fecal calprotectin, pancreatic elastase.   Patient did test positive for C. difficile, treated with vancomycin. 03/11/2021 colonoscopy showed perianal skin tags, 15 mm polyp in the rectum, nonbleeding angiodysplastic lesion, internal hemorrhoid polypoid lesion terminal ileum  PATH:" well-differentiated neuroendocrine grade 1 tumor in the ileum negative macroscopic colitis. Rectal polyp tubulovillous adenoma without dysplasia- recall 3 years 03/11/2021 endoscopy showed LA grade a reflux esophagitis single bleeding angiectasia in stomach status post MR clip, hematin in the gastric fundus and body, mild nonulcer gastritis, normal duodenum. Negative celiac, negative H. pylori,  Referred to colorectal surgeon, subsequent right hemicolectomy, cholecystectomy with ileocolic anastomosis on 76/09/1155 with Dr. Bobbye Morton.   Small bowel resection was performed 50 cm proximal to the ileocecal valve, right hemicolectomy was performed.  Prolonged hospital course including trach/PEG ultimately DC to rehab facility.   She follows with Dr. Marin Olp.  Initial chromogranin A level was quite high at 463.  S/p one dose of somatuline The follow-up Chromogranin A level was normal at 28.  Patient's had longstanding diarrhea with hypokalemia and hypomagnesia,.  09/22/2021 magnesium 1.3, LDH 288, white blood cell count 12.5, no anemia, platelets 367, chromogranin A less than 14, potassium 2.8, calcium 8.8, alk phos 128, AST 22, ALT 21  She states she had diarrhea 6-8 months prior to being seen for colonoscopy. She was treated for Cdiff. States she has had diarrhea since prior to being seen, has not gotten worse since  the surgery.  Patient complains of having 15 bowel movements daily that disrupt her life. Will have nocturnal awakenings, rare  incontinence.  Worse with food/eating, no difference with which food.  Has urgency with it, can range from pure water to mud pie.  No shine to it, no oily appearance.  She is not having AB discomfort or any fever, chills.  She has had 2 nights of vomiting, rare. No nausea.  Occ bloating but rare.   On Creon 36,000, 2 pills with meals up to 3 times daily, 1 with snack, just increased 2 weeks ago and has helped some.  Cholestyramine did not help, "1 scoop". , Lomotil she is on 2 pills up to 4 times a day, Imodium 2 pills up to 2-3 times a day.. She is on potassium pills, she is on magnesium gummies.  She has had 2 magnesium infusion.    Current Medications:     Facility-Administered Medications Ordered in Other Visits (Endocrine & Metabolic):    lanreotide acetate (SOMATULINE DEPOT) injection 120 mg  Current Outpatient Medications (Cardiovascular):    carvedilol (COREG) 3.125 MG tablet, Take 1 tablet (3.125 mg total) by mouth 2 (two) times daily with a meal.   cholestyramine (QUESTRAN) 4 g packet, Take 0.5 packets (2 g total) by mouth daily.   colestipol (COLESTID) 1 g tablet, Can take 1-2 pills twice a day for diarrhea   Current Outpatient Medications (Respiratory):    albuterol (VENTOLIN HFA) 108 (90 Base) MCG/ACT inhaler, Inhale 2 puffs into the lungs every 6 (six) hours as needed.   budesonide (PULMICORT) 0.5 MG/2ML nebulizer solution, Take 1 mL (0.25 mg total) by nebulization 2 (two) times daily.   mometasone (NASONEX) 50 MCG/ACT nasal spray, Use one to two sprays in each nostril once daily   montelukast (SINGULAIR) 10 MG tablet, TAKE 1 TABLET BY MOUTH AT BEDTIME   Tiotropium Bromide-Olodaterol (STIOLTO RESPIMAT) 2.5-2.5 MCG/ACT AERS, Inhale 2 puffs into the lungs daily.   Current Outpatient Medications (Analgesics):    acetaminophen (TYLENOL) 325 MG tablet, Take 1-2 tablets (325-650 mg total) by mouth every 4 (four) hours as needed for mild pain.     Current Outpatient  Medications (Other):    diphenoxylate-atropine (LOMOTIL) 2.5-0.025 MG tablet, Take 2 tablets by mouth 4 (four) times daily. (Patient taking differently: Take 2 tablets by mouth 4 (four) times daily as needed.)   gabapentin (NEURONTIN) 100 MG capsule, Take 1 capsule (100 mg total) by mouth 3 (three) times daily.   levETIRAcetam (KEPPRA) 750 MG tablet, Take 1 tablet (750 mg total) by mouth 2 (two) times daily.   lipase/protease/amylase (CREON) 36000 UNITS CPEP capsule, Take 2 capsules (72,000 Units total) by mouth 3 (three) times daily with meals. May also take 1 capsule (36,000 Units total) as needed (with snacks).   loperamide (IMODIUM A-D) 2 MG tablet, Take 2 mg by mouth 4 (four) times daily as needed for diarrhea or loose stools.   MAGNESIUM PO, Take 70 mg by mouth 2 (two) times daily. GUMMIES   methocarbamol (ROBAXIN) 750 MG tablet, Take 750 mg by mouth every 6 (six) hours as needed.   Multiple Vitamin (MULTIVITAMIN WITH MINERALS) TABS tablet, Take 1 tablet by mouth daily.   pantoprazole (PROTONIX) 40 MG tablet, Take 1 tablet (40 mg total) by mouth daily.   potassium chloride SA (KLOR-CON M) 20 MEQ tablet, Take 2 tablets (40 mEq total) by mouth 2 (two) times daily. Take 2 tablets twice a day for 7 days and then take  1 tablet twice a day   tretinoin (RETIN-A) 0.05 % cream, Apply 1 application  topically at bedtime as needed (acne).   zonisamide (ZONEGRAN) 100 MG capsule, Take 1 capsule (100 mg total) by mouth 2 (two) times daily.   belladonna-PHENObarbital (DONNATAL) 16.2 MG/5ML ELIX, Take 5 mLs (16.2 mg total) by mouth 4 (four) times daily. (Patient not taking: Reported on 10/10/2021)   Suvorexant (BELSOMRA) 10 MG TABS, Take 10 mg by mouth at bedtime. (Patient not taking: Reported on 10/10/2021)  No current facility-administered medications for this visit.  Medical History:  Past Medical History:  Diagnosis Date   Acid reflux    Allergy    Anxiety    Arthritis    lower back   ASCUS  (atypical squamous cells of undetermined significance) on Pap smear 03/2011   NEG HR HPV   Asthma    Cancer (HCC)    skin cancer- age 18ish   Cervical dysplasia, mild 08/2010   LGSIL colposcopy biopsy showing koilocytotic atypia   Clotting disorder ([place])    Depression    Endometriosis    Headache(784.0)    High risk HPV infection 12/2011   Pap normal   Hypertension    Pneumonia    2016ish   Seizures (HCC)    seizure disorder   Smoker    Von Willebrand disease ([place])    Allergies:  Allergies  Allergen Reactions   Aspirin Other (See Comments)    Childhood reaction   Codeine Nausea And Vomiting    Tolerates Hydrocodone   Erythromycin Other (See Comments)    Childhood reaction, tolerate Zpak   Lactose Intolerance (Gi) Diarrhea   Nsaids Nausea And Vomiting   Penicillins Other (See Comments)    Has patient had a PCN reaction causing immediate rash, facial/tongue/throat swelling, SOB or lightheadedness with hypotension: doesn't remember childhood Reaction  Has patient had a PCN reaction causing severe rash involving mucus membranes or skin necrosis: NO Has patient had a PCN reaction that required hospitalization NO Has patient had a PCN reaction occurring within the last 10 years: NO If all of the above answers are "NO", then may proceed with Cephalosporin use.      Surgical History:  She  has a past surgical history that includes Nasal sinus surgery (1993); Wisdom tooth extraction (1993); Tonsillectomy; Hysteroscopy with D & C (10/14/2010); laparoscopy (04/27/2011); Colposcopy; Dilation and curettage of uterus; Laparoscopic hysterectomy (N/A, 10/14/2012); Cystoscopy (N/A, 10/14/2012); Breast biopsy (Left); Suboccipital craniectomy cervical laminectomy (N/A, 11/06/2016); Ventriculoperitoneal shunt (N/A, 11/24/2016); Application of cranial navigation (N/A, 11/24/2016); Brain surgery; laparotomy (N/A, 05/05/2021); Colon resection (N/A, 05/05/2021); Cholecystectomy (N/A, 05/05/2021);  Tracheostomy tube placement (N/A, 05/30/2021); and PEG placement (N/A, 05/30/2021). Family History:  Her family history includes Cancer in her maternal uncle; Diabetes in her father; Heart disease in her father and mother; Hyperlipidemia in her father and mother; Hypertension in her father and mother; Lymphoma in her sister; Prostate cancer in her father; Stroke in her father. Social History:   reports that she quit smoking about 5 months ago. Her smoking use included cigarettes. She has a 10.00 pack-year smoking history. She has never used smokeless tobacco. She reports that she does not currently use alcohol. She reports that she does not use drugs.  REVIEW OF SYSTEMS  : All other systems reviewed and negative except where noted in the History of Present Illness.   PHYSICAL EXAM: BP (!) 144/92 (BP Location: Left Arm, Patient Position: Sitting, Cuff Size: Normal)   Pulse 88  Ht '5\' 3"'  (1.6 m)   Wt 176 lb 6 oz (80 kg)   LMP 09/16/2012   BMI 31.24 kg/m  General:   Pleasant, well developed female in no acute distress Head:   Normocephalic and atraumatic. Eyes:  sclerae anicteric,conjunctive pink  Heart:   regular rate and rhythm, no murmurs or gallops Pulm:  Clear anteriorly; no wheezing Abdomen:   Soft, Obese AB, Active bowel sounds. mild tenderness in the lower abdomen. Well healed vertical AB scars. Without guarding and Without rebound, No organomegaly appreciated. Rectal: Not evaluated Extremities:  Without edema. Msk: Symmetrical without gross deformities. Peripheral pulses intact.  Neurologic:  Alert and  oriented x4;  No focal deficits.  Skin:   Dry and intact without significant lesions or rashes. Psychiatric:  Cooperative. Normal mood and affect.    Vladimir Crofts, PA-C 2:24 PM

## 2021-10-10 ENCOUNTER — Other Ambulatory Visit: Payer: No Typology Code available for payment source

## 2021-10-10 ENCOUNTER — Telehealth: Payer: Self-pay | Admitting: Physician Assistant

## 2021-10-10 ENCOUNTER — Encounter: Payer: Self-pay | Admitting: Physician Assistant

## 2021-10-10 ENCOUNTER — Ambulatory Visit (INDEPENDENT_AMBULATORY_CARE_PROVIDER_SITE_OTHER): Payer: No Typology Code available for payment source | Admitting: Physician Assistant

## 2021-10-10 VITALS — BP 144/92 | HR 88 | Ht 63.0 in | Wt 176.4 lb

## 2021-10-10 DIAGNOSIS — Z8619 Personal history of other infectious and parasitic diseases: Secondary | ICD-10-CM

## 2021-10-10 DIAGNOSIS — R197 Diarrhea, unspecified: Secondary | ICD-10-CM | POA: Diagnosis not present

## 2021-10-10 DIAGNOSIS — Z9049 Acquired absence of other specified parts of digestive tract: Secondary | ICD-10-CM

## 2021-10-10 DIAGNOSIS — C7A8 Other malignant neuroendocrine tumors: Secondary | ICD-10-CM

## 2021-10-10 MED ORDER — COLESTIPOL HCL 1 G PO TABS: ORAL_TABLET | ORAL | 2 refills | Status: DC

## 2021-10-10 NOTE — Patient Instructions (Addendum)
If you are age 48 or younger, your body mass index should be between 19-25. Your Body mass index is 31.24 kg/m. If this is out of the aformentioned range listed, please consider follow up with your Primary Care Provider.   ________________________________________________________  The [facility] providers would like to encourage you to use [facility] to communicate with providers for non-urgent requests or questions.  Due to long hold times on the telephone, sending your provider a message by [facility] may be a faster and more efficient way to get a response.  Please allow 48 business hours for a response.  Please remember that this is for non-urgent requests.  _______________________________________________________ Your provider has requested that you go to the basement level for lab work before leaving today. Press "B" on the elevator. The lab is located at the first door on the left as you exit the elevator.  Can do trial of citracel or benfiber  We have sent the following medications to your pharmacy for you to pick up at your convenience: START: Colestid   Continue: creon  We may want to evaluate you for small intestinal bacterial overgrowth, this can cause increase gas, bloating, loose stools. There is a test for this we can do or sometimes we will treat a patient with an antibiotic to see if it helps.   You are scheduled to follow up on 12-15-21 at 11:20 with Dr Bryan Lemma.  Thank you for entrusting me with your care and choosing [facility].  Vicie Mutters, PA-C

## 2021-10-10 NOTE — Telephone Encounter (Signed)
[facility] pharmacy called regarding Colestipol 1g. Would like a call back on [phone].

## 2021-10-11 NOTE — Telephone Encounter (Signed)
Pharmacy wanted to verify that Colestipol was being used to treat diarrhea. Verified with pharmacy that Colestipol was being taken to treat diarrhea.

## 2021-10-17 ENCOUNTER — Telehealth: Payer: Self-pay | Admitting: *Deleted

## 2021-10-17 NOTE — Telephone Encounter (Signed)
Message received from patient stating that her insurance company stated they did not receive PA's and would like a call back. Call placed back to patient and patient states that she is awaiting PA on Donnatol and Belsomra.  Informed pt that I would look into it.  Call placed to [facility] and PA sent via fax to this office and then scanned to PA team for Belsomra and Donnatol. Pt notified.

## 2021-10-20 ENCOUNTER — Other Ambulatory Visit: Payer: Self-pay | Admitting: Hematology & Oncology

## 2021-10-20 MED ORDER — TRIAZOLAM 0.25 MG PO TABS: 0.2500 mg | ORAL_TABLET | Freq: Every evening | ORAL | 0 refills | Status: DC | PRN

## 2021-10-24 ENCOUNTER — Inpatient Hospital Stay: Payer: No Typology Code available for payment source

## 2021-10-24 ENCOUNTER — Telehealth: Payer: Self-pay | Admitting: Physician Assistant

## 2021-10-24 ENCOUNTER — Inpatient Hospital Stay (HOSPITAL_BASED_OUTPATIENT_CLINIC_OR_DEPARTMENT_OTHER): Payer: No Typology Code available for payment source | Admitting: Hematology & Oncology

## 2021-10-24 ENCOUNTER — Encounter: Payer: Self-pay | Admitting: Hematology & Oncology

## 2021-10-24 ENCOUNTER — Other Ambulatory Visit: Payer: Self-pay

## 2021-10-24 VITALS — BP 141/75 | HR 85 | Temp 98.0°F | Resp 18 | Ht 63.0 in | Wt 175.8 lb

## 2021-10-24 DIAGNOSIS — D6800 Von Willebrand disease, unspecified: Secondary | ICD-10-CM

## 2021-10-24 DIAGNOSIS — S31109A Unspecified open wound of abdominal wall, unspecified quadrant without penetration into peritoneal cavity, initial encounter: Secondary | ICD-10-CM | POA: Diagnosis not present

## 2021-10-24 DIAGNOSIS — C7A8 Other malignant neuroendocrine tumors: Secondary | ICD-10-CM | POA: Diagnosis not present

## 2021-10-24 DIAGNOSIS — C7A012 Malignant carcinoid tumor of the ileum: Secondary | ICD-10-CM | POA: Diagnosis not present

## 2021-10-24 LAB — CBC WITH DIFFERENTIAL (CANCER CENTER ONLY)
Abs Immature Granulocytes: 0.05 10*3/uL (ref 0.00–0.07)
Basophils Absolute: 0.1 10*3/uL (ref 0.0–0.1)
Basophils Relative: 0 %
Eosinophils Absolute: 0.2 10*3/uL (ref 0.0–0.5)
Eosinophils Relative: 2 %
HCT: 37.3 % (ref 36.0–46.0)
Hemoglobin: 12 g/dL (ref 12.0–15.0)
Immature Granulocytes: 0 %
Lymphocytes Relative: 34 %
Lymphs Abs: 4.3 10*3/uL — ABNORMAL HIGH (ref 0.7–4.0)
MCH: 27.6 pg (ref 26.0–34.0)
MCHC: 32.2 g/dL (ref 30.0–36.0)
MCV: 85.9 fL (ref 80.0–100.0)
Monocytes Absolute: 0.5 10*3/uL (ref 0.1–1.0)
Monocytes Relative: 4 %
Neutro Abs: 7.5 10*3/uL (ref 1.7–7.7)
Neutrophils Relative %: 60 %
Platelet Count: 384 10*3/uL (ref 150–400)
RBC: 4.34 MIL/uL (ref 3.87–5.11)
RDW: 16.7 % — ABNORMAL HIGH (ref 11.5–15.5)
Smear Review: NORMAL
WBC Count: 12.6 10*3/uL — ABNORMAL HIGH (ref 4.0–10.5)
nRBC: 0 % (ref 0.0–0.2)

## 2021-10-24 LAB — CMP (CANCER CENTER ONLY)
ALT: 23 U/L (ref 0–44)
AST: 20 U/L (ref 15–41)
Albumin: 4.5 g/dL (ref 3.5–5.0)
Alkaline Phosphatase: 129 U/L — ABNORMAL HIGH (ref 38–126)
Anion gap: 10 (ref 5–15)
BUN: 16 mg/dL (ref 6–20)
CO2: 24 mmol/L (ref 22–32)
Calcium: 9.7 mg/dL (ref 8.9–10.3)
Chloride: 104 mmol/L (ref 98–111)
Creatinine: 0.67 mg/dL (ref 0.44–1.00)
GFR, Estimated: >60 mL/min (ref >60)
Glucose, Bld: 113 mg/dL — ABNORMAL HIGH (ref 70–99)
Potassium: 3.5 mmol/L (ref 3.5–5.1)
Sodium: 138 mmol/L (ref 135–145)
Total Bilirubin: 0.7 mg/dL (ref 0.3–1.2)
Total Protein: 7.8 g/dL (ref 6.5–8.1)

## 2021-10-24 LAB — T4, FREE: Free T4: 0.87 ng/dL (ref 0.61–1.12)

## 2021-10-24 LAB — LACTATE DEHYDROGENASE: LDH: 208 U/L — ABNORMAL HIGH (ref 98–192)

## 2021-10-24 LAB — TSH: TSH: 2.043 u[IU]/mL (ref 0.350–4.500)

## 2021-10-24 LAB — MAGNESIUM: Magnesium: 2 mg/dL (ref 1.7–2.4)

## 2021-10-24 MED ORDER — MUPIROCIN 2 % EX OINT: 1.0000 | TOPICAL_OINTMENT | Freq: Three times a day (TID) | CUTANEOUS | 0 refills | Status: DC

## 2021-10-24 MED ORDER — TEMAZEPAM 15 MG PO CAPS: 15.0000 mg | ORAL_CAPSULE | Freq: Every evening | ORAL | 0 refills | Status: DC | PRN

## 2021-10-24 NOTE — Telephone Encounter (Signed)
I tried to call the patient but her mail box is full.  I sent a message to remind her to return stool test to look for Cdiff.  Can treat for SIBO with xifaxin if negative.  Should try the colestipol 2 grams twice daily, can do lomitil with it.  ER precautions sent to the patient.

## 2021-10-24 NOTE — Addendum Note (Signed)
Addended by: Fabio Neighbors A on: 10/24/2021 04:16 PM   Modules accepted: Orders

## 2021-10-24 NOTE — Progress Notes (Signed)
Hematology and Oncology Follow Up Visit  Cassidy Tran [id number] 05-12-1975 46 y.o. 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  Her problem continues to be the diarrhea.  The diarrhea just does not respond to anything that she is taking.  She went to Gastroenterology.  Hopefully, they will be able to help her out.  They did take a stool sample.  She has not heard back from them yet.  Maybe, she still does have C. difficile.  Again, know she has been on multiple different agents to try to help slow up the diarrhea.  I just do not think there is anything related to neuroendocrine carcinoma.  Her last Chromogranin A level was less than 14.  She has had no nausea or vomiting.  She has had no urinary issues.  She is eating okay.  She has not lost weight.  There is no rashes.  She has had no leg swelling.  She is not sleeping.  No matter what we try on her, this did not work.  Unfortunately, her insurance has been very difficult and improving certain sleep aids.  I will now try her on Restoril (15 mg p.o. nightly as needed) to try to help.  She does have a wound on the anterior abdominal wall.  This where she had a PEG tube put.  There is a little bit of discharge.  We did send a culture off.  I did call in some Bactroban ointment for her.  She has had no bleeding.  Overall, I would say performance status is probably ECOG 1.   Medications:  Current Outpatient Medications:    acetaminophen (TYLENOL) 325 MG tablet, Take 1-2 tablets (325-650 mg total) by mouth every 4 (four) hours as needed for mild pain., Disp: , Rfl:    albuterol (VENTOLIN HFA) 108 (90 Base) MCG/ACT inhaler, Inhale 2 puffs into the  lungs every 6 (six) hours as needed., Disp: , Rfl:    budesonide (PULMICORT) 0.5 MG/2ML nebulizer solution, Take 1 mL (0.25 mg total) by nebulization 2 (two) times daily., Disp: 60 mL, Rfl: 0   carvedilol (COREG) 3.125 MG tablet, Take 1 tablet (3.125 mg total) by mouth 2 (two) times daily with a meal., Disp: 60 tablet, Rfl: 0   cholestyramine (QUESTRAN) 4 g packet, Take 0.5 packets (2 g total) by mouth daily., Disp: 30 each, Rfl: 0   colestipol (COLESTID) 1 g tablet, Can take 1-2 pills twice a day for diarrhea, Disp: 60 tablet, Rfl: 2   diphenoxylate-atropine (LOMOTIL) 2.5-0.025 MG tablet, Take 2 tablets by mouth 4 (four) times daily. (Patient taking differently: Take 2 tablets by mouth 4 (four) times daily as needed.), Disp: 30 tablet, Rfl: 0   gabapentin (NEURONTIN) 100 MG capsule, Take 1 capsule (100 mg total) by mouth 3 (three) times daily., Disp: 270 capsule, Rfl: 3   levETIRAcetam (KEPPRA) 750 MG tablet, Take 1 tablet (750 mg total) by mouth 2 (two) times daily., Disp: 180 tablet, Rfl: 3   lipase/protease/amylase (CREON) 36000 UNITS CPEP capsule, Take 2 capsules (72,000 Units total) by mouth 3 (three) times daily with meals. May also take 1 capsule (36,000 Units total) as needed (with snacks)., Disp: 240 capsule, Rfl: 11  loperamide (IMODIUM A-D) 2 MG tablet, Take 2 mg by mouth 4 (four) times daily as needed for diarrhea or loose stools., Disp: , Rfl:    MAGNESIUM PO, Take 70 mg by mouth 2 (two) times daily. GUMMIES, Disp: , Rfl:    methocarbamol (ROBAXIN) 750 MG tablet, Take 750 mg by mouth every 6 (six) hours as needed., Disp: , Rfl:    mometasone (NASONEX) 50 MCG/ACT nasal spray, Use one to two sprays in each nostril once daily, Disp: 17 g, Rfl: 5   montelukast (SINGULAIR) 10 MG tablet, TAKE 1 TABLET BY MOUTH AT BEDTIME, Disp: 30 tablet, Rfl: 0   Multiple Vitamin (MULTIVITAMIN WITH MINERALS) TABS tablet, Take 1 tablet by mouth daily., Disp: , Rfl:    mupirocin ointment (BACTROBAN) 2 %, Apply  1 Application topically 3 (three) times daily., Disp: 22 g, Rfl: 0   pantoprazole (PROTONIX) 40 MG tablet, Take 1 tablet (40 mg total) by mouth daily., Disp: 30 tablet, Rfl: 1   potassium chloride SA (KLOR-CON M) 20 MEQ tablet, Take 2 tablets (40 mEq total) by mouth 2 (two) times daily. Take 2 tablets twice a day for 7 days and then take 1 tablet twice a day, Disp: 120 tablet, Rfl: 2   temazepam (RESTORIL) 15 MG capsule, Take 1 capsule (15 mg total) by mouth at bedtime as needed for sleep., Disp: 30 capsule, Rfl: 0   Tiotropium Bromide-Olodaterol (STIOLTO RESPIMAT) 2.5-2.5 MCG/ACT AERS, Inhale 2 puffs into the lungs daily., Disp: 4 g, Rfl: 0   tretinoin (RETIN-A) 0.05 % cream, Apply 1 application  topically at bedtime as needed (acne)., Disp: , Rfl:    zonisamide (ZONEGRAN) 100 MG capsule, Take 1 capsule (100 mg total) by mouth 2 (two) times daily., Disp: 180 capsule, Rfl: 3   belladonna-PHENObarbital (DONNATAL) 16.2 MG/5ML ELIX, Take 5 mLs (16.2 mg total) by mouth 4 (four) times daily. (Patient not taking: Reported on 10/10/2021), Disp: 600 mL, Rfl: 2 No current facility-administered medications for this visit.  Facility-Administered Medications Ordered in Other Visits:    lanreotide acetate (SOMATULINE DEPOT) injection 120 mg, 120 mg, Subcutaneous, Once, Cassidy Tran, Cassidy Cobb, MD  Allergies:  Allergies  Allergen Reactions   Aspirin Other (See Comments)    Childhood reaction   Codeine Nausea And Vomiting    Tolerates Hydrocodone   Erythromycin Other (See Comments)    Childhood reaction, tolerate Zpak   Lactose Intolerance (Gi) Diarrhea   Nsaids Nausea And Vomiting   Penicillins Other (See Comments)    Has patient had a PCN reaction causing immediate rash, facial/tongue/throat swelling, SOB or lightheadedness with hypotension: doesn't remember childhood Reaction  Has patient had a PCN reaction causing severe rash involving mucus membranes or skin necrosis: NO Has patient had a PCN reaction that  required hospitalization NO Has patient had a PCN reaction occurring within the last 10 years: NO If all of the above answers are "NO", then may proceed with Cephalosporin use.     Past Medical History, Surgical history, Social history, and Family History were reviewed and updated.  Review of Systems: Review of Systems  Constitutional:  Positive for fatigue and unexpected weight change.  HENT:  Negative.    Eyes: Negative.   Respiratory: Negative.    Cardiovascular: Negative.   Gastrointestinal:  Positive for diarrhea.  Endocrine: Negative.   Genitourinary: Negative.    Musculoskeletal: Negative.   Skin: Negative.   Neurological: Negative.   Hematological: Negative.   Psychiatric/Behavioral: Negative.      Physical Exam:  height is '5\' 3"'$  (1.6 m) and weight is 175 lb 12.8 oz (79.7 kg). Her oral temperature is 98 F (36.7 C). Her blood pressure is 141/75 (abnormal) and her pulse is 85. Her respiration is 18 and oxygen saturation is 99%.   Wt Readings from Last 3 Encounters:  10/24/21 175 lb 12.8 oz (79.7 kg)  10/10/21 176 lb 6 oz (80 kg)  09/22/21 170 lb 12.8 oz (77.5 kg)    Physical Exam Vitals reviewed.  HENT:     Head: Normocephalic and atraumatic.  Eyes:     Pupils: Pupils are equal, round, and reactive to light.  Cardiovascular:     Rate and Rhythm: Normal rate and regular rhythm.     Heart sounds: Normal heart sounds.     Comments: Cardiac exam shows a regular rate and rhythm with no murmurs, rubs or bruits. Pulmonary:     Effort: Pulmonary effort is normal.     Breath sounds: Normal breath sounds.     Comments: Her lungs sound clear bilaterally.  I hear no wheezing. Abdominal:     General: Bowel sounds are normal.     Palpations: Abdomen is soft.     Comments: Abdominal exam shows soft abdomen.  She has a laparotomy scar that is healed.  This is in the midline.  She has no fluid wave.  Bowel sounds are quite low.  There is no guarding or rebound tenderness.   There is no palpable liver or spleen tip.  Musculoskeletal:        General: No tenderness or deformity. Normal range of motion.     Cervical back: Normal range of motion.  Lymphadenopathy:     Cervical: No cervical adenopathy.  Skin:    General: Skin is warm and dry.     Findings: No erythema or rash.  Neurological:     Mental Status: She is alert and oriented to person, place, and time.  Psychiatric:        Behavior: Behavior normal.        Thought Content: Thought content normal.        Judgment: Judgment normal.      Lab Results  Component Value Date   WBC 12.6 (H) 10/24/2021   HGB 12.0 10/24/2021   HCT 37.3 10/24/2021   MCV 85.9 10/24/2021   PLT 384 10/24/2021     Chemistry      Component Value Date/Time   NA 138 10/24/2021 1158   K 3.5 10/24/2021 1158   CL 104 10/24/2021 1158   CO2 24 10/24/2021 1158   BUN 16 10/24/2021 1158   CREATININE 0.67 10/24/2021 1158   CREATININE 0.73 01/26/2020 1006      Component Value Date/Time   CALCIUM 9.7 10/24/2021 1158   ALKPHOS 129 (H) 10/24/2021 1158   AST 20 10/24/2021 1158   ALT 23 10/24/2021 1158   BILITOT 0.7 10/24/2021 1158      Impression and Plan: Ms. Bulman is a very nice 46 year old white female.  She has von Willebrand disease.  Her problem now that she has carcinoid.  She apparently had this resected.  Initial chromogranin A level was quite high at 463.  We gave her a dose of Somatuline.  The follow-up Chromogranin A level was normal at 28.  Now, the last chromogranin A level that was sent off was less than 14.  Again, her big problem is this diarrhea.  When not sure how much else we can do to help this.  She  has not yet been able to get the Donnatal.  I thought this may help her.  Of course, her insurance is still debating whether or not they will give this to her.  I will have to send A gastroenterology message to see if they can provide any further help.  I do not see that we have to do any scans on her.  We  will see what the culture shows for this PEG exit wound.  I would like to get her back to see me in another month.  Another option that we could try on her would be a tincture of opium.  I think this is still being made.  I told her that if this was C. difficile, that there have been studies have utilized fecal transplants which have helped.  I am unsure if this went over all that well with her.      Volanda Napoleon, MD 8/28/20231:29 PM

## 2021-10-24 NOTE — Telephone Encounter (Signed)
-----   Message from Volanda Napoleon, MD sent at 10/24/2021  1:33 PM EDT ----- Estill Bamberg: I saw Ms. Geisler today.  She still having a lot of problems with diarrhea.  I do not know what else I can do to help her out.  I do not think this is anything related to her having the carcinoid as her last Chromogranin A level was less than 14.  If there is anything you can do to help her out that would be great.  I know that you sent off a stool specimen.  I suppose this was for C. difficile.  She has not heard anything back from you guys.  Thank you so much for trying to help her.  She has absolutely no quality of life because she really cannot go anywhere because of the diarrhea.  I will keep praying about this.  Hope that you have a very blessed week.  [name]

## 2021-10-25 ENCOUNTER — Encounter: Payer: Self-pay | Admitting: *Deleted

## 2021-10-25 ENCOUNTER — Telehealth: Payer: Self-pay | Admitting: *Deleted

## 2021-10-25 LAB — CHROMOGRANIN A: Chromogranin A (ng/mL): 22.5 ng/mL (ref 0.0–101.8)

## 2021-10-25 NOTE — Telephone Encounter (Signed)
Unable to reach patient, vm box full. 

## 2021-10-25 NOTE — Telephone Encounter (Signed)
As noted below by Dr. Marin Olp, I tried calling the patient to inform her that the wound culture is negative so far. However, the phone voice mail box is full and unable to take messages.

## 2021-10-25 NOTE — Telephone Encounter (Signed)
-----   Message from Cassidy Napoleon, MD sent at 10/25/2021  9:09 AM EDT ----- Call - the wound culture is negative so far.  [name]

## 2021-10-26 ENCOUNTER — Other Ambulatory Visit: Payer: No Typology Code available for payment source

## 2021-10-26 ENCOUNTER — Encounter: Payer: Self-pay | Admitting: *Deleted

## 2021-10-26 DIAGNOSIS — R197 Diarrhea, unspecified: Secondary | ICD-10-CM

## 2021-10-26 NOTE — Telephone Encounter (Signed)
Spoke with patient regarding PA recommendations. She submitted stool test this morning & in the meantime she is going to try and increase colestipol to 2 grams BID. Pt advised to call back with any further questions/concerns.

## 2021-10-27 LAB — AEROBIC CULTURE W GRAM STAIN (SUPERFICIAL SPECIMEN)
Culture: NO GROWTH
Gram Stain: NONE SEEN

## 2021-10-27 LAB — CLOSTRIDIUM DIFFICILE TOXIN B, QUALITATIVE, REAL-TIME PCR: Toxigenic C. Difficile by PCR: NOT DETECTED

## 2021-10-28 LAB — GI PROFILE, STOOL, PCR
Adenovirus F 40/41: NOT DETECTED
Astrovirus: NOT DETECTED
C difficile toxin A/B: NOT DETECTED
Campylobacter: NOT DETECTED
Cryptosporidium: NOT DETECTED
Cyclospora cayetanensis: NOT DETECTED
Entamoeba histolytica: NOT DETECTED
Enteroaggregative E coli: NOT DETECTED
Enteropathogenic E coli: NOT DETECTED
Enterotoxigenic E coli: NOT DETECTED
Giardia lamblia: NOT DETECTED
Norovirus GI/GII: NOT DETECTED
Plesiomonas shigelloides: NOT DETECTED
Rotavirus A: NOT DETECTED
Salmonella: NOT DETECTED
Sapovirus: DETECTED — AB
Shiga-toxin-producing E coli: NOT DETECTED
Shigella/Enteroinvasive E coli: NOT DETECTED
Vibrio cholerae: NOT DETECTED
Vibrio: NOT DETECTED
Yersinia enterocolitica: NOT DETECTED

## 2021-11-01 ENCOUNTER — Other Ambulatory Visit: Payer: Self-pay

## 2021-11-01 MED ORDER — RIFAXIMIN 550 MG PO TABS: 550.0000 mg | ORAL_TABLET | Freq: Three times a day (TID) | ORAL | 0 refills | Status: AC

## 2021-11-04 LAB — FECAL FAT, QUALITATIVE
Fat Qual Neutral, Stl: NORMAL
Fat Qual Total, Stl: NORMAL

## 2021-11-08 ENCOUNTER — Other Ambulatory Visit (HOSPITAL_COMMUNITY): Payer: Self-pay

## 2021-11-14 ENCOUNTER — Telehealth (HOSPITAL_COMMUNITY): Payer: Self-pay

## 2021-11-14 ENCOUNTER — Ambulatory Visit: Payer: No Typology Code available for payment source | Admitting: Neurology

## 2021-11-14 DIAGNOSIS — M21372 Foot drop, left foot: Secondary | ICD-10-CM | POA: Diagnosis not present

## 2021-11-14 DIAGNOSIS — M21371 Foot drop, right foot: Secondary | ICD-10-CM | POA: Diagnosis not present

## 2021-11-14 NOTE — Procedures (Signed)
[facility]  [address]  [place], [place] [address] Tel: [phone] Fax: [phone] Test Date:  11/14/2021  Patient: Cassidy Tran DOB: [birth year] Physician: Kai Levins, MD  Sex: Female Height: '5\' 3"'$  Ref Phys: Ellouise Newer, MD  ID#: [id number]   Technician:    History: This is a 46 year old female and weakness, numbness, and tingling in bilateral lower extremities after prolonged hospitalization.  NCV & EMG Findings: Nerve conduction studies of right and left lower extremities with limited needle examination of the right lower extremity shows: Absent superficial peroneal sensory responses bilaterally. Reduced amplitude sural sensory responses bilaterally (L3, R4 V). Bilateral peroneal (EDB) motor responses are absent. The right peroneal (TA) motor response has reduced amplitude (1.45 mV), and the left peroneal (TA) motor response is borderline low amplitude. Bilateral tibial (AH) motor responses are within normal limits. Patient only allowed 3 muscles to be evaluated on needle examination. The right tibialis anterior muscle shows chronic motor loss changes without accompanying active denervation changes.  Impression: This is an abnormal electrodiagnostic evaluation of bilateral lower extremities. Patient was unable to tolerate needle examination, and the study was ended early at the patient's request. Interpretation is limited as a result. With this in mind, the study could be consistent with a distal symmetric polyneuropathy, at least moderate in degree electrically. Another possibility is bilateral peroneal mononeuropathies, axon loss in type, severe in degree in the right and moderate to severe in degree on the left. If a peroneal mononeuropathy exists, it cannot be localized due to the limited needle examination. If peroneal mononeuropathy is suspected, a neuromuscular ultrasound of bilateral lower extremities may be helpful and likely to be better  tolerated by the patient.    ___________________________ Kai Levins, MD    Nerve Conduction Studies Motor Nerve Results    Latency Amplitude F-Lat Segment Distance CV Comment  Site (ms) Norm (mV) Norm (ms)  (cm) (m/s) Norm   Left Fibular (EDB) Motor  Ankle *NR  < 6.5 *NR  > 2.0        Bel fib head *NR - *NR -  Bel fib head-Ankle - *NR  > 44   Pop fossa *NR - *NR -  Pop fossa-Bel fib head - *NR  > 44   Right Fibular (EDB) Motor  Ankle *NR  < 6.5 *NR  > 2.0        Bel fib head *NR - *NR -  Bel fib head-Ankle - *NR  > 44   Pop fossa *NR - *NR -  Pop fossa-Bel fib head - *NR  > 44   Left Fibular (TA) Motor  Fib head 2.2  < 6.7 3.4  > 3.0        Pop fossa 3.7  < 6.7 3.3  > 3.0  Pop fossa-Fib head 8 53  > 44   Right Fibular (TA) Motor  Fib head 2.0  < 6.7 *1.45  > 3.0        Pop fossa 3.9  < 6.7 *1.82  > 3.0  Pop fossa-Fib head 9 47  > 44   Left Tibial (AH) Motor  Ankle 2.9  < 5.8 15.7  > 4.0        Knee 10.8 - 10.3 -  Knee-Ankle 34 43  > 41   Right Tibial (AH) Motor  Ankle 3.1  < 5.8 17.3  > 4.0        Knee 10.6 - 12.5 -  Knee-Ankle 39.5 53  >  41    Sensory Sites    Neg Peak Lat Amplitude (O-P) Segment Distance Velocity Comment  Site (ms) Norm (V) Norm  (cm) (ms)   Left Superficial Fibular Sensory  14 cm-Ankle *NR  < 4.4 *NR  > 5 14 cm-Ankle 14    Right Superficial Fibular Sensory  14 cm-Ankle *NR  < 4.4 *NR  > 5 14 cm-Ankle 14    Left Sural Sensory  Calf-Lat mall 2.7  < 4.4 *3  > 5 Calf-Lat mall 14    Right Sural Sensory  Calf-Lat mall 2.6  < 4.4 *4  > 5 Calf-Lat mall 14     H-Reflex Results    M-Lat H Lat H Neg Amp H-M Lat  Site (ms) (ms) Norm (mV) (ms)  Left Tibial H-Reflex  Pop fossa 5.5 29.4 23.3.Marland KitchenMarland Kitchen29.5 0.47 23.9  Right Tibial H-Reflex  Pop fossa 6.0 29.5 23.3.Marland KitchenMarland Kitchen29.5 0.37 23.5   Electromyography   Side Muscle Ins.Act Fibs Fasc Recrt Amp Dur Poly Activation Comment  Right Biceps fem SH Nml Nml Nml Nml Nml Nml Nml Nml N/A  Right Gluteus med Nml Nml Nml Nml  Nml Nml Nml Nml N/A  Right Tib ant Nml Nml Nml *2- Nml *1+ *1+ *1- N/A      Waveforms:  Motor               Sensory           H-Reflex

## 2021-11-14 NOTE — Telephone Encounter (Signed)
No response from pt regarding PR.   Closed referral. 

## 2021-11-15 ENCOUNTER — Telehealth: Payer: Self-pay

## 2021-11-15 ENCOUNTER — Other Ambulatory Visit: Payer: Self-pay

## 2021-11-15 ENCOUNTER — Encounter: Payer: Self-pay | Admitting: Student

## 2021-11-15 DIAGNOSIS — J849 Interstitial pulmonary disease, unspecified: Secondary | ICD-10-CM

## 2021-11-15 DIAGNOSIS — G5733 Lesion of lateral popliteal nerve, bilateral lower limbs: Secondary | ICD-10-CM

## 2021-11-15 NOTE — Telephone Encounter (Signed)
-----   Message from Alda Berthold, DO sent at 11/15/2021  8:58 AM EDT ----- Please let pt know that nerve testing did show nerve injury, but because it was incomplete, it's difficult to tell whether this is neuropathy or nerve getting pinched around the knee.  To determine this, we can order nerve ultrasound with Dr. Berdine Addison which is not invasive or painful. If she agrees, please ask Hinton Dyer to schedule nerve ultrasound bilateral legs - eval peroneal nerves.  Thanks.

## 2021-11-15 NOTE — Telephone Encounter (Signed)
Pt called an informed that nerve testing did show nerve injury, but because it was incomplete, it's difficult to tell whether this is neuropathy or nerve getting pinched around the knee.  To determine this, we can order nerve ultrasound with Dr. Berdine Addison which is not invasive or painful.

## 2021-11-16 NOTE — Telephone Encounter (Signed)
Called to do PA [employer] system is down

## 2021-11-16 NOTE — Telephone Encounter (Signed)
Patient Is sch for the nerve ultrasound bilateral legs - eval peroneal nerves.  On 11-14*-23

## 2021-11-17 ENCOUNTER — Telehealth: Payer: Self-pay

## 2021-11-17 NOTE — Telephone Encounter (Signed)
[employer] called no PA was needed reference number [id number]

## 2021-11-17 NOTE — Telephone Encounter (Signed)
No PA needed reference number [id number]

## 2021-11-24 ENCOUNTER — Inpatient Hospital Stay: Payer: No Typology Code available for payment source | Admitting: Hematology & Oncology

## 2021-11-24 ENCOUNTER — Encounter: Payer: Self-pay | Admitting: Hematology & Oncology

## 2021-11-24 ENCOUNTER — Other Ambulatory Visit: Payer: Self-pay

## 2021-11-24 ENCOUNTER — Inpatient Hospital Stay: Payer: No Typology Code available for payment source | Attending: Hematology & Oncology

## 2021-11-24 VITALS — BP 151/75 | HR 88 | Temp 98.1°F | Resp 18 | Ht 63.0 in | Wt 182.1 lb

## 2021-11-24 DIAGNOSIS — D6800 Von Willebrand disease, unspecified: Secondary | ICD-10-CM | POA: Diagnosis not present

## 2021-11-24 DIAGNOSIS — C7A8 Other malignant neuroendocrine tumors: Secondary | ICD-10-CM | POA: Diagnosis not present

## 2021-11-24 DIAGNOSIS — Z79899 Other long term (current) drug therapy: Secondary | ICD-10-CM | POA: Insufficient documentation

## 2021-11-24 DIAGNOSIS — C7A012 Malignant carcinoid tumor of the ileum: Secondary | ICD-10-CM | POA: Insufficient documentation

## 2021-11-24 DIAGNOSIS — S31109A Unspecified open wound of abdominal wall, unspecified quadrant without penetration into peritoneal cavity, initial encounter: Secondary | ICD-10-CM

## 2021-11-24 DIAGNOSIS — R197 Diarrhea, unspecified: Secondary | ICD-10-CM | POA: Diagnosis not present

## 2021-11-24 LAB — CBC WITH DIFFERENTIAL (CANCER CENTER ONLY)
Abs Immature Granulocytes: 0.05 10*3/uL (ref 0.00–0.07)
Basophils Absolute: 0.1 10*3/uL (ref 0.0–0.1)
Basophils Relative: 0 %
Eosinophils Absolute: 0.2 10*3/uL (ref 0.0–0.5)
Eosinophils Relative: 1 %
HCT: 38.1 % (ref 36.0–46.0)
Hemoglobin: 12.3 g/dL (ref 12.0–15.0)
Immature Granulocytes: 0 %
Lymphocytes Relative: 38 %
Lymphs Abs: 4.2 10*3/uL — ABNORMAL HIGH (ref 0.7–4.0)
MCH: 28.9 pg (ref 26.0–34.0)
MCHC: 32.3 g/dL (ref 30.0–36.0)
MCV: 89.6 fL (ref 80.0–100.0)
Monocytes Absolute: 0.6 10*3/uL (ref 0.1–1.0)
Monocytes Relative: 5 %
Neutro Abs: 6.2 10*3/uL (ref 1.7–7.7)
Neutrophils Relative %: 56 %
Platelet Count: 380 10*3/uL (ref 150–400)
RBC: 4.25 MIL/uL (ref 3.87–5.11)
RDW: 15.3 % (ref 11.5–15.5)
WBC Count: 11.3 10*3/uL — ABNORMAL HIGH (ref 4.0–10.5)
nRBC: 0 % (ref 0.0–0.2)

## 2021-11-24 LAB — CMP (CANCER CENTER ONLY)
ALT: 16 U/L (ref 0–44)
AST: 14 U/L — ABNORMAL LOW (ref 15–41)
Albumin: 4.4 g/dL (ref 3.5–5.0)
Alkaline Phosphatase: 132 U/L — ABNORMAL HIGH (ref 38–126)
Anion gap: 7 (ref 5–15)
BUN: 14 mg/dL (ref 6–20)
CO2: 30 mmol/L (ref 22–32)
Calcium: 9.8 mg/dL (ref 8.9–10.3)
Chloride: 106 mmol/L (ref 98–111)
Creatinine: 0.79 mg/dL (ref 0.44–1.00)
GFR, Estimated: >60 mL/min (ref >60)
Glucose, Bld: 103 mg/dL — ABNORMAL HIGH (ref 70–99)
Potassium: 3.9 mmol/L (ref 3.5–5.1)
Sodium: 143 mmol/L (ref 135–145)
Total Bilirubin: 0.7 mg/dL (ref 0.3–1.2)
Total Protein: 7.4 g/dL (ref 6.5–8.1)

## 2021-11-24 LAB — MAGNESIUM: Magnesium: 1.9 mg/dL (ref 1.7–2.4)

## 2021-11-24 NOTE — Progress Notes (Signed)
Hematology and Oncology Follow Up Visit  Cassidy Tran [id number] 1975-11-05 46 y.o. 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  The diarrhea does seem to be a little bit better.  She did have a full GI work-up again.  Her stool was all negative for C. difficile.  There was positivity for Sapovirus.  I am unsure exactly if this is clinically significant.  I know it is part of the norovirus family.  From what the gastroenterologist told Cassidy Tran, that there is nothing to do for this.   The diarrhea is not as severe.  She has some days where she has very little diarrhea.  Again there is no evidence of carcinoid.  Her last Chromogranin A level was 22.  She might be sleeping a little bit better.  We did call in some Restoril for her.  She has had no cough.  There is no fever.  She has had no bleeding.  We did culture of the wound where she had her G-tube.  This is all negative for any bacteria.  Overall, her performance status is ECOG 1.    Medications:  Current Outpatient Medications:    acetaminophen (TYLENOL) 325 MG tablet, Take 1-2 tablets (325-650 mg total) by mouth every 4 (four) hours as needed for mild pain., Disp: , Rfl:    albuterol (VENTOLIN HFA) 108 (90 Base) MCG/ACT inhaler, Inhale 2 puffs into the lungs every 6 (six) hours as needed., Disp: , Rfl:    budesonide (PULMICORT) 0.5 MG/2ML nebulizer solution, Take 1 mL (0.25 mg total) by nebulization 2 (two) times daily., Disp: 60 mL, Rfl: 0   carvedilol (COREG) 3.125 MG tablet, Take 1 tablet (3.125 mg total) by mouth 2 (two) times daily with a meal., Disp: 60 tablet, Rfl: 0   cholestyramine (QUESTRAN) 4 g packet, Take 0.5 packets (2 g  total) by mouth daily., Disp: 30 each, Rfl: 0   colestipol (COLESTID) 1 g tablet, Can take 1-2 pills twice a day for diarrhea, Disp: 60 tablet, Rfl: 2   diphenoxylate-atropine (LOMOTIL) 2.5-0.025 MG tablet, Take 2 tablets by mouth 4 (four) times daily. (Patient taking differently: Take 2 tablets by mouth 4 (four) times daily as needed.), Disp: 30 tablet, Rfl: 0   gabapentin (NEURONTIN) 100 MG capsule, Take 1 capsule (100 mg total) by mouth 3 (three) times daily., Disp: 270 capsule, Rfl: 3   levETIRAcetam (KEPPRA) 750 MG tablet, Take 1 tablet (750 mg total) by mouth 2 (two) times daily., Disp: 180 tablet, Rfl: 3   lipase/protease/amylase (CREON) 36000 UNITS CPEP capsule, Take 2 capsules (72,000 Units total) by mouth 3 (three) times daily with meals. May also take 1 capsule (36,000 Units total) as needed (with snacks)., Disp: 240 capsule, Rfl: 11   loperamide (IMODIUM A-D) 2 MG tablet, Take 2 mg by mouth 4 (four) times daily as needed for diarrhea or loose stools., Disp: , Rfl:    MAGNESIUM PO, Take 70 mg by mouth 2 (two) times daily. GUMMIES, Disp: , Rfl:    methocarbamol (ROBAXIN) 750 MG tablet, Take 750 mg by mouth every 6 (six) hours as needed., Disp: , Rfl:    mometasone (NASONEX) 50 MCG/ACT nasal spray, Use one to two  sprays in each nostril once daily, Disp: 17 g, Rfl: 5   montelukast (SINGULAIR) 10 MG tablet, TAKE 1 TABLET BY MOUTH AT BEDTIME, Disp: 30 tablet, Rfl: 0   Multiple Vitamin (MULTIVITAMIN WITH MINERALS) TABS tablet, Take 1 tablet by mouth daily., Disp: , Rfl:    mupirocin ointment (BACTROBAN) 2 %, Apply 1 Application topically 3 (three) times daily., Disp: 22 g, Rfl: 0   pantoprazole (PROTONIX) 40 MG tablet, Take 1 tablet (40 mg total) by mouth daily., Disp: 30 tablet, Rfl: 1   potassium chloride SA (KLOR-CON M) 20 MEQ tablet, Take 2 tablets (40 mEq total) by mouth 2 (two) times daily. Take 2 tablets twice a day for 7 days and then take 1 tablet twice a day, Disp: 120 tablet, Rfl: 2    temazepam (RESTORIL) 15 MG capsule, Take 1 capsule (15 mg total) by mouth at bedtime as needed for sleep., Disp: 30 capsule, Rfl: 0   Tiotropium Bromide-Olodaterol (STIOLTO RESPIMAT) 2.5-2.5 MCG/ACT AERS, Inhale 2 puffs into the lungs daily., Disp: 4 g, Rfl: 0   tretinoin (RETIN-A) 0.05 % cream, Apply 1 application  topically at bedtime as needed (acne)., Disp: , Rfl:    zonisamide (ZONEGRAN) 100 MG capsule, Take 1 capsule (100 mg total) by mouth 2 (two) times daily., Disp: 180 capsule, Rfl: 3   belladonna-PHENObarbital (DONNATAL) 16.2 MG/5ML ELIX, Take 5 mLs (16.2 mg total) by mouth 4 (four) times daily. (Patient not taking: Reported on 10/10/2021), Disp: 600 mL, Rfl: 2 No current facility-administered medications for this visit.  Facility-Administered Medications Ordered in Other Visits:    lanreotide acetate (SOMATULINE DEPOT) injection 120 mg, 120 mg, Subcutaneous, Once, Cassidy Tran, Rudell Cobb, MD  Allergies:  Allergies  Allergen Reactions   Aspirin Other (See Comments)    Childhood reaction   Codeine Nausea And Vomiting    Tolerates Hydrocodone   Erythromycin Other (See Comments)    Childhood reaction, tolerate Zpak   Lactose Intolerance (Gi) Diarrhea   Nsaids Nausea And Vomiting   Penicillins Other (See Comments)    Has patient had a PCN reaction causing immediate rash, facial/tongue/throat swelling, SOB or lightheadedness with hypotension: doesn't remember childhood Reaction  Has patient had a PCN reaction causing severe rash involving mucus membranes or skin necrosis: NO Has patient had a PCN reaction that required hospitalization NO Has patient had a PCN reaction occurring within the last 10 years: NO If all of the above answers are "NO", then may proceed with Cephalosporin use.     Past Medical History, Surgical history, Social history, and Family History were reviewed and updated.  Review of Systems: Review of Systems  Constitutional:  Positive for fatigue and unexpected weight  change.  HENT:  Negative.    Eyes: Negative.   Respiratory: Negative.    Cardiovascular: Negative.   Gastrointestinal:  Positive for diarrhea.  Endocrine: Negative.   Genitourinary: Negative.    Musculoskeletal: Negative.   Skin: Negative.   Neurological: Negative.   Hematological: Negative.   Psychiatric/Behavioral: Negative.      Physical Exam:  height is '5\' 3"'$  (1.6 m) and weight is 182 lb 1.3 oz (82.6 kg). Her oral temperature is 98.1 F (36.7 C). Her blood pressure is 151/75 (abnormal) and her pulse is 88. Her respiration is 18 and oxygen saturation is 98%.   Wt Readings from Last 3 Encounters:  11/24/21 182 lb 1.3 oz (82.6 kg)  10/24/21 175 lb 12.8 oz (79.7 kg)  10/10/21 176 lb 6 oz (80 kg)  Physical Exam Vitals reviewed.  HENT:     Head: Normocephalic and atraumatic.  Eyes:     Pupils: Pupils are equal, round, and reactive to light.  Cardiovascular:     Rate and Rhythm: Normal rate and regular rhythm.     Heart sounds: Normal heart sounds.     Comments: Cardiac exam shows a regular rate and rhythm with no murmurs, rubs or bruits. Pulmonary:     Effort: Pulmonary effort is normal.     Breath sounds: Normal breath sounds.     Comments: Her lungs sound clear bilaterally.  I hear no wheezing. Abdominal:     General: Bowel sounds are normal.     Palpations: Abdomen is soft.     Comments: Abdominal exam shows soft abdomen.  She has a laparotomy scar that is healed.  This is in the midline.  She has no fluid wave.  Bowel sounds are quite low.  There is no guarding or rebound tenderness.  There is no palpable liver or spleen tip.  Musculoskeletal:        General: No tenderness or deformity. Normal range of motion.     Cervical back: Normal range of motion.  Lymphadenopathy:     Cervical: No cervical adenopathy.  Skin:    General: Skin is warm and dry.     Findings: No erythema or rash.  Neurological:     Mental Status: She is alert and oriented to person, place,  and time.  Psychiatric:        Behavior: Behavior normal.        Thought Content: Thought content normal.        Judgment: Judgment normal.      Lab Results  Component Value Date   WBC 11.3 (H) 11/24/2021   HGB 12.3 11/24/2021   HCT 38.1 11/24/2021   MCV 89.6 11/24/2021   PLT 380 11/24/2021     Chemistry      Component Value Date/Time   NA 138 10/24/2021 1158   K 3.5 10/24/2021 1158   CL 104 10/24/2021 1158   CO2 24 10/24/2021 1158   BUN 16 10/24/2021 1158   CREATININE 0.67 10/24/2021 1158   CREATININE 0.73 01/26/2020 1006      Component Value Date/Time   CALCIUM 9.7 10/24/2021 1158   ALKPHOS 129 (H) 10/24/2021 1158   AST 20 10/24/2021 1158   ALT 23 10/24/2021 1158   BILITOT 0.7 10/24/2021 1158      Impression and Plan: Cassidy Tran is a very nice 46 year old white female.  She has von Willebrand disease.  Her problem now is the history of carcinoid.  She apparently had this resected.  Initial chromogranin A level was quite high at 463.  We gave her a dose of Somatuline.  The follow-up Chromogranin A level was normal at 28.  Now, the last chromogranin A level that was sent off was 22.  I am glad that the diarrhea seems to be doing a little bit better.  Maybe she did have this virus that just has to run its course.  I do not see that we have to do any scans on her.  I think as long as the Chromogranin A level is normal, we can just watch her.  I think that we can probably get her back now close to Thanksgiving.  Hopefully, things will continue to improve.        Volanda Napoleon, MD 9/28/20239:08 AM

## 2021-11-28 ENCOUNTER — Encounter: Payer: Self-pay | Admitting: Neurology

## 2021-11-29 LAB — CHROMOGRANIN A: Chromogranin A (ng/mL): 17.1 ng/mL (ref 0.0–101.8)

## 2021-12-02 ENCOUNTER — Encounter: Payer: Self-pay | Admitting: Neurology

## 2021-12-02 ENCOUNTER — Ambulatory Visit: Payer: No Typology Code available for payment source | Admitting: Neurology

## 2021-12-02 ENCOUNTER — Other Ambulatory Visit (INDEPENDENT_AMBULATORY_CARE_PROVIDER_SITE_OTHER): Payer: No Typology Code available for payment source

## 2021-12-02 VITALS — BP 160/86 | HR 98 | Ht 63.0 in | Wt 180.2 lb

## 2021-12-02 DIAGNOSIS — G629 Polyneuropathy, unspecified: Secondary | ICD-10-CM

## 2021-12-02 DIAGNOSIS — G40309 Generalized idiopathic epilepsy and epileptic syndromes, not intractable, without status epilepticus: Secondary | ICD-10-CM | POA: Diagnosis not present

## 2021-12-02 MED ORDER — LEVETIRACETAM 750 MG PO TABS: ORAL_TABLET | ORAL | 3 refills | Status: DC

## 2021-12-02 MED ORDER — ZONISAMIDE 100 MG PO CAPS: ORAL_CAPSULE | ORAL | 3 refills | Status: DC

## 2021-12-02 MED ORDER — GABAPENTIN 100 MG PO CAPS: 100.0000 mg | ORAL_CAPSULE | Freq: Three times a day (TID) | ORAL | 3 refills | Status: DC

## 2021-12-02 NOTE — Patient Instructions (Signed)
Good to see you doing better.  Let's do bloodwork for B12, B1, ESR, folate, ANA  2. Proceed with nerve ultrasound as scheduled  3. Continue Zonisamide 131m every night, Levetiracetam 7575mevery night, and Gabapentin as needed. You can increase night dose of gabapentin as tolerated to help with neuropathic pain  4. Try the lidocaine cream again as tolerated  5. Follow-up in 6 months, call for any changes   Seizure Precautions: 1. If medication has been prescribed for you to prevent seizures, take it exactly as directed.  Do not stop taking the medicine without talking to your doctor first, even if you have not had a seizure in a long time.   2. Avoid activities in which a seizure would cause danger to yourself or to others.  Don't operate dangerous machinery, swim alone, or climb in high or dangerous places, such as on ladders, roofs, or girders.  Do not drive unless your doctor says you may.  3. If you have any warning that you may have a seizure, lay down in a safe place where you can't hurt yourself.    4.  No driving for 6 months from last seizure, as [facility]  Please refer to the following link on the [facility] for more information: [email or web address]   5.  Maintain good sleep hygiene.  6.  Notify your neurology if you are planning pregnancy or if you become pregnant.  7.  Contact your doctor if you have any problems that may be related to the medicine you are taking.  8.  Call 911 and bring the patient back to the ED if:        A.  The seizure lasts longer than 5 minutes.       B.  The patient doesn't awaken shortly after the seizure  C.  The patient has new problems such as difficulty seeing, speaking or moving  D.  The patient was injured during the seizure  E.  The patient has a temperature over 102 F (39C)  F.  The patient vomited and now is having trouble  breathing

## 2021-12-02 NOTE — Progress Notes (Signed)
NEUROLOGY FOLLOW UP OFFICE NOTE  Cassidy Tran [id number] 01-24-1976  HISTORY OF PRESENT ILLNESS: I had the pleasure of seeing Cassidy Tran in follow-up in the neurology clinic on 12/02/2021.  The patient was last seen 4 months ago for JME and bilateral foot drop. She is alone in the office today. Records and images were personally reviewed where available.  On her last visit, Levetiracetam '750mg'$  BID and Zonisamide '100mg'$  BID were restarted as medications had been changed while she was admitted to the hospital in March 2023 for GI issues. She states that she went back to taking her medications as she was prior to hospitalization, taking Zonisamide '100mg'$  qhs and Levetiracetam '750mg'$  qhs with no seizrue recurrence. She denies any staring/unresponsive episodes, gaps in time, olfactory/gustatory hallucinations, myoclonic jerks. No convulsions since 2011. She had bilateral foot drop after her prolonged hospitalization in April 2023. EMG/NCV of both lower extremities in 10/2011 was abnormal, however limited as she was unable to tolerate the needle exam. The study could be consistent with a distal symmetric polyneuropathy, at least moderate in degree electrically. Another possibility is bilateral peroneal mononeuropathies, axon loss in type, severe in degree in the right and moderate to severe in degree on the left. Neuromuscular ultrasound may be helpful and likely better tolerated, she is scheduled for this next month.   She reports significant improvement in symptoms since her last visit. She is now driving, the foot drop has improved/resolved. She still has numbness/tingling/burning/stabbing pain in both feet that are constant and worse as the day progresses. Symptoms are worse if she does not wear compression socks. Putting a blanket on her feet would hurt. Symptoms affect both feet, R>L, from below the knees to her toes. She has gabapentin but only takes it as needed. She denies any neck or back pain.  She takes all her seizure medications only at bedtime, she missed her morning doses of all her medications one time and had so much of a better day. She is also having less diarrhea after increasing magnesium intake. She denies any alcohol intake.   Lab Results  Component Value Date   HGBA1C 6.1 (H) 05/15/2021   Lab Results  Component Value Date   TSH 2.043 10/24/2021    History on Initial Assessment 08/02/2021: This is a pleasant 46 year old right-handed woman with a history of von Willebrand disease, neuroendocrine tumor, Juvenile Myoclonic Epilepsy since age 17, presenting to establish care for seizures. Records were reviewed. Seizures are usually preceded by myoclonic jerks. She reports longest seizure-free interval was 25 years, then she had a GTC in 2009, 2010, and 2011. She and her husband deny any convulsions since 2011. She was being [facility] at [facility], last visit was in 2017. At that time she was on Zonisamide '100mg'$  BID and Depakote ER '2000mg'$  qhs. She has not seen a neurologist since then. She started having diarrhea in 08/2020 and was found to have biopsy-proven neuroendocrine tumor of the terminal ileum. She underwent GI surgery (exlap, R hemicolectomy, cholecystectomy) in 05/05/21, course was complicated by respiratory failure requiring intubation and trach. She had altered mental status with hyperammonemia (104) felt to be due to Depakote. She was seen by Neurology during her admission and Depakote was stopped. She was on IV Levetiracetam and had continuous EEG monitoring for 48 hours with diffuse background slowing, no epileptiform discharges or electrographic seizures. She was discharged on Levetiracetam '750mg'$  BID and Zonisamide '100mg'$  BID, however states that the LEV was stopped and that she  has only been taking Zonisamide '100mg'$  qhs. She and her husband deny any staring/unresponsive episodes, gaps in time, olfactory/gustatory hallucinations. She mostly has myoclonic jerks if she  forgets to take her medication or when sleep deprived. She could not remember how she was taking the Zonisamide prior to hospitalization.  She was in inpatient rehab after hospitalization from 4/9 to 06/25/21. She reports that she was dropped by a nurse tech and fell on her feet, and since then she has had bilateral foot drop R>L and constant burning/tingling on the dorsum of both feet and below her knees. She reported right foot pain and had an xray showing a subacute right 5th metatarsal fracture. She reports that Gabapentin '100mg'$  TID was started however she ran out of medication, it does help with the paresthesias. She ambulates with a walker. She denies any symptoms of neuropathy prior to her hospitalization. She has also been having numbness in the last 2 fingers on her right hand since hospitalization. She has tightness in her neck and back. She continues to deal with frequent diarrhea, no urinary issues. The diarrhea is causing a lot of anxiety, keeping her up at night due to frequent BMs. She has a history of VP shunt secondary to Chiari malformation, she was seen by Neurosurgery during her admission and had a lumbar puncture with normal CSF. She continues to follow-up with Neurosurgery for the shunt.   Epilepsy Risk Factors:  She had a normal birth and early development.  There is no history of febrile convulsions, CNS infections such as meningitis/encephalitis, significant traumatic brain injury, neurosurgical procedures, or family history of seizures.  Prior ASMs: Topiramate, Depakote (hyperammonemia) Prior headache preventative medications: amitriptyline, Topiramate  Diagnostic Data: EEGs: EEG in 2011 reported intermittent 4-5 Hz spike and polyspike and wave complexes MRI: MRI brain in 2015 no acute changes, hippocampi symmetric. At that time, note of cerebellar tonsillar ectopia measuring 3m.  Head CT without contrast 05/2021 VP shunt in the occipital region on the right terminates in the  frontal lobe on left, ventricles stable, no hydrocephalus. Prior Chiari decompression with stable appearance of foramen magnum   PAST MEDICAL HISTORY: Past Medical History:  Diagnosis Date   Acid reflux    Allergy    Anxiety    Arthritis    lower back   ASCUS (atypical squamous cells of undetermined significance) on Pap smear 03/2011   NEG HR HPV   Asthma    Cancer (HCC)    skin cancer- age 18ish   Cervical dysplasia, mild 08/2010   LGSIL colposcopy biopsy showing koilocytotic atypia   Clotting disorder ([place]    Depression    Endometriosis    Headache(784.0)    High risk HPV infection 12/2011   Pap normal   Hypertension    Pneumonia    2016ish   Seizures (HCC)    seizure disorder   Smoker    [place]brand disease [place]     MEDICATIONS: Current Outpatient Medications on File Prior to Visit  Medication Sig Dispense Refill   acetaminophen (TYLENOL) 325 MG tablet Take 1-2 tablets (325-650 mg total) by mouth every 4 (four) hours as needed for mild pain.     albuterol (VENTOLIN HFA) 108 (90 Base) MCG/ACT inhaler Inhale 2 puffs into the lungs every 6 (six) hours as needed.     belladonna-PHENObarbital (DONNATAL) 16.2 MG/5ML ELIX Take 5 mLs (16.2 mg total) by mouth 4 (four) times daily. (Patient not taking: Reported on 10/10/2021) 600 mL 2   budesonide (PULMICORT)  0.5 MG/2ML nebulizer solution Take 1 mL (0.25 mg total) by nebulization 2 (two) times daily. 60 mL 0   carvedilol (COREG) 3.125 MG tablet Take 1 tablet (3.125 mg total) by mouth 2 (two) times daily with a meal. 60 tablet 0   cholestyramine (QUESTRAN) 4 g packet Take 0.5 packets (2 g total) by mouth daily. 30 each 0   colestipol (COLESTID) 1 g tablet Can take 1-2 pills twice a day for diarrhea 60 tablet 2   diphenoxylate-atropine (LOMOTIL) 2.5-0.025 MG tablet Take 2 tablets by mouth 4 (four) times daily. (Patient taking differently: Take 2 tablets by mouth 4 (four) times daily as needed.) 30 tablet 0   gabapentin  (NEURONTIN) 100 MG capsule Take 1 capsule (100 mg total) by mouth 3 (three) times daily. 270 capsule 3   levETIRAcetam (KEPPRA) 750 MG tablet Take 1 tablet (750 mg total) by mouth 2 (two) times daily. 180 tablet 3   lipase/protease/amylase (CREON) 36000 UNITS CPEP capsule Take 2 capsules (72,000 Units total) by mouth 3 (three) times daily with meals. May also take 1 capsule (36,000 Units total) as needed (with snacks). 240 capsule 11   loperamide (IMODIUM A-D) 2 MG tablet Take 2 mg by mouth 4 (four) times daily as needed for diarrhea or loose stools.     MAGNESIUM PO Take 70 mg by mouth 2 (two) times daily. GUMMIES     methocarbamol (ROBAXIN) 750 MG tablet Take 750 mg by mouth every 6 (six) hours as needed.     mometasone (NASONEX) 50 MCG/ACT nasal spray Use one to two sprays in each nostril once daily 17 g 5   montelukast (SINGULAIR) 10 MG tablet TAKE 1 TABLET BY MOUTH AT BEDTIME 30 tablet 0   Multiple Vitamin (MULTIVITAMIN WITH MINERALS) TABS tablet Take 1 tablet by mouth daily.     mupirocin ointment (BACTROBAN) 2 % Apply 1 Application topically 3 (three) times daily. 22 g 0   pantoprazole (PROTONIX) 40 MG tablet Take 1 tablet (40 mg total) by mouth daily. 30 tablet 1   potassium chloride SA (KLOR-CON M) 20 MEQ tablet Take 2 tablets (40 mEq total) by mouth 2 (two) times daily. Take 2 tablets twice a day for 7 days and then take 1 tablet twice a day 120 tablet 2   temazepam (RESTORIL) 15 MG capsule Take 1 capsule (15 mg total) by mouth at bedtime as needed for sleep. 30 capsule 0   Tiotropium Bromide-Olodaterol (STIOLTO RESPIMAT) 2.5-2.5 MCG/ACT AERS Inhale 2 puffs into the lungs daily. 4 g 0   tretinoin (RETIN-A) 0.05 % cream Apply 1 application  topically at bedtime as needed (acne).     zonisamide (ZONEGRAN) 100 MG capsule Take 1 capsule (100 mg total) by mouth 2 (two) times daily. 180 capsule 3   Current Facility-Administered Medications on File Prior to Visit  Medication Dose Route Frequency  Provider Last Rate Last Admin   lanreotide acetate (SOMATULINE DEPOT) injection 120 mg  120 mg Subcutaneous Once Ennever, Rudell Cobb, MD        ALLERGIES: Allergies  Allergen Reactions   Aspirin Other (See Comments)    Childhood reaction   Codeine Nausea And Vomiting    Tolerates Hydrocodone   Erythromycin Other (See Comments)    Childhood reaction, tolerate Zpak   Lactose Intolerance (Gi) Diarrhea   Nsaids Nausea And Vomiting   Penicillins Other (See Comments)    Has patient had a PCN reaction causing immediate rash, facial/tongue/throat swelling, SOB or lightheadedness with hypotension:  doesn't remember childhood Reaction  Has patient had a PCN reaction causing severe rash involving mucus membranes or skin necrosis: NO Has patient had a PCN reaction that required hospitalization NO Has patient had a PCN reaction occurring within the last 10 years: NO If all of the above answers are "NO", then may proceed with Cephalosporin use.     FAMILY HISTORY: Family History  Problem Relation Age of Onset   Hypertension Mother    Heart disease Mother    Hyperlipidemia Mother    Diabetes Father    Hypertension Father    Heart disease Father    Hyperlipidemia Father    Stroke Father    Prostate cancer Father    Lymphoma Sister        NON-HODGKINS LYMPHOMA   Cancer Maternal Uncle        Lung and pancreatic cancer   Colon cancer Neg Hx    Rectal cancer Neg Hx    Esophageal cancer Neg Hx     SOCIAL HISTORY: Social History   Socioeconomic History   Marital status: Married    Spouse name: Not on file   Number of children: Not on file   Years of education: Not on file   Highest education level: Not on file  Occupational History   Occupation: Customer Service  Tobacco Use   Smoking status: Former    Packs/day: 0.50    Years: 20.00    Total pack years: 10.00    Types: Cigarettes    Quit date: 05/05/2021    Years since quitting: 0.5   Smokeless tobacco: Never  Vaping Use    Vaping Use: Never used  Substance and Sexual Activity   Alcohol use: Not Currently    Alcohol/week: 0.0 standard drinks of alcohol    Comment: Rare   Drug use: No   Sexual activity: Yes    Birth control/protection: Surgical    Comment: 1st intercourse 46 yo-More than 5 partners, MARRIED- 36 YRS   Other Topics Concern   Not on file  Social History Narrative   Right handed    Live with husband and dog in a one level home.    Caffeine rarely   Social Determinants of Radio broadcast assistant Strain: Not on file  Food Insecurity: Not on file  Transportation Needs: Not on file  Physical Activity: Not on file  Stress: Not on file  Social Connections: Not on file  Intimate Partner Violence: Not on file     PHYSICAL EXAM: Vitals:   12/02/21 1506  BP: (!) 160/86  Pulse: 98  SpO2: 98%   General: No acute distress Head:  Normocephalic/atraumatic Skin/Extremities: No rash, no edema Neurological Exam: alert and awake. No aphasia or dysarthria. Fund of knowledge is appropriate.  Attention and concentration are normal.   Cranial nerves: Pupils equal, round. Extraocular movements intact with no nystagmus. Visual fields full.  No facial asymmetry.  Motor: Bulk and tone normal, muscle strength 5/5 throughout, including 5/5 dorsiflexion/plantar flexion, eversion/inversion (much improved from last visit). Sensation intact to all modalities on both UE, decreased temperature to ankles bilaterally, hyperesthesia to pin up to mid-calves bilaterally. Intact vibration sense. Negative Romberg test. Reflexes +2 throughout except for +1 left ankle. Gait narrow-based and steady, able to tandem walk.    IMPRESSION: This is a pleasant 46 yo RH woman with a history of von Willebrand disease, neuroendocrine tumor, with Juvenile Myoclonic Epilepsy since age 32. No convulsions since 2011. She is taking Levetiracetam '750mg'$  qhs  and Zonisamide '100mg'$  qhs, morning dose would make her feel off. Since she is doing  well, we agreed to continue on this regimen. She had bilateral foot drop after her prolonged hospitalization in April 2023. EMG/NCV of both lower extremities in 10/2011 was abnormal, however limited as she was unable to tolerate the needle exam. The study could be consistent with a distal symmetric polyneuropathy, at least moderate in degree electrically. Another possibility is bilateral peroneal mononeuropathies, axon loss in type, severe in degree in the right and moderate to severe in degree on the left. Neuromuscular ultrasound has been scheduled for next month. The bilateral foot drop has significant improved, however she continues to report neuropathic pain. Neuropathy labs will be ordered. She has Gabapentin that she only takes as needed, she may increase evening dose as tolerated, monitoring for any increase in seizures as gabapentin can worsen seizures in patients with primary generalized epilepsy. She will try lidocaine cream. She is aware of [place] driving laws to stop driving after a seizure until 6 months seizure-free. Follow-up in 6 months, call for any changes.    Thank you for allowing me to participate in her care.  Please do not hesitate to call for any questions or concerns.    Ellouise Newer, M.D.   CC: Dr. Theda Sers

## 2021-12-06 LAB — FOLATE: Folate: 4.8 ng/mL — ABNORMAL LOW

## 2021-12-06 LAB — VITAMIN B12: Vitamin B-12: 182 pg/mL — ABNORMAL LOW (ref 200–1100)

## 2021-12-06 LAB — ANA: Anti Nuclear Antibody (ANA): NEGATIVE

## 2021-12-06 LAB — VITAMIN B1: Vitamin B1 (Thiamine): 8 nmol/L (ref 8–30)

## 2021-12-06 LAB — SEDIMENTATION RATE: Sed Rate: 36 mm/h — ABNORMAL HIGH (ref 0–20)

## 2021-12-08 ENCOUNTER — Telehealth (HOSPITAL_COMMUNITY): Payer: Self-pay

## 2021-12-08 NOTE — Telephone Encounter (Signed)
Called patient to see if she was interested in participating in the Pulmonary Rehab Program. Patient stated yes. Patient will come in for orientation on 12/16/21 @ 9AM and will attend the 1:15PM exercise class.   Tourist information centre manager.

## 2021-12-14 ENCOUNTER — Telehealth: Payer: Self-pay

## 2021-12-14 NOTE — Telephone Encounter (Signed)
Pt called in an she was informed that Q98 and folic acid levels were low. Since B12 level is <250, we recommend B12 injections. She will need 1000 mcg daily for 1 week, then weekly for 1 month, then monthly for a year. Would it be easier/closer for her to do this with his PCP or would they like [place] to do it? Other option is we teach someone in household to give injections and they do it for her. Pt stated that she wants it sent to her PCP office For the folic acid, recommend taking a daily folic acid '1mg'$  supplement

## 2021-12-15 ENCOUNTER — Telehealth (HOSPITAL_COMMUNITY): Payer: Self-pay

## 2021-12-15 ENCOUNTER — Encounter: Payer: Self-pay | Admitting: Gastroenterology

## 2021-12-15 ENCOUNTER — Ambulatory Visit (INDEPENDENT_AMBULATORY_CARE_PROVIDER_SITE_OTHER): Payer: No Typology Code available for payment source | Admitting: Gastroenterology

## 2021-12-15 VITALS — BP 140/80 | Ht 63.0 in | Wt 181.2 lb

## 2021-12-15 DIAGNOSIS — D3A8 Other benign neuroendocrine tumors: Secondary | ICD-10-CM

## 2021-12-15 DIAGNOSIS — R197 Diarrhea, unspecified: Secondary | ICD-10-CM

## 2021-12-15 DIAGNOSIS — Z86010 Personal history of colonic polyps: Secondary | ICD-10-CM

## 2021-12-15 DIAGNOSIS — R194 Change in bowel habit: Secondary | ICD-10-CM

## 2021-12-15 DIAGNOSIS — Z9049 Acquired absence of other specified parts of digestive tract: Secondary | ICD-10-CM

## 2021-12-15 NOTE — Progress Notes (Signed)
Chief Complaint:   Diarrhea, abdominal cramping  GI History: 46 y.o. female with a past medical history of anxiety, asthma, depression, endometriosis, seizure disorder, von Willebrand disease, tobacco use, hysterectomy, Chiari malformation s/p VP shunt 2018, neuroendocrine tumor, cholecystectomy.  - 12/16/2020: Initial GI appointment for multiple GI symptoms, including diarrhea x1 year. Had unremarkable CBC, CMET, thyroid, CRP, ANA, fecal calprotectin, pancreatic elastase.  C. Difficile+, treated with vancomycin. - 03/11/2021: Colonoscopy showed perianal skin tags, 15 mm polyp in the rectum (TVA), nonbleeding angiodysplastic lesion, internal hemorrhoid.  Polypoid lesion terminal ileum (PATH: well-differentiated neuroendocrine grade 1 tumor).  Random colon biopsies negative macroscopic colitis. - 03/11/2021: EGD: LA grade A reflux esophagitis, single bleeding angiectasia in stomach status post MR clip, hematin in the gastric fundus and body, mild nonulcer gastritis, normal duodenum. Negative celiac, negative H. pylori - 05/05/2021: Right hemicolectomy with ileocolonic anastomosis and resection of 50 cm of small bowel (path: Well differentiated neuroendocrine tumor).  Cholecystectomy.  Prolonged hospital course including trach/PEG MDCT and rehab facility.  Elevated chromogranin A at 463, treated with Somatuline and follows with Dr. Marin Olp - Started on Creon, cholestyramine (no change), Lomotil, Imodium - 08/17/2021: PET CT dotatate: No residual neuroendocrine tumor, no metastasis - 10/10/2021: GI follow-up appointment: Some improvement with Creon.  Started on colestipol pills, low-fat diet with frequent meals.  Consideration for SIBO testing - 11/16/2021: Chromogranin A 17.1  HPI:     Patient is a 46 y.o. female presenting to the Gastroenterology Clinic for follow-up.  Was last seen by Vicie Mutters on 10/10/2021.  Still has episodic diarrhea and abdominal cramping.  Has "good days and bad days". Now  having can have up to 12 BM/day (previously up to 30/day), and other days 0 BM. Currenlty taking colestipol, Creon, Lomotil (thinks she takes daily), and Imoddium prn  Tolerating PO intake w/o issue.    Review of systems:     No chest pain, no SOB, no fevers, no urinary sx   Past Medical History:  Diagnosis Date   Acid reflux    Allergy    Anxiety    Arthritis    lower back   ASCUS (atypical squamous cells of undetermined significance) on Pap smear 03/2011   NEG HR HPV   Asthma    Cancer (HCC)    skin cancer- age 18ish   Cervical dysplasia, mild 08/2010   LGSIL colposcopy biopsy showing koilocytotic atypia   Clotting disorder ([place])    Depression    Endometriosis    Headache(784.0)    High risk HPV infection 12/2011   Pap normal   Hypertension    Pneumonia    2016ish   Seizures (HCC)    seizure disorder   Smoker    Von Willebrand disease ([place])     Patient's surgical history, family medical history, social history, medications and allergies were all reviewed in Epic    Current Outpatient Medications  Medication Sig Dispense Refill   acetaminophen (TYLENOL) 325 MG tablet Take 1-2 tablets (325-650 mg total) by mouth every 4 (four) hours as needed for mild pain.     albuterol (VENTOLIN HFA) 108 (90 Base) MCG/ACT inhaler Inhale 2 puffs into the lungs every 6 (six) hours as needed.     budesonide (PULMICORT) 0.5 MG/2ML nebulizer solution Take 1 mL (0.25 mg total) by nebulization 2 (two) times daily. 60 mL 0   carvedilol (COREG) 3.125 MG tablet Take 1 tablet (3.125 mg total) by mouth 2 (two) times daily with a meal. (Patient taking  differently: Take 3.125 mg by mouth 2 (two) times daily with a meal. Taking 1 tablet at night) 60 tablet 0   colestipol (COLESTID) 1 g tablet Can take 1-2 pills twice a day for diarrhea 60 tablet 2   diphenoxylate-atropine (LOMOTIL) 2.5-0.025 MG tablet Take 2 tablets by mouth 4 (four) times daily. (Patient taking differently: Take 2 tablets by mouth 4  (four) times daily as needed.) 30 tablet 0   gabapentin (NEURONTIN) 100 MG capsule Take 1 capsule (100 mg total) by mouth 3 (three) times daily. 270 capsule 3   levETIRAcetam (KEPPRA) 750 MG tablet Take 1 tablet every night 90 tablet 3   lipase/protease/amylase (CREON) 36000 UNITS CPEP capsule Take 2 capsules (72,000 Units total) by mouth 3 (three) times daily with meals. May also take 1 capsule (36,000 Units total) as needed (with snacks). 240 capsule 11   loperamide (IMODIUM A-D) 2 MG tablet Take 2 mg by mouth 4 (four) times daily as needed for diarrhea or loose stools.     Magnesium 250 MG TABS Take 250 mg by mouth at bedtime.     mometasone (NASONEX) 50 MCG/ACT nasal spray Use one to two sprays in each nostril once daily 17 g 5   Multiple Vitamin (MULTIVITAMIN WITH MINERALS) TABS tablet Take 1 tablet by mouth daily.     mupirocin ointment (BACTROBAN) 2 % Apply 1 Application topically 3 (three) times daily. 22 g 0   potassium chloride SA (KLOR-CON M) 20 MEQ tablet Take 2 tablets (40 mEq total) by mouth 2 (two) times daily. Take 2 tablets twice a day for 7 days and then take 1 tablet twice a day (Patient taking differently: Take 40 mEq by mouth 2 (two) times daily. Take 2 tablets twice a day for 7 days and then take 1 tablet twice a day) 120 tablet 2   temazepam (RESTORIL) 15 MG capsule Take 1 capsule (15 mg total) by mouth at bedtime as needed for sleep. 30 capsule 0   Tiotropium Bromide-Olodaterol (STIOLTO RESPIMAT) 2.5-2.5 MCG/ACT AERS Inhale 2 puffs into the lungs daily. 4 g 0   tretinoin (RETIN-A) 0.05 % cream Apply 1 application  topically at bedtime as needed (acne).     zonisamide (ZONEGRAN) 100 MG capsule Take 1 capsule every night 90 capsule 3   No current facility-administered medications for this visit.   Facility-Administered Medications Ordered in Other Visits  Medication Dose Route Frequency Provider Last Rate Last Admin   lanreotide acetate (SOMATULINE DEPOT) injection 120 mg   120 mg Subcutaneous Once Volanda Napoleon, MD        Physical Exam:     BP (!) 140/80   Ht '5\' 3"'$  (1.6 m)   Wt 181 lb 3.2 oz (82.2 kg)   LMP 09/16/2012   BMI 32.10 kg/m   GENERAL:  Pleasant female in NAD PSYCH: : Cooperative, normal affect Musculoskeletal:  Normal muscle tone, normal strength NEURO: Alert and oriented x 3, no focal neurologic deficits   IMPRESSION and PLAN:    1) Change in bowel habits 2) Diarrhea 3) Abdominal cramping  Etiology for continued variable bowel habits not entirely clear.  While she is at elevated risk for bile salt diarrhea and postoperative diarrhea, have higher suspicion for IBS as symptoms existed well prior to colonoscopy and subsequent ileocecectomy and CCY.  Previous colonoscopy with random biopsies was negative for Microscopic Colitis.  Similarly, EGD negative for Celiac or malabsorptive disorder.  Otherwise imaging negative for recurrence/metastasis of NET.  Plan for  the following:  - Trial supplemental fiber.  She would like to try FiberCon tablets - Trial low FODMAP diet.  Provided with handout and detailed instruction today - Holding Creon as her pancreatic elastase was normal and she does not notice any benefit with this - Continue colestipol for the time being - Continue Lomotil along with Imodium for breakthrough - Viberzi not an option after CCY  4) Hx of colon polyps - Repeat colonoscopy in 2026 for surveillance  5) History of neuroendocrine tumor - Continue follow-up in the Oncology clinic   RTC in 3 months or sooner prn     I spent 32 minutes of time, including in depth chart review, independent review of results as outlined above, communicating results with the patient directly, face-to-face time with the patient, coordinating care, and ordering studies and medications as appropriate, and documentation.       Lavena Bullion ,DO, FACG 12/15/2021, 11:38 AM

## 2021-12-15 NOTE — Patient Instructions (Signed)
Stop Creon.   Start Fiber Con - 1 tablet by mouth once daily.   Follow up in 3 months. Office to call with an appointment.  See fod-map diet   _______________________________________________________  If you are age 48 or older, your body mass index should be between 23-30. Your Body mass index is 32.1 kg/m. If this is out of the aforementioned range listed, please consider follow up with your Primary Care Provider.  If you are age 49 or younger, your body mass index should be between 19-25. Your Body mass index is 32.1 kg/m. If this is out of the aformentioned range listed, please consider follow up with your Primary Care Provider.   ________________________________________________________  The [place] [facility] providers would like to encourage you to use [facility] to communicate with providers for non-urgent requests or questions.  Due to long hold times on the telephone, sending your provider a message by [facility] may be a faster and more efficient way to get a response.  Please allow 48 business hours for a response.  Please remember that this is for non-urgent requests.  _______________________________________________________  Thank you

## 2021-12-15 NOTE — Telephone Encounter (Signed)
Called to confirm appt. Pt confirmed appt. Instructed pt on proper footwear and COVID screening. Gave directions along with department number.   

## 2021-12-16 ENCOUNTER — Encounter (HOSPITAL_COMMUNITY): Payer: Self-pay

## 2021-12-16 ENCOUNTER — Encounter (HOSPITAL_COMMUNITY)
Admission: RE | Admit: 2021-12-16 | Discharge: 2021-12-16 | Disposition: A | Discharge status: Home / Self Care | Payer: No Typology Code available for payment source | Source: Ambulatory Visit | Attending: Student | Admitting: Student

## 2021-12-16 VITALS — BP 140/82 | HR 97 | Ht 63.0 in | Wt 180.8 lb

## 2021-12-16 DIAGNOSIS — R0602 Shortness of breath: Secondary | ICD-10-CM | POA: Insufficient documentation

## 2021-12-16 DIAGNOSIS — J849 Interstitial pulmonary disease, unspecified: Secondary | ICD-10-CM | POA: Insufficient documentation

## 2021-12-16 DIAGNOSIS — R062 Wheezing: Secondary | ICD-10-CM | POA: Insufficient documentation

## 2021-12-16 NOTE — Progress Notes (Signed)
Pulmonary Individual Treatment Plan  Patient Details  Name: Adjoa Althouse MRN: [id number] Date of Birth: May 27, 1975 Referring Provider:   [name] Pulmonary Rehab Walk Test from 12/16/2021 in [place]  Referring Provider [name]       Initial Encounter Date:  Flowsheet Row Pulmonary Rehab Walk Test from 12/16/2021 in [place]  Date 12/16/21       Visit Diagnosis: ILD (interstitial lung disease) ([place])  Patient's Home Medications on Admission:   Current Outpatient Medications:    acetaminophen (TYLENOL) 325 MG tablet, Take 1-2 tablets (325-650 mg total) by mouth every 4 (four) hours as needed for mild pain., Disp: , Rfl:    albuterol (VENTOLIN HFA) 108 (90 Base) MCG/ACT inhaler, Inhale 2 puffs into the lungs every 6 (six) hours as needed., Disp: , Rfl:    budesonide (PULMICORT) 0.5 MG/2ML nebulizer solution, Take 1 mL (0.25 mg total) by nebulization 2 (two) times daily., Disp: 60 mL, Rfl: 0   carvedilol (COREG) 3.125 MG tablet, Take 1 tablet (3.125 mg total) by mouth 2 (two) times daily with a meal. (Patient taking differently: Take 3.125 mg by mouth 2 (two) times daily with a meal. Taking 1 tablet at night), Disp: 60 tablet, Rfl: 0   colestipol (COLESTID) 1 g tablet, Can take 1-2 pills twice a day for diarrhea, Disp: 60 tablet, Rfl: 2   diphenoxylate-atropine (LOMOTIL) 2.5-0.025 MG tablet, Take 2 tablets by mouth 4 (four) times daily. (Patient taking differently: Take 2 tablets by mouth 4 (four) times daily as needed.), Disp: 30 tablet, Rfl: 0   gabapentin (NEURONTIN) 100 MG capsule, Take 1 capsule (100 mg total) by mouth 3 (three) times daily., Disp: 270 capsule, Rfl: 3   levETIRAcetam (KEPPRA) 750 MG tablet, Take 1 tablet every night, Disp: 90 tablet, Rfl: 3   loperamide (IMODIUM A-D) 2 MG tablet, Take 2 mg by mouth 4 (four) times daily as needed for diarrhea or loose stools., Disp: , Rfl:    Magnesium 250 MG  TABS, Take 250 mg by mouth at bedtime., Disp: , Rfl:    mometasone (NASONEX) 50 MCG/ACT nasal spray, Use one to two sprays in each nostril once daily, Disp: 17 g, Rfl: 5   Multiple Vitamin (MULTIVITAMIN WITH MINERALS) TABS tablet, Take 1 tablet by mouth daily., Disp: , Rfl:    mupirocin ointment (BACTROBAN) 2 %, Apply 1 Application topically 3 (three) times daily., Disp: 22 g, Rfl: 0   potassium chloride SA (KLOR-CON M) 20 MEQ tablet, Take 2 tablets (40 mEq total) by mouth 2 (two) times daily. Take 2 tablets twice a day for 7 days and then take 1 tablet twice a day (Patient taking differently: Take 40 mEq by mouth 2 (two) times daily. Take 2 tablets twice a day for 7 days and then take 1 tablet twice a day), Disp: 120 tablet, Rfl: 2   temazepam (RESTORIL) 15 MG capsule, Take 1 capsule (15 mg total) by mouth at bedtime as needed for sleep., Disp: 30 capsule, Rfl: 0   Tiotropium Bromide-Olodaterol (STIOLTO RESPIMAT) 2.5-2.5 MCG/ACT AERS, Inhale 2 puffs into the lungs daily., Disp: 4 g, Rfl: 0   tretinoin (RETIN-A) 0.05 % cream, Apply 1 application  topically at bedtime as needed (acne)., Disp: , Rfl:    zonisamide (ZONEGRAN) 100 MG capsule, Take 1 capsule every night, Disp: 90 capsule, Rfl: 3 No current facility-administered medications for this encounter.  Facility-Administered Medications Ordered in Other Encounters:    lanreotide  acetate (SOMATULINE DEPOT) injection 120 mg, 120 mg, Subcutaneous, Once, Volanda Napoleon, MD  Past Medical History: Past Medical History:  Diagnosis Date   Acid reflux    Allergy    Anxiety    Arthritis    lower back   ASCUS (atypical squamous cells of undetermined significance) on Pap smear 03/2011   NEG HR HPV   Asthma    Cancer ([place])    skin cancer- age 18ish   Cervical dysplasia, mild 08/2010   LGSIL colposcopy biopsy showing koilocytotic atypia   Clotting disorder ([place])    Depression    Endometriosis    Headache(784.0)    High risk HPV infection  12/2011   Pap normal   Hypertension    Pneumonia    2016ish   Seizures ([place])    seizure disorder   Smoker    Von Willebrand disease ([place])     Tobacco Use: Social History   Tobacco Use  Smoking Status Former   Packs/day: 0.50   Years: 20.00   Total pack years: 10.00   Types: Cigarettes   Quit date: 05/05/2021   Years since quitting: 0.6  Smokeless Tobacco Never    Labs: Review Flowsheet  More data exists      Latest Ref Rng & Units 05/27/2021 05/28/2021 05/29/2021 05/30/2021 05/31/2021  Labs for ITP Cardiac and Pulmonary Rehab  PH, Arterial 7.35 - 7.45 7.365  7.337  7.420  7.430  - 7.476   PCO2 arterial 32 - 48 mmHg 71.3  75.2  70.2  74.3  - 55.7   Bicarbonate 20.0 - 28.0 mmol/L 40.8  40.1  45.5  49.0  - 41.2   TCO2 22 - 32 mmol/L 43  42  48  >50  50  43   O2 Saturation % 92  92  91  89  - 98     Capillary Blood Glucose: Lab Results  Component Value Date   GLUCAP 125 (H) 06/15/2021   GLUCAP 135 (H) 06/15/2021   GLUCAP 136 (H) 06/15/2021   GLUCAP 121 (H) 06/14/2021   GLUCAP 107 (H) 06/14/2021     Pulmonary Assessment Scores:  Pulmonary Assessment Scores     Row Name 12/16/21 0935         ADL UCSD   ADL Phase Entry     SOB Score total 6       CAT Score   CAT Score 16       mMRC Score   mMRC Score 0             UCSD: Self-administered rating of dyspnea associated with activities of daily living (ADLs) 6-point scale (0 = "not at all" to 5 = "maximal or unable to do because of breathlessness")  Scoring Scores range from 0 to 120.  Minimally important difference is 5 units  CAT: CAT can identify the health impairment of COPD patients and is better correlated with disease progression.  CAT has a scoring range of zero to 40. The CAT score is classified into four groups of low (less than 10), medium (10 - 20), high (21-30) and very high (31-40) based on the impact level of disease on health status. A CAT score over 10 suggests significant symptoms.  A  worsening CAT score could be explained by an exacerbation, poor medication adherence, poor inhaler technique, or progression of COPD or comorbid conditions.  CAT MCID is 2 points  mMRC: mMRC (Modified Medical Research Council) Dyspnea Scale is used to assess the  degree of baseline functional disability in patients of respiratory disease due to dyspnea. No minimal important difference is established. A decrease in score of 1 point or greater is considered a positive change.   Pulmonary Function Assessment:  Pulmonary Function Assessment - 12/16/21 0907       Breath   Bilateral Breath Sounds Wheezes    Shortness of Breath Yes             Exercise Target Goals: Exercise Program Goal: Individual exercise prescription set using results from initial 6 min walk test and THRR while considering  patient's activity barriers and safety.   Exercise Prescription Goal: Initial exercise prescription builds to 30-45 minutes a day of aerobic activity, 2-3 days per week.  Home exercise guidelines will be given to patient during program as part of exercise prescription that the participant will acknowledge.  Activity Barriers & Risk Stratification:  Activity Barriers & Cardiac Risk Stratification - 12/16/21 0936       Activity Barriers & Cardiac Risk Stratification   Activity Barriers Deconditioning;Muscular Weakness;Shortness of Breath;Joint Problems             6 Minute Walk:  6 Minute Walk     Row Name 12/16/21 1336         6 Minute Walk   Phase Initial     Distance 1532 feet     Walk Time 6 minutes     # of Rest Breaks 0     MPH 2.9     METS 4.69     RPE 6     Perceived Dyspnea  0     VO2 Peak 16.42     Symptoms No     Resting HR 97 bpm     Resting BP 140/82     Resting Oxygen Saturation  98 %     Exercise Oxygen Saturation  during 6 min walk 95 %     Max Ex. HR 119 bpm     Max Ex. BP 160/90     2 Minute Post BP 150/88       Interval HR   1 Minute HR 107     2  Minute HR 113     3 Minute HR 117     4 Minute HR 116     5 Minute HR 117     6 Minute HR 119     2 Minute Post HR 97     Interval Heart Rate? Yes       Interval Oxygen   Interval Oxygen? Yes     Baseline Oxygen Saturation % 98 %     1 Minute Oxygen Saturation % 96 %     1 Minute Liters of Oxygen 0 L     2 Minute Oxygen Saturation % 96 %     2 Minute Liters of Oxygen 0 L     3 Minute Oxygen Saturation % 95 %     3 Minute Liters of Oxygen 0 L     4 Minute Oxygen Saturation % 95 %     4 Minute Liters of Oxygen 0 L     5 Minute Oxygen Saturation % 96 %     5 Minute Liters of Oxygen 0 L     6 Minute Oxygen Saturation % 95 %     6 Minute Liters of Oxygen 0 L     2 Minute Post Oxygen Saturation % 97 %     2 Minute Post  Liters of Oxygen 0 L              Oxygen Initial Assessment:  Oxygen Initial Assessment - 12/16/21 0934       Home Oxygen   Home Oxygen Device None    Sleep Oxygen Prescription None    Home Exercise Oxygen Prescription None    Home Resting Oxygen Prescription None      Initial 6 min Walk   Oxygen Used None      Program Oxygen Prescription   Program Oxygen Prescription None      Intervention   Short Term Goals To learn and exhibit compliance with exercise, home and travel O2 prescription;To learn and understand importance of monitoring SPO2 with pulse oximeter and demonstrate accurate use of the pulse oximeter.;To learn and understand importance of maintaining oxygen saturations>88%;To learn and demonstrate proper pursed lip breathing techniques or other breathing techniques. ;To learn and demonstrate proper use of respiratory medications    Long  Term Goals Exhibits compliance with exercise, home  and travel O2 prescription;Verbalizes importance of monitoring SPO2 with pulse oximeter and return demonstration;Maintenance of O2 saturations>88%;Exhibits proper breathing techniques, such as pursed lip breathing or other method taught during program  session;Compliance with respiratory medication;Demonstrates proper use of MDI's             Oxygen Re-Evaluation:   Oxygen Discharge (Final Oxygen Re-Evaluation):   Initial Exercise Prescription:  Initial Exercise Prescription - 12/16/21 1300       Date of Initial Exercise RX and Referring Provider   Date 12/16/21    Referring Provider [name]    Expected Discharge Date 02/23/22      Recumbant Bike   Level 2    Minutes 15    METs 3      NuStep   Level 2    SPM 70    Minutes 15    METs 2      Prescription Details   Frequency (times per week) 2    Duration Progress to 30 minutes of continuous aerobic without signs/symptoms of physical distress      Intensity   THRR 40-80% of Max Heartrate 69-138    Ratings of Perceived Exertion 11-13    Perceived Dyspnea 0-4      Progression   Progression Continue progressive overload as per policy without signs/symptoms or physical distress.      Resistance Training   Training Prescription Yes    Weight red bands    Reps 10-15             Perform Capillary Blood Glucose checks as needed.  Exercise Prescription Changes:   Exercise Comments:   Exercise Goals and Review:   Exercise Goals     Row Name 12/16/21 0936             Exercise Goals   Increase Physical Activity Yes       Intervention Provide advice, education, support and counseling about physical activity/exercise needs.;Develop an individualized exercise prescription for aerobic and resistive training based on initial evaluation findings, risk stratification, comorbidities and participant's personal goals.       Expected Outcomes Short Term: Attend rehab on a regular basis to increase amount of physical activity.;Long Term: Add in home exercise to make exercise part of routine and to increase amount of physical activity.;Long Term: Exercising regularly at least 3-5 days a week.       Increase Strength and Stamina Yes       Intervention Provide  advice,  education, support and counseling about physical activity/exercise needs.;Develop an individualized exercise prescription for aerobic and resistive training based on initial evaluation findings, risk stratification, comorbidities and participant's personal goals.       Expected Outcomes Short Term: Increase workloads from initial exercise prescription for resistance, speed, and METs.;Short Term: Perform resistance training exercises routinely during rehab and add in resistance training at home;Long Term: Improve cardiorespiratory fitness, muscular endurance and strength as measured by increased METs and functional capacity (6MWT)       Able to understand and use rate of perceived exertion (RPE) scale Yes       Intervention Provide education and explanation on how to use RPE scale       Expected Outcomes Short Term: Able to use RPE daily in rehab to express subjective intensity level;Long Term:  Able to use RPE to guide intensity level when exercising independently       Able to understand and use Dyspnea scale Yes       Intervention Provide education and explanation on how to use Dyspnea scale       Expected Outcomes Short Term: Able to use Dyspnea scale daily in rehab to express subjective sense of shortness of breath during exertion;Long Term: Able to use Dyspnea scale to guide intensity level when exercising independently       Knowledge and understanding of Target Heart Rate Range (THRR) Yes       Intervention Provide education and explanation of THRR including how the numbers were predicted and where they are located for reference       Expected Outcomes Short Term: Able to state/look up THRR;Long Term: Able to use THRR to govern intensity when exercising independently;Short Term: Able to use daily as guideline for intensity in rehab       Understanding of Exercise Prescription Yes       Intervention Provide education, explanation, and written materials on patient's individual exercise  prescription       Expected Outcomes Short Term: Able to explain program exercise prescription;Long Term: Able to explain home exercise prescription to exercise independently                Exercise Goals Re-Evaluation :   Discharge Exercise Prescription (Final Exercise Prescription Changes):   Nutrition:  Target Goals: Understanding of nutrition guidelines, daily intake of sodium <1572m, cholesterol <209m calories 30% from fat and 7% or less from saturated fats, daily to have 5 or more servings of fruits and vegetables.  Biometrics:  Pre Biometrics - 12/16/21 0854       Pre Biometrics   Grip Strength 10 kg              Nutrition Therapy Plan and Nutrition Goals:   Nutrition Assessments:  MEDIFICTS Score Key: ?70 Need to make dietary changes  40-70 Heart Healthy Diet ? 40 Therapeutic Level Cholesterol Diet   Picture Your Plate Scores: <4<61nhealthy dietary pattern with much room for improvement. 41-50 Dietary pattern unlikely to meet recommendations for good health and room for improvement. 51-60 More healthful dietary pattern, with some room for improvement.  >60 Healthy dietary pattern, although there may be some specific behaviors that could be improved.    Nutrition Goals Re-Evaluation:   Nutrition Goals Discharge (Final Nutrition Goals Re-Evaluation):   Psychosocial: Target Goals: Acknowledge presence or absence of significant depression and/or stress, maximize coping skills, provide positive support system. Participant is able to verbalize types and ability to use techniques and skills needed for reducing stress  and depression.  Initial Review & Psychosocial Screening:  Initial Psych Review & Screening - 12/16/21 0905       Initial Review   Current issues with Current Depression;Current Anxiety/Panic;Current Psychotropic Meds    Comments Is on medication for anxiety and depression      Family Dynamics   Good Support System? Yes    Comments  spouse and parents      Barriers   Psychosocial barriers to participate in program There are no identifiable barriers or psychosocial needs.      Screening Interventions   Interventions Encouraged to exercise    Expected Outcomes --             Quality of Life Scores:  Scores of 19 and below usually indicate a poorer quality of life in these areas.  A difference of  2-3 points is a clinically meaningful difference.  A difference of 2-3 points in the total score of the Quality of Life Index has been associated with significant improvement in overall quality of life, self-image, physical symptoms, and general health in studies assessing change in quality of life.  PHQ-9: Review Flowsheet       12/16/2021 11/01/2016 04/30/2015  Depression screen PHQ 2/9  Decreased Interest 0 0 0  Down, Depressed, Hopeless 1 1 0  PHQ - 2 Score 1 1 0  Altered sleeping 0 - -  Tired, decreased energy 0 - -  Change in appetite 1 - -  Feeling bad or failure about yourself  0 - -  Trouble concentrating 0 - -  Moving slowly or fidgety/restless 0 - -  Suicidal thoughts 0 - -  PHQ-9 Score 2 - -  Difficult doing work/chores Not difficult at all - -   Interpretation of Total Score  Total Score Depression Severity:  1-4 = Minimal depression, 5-9 = Mild depression, 10-14 = Moderate depression, 15-19 = Moderately severe depression, 20-27 = Severe depression   Psychosocial Evaluation and Intervention:  Psychosocial Evaluation - 12/16/21 0911       Psychosocial Evaluation & Interventions   Interventions Encouraged to exercise with the program and follow exercise prescription    Comments Saga denies any psychosocial barriers to exercising in rehab.    Expected Outcomes For Alysabeth to participate in rehab free of psychosocial concerns    Continue Psychosocial Services  No Follow up required             Psychosocial Re-Evaluation:   Psychosocial Discharge (Final Psychosocial  Re-Evaluation):   Education: Education Goals: Education classes will be provided on a weekly basis, covering required topics. Participant will state understanding/return demonstration of topics presented.  Learning Barriers/Preferences:  Learning Barriers/Preferences - 12/16/21 0916       Learning Barriers/Preferences   Learning Barriers None    Learning Preferences None             Education Topics: Introduction to Pulmonary Rehab Group instruction provided by PowerPoint, verbal discussion, and written material to support subject matter. Instructor reviews what Pulmonary Rehab is, the purpose of the program, and how patients are referred.     Know Your Numbers Group instruction that is supported by a PowerPoint presentation. Instructor discusses importance of knowing and understanding resting, exercise, and post-exercise oxygen saturation, heart rate, and blood pressure. Oxygen saturation, heart rate, blood pressure, rating of perceived exertion, and dyspnea are reviewed along with a normal range for these values.    Exercise for the Pulmonary Patient Group instruction that is supported by a  PowerPoint presentation. Instructor discusses benefits of exercise, core components of exercise, frequency, duration, and intensity of an exercise routine, importance of utilizing pulse oximetry during exercise, safety while exercising, and options of places to exercise outside of rehab.       MET Level  Group instruction provided by PowerPoint, verbal discussion, and written material to support subject matter. Instructor reviews what METs are and how to increase METs.    Pulmonary Medications Verbally interactive group education provided by instructor with focus on inhaled medications and proper administration.   Anatomy and Physiology of the Respiratory System Group instruction provided by PowerPoint, verbal discussion, and written material to support subject matter. Instructor  reviews respiratory cycle and anatomical components of the respiratory system and their functions. Instructor also reviews differences in obstructive and restrictive respiratory diseases with examples of each.    Oxygen Safety Group instruction provided by PowerPoint, verbal discussion, and written material to support subject matter. There is an overview of "What is Oxygen" and "Why do we need it".  Instructor also reviews how to create a safe environment for oxygen use, the importance of using oxygen as prescribed, and the risks of noncompliance. There is a brief discussion on traveling with oxygen and resources the patient may utilize.   Oxygen Use Group instruction provided by PowerPoint, verbal discussion, and written material to discuss how supplemental oxygen is prescribed and different types of oxygen supply systems. Resources for more information are provided.    Breathing Techniques Group instruction that is supported by demonstration and informational handouts. Instructor discusses the benefits of pursed lip and diaphragmatic breathing and detailed demonstration on how to perform both.     Risk Factor Reduction Group instruction that is supported by a PowerPoint presentation. Instructor discusses the definition of a risk factor, different risk factors for pulmonary disease, and how the heart and lungs work together.   MD Day A group question and answer session with a medical doctor that allows participants to ask questions that relate to their pulmonary disease state.   Nutrition for the Pulmonary Patient Group instruction provided by PowerPoint slides, verbal discussion, and written materials to support subject matter. The instructor gives an explanation and review of healthy diet recommendations, which includes a discussion on weight management, recommendations for fruit and vegetable consumption, as well as protein, fluid, caffeine, fiber, sodium, sugar, and alcohol. Tips for  eating when patients are short of breath are discussed.    Other Education Group or individual verbal, written, or video instructions that support the educational goals of the pulmonary rehab program.    Knowledge Questionnaire Score:  Knowledge Questionnaire Score - 12/16/21 0924       Knowledge Questionnaire Score   Pre Score 14/18             Core Components/Risk Factors/Patient Goals at Admission:  Personal Goals and Risk Factors at Admission - 12/16/21 0924       Core Components/Risk Factors/Patient Goals on Admission    Weight Management Weight Loss   Low FODMAP   Improve shortness of breath with ADL's Yes    Intervention Provide education, individualized exercise plan and daily activity instruction to help decrease symptoms of SOB with activities of daily living.    Expected Outcomes Short Term: Improve cardiorespiratory fitness to achieve a reduction of symptoms when performing ADLs;Long Term: Be able to perform more ADLs without symptoms or delay the onset of symptoms             Core Components/Risk  Factors/Patient Goals Review:    Core Components/Risk Factors/Patient Goals at Discharge (Final Review):    ITP Comments: Dr. [name] is Medical Director for Pulmonary Rehab at [facility].

## 2021-12-16 NOTE — Progress Notes (Signed)
Cassidy Tran 46 y.o. female Pulmonary Rehab Orientation Note This patient who was referred to Pulmonary Rehab by Dr. Verlee Monte with the diagnosis of ILD arrived today in Cardiac and Pulmonary Rehab. She  arrived ambulatory with normal gait. She  does not carry portable oxygen. Per patient, Cassidy Tran uses oxygen never. Color good, skin warm and dry. Patient is oriented to time and place. Patient's medical history, psychosocial health, and medications reviewed. Psychosocial assessment reveals patient lives with spouse. Cassidy Tran is currently unemployed, disabled. Patient hobbies include spending time with others. Patient reports her stress level is moderate. Areas of stress/anxiety include health and finances. Patient does not exhibit signs of depression. PHQ2/9 score 1/2. Cassidy Tran shows good  coping skills with positive outlook on life. Offered emotional support and reassurance. Will continue to monitor. Physical assessment performed by Cassidy Small RN. Please see their orientation physical assessment note. Cassidy Tran reports she does take medications as prescribed. Patient states she follows a diet recommended by her GI doctor. The patient reports no specific efforts to gain or lose weight.. Patient's weight will be monitored closely. Demonstration and practice of PLB using pulse oximeter. Cassidy Tran able to return demonstration satisfactorily. Safety and hand hygiene in the exercise area reviewed with patient. Cassidy Tran voices understanding of the information reviewed. Department expectations discussed with patient and achievable goals were set. The patient shows enthusiasm about attending the program and we look forward to working with Cassidy Tran. Cassidy Tran completed a 6 min walk test today and is scheduled to begin exercise on 12/22/21 at 1:15 pm.   [id number] Sheppard Plumber, MS, ACSM-CEP

## 2021-12-16 NOTE — Progress Notes (Addendum)
Pulmonary Rehab Orientation Physical Assessment Note  Physical assessment reveals  Pt is alert and oriented x 3.  Heart rate is tachycardic, breath sounds diminished throughout with , mild expiratory wheezing heard RLL and faint crackles left upper lobe characteristic of ILD. Reports productive cough. Bowel sounds present.  Pt endorses diarrhea of unknown origin that is is chronic in nature.  Currently undergoing GI work up. Denies abdominal discomfort, nausea, vomiting. Grip strength equal, strong. Distal pulses palpable however pt does complain of bilateral neuropathy; no swelling to lower extremities. Cherre Huger, BSN Cardiac and Training and development officer

## 2021-12-19 ENCOUNTER — Other Ambulatory Visit: Payer: Self-pay

## 2021-12-19 DIAGNOSIS — G629 Polyneuropathy, unspecified: Secondary | ICD-10-CM

## 2021-12-21 NOTE — Progress Notes (Signed)
Pulmonary Individual Treatment Plan  Patient Details  Name: Cassidy Tran MRN: [id number] Date of Birth: 1975/08/05 Referring Provider:   [name] Pulmonary Rehab Walk Test from 12/16/2021 in [facility]  Referring Provider [name]       Initial Encounter Date:  Flowsheet Row Pulmonary Rehab Walk Test from 12/16/2021 in [facility]  Date 12/16/21       Visit Diagnosis: ILD (interstitial lung disease) ([place])  Patient's Home Medications on Admission:   Current Outpatient Medications:    acetaminophen (TYLENOL) 325 MG tablet, Take 1-2 tablets (325-650 mg total) by mouth every 4 (four) hours as needed for mild pain., Disp: , Rfl:    albuterol (VENTOLIN HFA) 108 (90 Base) MCG/ACT inhaler, Inhale 2 puffs into the lungs every 6 (six) hours as needed., Disp: , Rfl:    budesonide (PULMICORT) 0.5 MG/2ML nebulizer solution, Take 1 mL (0.25 mg total) by nebulization 2 (two) times daily., Disp: 60 mL, Rfl: 0   carvedilol (COREG) 3.125 MG tablet, Take 1 tablet (3.125 mg total) by mouth 2 (two) times daily with a meal. (Patient taking differently: Take 3.125 mg by mouth 2 (two) times daily with a meal. Taking 1 tablet at night), Disp: 60 tablet, Rfl: 0   colestipol (COLESTID) 1 g tablet, Can take 1-2 pills twice a day for diarrhea, Disp: 60 tablet, Rfl: 2   diphenoxylate-atropine (LOMOTIL) 2.5-0.025 MG tablet, Take 2 tablets by mouth 4 (four) times daily. (Patient taking differently: Take 2 tablets by mouth 4 (four) times daily as needed.), Disp: 30 tablet, Rfl: 0   gabapentin (NEURONTIN) 100 MG capsule, Take 1 capsule (100 mg total) by mouth 3 (three) times daily., Disp: 270 capsule, Rfl: 3   levETIRAcetam (KEPPRA) 750 MG tablet, Take 1 tablet every night, Disp: 90 tablet, Rfl: 3   loperamide (IMODIUM A-D) 2 MG tablet, Take 2 mg by mouth 4 (four) times daily as needed for diarrhea or loose stools., Disp: , Rfl:    Magnesium 250 MG  TABS, Take 250 mg by mouth at bedtime., Disp: , Rfl:    mometasone (NASONEX) 50 MCG/ACT nasal spray, Use one to two sprays in each nostril once daily, Disp: 17 g, Rfl: 5   Multiple Vitamin (MULTIVITAMIN WITH MINERALS) TABS tablet, Take 1 tablet by mouth daily., Disp: , Rfl:    mupirocin ointment (BACTROBAN) 2 %, Apply 1 Application topically 3 (three) times daily., Disp: 22 g, Rfl: 0   potassium chloride SA (KLOR-CON M) 20 MEQ tablet, Take 2 tablets (40 mEq total) by mouth 2 (two) times daily. Take 2 tablets twice a day for 7 days and then take 1 tablet twice a day (Patient taking differently: Take 40 mEq by mouth 2 (two) times daily. Take 2 tablets twice a day for 7 days and then take 1 tablet twice a day), Disp: 120 tablet, Rfl: 2   temazepam (RESTORIL) 15 MG capsule, Take 1 capsule (15 mg total) by mouth at bedtime as needed for sleep., Disp: 30 capsule, Rfl: 0   Tiotropium Bromide-Olodaterol (STIOLTO RESPIMAT) 2.5-2.5 MCG/ACT AERS, Inhale 2 puffs into the lungs daily., Disp: 4 g, Rfl: 0   tretinoin (RETIN-A) 0.05 % cream, Apply 1 application  topically at bedtime as needed (acne)., Disp: , Rfl:    zonisamide (ZONEGRAN) 100 MG capsule, Take 1 capsule every night, Disp: 90 capsule, Rfl: 3 No current facility-administered medications for this encounter.  Facility-Administered Medications Ordered in Other Encounters:    lanreotide  acetate (SOMATULINE DEPOT) injection 120 mg, 120 mg, Subcutaneous, Once, Volanda Napoleon, MD  Past Medical History: Past Medical History:  Diagnosis Date   Acid reflux    Allergy    Anxiety    Arthritis    lower back   ASCUS (atypical squamous cells of undetermined significance) on Pap smear 03/2011   NEG HR HPV   Asthma    Cancer ([place])    skin cancer- age 18ish   Cervical dysplasia, mild 08/2010   LGSIL colposcopy biopsy showing koilocytotic atypia   Clotting disorder ([place])    Depression    Endometriosis    Headache(784.0)    High risk HPV infection  12/2011   Pap normal   Hypertension    Pneumonia    2016ish   Seizures ([place])    seizure disorder   Smoker    Von Willebrand disease ([place])     Tobacco Use: Social History   Tobacco Use  Smoking Status Former   Packs/day: 0.50   Years: 20.00   Total pack years: 10.00   Types: Cigarettes   Quit date: 05/05/2021   Years since quitting: 0.6  Smokeless Tobacco Never    Labs: Review Flowsheet  More data exists      Latest Ref Rng & Units 05/27/2021 05/28/2021 05/29/2021 05/30/2021 05/31/2021  Labs for ITP Cardiac and Pulmonary Rehab  PH, Arterial 7.35 - 7.45 7.365  7.337  7.420  7.430  - 7.476   PCO2 arterial 32 - 48 mmHg 71.3  75.2  70.2  74.3  - 55.7   Bicarbonate 20.0 - 28.0 mmol/L 40.8  40.1  45.5  49.0  - 41.2   TCO2 22 - 32 mmol/L 43  42  48  >50  50  43   O2 Saturation % 92  92  91  89  - 98     Capillary Blood Glucose: Lab Results  Component Value Date   GLUCAP 125 (H) 06/15/2021   GLUCAP 135 (H) 06/15/2021   GLUCAP 136 (H) 06/15/2021   GLUCAP 121 (H) 06/14/2021   GLUCAP 107 (H) 06/14/2021     Pulmonary Assessment Scores:  Pulmonary Assessment Scores     Row Name 12/16/21 0935         ADL UCSD   ADL Phase Entry     SOB Score total 6       CAT Score   CAT Score 16       mMRC Score   mMRC Score 0             UCSD: Self-administered rating of dyspnea associated with activities of daily living (ADLs) 6-point scale (0 = "not at all" to 5 = "maximal or unable to do because of breathlessness")  Scoring Scores range from 0 to 120.  Minimally important difference is 5 units  CAT: CAT can identify the health impairment of COPD patients and is better correlated with disease progression.  CAT has a scoring range of zero to 40. The CAT score is classified into four groups of low (less than 10), medium (10 - 20), high (21-30) and very high (31-40) based on the impact level of disease on health status. A CAT score over 10 suggests significant symptoms.  A  worsening CAT score could be explained by an exacerbation, poor medication adherence, poor inhaler technique, or progression of COPD or comorbid conditions.  CAT MCID is 2 points  mMRC: mMRC (Modified Medical Research Council) Dyspnea Scale is used to assess the  degree of baseline functional disability in patients of respiratory disease due to dyspnea. No minimal important difference is established. A decrease in score of 1 point or greater is considered a positive change.   Pulmonary Function Assessment:  Pulmonary Function Assessment - 12/16/21 0907       Breath   Bilateral Breath Sounds Wheezes    Shortness of Breath Yes             Exercise Target Goals: Exercise Program Goal: Individual exercise prescription set using results from initial 6 min walk test and THRR while considering  patient's activity barriers and safety.   Exercise Prescription Goal: Initial exercise prescription builds to 30-45 minutes a day of aerobic activity, 2-3 days per week.  Home exercise guidelines will be given to patient during program as part of exercise prescription that the participant will acknowledge.  Activity Barriers & Risk Stratification:  Activity Barriers & Cardiac Risk Stratification - 12/16/21 0936       Activity Barriers & Cardiac Risk Stratification   Activity Barriers Deconditioning;Muscular Weakness;Shortness of Breath;Joint Problems             6 Minute Walk:  6 Minute Walk     Row Name 12/16/21 1336         6 Minute Walk   Phase Initial     Distance 1532 feet     Walk Time 6 minutes     # of Rest Breaks 0     MPH 2.9     METS 4.69     RPE 6     Perceived Dyspnea  0     VO2 Peak 16.42     Symptoms No     Resting HR 97 bpm     Resting BP 140/82     Resting Oxygen Saturation  98 %     Exercise Oxygen Saturation  during 6 min walk 95 %     Max Ex. HR 119 bpm     Max Ex. BP 160/90     2 Minute Post BP 150/88       Interval HR   1 Minute HR 107     2  Minute HR 113     3 Minute HR 117     4 Minute HR 116     5 Minute HR 117     6 Minute HR 119     2 Minute Post HR 97     Interval Heart Rate? Yes       Interval Oxygen   Interval Oxygen? Yes     Baseline Oxygen Saturation % 98 %     1 Minute Oxygen Saturation % 96 %     1 Minute Liters of Oxygen 0 L     2 Minute Oxygen Saturation % 96 %     2 Minute Liters of Oxygen 0 L     3 Minute Oxygen Saturation % 95 %     3 Minute Liters of Oxygen 0 L     4 Minute Oxygen Saturation % 95 %     4 Minute Liters of Oxygen 0 L     5 Minute Oxygen Saturation % 96 %     5 Minute Liters of Oxygen 0 L     6 Minute Oxygen Saturation % 95 %     6 Minute Liters of Oxygen 0 L     2 Minute Post Oxygen Saturation % 97 %     2 Minute Post  Liters of Oxygen 0 L              Oxygen Initial Assessment:  Oxygen Initial Assessment - 12/16/21 0934       Home Oxygen   Home Oxygen Device None    Sleep Oxygen Prescription None    Home Exercise Oxygen Prescription None    Home Resting Oxygen Prescription None      Initial 6 min Walk   Oxygen Used None      Program Oxygen Prescription   Program Oxygen Prescription None      Intervention   Short Term Goals To learn and exhibit compliance with exercise, home and travel O2 prescription;To learn and understand importance of monitoring SPO2 with pulse oximeter and demonstrate accurate use of the pulse oximeter.;To learn and understand importance of maintaining oxygen saturations>88%;To learn and demonstrate proper pursed lip breathing techniques or other breathing techniques. ;To learn and demonstrate proper use of respiratory medications    Long  Term Goals Exhibits compliance with exercise, home  and travel O2 prescription;Verbalizes importance of monitoring SPO2 with pulse oximeter and return demonstration;Maintenance of O2 saturations>88%;Exhibits proper breathing techniques, such as pursed lip breathing or other method taught during program  session;Compliance with respiratory medication;Demonstrates proper use of MDI's             Oxygen Re-Evaluation:  Oxygen Re-Evaluation     Row Name 12/19/21 0821             Program Oxygen Prescription   Program Oxygen Prescription None         Home Oxygen   Home Oxygen Device None       Sleep Oxygen Prescription None       Home Exercise Oxygen Prescription None       Home Resting Oxygen Prescription None         Goals/Expected Outcomes   Short Term Goals To learn and exhibit compliance with exercise, home and travel O2 prescription;To learn and understand importance of monitoring SPO2 with pulse oximeter and demonstrate accurate use of the pulse oximeter.;To learn and understand importance of maintaining oxygen saturations>88%;To learn and demonstrate proper pursed lip breathing techniques or other breathing techniques. ;To learn and demonstrate proper use of respiratory medications       Long  Term Goals Exhibits compliance with exercise, home  and travel O2 prescription;Verbalizes importance of monitoring SPO2 with pulse oximeter and return demonstration;Maintenance of O2 saturations>88%;Exhibits proper breathing techniques, such as pursed lip breathing or other method taught during program session;Compliance with respiratory medication;Demonstrates proper use of MDI's       Goals/Expected Outcomes Compliance and understanding of oxygen saturation monitoring and breathing techniques to decrease shortness of breath.                Oxygen Discharge (Final Oxygen Re-Evaluation):  Oxygen Re-Evaluation - 12/19/21 0821       Program Oxygen Prescription   Program Oxygen Prescription None      Home Oxygen   Home Oxygen Device None    Sleep Oxygen Prescription None    Home Exercise Oxygen Prescription None    Home Resting Oxygen Prescription None      Goals/Expected Outcomes   Short Term Goals To learn and exhibit compliance with exercise, home and travel O2  prescription;To learn and understand importance of monitoring SPO2 with pulse oximeter and demonstrate accurate use of the pulse oximeter.;To learn and understand importance of maintaining oxygen saturations>88%;To learn and demonstrate proper pursed lip breathing techniques or other  breathing techniques. ;To learn and demonstrate proper use of respiratory medications    Long  Term Goals Exhibits compliance with exercise, home  and travel O2 prescription;Verbalizes importance of monitoring SPO2 with pulse oximeter and return demonstration;Maintenance of O2 saturations>88%;Exhibits proper breathing techniques, such as pursed lip breathing or other method taught during program session;Compliance with respiratory medication;Demonstrates proper use of MDI's    Goals/Expected Outcomes Compliance and understanding of oxygen saturation monitoring and breathing techniques to decrease shortness of breath.             Initial Exercise Prescription:  Initial Exercise Prescription - 12/16/21 1300       Date of Initial Exercise RX and Referring Provider   Date 12/16/21    Referring Provider [name]    Expected Discharge Date 02/23/22      Recumbant Bike   Level 2    Minutes 15    METs 3      NuStep   Level 2    SPM 70    Minutes 15    METs 2      Prescription Details   Frequency (times per week) 2    Duration Progress to 30 minutes of continuous aerobic without signs/symptoms of physical distress      Intensity   THRR 40-80% of Max Heartrate 69-138    Ratings of Perceived Exertion 11-13    Perceived Dyspnea 0-4      Progression   Progression Continue progressive overload as per policy without signs/symptoms or physical distress.      Resistance Training   Training Prescription Yes    Weight red bands    Reps 10-15             Perform Capillary Blood Glucose checks as needed.  Exercise Prescription Changes:   Exercise Comments:   Exercise Goals and Review:   Exercise  Goals     Row Name 12/16/21 0936 12/19/21 0820           Exercise Goals   Increase Physical Activity Yes Yes      Intervention Provide advice, education, support and counseling about physical activity/exercise needs.;Develop an individualized exercise prescription for aerobic and resistive training based on initial evaluation findings, risk stratification, comorbidities and participant's personal goals. Provide advice, education, support and counseling about physical activity/exercise needs.;Develop an individualized exercise prescription for aerobic and resistive training based on initial evaluation findings, risk stratification, comorbidities and participant's personal goals.      Expected Outcomes Short Term: Attend rehab on a regular basis to increase amount of physical activity.;Long Term: Add in home exercise to make exercise part of routine and to increase amount of physical activity.;Long Term: Exercising regularly at least 3-5 days a week. Short Term: Attend rehab on a regular basis to increase amount of physical activity.;Long Term: Add in home exercise to make exercise part of routine and to increase amount of physical activity.;Long Term: Exercising regularly at least 3-5 days a week.      Increase Strength and Stamina Yes Yes      Intervention Provide advice, education, support and counseling about physical activity/exercise needs.;Develop an individualized exercise prescription for aerobic and resistive training based on initial evaluation findings, risk stratification, comorbidities and participant's personal goals. Provide advice, education, support and counseling about physical activity/exercise needs.;Develop an individualized exercise prescription for aerobic and resistive training based on initial evaluation findings, risk stratification, comorbidities and participant's personal goals.      Expected Outcomes Short Term: Increase workloads from initial exercise  prescription for  resistance, speed, and METs.;Short Term: Perform resistance training exercises routinely during rehab and add in resistance training at home;Long Term: Improve cardiorespiratory fitness, muscular endurance and strength as measured by increased METs and functional capacity (6MWT) Short Term: Increase workloads from initial exercise prescription for resistance, speed, and METs.;Short Term: Perform resistance training exercises routinely during rehab and add in resistance training at home;Long Term: Improve cardiorespiratory fitness, muscular endurance and strength as measured by increased METs and functional capacity (6MWT)      Able to understand and use rate of perceived exertion (RPE) scale Yes Yes      Intervention Provide education and explanation on how to use RPE scale Provide education and explanation on how to use RPE scale      Expected Outcomes Short Term: Able to use RPE daily in rehab to express subjective intensity level;Long Term:  Able to use RPE to guide intensity level when exercising independently Short Term: Able to use RPE daily in rehab to express subjective intensity level;Long Term:  Able to use RPE to guide intensity level when exercising independently      Able to understand and use Dyspnea scale Yes Yes      Intervention Provide education and explanation on how to use Dyspnea scale Provide education and explanation on how to use Dyspnea scale      Expected Outcomes Short Term: Able to use Dyspnea scale daily in rehab to express subjective sense of shortness of breath during exertion;Long Term: Able to use Dyspnea scale to guide intensity level when exercising independently Short Term: Able to use Dyspnea scale daily in rehab to express subjective sense of shortness of breath during exertion;Long Term: Able to use Dyspnea scale to guide intensity level when exercising independently      Knowledge and understanding of Target Heart Rate Range (THRR) Yes Yes      Intervention Provide  education and explanation of THRR including how the numbers were predicted and where they are located for reference Provide education and explanation of THRR including how the numbers were predicted and where they are located for reference      Expected Outcomes Short Term: Able to state/look up THRR;Long Term: Able to use THRR to govern intensity when exercising independently;Short Term: Able to use daily as guideline for intensity in rehab Short Term: Able to state/look up THRR;Long Term: Able to use THRR to govern intensity when exercising independently;Short Term: Able to use daily as guideline for intensity in rehab      Understanding of Exercise Prescription Yes Yes      Intervention Provide education, explanation, and written materials on patient's individual exercise prescription Provide education, explanation, and written materials on patient's individual exercise prescription      Expected Outcomes Short Term: Able to explain program exercise prescription;Long Term: Able to explain home exercise prescription to exercise independently Short Term: Able to explain program exercise prescription;Long Term: Able to explain home exercise prescription to exercise independently               Exercise Goals Re-Evaluation :  Exercise Goals Re-Evaluation     Row Name 12/19/21 0821             Exercise Goal Re-Evaluation   Exercise Goals Review Increase Physical Activity;Increase Strength and Stamina;Able to understand and use rate of perceived exertion (RPE) scale;Able to understand and use Dyspnea scale;Knowledge and understanding of Target Heart Rate Range (THRR);Understanding of Exercise Prescription       Comments  Earl is scheduled to begin exercise this week.       Expected Outcomes Through exercise at rehab and home, the patient will decrease shortness of breath with daily activities and feel confident in carrying out an exercise regimen at home.                Discharge  Exercise Prescription (Final Exercise Prescription Changes):   Nutrition:  Target Goals: Understanding of nutrition guidelines, daily intake of sodium <1576m, cholesterol <2043m calories 30% from fat and 7% or less from saturated fats, daily to have 5 or more servings of fruits and vegetables.  Biometrics:  Pre Biometrics - 12/16/21 0854       Pre Biometrics   Grip Strength 10 kg              Nutrition Therapy Plan and Nutrition Goals:   Nutrition Assessments:  MEDIFICTS Score Key: ?70 Need to make dietary changes  40-70 Heart Healthy Diet ? 40 Therapeutic Level Cholesterol Diet   Picture Your Plate Scores: <4<76nhealthy dietary pattern with much room for improvement. 41-50 Dietary pattern unlikely to meet recommendations for good health and room for improvement. 51-60 More healthful dietary pattern, with some room for improvement.  >60 Healthy dietary pattern, although there may be some specific behaviors that could be improved.    Nutrition Goals Re-Evaluation:   Nutrition Goals Discharge (Final Nutrition Goals Re-Evaluation):   Psychosocial: Target Goals: Acknowledge presence or absence of significant depression and/or stress, maximize coping skills, provide positive support system. Participant is able to verbalize types and ability to use techniques and skills needed for reducing stress and depression.  Initial Review & Psychosocial Screening:  Initial Psych Review & Screening - 12/16/21 0905       Initial Review   Current issues with History of Depression;Current Anxiety/Panic;Current Psychotropic Meds    Comments Is on medication for anxiety and depression      Family Dynamics   Good Support System? Yes    Comments spouse and parents      Barriers   Psychosocial barriers to participate in program There are no identifiable barriers or psychosocial needs.      Screening Interventions   Interventions Encouraged to exercise    Expected Outcomes --              Quality of Life Scores:  Scores of 19 and below usually indicate a poorer quality of life in these areas.  A difference of  2-3 points is a clinically meaningful difference.  A difference of 2-3 points in the total score of the Quality of Life Index has been associated with significant improvement in overall quality of life, self-image, physical symptoms, and general health in studies assessing change in quality of life.  PHQ-9: Review Flowsheet       12/16/2021 11/01/2016 04/30/2015  Depression screen PHQ 2/9  Decreased Interest 0 0 0  Down, Depressed, Hopeless 1 1 0  PHQ - 2 Score 1 1 0  Altered sleeping 0 - -  Tired, decreased energy 0 - -  Change in appetite 1 - -  Feeling bad or failure about yourself  0 - -  Trouble concentrating 0 - -  Moving slowly or fidgety/restless 0 - -  Suicidal thoughts 0 - -  PHQ-9 Score 2 - -  Difficult doing work/chores Not difficult at all - -   Interpretation of Total Score  Total Score Depression Severity:  1-4 = Minimal depression, 5-9 = Mild depression, 10-14 =  Moderate depression, 15-19 = Moderately severe depression, 20-27 = Severe depression   Psychosocial Evaluation and Intervention:  Psychosocial Evaluation - 12/16/21 0911       Psychosocial Evaluation & Interventions   Interventions Encouraged to exercise with the program and follow exercise prescription    Comments Eleah denies any psychosocial barriers to exercising in rehab. She feels that her anxiety and depression are managed well with medication.    Expected Outcomes For [place] to participate in rehab free of psychosocial concerns    Continue Psychosocial Services  No Follow up required             Psychosocial Re-Evaluation:  Psychosocial Re-Evaluation     [place] Name 12/19/21 0822             Psychosocial Re-Evaluation   Current issues with Current Anxiety/Panic;Current Psychotropic Meds;History of Depression       Comments Lareen does not  feel she had any psychosocial concerns to participating in rehab. She is on medication       Expected Outcomes For Mychele to participate in rehab free of psychosocial barriers.       Interventions Encouraged to attend Pulmonary Rehabilitation for the exercise       Continue Psychosocial Services  No Follow up required                Psychosocial Discharge (Final Psychosocial Re-Evaluation):  Psychosocial Re-Evaluation - 12/19/21 5427       Psychosocial Re-Evaluation   Current issues with Current Anxiety/Panic;Current Psychotropic Meds;History of Depression    Comments Alessa does not feel she had any psychosocial concerns to participating in rehab. She is on medication    Expected Outcomes For Naava to participate in rehab free of psychosocial barriers.    Interventions Encouraged to attend Pulmonary Rehabilitation for the exercise    Continue Psychosocial Services  No Follow up required             Education: Education Goals: Education classes will be provided on a weekly basis, covering required topics. Participant will state understanding/return demonstration of topics presented.  Learning Barriers/Preferences:  Learning Barriers/Preferences - 12/16/21 0916       Learning Barriers/Preferences   Learning Barriers None    Learning Preferences None             Education Topics: Introduction to Pulmonary Rehab Group instruction provided by PowerPoint, verbal discussion, and written material to support subject matter. Instructor reviews what Pulmonary Rehab is, the purpose of the program, and how patients are referred.     Know Your Numbers Group instruction that is supported by a PowerPoint presentation. Instructor discusses importance of knowing and understanding resting, exercise, and post-exercise oxygen saturation, heart rate, and blood pressure. Oxygen saturation, heart rate, blood pressure, rating of perceived exertion, and dyspnea are reviewed along  with a normal range for these values.    Exercise for the Pulmonary Patient Group instruction that is supported by a PowerPoint presentation. Instructor discusses benefits of exercise, core components of exercise, frequency, duration, and intensity of an exercise routine, importance of utilizing pulse oximetry during exercise, safety while exercising, and options of places to exercise outside of rehab.       MET Level  Group instruction provided by PowerPoint, verbal discussion, and written material to support subject matter. Instructor reviews what METs are and how to increase METs.    Pulmonary Medications Verbally interactive group education provided by instructor with focus on inhaled medications and proper administration.   Anatomy  and Physiology of the Respiratory System Group instruction provided by PowerPoint, verbal discussion, and written material to support subject matter. Instructor reviews respiratory cycle and anatomical components of the respiratory system and their functions. Instructor also reviews differences in obstructive and restrictive respiratory diseases with examples of each.    Oxygen Safety Group instruction provided by PowerPoint, verbal discussion, and written material to support subject matter. There is an overview of "What is Oxygen" and "Why do we need it".  Instructor also reviews how to create a safe environment for oxygen use, the importance of using oxygen as prescribed, and the risks of noncompliance. There is a brief discussion on traveling with oxygen and resources the patient may utilize.   Oxygen Use Group instruction provided by PowerPoint, verbal discussion, and written material to discuss how supplemental oxygen is prescribed and different types of oxygen supply systems. Resources for more information are provided.    Breathing Techniques Group instruction that is supported by demonstration and informational handouts. Instructor discusses the  benefits of pursed lip and diaphragmatic breathing and detailed demonstration on how to perform both.     Risk Factor Reduction Group instruction that is supported by a PowerPoint presentation. Instructor discusses the definition of a risk factor, different risk factors for pulmonary disease, and how the heart and lungs work together.   MD Day A group question and answer session with a medical doctor that allows participants to ask questions that relate to their pulmonary disease state.   Nutrition for the Pulmonary Patient Group instruction provided by PowerPoint slides, verbal discussion, and written materials to support subject matter. The instructor gives an explanation and review of healthy diet recommendations, which includes a discussion on weight management, recommendations for fruit and vegetable consumption, as well as protein, fluid, caffeine, fiber, sodium, sugar, and alcohol. Tips for eating when patients are short of breath are discussed.    Other Education Group or individual verbal, written, or video instructions that support the educational goals of the pulmonary rehab program.    Knowledge Questionnaire Score:  Knowledge Questionnaire Score - 12/16/21 0924       Knowledge Questionnaire Score   Pre Score 14/18             Core Components/Risk Factors/Patient Goals at Admission:  Personal Goals and Risk Factors at Admission - 12/16/21 0924       Core Components/Risk Factors/Patient Goals on Admission    Weight Management Weight Loss   Low FODMAP   Improve shortness of breath with ADL's Yes    Intervention Provide education, individualized exercise plan and daily activity instruction to help decrease symptoms of SOB with activities of daily living.    Expected Outcomes Short Term: Improve cardiorespiratory fitness to achieve a reduction of symptoms when performing ADLs;Long Term: Be able to perform more ADLs without symptoms or delay the onset of symptoms              Core Components/Risk Factors/Patient Goals Review:   Goals and Risk Factor Review     Row Name 12/19/21 3557             Core Components/Risk Factors/Patient Goals Review   Personal Goals Review Weight Management/Obesity;Improve shortness of breath with ADL's;Develop more efficient breathing techniques such as purse lipped breathing and diaphragmatic breathing and practicing self-pacing with activity.       Review Ashrita is scheduled to begin exercise this week.       Expected Outcomes See admission goals  Core Components/Risk Factors/Patient Goals at Discharge (Final Review):   Goals and Risk Factor Review - 12/19/21 0832       Core Components/Risk Factors/Patient Goals Review   Personal Goals Review Weight Management/Obesity;Improve shortness of breath with ADL's;Develop more efficient breathing techniques such as purse lipped breathing and diaphragmatic breathing and practicing self-pacing with activity.    Review Keriann is scheduled to begin exercise this week.    Expected Outcomes See admission goals             ITP Comments: ITP REVIEW Pt is making expected progress toward pulmonary rehab goals after completing 0 sessions. Recommend continued exercise, life style modification, education, and utilization of breathing techniques to increase stamina and strength and decrease shortness of breath with exertion.   Comments: Dr. [name] is Medical Director for Pulmonary Rehab at [facility]

## 2021-12-22 ENCOUNTER — Encounter (HOSPITAL_COMMUNITY)
Admission: RE | Admit: 2021-12-22 | Discharge: 2021-12-22 | Disposition: A | Discharge status: Home / Self Care | Payer: No Typology Code available for payment source | Source: Ambulatory Visit | Attending: Student | Admitting: Student

## 2021-12-22 DIAGNOSIS — J849 Interstitial pulmonary disease, unspecified: Secondary | ICD-10-CM | POA: Diagnosis not present

## 2021-12-27 ENCOUNTER — Encounter (HOSPITAL_COMMUNITY): Payer: No Typology Code available for payment source

## 2021-12-29 ENCOUNTER — Encounter (HOSPITAL_COMMUNITY)
Admission: RE | Admit: 2021-12-29 | Discharge: 2021-12-29 | Disposition: A | Discharge status: Home / Self Care | Payer: No Typology Code available for payment source | Source: Ambulatory Visit | Attending: Student | Admitting: Student

## 2021-12-29 DIAGNOSIS — J849 Interstitial pulmonary disease, unspecified: Secondary | ICD-10-CM | POA: Insufficient documentation

## 2021-12-29 NOTE — Progress Notes (Signed)
Daily Session Note  Patient Details  Name: Cassidy Tran MRN: [id number] Date of Birth: June 23, 1975 Referring Provider:   [name] Pulmonary Rehab Walk Test from 12/16/2021 in [place]  Referring Provider [name]       Encounter Date: 12/29/2021  Check In:  Session Check In - 12/29/21 1441       Check-In   Supervising physician immediately available to respond to emergencies [facility] - Physician supervision    Physician(s) Dr. Erin Fulling    Location [facility]    Staff Present Sandy Salaam, MS, Exercise Physiologist;Lisa Clarisa Schools, MS, ACSM-CEP, Exercise Physiologist;David Lilyan Punt, MS, ACSM-CEP, CCRP, Exercise Physiologist    Virtual Visit No    Medication changes reported     No    Fall or balance concerns reported    No    Tobacco Cessation No Change    Warm-up and Cool-down Performed as group-led instruction    Resistance Training Performed Yes    VAD Patient? No    PAD/SET Patient? No      Pain Assessment   Currently in Pain? No/denies    Multiple Pain Sites No             Capillary Blood Glucose: No results found for this or any previous visit (from the past 24 hour(s)).    Social History   Tobacco Use  Smoking Status Former   Packs/day: 0.50   Years: 20.00   Total pack years: 10.00   Types: Cigarettes   Quit date: 05/05/2021   Years since quitting: 0.6  Smokeless Tobacco Never    Goals Met:  Proper associated with RPD/PD & O2 Sat Independence with exercise equipment Exercise tolerated well No report of concerns or symptoms today Strength training completed today  Goals Unmet:  Not Applicable  Comments: Service time is from 1310 to 1454.    Dr. [name] is Medical Director for Pulmonary Rehab at [facility].

## 2022-01-03 ENCOUNTER — Encounter (HOSPITAL_COMMUNITY)
Admission: RE | Admit: 2022-01-03 | Discharge: 2022-01-03 | Disposition: A | Discharge status: Home / Self Care | Payer: No Typology Code available for payment source | Source: Ambulatory Visit | Attending: Student | Admitting: Student

## 2022-01-03 VITALS — Wt 178.4 lb

## 2022-01-03 DIAGNOSIS — J849 Interstitial pulmonary disease, unspecified: Secondary | ICD-10-CM

## 2022-01-03 NOTE — Progress Notes (Signed)
Daily Session Note  Patient Details  Name: Cassidy Tran MRN: [id number] Date of Birth: April 03, 1975 Referring Provider:   [name] Pulmonary Rehab Walk Test from 12/16/2021 in [place]  Referring Provider [name]       Encounter Date: 01/03/2022  Check In:  Session Check In - 01/03/22 1452       Check-In   Supervising physician immediately available to respond to emergencies [facility] - Physician supervision    Physician(s) Dr. Erskine Emery    Location [facility]    Staff Present Rodney Langton, RN;David Lilyan Punt, MS, ACSM-CEP, CCRP, Exercise Physiologist;Kaylee Rosana Hoes, MS, ACSM-CEP, Exercise Physiologist    Virtual Visit No    Medication changes reported     No    Fall or balance concerns reported    No    Tobacco Cessation No Change    Warm-up and Cool-down Performed as group-led instruction    Resistance Training Performed Yes    VAD Patient? No      Pain Assessment   Currently in Pain? Yes    Pain Score 6     Pain Location Abdomen    Pain Orientation Right    Pain Descriptors / Indicators Constant;Sharp    Pain Type Acute pain    Pain Onset Today    Pain Frequency Constant    Aggravating Factors  nothing that she knows has made it worse    Pain Relieving Factors nothing    Multiple Pain Sites No             Capillary Blood Glucose: No results found for this or any previous visit (from the past 24 hour(s)).   Exercise Prescription Changes - 01/03/22 1500       Response to Exercise   Blood Pressure (Admit) 140/90    Blood Pressure (Exercise) 156/80    Blood Pressure (Exit) 138/84    Heart Rate (Admit) 99 bpm    Heart Rate (Exercise) 130 bpm    Heart Rate (Exit) 103 bpm    Oxygen Saturation (Admit) 99 %    Oxygen Saturation (Exercise) 96 %    Oxygen Saturation (Exit) 96 %    Rating of Perceived Exertion (Exercise) 10    Perceived Dyspnea (Exercise) 1    Duration Continue with 30 min of aerobic exercise  without signs/symptoms of physical distress.    Intensity Other (comment)   40-80% of HRR     Progression   Progression Continue to progress workloads to maintain intensity without signs/symptoms of physical distress.      Resistance Training   Training Prescription Yes    Weight Red bands    Reps 10-15    Time 10 Minutes      Recumbant Elliptical   Level 1    Minutes 15    METs 4.5      Track   Laps 10    Minutes 15             Social History   Tobacco Use  Smoking Status Former   Packs/day: 0.50   Years: 20.00   Total pack years: 10.00   Types: Cigarettes   Quit date: 05/05/2021   Years since quitting: 0.6  Smokeless Tobacco Never    Goals Met:  Proper associated with RPD/PD & O2 Sat Exercise tolerated well Strength training completed today  Goals Unmet:  Not Applicable  Comments: Service time is from 1312 to 1436 During exercise today close to the end [name]  complained of pain in her right side.She stated that it started this morning and she had not had this pain before. She describes it as a sharpe pain "6-7". I called her PCP office [facility] and I was not able to secure her an appointment until next Wednesday after explaining her situation. I was told that they had no openings. I did encourage her to go to Urgent Care or the ED if she felt that the pain was worse or she felt she needed to to see someone for it. She voiced understanding.   Dr. [name] is Medical Director for Pulmonary Rehab at [facility].

## 2022-01-05 ENCOUNTER — Encounter (HOSPITAL_COMMUNITY)
Admission: RE | Admit: 2022-01-05 | Discharge: 2022-01-05 | Disposition: A | Discharge status: Home / Self Care | Payer: No Typology Code available for payment source | Source: Ambulatory Visit | Attending: Student | Admitting: Student

## 2022-01-05 DIAGNOSIS — J849 Interstitial pulmonary disease, unspecified: Secondary | ICD-10-CM

## 2022-01-05 NOTE — Progress Notes (Signed)
Daily Session Note  Patient Details  Name: Cassidy Tran MRN: [id number] Date of Birth: [birth year] Referring Provider:   [name] Pulmonary Rehab Walk Test from 12/16/2021 in [facility] [place]  Referring Provider [name]       Encounter Date: 01/05/2022  Check In:  Session Check In - 01/05/22 1538       Check-In   Supervising physician immediately available to respond to emergencies [facility] - Physician supervision    Physician(s) Dr. Erskine Emery    [facility]    Staff Present Rodney Langton, Cathleen Fears, MS, ACSM-CEP, Exercise Physiologist;Carlette Wilber Oliphant, RN, BSN;Samantha [place], RD, LDN;Jetta Walker BS, ACSM-CEP, Exercise Physiologist    Virtual Visit No    Medication changes reported     No    Fall or balance concerns reported    No    Tobacco Cessation No Change    Warm-up and Cool-down Performed as group-led instruction    Resistance Training Performed Yes    VAD Patient? No    PAD/SET Patient? No      Pain Assessment   Currently in Pain? No/denies    Pain Score 0-No pain    Multiple Pain Sites No             Capillary Blood Glucose: No results found for this or any previous visit (from the past 24 hour(s)).    Social History   Tobacco Use  Smoking Status Former   Packs/day: 0.50   Years: 20.00   Total pack years: 10.00   Types: Cigarettes   Quit date: 05/05/2021   Years since quitting: 0.6  Smokeless Tobacco Never    Goals Met:  Proper associated with RPD/PD & O2 Sat Independence with exercise equipment Exercise tolerated well No report of concerns or symptoms today Strength training completed today  Goals Unmet:  Not Applicable  Comments: Service time is from 1317 to 1435.    Dr. [name] is Medical Director for Pulmonary Rehab at [facility].

## 2022-01-10 ENCOUNTER — Other Ambulatory Visit: Payer: No Typology Code available for payment source | Admitting: Neurology

## 2022-01-10 ENCOUNTER — Encounter (HOSPITAL_COMMUNITY): Payer: No Typology Code available for payment source

## 2022-01-11 ENCOUNTER — Telehealth: Payer: Self-pay

## 2022-01-11 NOTE — Telephone Encounter (Signed)
-----   Message from Osgood, DO sent at 01/09/2022  7:50 AM EST ----- Regarding: FW: Ongoing diarrhea/Patient update Please see below. Can you get her a f/u appt with one of the APPs or me? Thanks.    ----- Message ----- From: [place], Samantha G, RD Sent: 01/05/2022   4:00 PM EST To: Lavena Bullion, DO Subject: Ongoing diarrhea/Patient update                Hi, I am the dietitian working with Cassidy Tran in pulmonary rehab and just wanted to make you aware that she reports continued diarrhea up to 7-15 times per day and reports new right lower quadrant pain over the last 2-3 days. She has not started the Henry and has difficulty with adherence to FODMAP diet as recommended on 10/19.  I did reach out to neurology earlier this week regarding labs from 10/6 for follow-up with B12 injections and reviewed recommended folate supplementation. At this time, she has not started folate or B12 injections and she is not consistently taking MVI. I just wanted to make you aware so you can follow-up as needed.   Thanks! Sam

## 2022-01-11 NOTE — Telephone Encounter (Signed)
Pt scheduled for appointment with Tye Savoy NP on 11/30 at 1:30 pm. Pt verbalized understanding.

## 2022-01-12 ENCOUNTER — Encounter (HOSPITAL_COMMUNITY): Payer: No Typology Code available for payment source

## 2022-01-12 ENCOUNTER — Encounter (HOSPITAL_COMMUNITY): Payer: Self-pay

## 2022-01-12 ENCOUNTER — Telehealth (HOSPITAL_COMMUNITY): Payer: Self-pay | Admitting: *Deleted

## 2022-01-12 ENCOUNTER — Inpatient Hospital Stay: Payer: No Typology Code available for payment source | Attending: Hematology & Oncology

## 2022-01-12 ENCOUNTER — Inpatient Hospital Stay: Payer: No Typology Code available for payment source | Admitting: Medical Oncology

## 2022-01-12 NOTE — Telephone Encounter (Signed)
Tried to call Cassidy Tran to check on her. She did not pick up and her mailbox is full. Will try to send a MyChart message.

## 2022-01-12 NOTE — Progress Notes (Deleted)
Hematology and Oncology Follow Up Visit  Cassidy Tran [id number] Aug 29, 1975 46 y.o. 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  The diarrhea does seem to be a little bit better.  She did have a full GI work-up again.  Her stool was all negative for C. difficile.  There was positivity for Sapovirus.  I am unsure exactly if this is clinically significant.  I know it is part of the norovirus family.  From what the gastroenterologist told Cassidy Tran, that there is nothing to do for this.   The diarrhea is not as severe.  She has some days where she has very little diarrhea.  Again there is no evidence of carcinoid.  Her last Chromogranin A level was 22.  She might be sleeping a little bit better.  We did call in some Restoril for her.  She has had no cough.  There is no fever.  She has had no bleeding.  We did culture of the wound where she had her G-tube.  This is all negative for any bacteria.  Overall, her performance status is ECOG 1.    Medications:  Current Outpatient Medications:    acetaminophen (TYLENOL) 325 MG tablet, Take 1-2 tablets (325-650 mg total) by mouth every 4 (four) hours as needed for mild pain., Disp: , Rfl:    albuterol (VENTOLIN HFA) 108 (90 Base) MCG/ACT inhaler, Inhale 2 puffs into the lungs every 6 (six) hours as needed., Disp: , Rfl:    budesonide (PULMICORT) 0.5 MG/2ML nebulizer solution, Take 1 mL (0.25 mg total) by nebulization 2 (two) times daily., Disp: 60 mL, Rfl: 0   carvedilol (COREG) 3.125 MG tablet, Take 1 tablet (3.125 mg total) by mouth 2 (two) times daily with a meal. (Patient taking differently: Take 3.125 mg by mouth 2 (two) times daily with a meal. Taking  1 tablet at night), Disp: 60 tablet, Rfl: 0   colestipol (COLESTID) 1 g tablet, Can take 1-2 pills twice a day for diarrhea, Disp: 60 tablet, Rfl: 2   diphenoxylate-atropine (LOMOTIL) 2.5-0.025 MG tablet, Take 2 tablets by mouth 4 (four) times daily. (Patient taking differently: Take 2 tablets by mouth 4 (four) times daily as needed.), Disp: 30 tablet, Rfl: 0   gabapentin (NEURONTIN) 100 MG capsule, Take 1 capsule (100 mg total) by mouth 3 (three) times daily., Disp: 270 capsule, Rfl: 3   levETIRAcetam (KEPPRA) 750 MG tablet, Take 1 tablet every night, Disp: 90 tablet, Rfl: 3   loperamide (IMODIUM A-D) 2 MG tablet, Take 2 mg by mouth 4 (four) times daily as needed for diarrhea or loose stools., Disp: , Rfl:    Magnesium 250 MG TABS, Take 250 mg by mouth at bedtime., Disp: , Rfl:    mometasone (NASONEX) 50 MCG/ACT nasal spray, Use one to two sprays in each nostril once daily, Disp: 17 g, Rfl: 5   Multiple Vitamin (MULTIVITAMIN WITH MINERALS) TABS tablet, Take 1 tablet by mouth daily., Disp: , Rfl:    mupirocin ointment (BACTROBAN) 2 %, Apply 1 Application topically 3 (three) times daily., Disp: 22 g, Rfl: 0   potassium chloride SA (KLOR-CON M) 20 MEQ tablet, Take 2 tablets (40 mEq total) by mouth 2 (two)  times daily. Take 2 tablets twice a day for 7 days and then take 1 tablet twice a day (Patient taking differently: Take 40 mEq by mouth 2 (two) times daily. Take 2 tablets twice a day for 7 days and then take 1 tablet twice a day), Disp: 120 tablet, Rfl: 2   temazepam (RESTORIL) 15 MG capsule, Take 1 capsule (15 mg total) by mouth at bedtime as needed for sleep., Disp: 30 capsule, Rfl: 0   Tiotropium Bromide-Olodaterol (STIOLTO RESPIMAT) 2.5-2.5 MCG/ACT AERS, Inhale 2 puffs into the lungs daily., Disp: 4 g, Rfl: 0   tretinoin (RETIN-A) 0.05 % cream, Apply 1 application  topically at bedtime as needed (acne)., Disp: , Rfl:    zonisamide (ZONEGRAN) 100 MG capsule, Take 1 capsule every night, Disp: 90  capsule, Rfl: 3 No current facility-administered medications for this visit.  Facility-Administered Medications Ordered in Other Visits:    lanreotide acetate (SOMATULINE DEPOT) injection 120 mg, 120 mg, Subcutaneous, Once, Ennever, Rudell Cobb, MD  Allergies:  Allergies  Allergen Reactions   Aspirin Other (See Comments)    Childhood reaction   Codeine Nausea And Vomiting    Tolerates Hydrocodone   Erythromycin Other (See Comments)    Childhood reaction, tolerate Zpak   Lactose Intolerance (Gi) Diarrhea   Nsaids Nausea And Vomiting   Penicillins Other (See Comments)    Has patient had a PCN reaction causing immediate rash, facial/tongue/throat swelling, SOB or lightheadedness with hypotension: doesn't remember childhood Reaction  Has patient had a PCN reaction causing severe rash involving mucus membranes or skin necrosis: NO Has patient had a PCN reaction that required hospitalization NO Has patient had a PCN reaction occurring within the last 10 years: NO If all of the above answers are "NO", then may proceed with Cephalosporin use.     Past Medical History, Surgical history, Social history, and Family History were reviewed and updated.  Review of Systems: Review of Systems  Constitutional:  Positive for fatigue and unexpected weight change.  HENT:  Negative.    Eyes: Negative.   Respiratory: Negative.    Cardiovascular: Negative.   Gastrointestinal:  Positive for diarrhea.  Endocrine: Negative.   Genitourinary: Negative.    Musculoskeletal: Negative.   Skin: Negative.   Neurological: Negative.   Hematological: Negative.   Psychiatric/Behavioral: Negative.      Physical Exam:  vitals were not taken for this visit.   Wt Readings from Last 3 Encounters:  01/03/22 178 lb 5.6 oz (80.9 kg)  12/16/21 180 lb 12.4 oz (82 kg)  12/15/21 181 lb 3.2 oz (82.2 kg)    Physical Exam Vitals reviewed.  HENT:     Head: Normocephalic and atraumatic.  Eyes:     Pupils: Pupils are  equal, round, and reactive to light.  Cardiovascular:     Rate and Rhythm: Normal rate and regular rhythm.     Heart sounds: Normal heart sounds.     Comments: Cardiac exam shows a regular rate and rhythm with no murmurs, rubs or bruits. Pulmonary:     Effort: Pulmonary effort is normal.     Breath sounds: Normal breath sounds.     Comments: Her lungs sound clear bilaterally.  I hear no wheezing. Abdominal:     General: Bowel sounds are normal.     Palpations: Abdomen is soft.     Comments: Abdominal exam shows soft abdomen.  She has a laparotomy scar that is healed.  This is in the midline.  She has no fluid  wave.  Bowel sounds are quite low.  There is no guarding or rebound tenderness.  There is no palpable liver or spleen tip.  Musculoskeletal:        General: No tenderness or deformity. Normal range of motion.     Cervical back: Normal range of motion.  Lymphadenopathy:     Cervical: No cervical adenopathy.  Skin:    General: Skin is warm and dry.     Findings: No erythema or rash.  Neurological:     Mental Status: She is alert and oriented to person, place, and time.  Psychiatric:        Behavior: Behavior normal.        Thought Content: Thought content normal.        Judgment: Judgment normal.      Lab Results  Component Value Date   WBC 11.3 (H) 11/24/2021   HGB 12.3 11/24/2021   HCT 38.1 11/24/2021   MCV 89.6 11/24/2021   PLT 380 11/24/2021     Chemistry      Component Value Date/Time   NA 143 11/24/2021 0838   K 3.9 11/24/2021 0838   CL 106 11/24/2021 0838   CO2 30 11/24/2021 0838   BUN 14 11/24/2021 0838   CREATININE 0.79 11/24/2021 0838   CREATININE 0.73 01/26/2020 1006      Component Value Date/Time   CALCIUM 9.8 11/24/2021 0838   ALKPHOS 132 (H) 11/24/2021 0838   AST 14 (L) 11/24/2021 0838   ALT 16 11/24/2021 0838   BILITOT 0.7 11/24/2021 0838      Impression and Plan: Ms. Garman is a very nice 46 year old white female.  She has von Willebrand  disease.  Her problem now is the history of carcinoid.  She apparently had this resected.  Initial chromogranin A level was quite high at 463.  We gave her a dose of Somatuline.  The follow-up Chromogranin A level was normal at 28.  Now, the last chromogranin A level that was sent off was 22.  I am glad that the diarrhea seems to be doing a little bit better.  Maybe she did have this virus that just has to run its course.  I do not see that we have to do any scans on her.  I think as long as the Chromogranin A level is normal, we can just watch her.  I think that we can probably get her back now close to Thanksgiving.  Hopefully, things will continue to improve.        Hughie Closs, PA-C 11/16/20239:04 AM

## 2022-01-17 ENCOUNTER — Encounter (HOSPITAL_COMMUNITY): Payer: No Typology Code available for payment source

## 2022-01-18 ENCOUNTER — Telehealth (HOSPITAL_COMMUNITY): Payer: Self-pay | Admitting: *Deleted

## 2022-01-18 NOTE — Progress Notes (Signed)
Pulmonary Individual Treatment Plan  Patient Details  Name: Cassidy Tran MRN: [id number] Date of Birth: 1975-05-18 Referring Provider:   [name] Pulmonary Rehab Walk Test from 12/16/2021 in [facility], & [place]  Referring Provider [name]       Initial Encounter Date:  Flowsheet Row Pulmonary Rehab Walk Test from 12/16/2021 in [facility] for Heart, [facility], & [place]  Date 12/16/21       Visit Diagnosis: ILD (interstitial lung disease) ([place])  Patient's Home Medications on Admission:   Current Outpatient Medications:    acetaminophen (TYLENOL) 325 MG tablet, Take 1-2 tablets (325-650 mg total) by mouth every 4 (four) hours as needed for mild pain., Disp: , Rfl:    albuterol (VENTOLIN HFA) 108 (90 Base) MCG/ACT inhaler, Inhale 2 puffs into the lungs every 6 (six) hours as needed., Disp: , Rfl:    budesonide (PULMICORT) 0.5 MG/2ML nebulizer solution, Take 1 mL (0.25 mg total) by nebulization 2 (two) times daily., Disp: 60 mL, Rfl: 0   carvedilol (COREG) 3.125 MG tablet, Take 1 tablet (3.125 mg total) by mouth 2 (two) times daily with a meal. (Patient taking differently: Take 3.125 mg by mouth 2 (two) times daily with a meal. Taking 1 tablet at night), Disp: 60 tablet, Rfl: 0   colestipol (COLESTID) 1 g tablet, Can take 1-2 pills twice a day for diarrhea, Disp: 60 tablet, Rfl: 2   diphenoxylate-atropine (LOMOTIL) 2.5-0.025 MG tablet, Take 2 tablets by mouth 4 (four) times daily. (Patient taking differently: Take 2 tablets by mouth 4 (four) times daily as needed.), Disp: 30 tablet, Rfl: 0   gabapentin (NEURONTIN) 100 MG capsule, Take 1 capsule (100 mg total) by mouth 3 (three) times daily., Disp: 270 capsule, Rfl: 3   levETIRAcetam (KEPPRA) 750 MG tablet, Take 1 tablet every night, Disp: 90 tablet, Rfl: 3   loperamide (IMODIUM A-D) 2 MG tablet, Take 2 mg by mouth 4 (four) times daily as needed for  diarrhea or loose stools., Disp: , Rfl:    Magnesium 250 MG TABS, Take 250 mg by mouth at bedtime., Disp: , Rfl:    mometasone (NASONEX) 50 MCG/ACT nasal spray, Use one to two sprays in each nostril once daily, Disp: 17 g, Rfl: 5   Multiple Vitamin (MULTIVITAMIN WITH MINERALS) TABS tablet, Take 1 tablet by mouth daily., Disp: , Rfl:    mupirocin ointment (BACTROBAN) 2 %, Apply 1 Application topically 3 (three) times daily., Disp: 22 g, Rfl: 0   potassium chloride SA (KLOR-CON M) 20 MEQ tablet, Take 2 tablets (40 mEq total) by mouth 2 (two) times daily. Take 2 tablets twice a day for 7 days and then take 1 tablet twice a day (Patient taking differently: Take 40 mEq by mouth 2 (two) times daily. Take 2 tablets twice a day for 7 days and then take 1 tablet twice a day), Disp: 120 tablet, Rfl: 2   temazepam (RESTORIL) 15 MG capsule, Take 1 capsule (15 mg total) by mouth at bedtime as needed for sleep., Disp: 30 capsule, Rfl: 0   Tiotropium Bromide-Olodaterol (STIOLTO RESPIMAT) 2.5-2.5 MCG/ACT AERS, Inhale 2 puffs into the lungs daily., Disp: 4 g, Rfl: 0   tretinoin (RETIN-A) 0.05 % cream, Apply 1 application  topically at bedtime as needed (acne)., Disp: , Rfl:    zonisamide (ZONEGRAN) 100 MG capsule, Take 1 capsule every night, Disp: 90 capsule, Rfl: 3 No current facility-administered medications for this encounter.  Facility-Administered Medications Ordered in Other Encounters:    lanreotide acetate (SOMATULINE DEPOT) injection 120 mg, 120 mg, Subcutaneous, Once, Volanda Napoleon, MD  Past Medical History: Past Medical History:  Diagnosis Date   Acid reflux    Allergy    Anxiety    Arthritis    lower back   ASCUS (atypical squamous cells of undetermined significance) on Pap smear 03/2011   NEG HR HPV   Asthma    Cancer ([place])    skin cancer- age 18ish   Cervical dysplasia, mild 08/2010   LGSIL colposcopy biopsy showing koilocytotic atypia   Clotting disorder ([place])    Depression     Endometriosis    Headache(784.0)    High risk HPV infection 12/2011   Pap normal   Hypertension    Pneumonia    2016ish   Seizures ([place])    seizure disorder   Smoker    Von Willebrand disease ([place])     Tobacco Use: Social History   Tobacco Use  Smoking Status Former   Packs/day: 0.50   Years: 20.00   Total pack years: 10.00   Types: Cigarettes   Quit date: 05/05/2021   Years since quitting: 0.7  Smokeless Tobacco Never    Labs: Review Flowsheet  More data exists      Latest Ref Rng & Units 05/27/2021 05/28/2021 05/29/2021 05/30/2021 05/31/2021  Labs for ITP Cardiac and Pulmonary Rehab  PH, Arterial 7.35 - 7.45 7.365  7.337  7.420  7.430  - 7.476   PCO2 arterial 32 - 48 mmHg 71.3  75.2  70.2  74.3  - 55.7   Bicarbonate 20.0 - 28.0 mmol/L 40.8  40.1  45.5  49.0  - 41.2   TCO2 22 - 32 mmol/L 43  42  48  >50  50  43   O2 Saturation % 92  92  91  89  - 98     Capillary Blood Glucose: Lab Results  Component Value Date   GLUCAP 125 (H) 06/15/2021   GLUCAP 135 (H) 06/15/2021   GLUCAP 136 (H) 06/15/2021   GLUCAP 121 (H) 06/14/2021   GLUCAP 107 (H) 06/14/2021     Pulmonary Assessment Scores:  Pulmonary Assessment Scores     Row Name 12/16/21 0935         ADL UCSD   ADL Phase Entry     SOB Score total 6       CAT Score   CAT Score 16       mMRC Score   mMRC Score 0             UCSD: Self-administered rating of dyspnea associated with activities of daily living (ADLs) 6-point scale (0 = "not at all" to 5 = "maximal or unable to do because of breathlessness")  Scoring Scores range from 0 to 120.  Minimally important difference is 5 units  CAT: CAT can identify the health impairment of COPD patients and is better correlated with disease progression.  CAT has a scoring range of zero to 40. The CAT score is classified into four groups of low (less than 10), medium (10 - 20), high (21-30) and very high (31-40) based on the impact level of disease on health status.  A CAT score over 10 suggests significant symptoms.  A worsening CAT score could be explained by an exacerbation, poor medication adherence, poor inhaler technique, or progression of COPD or comorbid conditions.  CAT MCID is 2 points  mMRC: mMRC (Modified  Medical Research Council) Dyspnea Scale is used to assess the degree of baseline functional disability in patients of respiratory disease due to dyspnea. No minimal important difference is established. A decrease in score of 1 point or greater is considered a positive change.   Pulmonary Function Assessment:  Pulmonary Function Assessment - 12/16/21 0907       Breath   Bilateral Breath Sounds Wheezes    Shortness of Breath Yes             Exercise Target Goals: Exercise Program Goal: Individual exercise prescription set using results from initial 6 min walk test and THRR while considering  patient's activity barriers and safety.   Exercise Prescription Goal: Initial exercise prescription builds to 30-45 minutes a day of aerobic activity, 2-3 days per week.  Home exercise guidelines will be given to patient during program as part of exercise prescription that the participant will acknowledge.  Activity Barriers & Risk Stratification:  Activity Barriers & Cardiac Risk Stratification - 12/16/21 0936       Activity Barriers & Cardiac Risk Stratification   Activity Barriers Deconditioning;Muscular Weakness;Shortness of Breath;Joint Problems             6 Minute Walk:  6 Minute Walk     Row Name 12/16/21 1336         6 Minute Walk   Phase Initial     Distance 1532 feet     Walk Time 6 minutes     # of Rest Breaks 0     MPH 2.9     METS 4.69     RPE 6     Perceived Dyspnea  0     VO2 Peak 16.42     Symptoms No     Resting HR 97 bpm     Resting BP 140/82     Resting Oxygen Saturation  98 %     Exercise Oxygen Saturation  during 6 min walk 95 %     Max Ex. HR 119 bpm     Max Ex. BP 160/90     2 Minute Post BP  150/88       Interval HR   1 Minute HR 107     2 Minute HR 113     3 Minute HR 117     4 Minute HR 116     5 Minute HR 117     6 Minute HR 119     2 Minute Post HR 97     Interval Heart Rate? Yes       Interval Oxygen   Interval Oxygen? Yes     Baseline Oxygen Saturation % 98 %     1 Minute Oxygen Saturation % 96 %     1 Minute Liters of Oxygen 0 L     2 Minute Oxygen Saturation % 96 %     2 Minute Liters of Oxygen 0 L     3 Minute Oxygen Saturation % 95 %     3 Minute Liters of Oxygen 0 L     4 Minute Oxygen Saturation % 95 %     4 Minute Liters of Oxygen 0 L     5 Minute Oxygen Saturation % 96 %     5 Minute Liters of Oxygen 0 L     6 Minute Oxygen Saturation % 95 %     6 Minute Liters of Oxygen 0 L     2 Minute Post Oxygen Saturation %  97 %     2 Minute Post Liters of Oxygen 0 L              Oxygen Initial Assessment:  Oxygen Initial Assessment - 12/16/21 0934       Home Oxygen   Home Oxygen Device None    Sleep Oxygen Prescription None    Home Exercise Oxygen Prescription None    Home Resting Oxygen Prescription None      Initial 6 min Walk   Oxygen Used None      Program Oxygen Prescription   Program Oxygen Prescription None      Intervention   Short Term Goals To learn and exhibit compliance with exercise, home and travel O2 prescription;To learn and understand importance of monitoring SPO2 with pulse oximeter and demonstrate accurate use of the pulse oximeter.;To learn and understand importance of maintaining oxygen saturations>88%;To learn and demonstrate proper pursed lip breathing techniques or other breathing techniques. ;To learn and demonstrate proper use of respiratory medications    Long  Term Goals Exhibits compliance with exercise, home  and travel O2 prescription;Verbalizes importance of monitoring SPO2 with pulse oximeter and return demonstration;Maintenance of O2 saturations>88%;Exhibits proper breathing techniques, such as pursed lip  breathing or other method taught during program session;Compliance with respiratory medication;Demonstrates proper use of MDI's             Oxygen Re-Evaluation:  Oxygen Re-Evaluation     Row Name 12/19/21 0821 01/11/22 1427           Program Oxygen Prescription   Program Oxygen Prescription None None        Home Oxygen   Home Oxygen Device None None      Sleep Oxygen Prescription None None      Home Exercise Oxygen Prescription None None      Home Resting Oxygen Prescription None None        Goals/Expected Outcomes   Short Term Goals To learn and exhibit compliance with exercise, home and travel O2 prescription;To learn and understand importance of monitoring SPO2 with pulse oximeter and demonstrate accurate use of the pulse oximeter.;To learn and understand importance of maintaining oxygen saturations>88%;To learn and demonstrate proper pursed lip breathing techniques or other breathing techniques. ;To learn and demonstrate proper use of respiratory medications To learn and exhibit compliance with exercise, home and travel O2 prescription;To learn and understand importance of monitoring SPO2 with pulse oximeter and demonstrate accurate use of the pulse oximeter.;To learn and understand importance of maintaining oxygen saturations>88%;To learn and demonstrate proper pursed lip breathing techniques or other breathing techniques. ;To learn and demonstrate proper use of respiratory medications      Long  Term Goals Exhibits compliance with exercise, home  and travel O2 prescription;Verbalizes importance of monitoring SPO2 with pulse oximeter and return demonstration;Maintenance of O2 saturations>88%;Exhibits proper breathing techniques, such as pursed lip breathing or other method taught during program session;Compliance with respiratory medication;Demonstrates proper use of MDI's Exhibits compliance with exercise, home  and travel O2 prescription;Verbalizes importance of monitoring SPO2  with pulse oximeter and return demonstration;Maintenance of O2 saturations>88%;Exhibits proper breathing techniques, such as pursed lip breathing or other method taught during program session;Compliance with respiratory medication;Demonstrates proper use of MDI's      Goals/Expected Outcomes Compliance and understanding of oxygen saturation monitoring and breathing techniques to decrease shortness of breath. Compliance and understanding of oxygen saturation monitoring and breathing techniques to decrease shortness of breath.  Oxygen Discharge (Final Oxygen Re-Evaluation):  Oxygen Re-Evaluation - 01/11/22 1427       Program Oxygen Prescription   Program Oxygen Prescription None      Home Oxygen   Home Oxygen Device None    Sleep Oxygen Prescription None    Home Exercise Oxygen Prescription None    Home Resting Oxygen Prescription None      Goals/Expected Outcomes   Short Term Goals To learn and exhibit compliance with exercise, home and travel O2 prescription;To learn and understand importance of monitoring SPO2 with pulse oximeter and demonstrate accurate use of the pulse oximeter.;To learn and understand importance of maintaining oxygen saturations>88%;To learn and demonstrate proper pursed lip breathing techniques or other breathing techniques. ;To learn and demonstrate proper use of respiratory medications    Long  Term Goals Exhibits compliance with exercise, home  and travel O2 prescription;Verbalizes importance of monitoring SPO2 with pulse oximeter and return demonstration;Maintenance of O2 saturations>88%;Exhibits proper breathing techniques, such as pursed lip breathing or other method taught during program session;Compliance with respiratory medication;Demonstrates proper use of MDI's    Goals/Expected Outcomes Compliance and understanding of oxygen saturation monitoring and breathing techniques to decrease shortness of breath.             Initial Exercise  Prescription:  Initial Exercise Prescription - 12/16/21 1300       Date of Initial Exercise RX and Referring Provider   Date 12/16/21    Referring Provider [name]    Expected Discharge Date 02/23/22      Recumbant Bike   Level 2    Minutes 15    METs 3      NuStep   Level 2    SPM 70    Minutes 15    METs 2      Prescription Details   Frequency (times per week) 2    Duration Progress to 30 minutes of continuous aerobic without signs/symptoms of physical distress      Intensity   THRR 40-80% of Max Heartrate 69-138    Ratings of Perceived Exertion 11-13    Perceived Dyspnea 0-4      Progression   Progression Continue progressive overload as per policy without signs/symptoms or physical distress.      Resistance Training   Training Prescription Yes    Weight red bands    Reps 10-15             Perform Capillary Blood Glucose checks as needed.  Exercise Prescription Changes:   Exercise Prescription Changes     Row Name 01/03/22 1500 01/05/22 1640           Response to Exercise   Blood Pressure (Admit) 140/90 130/80      Blood Pressure (Exercise) 156/80 --      Blood Pressure (Exit) 138/84 124/72      Heart Rate (Admit) 99 bpm 95 bpm      Heart Rate (Exercise) 130 bpm 94 bpm      Heart Rate (Exit) 103 bpm 98 bpm      Oxygen Saturation (Admit) 99 % 97 %      Oxygen Saturation (Exercise) 96 % 94 %      Oxygen Saturation (Exit) 96 % 98 %      Rating of Perceived Exertion (Exercise) 10 9      Perceived Dyspnea (Exercise) 1 1      Duration Continue with 30 min of aerobic exercise without signs/symptoms of physical distress. Continue with 30 min  of aerobic exercise without signs/symptoms of physical distress.      Intensity Other (comment)  40-80% of HRR THRR unchanged        Progression   Progression Continue to progress workloads to maintain intensity without signs/symptoms of physical distress. Continue to progress workloads to maintain intensity without  signs/symptoms of physical distress.        Resistance Training   Training Prescription Yes Yes      Weight Red bands Red Bands      Reps 10-15 10-15      Time 10 Minutes 10 Minutes        Recumbant Elliptical   Level 1 2      Minutes 15 15      METs 4.5 4.3        Track   Laps 10 13      Minutes 15 15      METs -- 3               Exercise Comments:   Exercise Comments     Row Name 12/22/21 1536           Exercise Comments Pt completed first day of exercise. She exercised on the recumbent bike and Nustep for 15 min. She averaged 2.3 METs at level 2 on the recumbent bike and 2.2 METs at level 6 on the Nustep. She performed the warmup and cooldown standing without limitations. We reviewed METs and how to increase METs.                Exercise Goals and Review:   Exercise Goals     Row Name 12/16/21 6389 12/19/21 0820 01/11/22 1416         Exercise Goals   Increase Physical Activity Yes Yes Yes     Intervention Provide advice, education, support and counseling about physical activity/exercise needs.;Develop an individualized exercise prescription for aerobic and resistive training based on initial evaluation findings, risk stratification, comorbidities and participant's personal goals. Provide advice, education, support and counseling about physical activity/exercise needs.;Develop an individualized exercise prescription for aerobic and resistive training based on initial evaluation findings, risk stratification, comorbidities and participant's personal goals. Provide advice, education, support and counseling about physical activity/exercise needs.;Develop an individualized exercise prescription for aerobic and resistive training based on initial evaluation findings, risk stratification, comorbidities and participant's personal goals.     Expected Outcomes Short Term: Attend rehab on a regular basis to increase amount of physical activity.;Long Term: Add in home  exercise to make exercise part of routine and to increase amount of physical activity.;Long Term: Exercising regularly at least 3-5 days a week. Short Term: Attend rehab on a regular basis to increase amount of physical activity.;Long Term: Add in home exercise to make exercise part of routine and to increase amount of physical activity.;Long Term: Exercising regularly at least 3-5 days a week. Short Term: Attend rehab on a regular basis to increase amount of physical activity.;Long Term: Add in home exercise to make exercise part of routine and to increase amount of physical activity.;Long Term: Exercising regularly at least 3-5 days a week.     Increase Strength and Stamina Yes Yes Yes     Intervention Provide advice, education, support and counseling about physical activity/exercise needs.;Develop an individualized exercise prescription for aerobic and resistive training based on initial evaluation findings, risk stratification, comorbidities and participant's personal goals. Provide advice, education, support and counseling about physical activity/exercise needs.;Develop an individualized exercise prescription for aerobic and resistive  training based on initial evaluation findings, risk stratification, comorbidities and participant's personal goals. Provide advice, education, support and counseling about physical activity/exercise needs.;Develop an individualized exercise prescription for aerobic and resistive training based on initial evaluation findings, risk stratification, comorbidities and participant's personal goals.     Expected Outcomes Short Term: Increase workloads from initial exercise prescription for resistance, speed, and METs.;Short Term: Perform resistance training exercises routinely during rehab and add in resistance training at home;Long Term: Improve cardiorespiratory fitness, muscular endurance and strength as measured by increased METs and functional capacity (6MWT) Short Term: Increase  workloads from initial exercise prescription for resistance, speed, and METs.;Short Term: Perform resistance training exercises routinely during rehab and add in resistance training at home;Long Term: Improve cardiorespiratory fitness, muscular endurance and strength as measured by increased METs and functional capacity (6MWT) Short Term: Increase workloads from initial exercise prescription for resistance, speed, and METs.;Short Term: Perform resistance training exercises routinely during rehab and add in resistance training at home;Long Term: Improve cardiorespiratory fitness, muscular endurance and strength as measured by increased METs and functional capacity (6MWT)     Able to understand and use rate of perceived exertion (RPE) scale Yes Yes Yes     Intervention Provide education and explanation on how to use RPE scale Provide education and explanation on how to use RPE scale Provide education and explanation on how to use RPE scale     Expected Outcomes Short Term: Able to use RPE daily in rehab to express subjective intensity level;Long Term:  Able to use RPE to guide intensity level when exercising independently Short Term: Able to use RPE daily in rehab to express subjective intensity level;Long Term:  Able to use RPE to guide intensity level when exercising independently Short Term: Able to use RPE daily in rehab to express subjective intensity level;Long Term:  Able to use RPE to guide intensity level when exercising independently     Able to understand and use Dyspnea scale Yes Yes Yes     Intervention Provide education and explanation on how to use Dyspnea scale Provide education and explanation on how to use Dyspnea scale Provide education and explanation on how to use Dyspnea scale     Expected Outcomes Short Term: Able to use Dyspnea scale daily in rehab to express subjective sense of shortness of breath during exertion;Long Term: Able to use Dyspnea scale to guide intensity level when  exercising independently Short Term: Able to use Dyspnea scale daily in rehab to express subjective sense of shortness of breath during exertion;Long Term: Able to use Dyspnea scale to guide intensity level when exercising independently Short Term: Able to use Dyspnea scale daily in rehab to express subjective sense of shortness of breath during exertion;Long Term: Able to use Dyspnea scale to guide intensity level when exercising independently     Knowledge and understanding of Target Heart Rate Range (THRR) Yes Yes Yes     Intervention Provide education and explanation of THRR including how the numbers were predicted and where they are located for reference Provide education and explanation of THRR including how the numbers were predicted and where they are located for reference Provide education and explanation of THRR including how the numbers were predicted and where they are located for reference     Expected Outcomes Short Term: Able to state/look up THRR;Long Term: Able to use THRR to govern intensity when exercising independently;Short Term: Able to use daily as guideline for intensity in rehab Short Term: Able to state/look up  THRR;Long Term: Able to use THRR to govern intensity when exercising independently;Short Term: Able to use daily as guideline for intensity in rehab Short Term: Able to state/look up THRR;Long Term: Able to use THRR to govern intensity when exercising independently;Short Term: Able to use daily as guideline for intensity in rehab     Understanding of Exercise Prescription Yes Yes Yes     Intervention Provide education, explanation, and written materials on patient's individual exercise prescription Provide education, explanation, and written materials on patient's individual exercise prescription Provide education, explanation, and written materials on patient's individual exercise prescription     Expected Outcomes Short Term: Able to explain program exercise prescription;Long  Term: Able to explain home exercise prescription to exercise independently Short Term: Able to explain program exercise prescription;Long Term: Able to explain home exercise prescription to exercise independently Short Term: Able to explain program exercise prescription;Long Term: Able to explain home exercise prescription to exercise independently              Exercise Goals Re-Evaluation :  Exercise Goals Re-Evaluation     Row Name 12/19/21 0821 01/11/22 1416           Exercise Goal Re-Evaluation   Exercise Goals Review Increase Physical Activity;Increase Strength and Stamina;Able to understand and use rate of perceived exertion (RPE) scale;Able to understand and use Dyspnea scale;Knowledge and understanding of Target Heart Rate Range (THRR);Understanding of Exercise Prescription Increase Physical Activity;Increase Strength and Stamina;Able to understand and use rate of perceived exertion (RPE) scale;Able to understand and use Dyspnea scale;Knowledge and understanding of Target Heart Rate Range (THRR);Understanding of Exercise Prescription      Comments Braxton is scheduled to begin exercise this week. Dezire has completed 4 exercise sessions. She missed the last session. She is currently walking the track for 15 min, 1.87 METs. Then using the recumbent elliptical for 15 min, level 2, METs, 4.3. Pt tolerating well. She performs warm up and cool down independently. Will continue to progress.      Expected Outcomes Through exercise at rehab and home, the patient will decrease shortness of breath with daily activities and feel confident in carrying out an exercise regimen at home. Through exercise at rehab and home, the patient will decrease shortness of breath with daily activities and feel confident in carrying out an exercise regimen at home.               Discharge Exercise Prescription (Final Exercise Prescription Changes):  Exercise Prescription Changes - 01/05/22 1640        Response to Exercise   Blood Pressure (Admit) 130/80    Blood Pressure (Exit) 124/72    Heart Rate (Admit) 95 bpm    Heart Rate (Exercise) 94 bpm    Heart Rate (Exit) 98 bpm    Oxygen Saturation (Admit) 97 %    Oxygen Saturation (Exercise) 94 %    Oxygen Saturation (Exit) 98 %    Rating of Perceived Exertion (Exercise) 9    Perceived Dyspnea (Exercise) 1    Duration Continue with 30 min of aerobic exercise without signs/symptoms of physical distress.    Intensity THRR unchanged      Progression   Progression Continue to progress workloads to maintain intensity without signs/symptoms of physical distress.      Resistance Training   Training Prescription Yes    Weight Red Bands    Reps 10-15    Time 10 Minutes      Recumbant Elliptical   Level 2  Minutes 15    METs 4.3      Track   Laps 13    Minutes 15    METs 3             Nutrition:  Target Goals: Understanding of nutrition guidelines, daily intake of sodium <1571m, cholesterol <2033m calories 30% from fat and 7% or less from saturated fats, daily to have 5 or more servings of fruits and vegetables.  Biometrics:  Pre Biometrics - 12/16/21 0854       Pre Biometrics   Grip Strength 10 kg              Nutrition Therapy Plan and Nutrition Goals:  Nutrition Therapy & Goals - 01/05/22 1604       Nutrition Therapy   Diet Heart Healthy diet      Personal Nutrition Goals   Nutrition Goal Patient to use the plate method as guide for meal planning to include lean protein/plant protein, fruit, vegetables, whole grains, and low fat dairy as part of heart healthy diet    Personal Goal #2 Patient to identify food sources of saturated fat, trans fat, sodium, and refined carbohydrates    Comments StMysteryemains pre-contemplative of nutrition changes. She reports difficulty with adherance to FODMAP diet and continues coffee drinks, regular soda, carbonation, and a variety of high FODMAP foods. She has not  started FiWal-MartShe has not started B12 injections, Folate, and is inconsistent with taking MVI. She reports that she has quit smoking and does not drink alcohol. Contacted neurology for follow-up on B12 and folate labs. Contacted gastroenterology regarding difficulty with compliance of fiber supplement recommendations and FODMAP diet. Patient continues to report diarrhea up to 7-15x per day. Reviewed supplement recommendations and FODMAP diet.      Intervention Plan   Intervention Prescribe, educate and counsel regarding individualized specific dietary modifications aiming towards targeted core components such as weight, hypertension, lipid management, diabetes, heart failure and other comorbidities.;Nutrition handout(s) given to patient.    Expected Outcomes Short Term Goal: Understand basic principles of dietary content, such as calories, fat, sodium, cholesterol and nutrients.;Long Term Goal: Adherence to prescribed nutrition plan.             Nutrition Assessments:  Nutrition Assessments - 12/29/21 0952       Rate Your Plate Scores   Pre Score 64            MEDIFICTS Score Key: ?70 Need to make dietary changes  40-70 Heart Healthy Diet ? 40 Therapeutic Level Cholesterol Diet  Flowsheet Row PULMONARY REHAB OTHER RESPIRATORY [facility]  Picture Your Plate Total Score on Admission 64      Picture Your Plate Scores: <4<67nhealthy dietary pattern with much room for improvement. 41-50 Dietary pattern unlikely to meet recommendations for good health and room for improvement. 51-60 More healthful dietary pattern, with some room for improvement.  >60 Healthy dietary pattern, although there may be some specific behaviors that could be improved.    Nutrition Goals Re-Evaluation:  Nutrition Goals Re-Evaluation     RoGolcondaame 12/29/21 1534 01/05/22 1604           Goals   Current Weight 180 lb 1.9  oz (81.7 kg) 179 lb 0.2 oz (81.2 kg)      Comment A1c 6.1, cholesterol 291, LDL 201, triglycerides 250, B1 low normal, folate 4.8, B12 182 No new labs; most recent labs  show  A1c 6.1, cholesterol 291, LDL 201, triglycerides 250, B1 low normal, folate 4.8, B12 182      Expected Outcome Raymonda was hospitalized earlier this year and lost ~40#; she has regained about 10# since that time. She reports motivation to maintain that weight loss. In addition, she continues to struggle with ongoing GI issues including loose stools/diarhea up to 7x per day. She is followed by GI and they recommended that she start FODMAP diet; she reports starting this 2-3 weeks ago but actually continues many high fodmap foods such as (Coke, wheat products, etc). We discussed keeping a journal of food intake and subsequent GI symptoms. Gave additional education in starting the FODMAP diet and encouraged elimination of common GI irritants such as caffeine, carbonation, etc. She continue Creon prescribed by GI. B12 and Folate are low; encouraged follow-up with PCP and starting prenatal MVI. She has history of elevated lipid panel. Patient with a strong odor of tobacco today. Harmonee remains pre-contemplative of nutrition changes. She reports difficulty with adherance to FODMAP diet and continues coffee drinks, regular soda, carbonation, and a variety of high FODMAP foods. She has not started [employer]. She has not started B12 injections, Folate, and is inconsistent with taking MVI. She reports that she has quit smoking and does not drink alcohol. Contacted neurology for follow-up on B12 and folate labs. Contacted gastroenterology regarding difficulty with compliance of fiber supplement recommendations and FODMAP diet. Patient continues to report diarrhea up to 7-15x per day. Reviewed supplement recommendations and FODMAP diet.               Nutrition Goals Discharge (Final Nutrition Goals Re-Evaluation):  Nutrition Goals  Re-Evaluation - 01/05/22 1604       Goals   Current Weight 179 lb 0.2 oz (81.2 kg)    Comment No new labs; most recent labs show  A1c 6.1, cholesterol 291, LDL 201, triglycerides 250, B1 low normal, folate 4.8, B12 182    Expected Outcome Jamari remains pre-contemplative of nutrition changes. She reports difficulty with adherance to FODMAP diet and continues coffee drinks, regular soda, carbonation, and a variety of high FODMAP foods. She has not started [employer]. She has not started B12 injections, Folate, and is inconsistent with taking MVI. She reports that she has quit smoking and does not drink alcohol. Contacted neurology for follow-up on B12 and folate labs. Contacted gastroenterology regarding difficulty with compliance of fiber supplement recommendations and FODMAP diet. Patient continues to report diarrhea up to 7-15x per day. Reviewed supplement recommendations and FODMAP diet.             Psychosocial: Target Goals: Acknowledge presence or absence of significant depression and/or stress, maximize coping skills, provide positive support system. Participant is able to verbalize types and ability to use techniques and skills needed for reducing stress and depression.  Initial Review & Psychosocial Screening:  Initial Psych Review & Screening - 12/16/21 0905       Initial Review   Current issues with History of Depression;Current Anxiety/Panic;Current Psychotropic Meds    Comments Is on medication for anxiety and depression      Family Dynamics   Good Support System? Yes    Comments spouse and parents      Barriers   Psychosocial barriers to participate in program There are no identifiable barriers or psychosocial needs.      Screening Interventions   Interventions Encouraged to exercise    Expected Outcomes --  Quality of Life Scores:  Scores of 19 and below usually indicate a poorer quality of life in these areas.  A difference of  2-3 points is a  clinically meaningful difference.  A difference of 2-3 points in the total score of the Quality of Life Index has been associated with significant improvement in overall quality of life, self-image, physical symptoms, and general health in studies assessing change in quality of life.  PHQ-9: Review Flowsheet       12/16/2021 11/01/2016 04/30/2015  Depression screen PHQ 2/9  Decreased Interest 0 0 0  Down, Depressed, Hopeless 1 1 0  PHQ - 2 Score 1 1 0  Altered sleeping 0 - -  Tired, decreased energy 0 - -  Change in appetite 1 - -  Feeling bad or failure about yourself  0 - -  Trouble concentrating 0 - -  Moving slowly or fidgety/restless 0 - -  Suicidal thoughts 0 - -  PHQ-9 Score 2 - -  Difficult doing work/chores Not difficult at all - -   Interpretation of Total Score  Total Score Depression Severity:  1-4 = Minimal depression, 5-9 = Mild depression, 10-14 = Moderate depression, 15-19 = Moderately severe depression, 20-27 = Severe depression   Psychosocial Evaluation and Intervention:  Psychosocial Evaluation - 12/16/21 0911       Psychosocial Evaluation & Interventions   Interventions Encouraged to exercise with the program and follow exercise prescription    Comments Kameran denies any psychosocial barriers to exercising in rehab. She feels that her anxiety and depression are managed well with medication.    Expected Outcomes For Skyra to participate in rehab free of psychosocial concerns    Continue Psychosocial Services  No Follow up required             Psychosocial Re-Evaluation:  Psychosocial Re-Evaluation     Campbell Name 12/19/21 7341 01/11/22 1328           Psychosocial Re-Evaluation   Current issues with Current Anxiety/Panic;Current Psychotropic Meds;History of Depression Current Anxiety/Panic;History of Depression;None Identified      Comments Rhya does not feel she had any psychosocial concerns to participating in rehab. She is on medication  Teighlor has been able to participate in the PR program without any psychosocial barriers. Her depression and anxiety have not been an issue with her attendance.      Expected Outcomes For Dilcia to participate in rehab free of psychosocial barriers. For her to continue to attend the program without any psychosocial isssues or concerns.      Interventions Encouraged to attend Pulmonary Rehabilitation for the exercise Encouraged to attend Pulmonary Rehabilitation for the exercise      Continue Psychosocial Services  No Follow up required No Follow up required               Psychosocial Discharge (Final Psychosocial Re-Evaluation):  Psychosocial Re-Evaluation - 01/11/22 1328       Psychosocial Re-Evaluation   Current issues with Current Anxiety/Panic;History of Depression;None Identified    Comments Alayziah has been able to participate in the PR program without any psychosocial barriers. Her depression and anxiety have not been an issue with her attendance.    Expected Outcomes For her to continue to attend the program without any psychosocial isssues or concerns.    Interventions Encouraged to attend Pulmonary Rehabilitation for the exercise    Continue Psychosocial Services  No Follow up required  Education: Education Goals: Education classes will be provided on a weekly basis, covering required topics. Participant will state understanding/return demonstration of topics presented.  Learning Barriers/Preferences:  Learning Barriers/Preferences - 12/16/21 0916       Learning Barriers/Preferences   Learning Barriers None    Learning Preferences None             Education Topics: Introduction to Pulmonary Rehab Group instruction provided by PowerPoint, verbal discussion, and written material to support subject matter. Instructor reviews what Pulmonary Rehab is, the purpose of the program, and how patients are referred.     Know Your Numbers Group  instruction that is supported by a PowerPoint presentation. Instructor discusses importance of knowing and understanding resting, exercise, and post-exercise oxygen saturation, heart rate, and blood pressure. Oxygen saturation, heart rate, blood pressure, rating of perceived exertion, and dyspnea are reviewed along with a normal range for these values.    Exercise for the Pulmonary Patient Group instruction that is supported by a PowerPoint presentation. Instructor discusses benefits of exercise, core components of exercise, frequency, duration, and intensity of an exercise routine, importance of utilizing pulse oximetry during exercise, safety while exercising, and options of places to exercise outside of rehab.       MET Level  Group instruction provided by PowerPoint, verbal discussion, and written material to support subject matter. Instructor reviews what METs are and how to increase METs.    Pulmonary Medications Verbally interactive group education provided by instructor with focus on inhaled medications and proper administration.   Anatomy and Physiology of the Respiratory System Group instruction provided by PowerPoint, verbal discussion, and written material to support subject matter. Instructor reviews respiratory cycle and anatomical components of the respiratory system and their functions. Instructor also reviews differences in obstructive and restrictive respiratory diseases with examples of each.    Oxygen Safety Group instruction provided by PowerPoint, verbal discussion, and written material to support subject matter. There is an overview of "What is Oxygen" and "Why do we need it".  Instructor also reviews how to create a safe environment for oxygen use, the importance of using oxygen as prescribed, and the risks of noncompliance. There is a brief discussion on traveling with oxygen and resources the patient may utilize. Flowsheet Row PULMONARY REHAB OTHER RESPIRATORY from  01/05/2022 in [facility]  Date 01/05/22  Educator EP  Instruction Review Code 1- Verbalizes Understanding       Oxygen Use Group instruction provided by PowerPoint, verbal discussion, and written material to discuss how supplemental oxygen is prescribed and different types of oxygen supply systems. Resources for more information are provided.    Breathing Techniques Group instruction that is supported by demonstration and informational handouts. Instructor discusses the benefits of pursed lip and diaphragmatic breathing and detailed demonstration on how to perform both.     Risk Factor Reduction Group instruction that is supported by a PowerPoint presentation. Instructor discusses the definition of a risk factor, different risk factors for pulmonary disease, and how the heart and lungs work together.   MD Day A group question and answer session with a medical doctor that allows participants to ask questions that relate to their pulmonary disease state.   Nutrition for the Pulmonary Patient Group instruction provided by PowerPoint slides, verbal discussion, and written materials to support subject matter. The instructor gives an explanation and review of healthy diet recommendations, which includes a discussion on weight management, recommendations for fruit and  vegetable consumption, as well as protein, fluid, caffeine, fiber, sodium, sugar, and alcohol. Tips for eating when patients are short of breath are discussed.    Other Education Group or individual verbal, written, or video instructions that support the educational goals of the pulmonary rehab program.    Knowledge Questionnaire Score:  Knowledge Questionnaire Score - 12/16/21 0924       Knowledge Questionnaire Score   Pre Score 14/18             Core Components/Risk Factors/Patient Goals at Admission:  Personal Goals and Risk Factors at Admission -  12/16/21 0924       Core Components/Risk Factors/Patient Goals on Admission    Weight Management Weight Loss   Low FODMAP   Improve shortness of breath with ADL's Yes    Intervention Provide education, individualized exercise plan and daily activity instruction to help decrease symptoms of SOB with activities of daily living.    Expected Outcomes Short Term: Improve cardiorespiratory fitness to achieve a reduction of symptoms when performing ADLs;Long Term: Be able to perform more ADLs without symptoms or delay the onset of symptoms             Core Components/Risk Factors/Patient Goals Review:   Goals and Risk Factor Review     Row Name 12/19/21 0832 01/11/22 1333           Core Components/Risk Factors/Patient Goals Review   Personal Goals Review Weight Management/Obesity;Improve shortness of breath with ADL's;Develop more efficient breathing techniques such as purse lipped breathing and diaphragmatic breathing and practicing self-pacing with activity. Weight Management/Obesity;Improve shortness of breath with ADL's;Develop more efficient breathing techniques such as purse lipped breathing and diaphragmatic breathing and practicing self-pacing with activity.      Review Lizvette is scheduled to begin exercise this week. Susette has attended 4 session thus far.Her weights have been stable since starting the program without loss or gain. She has been trying several diffenrt types of equipment. Currently we have her walking the track and using the recumbent elliptical. She reports none to mild SOB with exercise and oxygen saturations 94-98% on room air. She has had some difficulty with abdominal pains and the dietician has been working with her to get her back in with GI MD and giving her advise on ways to improve this with her diet.      Expected Outcomes See admission goals See admission goals.               Core Components/Risk Factors/Patient Goals at Discharge (Final Review):    Goals and Risk Factor Review - 01/11/22 1333       Core Components/Risk Factors/Patient Goals Review   Personal Goals Review Weight Management/Obesity;Improve shortness of breath with ADL's;Develop more efficient breathing techniques such as purse lipped breathing and diaphragmatic breathing and practicing self-pacing with activity.    Review Ranesha has attended 4 session thus far.Her weights have been stable since starting the program without loss or gain. She has been trying several diffenrt types of equipment. Currently we have her walking the track and using the recumbent elliptical. She reports none to mild SOB with exercise and oxygen saturations 94-98% on room air. She has had some difficulty with abdominal pains and the dietician has been working with her to get her back in with GI MD and giving her advise on ways to improve this with her diet.    Expected Outcomes See admission goals.  ITP Comments:   Comments: Pt is making expected progress toward Pulmonary Rehab goals after completing 4 sessions. Recommend continued exercise, life style modification, education, and utilization of breathing techniques to increase stamina and strength, while also decreasing shortness of breath with exertion.  Dr. [name] is Medical Director for Pulmonary Rehab at [facility].

## 2022-01-18 NOTE — Telephone Encounter (Signed)
Attempted to call Cassidy Tran. I was not able to reach her or leave a message due to her mailbox being full. the last day she attended the PR program was 01/02/22.

## 2022-01-19 ENCOUNTER — Other Ambulatory Visit: Payer: Self-pay | Admitting: Hematology & Oncology

## 2022-01-24 ENCOUNTER — Encounter (HOSPITAL_COMMUNITY): Admission: RE | Admit: 2022-01-24 | Payer: No Typology Code available for payment source | Source: Ambulatory Visit

## 2022-01-26 ENCOUNTER — Encounter (HOSPITAL_COMMUNITY): Payer: No Typology Code available for payment source

## 2022-01-26 ENCOUNTER — Telehealth (HOSPITAL_COMMUNITY): Payer: Self-pay | Admitting: *Deleted

## 2022-01-26 ENCOUNTER — Ambulatory Visit: Payer: No Typology Code available for payment source | Admitting: Nurse Practitioner

## 2022-01-26 NOTE — Progress Notes (Deleted)
Chief Complaint:  ***   Assessment &  Plan   Patient profile:  Cassidy Tran is a 46 y.o. female known to Dr. Bryan Lemma with a past medical history of GERD, gastric AVM , neuroendocrine carcinoma, right hemicolectomy, c-difficile infection, anxiety, asthma, depression, endometriosis, seizure disorder, von Willebrand's disease, tobacco use, Chiari malformation status post VP shunt 2018, hysterectomy. See PMH /PSH for additional history   HPI     [name] has been followed here over the last year for diarrhea and right upper quadrant pain. Hida scan in June showed 13% EF.  No findings on EGD in January 2023 to explain her pain.  No findings of celiac disease. Colonoscopy at time of EGD was remarkable for a polypoid lesion in TI. Path showed well differentiated neuroendocrine tumor grade 1 .  Random biopsies were negative for microscopic colitis . In March 2023 she underwent a right hemicolectomy with ileocolonic anastomosis and resection of 50 cm of small bowel (path: Well differentiated neuroendocrine tumor) .  Also had a cholecystectomy.  Imaging negative for metastasis  Patient's last visit here was 12/15/2021 with Dr. Bryan Lemma, please refer to that note for details.  In summary she was still having variable bowel habits felt possibly to be combination of bile salt diarrhea, postoperative diarrhea and may be IBS.  Dr. Bryan Lemma recommended a trial of fiber in the form of [place].  He recommended a low FODMAP diet.  Creon was put on hold as her pancreatic elastase was normal and she had not noticed any benefit from pancreatic enzyme replacement.  She was continued on Lomotil and advised to take Imodium for breakthrough diarrhea.  Surveillance colonoscopy recommended for 2026.  Advised to follow-up here in 3 months  Interval History:      Previous GI Evaluation    Jan 2023 EGD and colonoscopy EGD -LA grade a reflux esophagitis.  Normal upper third and middle third esophagus a  single bleeding angiectasia in the stomach.  Mild nonulcer gastritis, normal duodenum  Colonoscopy  -Perianal skin tags found on perianal exam. - One 15 mm polyp in the rectum, removed with a hot snare. Resected and retrieved. - A few non-bleeding colonic angiodysplastic lesions. - Non-bleeding internal hemorrhoids. - Normal mucosa in the entire examined colon. Biopsied. - One polypoid lesion in the terminal ileum. Biopsied to evaluate for lipoma vs adenoma.  1. Surgical [P], duodenum - DUODENAL MUCOSA WITH NO SPECIFIC HISTOPATHOLOGIC CHANGES - NEGATIVE FOR INCREASED INTRAEPITHELIAL LYMPHOCYTES OR VILLOUS ARCHITECTURAL CHANGES 2. Surgical [P], gastric anturm and gastric body - GASTRIC ANTRAL AND OXYNTIC MUCOSA WITH NO SPECIFIC HISTOPATHOLOGIC CHANGES - HELICOBACTER PYLORI-LIKE ORGANISMS ARE NOT IDENTIFIED ON ROUTINE H&E STAIN 3. Surgical [P], colon, ileum - WELL-DIFFERENTIATED NEUROENDOCRINE (CARCINOID) TUMOR, GRADE 1. SEE NOTE - NO EVIDENCE OF NECROSIS OR MITOTIC ACTIVITY 4. Surgical [P], right colon - COLONIC MUCOSA WITH NO SPECIFIC HISTOPATHOLOGIC CHANGES - NEGATIVE FOR ACUTE INFLAMMATION, INCREASED INTRAEPITHELIAL LYMPHOCYTES OR THICKENED SUBEPITHELIAL COLLAGEN TABLE 5. Surgical [P], left colon - DUODENAL MUCOSA WITH NO SPECIFIC HISTOPATHOLOGIC CHANGES - NEGATIVE FOR INCREASED INTRAEPITHELIAL LYMPHOCYTES OR VILLOUS ARCHITECTURAL CHANGES 6. Surgical [P], colon, rectum, polyp (1) - TUBULOVILLOUS ADENOMA - NEGATIVE FOR HIGH-GRADE DYSPLASIA OR MALIGNANCY - LATERAL MUCOSAL MARGINS ARE NEGATIVE FOR DYSPLASIA.    Imaging     Labs:     Latest Ref Rng & Units 11/24/2021    8:38 AM 10/24/2021   11:58 AM 09/22/2021    2:40 PM  CBC  WBC 4.0 - 10.5 K/uL 11.3  12.6  12.5   Hemoglobin 12.0 - 15.0 g/dL 12.3  12.0  12.3   Hematocrit 36.0 - 46.0 % 38.1  37.3  38.2   Platelets 150 - 400 K/uL 380  384  367        Latest Ref Rng & Units 11/24/2021    8:38 AM 10/24/2021   11:58 AM  09/22/2021    2:40 PM  Hepatic Function  Total Protein 6.5 - 8.1 g/dL 7.4  7.8  7.3   Albumin 3.5 - 5.0 g/dL 4.4  4.5  4.3   AST 15 - 41 U/L _0 ALT 0 - 44 U/L _1 Alk Phosphatase 38 - 126 U/L 132  129  128   Total Bilirubin 0.3 - 1.2 mg/dL 0.7  0.7  0.8      Past Medical History:  Diagnosis Date   Acid reflux    Allergy    Anxiety    Arthritis    lower back   ASCUS (atypical squamous cells of undetermined significance) on Pap smear 03/2011   NEG HR HPV   Asthma    Cancer (HCC)    skin cancer- age 18ish   Cervical dysplasia, mild 08/2010   LGSIL colposcopy biopsy showing koilocytotic atypia   Clotting disorder ([place])    Depression    Endometriosis    Headache(784.0)    High risk HPV infection 12/2011   Pap normal   Hypertension    Pneumonia    2016ish   Seizures (HCC)    seizure disorder   Smoker    Von Willebrand disease ([place])     Past Surgical History:  Procedure Laterality Date   APPLICATION OF CRANIAL NAVIGATION N/A 11/24/2016   Procedure: APPLICATION OF CRANIAL NAVIGATION;  Surgeon: Kristeen Miss, MD;  Location: [place];  Service: Neurosurgery;  Laterality: N/A;   BRAIN SURGERY     BREAST BIOPSY Left    CHOLECYSTECTOMY N/A 05/05/2021   Procedure: CHOLECYSTECTOMY;  Surgeon: [name], MD;  Location: [facility];  Service: General;  Laterality: N/A;   COLON RESECTION N/A 05/05/2021   Procedure: RIGHT HEMICOLECTOMY;  Surgeon: [name], MD;  Location: [place];  Service: General;  Laterality: N/A;   COLPOSCOPY     CYSTOSCOPY N/A 10/14/2012   Procedure: CYSTOSCOPY;  Surgeon: [name], MD;  Location: [facility];  Service: Gynecology;  Laterality: N/A;   DILATION AND CURETTAGE OF UTERUS     HYSTEROSCOPY WITH D & C  10/14/2010   Procedure: DILATATION AND CURETTAGE (D&C) /HYSTEROSCOPY;  Surgeon: [name], MD;  Location: [facility];  Service: Gynecology;  Laterality: N/A;   LAPAROSCOPIC HYSTERECTOMY N/A 10/14/2012   Procedure:  HYSTERECTOMY TOTAL LAPAROSCOPIC;  Surgeon: [name], MD;  Location: [facility];  Service: Gynecology;  Laterality: N/A;  CPT [id number]  2 1/2 hours  Dr. Uvaldo Rising to assist.   LAPAROSCOPY  04/27/2011   Procedure: LAPAROSCOPY OPERATIVE;  Surgeon: [name], MD;  Location: [facility];  Service: Gynecology;  Laterality: N/A;  removal right cyst , lysis of adhesions, biopsy of peritoneum   LAPAROTOMY N/A 05/05/2021   Procedure: EXPLORATORY LAPAROTOMY;  Surgeon: [name], MD;  Location: [place];  Service: General;  Laterality: N/A;   [place], [place]   PEG PLACEMENT N/A 05/30/2021   Procedure: PERCUTANEOUS ENDOSCOPIC GASTROSTOMY (PEG) PLACEMENT;  Surgeon: [name], MD;  Location: [facility];  Service: General;  Laterality: N/A;   SUBOCCIPITAL CRANIECTOMY CERVICAL LAMINECTOMY N/A 11/06/2016   Procedure: Suboccipital decompression for chiari malformation;  Surgeon: Kristeen Miss, MD;  Location: [place];  Service: Neurosurgery;  Laterality: N/A;   TONSILLECTOMY     TRACHEOSTOMY TUBE PLACEMENT N/A 05/30/2021   Procedure: TRACHEOSTOMY;  Surgeon: [name], MD;  Location: [place] [facility];  Service: General;  Laterality: N/A;   VENTRICULOPERITONEAL SHUNT N/A 11/24/2016   Procedure: SHUNT INSERTION VENTRICULAR-PERITONEAL with Brain[facility];  Surgeon: Kristeen Miss, MD;  Location: [place];  Service: Neurosurgery;  Laterality: N/A;  right side approach   WISDOM TOOTH EXTRACTION  1993    Current Medications, Allergies, Family History and Social History were reviewed in [employer] record.     Current Outpatient Medications  Medication Sig Dispense Refill   acetaminophen (TYLENOL) 325 MG tablet Take 1-2 tablets (325-650 mg total) by mouth every 4 (four) hours as needed for mild pain.     albuterol (VENTOLIN HFA) 108 (90 Base) MCG/ACT inhaler Inhale 2 puffs into the lungs every 6 (six) hours as needed.     budesonide (PULMICORT) 0.5 MG/2ML nebulizer  solution Take 1 mL (0.25 mg total) by nebulization 2 (two) times daily. 60 mL 0   carvedilol (COREG) 3.125 MG tablet Take 1 tablet (3.125 mg total) by mouth 2 (two) times daily with a meal. (Patient taking differently: Take 3.125 mg by mouth 2 (two) times daily with a meal. Taking 1 tablet at night) 60 tablet 0   colestipol (COLESTID) 1 g tablet Can take 1-2 pills twice a day for diarrhea 60 tablet 2   diphenoxylate-atropine (LOMOTIL) 2.5-0.025 MG tablet Take 2 tablets by mouth 4 (four) times daily. (Patient taking differently: Take 2 tablets by mouth 4 (four) times daily as needed.) 30 tablet 0   gabapentin (NEURONTIN) 100 MG capsule Take 1 capsule (100 mg total) by mouth 3 (three) times daily. 270 capsule 3   levETIRAcetam (KEPPRA) 750 MG tablet Take 1 tablet every night 90 tablet 3   loperamide (IMODIUM A-D) 2 MG tablet Take 2 mg by mouth 4 (four) times daily as needed for diarrhea or loose stools.     Magnesium 250 MG TABS Take 250 mg by mouth at bedtime.     mometasone (NASONEX) 50 MCG/ACT nasal spray Use one to two sprays in each nostril once daily 17 g 5   Multiple Vitamin (MULTIVITAMIN WITH MINERALS) TABS tablet Take 1 tablet by mouth daily.     mupirocin ointment (BACTROBAN) 2 % Apply 1 Application topically 3 (three) times daily. 22 g 0   potassium chloride SA (KLOR-CON M) 20 MEQ tablet TAKE 2 TABLETS(40 MEQ) BY MOUTH TWICE DAILY FOR 7 DAYS THEN TAKE 1 TABLET BY MOUTH TWICE DAILY 120 tablet 2   temazepam (RESTORIL) 15 MG capsule Take 1 capsule (15 mg total) by mouth at bedtime as needed for sleep. 30 capsule 0   Tiotropium Bromide-Olodaterol (STIOLTO RESPIMAT) 2.5-2.5 MCG/ACT AERS Inhale 2 puffs into the lungs daily. 4 g 0   tretinoin (RETIN-A) 0.05 % cream Apply 1 application  topically at bedtime as needed (acne).     zonisamide (ZONEGRAN) 100 MG capsule Take 1 capsule every night 90 capsule 3   No current facility-administered medications for this visit.   Facility-Administered  Medications Ordered in Other Visits  Medication Dose Route Frequency Provider Last Rate Last Admin   lanreotide acetate (SOMATULINE DEPOT) injection 120 mg  120 mg Subcutaneous Once Volanda Napoleon, MD  Review of Systems: No chest pain. No shortness of breath. No urinary complaints.    Physical Exam  Wt Readings from Last 3 Encounters:  01/03/22 178 lb 5.6 oz (80.9 kg)  12/16/21 180 lb 12.4 oz (82 kg)  12/15/21 181 lb 3.2 oz (82.2 kg)    LMP 09/16/2012  Constitutional:  Generally well appearing ***female in no acute distress. Psychiatric: Pleasant. Normal mood and affect. Behavior is normal. EENT: Pupils normal.  Conjunctivae are normal. No scleral icterus. Neck supple.  Cardiovascular: Normal rate, regular rhythm. No edema Pulmonary/chest: Effort normal and breath sounds normal. No wheezing, rales or rhonchi. Abdominal: Soft, nondistended, nontender. Bowel sounds active throughout. There are no masses palpable. No hepatomegaly. Neurological: Alert and oriented to person place and time. Skin: Skin is warm and dry. No rashes noted.  Tye Savoy, NP  01/26/2022, 1:28 PM  Cc:  Janie Morning, DO

## 2022-01-26 NOTE — Telephone Encounter (Signed)
Attempted to call Cassidy Tran since she did not show up for PR exercise session. I was able to reach her phone but, I was not able to leave a message due to her mail box being full. Today is her fifth exercise session that she has not shown or called. We have been able to leave a message on her voice mail once and the other calls her mail box was full. We are going to discharge her today and mail her a letter to notify her of this.

## 2022-01-31 ENCOUNTER — Encounter (HOSPITAL_COMMUNITY): Payer: No Typology Code available for payment source

## 2022-01-31 NOTE — Progress Notes (Signed)
Discharge Progress Report  Patient Details  Name: Cassidy Tran MRN: [id number] Date of Birth: May 23, 1975 Referring Provider:   April Tran Pulmonary Rehab Walk Test from 12/16/20[facility] [place]  Referring Provider Cassidy Tran        Number of Visits: 4  Reason for Discharge:  Early Exit:  Lack of attendance  Smoking History:  Social History   Tobacco Use  Smoking Status Former   Packs/day: 0.50   Years: 20.00   Total pack years: 10.00   Types: Cigarettes   Quit date: 05/05/2021   Years since quitting: 0.7  Smokeless Tobacco Never    Diagnosis:  ILD (interstitial lung disease) ([place])  ADL UCSD:  Pulmonary Assessment Scores     Row Name 12/16/21 0935         ADL UCSD   ADL Phase Entry     SOB Score total 6       CAT Score   CAT Score 16       mMRC Score   mMRC Score 0              Initial Exercise Prescription:  Initial Exercise Prescription - 12/16/21 1300       Date of Initial Exercise RX and Referring Provider   Date 12/16/21    Referring Provider Cassidy Tran    Expected Discharge Date 02/23/22      Recumbant Bike   Level 2    Minutes 15    METs 3      NuStep   Level 2    SPM 70    Minutes 15    METs 2      Prescription Details   Frequency (times per week) 2    Duration Progress to 30 minutes of continuous aerobic without signs/symptoms of physical distress      Intensity   THRR 40-80% of Max Heartrate 69-138    Ratings of Perceived Exertion 11-13    Perceived Dyspnea 0-4      Progression   Progression Continue progressive overload as per policy without signs/symptoms or physical distress.      Resistance Training   Training Prescription Yes    Weight red bands    Reps 10-15             Discharge Exercise Prescription (Final Exercise Prescription Changes):  Exercise Prescription Changes - 01/05/22 1640       Response to Exercise   Blood Pressure (Admit) 130/80     Blood Pressure (Exit) 124/72    Heart Rate (Admit) 95 bpm    Heart Rate (Exercise) 94 bpm    Heart Rate (Exit) 98 bpm    Oxygen Saturation (Admit) 97 %    Oxygen Saturation (Exercise) 94 %    Oxygen Saturation (Exit) 98 %    Rating of Perceived Exertion (Exercise) 9    Perceived Dyspnea (Exercise) 1    Duration Continue with 30 min of aerobic exercise without signs/symptoms of physical distress.    Intensity THRR unchanged      Progression   Progression Continue to progress workloads to maintain intensity without signs/symptoms of physical distress.      Resistance Training   Training Prescription Yes    Weight Red Bands    Reps 10-15    Time 10 Minutes      Recumbant Elliptical   Level 2    Minutes 15    METs 4.3      Track  Laps 13    Minutes 15    METs 3             Functional Capacity:  6 Minute Walk     Row Name 12/16/21 1336         6 Minute Walk   Phase Initial     Distance 1532 feet     Walk Time 6 minutes     # of Rest Breaks 0     MPH 2.9     METS 4.69     RPE 6     Perceived Dyspnea  0     VO2 Peak 16.42     Symptoms No     Resting HR 97 bpm     Resting BP 140/82     Resting Oxygen Saturation  98 %     Exercise Oxygen Saturation  during 6 min walk 95 %     Max Ex. HR 119 bpm     Max Ex. BP 160/90     2 Minute Post BP 150/88       Interval HR   1 Minute HR 107     2 Minute HR 113     3 Minute HR 117     4 Minute HR 116     5 Minute HR 117     6 Minute HR 119     2 Minute Post HR 97     Interval Heart Rate? Yes       Interval Oxygen   Interval Oxygen? Yes     Baseline Oxygen Saturation % 98 %     1 Minute Oxygen Saturation % 96 %     1 Minute Liters of Oxygen 0 L     2 Minute Oxygen Saturation % 96 %     2 Minute Liters of Oxygen 0 L     3 Minute Oxygen Saturation % 95 %     3 Minute Liters of Oxygen 0 L     4 Minute Oxygen Saturation % 95 %     4 Minute Liters of Oxygen 0 L     5 Minute Oxygen Saturation % 96 %     5  Minute Liters of Oxygen 0 L     6 Minute Oxygen Saturation % 95 %     6 Minute Liters of Oxygen 0 L     2 Minute Post Oxygen Saturation % 97 %     2 Minute Post Liters of Oxygen 0 L              Psychological, QOL, Others - Outcomes: PHQ 2/9:    12/16/2021    9:01 AM 11/01/2016    2:48 PM 04/30/2015    3:36 PM  Depression screen PHQ 2/9  Decreased Interest 0 0 0  Down, Depressed, Hopeless 1 1 0  PHQ - 2 Score 1 1 0  Altered sleeping 0    Tired, decreased energy 0    Change in appetite 1    Feeling bad or failure about yourself  0    Trouble concentrating 0    Moving slowly or fidgety/restless 0    Suicidal thoughts 0    PHQ-9 Score 2    Difficult doing work/chores Not difficult at all      Quality of Life:   Personal Goals: Goals established at orientation with interventions provided to work toward goal.  Personal Goals and Risk Factors at Admission - 12/16/21 4431  Core Components/Risk Factors/Patient Goals on Admission    Weight Management Weight Loss   Low FODMAP   Improve shortness of breath with ADL's Yes    Intervention Provide education, individualized exercise plan and daily activity instruction to help decrease symptoms of SOB with activities of daily living.    Expected Outcomes Short Term: Improve cardiorespiratory fitness to achieve a reduction of symptoms when performing ADLs;Long Term: Be able to perform more ADLs without symptoms or delay the onset of symptoms              Personal Goals Discharge:  Goals and Risk Factor Review     Row Name 12/19/21 2376 01/11/22 1333           Core Components/Risk Factors/Patient Goals Review   Personal Goals Review Weight Management/Obesity;Improve shortness of breath with ADL's;Develop more efficient breathing techniques such as purse lipped breathing and diaphragmatic breathing and practicing self-pacing with activity. Weight Management/Obesity;Improve shortness of breath with ADL's;Develop more  efficient breathing techniques such as purse lipped breathing and diaphragmatic breathing and practicing self-pacing with activity.      Review Cassidy Tran is scheduled to begin exercise this week. Cassidy Tran has attended 4 session thus far.Her weights have been stable since starting the program without loss or gain. She has been trying several diffenrt types of equipment. Currently we have her walking the track and using the recumbent elliptical. She reports none to mild SOB with exercise and oxygen saturations 94-98% on room air. She has had some difficulty with abdominal pains and the dietician has been working with her to get her back in with GI MD and giving her advise on ways to improve this with her diet.      Expected Outcomes See admission goals See admission goals.               Exercise Goals and Review:  Exercise Goals     Row Name 12/16/21 2831 12/19/21 0820 01/11/22 1416         Exercise Goals   Increase Physical Activity Yes Yes Yes     Intervention Provide advice, education, support and counseling about physical activity/exercise needs.;Develop an individualized exercise prescription for aerobic and resistive training based on initial evaluation findings, risk stratification, comorbidities and participant's personal goals. Provide advice, education, support and counseling about physical activity/exercise needs.;Develop an individualized exercise prescription for aerobic and resistive training based on initial evaluation findings, risk stratification, comorbidities and participant's personal goals. Provide advice, education, support and counseling about physical activity/exercise needs.;Develop an individualized exercise prescription for aerobic and resistive training based on initial evaluation findings, risk stratification, comorbidities and participant's personal goals.     Expected Outcomes Short Term: Attend rehab on a regular basis to increase amount of physical activity.;Long  Term: Add in home exercise to make exercise part of routine and to increase amount of physical activity.;Long Term: Exercising regularly at least 3-5 days a week. Short Term: Attend rehab on a regular basis to increase amount of physical activity.;Long Term: Add in home exercise to make exercise part of routine and to increase amount of physical activity.;Long Term: Exercising regularly at least 3-5 days a week. Short Term: Attend rehab on a regular basis to increase amount of physical activity.;Long Term: Add in home exercise to make exercise part of routine and to increase amount of physical activity.;Long Term: Exercising regularly at least 3-5 days a week.     Increase Strength and Stamina Yes Yes Yes     Intervention Provide advice,  education, support and counseling about physical activity/exercise needs.;Develop an individualized exercise prescription for aerobic and resistive training based on initial evaluation findings, risk stratification, comorbidities and participant's personal goals. Provide advice, education, support and counseling about physical activity/exercise needs.;Develop an individualized exercise prescription for aerobic and resistive training based on initial evaluation findings, risk stratification, comorbidities and participant's personal goals. Provide advice, education, support and counseling about physical activity/exercise needs.;Develop an individualized exercise prescription for aerobic and resistive training based on initial evaluation findings, risk stratification, comorbidities and participant's personal goals.     Expected Outcomes Short Term: Increase workloads from initial exercise prescription for resistance, speed, and METs.;Short Term: Perform resistance training exercises routinely during rehab and add in resistance training at home;Long Term: Improve cardiorespiratory fitness, muscular endurance and strength as measured by increased METs and functional capacity (6MWT)  Short Term: Increase workloads from initial exercise prescription for resistance, speed, and METs.;Short Term: Perform resistance training exercises routinely during rehab and add in resistance training at home;Long Term: Improve cardiorespiratory fitness, muscular endurance and strength as measured by increased METs and functional capacity (6MWT) Short Term: Increase workloads from initial exercise prescription for resistance, speed, and METs.;Short Term: Perform resistance training exercises routinely during rehab and add in resistance training at home;Long Term: Improve cardiorespiratory fitness, muscular endurance and strength as measured by increased METs and functional capacity (6MWT)     Able to understand and use rate of perceived exertion (RPE) scale Yes Yes Yes     Intervention Provide education and explanation on how to use RPE scale Provide education and explanation on how to use RPE scale Provide education and explanation on how to use RPE scale     Expected Outcomes Short Term: Able to use RPE daily in rehab to express subjective intensity level;Long Term:  Able to use RPE to guide intensity level when exercising independently Short Term: Able to use RPE daily in rehab to express subjective intensity level;Long Term:  Able to use RPE to guide intensity level when exercising independently Short Term: Able to use RPE daily in rehab to express subjective intensity level;Long Term:  Able to use RPE to guide intensity level when exercising independently     Able to understand and use Dyspnea scale Yes Yes Yes     Intervention Provide education and explanation on how to use Dyspnea scale Provide education and explanation on how to use Dyspnea scale Provide education and explanation on how to use Dyspnea scale     Expected Outcomes Short Term: Able to use Dyspnea scale daily in rehab to express subjective sense of shortness of breath during exertion;Long Term: Able to use Dyspnea scale to guide  intensity level when exercising independently Short Term: Able to use Dyspnea scale daily in rehab to express subjective sense of shortness of breath during exertion;Long Term: Able to use Dyspnea scale to guide intensity level when exercising independently Short Term: Able to use Dyspnea scale daily in rehab to express subjective sense of shortness of breath during exertion;Long Term: Able to use Dyspnea scale to guide intensity level when exercising independently     Knowledge and understanding of Target Heart Rate Range (THRR) Yes Yes Yes     Intervention Provide education and explanation of THRR including how the numbers were predicted and where they are located for reference Provide education and explanation of THRR including how the numbers were predicted and where they are located for reference Provide education and explanation of THRR including how the numbers were predicted and  where they are located for reference     Expected Outcomes Short Term: Able to state/look up THRR;Long Term: Able to use THRR to govern intensity when exercising independently;Short Term: Able to use daily as guideline for intensity in rehab Short Term: Able to state/look up THRR;Long Term: Able to use THRR to govern intensity when exercising independently;Short Term: Able to use daily as guideline for intensity in rehab Short Term: Able to state/look up THRR;Long Term: Able to use THRR to govern intensity when exercising independently;Short Term: Able to use daily as guideline for intensity in rehab     Understanding of Exercise Prescription Yes Yes Yes     Intervention Provide education, explanation, and written materials on patient's individual exercise prescription Provide education, explanation, and written materials on patient's individual exercise prescription Provide education, explanation, and written materials on patient's individual exercise prescription     Expected Outcomes Short Term: Able to explain program  exercise prescription;Long Term: Able to explain home exercise prescription to exercise independently Short Term: Able to explain program exercise prescription;Long Term: Able to explain home exercise prescription to exercise independently Short Term: Able to explain program exercise prescription;Long Term: Able to explain home exercise prescription to exercise independently              Exercise Goals Re-Evaluation:  Exercise Goals Re-Evaluation     Row Name 12/19/21 0821 01/11/22 1416           Exercise Goal Re-Evaluation   Exercise Goals Review Increase Physical Activity;Increase Strength and Stamina;Able to understand and use rate of perceived exertion (RPE) scale;Able to understand and use Dyspnea scale;Knowledge and understanding of Target Heart Rate Range (THRR);Understanding of Exercise Prescription Increase Physical Activity;Increase Strength and Stamina;Able to understand and use rate of perceived exertion (RPE) scale;Able to understand and use Dyspnea scale;Knowledge and understanding of Target Heart Rate Range (THRR);Understanding of Exercise Prescription      Comments Andreanna is scheduled to begin exercise this week. Eira has completed 4 exercise sessions. She missed the last session. She is currently walking the track for 15 min, 1.87 METs. Then using the recumbent elliptical for 15 min, level 2, METs, 4.3. Pt tolerating well. She performs warm up and cool down independently. Will continue to progress.      Expected Outcomes Through exercise at rehab and home, the patient will decrease shortness of breath with daily activities and feel confident in carrying out an exercise regimen at home. Through exercise at rehab and home, the patient will decrease shortness of breath with daily activities and feel confident in carrying out an exercise regimen at home.               Nutrition & Weight - Outcomes:  Pre Biometrics - 12/16/21 0854       Pre Biometrics   Grip  Strength 10 kg              Nutrition:  Nutrition Therapy & Goals - 01/05/22 1604       Nutrition Therapy   Diet Heart Healthy diet      Personal Nutrition Goals   Nutrition Goal Patient to use the plate method as guide for meal planning to include lean protein/plant protein, fruit, vegetables, whole grains, and low fat dairy as part of heart healthy diet    Personal Goal #2 Patient to identify food sources of saturated fat, trans fat, sodium, and refined carbohydrates    Comments Amery remains pre-contemplative of nutrition changes. She reports difficulty with adherance  to FODMAP diet and continues coffee drinks, regular soda, carbonation, and a variety of high FODMAP foods. She has not started [employer]. She has not started B12 injections, Folate, and is inconsistent with taking MVI. She reports that she has quit smoking and does not drink alcohol. Contacted neurology for follow-up on B12 and folate labs. Contacted gastroenterology regarding difficulty with compliance of fiber supplement recommendations and FODMAP diet. Patient continues to report diarrhea up to 7-15x per day. Reviewed supplement recommendations and FODMAP diet.      Intervention Plan   Intervention Prescribe, educate and counsel regarding individualized specific dietary modifications aiming towards targeted core components such as weight, hypertension, lipid management, diabetes, heart failure and other comorbidities.;Nutrition handout(s) given to patient.    Expected Outcomes Short Term Goal: Understand basic principles of dietary content, such as calories, fat, sodium, cholesterol and nutrients.;Long Term Goal: Adherence to prescribed nutrition plan.             Nutrition Discharge:  Nutrition Assessments - 12/29/21 0952       Rate Your Plate Scores   Pre Score 64             Education Questionnaire Score:  Knowledge Questionnaire Score - 12/16/21 0924       Knowledge Questionnaire Score   Pre  Score 14/18             Goals reviewed with patient; copy given to patient.

## 2022-01-31 NOTE — Addendum Note (Signed)
Encounter addended by: Janine Ores, RN on: 01/31/2022 4:00 PM  Actions taken: Clinical Note Signed, Episode resolved

## 2022-02-02 ENCOUNTER — Encounter (HOSPITAL_COMMUNITY): Payer: No Typology Code available for payment source

## 2022-02-04 ENCOUNTER — Emergency Department (HOSPITAL_BASED_OUTPATIENT_CLINIC_OR_DEPARTMENT_OTHER)
Admission: EM | Admit: 2022-02-04 | Discharge: 2022-02-04 | Disposition: A | Discharge status: Home / Self Care | Payer: No Typology Code available for payment source | Attending: Emergency Medicine | Admitting: Emergency Medicine

## 2022-02-04 ENCOUNTER — Other Ambulatory Visit: Payer: Self-pay

## 2022-02-04 ENCOUNTER — Emergency Department (HOSPITAL_BASED_OUTPATIENT_CLINIC_OR_DEPARTMENT_OTHER): Payer: No Typology Code available for payment source

## 2022-02-04 DIAGNOSIS — R1084 Generalized abdominal pain: Secondary | ICD-10-CM | POA: Diagnosis present

## 2022-02-04 LAB — CBC
HCT: 38.8 % (ref 36.0–46.0)
Hemoglobin: 12.8 g/dL (ref 12.0–15.0)
MCH: 29.4 pg (ref 26.0–34.0)
MCHC: 33 g/dL (ref 30.0–36.0)
MCV: 89 fL (ref 80.0–100.0)
Platelets: 369 10*3/uL (ref 150–400)
RBC: 4.36 MIL/uL (ref 3.87–5.11)
RDW: 13.5 % (ref 11.5–15.5)
WBC: 12.6 10*3/uL — ABNORMAL HIGH (ref 4.0–10.5)
nRBC: 0 % (ref 0.0–0.2)

## 2022-02-04 LAB — COMPREHENSIVE METABOLIC PANEL
ALT: 19 U/L (ref 0–44)
AST: 12 U/L — ABNORMAL LOW (ref 15–41)
Albumin: 4 g/dL (ref 3.5–5.0)
Alkaline Phosphatase: 122 U/L (ref 38–126)
Anion gap: 10 (ref 5–15)
BUN: 13 mg/dL (ref 6–20)
CO2: 26 mmol/L (ref 22–32)
Calcium: 8.8 mg/dL — ABNORMAL LOW (ref 8.9–10.3)
Chloride: 106 mmol/L (ref 98–111)
Creatinine, Ser: 0.68 mg/dL (ref 0.44–1.00)
GFR, Estimated: >60 mL/min (ref >60)
Glucose, Bld: 100 mg/dL — ABNORMAL HIGH (ref 70–99)
Potassium: 3.3 mmol/L — ABNORMAL LOW (ref 3.5–5.1)
Sodium: 142 mmol/L (ref 135–145)
Total Bilirubin: 0.7 mg/dL (ref 0.3–1.2)
Total Protein: 6.9 g/dL (ref 6.5–8.1)

## 2022-02-04 LAB — URINALYSIS, ROUTINE W REFLEX MICROSCOPIC
Bilirubin Urine: NEGATIVE
Glucose, UA: NEGATIVE mg/dL
Hgb urine dipstick: NEGATIVE
Ketones, ur: NEGATIVE mg/dL
Leukocytes,Ua: NEGATIVE
Nitrite: NEGATIVE
Specific Gravity, Urine: >1.046 — ABNORMAL HIGH (ref 1.005–1.030)
pH: 6.5 (ref 5.0–8.0)

## 2022-02-04 LAB — LIPASE, BLOOD: Lipase: 12 U/L (ref 11–51)

## 2022-02-04 MED ORDER — IOHEXOL 300 MG/ML  SOLN
100.0000 mL | Freq: Once | INTRAMUSCULAR | Status: AC | PRN
  Administered 2022-02-04: 100 mL via INTRAVENOUS

## 2022-02-04 MED ORDER — CEPHALEXIN 500 MG PO CAPS: 500.0000 mg | ORAL_CAPSULE | Freq: Two times a day (BID) | ORAL | 0 refills | Status: AC

## 2022-02-04 NOTE — ED Triage Notes (Signed)
Patient arrives with complaints of intermittent right side abdominal pain x3 days. Patient had her gallbladder, appendix, part of her intestines, and uterus removed. Patient initially went to Urgent Care, but she was referred to the ED due to her complex surgical history.  Rates abdominal a 2/10.

## 2022-02-04 NOTE — ED Provider Notes (Signed)
[facility] EMERGENCY DEPT Provider Note   CSN: [id number] Arrival date & time: 02/04/22  1056     History Chief Complaint  Patient presents with   Abdominal Pain   Wound Check    HPI Cassidy Tran is a 47 y.o. female presenting for chief complaint of diffuse abdominal pain.  She has an extensive medical history including an ICU admission earlier this year for carcinoid syndrome with multiple intra-abdominal tumors requiring extensive resections complicated by bifocal pneumonia requiring tracheostomy placement. Since her discharge, she has had chronic diarrhea, has been following with multiple specialists.  She had a PEG tube following her procedures and is having some pain at that site as well as intermittent drainage over the past few months.  She endorses right upper quadrant pain as well intermittently.  She denies fevers or chills, nausea vomiting, syncope or shortness of breath.  She is otherwise ambulatory tolerating p.o. intake..   Patient's recorded medical, surgical, social, medication list and allergies were reviewed in the Snapshot window as part of the initial history.   Review of Systems   Review of Systems  Constitutional:  Negative for chills and fever.  HENT:  Negative for ear pain and sore throat.   Eyes:  Negative for pain and visual disturbance.  Respiratory:  Negative for cough and shortness of breath.   Cardiovascular:  Negative for chest pain and palpitations.  Gastrointestinal:  Positive for abdominal pain and nausea. Negative for vomiting.  Genitourinary:  Negative for dysuria and hematuria.  Musculoskeletal:  Negative for arthralgias and back pain.  Skin:  Negative for color change and rash.  Neurological:  Negative for seizures and syncope.  All other systems reviewed and are negative.   Physical Exam Updated Vital Signs BP 137/84 (BP Location: Right Arm)   Pulse 84   Temp 98.8 F (37.1 C) (Oral)   Resp 17   Ht '5\' 3"'$  (1.6 m)   Wt  77.1 kg   LMP 09/16/2012   SpO2 100%   BMI 30.11 kg/m  Physical Exam Vitals and nursing note reviewed.  Constitutional:      General: She is not in acute distress.    Appearance: She is well-developed.  HENT:     Head: Normocephalic and atraumatic.  Eyes:     Conjunctiva/sclera: Conjunctivae normal.  Cardiovascular:     Rate and Rhythm: Normal rate and regular rhythm.     Heart sounds: No murmur heard. Pulmonary:     Effort: Pulmonary effort is normal. No respiratory distress.     Breath sounds: Normal breath sounds.  Abdominal:     General: There is no distension.     Palpations: Abdomen is soft.     Tenderness: There is abdominal tenderness. There is no right CVA tenderness, left CVA tenderness or guarding.  Musculoskeletal:        General: No swelling or tenderness. Normal range of motion.     Cervical back: Neck supple.  Skin:    General: Skin is warm and dry.     Findings: Lesion (Patient's left abdominal PEG site is slightly erythematous.  Wound is dry and intact.) present.  Neurological:     General: No focal deficit present.     Mental Status: She is alert and oriented to person, place, and time. Mental status is at baseline.     Cranial Nerves: No cranial nerve deficit.      ED Course/ Medical Decision Making/ A&P    Procedures Procedures   Medications Ordered  in ED Medications  iohexol (OMNIPAQUE) 300 MG/ML solution 100 mL (100 mLs Intravenous Contrast Given 02/04/22 1202)   Medical Decision Making:   Cassidy Tran is a 46 y.o. female who presented to the ED today with abdominal pain, detailed above.    Patient placed on continuous vitals and telemetry monitoring while in ED which was reviewed periodically.  Complete initial physical exam performed, notably the patient  was HDS in NAD.     Reviewed and confirmed nursing documentation for past medical history, family history, social history.    Initial Assessment:   With the patient's presentation of  abdominal pain, most likely diagnosis is developing cellulitis of her PEG site.  She has no evidence of ulceration or abscess at this time. Other diagnoses were considered including (but not limited to) gastroenteritis, colitis, small bowel obstruction, appendicitis, cholecystitis, pancreatitis, nephrolithiasis, UTI, pyleonephritis. These are considered less likely due to history of present illness and physical exam findings.   This is most consistent with an acute life/limb threatening illness complicated by underlying chronic conditions.   Initial Plan:  CBC/CMP to evaluate for underlying infectious/metabolic etiology for patient's abdominal pain  Lipase to evaluate for pancreatitis  EKG to evaluate for cardiac source of pain  CTAB/Pelvis with contrast to evaluate for structural/surgical etiology of patients' severe abdominal pain.  Urinalysis and repeat physical assessment to evaluate for UTI/Pyelonpehritis  Empiric management of symptoms with escalating pain control and antiemetics as needed.   Initial Study Results:   Laboratory  All laboratory results reviewed without evidence of clinically relevant pathology.   Exceptions include: chronic leukocytosis    Radiology All images reviewed independently. Agree with radiology report at this time.   CT ABDOMEN PELVIS W CONTRAST  Result Date: 02/04/2022 CLINICAL DATA:  Abdominal pain. Intermittent right-sided abdominal pain for 3 days. History of metastatic neuroendocrine tumor. EXAM: CT ABDOMEN AND PELVIS WITH CONTRAST TECHNIQUE: Multidetector CT imaging of the abdomen and pelvis was performed using the standard protocol following bolus administration of intravenous contrast. RADIATION DOSE REDUCTION: This exam was performed according to the departmental dose-optimization program which includes automated exposure control, adjustment of the mA and/or kV according to patient size and/or use of iterative reconstruction technique. CONTRAST:  156m  OMNIPAQUE IOHEXOL 300 MG/ML  SOLN COMPARISON:  PET-CT 08/17/2021 FINDINGS: Lower chest: No pleural fluid or airspace disease. Hepatobiliary: No focal liver abnormality is seen. Status post cholecystectomy. No biliary dilatation. Pancreas: Unremarkable. No pancreatic ductal dilatation or surrounding inflammatory changes. Spleen: Normal in size without focal abnormality. Adrenals/Urinary Tract: Normal adrenal glands. No nephrolithiasis, hydronephrosis or kidney mass. No hydroureter or ureteral lithiasis. Urinary bladder appears normal. Stomach/Bowel: Stomach appears within normal limits. Postoperative changes from right hemicolectomy with enterocolonic anastomosis. There is no pathologic dilatation of the large or small bowel loops to suggest an obstruction. There is mild soft tissue stranding adjacent to the enterocolonic anastomosis site, image 51/2. Mild wall thickening is identified involving the proximal colon up to the level of the proximal descending colon. The distal descending colon, sigmoid colon and rectum are unremarkable. Vascular/Lymphatic: Aortic atherosclerosis. No aneurysm. No signs of abdominopelvic adenopathy. Reproductive: Status post hysterectomy.  No adnexal mass identified. Other: Trace fluid identified within the left pelvis and inferior pelvis, image 64/2 and image 72/2. No focal fluid collections identified. No signs of pneumoperitoneum. There is a right-sided ventriculoperitoneal shunt. The tube terminates in the right lower quadrant of the abdomen, image 57/2. No fluid collections associated with the catheter. Musculoskeletal: No acute or significant osseous  findings. Bilateral L5 pars defects. Degenerative disc disease noted at L4-5. IMPRESSION: 1. Postoperative changes from right hemicolectomy with enterocolonic anastomosis. There is mild soft tissue stranding adjacent to the enterocolonic anastomosis site which is nonspecific and may be postoperative in etiology. 2. Mild wall thickening  involving the proximal colon up to the level of the proximal descending colon. Correlate for any clinical signs or symptoms of colitis. 3. Trace fluid identified within the left pelvis and inferior pelvis. No focal fluid collections identified. 4. Right-sided ventriculoperitoneal shunt tube terminates in the right lower quadrant of the abdomen. No fluid collections associated with the catheter. 5.  Aortic Atherosclerosis (ICD10-I70.0). Electronically Signed   By: Kerby Moors M.D.   On: 02/04/2022 12:22    Final Reassessment and Plan:   Patient's CT scan with no focal pathology appreciated.  Hemodynamically, patient is within normal limits after 4 hours of observation in the emergency department.  Her pain remains mild.  Uncertain underlying etiology of her pain discussed with the patient.  May be coming from cellulitis this from her PEG site.  Will treat with antibiotics and plan to have her reassessed by her PCP within 48 hours.  She should also be reassessed by her primary surgeon in the outpatient setting as well as be reconnected with her specialists.  She stated understanding of the importance of attending follow-up visits and will plan to schedule these appointments.  Disposition:  I have considered need for hospitalization, however, considering all of the above, I believe this patient is stable for discharge at this time.  Patient/family educated about specific return precautions for given chief complaint and symptoms.  Patient/family educated about follow-up with PCP/gen surg.     Patient/family expressed understanding of return precautions and need for follow-up. Patient spoken to regarding all imaging and laboratory results and appropriate follow up for these results. All education provided in verbal form with additional information in written form. Time was allowed for answering of patient questions. Patient discharged.    Emergency Department Medication Summary:   Medications  iohexol  (OMNIPAQUE) 300 MG/ML solution 100 mL (100 mLs Intravenous Contrast Given 02/04/22 1202)            Clinical Impression:  1. Generalized abdominal pain      Discharge   Final Clinical Impression(s) / ED Diagnoses Final diagnoses:  Generalized abdominal pain    Rx / DC Orders ED Discharge Orders          Ordered    cephALEXin (KEFLEX) 500 MG capsule  2 times daily        02/04/22 1400              Tretha Sciara, MD 02/05/22 [phone]

## 2022-02-07 ENCOUNTER — Encounter (HOSPITAL_COMMUNITY): Payer: No Typology Code available for payment source

## 2022-02-09 ENCOUNTER — Encounter (HOSPITAL_COMMUNITY): Payer: No Typology Code available for payment source

## 2022-02-14 ENCOUNTER — Encounter (HOSPITAL_COMMUNITY): Payer: No Typology Code available for payment source

## 2022-02-16 ENCOUNTER — Encounter (HOSPITAL_COMMUNITY): Payer: No Typology Code available for payment source

## 2022-02-21 ENCOUNTER — Encounter (HOSPITAL_COMMUNITY): Payer: No Typology Code available for payment source

## 2022-02-23 ENCOUNTER — Encounter (HOSPITAL_COMMUNITY): Payer: No Typology Code available for payment source

## 2022-03-15 ENCOUNTER — Encounter: Payer: Self-pay | Admitting: Nurse Practitioner

## 2022-03-16 ENCOUNTER — Encounter: Payer: Self-pay | Admitting: Nurse Practitioner

## 2022-03-16 ENCOUNTER — Ambulatory Visit (INDEPENDENT_AMBULATORY_CARE_PROVIDER_SITE_OTHER): Payer: No Typology Code available for payment source | Admitting: Nurse Practitioner

## 2022-03-16 VITALS — BP 122/88 | HR 97 | Ht 63.5 in | Wt 176.0 lb

## 2022-03-16 DIAGNOSIS — Z780 Asymptomatic menopausal state: Secondary | ICD-10-CM | POA: Diagnosis not present

## 2022-03-16 DIAGNOSIS — Z01419 Encounter for gynecological examination (general) (routine) without abnormal findings: Secondary | ICD-10-CM | POA: Diagnosis not present

## 2022-03-16 NOTE — Progress Notes (Signed)
   Cassidy Tran 1975-08-25 [id number]   History:  47 y.o. G0 presents for annual exam. S/P 2014 [place] for endometriosis/adenomyosis. Stopped ERT this past year after cancer diagnosis. 2023 neuroendocrine carcinoma with multiple intra-abdominal tumors requiring resection, followed by oncology. 2012 LGSIL, 2013 ASCUS positive high-risk HPV, normal since.  History of seizures, von willebrand disease, skin cancer in her 84s. Seeing pulmonology for interstitial lung disease.   Gynecologic History Patient's last menstrual period was 09/16/2012.   Contraception/Family planning: status post hysterectomy Sexually active: Yes  Health Maintenance Last Pap: 01/26/2020. Results were: Normal Last mammogram: 09/04/2016. Results were: Normal Last colonoscopy: 03/11/2021. Results were: Carcinoid tumor. 3-year recall Last Dexa: Not indicated  Past medical history, past surgical history, family history and social history were all reviewed and documented in the EPIC chart. Married. Looking for new employment.   ROS:  A ROS was performed and pertinent positives and negatives are included.  Exam:  Vitals:   03/16/22 1530  BP: 122/88  Pulse: 97  SpO2: 98%  Weight: 176 lb (79.8 kg)  Height: 5' 3.5" (1.613 m)    Body mass index is 30.69 kg/m.  General appearance:  Normal Thyroid:  Symmetrical, normal in size, without palpable masses or nodularity. Respiratory  Auscultation:  Clear without wheezing or rhonchi Cardiovascular  Auscultation:  Regular rate, without rubs, murmurs or gallops  Edema/varicosities:  Not grossly evident Abdominal  Soft,nontender, without masses, guarding or rebound.  Liver/spleen:  No organomegaly noted  Hernia:  None appreciated  Skin  Inspection:  Grossly normal Breasts: Examined lying and sitting.   Right: Without masses, retractions, nipple discharge or axillary adenopathy.   Left: Without masses, retractions, nipple discharge or axillary adenopathy. Genitourinary    Inguinal/mons:  Normal without inguinal adenopathy  External genitalia:  Normal appearing vulva with no masses, tenderness, or lesions  BUS/Urethra/Skene's glands:  Normal  Vagina:  Normal appearing with normal color and discharge, no lesions  Cervix:  Absent  Uterus:  Absent  Adnexa/parametria:     Rt: Normal in size, without masses or tenderness.   Lt: Normal in size, without masses or tenderness.  Anus and perineum: Normal  Digital rectal exam: Deferred  Patient informed chaperone available to be present for breast and pelvic exam. Patient has requested no chaperone to be present. Patient has been advised what will be completed during breast and pelvic exam.   Assessment/Plan:  47 y.o. G0 for annual exam.   Well female exam with routine gynecological exam - Education provided on SBEs, importance of preventative screenings, current guidelines, high calcium diet, regular exercise, and multivitamin daily.  Labs with PCP.   Screening for cervical cancer - 2012 LGSIL, 2013 ASCUS positive high-risk HPV, normal since. No longer screening per guidelines.   Screening for breast cancer - Normal mammogram history. Overdue and encouraged to schedule soon. Normal breast exam today.  Screening for colon cancer - 02/2021 colonoscopy. Carcinoid tumors, 3-year recall.   Return in 1 year for annual.      Tamela Gammon DNP, 3:34 PM 03/16/2022

## 2022-03-21 ENCOUNTER — Encounter: Payer: Self-pay | Admitting: Hematology & Oncology

## 2022-03-21 ENCOUNTER — Inpatient Hospital Stay: Payer: No Typology Code available for payment source | Admitting: Hematology & Oncology

## 2022-03-21 ENCOUNTER — Inpatient Hospital Stay: Payer: No Typology Code available for payment source | Attending: Hematology & Oncology

## 2022-03-21 ENCOUNTER — Other Ambulatory Visit (HOSPITAL_BASED_OUTPATIENT_CLINIC_OR_DEPARTMENT_OTHER): Payer: Self-pay

## 2022-03-21 ENCOUNTER — Other Ambulatory Visit: Payer: Self-pay

## 2022-03-21 ENCOUNTER — Other Ambulatory Visit (HOSPITAL_COMMUNITY): Payer: Self-pay

## 2022-03-21 VITALS — BP 132/70 | HR 96 | Temp 98.0°F | Resp 16 | Ht 63.5 in | Wt 174.0 lb

## 2022-03-21 DIAGNOSIS — D6801 Von willebrand disease, type 1: Secondary | ICD-10-CM | POA: Diagnosis not present

## 2022-03-21 DIAGNOSIS — C7A8 Other malignant neuroendocrine tumors: Secondary | ICD-10-CM

## 2022-03-21 DIAGNOSIS — Z79899 Other long term (current) drug therapy: Secondary | ICD-10-CM | POA: Insufficient documentation

## 2022-03-21 DIAGNOSIS — R197 Diarrhea, unspecified: Secondary | ICD-10-CM | POA: Diagnosis not present

## 2022-03-21 DIAGNOSIS — C7A012 Malignant carcinoid tumor of the ileum: Secondary | ICD-10-CM | POA: Diagnosis present

## 2022-03-21 LAB — CBC WITH DIFFERENTIAL (CANCER CENTER ONLY)
Abs Immature Granulocytes: 0.04 10*3/uL (ref 0.00–0.07)
Basophils Absolute: 0.1 10*3/uL (ref 0.0–0.1)
Basophils Relative: 0 %
Eosinophils Absolute: 0.1 10*3/uL (ref 0.0–0.5)
Eosinophils Relative: 1 %
HCT: 43.2 % (ref 36.0–46.0)
Hemoglobin: 14 g/dL (ref 12.0–15.0)
Immature Granulocytes: 0 %
Lymphocytes Relative: 32 %
Lymphs Abs: 3.8 10*3/uL (ref 0.7–4.0)
MCH: 29.3 pg (ref 26.0–34.0)
MCHC: 32.4 g/dL (ref 30.0–36.0)
MCV: 90.4 fL (ref 80.0–100.0)
Monocytes Absolute: 0.5 10*3/uL (ref 0.1–1.0)
Monocytes Relative: 4 %
Neutro Abs: 7.3 10*3/uL (ref 1.7–7.7)
Neutrophils Relative %: 63 %
Platelet Count: 402 10*3/uL — ABNORMAL HIGH (ref 150–400)
RBC: 4.78 MIL/uL (ref 3.87–5.11)
RDW: 13.8 % (ref 11.5–15.5)
WBC Count: 11.8 10*3/uL — ABNORMAL HIGH (ref 4.0–10.5)
nRBC: 0 % (ref 0.0–0.2)

## 2022-03-21 LAB — CMP (CANCER CENTER ONLY)
ALT: 19 U/L (ref 0–44)
AST: 15 U/L (ref 15–41)
Albumin: 4.5 g/dL (ref 3.5–5.0)
Alkaline Phosphatase: 154 U/L — ABNORMAL HIGH (ref 38–126)
Anion gap: 11 (ref 5–15)
BUN: 14 mg/dL (ref 6–20)
CO2: 27 mmol/L (ref 22–32)
Calcium: 9.9 mg/dL (ref 8.9–10.3)
Chloride: 106 mmol/L (ref 98–111)
Creatinine: 0.77 mg/dL (ref 0.44–1.00)
GFR, Estimated: >60 mL/min (ref >60)
Glucose, Bld: 111 mg/dL — ABNORMAL HIGH (ref 70–99)
Potassium: 3.7 mmol/L (ref 3.5–5.1)
Sodium: 144 mmol/L (ref 135–145)
Total Bilirubin: 0.7 mg/dL (ref 0.3–1.2)
Total Protein: 7.7 g/dL (ref 6.5–8.1)

## 2022-03-21 LAB — LACTATE DEHYDROGENASE: LDH: 198 U/L — ABNORMAL HIGH (ref 98–192)

## 2022-03-21 LAB — MAGNESIUM: Magnesium: 2.1 mg/dL (ref 1.7–2.4)

## 2022-03-21 MED ORDER — OPIUM 10 MG/ML (1%) PO TINC
5.0000 [drp] | ORAL | 0 refills | Status: DC | PRN
  Filled 2022-03-21: qty 118, 30d supply, fill #0

## 2022-03-21 NOTE — Progress Notes (Signed)
Hematology and Oncology Follow Up Visit  Gracen Ringwald [id number] 02-01-1976 47 y.o. 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 Ostrom is back for follow-up.  We last saw her back in September.  Since then, she has still been having problems with diarrhea.  Nothing seems to have helped her with the diarrhea.  She has seen gastroenterology.  I will try her on Tincture of Opium to see if this may help.  Again, I am sure that the diarrhea is probably from her having part of her intestine removed.  So far, we have not found any evidence of carcinoid.  Her last Chromogranin A level was 17.  She does look better.  Looks healthier.  She is more active.  She has had no fever.  She has had no problems with COVID.  There is been no Influenza.  She has had no rashes.  Overall, I would have to say that her performance status is probably ECOG 1.    Medications:  Current Outpatient Medications:    acetaminophen (TYLENOL) 325 MG tablet, Take 1-2 tablets (325-650 mg total) by mouth every 4 (four) hours as needed for mild pain., Disp: , Rfl:    albuterol (VENTOLIN HFA) 108 (90 Base) MCG/ACT inhaler, Inhale 2 puffs into the lungs every 6 (six) hours as needed., Disp: , Rfl:    budesonide (PULMICORT) 0.5 MG/2ML nebulizer solution, Take 1 mL (0.25 mg total) by nebulization 2 (two) times daily., Disp: 60 mL, Rfl: 0   carvedilol (COREG) 3.125 MG tablet, Take 1 tablet (3.125 mg total) by mouth 2 (two) times daily with a meal. (Patient taking differently: Take 3.125 mg by mouth 2 (two) times daily with a meal. Taking 1 tablet at night), Disp: 60 tablet, Rfl: 0   diphenoxylate-atropine (LOMOTIL) 2.5-0.025 MG tablet, Take 2 tablets by mouth 4 (four)  times daily. (Patient taking differently: Take 2 tablets by mouth 4 (four) times daily as needed.), Disp: 30 tablet, Rfl: 0   gabapentin (NEURONTIN) 100 MG capsule, Take 1 capsule (100 mg total) by mouth 3 (three) times daily., Disp: 270 capsule, Rfl: 3   levETIRAcetam (KEPPRA) 750 MG tablet, Take 1 tablet every night, Disp: 90 tablet, Rfl: 3   loperamide (IMODIUM A-D) 2 MG tablet, Take 2 mg by mouth 4 (four) times daily as needed for diarrhea or loose stools., Disp: , Rfl:    Magnesium 250 MG TABS, Take 250 mg by mouth at bedtime., Disp: , Rfl:    mometasone (NASONEX) 50 MCG/ACT nasal spray, Use one to two sprays in each nostril once daily, Disp: 17 g, Rfl: 5   Multiple Vitamin (MULTIVITAMIN WITH MINERALS) TABS tablet, Take 1 tablet by mouth daily., Disp: , Rfl:    potassium chloride SA (KLOR-CON M) 20 MEQ tablet, TAKE 2 TABLETS(40 MEQ) BY MOUTH TWICE DAILY FOR 7 DAYS THEN TAKE 1 TABLET BY MOUTH TWICE DAILY, Disp: 120 tablet, Rfl: 2   temazepam (RESTORIL) 15 MG capsule, Take 1 capsule (15 mg total) by mouth at bedtime as needed for sleep., Disp: 30 capsule, Rfl: 0   Tiotropium Bromide-Olodaterol (STIOLTO RESPIMAT) 2.5-2.5 MCG/ACT AERS, Inhale 2 puffs into the lungs daily., Disp: 4 g, Rfl: 0   zonisamide (ZONEGRAN) 100 MG capsule, Take 1  capsule every night, Disp: 90 capsule, Rfl: 3 No current facility-administered medications for this visit.  Facility-Administered Medications Ordered in Other Visits:    lanreotide acetate (SOMATULINE DEPOT) injection 120 mg, 120 mg, Subcutaneous, Once, Evelynn Hench, Rudell Cobb, MD  Allergies:  Allergies  Allergen Reactions   Aspirin Other (See Comments)    Childhood reaction   Codeine Nausea And Vomiting    Tolerates Hydrocodone   Erythromycin Other (See Comments)    Childhood reaction, tolerate Zpak   Lactose Intolerance (Gi) Diarrhea   Nsaids Nausea And Vomiting   Penicillins Other (See Comments)    Has patient had a PCN reaction causing immediate rash,  facial/tongue/throat swelling, SOB or lightheadedness with hypotension: doesn't remember childhood Reaction  Has patient had a PCN reaction causing severe rash involving mucus membranes or skin necrosis: NO Has patient had a PCN reaction that required hospitalization NO Has patient had a PCN reaction occurring within the last 10 years: NO If all of the above answers are "NO", then may proceed with Cephalosporin use.     Past Medical History, Surgical history, Social history, and Family History were reviewed and updated.  Review of Systems: Review of Systems  Constitutional:  Positive for fatigue and unexpected weight change.  HENT:  Negative.    Eyes: Negative.   Respiratory: Negative.    Cardiovascular: Negative.   Gastrointestinal:  Positive for diarrhea.  Endocrine: Negative.   Genitourinary: Negative.    Musculoskeletal: Negative.   Skin: Negative.   Neurological: Negative.   Hematological: Negative.   Psychiatric/Behavioral: Negative.      Physical Exam:  height is 5' 3.5" (1.613 m) and weight is 174 lb (78.9 kg). Her oral temperature is 98 F (36.7 C). Her blood pressure is 132/70 and her pulse is 96. Her respiration is 16 and oxygen saturation is 97%.   Wt Readings from Last 3 Encounters:  03/21/22 174 lb (78.9 kg)  03/16/22 176 lb (79.8 kg)  02/04/22 170 lb (77.1 kg)    Physical Exam Vitals reviewed.  HENT:     Head: Normocephalic and atraumatic.  Eyes:     Pupils: Pupils are equal, round, and reactive to light.  Cardiovascular:     Rate and Rhythm: Normal rate and regular rhythm.     Heart sounds: Normal heart sounds.     Comments: Cardiac exam shows a regular rate and rhythm with no murmurs, rubs or bruits. Pulmonary:     Effort: Pulmonary effort is normal.     Breath sounds: Normal breath sounds.     Comments: Her lungs sound clear bilaterally.  I hear no wheezing. Abdominal:     General: Bowel sounds are normal.     Palpations: Abdomen is soft.      Comments: Abdominal exam shows soft abdomen.  She has a laparotomy scar that is healed.  This is in the midline.  She has no fluid wave.  Bowel sounds are quite low.  There is no guarding or rebound tenderness.  There is no palpable liver or spleen tip.  Musculoskeletal:        General: No tenderness or deformity. Normal range of motion.     Cervical back: Normal range of motion.  Lymphadenopathy:     Cervical: No cervical adenopathy.  Skin:    General: Skin is warm and dry.     Findings: No erythema or rash.  Neurological:     Mental Status: She is alert and oriented to person, place, and time.  Psychiatric:  Behavior: Behavior normal.        Thought Content: Thought content normal.        Judgment: Judgment normal.     Lab Results  Component Value Date   WBC 11.8 (H) 03/21/2022   HGB 14.0 03/21/2022   HCT 43.2 03/21/2022   MCV 90.4 03/21/2022   PLT 402 (H) 03/21/2022     Chemistry      Component Value Date/Time   NA 144 03/21/2022 0829   K 3.7 03/21/2022 0829   CL 106 03/21/2022 0829   CO2 27 03/21/2022 0829   BUN 14 03/21/2022 0829   CREATININE 0.77 03/21/2022 0829   CREATININE 0.73 01/26/2020 1006      Component Value Date/Time   CALCIUM 9.9 03/21/2022 0829   ALKPHOS 154 (H) 03/21/2022 0829   AST 15 03/21/2022 0829   ALT 19 03/21/2022 0829   BILITOT 0.7 03/21/2022 0829      Impression and Plan: Ms. Purdum is a very nice 47 year old white female.  She has von Willebrand disease.  Her problem now is the history of carcinoid.  She apparently had this resected.  Initial chromogranin A level was quite high at 463.  We gave her a dose of Somatuline.  The follow-up Chromogranin A level was normal at 28.  The Chromogranin A level has kept coming down.  Again, I hope that this diarrhea can be sorted out.  Hopefully the Tincture of Opium will help.  I do not think she needs any scans.  We will see what her Chromogranin A level is.  I will let have her come back to  see me in about 3 months.        Volanda Napoleon, MD 1/23/20249:16 AM

## 2022-03-22 ENCOUNTER — Encounter: Payer: Self-pay | Admitting: Gastroenterology

## 2022-03-22 LAB — CHROMOGRANIN A: Chromogranin A (ng/mL): 42.7 ng/mL (ref 0.0–101.8)

## 2022-03-24 ENCOUNTER — Other Ambulatory Visit: Payer: Self-pay

## 2022-03-24 ENCOUNTER — Telehealth: Payer: Self-pay | Admitting: *Deleted

## 2022-03-24 ENCOUNTER — Other Ambulatory Visit (HOSPITAL_BASED_OUTPATIENT_CLINIC_OR_DEPARTMENT_OTHER): Payer: Self-pay

## 2022-03-24 MED ORDER — BISMUTH SUBSALICYLATE 525 MG/15ML PO SUSP: ORAL | 0 refills | Status: DC

## 2022-03-24 NOTE — Telephone Encounter (Signed)
Message received from patient to inform Dr. Marin Olp that the Opium gtts have been denied by her insurance company, will cost her $469.00 a month out of pocket and would like to know if there is any other medication she can take for diarrhea.  Dr. Marin Olp notified.  Call placed back to patient and patient notified per order of Dr. Marin Olp to take OTC Kaopectate 10 ml PO every 6 hrs as needed for diarrhea.  Teach back done.  Pt is appreciative of call back and has no further questions at this time.

## 2022-03-27 ENCOUNTER — Other Ambulatory Visit: Payer: Self-pay

## 2022-05-25 ENCOUNTER — Encounter: Payer: Self-pay | Admitting: Hematology & Oncology

## 2022-05-28 ENCOUNTER — Telehealth: Payer: Self-pay | Admitting: Physician Assistant

## 2022-05-28 NOTE — Telephone Encounter (Signed)
Patient called this afternoon with complaints of pain across her lower abdomen which has been present over the past couple of days waxing and waning.  She also feels bloated distended and tight in her abdomen.  She says she has not been passing much gas but has chronic diarrhea and has had her usual amount of bowel movements, no fever or chills, no nausea or vomiting.   She does have a complicated history with history of endometriosis by chart, von Willebrand's disease, prior hysterectomy cholecystectomy VP shunt for Chiari malformation and then had a right hemicolectomy in March 2023 with resection of 50 cm of small bowel due to a well-differentiated neuroendocrine tumor.  Patient does not feel bad enough to proceed to the emergency room, she is not asking for pain medicine.  Advise she take Tylenol which she says usually works for her regular strength 2 tablets every 6 hours. Back off to clear liquids for the remainder of the day and if feeling better tomorrow gradually advance diet, try heating pad.  Vies her that if her pain worsens or she develops fever or vomiting she should go to the emergency room for evaluation.  I think she needs to have labs done and probably needs some imaging-CT in December which showed some mild soft tissue stranding adjacent to her enterocolonic anastomosis nonspecific and some mild wall thickening involving the proximal colon up to the level of the proximal descending colon there was a trace amount of fluid in the left pelvis.   [place], please call the patient tomorrow morning to see if she will come in for CBC with differential, and c-Met Lets try to get her an office appointment first available with app or MD-looks like Dr. Bryan Lemma not back for a few more days.

## 2022-05-29 ENCOUNTER — Other Ambulatory Visit: Payer: Self-pay

## 2022-05-29 DIAGNOSIS — R197 Diarrhea, unspecified: Secondary | ICD-10-CM

## 2022-05-29 NOTE — Telephone Encounter (Signed)
Lab orders in epic, pt aware and will come for the labs. Pt scheduled to see Dr. Bryan Lemma 06/14/22 at 10am. Pt aware of appt.

## 2022-05-30 ENCOUNTER — Telehealth: Payer: Self-pay

## 2022-05-30 ENCOUNTER — Encounter (HOSPITAL_BASED_OUTPATIENT_CLINIC_OR_DEPARTMENT_OTHER): Payer: Self-pay | Admitting: Emergency Medicine

## 2022-05-30 ENCOUNTER — Emergency Department (HOSPITAL_BASED_OUTPATIENT_CLINIC_OR_DEPARTMENT_OTHER)
Admission: EM | Admit: 2022-05-30 | Discharge: 2022-05-30 | Disposition: A | Discharge status: Home / Self Care | Payer: No Typology Code available for payment source | Attending: Emergency Medicine | Admitting: Emergency Medicine

## 2022-05-30 ENCOUNTER — Encounter: Payer: Self-pay | Admitting: Hematology & Oncology

## 2022-05-30 ENCOUNTER — Other Ambulatory Visit (INDEPENDENT_AMBULATORY_CARE_PROVIDER_SITE_OTHER): Payer: Self-pay

## 2022-05-30 ENCOUNTER — Other Ambulatory Visit: Payer: Self-pay

## 2022-05-30 DIAGNOSIS — E876 Hypokalemia: Secondary | ICD-10-CM | POA: Diagnosis not present

## 2022-05-30 DIAGNOSIS — J45909 Unspecified asthma, uncomplicated: Secondary | ICD-10-CM | POA: Diagnosis not present

## 2022-05-30 DIAGNOSIS — R197 Diarrhea, unspecified: Secondary | ICD-10-CM | POA: Diagnosis present

## 2022-05-30 LAB — CBC WITH DIFFERENTIAL/PLATELET
Abs Immature Granulocytes: 0.04 10*3/uL (ref 0.00–0.07)
Basophils Absolute: 0.1 10*3/uL (ref 0.0–0.1)
Basophils Absolute: 0.1 10*3/uL (ref 0.0–0.1)
Basophils Relative: 0.6 % (ref 0.0–3.0)
Basophils Relative: 1 %
Eosinophils Absolute: 0.3 10*3/uL (ref 0.0–0.7)
Eosinophils Absolute: 0.3 10*3/uL (ref 0.0–0.5)
Eosinophils Relative: 2.2 % (ref 0.0–5.0)
Eosinophils Relative: 2 %
HCT: 43.6 % (ref 36.0–46.0)
HCT: 42.5 % (ref 36.0–46.0)
Hemoglobin: 14.5 g/dL (ref 12.0–15.0)
Hemoglobin: 14.1 g/dL (ref 12.0–15.0)
Immature Granulocytes: 0 %
Lymphocytes Relative: 31.8 % (ref 12.0–46.0)
Lymphocytes Relative: 38 %
Lymphs Abs: 4.5 10*3/uL — ABNORMAL HIGH (ref 0.7–4.0)
Lymphs Abs: 4.6 10*3/uL — ABNORMAL HIGH (ref 0.7–4.0)
MCH: 28.8 pg (ref 26.0–34.0)
MCHC: 33.2 g/dL (ref 30.0–36.0)
MCHC: 33.2 g/dL (ref 30.0–36.0)
MCV: 87.2 fl (ref 78.0–100.0)
MCV: 86.7 fL (ref 80.0–100.0)
Monocytes Absolute: 0.7 10*3/uL (ref 0.1–1.0)
Monocytes Absolute: 0.5 10*3/uL (ref 0.1–1.0)
Monocytes Relative: 4.7 % (ref 3.0–12.0)
Monocytes Relative: 4 %
Neutro Abs: 8.6 10*3/uL — ABNORMAL HIGH (ref 1.4–7.7)
Neutro Abs: 6.6 10*3/uL (ref 1.7–7.7)
Neutrophils Relative %: 60.7 % (ref 43.0–77.0)
Neutrophils Relative %: 55 %
Platelets: 464 10*3/uL — ABNORMAL HIGH (ref 150.0–400.0)
Platelets: 414 10*3/uL — ABNORMAL HIGH (ref 150–400)
RBC: 5 Mil/uL (ref 3.87–5.11)
RBC: 4.9 MIL/uL (ref 3.87–5.11)
RDW: 14.3 % (ref 11.5–15.5)
RDW: 14.2 % (ref 11.5–15.5)
WBC: 14.2 10*3/uL — ABNORMAL HIGH (ref 4.0–10.5)
WBC: 12.2 10*3/uL — ABNORMAL HIGH (ref 4.0–10.5)
nRBC: 0 % (ref 0.0–0.2)

## 2022-05-30 LAB — COMPREHENSIVE METABOLIC PANEL
ALT: 18 U/L (ref 0–35)
ALT: 17 U/L (ref 0–44)
AST: 18 U/L (ref 0–37)
AST: 13 U/L — ABNORMAL LOW (ref 15–41)
Albumin: 4.3 g/dL (ref 3.5–5.2)
Albumin: 4.4 g/dL (ref 3.5–5.0)
Alkaline Phosphatase: 136 U/L — ABNORMAL HIGH (ref 39–117)
Alkaline Phosphatase: 124 U/L (ref 38–126)
Anion gap: 12 (ref 5–15)
BUN: 10 mg/dL (ref 6–23)
BUN: 14 mg/dL (ref 6–20)
CO2: 30 mEq/L (ref 19–32)
CO2: 30 mmol/L (ref 22–32)
Calcium: 9 mg/dL (ref 8.4–10.5)
Calcium: 9.2 mg/dL (ref 8.9–10.3)
Chloride: 100 mEq/L (ref 96–112)
Chloride: 100 mmol/L (ref 98–111)
Creatinine, Ser: 0.84 mg/dL (ref 0.40–1.20)
Creatinine, Ser: 0.82 mg/dL (ref 0.44–1.00)
GFR, Estimated: >60 mL/min (ref >60)
GFR: 82.26 mL/min (ref >60.00)
Glucose, Bld: 110 mg/dL — ABNORMAL HIGH (ref 70–99)
Glucose, Bld: 118 mg/dL — ABNORMAL HIGH (ref 70–99)
Potassium: 2.5 mEq/L — CL (ref 3.5–5.1)
Potassium: 2.3 mmol/L — CL (ref 3.5–5.1)
Sodium: 139 mEq/L (ref 135–145)
Sodium: 142 mmol/L (ref 135–145)
Total Bilirubin: 0.8 mg/dL (ref 0.2–1.2)
Total Bilirubin: 0.7 mg/dL (ref 0.3–1.2)
Total Protein: 7.4 g/dL (ref 6.0–8.3)
Total Protein: 7.7 g/dL (ref 6.5–8.1)

## 2022-05-30 LAB — BASIC METABOLIC PANEL
Anion gap: 9 (ref 5–15)
BUN: 15 mg/dL (ref 6–20)
CO2: 29 mmol/L (ref 22–32)
Calcium: 8.8 mg/dL — ABNORMAL LOW (ref 8.9–10.3)
Chloride: 103 mmol/L (ref 98–111)
Creatinine, Ser: 0.69 mg/dL (ref 0.44–1.00)
GFR, Estimated: >60 mL/min (ref >60)
Glucose, Bld: 96 mg/dL (ref 70–99)
Potassium: 3 mmol/L — ABNORMAL LOW (ref 3.5–5.1)
Sodium: 141 mmol/L (ref 135–145)

## 2022-05-30 LAB — MAGNESIUM: Magnesium: 2 mg/dL (ref 1.7–2.4)

## 2022-05-30 LAB — LIPASE, BLOOD: Lipase: 16 U/L (ref 11–51)

## 2022-05-30 MED ORDER — MAGNESIUM OXIDE -MG SUPPLEMENT 400 (240 MG) MG PO TABS
400.0000 mg | ORAL_TABLET | Freq: Once | ORAL | Status: AC
  Administered 2022-05-30: 400 mg via ORAL
  Filled 2022-05-30: qty 1

## 2022-05-30 MED ORDER — POTASSIUM CHLORIDE 10 MEQ/100ML IV SOLN
10.0000 meq | INTRAVENOUS | Status: DC
  Administered 2022-05-30 (×3): 10 meq via INTRAVENOUS
  Filled 2022-05-30 (×3): qty 100

## 2022-05-30 MED ORDER — SODIUM CHLORIDE 0.9 % IV SOLN: INTRAVENOUS | Status: DC | PRN

## 2022-05-30 MED ORDER — POTASSIUM CHLORIDE CRYS ER 20 MEQ PO TBCR
60.0000 meq | EXTENDED_RELEASE_TABLET | Freq: Once | ORAL | Status: AC
  Administered 2022-05-30: 60 meq via ORAL
  Filled 2022-05-30: qty 3

## 2022-05-30 NOTE — ED Notes (Signed)
Provider ordered a BMP, wanted it collected after the second bag of Potassium.

## 2022-05-30 NOTE — ED Notes (Signed)
Pt verbalized understanding of d/c instructions, meds, and followup care. Denies questions. VSS, no distress noted. Steady gait to exit with all belongings.  ?

## 2022-05-30 NOTE — ED Triage Notes (Signed)
Pt via pov from home after routine labs indicated a potassium of 2.5. Pt has had diarrhea for 3 years and has been followed by gastroenterology for that time. Pt states she feels fine but was told to come to ED for infusion. Pt alert & oriented, nad noted.

## 2022-05-30 NOTE — ED Notes (Signed)
Pt informed that she was going to be receiving 35mEq x5 due to her Potassium being low... Pt mentioned that she did not want to stay for all the runs of potassium... Pharmacy at [facility] contacted about other options, ex starting a second IV.[name] Pharm at [facility] did not approve the second IV idea... Pharm recommended to have her get all 5 bags but to call them if she refused and they would figure out a pill dosage instead... PA informed of all.Marland KitchenMarland Kitchen

## 2022-05-30 NOTE — Telephone Encounter (Signed)
Message from Nicoletta Ba, PA-C who ordered the labs and received a page notification of the alert values. Spoke with the patient. Advised Amy recommends she go to the ER to be evaluated for IV potassium. Patient declines siting she "can't leave my work." She still thinks she is taking only one potassium pill a day. She asks if she can wait until she sees her PCP in 3 days and let the PCP address this. Explained to the best of my abilities that her potassium level is considered dangerously low. She states, "I feel fine." Advised her signs and symptoms like muscle weakness, muscle cramps and an abnormal heart rhythm or palpitations are possible. Encouraged again to go to the ER. Other option as per Dr Fuller Plan recommendations.

## 2022-05-30 NOTE — Telephone Encounter (Signed)
Called the patient. No answer. Voicemail is full and cannot accept messages.

## 2022-05-30 NOTE — ED Notes (Signed)
Report given to the next RN... 

## 2022-05-30 NOTE — Telephone Encounter (Signed)
Doctor of the day Call from lab for critical lab value Potassium 2.5  Patient of Dr Bryan Lemma. History of endometriosis by chart, von Willebrand's disease, prior hysterectomy cholecystectomy VP shunt for Chiari malformation and then had a right hemicolectomy in March 2023 with resection of 50 cm of small bowel due to a well-differentiated neuroendocrine tumor.  Patient had labs ordered by Nicoletta Ba, PA due to abdominal pain.  Contacted the patient. She is at work presently. Reports chronic diarrhea. Takes daily potassium supplement 20 mEq she says one tablet daily.

## 2022-05-30 NOTE — ED Provider Notes (Signed)
[facility] Provider Note   CSN: [id number] Arrival date & time: 05/30/22  1657     History  Chief Complaint  Patient presents with   hypokalemia    Cassidy Tran is a 47 y.o. female with a past medical history of asthma and chronic diarrhea being followed by GI presenting today with hypokalemia.  She reports that she has had diarrhea for multiple years straight and that she had routine lab work with GI today and her potassium was 2.5.  She has a history of hypomagnesemia however not hypokalemia.  Reports taking a daily potassium and magnesium supplement but that she missed her dose this morning.  No shortness of breath, chest pain, palpitations, cramping, lightheadedness or feelings of presyncope.  HPI     Home Medications Prior to Admission medications   Medication Sig Start Date End Date Taking? Authorizing Provider  acetaminophen (TYLENOL) 325 MG tablet Take 1-2 tablets (325-650 mg total) by mouth every 4 (four) hours as needed for mild pain. 06/24/21   Setzer, Edman Circle, PA-C  albuterol (VENTOLIN HFA) 108 (90 Base) MCG/ACT inhaler Inhale 2 puffs into the lungs every 6 (six) hours as needed. 07/01/21   [provider]  Bismuth Subsalicylate [id number] SUSP Take 10 ml every six hours as needed for diarrhea 03/24/22   Volanda Napoleon, MD  budesonide (PULMICORT) 0.5 MG/2ML nebulizer solution Take 1 mL (0.25 mg total) by nebulization 2 (two) times daily. 06/24/21   Setzer, Edman Circle, PA-C  carvedilol (COREG) 3.125 MG tablet Take 1 tablet (3.125 mg total) by mouth 2 (two) times daily with a meal. Patient taking differently: Take 3.125 mg by mouth 2 (two) times daily with a meal. Taking 1 tablet at night 06/24/21   Setzer, Edman Circle, PA-C  diphenoxylate-atropine (LOMOTIL) 2.5-0.025 MG tablet Take 2 tablets by mouth 4 (four) times daily. Patient taking differently: Take 2 tablets by mouth 4 (four) times daily as needed. 06/24/21   Setzer,  Edman Circle, PA-C  gabapentin (NEURONTIN) 100 MG capsule Take 1 capsule (100 mg total) by mouth 3 (three) times daily. 12/02/21   Cameron Sprang, MD  levETIRAcetam (KEPPRA) 750 MG tablet Take 1 tablet every night 12/02/21   Cameron Sprang, MD  loperamide (IMODIUM A-D) 2 MG tablet Take 2 mg by mouth 4 (four) times daily as needed for diarrhea or loose stools.    [provider]  Magnesium 250 MG TABS Take 250 mg by mouth at bedtime.    [provider]  mometasone (NASONEX) 50 MCG/ACT nasal spray Use one to two sprays in each nostril once daily 09/28/15   Kozlow, Donnamarie Poag, MD  Multiple Vitamin (MULTIVITAMIN WITH MINERALS) TABS tablet Take 1 tablet by mouth daily. 06/25/21   Setzer, Edman Circle, PA-C  potassium chloride SA (KLOR-CON M) 20 MEQ tablet TAKE 2 TABLETS(40 MEQ) BY MOUTH TWICE DAILY FOR 7 DAYS THEN TAKE 1 TABLET BY MOUTH TWICE DAILY 01/20/22   Volanda Napoleon, MD  temazepam (RESTORIL) 15 MG capsule Take 1 capsule (15 mg total) by mouth at bedtime as needed for sleep. 10/24/21   Volanda Napoleon, MD  Tiotropium Bromide-Olodaterol (STIOLTO RESPIMAT) 2.5-2.5 MCG/ACT AERS Inhale 2 puffs into the lungs daily. 06/24/21   Setzer, Edman Circle, PA-C  zonisamide (ZONEGRAN) 100 MG capsule Take 1 capsule every night 12/02/21   Cameron Sprang, MD      Allergies    Aspirin, Codeine, Erythromycin, Lactose intolerance (gi), Nsaids, and Penicillins  Review of Systems   Review of Systems  Physical Exam Updated Vital Signs BP (!) 153/79   Pulse 97   Temp 98.2 F (36.8 C)   Resp 15   Ht 5\' 3"  (1.6 m)   Wt 74.8 kg   LMP 09/16/2012   SpO2 96%   BMI 29.23 kg/m  Physical Exam Vitals and nursing note reviewed.  Constitutional:      General: She is not in acute distress.    Appearance: Normal appearance. She is not ill-appearing.  HENT:     Head: Normocephalic and atraumatic.  Eyes:     General: No scleral icterus.    Conjunctiva/sclera: Conjunctivae normal.  Cardiovascular:     Rate and  Rhythm: Normal rate and regular rhythm.  Pulmonary:     Effort: Pulmonary effort is normal. No respiratory distress.  Skin:    General: Skin is warm and dry.     Findings: No rash.  Neurological:     Mental Status: She is alert.  Psychiatric:        Mood and Affect: Mood normal.     ED Results / Procedures / Treatments   Labs (all labs ordered are listed, but only abnormal results are displayed) Labs Reviewed  CBC WITH DIFFERENTIAL/PLATELET - Abnormal; Notable for the following components:      Result Value   WBC 12.2 (*)    Platelets 414 (*)    Lymphs Abs 4.6 (*)    All other components within normal limits  COMPREHENSIVE METABOLIC PANEL - Abnormal; Notable for the following components:   Potassium 2.3 (*)    Glucose, Bld 118 (*)    AST 13 (*)    All other components within normal limits  BASIC METABOLIC PANEL - Abnormal; Notable for the following components:   Potassium 3.0 (*)    Calcium 8.8 (*)    All other components within normal limits  MAGNESIUM  LIPASE, BLOOD    EKG None  Radiology No results found.  Procedures Procedures   Medications Ordered in ED Medications  potassium chloride 10 mEq in 100 mL IVPB (has no administration in time range)  potassium chloride SA (KLOR-CON M) CR tablet 60 mEq (has no administration in time range)  magnesium oxide (MAG-OX) tablet 400 mg (has no administration in time range)    ED Course/ Medical Decision Making/ A&P Clinical Course as of 05/30/22 1956  Tue May 30, 2022  1824 Potassium(!!): 2.3 [MR]  [id number] I spoke with the patient at bedside.  She says she does not want to stay for any further potassium.  At this time she is only received 1 IV run and 60 mEq orally.  We discussed that this is not a sufficient amount to raise her potassium but she says she cannot stay here for multiple hours.  She reports that she has a primary care that she can follow-up with instead.  After further discussion she will stay for at least 3  IV runs [MR]    Clinical Course User Index [MR] Raegan Winders, Cecilio Asper, PA-C                             Medical Decision Making Amount and/or Complexity of Data Reviewed Labs: ordered. Decision-making details documented in ED Course.  Risk OTC drugs. Prescription drug management.     Past Medical History / Co-morbidities / Social History: Chronic diarrhea   Additional history: Patient follows closely with [facility]  for her diarrhea.  Potassium earlier today was 2.5.   Physical Exam: Completely unremarkable  Lab Tests: I ordered, and personally interpreted labs.  The pertinent results include: Potassium 2.3, normal mag   Cardiac Monitoring:  The patient was maintained on a cardiac monitor.  I viewed and interpreted the cardiac monitored which showed an underlying rhythm of: Normal sinus   Medications: P.o. and IV potassium as well as magnesium   MDM/Disposition: This is a 47 year old female who presented today due to hypokalemia.  Likely from GI wasting.  She is completely asymptomatic of this.  Sent by GI doctor for IV repletion.  2.5 potassium outpatient, 2.3 on her arrival.  After 1 IV run and 60 oral patient requested discharge.  I went and spoke with her at bedside about the need for further potassium.  She ultimately agreed to do 3 rounds IV. BMP revealing of a potassium 3.0 after p.o. and IV runs.  She understood the risks of walking around with low potassium, to include arrhythmia, lightheadedness, dizziness and cardiac arrest.  She will follow-up with her PCP for recheck and potential outpatient infusions.  She will be signed out at this time.  He is already on an outpatient magnesium and potassium supplement and being followed with GI.  No further supplements ordered at this time.    Final Clinical Impression(s) / ED Diagnoses Final diagnoses:  Hypokalemia    Rx / DC Orders ED Discharge Orders     None      Results and diagnoses were explained to the  patient. Return precautions discussed in full. Patient had no additional questions and expressed complete understanding.   This chart was dictated using voice recognition software.  Despite best efforts to proofread,  errors can occur which can change the documentation meaning.     Rhae Hammock, PA-C 05/30/22 2143    Lorelle Gibbs, DO 05/30/22 2332

## 2022-05-30 NOTE — Telephone Encounter (Signed)
Med list shows KCl 20 meq po bid. If this is correct, current then increase to 40 meq po bid for 10 days then reduce to 20 meq. Return to PCP for further mgmt of K.

## 2022-05-30 NOTE — Discharge Instructions (Signed)
You came to the emergency department today with low potassium.  Your level was originally 2.3 but now it is at 3.  We would prefer it to get to 3.5 however you can continue to take outpatient supplement and follow-up with your PCP.  Do not hesitate to return with any worsening symptoms, especially palpitations, dizziness, lightheadedness, feelings though you may lose consciousness.

## 2022-06-06 ENCOUNTER — Encounter: Payer: Self-pay | Admitting: Neurology

## 2022-06-06 ENCOUNTER — Ambulatory Visit: Payer: No Typology Code available for payment source | Admitting: Neurology

## 2022-06-06 DIAGNOSIS — Z029 Encounter for administrative examinations, unspecified: Secondary | ICD-10-CM

## 2022-06-14 ENCOUNTER — Encounter: Payer: Self-pay | Admitting: Hematology & Oncology

## 2022-06-14 ENCOUNTER — Encounter: Payer: Self-pay | Admitting: Gastroenterology

## 2022-06-14 ENCOUNTER — Ambulatory Visit: Payer: No Typology Code available for payment source | Admitting: Gastroenterology

## 2022-06-14 ENCOUNTER — Telehealth: Payer: Self-pay

## 2022-06-14 VITALS — BP 128/80 | HR 98 | Ht 63.0 in | Wt 160.0 lb

## 2022-06-14 DIAGNOSIS — D3A8 Other benign neuroendocrine tumors: Secondary | ICD-10-CM | POA: Diagnosis not present

## 2022-06-14 DIAGNOSIS — Z86010 Personal history of colonic polyps: Secondary | ICD-10-CM

## 2022-06-14 DIAGNOSIS — E876 Hypokalemia: Secondary | ICD-10-CM | POA: Diagnosis not present

## 2022-06-14 DIAGNOSIS — R197 Diarrhea, unspecified: Secondary | ICD-10-CM

## 2022-06-14 MED ORDER — COLESTIPOL HCL 1 G PO TABS: 2.0000 g | ORAL_TABLET | Freq: Every day | ORAL | 3 refills | Status: DC

## 2022-06-14 NOTE — Telephone Encounter (Signed)
-----   Message from Shellia Cleverly, DO sent at 06/14/2022 12:36 PM EDT ----- Can you please order these additional tests for this patient.  I called her to let her know we would be ordering these.  - 24-hour urinary 5-HIAA test.  I am not sure if that is run through our lab or if it has to be done through [facility]? - Fasting serum gastrin -Vaso intestinal peptide  -Also want to repeat a BMP and make sure her potassium is looking fine now  Whenever the orders are in and we know which labs she needs to go to, would you mind giving her call just to let her know.  Thank you!

## 2022-06-14 NOTE — Patient Instructions (Addendum)
We have sent the following medications to your pharmacy for you to pick up at your convenience:  Colestipol 2 Gram ( 2 tablet) daily.  Please follow up in 6 months. Give [place] a call at [phone] to schedule an appointment.  _______________________________________________________  If your blood pressure at your visit was 140/90 or greater, please contact your primary care physician to follow up on this.  ______________________________________________________  If you are age 47 or younger, your body mass index should be between 19-25. Your Body mass index is 28.34 kg/m. If this is out of the aformentioned range listed, please consider follow up with your Primary Care Provider.   __________________________________________________________  The [facility] providers would like to encourage you to use [facility] to communicate with providers for non-urgent requests or questions.  Due to long hold times on the telephone, sending your provider a message by [facility] may be a faster and more efficient way to get a response.  Please allow 48 business hours for a response.  Please remember that this is for non-urgent requests.   Due to recent changes in healthcare laws, you may see the results of your imaging and laboratory studies on MyChart before your provider has had a chance to review them.  We understand that in some cases there may be results that are confusing or concerning to you. Not all laboratory results come back in the same time frame and the provider may be waiting for multiple results in order to interpret others.  Please give [place] 48 hours in order for your provider to thoroughly review all the results before contacting the office for clarification of your results.    Thank you for choosing me and [facility].  Vito Cirigliano, D.O.

## 2022-06-14 NOTE — Telephone Encounter (Signed)
Called patient to let her know to come to our labs downstairs at the basement. Labs are already ordered. Advised patient to come fasting. Stated she will come tomorrow at 7:30 AM tomorrow.

## 2022-06-14 NOTE — Progress Notes (Signed)
Chief Complaint:    Diarrhea  GI History: 47 y.o. female with a past medical history of anxiety, asthma, depression, endometriosis, seizure disorder, von Willebrand disease, tobacco use, hysterectomy, Chiari malformation s/p VP shunt 2018, neuroendocrine tumor, cholecystectomy.   - 12/16/2020: Initial GI appointment for multiple GI symptoms, including diarrhea x1 year. Had unremarkable CBC, CMET, thyroid, CRP, ANA, fecal calprotectin, pancreatic elastase.  C. Difficile+, treated with vancomycin. - 03/11/2021: Colonoscopy showed perianal skin tags, 15 mm polyp in the rectum (TVA), nonbleeding angiodysplastic lesion, internal hemorrhoid.  Polypoid lesion terminal ileum (PATH: well-differentiated neuroendocrine grade 1 tumor).  Random colon biopsies negative macroscopic colitis. - 03/11/2021: EGD: LA grade A reflux esophagitis, single bleeding angiectasia in stomach status post MR clip, hematin in the gastric fundus and body, mild nonulcer gastritis, normal duodenum. Negative celiac, negative H. pylori - 05/05/2021: Right hemicolectomy with ileocolonic anastomosis and resection of 50 cm of small bowel (path: Well differentiated neuroendocrine tumor).  Cholecystectomy.  Prolonged hospital course including trach/PEG MDCT and rehab facility.  Elevated chromogranin A at 463, treated with Somatuline and follows with Dr. Myna Hidalgo - Started on Creon, cholestyramine (no change), Lomotil, Imodium - 08/17/2021: PET CT dotatate: No residual neuroendocrine tumor, no metastasis - 10/10/2021: GI follow-up appointment: Some improvement with Creon.  Recommended colestipol pills, low-fat diet with frequent meals.  Consideration for SIBO testing - 11/16/2021: Chromogranin A 17.1  HPI:     Patient is a 47 y.o. female presenting to the Gastroenterology Clinic for follow-up.  Last seen by me on 12/15/2021.  At that time, still with episodic diarrhea and abdominal cramping, but also days with 0 BM.  Started fiber supplement,  low FODMAP diet, and stopped Creon given no perceived improvement.  Continued on Lomotil, and Imodium prn breakthrough.  Recent labs notable for K 2.5.  Was seen in the ER for this on 05/30/2022, treated with 3 IV rounds of K, with repeat 3.0.  Discharged with KCl to 40 meq twice daily x 10 days, then reduced back to 20 meq bid (as previously prescribed).    Today, she states that she feels well and no abdominal pain. Still with diarrhea, ranging 5-15 BM/day. No change w low FODMAP diet. Takes Lomotil BID (3rd dose prn). Was prescribed tincture of opium, but cost prohibitive.   Still with mildly elevated WBC.   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     albuterol (VENTOLIN HFA) 108 (90 Base) MCG/ACT inhaler Inhale 2 puffs into the lungs every 6 (six) hours as needed.     Bismuth Subsalicylate 525 MG/15ML SUSP Take 10 ml every six hours as needed for diarrhea 236 mL 0   budesonide (PULMICORT) 0.5 MG/2ML nebulizer solution Take 1 mL (0.25 mg total) by nebulization 2 (two) times  daily. 60 mL 0   carvedilol (COREG) 3.125 MG tablet Take 1 tablet (3.125 mg total) by mouth 2 (two) times daily with a meal. (Patient taking differently: Take 3.125 mg by mouth 2 (two) times daily with a meal. Taking 1 tablet at night) 60 tablet 0   diphenoxylate-atropine (LOMOTIL) 2.5-0.025 MG tablet Take 2 tablets by mouth 4 (four) times daily. (Patient taking differently: Take 2 tablets by mouth 4 (four) times daily as needed.) 30 tablet 0   gabapentin (NEURONTIN) 100 MG capsule Take 1 capsule (100 mg total) by mouth 3 (three) times daily. 270 capsule 3   levETIRAcetam (KEPPRA) 750 MG tablet Take 1 tablet every night 90 tablet 3   loperamide (IMODIUM A-D) 2 MG tablet Take 2 mg by mouth 4 (four) times daily as needed for diarrhea or loose stools.     Magnesium 250 MG TABS Take 250 mg by mouth at bedtime.     mometasone (NASONEX) 50 MCG/ACT nasal spray Use one to two sprays in each nostril once daily 17 g 5   Multiple Vitamin (MULTIVITAMIN WITH MINERALS) TABS tablet Take 1 tablet by mouth daily.     potassium chloride SA (KLOR-CON M) 20 MEQ tablet TAKE 2 TABLETS(40 MEQ) BY MOUTH TWICE DAILY FOR 7 DAYS THEN TAKE 1 TABLET BY MOUTH TWICE DAILY 120 tablet 2   temazepam (RESTORIL) 15 MG capsule Take 1 capsule (15 mg total) by mouth at bedtime as needed for sleep. 30 capsule 0   Tiotropium Bromide-Olodaterol (STIOLTO RESPIMAT) 2.5-2.5 MCG/ACT AERS Inhale 2 puffs into the lungs daily. 4 g 0   zonisamide (ZONEGRAN) 100 MG capsule Take 1 capsule every night 90 capsule 3   No current facility-administered medications for this visit.   Facility-Administered Medications Ordered in Other Visits  Medication Dose Route Frequency Provider Last Rate Last Admin   lanreotide acetate (SOMATULINE DEPOT) injection 120 mg  120 mg Subcutaneous Once Josph Macho, MD        Physical Exam:     BP 128/80   Pulse 98   Ht  (1.6 m)   Wt 160 lb (72.6 kg)   LMP 09/16/2012   SpO2 96%   BMI 28.34 kg/m    GENERAL:  Pleasant female in NAD PSYCH: : Cooperative, normal affect NEURO: Alert and oriented x 3, no focal neurologic deficits   IMPRESSION and PLAN:    1) Diarrhea Again discussed potential etiology for her chronic diarrhea.  Suspect she has a pre-existing history of IBS-D, then exacerbation of symptoms after ileocecectomy and cholecystectomy, suggesting component of bile salt diarrhea and postoperative diarrhea.  Was intolerant to cholestyramine powder (too difficult to tolerate).  Symptoms improve, but certainly not resolve with Lomotil.  Not much change with addition of fiber.  No improvement with Creon (and pancreatic elastase was normal).  Otherwise chromogranin A negative after surgery and postoperative imaging negative for recurrence or metastasis.  Plan as follows:  - Try colestipol tablets.  This was prescribed previously, but unsure if she used this or efficacy - Viberzi not an option after ccy - Continue  high-fiber diet with fiber supplement - Continue Lomotil  With regards to potential carcinoid syndrome, while this can be seen in neuroendocrine tumors of the distal small bowel, there is no evidence of liver metastasis on serial imaging, and chromogranin A level now normal.  Otherwise denies flushing.  With that said, given her ongoing symptomatology, we discussed further and feel it is worthwhile to further investigate as below:  - Check urinary 5-HIAA.  Need to avoid tryptophan rich foods and certain medications at least 3 days prior to test.  I verified those lists with the patient today. - Can check fasting gastrin and vaso intestinal peptide to ensure no overlapping gastrinoma or VIPoma - Checking serotonin not recommended  2) History of neuroendocrine tumor Diagnosed via colonoscopy in 02/2021 with NET of the terminal ileum, now s/p right hemicolectomy with resection of 50 cm of small bowel and primary anastomosis in 04/2021.  Chromogranin A was 463 at diagnosis, and  subsequently 17.1 after surgery. - Continue follow-up in the Oncology Clinic.  Patient is in the process of trying to change Oncologist due to driving distance to [place] and she now lives in [place]  3) History of colon polyps - Repeat colonoscopy in 2026 for ongoing surveillance  4) Hypokalemia - Continue K supplement - Repeat BMP check   RTC in 6 months or sooner prn  I spent over 40 minutes of time, including in depth chart review, independent review of results as outlined above, communicating results with the patient directly, face-to-face time with the patient, coordinating care, ordering studies and medications as appropriate, and documentation.           Shellia Cleverly ,DO, FACG 06/14/2022, 9:52 AM

## 2022-06-14 NOTE — Addendum Note (Signed)
Addended by: Coletta Memos on: 06/14/2022 04:14 PM   Modules accepted: Orders

## 2022-06-15 ENCOUNTER — Other Ambulatory Visit (INDEPENDENT_AMBULATORY_CARE_PROVIDER_SITE_OTHER): Payer: No Typology Code available for payment source

## 2022-06-15 ENCOUNTER — Encounter: Payer: Self-pay | Admitting: *Deleted

## 2022-06-15 ENCOUNTER — Other Ambulatory Visit: Payer: Self-pay | Admitting: *Deleted

## 2022-06-15 ENCOUNTER — Telehealth: Payer: Self-pay | Admitting: *Deleted

## 2022-06-15 DIAGNOSIS — R197 Diarrhea, unspecified: Secondary | ICD-10-CM

## 2022-06-15 DIAGNOSIS — D3A8 Other benign neuroendocrine tumors: Secondary | ICD-10-CM

## 2022-06-15 DIAGNOSIS — Z86010 Personal history of colonic polyps: Secondary | ICD-10-CM | POA: Diagnosis not present

## 2022-06-15 DIAGNOSIS — C7A8 Other malignant neuroendocrine tumors: Secondary | ICD-10-CM

## 2022-06-15 DIAGNOSIS — E876 Hypokalemia: Secondary | ICD-10-CM

## 2022-06-15 LAB — BASIC METABOLIC PANEL
BUN: 12 mg/dL (ref 6–23)
CO2: 27 mEq/L (ref 19–32)
Calcium: 9.1 mg/dL (ref 8.4–10.5)
Chloride: 104 mEq/L (ref 96–112)
Creatinine, Ser: 0.73 mg/dL (ref 0.40–1.20)
GFR: 97.32 mL/min (ref >60.00)
Glucose, Bld: 93 mg/dL (ref 70–99)
Potassium: 3.7 mEq/L (ref 3.5–5.1)
Sodium: 141 mEq/L (ref 135–145)

## 2022-06-15 NOTE — Progress Notes (Signed)
PATIENT NAVIGATOR PROGRESS NOTE  Name: Cassidy Tran Date: 06/15/2022 MRN: [id number]  DOB: May 24, 1975   Reason for visit:  New Patient appt  Comments:  Spoke with [name] and she would like to transfer care to Dr Truett Perna due to location. Referral entered and reviewed new patient appt with [name] for 06/26/22 at 1:40 pm Reviewed directions and parking and gave her contact information    Time spent counseling/coordinating care: 30-45 minutes

## 2022-06-15 NOTE — Telephone Encounter (Signed)
Patient called to cancel 06/21/22 appointment with Dr. Myna Hidalgo, she is transferring her care to Dr. Truett Perna.

## 2022-06-19 ENCOUNTER — Other Ambulatory Visit: Payer: No Typology Code available for payment source

## 2022-06-19 DIAGNOSIS — Z86010 Personal history of colonic polyps: Secondary | ICD-10-CM

## 2022-06-19 DIAGNOSIS — D3A8 Other benign neuroendocrine tumors: Secondary | ICD-10-CM

## 2022-06-19 DIAGNOSIS — R197 Diarrhea, unspecified: Secondary | ICD-10-CM

## 2022-06-19 DIAGNOSIS — E876 Hypokalemia: Secondary | ICD-10-CM

## 2022-06-21 ENCOUNTER — Ambulatory Visit: Payer: No Typology Code available for payment source | Admitting: Hematology & Oncology

## 2022-06-21 ENCOUNTER — Inpatient Hospital Stay: Payer: No Typology Code available for payment source

## 2022-06-26 ENCOUNTER — Inpatient Hospital Stay: Payer: No Typology Code available for payment source | Attending: Hematology & Oncology | Admitting: Oncology

## 2022-06-26 VITALS — BP 157/95 | HR 96 | Temp 98.2°F | Resp 18 | Ht 63.0 in | Wt 169.6 lb

## 2022-06-26 DIAGNOSIS — C7A8 Other malignant neuroendocrine tumors: Secondary | ICD-10-CM | POA: Diagnosis not present

## 2022-06-26 DIAGNOSIS — D75839 Thrombocytosis, unspecified: Secondary | ICD-10-CM | POA: Diagnosis not present

## 2022-06-26 DIAGNOSIS — R197 Diarrhea, unspecified: Secondary | ICD-10-CM | POA: Diagnosis not present

## 2022-06-26 DIAGNOSIS — D72829 Elevated white blood cell count, unspecified: Secondary | ICD-10-CM | POA: Diagnosis not present

## 2022-06-26 DIAGNOSIS — D3A012 Benign carcinoid tumor of the ileum: Secondary | ICD-10-CM | POA: Insufficient documentation

## 2022-06-26 DIAGNOSIS — F17210 Nicotine dependence, cigarettes, uncomplicated: Secondary | ICD-10-CM | POA: Insufficient documentation

## 2022-06-26 DIAGNOSIS — C7A012 Malignant carcinoid tumor of the ileum: Secondary | ICD-10-CM

## 2022-06-26 NOTE — Progress Notes (Signed)
[facility] New Patient Consult   Requesting MD: Irena Reichmann, Do [address] [place],  [place] [address]   Cassidy Tran 47 y.o.  December 30, 1975    Reason for Consult: Carcinoid tumor   HPI: Cassidy Tran reports diarrhea the past 3.5 years.  She saw Dr. Barron Alvine and was taken for colonoscopy and upper endoscopy in January 2023.  A single bleeding angiectasia was noted in the stomach.  The colonoscopy revealed a rectal polyp and a few nonbleeding colonic angiodysplastic lesions.  A polypoid lesion was noted in the terminal ileum.  The lesion was biopsied. The pathology revealed a well-differentiated neuroendocrine tumor, grade one of the ileum.  The rectal polyp returned as a tubulovillous adenoma.  [name] was taken to the operating room for a small bowel resection and right colectomy on 05/05/2021.  A mass was noted at 50 cm proximal to the ileocecal valve.  The small bowel was resected.  When the bowel was open the mass appeared more consistent with a Meckel's diverticulum.  A mass was palpated proximal to ileocecal valve.  A right colectomy was performed. The pathology revealed a well-differentiated neuroendocrine tumor, grade 1, unifocal of the small bowel measuring 1.5 x 1 x 0.9 cm.  The mitotic rate was measured at less than 2 and the Ki-67 returned at less than 3%.  Tumor invaded the muscularis.  No lymphovascular invasion.  1/7 lymph nodes contained metastatic tumor,pT2pN1  She had a long postoperative recovery including respiratory failure requiring intubation and tracheostomy placement.  She reports worsening of the diarrhea following surgery.  She received a single dose of lanreotide 08/23/2021.  This did not help the diarrhea and was discontinued.  A dotatate PET on 08/17/2021 revealed no residual neuroendocrine tumor.  She has continued follow-up with Dr. Barron Alvine.  She was started on colestipol tablets 06/14/2022 she reports marked improvement in the  diarrhea since starting the colestipol.  She takes Lomotil as needed.  She reports diarrhea has decreased from 12-15 times per day to 2-3 times daily since starting colestipol. She submitted a 24-hour urine 5-HIAA last week.  This is pending.  A gastrin level was normal 06/15/2022.  The chromogranin A level returned normal 03/21/2022.   Past Medical History:  Diagnosis Date   Acid reflux    Allergy    Anxiety    Arthritis    lower back   ASCUS (atypical squamous cells of undetermined significance) on Pap smear 03/2011   NEG HR HPV   Asthma    Cancer (HCC)    skin cancer- age 18ish   Cervical dysplasia, mild 08/2010   LGSIL colposcopy biopsy showing koilocytotic atypia   Clotting disorder (HCC)    Depression    Endometriosis    Headache(784.0)    High risk HPV infection 12/2011   Pap normal   Hypertension    Pneumonia    2016ish   Seizures (HCC)    seizure disorder   Smoker    Von Willebrand disease (HCC)     .  Chiari malformation   .  Migraine headaches   .  Neuropathy in the lower legs and feet following surgery March 2023   Past Surgical History:  Procedure Laterality Date   APPLICATION OF CRANIAL NAVIGATION N/A 11/24/2016   Procedure: APPLICATION OF CRANIAL NAVIGATION;  Surgeon: Barnett Abu, MD;  Location: [facility] OR;  Service: Neurosurgery;  Laterality: N/A;   BRAIN SURGERY     BREAST BIOPSY Left    CHOLECYSTECTOMY  N/A 05/05/2021   Procedure: CHOLECYSTECTOMY;  Surgeon: Diamantina Monks, MD;  Location: [facility] OR;  Service: General;  Laterality: N/A;   COLON RESECTION N/A 05/05/2021   Procedure: RIGHT HEMICOLECTOMY;  Surgeon: Diamantina Monks, MD;  Location: [facility] OR;  Service: General;  Laterality: N/A;   COLPOSCOPY     CYSTOSCOPY N/A 10/14/2012   Procedure: CYSTOSCOPY;  Surgeon: Dara Lords, MD;  Location: [facility] ORS;  Service: Gynecology;  Laterality: N/A;   DILATION AND CURETTAGE OF UTERUS     HYSTEROSCOPY WITH D & C  10/14/2010   Procedure: DILATATION AND CURETTAGE  (D&C) /HYSTEROSCOPY;  Surgeon: Dara Lords, MD;  Location: [facility];  Service: Gynecology;  Laterality: N/A;   LAPAROSCOPIC HYSTERECTOMY N/A 10/14/2012   Procedure: HYSTERECTOMY TOTAL LAPAROSCOPIC;  Surgeon: Dara Lords, MD;  Location: [facility];  Service: Gynecology;  Laterality: N/A;  CPT [id number]  2 1/2 hours  Dr. Reynaldo Minium to assist.   LAPAROSCOPY  04/27/2011   Procedure: LAPAROSCOPY OPERATIVE;  Surgeon: Dara Lords, MD;  Location: [facility];  Service: Gynecology;  Laterality: N/A;  removal right cyst , lysis of adhesions, biopsy of peritoneum   LAPAROTOMY N/A 05/05/2021   Procedure: EXPLORATORY LAPAROTOMY;  Surgeon: Diamantina Monks, MD;  Location: [facility];  Service: General;  Laterality: N/A;   NASAL SINUS SURGERY  1993   IN [place], [place]   PEG PLACEMENT N/A 05/30/2021   Procedure: PERCUTANEOUS ENDOSCOPIC GASTROSTOMY (PEG) PLACEMENT;  Surgeon: Diamantina Monks, MD;  Location: [facility];  Service: General;  Laterality: N/A;   SUBOCCIPITAL CRANIECTOMY CERVICAL LAMINECTOMY N/A 11/06/2016   Procedure: Suboccipital decompression for chiari malformation;  Surgeon: Barnett Abu, MD;  Location: [facility];  Service: Neurosurgery;  Laterality: N/A;   TONSILLECTOMY     TRACHEOSTOMY TUBE PLACEMENT N/A 05/30/2021   Procedure: TRACHEOSTOMY;  Surgeon: Diamantina Monks, MD;  Location: [facility];  Service: General;  Laterality: N/A;   VENTRICULOPERITONEAL SHUNT N/A 11/24/2016   Procedure: SHUNT INSERTION VENTRICULAR-PERITONEAL with Brainlab;  Surgeon: Barnett Abu, MD;  Location: [facility];  Service: Neurosurgery;  Laterality: N/A;  right side approach   WISDOM TOOTH EXTRACTION  1993    Medications: Reviewed  Allergies:  Allergies  Allergen Reactions   Aspirin Other (See Comments)    Childhood reaction   Codeine Nausea And Vomiting    Tolerates Hydrocodone   Erythromycin Other (See Comments)    Childhood reaction, tolerate Zpak   Lactose Intolerance (Gi) Diarrhea   Nsaids Nausea And Vomiting    Penicillins Other (See Comments)    Has patient had a PCN reaction causing immediate rash, facial/tongue/throat swelling, SOB or lightheadedness with hypotension: doesn't remember childhood Reaction  Has patient had a PCN reaction causing severe rash involving mucus membranes or skin necrosis: NO Has patient had a PCN reaction that required hospitalization NO Has patient had a PCN reaction occurring within the last 10 years: NO If all of the above answers are "NO", then may proceed with Cephalosporin use.     Family history: A sister had non-Hodgkin's lymphoma and a neuroendocrine tumor.  A paternal second cousin had a neuroendocrine tumor.  Her father had prostate cancer  Social History:   She lives with her husband in [place].  She worked in Clinical biochemist.  She smokes cigarettes.  Rare alcohol use, none in the past year.  No transfusion history.  No risk factor for HIV or hepatitis.  ROS:   Positives include: Hot flashes, diarrhea (decreased since starting colestipol  2 weeks ago), numbness/tingling/pain in the feet following abdominal surgery March 2023, migraine take once weekly  A complete ROS was otherwise negative.  Physical Exam:  Blood pressure (!) 157/95, pulse 96, temperature 98.2 F (36.8 C), resp. rate 18, height 5\' 3"  (1.6 m), weight 169 lb 9.6 oz (76.9 kg), last menstrual period 09/16/2012, SpO2 100 %.  HEENT: Oropharynx without visible mass, neck without mass Lungs: Clear bilaterally Cardiac: Regular rate and rhythm Abdomen: No hepatosplenomegaly, no mass, nontender  Vascular: No leg edema Lymph nodes: No cervical, supraclavicular, axillary, or inguinal nodes Neurologic: Alert and oriented, the motor exam appears intact in the upper and lower extremities bilaterally Skin: Multiple tattoos, no rash Musculoskeletal: No spine tenderness   LAB:  CBC  Lab Results  Component Value Date   WBC 12.2 (H) 05/30/2022   HGB 14.1 05/30/2022   HCT 42.5 05/30/2022    MCV 86.7 05/30/2022   PLT 414 (H) 05/30/2022   NEUTROABS 6.6 05/30/2022        CMP  Lab Results  Component Value Date   NA 141 06/15/2022   K 3.7 06/15/2022   CL 104 06/15/2022   CO2 27 06/15/2022   GLUCOSE 93 06/15/2022   BUN 12 06/15/2022   CREATININE 0.73 06/15/2022   CALCIUM 9.1 06/15/2022   PROT 7.7 05/30/2022   ALBUMIN 4.4 05/30/2022   AST 13 (L) 05/30/2022   ALT 17 05/30/2022   ALKPHOS 124 05/30/2022   BILITOT 0.7 05/30/2022   GFRNONAA >60 05/30/2022   GFRAA >60 11/24/2016       Assessment/Plan:   Carcinoid tumor of distal ileum, status post a right colectomy 05/06/2022 Colonoscopy 03/11/2021-polypoid lesion in the terminal ileum-well-differentiated neuroendocrine tumor Stage III, pT2 pN1, 1/7 nodes, grade 1, Ki-67 less than 3%, no lymphovascular invasion, negative margins Dotatate PET 08/17/2021-no evidence of residual neuroendocrine tumor Lanreotide 08/23/2021-discontinued after 1 dose Diarrhea predating and following the right colectomy and small bowel resection Improved with colestipol April 2024 Chiari malformation, status post VP shunt 11/24/2016 Seizure disorder Neuropathy in the lower legs and feet following abdominal surgery March 2023 History of migraine headaches Von Willebrand's disease 8.   Asthma 9.  Hypertension 10.  Respiratory failure March 2023, intubated 05/12/2021, tracheostomy 05/30/2021, off ventilator 05/31/2021 11.  C. difficile colitis 12/16/2020 12.  Leukocytosis-likely secondary to ongoing tobacco use    Disposition:   Ms. Bugh was diagnosed with a carcinoid tumor of the distal ileum in January 2024.  She underwent a right colectomy and concurrent small bowel resection in March 2024.  A staging dotatate PET on 08/17/2021 revealed no evidence of residual neuroendocrine tumor.  She is in clinical remission.  I suspect the ongoing diarrhea is related to the right colectomy and small bowel resection.  She reports marked improvement in  diarrhea since starting colestipol.  She will continue follow-up with Dr. Barron Alvine for management of diarrhea.  We will follow-up on the 24-hour urine 5 HIAA obtained last week.  She will return for an office visit and chromogranin a level in 4 months.  She has a history of mild leukocytosis and thrombocytosis.  I suspect the leukocytosis is secondary to ongoing tobacco use.  The white count has been intermittently mildly elevated over the past year.  We will check a CBC when she returns in 4 months.  I have a low clinical suspicion for a myeloproliferative disorder.  Thornton Papas, MD  06/26/2022, 1:53 PM

## 2022-06-27 LAB — 5 HIAA, QUANTITATIVE, URINE, 24 HOUR
5 HIAA, 24 Hour Urine: 2.2 mg/24 h (ref <=6.0)
Total Volume: 500 mL

## 2022-06-29 ENCOUNTER — Telehealth: Payer: Self-pay | Admitting: *Deleted

## 2022-06-29 ENCOUNTER — Encounter: Payer: Self-pay | Admitting: Gastroenterology

## 2022-06-29 NOTE — Telephone Encounter (Signed)
Notified of normal 5-HIAA test. F/U as scheduled.

## 2022-07-07 LAB — VASOACTIVE INTESTINAL PEPTIDE (VIP): Vasoactive Intest Polypeptide: 60 pg/mL (ref <78)

## 2022-07-07 LAB — GASTRIN: Gastrin: 39 pg/mL (ref <=100)

## 2022-09-22 ENCOUNTER — Telehealth (HOSPITAL_COMMUNITY): Payer: Self-pay

## 2022-09-22 NOTE — Telephone Encounter (Signed)
Called pt to let them know about the Pulmonary Wellness program at [facility] No answer. VM left.

## 2022-10-20 ENCOUNTER — Encounter: Payer: Self-pay | Admitting: Hematology & Oncology

## 2022-10-27 ENCOUNTER — Inpatient Hospital Stay: Payer: 59 | Attending: Oncology

## 2022-10-27 ENCOUNTER — Inpatient Hospital Stay (HOSPITAL_BASED_OUTPATIENT_CLINIC_OR_DEPARTMENT_OTHER): Payer: 59 | Admitting: Oncology

## 2022-10-27 VITALS — BP 151/88 | HR 95 | Temp 98.2°F | Resp 18 | Ht 63.0 in | Wt 170.9 lb

## 2022-10-27 DIAGNOSIS — Z85030 Personal history of malignant carcinoid tumor of large intestine: Secondary | ICD-10-CM | POA: Insufficient documentation

## 2022-10-27 DIAGNOSIS — C7A8 Other malignant neuroendocrine tumors: Secondary | ICD-10-CM

## 2022-10-27 LAB — CBC WITH DIFFERENTIAL (CANCER CENTER ONLY)
Abs Immature Granulocytes: 0.03 10*3/uL (ref 0.00–0.07)
Basophils Absolute: 0 10*3/uL (ref 0.0–0.1)
Basophils Relative: 0 %
Eosinophils Absolute: 0.1 10*3/uL (ref 0.0–0.5)
Eosinophils Relative: 1 %
HCT: 40 % (ref 36.0–46.0)
Hemoglobin: 13.2 g/dL (ref 12.0–15.0)
Immature Granulocytes: 0 %
Lymphocytes Relative: 32 %
Lymphs Abs: 3.9 10*3/uL (ref 0.7–4.0)
MCH: 29.3 pg (ref 26.0–34.0)
MCHC: 33 g/dL (ref 30.0–36.0)
MCV: 88.9 fL (ref 80.0–100.0)
Monocytes Absolute: 0.6 10*3/uL (ref 0.1–1.0)
Monocytes Relative: 4 %
Neutro Abs: 7.7 10*3/uL (ref 1.7–7.7)
Neutrophils Relative %: 63 %
Platelet Count: 332 10*3/uL (ref 150–400)
RBC: 4.5 MIL/uL (ref 3.87–5.11)
RDW: 14.1 % (ref 11.5–15.5)
WBC Count: 12.4 10*3/uL — ABNORMAL HIGH (ref 4.0–10.5)
nRBC: 0 % (ref 0.0–0.2)

## 2022-10-27 NOTE — Progress Notes (Signed)
  [facility] OFFICE PROGRESS NOTE   Diagnosis: Carcinoid tumor  INTERVAL HISTORY:   Ms. Divincenzo returns as scheduled.  She continues to have frequent loose bowel movements.  Colestid helps.  She continues smoking.  No flushing or fever.  Objective:  Vital signs in last 24 hours:  Blood pressure (!) 151/88, pulse 95, temperature 98.2 F (36.8 C), temperature source Oral, resp. rate 18, height 5\' 3"  (1.6 m), weight 170 lb 14.4 oz (77.5 kg), last menstrual period 09/16/2012, SpO2 98%.     Lymphatics: No cervical, supraclavicular, axillary, or inguinal nodes Resp: Distant breath sounds, scattered bilateral inspiratory/expiratory wheeze, no respiratory distress Cardio: Regular rate and rhythm GI: No hepatosplenomegaly, nontender, no mass Vascular: No leg edema   Lab Results:  Lab Results  Component Value Date   WBC 12.4 (H) 10/27/2022   HGB 13.2 10/27/2022   HCT 40.0 10/27/2022   MCV 88.9 10/27/2022   PLT 332 10/27/2022   NEUTROABS 7.7 10/27/2022    CMP  Lab Results  Component Value Date   NA 141 06/15/2022   K 3.7 06/15/2022   CL 104 06/15/2022   CO2 27 06/15/2022   GLUCOSE 93 06/15/2022   BUN 12 06/15/2022   CREATININE 0.73 06/15/2022   CALCIUM 9.1 06/15/2022   PROT 7.7 05/30/2022   ALBUMIN 4.4 05/30/2022   AST 13 (L) 05/30/2022   ALT 17 05/30/2022   ALKPHOS 124 05/30/2022   BILITOT 0.7 05/30/2022   GFRNONAA >60 05/30/2022   GFRAA >60 11/24/2016    No results found for: "CEA1", "CEA", "VHQ469", "CA125"  Lab Results  Component Value Date   INR 0.9 01/10/2021   LABPROT 12.4 01/10/2021    Imaging:  No results found.  Medications: I have reviewed the patient's current medications.   Assessment/Plan: Carcinoid tumor of distal ileum, status post a right colectomy 05/06/2022 Colonoscopy 03/11/2021-polypoid lesion in the terminal ileum-well-differentiated neuroendocrine tumor Stage III, pT2 pN1, 1/7 nodes, grade 1, Ki-67 less than 3%, no  lymphovascular invasion, negative margins Dotatate PET 08/17/2021-no evidence of residual neuroendocrine tumor Lanreotide 08/23/2021-discontinued after 1 dose Normal 24-hour urine 5 HIAA 06/19/2022 Diarrhea predating and following the right colectomy and small bowel resection Improved with colestipol April 2024 Chiari malformation, status post VP shunt 11/24/2016 Seizure disorder Neuropathy in the lower legs and feet following abdominal surgery March 2023 History of migraine headaches Von Willebrand's disease 8.   Asthma 9.  Hypertension 10.  Respiratory failure March 2023, intubated 05/12/2021, tracheostomy 05/30/2021, off ventilator 05/31/2021 11.  C. difficile colitis 12/16/2020 12.  Leukocytosis-likely secondary to ongoing tobacco use     Disposition: Ms. Alders remains in clinical remission from the carcinoid tumor.  We will follow-up on the chromogranin a level from today.  She will return for an office visit and chromogranin a level in 8 months.  She will continue follow-up with gastroenterology for management of chronic diarrhea.  She has mild leukocytosis, likely secondary to ongoing tobacco use.  The platelet count is normal today.  Thornton Papas, MD  10/27/2022  10:30 AM

## 2022-10-31 ENCOUNTER — Other Ambulatory Visit: Payer: Self-pay | Admitting: Family Medicine

## 2022-10-31 DIAGNOSIS — Z1231 Encounter for screening mammogram for malignant neoplasm of breast: Secondary | ICD-10-CM

## 2022-10-31 LAB — CHROMOGRANIN A: Chromogranin A (ng/mL): 48.4 ng/mL (ref 0.0–101.8)

## 2022-11-01 ENCOUNTER — Encounter: Payer: Self-pay | Admitting: *Deleted

## 2022-11-04 ENCOUNTER — Encounter: Payer: Self-pay | Admitting: Hematology & Oncology

## 2022-11-06 ENCOUNTER — Ambulatory Visit
Admission: RE | Admit: 2022-11-06 | Discharge: 2022-11-06 | Disposition: A | Discharge status: Home / Self Care | Payer: 59 | Source: Ambulatory Visit

## 2022-11-06 DIAGNOSIS — Z1231 Encounter for screening mammogram for malignant neoplasm of breast: Secondary | ICD-10-CM

## 2022-12-08 ENCOUNTER — Other Ambulatory Visit: Payer: Self-pay | Admitting: Neurology

## 2022-12-08 ENCOUNTER — Ambulatory Visit (INDEPENDENT_AMBULATORY_CARE_PROVIDER_SITE_OTHER): Payer: 59 | Admitting: Adult Health

## 2022-12-10 ENCOUNTER — Other Ambulatory Visit: Payer: Self-pay | Admitting: Neurology

## 2022-12-11 ENCOUNTER — Other Ambulatory Visit: Payer: Self-pay | Admitting: Neurology

## 2022-12-11 NOTE — Telephone Encounter (Signed)
I left message to call the office to discuss this medication.

## 2023-01-07 ENCOUNTER — Other Ambulatory Visit: Payer: Self-pay | Admitting: Medical Genetics

## 2023-01-07 DIAGNOSIS — Z006 Encounter for examination for normal comparison and control in clinical research program: Secondary | ICD-10-CM

## 2023-02-05 ENCOUNTER — Other Ambulatory Visit (HOSPITAL_COMMUNITY): Payer: Self-pay | Attending: Medical Genetics

## 2023-03-06 ENCOUNTER — Other Ambulatory Visit: Payer: Self-pay | Admitting: Neurology

## 2023-03-09 ENCOUNTER — Ambulatory Visit: Payer: 59 | Admitting: Gastroenterology

## 2023-03-09 ENCOUNTER — Encounter: Payer: Self-pay | Admitting: Gastroenterology

## 2023-03-09 VITALS — BP 140/88 | HR 100 | Ht 63.0 in | Wt 174.0 lb

## 2023-03-09 DIAGNOSIS — Z85068 Personal history of other malignant neoplasm of small intestine: Secondary | ICD-10-CM | POA: Diagnosis not present

## 2023-03-09 DIAGNOSIS — R197 Diarrhea, unspecified: Secondary | ICD-10-CM

## 2023-03-09 DIAGNOSIS — Z9049 Acquired absence of other specified parts of digestive tract: Secondary | ICD-10-CM | POA: Diagnosis not present

## 2023-03-09 DIAGNOSIS — D3A8 Other benign neuroendocrine tumors: Secondary | ICD-10-CM

## 2023-03-09 DIAGNOSIS — Z860100 Personal history of colon polyps, unspecified: Secondary | ICD-10-CM | POA: Diagnosis not present

## 2023-03-09 DIAGNOSIS — Z87891 Personal history of nicotine dependence: Secondary | ICD-10-CM

## 2023-03-09 NOTE — Patient Instructions (Signed)
 Increase your Colestipol  to twice daily.   Please purchase the following medications over the counter and take as directed: Ibgard - as directed.   Follow-up 1 year, sooner if needed.   _______________________________________________________  If your blood pressure at your visit was 140/90 or greater, please contact your primary care physician to follow up on this.  _______________________________________________________  If you are age 50 or older, your body mass index should be between 23-30. Your Body mass index is 30.82 kg/m. If this is out of the aforementioned range listed, please consider follow up with your Primary Care Provider.  If you are age 4 or younger, your body mass index should be between 19-25. Your Body mass index is 30.82 kg/m. If this is out of the aformentioned range listed, please consider follow up with your Primary Care Provider.   ________________________________________________________  The [facility] providers would like to encourage you to use MYCHART to communicate with providers for non-urgent requests or questions.  Due to long hold times on the telephone, sending your provider a message by [facility] may be a faster and more efficient way to get a response.  Please allow 48 business hours for a response.  Please remember that this is for non-urgent requests.  _______________________________________________________  It was a pleasure to see you today!  Vito Cirigliano, D.O.

## 2023-03-09 NOTE — Progress Notes (Signed)
 Chief Complaint:    Diarrhea  GI History: 48 y.o. female with a past medical history of anxiety, asthma, depression, endometriosis, seizure disorder, von Willebrand disease, tobacco use, hysterectomy, Chiari malformation s/p VP shunt 2018, neuroendocrine tumor, cholecystectomy.   - 12/16/2020: Initial GI appointment for multiple GI symptoms, including diarrhea x1 year. Had unremarkable CBC, CMET, thyroid , CRP, ANA, fecal calprotectin, pancreatic elastase.  C. Difficile+, treated with vancomycin . - 03/11/2021: Colonoscopy showed perianal skin tags, 15 mm polyp in the rectum (TVA), nonbleeding angiodysplastic lesion, internal hemorrhoid.  Polypoid lesion terminal ileum (PATH: well-differentiated neuroendocrine grade 1 tumor).  Random colon biopsies negative macroscopic colitis. - 03/11/2021: EGD: LA grade A reflux esophagitis, single bleeding angiectasia in stomach status post MR clip, hematin in the gastric fundus and body, mild nonulcer gastritis, normal duodenum. Negative celiac, negative H. pylori - 05/05/2021: Right hemicolectomy with ileocolonic anastomosis and resection of 50 cm of small bowel (path: Well differentiated neuroendocrine tumor).  Cholecystectomy.  Prolonged hospital course including trach/PEG MDCT and rehab facility.  Elevated chromogranin A at 463, treated with Somatuline and follows with Dr. Timmy - For the diarrhea, has trialed fiber supplement, low FODMAP diet, Creon  (no change), cholestyramine  (no change), Lomotil , Imodium , tincture of opium  (because prohibitive) - 08/17/2021: PET CT dotatate: No residual neuroendocrine tumor, no metastasis - 10/10/2021: GI follow-up appointment: Some improvement with Creon .  Recommended colestipol  pills, low-fat diet with frequent meals.  Consideration for SIBO testing - 11/16/2021: Chromogranin A 17.1 - 05/2022: Normal 5 HIAA, fasting gastrin, vasoactive intestinal peptide  HPI:     Patient is a 48 y.o. female presenting to the  Gastroenterology Clinic for follow-up.  Was last seen by me on 06/14/2022.  Was overall doing well, but still with diarrhea ranging 5-15 BM/day.  Was using Lomotil  bid.  Prescribed colestipol  tablets again. - Normal urinary 5 HIAA, fasting gastrin, vaso intestinal peptide  Patient contacted me on 06/29/2022 to let me know that the colestipol  was working very well and was a miracle medicine, down to 2-3 BM/day.  Was seen by Dr. Cloretta in the Oncology Clinic on 10/27/2022.  In clinical remission from carcinoid and repeat chromogranin level 48.4  Today, she states she is back to having 10 BM/day. Taking colestipol  2 gm Bid. Will use Lomotil  or Imodium  prn, with only mild improvement.     Review of systems:     No chest pain, no SOB, no fevers, no urinary sx   Past Medical History:  Diagnosis Date   Acid reflux    Allergy    Anxiety    Arthritis    lower back   ASCUS (atypical squamous cells of undetermined significance) on Pap smear 03/2011   NEG HR HPV   Asthma    Cancer (HCC)    skin cancer- age 18ish   Cervical dysplasia, mild 08/2010   LGSIL colposcopy biopsy showing koilocytotic atypia   Clotting disorder (HCC)    Depression    Endometriosis    Headache(784.0)    High risk HPV infection 12/2011   Pap normal   Hypertension    Pneumonia    2016ish   Seizures (HCC)    seizure disorder   Smoker    Von Willebrand disease (HCC)     Patient's surgical history, family medical history, social history, medications and allergies were all reviewed in Epic    Current Outpatient Medications  Medication Sig Dispense Refill   acetaminophen  (TYLENOL ) 325 MG tablet Take 1-2 tablets (325-650 mg total) by mouth every  4 (four) hours as needed for mild pain.     budesonide  (PULMICORT ) 0.5 MG/2ML nebulizer solution Take 1 mL (0.25 mg total) by nebulization 2 (two) times daily. 60 mL 0   carvedilol  (COREG ) 3.125 MG tablet Take 1 tablet (3.125 mg total) by mouth 2 (two) times daily with a  meal. (Patient taking differently: Take 3.125 mg by mouth 2 (two) times daily with a meal. Taking 1 tablet at night) 60 tablet 0   colestipol  (COLESTID ) 1 g tablet Take 2 tablets (2 g total) by mouth daily. 180 tablet 3   diphenoxylate -atropine  (LOMOTIL ) 2.5-0.025 MG tablet Take 2 tablets by mouth 4 (four) times daily. (Patient not taking: Reported on 03/09/2023) 30 tablet 0   gabapentin  (NEURONTIN ) 100 MG capsule TAKE 1 CAPSULE(100 MG) BY MOUTH THREE TIMES DAILY 270 capsule 0   levETIRAcetam  (KEPPRA ) 750 MG tablet TAKE 1 TABLET BY MOUTH EVERY NIGHT 90 tablet 0   loperamide  (IMODIUM  A-D) 2 MG tablet Take 2 mg by mouth 4 (four) times daily as needed for diarrhea or loose stools.     Magnesium  250 MG TABS Take 250 mg by mouth at bedtime.     mometasone  (NASONEX ) 50 MCG/ACT nasal spray Use one to two sprays in each nostril once daily 17 g 5   potassium chloride  SA (KLOR-CON  M) 20 MEQ tablet TAKE 2 TABLETS(40 MEQ) BY MOUTH TWICE DAILY FOR 7 DAYS THEN TAKE 1 TABLET BY MOUTH TWICE DAILY 120 tablet 2   Tiotropium Bromide -Olodaterol (STIOLTO RESPIMAT ) 2.5-2.5 MCG/ACT AERS Inhale 2 puffs into the lungs daily. 4 g 0   zonisamide  (ZONEGRAN ) 100 MG capsule TAKE 1 CAPSULE BY MOUTH EVERY NIGHT 90 capsule 0   No current facility-administered medications for this visit.   Facility-Administered Medications Ordered in Other Visits  Medication Dose Route Frequency Provider Last Rate Last Admin   lanreotide acetate  (SOMATULINE DEPOT ) injection 120 mg  120 mg Subcutaneous Once Ennever, Peter R, MD        Physical Exam:     BP (!) 140/88   Pulse 100   Ht 5' 3 (1.6 m)   Wt 174 lb (78.9 kg)   LMP 09/16/2012   BMI 30.82 kg/m   GENERAL:  Pleasant female in NAD PSYCH: : Cooperative, normal affect Musculoskeletal:  Normal muscle tone, normal strength NEURO: Alert and oriented x 3, no focal neurologic deficits   IMPRESSION and PLAN:    1) Diarrhea 2) History of right hemicolectomy As previously discussed  with patient, suspect pre-existing history of IBS-D, then exacerbated after ileocecectomy and cholecystectomy.  Clinical improvement with colestipol , but seemingly waning efficacy over time.  Plan for the following:  - Increase colestipol  to 4 gm BID - Patient to call me in 2 weeks to let me know how she is doing with the new dosing, and can continue uptitrating if needed - Ok to continue Lomotil  or Imodium  on demand - Will trial course of IBgard - Trial course of Gas-X on demand for flatulence  3) History of neuroendocrine tumor Diagnosed via colonoscopy in 02/2021 with NET of the terminal ileum, now s/p right hemicolectomy with resection of 50 cm of small bowel and primary anastomosis in 04/2021.  Chromogranin A was 463 at diagnosis, and subsequently normalized postoperatively - Continue regular follow-up in the Oncology Clinic  4) History of colon polyps - Repeat colonoscopy in 02/2024 for ongoing surveillance  RTC in 1 year or sooner as needed  Sandor LULLA Flatter ,DO, FACG 03/09/2023, 9:34 AM

## 2023-03-26 ENCOUNTER — Ambulatory Visit: Payer: No Typology Code available for payment source | Admitting: Obstetrics and Gynecology

## 2023-03-29 ENCOUNTER — Other Ambulatory Visit: Payer: Self-pay | Admitting: Neurology

## 2023-04-06 ENCOUNTER — Encounter: Payer: Self-pay | Admitting: Hematology & Oncology

## 2023-04-13 ENCOUNTER — Ambulatory Visit: Payer: No Typology Code available for payment source | Admitting: Obstetrics and Gynecology

## 2023-04-16 ENCOUNTER — Encounter: Payer: Self-pay | Admitting: Hematology & Oncology

## 2023-04-16 ENCOUNTER — Ambulatory Visit (INDEPENDENT_AMBULATORY_CARE_PROVIDER_SITE_OTHER): Payer: 59 | Admitting: Obstetrics and Gynecology

## 2023-04-16 ENCOUNTER — Encounter: Payer: Self-pay | Admitting: Obstetrics and Gynecology

## 2023-04-16 VITALS — BP 164/86 | HR 85 | Temp 98.0°F | Ht 62.6 in | Wt 175.0 lb

## 2023-04-16 DIAGNOSIS — R3912 Poor urinary stream: Secondary | ICD-10-CM | POA: Diagnosis not present

## 2023-04-16 DIAGNOSIS — Z01419 Encounter for gynecological examination (general) (routine) without abnormal findings: Secondary | ICD-10-CM | POA: Diagnosis not present

## 2023-04-16 HISTORY — DX: Encounter for gynecological examination (general) (routine) without abnormal findings: Z01.419

## 2023-04-16 NOTE — Progress Notes (Signed)
48 y.o. G0P0 female s/p hysterectomy here for annual exam. Married. Clinical cytogeneticist- works hybrid schedule.  Urinary difficulty at times - sometimes she has to push it out. Weak stream.  Patient's last menstrual period was 09/16/2012.   Abnormal bleeding: none Pelvic discharge or pain: none Breast mass, nipple discharge or skin changes : none Last PAP: No results found for: "DIAGPAP", "HPVHIGH", "ADEQPAP" Last mammogram: 11/06/22 BIRADS 1, density b Last colonoscopy: 03/11/21 Sexually active: yes Exercising: walks Smoker: yes  GYN HISTORY: No significant history  OB History  Gravida Para Term Preterm AB Living  0       SAB IAB Ectopic Multiple Live Births          Past Medical History:  Diagnosis Date   Acid reflux    Allergy    Anxiety    Arthritis    lower back   ASCUS (atypical squamous cells of undetermined significance) on Pap smear 03/2011   NEG HR HPV   Asthma    Cancer (HCC)    skin cancer- age 18ish   Cervical dysplasia, mild 08/2010   LGSIL colposcopy biopsy showing koilocytotic atypia   Clotting disorder (HCC)    Depression    Endometriosis    Headache(784.0)    High risk HPV infection 12/2011   Pap normal   Hypertension    Pneumonia    2016ish   Seizures (HCC)    seizure disorder   Smoker    Von Willebrand disease (HCC)    Well woman exam with routine gynecological exam 04/16/2023    Past Surgical History:  Procedure Laterality Date   APPLICATION OF CRANIAL NAVIGATION N/A 11/24/2016   Procedure: APPLICATION OF CRANIAL NAVIGATION;  Surgeon: Barnett Abu, MD;  [facility];  Service: Neurosurgery;  Laterality: N/A;   BRAIN SURGERY     BREAST BIOPSY Left 02/2016   CHOLECYSTECTOMY N/A 05/05/2021   Procedure: CHOLECYSTECTOMY;  Surgeon: Diamantina Monks, MD;  [facility];  Service: General;  Laterality: N/A;   COLON RESECTION N/A 05/05/2021   Procedure: RIGHT HEMICOLECTOMY;  Surgeon: Diamantina Monks, MD;  [facility];   Service: General;  Laterality: N/A;   COLPOSCOPY     CYSTOSCOPY N/A 10/14/2012   Procedure: CYSTOSCOPY;  Surgeon: Dara Lords, MD;  Location: [facility];  Service: Gynecology;  Laterality: N/A;   DILATION AND CURETTAGE OF UTERUS     HYSTEROSCOPY WITH D & C  10/14/2010   Procedure: DILATATION AND CURETTAGE (D&C) /HYSTEROSCOPY;  Surgeon: Dara Lords, MD;  Location: [facility];  Service: Gynecology;  Laterality: N/A;   LAPAROSCOPIC HYSTERECTOMY N/A 10/14/2012   Procedure: HYSTERECTOMY TOTAL LAPAROSCOPIC;  Surgeon: Dara Lords, MD;  Location[facility];  Service: Gynecology;  Laterality: N/A;  CPT [id number]  2 1/2 hours  Dr. Reynaldo Minium to assist.   LAPAROSCOPY  04/27/2011   Procedure: LAPAROSCOPY OPERATIVE;  Surgeon: Dara Lords, MD;  Location: [facility];  Service: Gynecology;  Laterality: N/A;  removal right cyst , lysis of adhesions, biopsy of peritoneum   LAPAROTOMY N/A 05/05/2021   Procedure: EXPLORATORY LAPAROTOMY;  Surgeon: Diamantina Monks, MD;  [facility];  Service: General;  Laterality: N/A;   NASAL SINUS SURGERY  1993   IN [place], [place]   PEG PLACEMENT N/A 05/30/2021   Procedure: PERCUTANEOUS ENDOSCOPIC GASTROSTOMY (PEG) PLACEMENT;  Surgeon: Diamantina Monks, MD;  [facility];  Service: General;  Laterality: N/A;   SUBOCCIPITAL CRANIECTOMY CERVICAL LAMINECTOMY N/A 11/06/2016  Procedure: Suboccipital decompression for chiari malformation;  Surgeon: Barnett Abu, MD;  Location: [facility] OR;  Service: Neurosurgery;  Laterality: N/A;   TONSILLECTOMY     TRACHEOSTOMY TUBE PLACEMENT N/A 05/30/2021   Procedure: TRACHEOSTOMY;  Surgeon: Diamantina Monks, MD;  [facility];  Service: General;  Laterality: N/A;   VENTRICULOPERITONEAL SHUNT N/A 11/24/2016   Procedure: SHUNT INSERTION VENTRICULAR-PERITONEAL with Brainlab;  Surgeon: Barnett Abu, MD;  [facility];  Service: Neurosurgery;  Laterality: N/A;  right side approach   WISDOM TOOTH EXTRACTION  1993    Current  Outpatient Medications on File Prior to Visit  Medication Sig Dispense Refill   acetaminophen (TYLENOL) 325 MG tablet Take 1-2 tablets (325-650 mg total) by mouth every 4 (four) hours as needed for mild pain.     budesonide (PULMICORT) 0.5 MG/2ML nebulizer solution Take 1 mL (0.25 mg total) by nebulization 2 (two) times daily. 60 mL 0   carvedilol (COREG) 3.125 MG tablet Take 1 tablet (3.125 mg total) by mouth 2 (two) times daily with a meal. (Patient taking differently: Take 3.125 mg by mouth 2 (two) times daily with a meal. Taking 1 tablet at night) 60 tablet 0   colestipol (COLESTID) 1 g tablet Take 2 tablets (2 g total) by mouth daily. 180 tablet 3   diphenoxylate-atropine (LOMOTIL) 2.5-0.025 MG tablet Take 2 tablets by mouth 4 (four) times daily. 30 tablet 0   doxepin (SINEQUAN) 25 MG capsule Take 25 mg by mouth at bedtime.     gabapentin (NEURONTIN) 100 MG capsule TAKE 1 CAPSULE(100 MG) BY MOUTH THREE TIMES DAILY 270 capsule 0   levETIRAcetam (KEPPRA) 750 MG tablet TAKE 1 TABLET BY MOUTH EVERY NIGHT 90 tablet 0   loperamide (IMODIUM A-D) 2 MG tablet Take 2 mg by mouth 4 (four) times daily as needed for diarrhea or loose stools.     Magnesium 250 MG TABS Take 250 mg by mouth at bedtime.     mometasone (NASONEX) 50 MCG/ACT nasal spray Use one to two sprays in each nostril once daily 17 g 5   potassium chloride SA (KLOR-CON M) 20 MEQ tablet TAKE 2 TABLETS(40 MEQ) BY MOUTH TWICE DAILY FOR 7 DAYS THEN TAKE 1 TABLET BY MOUTH TWICE DAILY 120 tablet 2   Tiotropium Bromide-Olodaterol (STIOLTO RESPIMAT) 2.5-2.5 MCG/ACT AERS Inhale 2 puffs into the lungs daily. 4 g 0   triamcinolone ointment (KENALOG) 0.1 % Apply topically 2 (two) times daily.     zonisamide (ZONEGRAN) 100 MG capsule TAKE 1 CAPSULE BY MOUTH EVERY NIGHT 90 capsule 0   Current Facility-Administered Medications on File Prior to Visit  Medication Dose Route Frequency Provider Last Rate Last Admin   lanreotide acetate (SOMATULINE DEPOT)  injection 120 mg  120 mg Subcutaneous Once Ennever, Rose Phi, MD        Social History   Socioeconomic History   Marital status: Married    Spouse name: Not on file   Number of children: Not on file   Years of education: Not on file   Highest education level: Not on file  Occupational History   Occupation: Customer Service  Tobacco Use   Smoking status: Some Days    Current packs/day: 0.00    Average packs/day: 0.5 packs/day for 20.0 years (10.0 ttl pk-yrs)    Types: Cigarettes    Start date: 05/05/2001    Last attempt to quit: 05/05/2021    Years since quitting: 1.9   Smokeless tobacco: Never  Vaping Use  Vaping status: Never Used  Substance and Sexual Activity   Alcohol use: Not Currently    Comment: quit since March   Drug use: No   Sexual activity: Yes    Birth control/protection: Surgical    Comment: Hyst, First IC @ 52 y/o, >5 Partners  Other Topics Concern   Not on file  Social History Narrative   Right handed    Live with husband and dog in a one level home.    Caffeine rarely   Social Drivers of Corporate investment banker Strain: Not on file  Food Insecurity: No Food Insecurity (06/26/2022)   Hunger Vital Sign    Worried About Running Out of Food in the Last Year: Never true    Ran Out of Food in the Last Year: Never true  Transportation Needs: No Transportation Needs (06/26/2022)   PRAPARE - Administrator, Civil Service (Medical): No    Lack of Transportation (Non-Medical): No  Physical Activity: Not on file  Stress: Not on file  Social Connections: Not on file  Intimate Partner Violence: Not At Risk (06/26/2022)   Humiliation, Afraid, Rape, and Kick questionnaire    Fear of Current or Ex-Partner: No    Emotionally Abused: No    Physically Abused: No    Sexually Abused: No    Family History  Problem Relation Age of Onset   Hypertension Mother    Heart disease Mother    Hyperlipidemia Mother    Diabetes Father    Hypertension Father     Heart disease Father    Hyperlipidemia Father    Stroke Father    Prostate cancer Father    Lymphoma Sister        NON-HODGKINS LYMPHOMA   Cancer Maternal Uncle        Lung and pancreatic cancer   Colon cancer Neg Hx    Rectal cancer Neg Hx    Esophageal cancer Neg Hx     Allergies  Allergen Reactions   Aspirin Other (See Comments)    Childhood reaction   Codeine Nausea And Vomiting    Tolerates Hydrocodone   Erythromycin Other (See Comments)    Childhood reaction, tolerate Zpak   Lactose Intolerance (Gi) Diarrhea   Nsaids Nausea And Vomiting   Penicillins Other (See Comments)    Has patient had a PCN reaction causing immediate rash, facial/tongue/throat swelling, SOB or lightheadedness with hypotension: doesn't remember childhood Reaction  Has patient had a PCN reaction causing severe rash involving mucus membranes or skin necrosis: NO Has patient had a PCN reaction that required hospitalization NO Has patient had a PCN reaction occurring within the last 10 years: NO If all of the above answers are "NO", then may proceed with Cephalosporin use.       PE Today's Vitals   04/16/23 1109  BP: (!) 164/86  Pulse: 85  Temp: 98 F (36.7 C)  TempSrc: Oral  SpO2: 98%  Weight: 175 lb (79.4 kg)  Height: 5' 2.6" (1.59 m)   Body mass index is 31.4 kg/m.  Physical Exam Vitals reviewed. Exam conducted with a chaperone present.  Constitutional:      General: She is not in acute distress.    Appearance: Normal appearance.  HENT:     Head: Normocephalic and atraumatic.     Nose: Nose normal.  Eyes:     Extraocular Movements: Extraocular movements intact.     Conjunctiva/sclera: Conjunctivae normal.  Neck:     Thyroid: No  thyroid mass, thyromegaly or thyroid tenderness.  Pulmonary:     Effort: Pulmonary effort is normal.  Chest:     Chest wall: No mass or tenderness.  Breasts:    Right: Normal. No swelling, mass, nipple discharge or tenderness.     Left: Normal. No  swelling, mass, nipple discharge or tenderness.  Abdominal:     General: There is no distension.     Palpations: Abdomen is soft.     Tenderness: There is no abdominal tenderness.  Genitourinary:    General: Normal vulva.     Exam position: Lithotomy position.     Urethra: No prolapse.     Vagina: Normal. No vaginal discharge or bleeding.     Cervix: No lesion.     Adnexa: Right adnexa normal and left adnexa normal.     Comments: Cervix and uterus absent Musculoskeletal:        General: Normal range of motion.     Cervical back: Normal range of motion.  Lymphadenopathy:     Upper Body:     Right upper body: No axillary adenopathy.     Left upper body: No axillary adenopathy.     Lower Body: No right inguinal adenopathy. No left inguinal adenopathy.  Skin:    General: Skin is warm and dry.  Neurological:     General: No focal deficit present.     Mental Status: She is alert.  Psychiatric:        Mood and Affect: Mood normal.        Behavior: Behavior normal.       Assessment and Plan:        Well woman exam with routine gynecological exam Assessment & Plan: Cervical cancer screening N/A, s/p benign hysterectomy Encouraged annual mammogram screening, however patient inquiring about frequency of mammograms. Reviewed biannual screening in recommended by some organizations in patient with low breast cancer risk. Colonoscopy UTD Labs and immunizations with her primary Encouraged safe sexual practices as indicated Encouraged healthy lifestyle practices with diet and exercise For patients under 50yo, I recommend 1000mg  calcium daily and 600IU of vitamin D daily.    Weak urinary stream -     Urinalysis,Complete w/RFL Culture  Recommend kegels  Rosalyn Gess, MD

## 2023-04-16 NOTE — Assessment & Plan Note (Signed)
Cervical cancer screening N/A, s/p benign hysterectomy Encouraged annual mammogram screening, however patient inquiring about frequency of mammograms. Reviewed biannual screening in recommended by some organizations in patient with low breast cancer risk. Colonoscopy UTD Labs and immunizations with her primary Encouraged safe sexual practices as indicated Encouraged healthy lifestyle practices with diet and exercise For patients under 50yo, I recommend 1000mg  calcium daily and 600IU of vitamin D daily.

## 2023-04-16 NOTE — Patient Instructions (Signed)

## 2023-04-18 ENCOUNTER — Encounter: Payer: Self-pay | Admitting: Obstetrics and Gynecology

## 2023-04-18 LAB — URINE CULTURE
MICRO NUMBER:: 16092476
SPECIMEN QUALITY:: ADEQUATE

## 2023-04-18 LAB — URINALYSIS, COMPLETE W/RFL CULTURE
Glucose, UA: NEGATIVE
Hyaline Cast: NONE SEEN /LPF
Leukocyte Esterase: NEGATIVE
Nitrites, Initial: NEGATIVE
Specific Gravity, Urine: 1.025 (ref 1.001–1.035)
pH: 6 (ref 5.0–8.0)

## 2023-04-18 LAB — CULTURE INDICATED

## 2023-06-22 ENCOUNTER — Encounter: Payer: Self-pay | Admitting: Neurology

## 2023-06-24 ENCOUNTER — Emergency Department (HOSPITAL_BASED_OUTPATIENT_CLINIC_OR_DEPARTMENT_OTHER): Admitting: Radiology

## 2023-06-24 ENCOUNTER — Encounter (HOSPITAL_BASED_OUTPATIENT_CLINIC_OR_DEPARTMENT_OTHER): Payer: Self-pay

## 2023-06-24 ENCOUNTER — Other Ambulatory Visit: Payer: Self-pay

## 2023-06-24 ENCOUNTER — Emergency Department (HOSPITAL_BASED_OUTPATIENT_CLINIC_OR_DEPARTMENT_OTHER)
Admission: EM | Admit: 2023-06-24 | Discharge: 2023-06-25 | Disposition: A | Discharge status: Home / Self Care | Attending: Emergency Medicine | Admitting: Emergency Medicine

## 2023-06-24 ENCOUNTER — Emergency Department (HOSPITAL_BASED_OUTPATIENT_CLINIC_OR_DEPARTMENT_OTHER)

## 2023-06-24 DIAGNOSIS — R519 Headache, unspecified: Secondary | ICD-10-CM | POA: Insufficient documentation

## 2023-06-24 DIAGNOSIS — Z85828 Personal history of other malignant neoplasm of skin: Secondary | ICD-10-CM | POA: Insufficient documentation

## 2023-06-24 DIAGNOSIS — I10 Essential (primary) hypertension: Secondary | ICD-10-CM | POA: Diagnosis not present

## 2023-06-24 DIAGNOSIS — F17210 Nicotine dependence, cigarettes, uncomplicated: Secondary | ICD-10-CM | POA: Diagnosis not present

## 2023-06-24 DIAGNOSIS — J45909 Unspecified asthma, uncomplicated: Secondary | ICD-10-CM | POA: Insufficient documentation

## 2023-06-24 MED ORDER — DIPHENHYDRAMINE HCL 50 MG/ML IJ SOLN
25.0000 mg | Freq: Once | INTRAMUSCULAR | Status: AC
  Administered 2023-06-25: 25 mg via INTRAVENOUS
  Filled 2023-06-24: qty 1

## 2023-06-24 MED ORDER — KETOROLAC TROMETHAMINE 15 MG/ML IJ SOLN
15.0000 mg | Freq: Once | INTRAMUSCULAR | Status: AC
  Administered 2023-06-25: 15 mg via INTRAVENOUS
  Filled 2023-06-24: qty 1

## 2023-06-24 MED ORDER — PROCHLORPERAZINE EDISYLATE 10 MG/2ML IJ SOLN
10.0000 mg | Freq: Once | INTRAMUSCULAR | Status: AC
  Administered 2023-06-25: 10 mg via INTRAVENOUS
  Filled 2023-06-24: qty 2

## 2023-06-24 MED ORDER — SODIUM CHLORIDE 0.9 % IV BOLUS
1000.0000 mL | Freq: Once | INTRAVENOUS | Status: AC
  Administered 2023-06-25: 1000 mL via INTRAVENOUS

## 2023-06-24 NOTE — ED Triage Notes (Signed)
 Pt reports that she has had head pressure x 2 days. Pt had shunt placed for chiari malformation in  and pt states that she feels like something is wrong with her shunt. Pt also reports that her balance "is off".

## 2023-06-25 NOTE — Discharge Instructions (Signed)
You were evaluated in the Emergency Department and after careful evaluation, we did not find any emergent condition requiring admission or further testing in the hospital.  Your exam/testing today is overall reassuring.  Recommend close follow-up with your regular doctors to discuss your symptoms.  Please return to the Emergency Department if you experience any worsening of your condition.   Thank you for allowing us to be a part of your care. 

## 2023-06-25 NOTE — ED Provider Notes (Signed)
 [facility] Emergency Department Provider Note MRN:  [id number]  Arrival date & time: 06/25/23     Chief Complaint   Headache   History of Present Illness   Cassidy Tran is a 48 y.o. year-old female with a history of VP shunt presenting to the ED with chief complaint of headache.  Patient worried that her shunt is malfunctioning.  Mild headache starting 3 days ago, becoming worse and worse especially today.  Worse with bright lights and loud noises, feels like a pressure in the back of the head radiating to the front of the head.  Denies numbness or weakness to the arms or legs, having some trouble balancing today during the worst of the pain.  Review of Systems  A thorough review of systems was obtained and all systems are negative except as noted in the HPI and PMH.   Patient's Health History    Past Medical History:  Diagnosis Date   Acid reflux    Allergy    Anxiety    Arthritis    lower back   ASCUS (atypical squamous cells of undetermined significance) on Pap smear 03/2011   NEG HR HPV   Asthma    Cancer (HCC)    skin cancer- age 18ish   Cervical dysplasia, mild 08/2010   LGSIL colposcopy biopsy showing koilocytotic atypia   Clotting disorder (HCC)    Depression    Endometriosis    Headache(784.0)    High risk HPV infection 12/2011   Pap normal   Hypertension    Pneumonia    2016ish   Seizures (HCC)    seizure disorder   Smoker    Von Willebrand disease (HCC)    Well woman exam with routine gynecological exam 04/16/2023    Past Surgical History:  Procedure Laterality Date   APPLICATION OF CRANIAL NAVIGATION N/A 11/24/2016   Procedure: APPLICATION OF CRANIAL NAVIGATION;  Surgeon: Elna Haggis, MD;  [facility];  Service: Neurosurgery;  Laterality: N/A;   BRAIN SURGERY     BREAST BIOPSY Left 02/2016   CHOLECYSTECTOMY N/A 05/05/2021   Procedure: CHOLECYSTECTOMY;  Surgeon: Anda Bamberg, MD;  [facility];  Service:  General;  Laterality: N/A;   COLON RESECTION N/A 05/05/2021   Procedure: RIGHT HEMICOLECTOMY;  Surgeon: Anda Bamberg, MD;  [facility];  Service: General;  Laterality: N/A;   COLPOSCOPY     CYSTOSCOPY N/A 10/14/2012   Procedure: CYSTOSCOPY;  Surgeon: Lacretia Piccolo, MD;  Location[facility];  Service: Gynecology;  Laterality: N/A;   DILATION AND CURETTAGE OF UTERUS     HYSTEROSCOPY WITH D & C  10/14/2010   Procedure: DILATATION AND CURETTAGE (D&C) /HYSTEROSCOPY;  Surgeon: Lacretia Piccolo, MD;  [facility]: [facility];  Service: Gynecology;  Laterality: N/A;   LAPAROSCOPIC HYSTERECTOMY N/A 10/14/2012   Procedure: HYSTERECTOMY TOTAL LAPAROSCOPIC;  Surgeon: Lacretia Piccolo, MD;  [facility];  Service: Gynecology;  Laterality: N/A;  CPT [id number]  2 1/2 hours  Dr. Shila Door to assist.   LAPAROSCOPY  04/27/2011   Procedure: LAPAROSCOPY OPERATIVE;  Surgeon: Lacretia Piccolo, MD;  Location: [facility];  Service: Gynecology;  Laterality: N/A;  removal right cyst , lysis of adhesions, biopsy of peritoneum   LAPAROTOMY N/A 05/05/2021   Procedure: EXPLORATORY LAPAROTOMY;  Surgeon: Anda Bamberg, MD;  [facility];  Service: General;  Laterality: N/A;   NASAL SINUS SURGERY  1993   IN [place], [place]   PEG PLACEMENT N/A 05/30/2021  Procedure: PERCUTANEOUS ENDOSCOPIC GASTROSTOMY (PEG) PLACEMENT;  Surgeon: Anda Bamberg, MD;  [facility];  Service: General;  Laterality: N/A;   SUBOCCIPITAL CRANIECTOMY CERVICAL LAMINECTOMY N/A 11/06/2016   Procedure: Suboccipital decompression for chiari malformation;  Surgeon: Elna Haggis, MD;  Location: [facility] OR;  Service: Neurosurgery;  Laterality: N/A;   TONSILLECTOMY     TRACHEOSTOMY TUBE PLACEMENT N/A 05/30/2021   Procedure: TRACHEOSTOMY;  Surgeon: Anda Bamberg, MD;  [facility];  Service: General;  Laterality: N/A;   VENTRICULOPERITONEAL SHUNT N/A 11/24/2016   Procedure: SHUNT INSERTION VENTRICULAR-PERITONEAL with Brainlab;  Surgeon:  Elna Haggis, MD;  [facility];  Service: Neurosurgery;  Laterality: N/A;  right side approach   WISDOM TOOTH EXTRACTION  1993    Family History  Problem Relation Age of Onset   Hypertension Mother    Heart disease Mother    Hyperlipidemia Mother    Diabetes Father    Hypertension Father    Heart disease Father    Hyperlipidemia Father    Stroke Father    Prostate cancer Father    Lymphoma Sister        NON-HODGKINS LYMPHOMA   Cancer Maternal Uncle        Lung and pancreatic cancer   Colon cancer Neg Hx    Rectal cancer Neg Hx    Esophageal cancer Neg Hx     Social History   Socioeconomic History   Marital status: Married    Spouse name: Not on file   Number of children: Not on file   Years of education: Not on file   Highest education level: Not on file  Occupational History   Occupation: Customer Service  Tobacco Use   Smoking status: Some Days    Current packs/day: 0.00    Average packs/day: 0.5 packs/day for 20.0 years (10.0 ttl pk-yrs)    Types: Cigarettes    Start date: 05/05/2001    Last attempt to quit: 05/05/2021    Years since quitting: 2.1   Smokeless tobacco: Never  Vaping Use   Vaping status: Never Used  Substance and Sexual Activity   Alcohol use: Not Currently    Comment: quit since March   Drug use: No   Sexual activity: Yes    Birth control/protection: Surgical    Comment: Hyst, First IC @ 89 y/o, >5 Partners  Other Topics Concern   Not on file  Social History Narrative   Right handed    Live with husband and dog in a one level home.    Caffeine  rarely   Social Drivers of Corporate investment banker Strain: Not on file  Food Insecurity: No Food Insecurity (06/26/2022)   Hunger Vital Sign    Worried About Running Out of Food in the Last Year: Never true    Ran Out of Food in the Last Year: Never true  Transportation Needs: No Transportation Needs (06/26/2022)   PRAPARE - Administrator, Civil Service (Medical): No    Lack of  Transportation (Non-Medical): No  Physical Activity: Not on file  Stress: Not on file  Social Connections: Not on file  Intimate Partner Violence: Not At Risk (06/26/2022)   Humiliation, Afraid, Rape, and Kick questionnaire    Fear of Current or Ex-Partner: No    Emotionally Abused: No    Physically Abused: No    Sexually Abused: No     Physical Exam   Vitals:   06/24/23 2305 06/25/23 0030  BP: (!) 179/95 Aaron Aas)  140/63  Pulse: (!) 110 90  Resp: 16 16  Temp: 97.6 F (36.4 C)   SpO2: 96% 95%    CONSTITUTIONAL: Well-appearing, NAD NEURO/PSYCH:  Alert and oriented x 3, normal and symmetric strength and sensation, normal coordination, normal speech EYES:  eyes equal and reactive ENT/NECK:  no LAD, no JVD CARDIO: Regular rate, well-perfused, normal S1 and S2 PULM:  CTAB no wheezing or rhonchi GI/GU:  non-distended, non-tender MSK/SPINE:  No gross deformities, no edema SKIN:  no rash, atraumatic   *Additional and/or pertinent findings included in MDM below  Diagnostic and Interventional Summary    EKG Interpretation Date/Time:    Ventricular Rate:    PR Interval:    QRS Duration:    QT Interval:    QTC Calculation:   R Axis:      Text Interpretation:         Labs Reviewed - No data to display  DG Cervical Spine 1 View  Final Result    DG Chest 1 View  Final Result    DG Abd 1 View  Final Result    CT HEAD WO CONTRAST ( )  Final Result      Medications  prochlorperazine  (COMPAZINE ) injection 10 mg (10 mg Intravenous Given 06/25/23 0024)  diphenhydrAMINE  (BENADRYL ) injection 25 mg (25 mg Intravenous Given 06/25/23 0021)  sodium chloride  0.9 % bolus 1,000 mL (1,000 mLs Intravenous New Bag/Given 06/25/23 0015)  ketorolac  (TORADOL ) 15 MG/ML injection 15 mg (15 mg Intravenous Given 06/25/23 0018)     Procedures  /  Critical Care Procedures  ED Course and Medical Decision Making  Initial Impression and Ddx Patient looks uncomfortable, holding the emesis bag,  eyes are closed because light causes a worsening of her headache.  Sounds like she has a long history of headaches and so this could simply be due to primary headache disorder such as migraine.  Given the VP shunt history shunt malfunction and hydrocephalus is of course a consideration so patient needs imaging.  Per chart review patient has von Willebrand disease and a clotting disorder.  And so central venous thrombus is a less likely but still present consideration.  Past medical/surgical history that increases complexity of ED encounter: VP shunt, Chiari I malformation  Interpretation of Diagnostics I personally reviewed the CT head and my interpretation is as follows: No obvious hydrocephalus  Radiology interpretation of the CT head as well as the plain films are reassuring, no significant changes or obvious kinking of the VP shunt.  Patient Reassessment and Ultimate Disposition/Management     Upon reassessment after migraine cocktail patient is feeling much better.  We discussed the pros and cons of CT scan with contrast to evaluate for dural venous thrombus.  Patient does not have any oppositionality to her pain, it feels like headaches she has had in the past.  She overall feels so much better and would like to go home and get some sleep and follow-up with her regular doctors.  Overall I have a very low concern for dural venous thrombus and the description of the pain seems much more migrainous and so this seems reasonable after shared decision making to hold off on further advanced imaging and have patient follow-up with her regular doctors, strict return precautions.  Patient management required discussion with the following services or consulting groups:  None  Complexity of Problems Addressed Acute illness or injury that poses threat of life of bodily function  Additional Data Reviewed and Analyzed Further history obtained from:  Further history from spouse/family member  Additional  Factors Impacting ED Encounter Risk Consideration of hospitalization  Merrick Abe. Harless Lien, MD [facility] Emergency Medicine [facility] [email or web address]  Final Clinical Impressions(s) / ED Diagnoses     ICD-10-CM   1. Acute nonintractable headache, unspecified headache type  R51.9       ED Discharge Orders     None        Discharge Instructions Discussed with and Provided to Patient:     Discharge Instructions      You were evaluated in the Emergency Department and after careful evaluation, we did not find any emergent condition requiring admission or further testing in the hospital.  Your exam/testing today is overall reassuring.  Recommend close follow-up with your regular doctors to discuss your symptoms.  Please return to the Emergency Department if you experience any worsening of your condition.   Thank you for allowing us  to be a part of your care.       Edson Graces, MD 06/25/23 [phone]

## 2023-06-29 ENCOUNTER — Inpatient Hospital Stay: Payer: No Typology Code available for payment source | Attending: Oncology | Admitting: Oncology

## 2023-06-29 ENCOUNTER — Inpatient Hospital Stay: Payer: Self-pay

## 2023-06-29 ENCOUNTER — Telehealth: Payer: Self-pay | Admitting: Oncology

## 2023-06-29 ENCOUNTER — Other Ambulatory Visit: Payer: No Typology Code available for payment source

## 2023-06-29 VITALS — BP 160/90 | HR 100 | Temp 98.1°F | Resp 18 | Ht 62.6 in | Wt 177.4 lb

## 2023-06-29 DIAGNOSIS — C7A8 Other malignant neuroendocrine tumors: Secondary | ICD-10-CM

## 2023-06-29 DIAGNOSIS — F17200 Nicotine dependence, unspecified, uncomplicated: Secondary | ICD-10-CM | POA: Insufficient documentation

## 2023-06-29 DIAGNOSIS — D3A012 Benign carcinoid tumor of the ileum: Secondary | ICD-10-CM | POA: Insufficient documentation

## 2023-06-29 DIAGNOSIS — K529 Noninfective gastroenteritis and colitis, unspecified: Secondary | ICD-10-CM | POA: Diagnosis not present

## 2023-06-29 DIAGNOSIS — D72829 Elevated white blood cell count, unspecified: Secondary | ICD-10-CM | POA: Diagnosis not present

## 2023-06-29 LAB — CBC WITH DIFFERENTIAL (CANCER CENTER ONLY)
Abs Immature Granulocytes: 0.04 10*3/uL (ref 0.00–0.07)
Basophils Absolute: 0 10*3/uL (ref 0.0–0.1)
Basophils Relative: 0 %
Eosinophils Absolute: 0.1 10*3/uL (ref 0.0–0.5)
Eosinophils Relative: 1 %
HCT: 42.6 % (ref 36.0–46.0)
Hemoglobin: 14.1 g/dL (ref 12.0–15.0)
Immature Granulocytes: 0 %
Lymphocytes Relative: 33 %
Lymphs Abs: 3.8 10*3/uL (ref 0.7–4.0)
MCH: 29.3 pg (ref 26.0–34.0)
MCHC: 33.1 g/dL (ref 30.0–36.0)
MCV: 88.6 fL (ref 80.0–100.0)
Monocytes Absolute: 0.5 10*3/uL (ref 0.1–1.0)
Monocytes Relative: 4 %
Neutro Abs: 7.1 10*3/uL (ref 1.7–7.7)
Neutrophils Relative %: 62 %
Platelet Count: 370 10*3/uL (ref 150–400)
RBC: 4.81 MIL/uL (ref 3.87–5.11)
RDW: 14.6 % (ref 11.5–15.5)
WBC Count: 11.6 10*3/uL — ABNORMAL HIGH (ref 4.0–10.5)
nRBC: 0 % (ref 0.0–0.2)

## 2023-06-29 NOTE — Telephone Encounter (Signed)
 Patient has been scheduled for follow-up visit per 06/29/23 LOS.  Pt aware of scheduled appt details.

## 2023-06-29 NOTE — Progress Notes (Signed)
  Cassidy Tran OFFICE PROGRESS NOTE   Diagnosis: Carcinoid tumor  INTERVAL HISTORY:   Cassidy Tran returns as scheduled.  She continues to have multiple bowel movements daily.  The stool is loose.  Colestid  sometimes helps.  She reports no relief with Imodium  and Lomotil .  She continues follow-up with Dr. Karene Tran.  She has occasional rectal bleeding. She is working.  Good appetite.  She continues smoking.  She has symptoms of asthma.  Objective:  Vital signs in last 24 hours:  Blood pressure (!) 160/90, pulse 100, temperature 98.1 F (36.7 C), temperature source Temporal, resp. rate 18, height 5' 2.6" (1.59 m), weight 177 lb 6.4 oz (80.5 kg), last menstrual period 09/16/2012, SpO2 98%.   Lymphatics: No cervical, supraclavicular, axillary, or inguinal nodes Resp: Lungs with scattered bilateral end inspiratory and expiratory rhonchi/wheeze, no respiratory distress Cardio: Regular rate and rhythm GI: No hepatosplenomegaly, no mass, nontender Vascular: No leg edema  Lab Results:  Lab Results  Component Value Date   WBC 11.6 (H) 06/29/2023   HGB 14.1 06/29/2023   HCT 42.6 06/29/2023   MCV 88.6 06/29/2023   PLT 370 06/29/2023   NEUTROABS 7.1 06/29/2023    CMP  Lab Results  Component Value Date   NA 141 06/15/2022   K 3.7 06/15/2022   CL 104 06/15/2022   CO2 27 06/15/2022   GLUCOSE 93 06/15/2022   BUN 12 06/15/2022   CREATININE 0.73 06/15/2022   CALCIUM  9.1 06/15/2022   PROT 7.7 05/30/2022   ALBUMIN 4.4 05/30/2022   AST 13 (L) 05/30/2022   ALT 17 05/30/2022   ALKPHOS 124 05/30/2022   BILITOT 0.7 05/30/2022   GFRNONAA >60 05/30/2022   GFRAA >60 11/24/2016    Medications: I have reviewed the patient's current medications.   Assessment/Plan: Carcinoid tumor of distal ileum, status post a right colectomy 05/05/2021 Colonoscopy 03/11/2021-polypoid lesion in the terminal ileum-well-differentiated neuroendocrine tumor Stage III, pT2 pN1, 1/7 nodes, grade 1,  Ki-67 less than 3%, no lymphovascular invasion, negative margins Dotatate PET 08/17/2021-no evidence of residual neuroendocrine tumor Lanreotide 08/23/2021-discontinued after 1 dose Normal 24-hour urine 5 HIAA 06/19/2022 Diarrhea predating and following the right colectomy and small bowel resection Improved with colestipol  April 2024 Chiari malformation, status post VP shunt 11/24/2016 Seizure disorder Neuropathy in the lower legs and feet following abdominal surgery March 2023 History of migraine headaches Von Willebrand's disease 8.   Asthma 9.  Hypertension 10.  Respiratory failure March 2023, intubated 05/12/2021, tracheostomy 05/30/2021, off ventilator 05/31/2021 11.  C. difficile colitis 12/16/2020 12.  Leukocytosis-likely secondary to ongoing tobacco use       Disposition: Cassidy Tran diagnosed with a stage III carcinoid tumor of the terminal ileum in 2023.  She is in clinical remission.  She has chronic diarrhea.  The diarrhea predated the colon surgery and has not changed.  She is followed by Dr. Karene Tran.  She reports a family history of a neuroendocrine tumor in her sister and in a paternal cousin.  She will be referred to the genetics counselor.  We will follow-up on the chromogranin A level from today.  She will return for an office visit and chromogranin A in 8 months.  She has chronic mild leukocytosis, likely related to tobacco use.  I recommended she had the potassium and magnesium  checked via Dr. Karene Tran or her primary provider.  Cassidy Deep, MD  06/29/2023  12:25 PM

## 2023-07-01 LAB — CHROMOGRANIN A: Chromogranin A (ng/mL): 65.6 ng/mL (ref 0.0–101.8)

## 2023-07-02 ENCOUNTER — Telehealth: Payer: Self-pay

## 2023-07-02 NOTE — Telephone Encounter (Signed)
-----   Message from Cassidy Tran sent at 07/02/2023  7:45 AM EDT ----- Please call patient, the chromogranin A level is normal, slightly higher in the normal range, likely a benign finding, follow-up as scheduled

## 2023-07-02 NOTE — Telephone Encounter (Signed)
 Patient gave verbal understanding and had no further questions or concerns

## 2023-07-06 ENCOUNTER — Encounter: Payer: Self-pay | Admitting: Neurology

## 2023-07-06 ENCOUNTER — Ambulatory Visit: Admitting: Neurology

## 2023-07-06 VITALS — BP 154/81 | HR 100 | Ht 63.0 in | Wt 180.6 lb

## 2023-07-06 DIAGNOSIS — G40309 Generalized idiopathic epilepsy and epileptic syndromes, not intractable, without status epilepticus: Secondary | ICD-10-CM

## 2023-07-06 DIAGNOSIS — G43009 Migraine without aura, not intractable, without status migrainosus: Secondary | ICD-10-CM | POA: Diagnosis not present

## 2023-07-06 MED ORDER — SUMATRIPTAN SUCCINATE 25 MG PO TABS: 25.0000 mg | ORAL_TABLET | ORAL | 6 refills | Status: DC | PRN

## 2023-07-06 MED ORDER — AIMOVIG 140 MG/ML ~~LOC~~ SOAJ: 1.0000 mL | SUBCUTANEOUS | 11 refills | Status: DC

## 2023-07-06 MED ORDER — LEVETIRACETAM 750 MG PO TABS: 750.0000 mg | ORAL_TABLET | Freq: Every day | ORAL | 3 refills | Status: DC

## 2023-07-06 MED ORDER — GABAPENTIN 100 MG PO CAPS: ORAL_CAPSULE | ORAL | 3 refills | Status: AC

## 2023-07-06 MED ORDER — ZONISAMIDE 100 MG PO CAPS: ORAL_CAPSULE | ORAL | 3 refills | Status: DC

## 2023-07-06 NOTE — Progress Notes (Signed)
 NEUROLOGY FOLLOW UP OFFICE NOTE  Cassidy Tran [id number] November 09, 1975  HISTORY OF PRESENT ILLNESS: I had the pleasure of seeing Cassidy Tran in follow-up in the neurology clinic on 07/06/2023.  The patient was last seen on in October 2023 for JME and neuropathy. She is alone in the office today. Records and images were personally reviewed where available.  She denies any seizures since 2011 on Zonisamide  100mg  at bedtime and Levetiracetam  750mg  at bedtime. She denies any staring/unresponsive episodes, gaps in time, olfactory/gustatory hallucinations, focal numbness/tingling/weakness. She has occasional myoclonic jerks when she does not get enough sleep and takes an extra Levetiracetam . She was in the ER on 06/24/23 for migraine. Head CT no acute changes, right parietal ventriculostomy with tip at left frontal lobe, progressive decompression of ventricles with near complete decompression of left lateral third ventricles. She reports migraines at least twice a week with pain behind her eyes. There is nausea, photo and phonophobia. She takes Tylenol  which does not help. She has Gabapentin  for neuropathy but only takes it as needed around once a month when her feet ar bothering her. No further foot drop. Neuropathy mostly affects the dorsum of both feet. No falls. Mood is today is "irritated" because of scheduling changes in our office. She gets 4.5 hours of sleep, needing to use the bathroom frequently.    History on Initial Assessment 08/02/2021: This is a pleasant 48 year old right-handed woman with a history of von Willebrand disease, neuroendocrine tumor, Juvenile Myoclonic Epilepsy since age 39, presenting to establish care for seizures. Records were reviewed. Seizures are usually preceded by myoclonic jerks. She reports longest seizure-free interval was 25 years, then she had a GTC in 2009, 2010, and 2011. She and her husband deny any convulsions since 2011. She was being followed at [facility], last visit was in 2017. At that time she was on Zonisamide  100mg  BID and Depakote  ER 2000mg  qhs. She has not seen a neurologist since then. She started having diarrhea in 08/2020 and was found to have biopsy-proven neuroendocrine tumor of the terminal ileum. She underwent GI surgery (exlap, R hemicolectomy, cholecystectomy) in 05/05/21, course was complicated by respiratory failure requiring intubation and trach. She had altered mental status with hyperammonemia (104) felt to be due to Depakote . She was seen by Neurology during her admission and Depakote  was stopped. She was on IV Levetiracetam  and had continuous EEG monitoring for 48 hours with diffuse background slowing, no epileptiform discharges or electrographic seizures. She was discharged on Levetiracetam  750mg  BID and Zonisamide  100mg  BID, however states that the LEV was stopped and that she has only been taking Zonisamide  100mg  qhs. She and her husband deny any staring/unresponsive episodes, gaps in time, olfactory/gustatory hallucinations. She mostly has myoclonic jerks if she forgets to take her medication or when sleep deprived. She could not remember how she was taking the Zonisamide  prior to hospitalization.  She was in inpatient rehab after hospitalization from 4/9 to 06/25/21. She reports that she was dropped by a nurse tech and fell on her feet, and since then she has had bilateral foot drop R>L and constant burning/tingling on the dorsum of both feet and below her knees. She reported right foot pain and had an xray showing a subacute right 5th metatarsal fracture. She reports that Gabapentin  100mg  TID was started however she ran out of medication, it does help with the paresthesias. She ambulates with a walker. She denies any symptoms of neuropathy prior to her hospitalization. She has also  been having numbness in the last 2 fingers on her right hand since hospitalization. She has tightness in her neck and back. She continues to deal with  frequent diarrhea, no urinary issues. The diarrhea is causing a lot of anxiety, keeping her up at night due to frequent BMs. She has a history of VP shunt secondary to Chiari malformation, she was seen by Neurosurgery during her admission and had a lumbar puncture with normal CSF. She continues to follow-up with Neurosurgery for the shunt.   Epilepsy Risk Factors:  She had a normal birth and early development.  There is no history of febrile convulsions, CNS infections such as meningitis/encephalitis, significant traumatic brain injury, neurosurgical procedures, or family history of seizures.  Prior ASMs: Topiramate, Depakote  (hyperammonemia) Prior headache preventative medications: amitriptyline , Topiramate, Depakote   Diagnostic Data: EEGs: EEG in 2011 reported intermittent 4-5 Hz spike and polyspike and wave complexes MRI: MRI brain in 2015 no acute changes, hippocampi symmetric. At that time, note of cerebellar tonsillar ectopia measuring 4mm.  Head CT without contrast 05/2021 VP shunt in the occipital region on the right terminates in the frontal lobe on left, ventricles stable, no hydrocephalus. Prior Chiari decompression with stable appearance of foramen magnum   PAST MEDICAL HISTORY: Past Medical History:  Diagnosis Date   Acid reflux    Allergy    Anxiety    Arthritis    lower back   ASCUS (atypical squamous cells of undetermined significance) on Pap smear 03/2011   NEG HR HPV   Asthma    Cancer (HCC)    skin cancer- age 18ish   Cervical dysplasia, mild 08/2010   LGSIL colposcopy biopsy showing koilocytotic atypia   Clotting disorder (HCC)    Depression    Endometriosis    Headache(784.0)    High risk HPV infection 12/2011   Pap normal   Hypertension    Pneumonia    2016ish   Seizures (HCC)    seizure disorder   Smoker    Von Willebrand disease (HCC)    Well woman exam with routine gynecological exam 04/16/2023    MEDICATIONS: Current Outpatient Medications on File  Prior to Visit  Medication Sig Dispense Refill   acetaminophen  (TYLENOL ) 325 MG tablet Take 1-2 tablets (325-650 mg total) by mouth every 4 (four) hours as needed for mild pain.     budesonide  (PULMICORT ) 0.5 MG/2ML nebulizer solution Take 1 mL (0.25 mg total) by nebulization 2 (two) times daily. 60 mL 0   carvedilol  (COREG ) 3.125 MG tablet Take 1 tablet (3.125 mg total) by mouth 2 (two) times daily with a meal. (Patient taking differently: Take 3.125 mg by mouth 2 (two) times daily with a meal. Taking 1 tablet at night) 60 tablet 0   colestipol  (COLESTID ) 1 g tablet Take 2 tablets (2 g total) by mouth daily. 180 tablet 3   diphenoxylate -atropine  (LOMOTIL ) 2.5-0.025 MG tablet Take 2 tablets by mouth 4 (four) times daily. 30 tablet 0   doxepin (SINEQUAN) 25 MG capsule Take 25 mg by mouth at bedtime.     gabapentin  (NEURONTIN ) 100 MG capsule TAKE 1 CAPSULE(100 MG) BY MOUTH THREE TIMES DAILY 270 capsule 0   levETIRAcetam  (KEPPRA ) 750 MG tablet TAKE 1 TABLET BY MOUTH EVERY NIGHT 90 tablet 0   loperamide  (IMODIUM  A-D) 2 MG tablet Take 2 mg by mouth 4 (four) times daily as needed for diarrhea or loose stools.     Magnesium  250 MG TABS Take 250 mg by mouth at bedtime.  mometasone  (NASONEX ) 50 MCG/ACT nasal spray Use one to two sprays in each nostril once daily 17 g 5   potassium chloride  SA (KLOR-CON  M) 20 MEQ tablet TAKE 2 TABLETS(40 MEQ) BY MOUTH TWICE DAILY FOR 7 DAYS THEN TAKE 1 TABLET BY MOUTH TWICE DAILY 120 tablet 2   Tiotropium Bromide -Olodaterol (STIOLTO RESPIMAT ) 2.5-2.5 MCG/ACT AERS Inhale 2 puffs into the lungs daily. 4 g 0   zonisamide  (ZONEGRAN ) 100 MG capsule TAKE 1 CAPSULE BY MOUTH EVERY NIGHT 90 capsule 0   Current Facility-Administered Medications on File Prior to Visit  Medication Dose Route Frequency Provider Last Rate Last Admin   lanreotide acetate  (SOMATULINE DEPOT ) injection 120 mg  120 mg Subcutaneous Once Ennever, Sherryll Donald, MD        ALLERGIES: Allergies  Allergen  Reactions   Aspirin Other (See Comments)    Childhood reaction   Codeine Nausea And Vomiting    Tolerates Hydrocodone    Erythromycin Other (See Comments)    Childhood reaction, tolerate Zpak   Lactose Intolerance (Gi) Diarrhea   Nsaids Nausea And Vomiting   Penicillins Other (See Comments)    Has patient had a PCN reaction causing immediate rash, facial/tongue/throat swelling, SOB or lightheadedness with hypotension: doesn't remember childhood Reaction  Has patient had a PCN reaction causing severe rash involving mucus membranes or skin necrosis: NO Has patient had a PCN reaction that required hospitalization NO Has patient had a PCN reaction occurring within the last 10 years: NO If all of the above answers are "NO", then may proceed with Cephalosporin use.     FAMILY HISTORY: Family History  Problem Relation Age of Onset   Hypertension Mother    Heart disease Mother    Hyperlipidemia Mother    Diabetes Father    Hypertension Father    Heart disease Father    Hyperlipidemia Father    Stroke Father    Prostate cancer Father    Lymphoma Sister        NON-HODGKINS LYMPHOMA   Cancer Maternal Uncle        Lung and pancreatic cancer   Colon cancer Neg Hx    Rectal cancer Neg Hx    Esophageal cancer Neg Hx     SOCIAL HISTORY: Social History   Socioeconomic History   Marital status: Married    Spouse name: Not on file   Number of children: Not on file   Years of education: Not on file   Highest education level: Not on file  Occupational History   Occupation: Customer Service  Tobacco Use   Smoking status: Some Days    Current packs/day: 0.00    Average packs/day: 0.5 packs/day for 20.0 years (10.0 ttl pk-yrs)    Types: Cigarettes    Start date: 05/05/2001    Last attempt to quit: 05/05/2021    Years since quitting: 2.1   Smokeless tobacco: Never  Vaping Use   Vaping status: Never Used  Substance and Sexual Activity   Alcohol use: Not Currently    Comment: quit  since March   Drug use: No   Sexual activity: Yes    Birth control/protection: Surgical    Comment: Hyst, First IC @ 72 y/o, >5 Partners  Other Topics Concern   Not on file  Social History Narrative   Right handed    Live with husband and dog in a one level home.    Caffeine  rarely   Social Drivers of Corporate investment banker Strain: Not on file  Food Insecurity: No Food Insecurity (06/26/2022)   Hunger Vital Sign    Worried About Running Out of Food in the Last Year: Never true    Ran Out of Food in the Last Year: Never true  Transportation Needs: No Transportation Needs (06/26/2022)   PRAPARE - Administrator, Civil Service (Medical): No    Lack of Transportation (Non-Medical): No  Physical Activity: Not on file  Stress: Not on file  Social Connections: Not on file  Intimate Partner Violence: Not At Risk (06/26/2022)   Humiliation, Afraid, Rape, and Kick questionnaire    Fear of Current or Ex-Partner: No    Emotionally Abused: No    Physically Abused: No    Sexually Abused: No     PHYSICAL EXAM: Vitals:   07/06/23 0809  BP: (!) 154/81  Pulse: 100  SpO2: 94%   General: No acute distress Head:  Normocephalic/atraumatic Skin/Extremities: No rash, no edema Neurological Exam: alert and awake. No aphasia or dysarthria. Fund of knowledge is appropriate.  Attention and concentration are normal.   Cranial nerves: Pupils equal, round. Extraocular movements intact with no nystagmus. Visual fields full.  No facial asymmetry.  Motor: Bulk and tone normal, muscle strength 5/5 throughout with no pronator drift.   Finger to nose testing intact.  Gait narrow-based and steady, able to tandem walk adequately.  Romberg negative.   IMPRESSION: This is a pleasant 48 yo RH woman with a history of von Willebrand disease, neuroendocrine tumor, with Juvenile Myoclonic Epilepsy since age 40. She has been seizure-free since 2011 on Levetiracetam  750mg  at bedtime and Zonisamide  100mg   at bedtime (morning dose makes her feel off). She is having frequent migraines and has tried several preventative medications. We discussed starting Aimovig  for migraine prophylaxis and sumatriptan  for migraine rescue, side effects discussed. She knows to minimize rescue medication to 3 times a week to avoid rebound headaches. She is aware of [place] driving laws to stop driving after a seizure until 6 months seizure-free. Follow-up in 4 months, call for any changes.    Thank you for allowing me to participate in her care.  Please do not hesitate to call for any questions or concerns.    Rayfield Cairo, M.D.   CC: Dr. Hazeline Lister

## 2023-07-06 NOTE — Patient Instructions (Signed)
 Good to see you.   Start Aimovig once a month injection to help cut down on migraine frequency and intensity. Please keep a calendar of migraines as you start medication  2. A prescription for as needed sumatriptan has also been sent to take at the onset of migraine. Do not take more than 3 a week  3. Continue Zonisamide  100mg  daily and Levetiracetam  750mg  daily  4. Follow-up in 4 months, call for any changes   Seizure Precautions: 1. If medication has been prescribed for you to prevent seizures, take it exactly as directed.  Do not stop taking the medicine without talking to your doctor first, even if you have not had a seizure in a long time.   2. Avoid activities in which a seizure would cause danger to yourself or to others.  Don't operate dangerous machinery, swim alone, or climb in high or dangerous places, such as on ladders, roofs, or girders.  Do not drive unless your doctor says you may.  3. If you have any warning that you may have a seizure, lay down in a safe place where you can't hurt yourself.    4.  No driving for 6 months from last seizure, as per [place]  state law.   Please refer to the following link on the Epilepsy Foundation of America's website for more information: http://www.epilepsyfoundation.org/answerplace/Social/driving/drivingu.cfm   5.  Maintain good sleep hygiene. Avoid alcohol.  6.  Contact your doctor if you have any problems that may be related to the medicine you are taking.  7.  Call 911 and bring the patient back to the ED if:        A.  The seizure lasts longer than 5 minutes.       B.  The patient doesn't awaken shortly after the seizure  C.  The patient has new problems such as difficulty seeing, speaking or moving  D.  The patient was injured during the seizure  E.  The patient has a temperature over 102 F (39C)  F.  The patient vomited and now is having trouble breathing

## 2023-07-07 ENCOUNTER — Other Ambulatory Visit: Payer: Self-pay | Admitting: Gastroenterology

## 2023-07-10 ENCOUNTER — Encounter: Payer: Self-pay | Admitting: Hematology & Oncology

## 2023-07-10 ENCOUNTER — Other Ambulatory Visit (HOSPITAL_COMMUNITY): Payer: Self-pay

## 2023-07-10 ENCOUNTER — Telehealth: Payer: Self-pay | Admitting: Pharmacy Technician

## 2023-07-10 NOTE — Telephone Encounter (Signed)
 Pharmacy Patient Advocate Encounter   Received notification from Fax that prior authorization for AIMOVIG 140MG  is required/requested.   Insurance verification completed.   The patient is insured through [facility] .   Per test claim: PA required; PA submitted to above mentioned insurance via CoverMyMeds Key/confirmation #/EOC [id number] Status is pending

## 2023-07-13 ENCOUNTER — Ambulatory Visit: Payer: 59 | Admitting: Neurology

## 2023-07-23 ENCOUNTER — Encounter: Payer: Self-pay | Admitting: Neurology

## 2023-07-31 NOTE — Telephone Encounter (Signed)
 Approved on May 13 by [employer] 2017 NCPDP Request Reference Number: [id number]. AIMOVIG  INJ 140MG /ML is approved through 01/10/2024. Your patient may now fill this prescription and it will be covered. Effective Date: 07/10/2023 Authorization Expiration Date: 01/10/2024

## 2023-08-24 ENCOUNTER — Inpatient Hospital Stay: Attending: Oncology | Admitting: Genetic Counselor

## 2023-08-24 ENCOUNTER — Ambulatory Visit: Payer: Self-pay | Admitting: Oncology

## 2023-08-24 ENCOUNTER — Inpatient Hospital Stay

## 2023-08-24 DIAGNOSIS — C7A8 Other malignant neuroendocrine tumors: Secondary | ICD-10-CM | POA: Insufficient documentation

## 2023-08-24 LAB — CBC WITH DIFFERENTIAL (CANCER CENTER ONLY)
Abs Immature Granulocytes: 0.04 10*3/uL (ref 0.00–0.07)
Basophils Absolute: 0.1 10*3/uL (ref 0.0–0.1)
Basophils Relative: 0 %
Eosinophils Absolute: 0.1 10*3/uL (ref 0.0–0.5)
Eosinophils Relative: 1 %
HCT: 39 % (ref 36.0–46.0)
Hemoglobin: 13.4 g/dL (ref 12.0–15.0)
Immature Granulocytes: 0 %
Lymphocytes Relative: 35 %
Lymphs Abs: 4.1 10*3/uL — ABNORMAL HIGH (ref 0.7–4.0)
MCH: 29.5 pg (ref 26.0–34.0)
MCHC: 34.4 g/dL (ref 30.0–36.0)
MCV: 85.9 fL (ref 80.0–100.0)
Monocytes Absolute: 0.5 10*3/uL (ref 0.1–1.0)
Monocytes Relative: 4 %
Neutro Abs: 7 10*3/uL (ref 1.7–7.7)
Neutrophils Relative %: 60 %
Platelet Count: 372 10*3/uL (ref 150–400)
RBC: 4.54 MIL/uL (ref 3.87–5.11)
RDW: 14.4 % (ref 11.5–15.5)
Smear Review: NORMAL
WBC Count: 11.7 10*3/uL — ABNORMAL HIGH (ref 4.0–10.5)
nRBC: 0 % (ref 0.0–0.2)

## 2023-08-24 LAB — BASIC METABOLIC PANEL - CANCER CENTER ONLY
Anion gap: 7 (ref 5–15)
BUN: 8 mg/dL (ref 6–20)
CO2: 32 mmol/L (ref 22–32)
Calcium: 8.9 mg/dL (ref 8.9–10.3)
Chloride: 104 mmol/L (ref 98–111)
Creatinine: 0.66 mg/dL (ref 0.44–1.00)
GFR, Estimated: >60 mL/min (ref >60)
Glucose, Bld: 92 mg/dL (ref 70–99)
Potassium: 2.9 mmol/L — ABNORMAL LOW (ref 3.5–5.1)
Sodium: 143 mmol/L (ref 135–145)

## 2023-08-24 LAB — MAGNESIUM: Magnesium: 1.9 mg/dL (ref 1.7–2.4)

## 2023-08-24 NOTE — Telephone Encounter (Signed)
-----   Message from Arley Hof sent at 08/24/2023  2:32 PM EDT ----- Please call patient, the white count is mildly elevated and stable.  The potassium level is low, likely secondary to chronic diarrhea.  Be sure she is taking the potassium as prescribed, increase the  potassium to 3 times daily if she has been taking it twice daily, she should follow-up with Dr. Gerome or Dr. San for ongoing management of diarrhea and hypokalemia, be sure she has a  potassium checked within the next month  ----- Message ----- From: Interface, Lab In [place] Sent: 08/24/2023  12:41 PM EDT To: Arley KATHEE Hof, MD

## 2023-08-24 NOTE — Telephone Encounter (Signed)
 Patient gave verbal understanding and had no further questions or concerns

## 2023-08-27 LAB — CHROMOGRANIN A: Chromogranin A (ng/mL): 44.9 ng/mL (ref 0.0–101.8)

## 2023-09-26 ENCOUNTER — Other Ambulatory Visit: Payer: Self-pay | Admitting: Family Medicine

## 2023-09-26 DIAGNOSIS — Z1231 Encounter for screening mammogram for malignant neoplasm of breast: Secondary | ICD-10-CM

## 2023-10-09 ENCOUNTER — Encounter

## 2023-10-09 DIAGNOSIS — Z1231 Encounter for screening mammogram for malignant neoplasm of breast: Secondary | ICD-10-CM

## 2023-10-15 ENCOUNTER — Encounter: Payer: Self-pay | Admitting: Genetic Counselor

## 2023-10-15 ENCOUNTER — Inpatient Hospital Stay

## 2023-10-15 ENCOUNTER — Ambulatory Visit

## 2023-10-15 ENCOUNTER — Inpatient Hospital Stay: Attending: Oncology | Admitting: Genetic Counselor

## 2023-10-15 DIAGNOSIS — C7A8 Other malignant neuroendocrine tumors: Secondary | ICD-10-CM | POA: Diagnosis not present

## 2023-10-15 DIAGNOSIS — Z801 Family history of malignant neoplasm of trachea, bronchus and lung: Secondary | ICD-10-CM

## 2023-10-15 DIAGNOSIS — Z807 Family history of other malignant neoplasms of lymphoid, hematopoietic and related tissues: Secondary | ICD-10-CM | POA: Diagnosis not present

## 2023-10-15 DIAGNOSIS — Z8042 Family history of malignant neoplasm of prostate: Secondary | ICD-10-CM

## 2023-10-15 DIAGNOSIS — F17210 Nicotine dependence, cigarettes, uncomplicated: Secondary | ICD-10-CM

## 2023-10-15 DIAGNOSIS — Z809 Family history of malignant neoplasm, unspecified: Secondary | ICD-10-CM

## 2023-10-15 LAB — GENETIC SCREENING ORDER

## 2023-10-15 NOTE — Progress Notes (Signed)
 REFERRING PROVIDER: Cloretta Arley NOVAK, MD   PRIMARY PROVIDER:  Gerome Brunet, DO  PRIMARY REASON FOR VISIT:  1. Neuroendocrine carcinoma (HCC)   2. Family history of cancer   3. Family history of malignant neoplasm of prostate     HISTORY OF PRESENT ILLNESS:   [name], a 48 y.o. female, was seen for a [place] cancer genetics consultation at the request of Cloretta Arley NOVAK, MD due to a personal and family history of tumors and cancers.  [name] presents to clinic today to discuss the possibility of a hereditary predisposition to cancer, to discuss genetic testing, and to further clarify her future cancer risks, as well as potential cancer risks for family members.   In 2023, at the age of 25, [name] was diagnosed with neuroendocrine carcinoma of the distal ileum.   RELEVANT MEDICAL HISTORY:  Nulliparous.  Ovaries intact: yes.  Uterus intact: yes.  Colonoscopy: yes; polyp in the rectum and in the terminal ileum. Mammogram within the last year: yes. Number of breast biopsies: 1.  Past Medical History:  Diagnosis Date   Acid reflux    Allergy    Anxiety    Arthritis    lower back   ASCUS (atypical squamous cells of undetermined significance) on Pap smear 03/2011   NEG HR HPV   Asthma    Cancer (HCC)    skin cancer- age 18ish   Cervical dysplasia, mild 08/2010   LGSIL colposcopy biopsy showing koilocytotic atypia   Clotting disorder (HCC)    Depression    Endometriosis    Headache(784.0)    High risk HPV infection 12/2011   Pap normal   Hypertension    Pneumonia    2016ish   Seizures (HCC)    seizure disorder   Smoker    Von Willebrand disease (HCC)    Well woman exam with routine gynecological exam 04/16/2023    Past Surgical History:  Procedure Laterality Date   APPLICATION OF CRANIAL NAVIGATION N/A 11/24/2016   Procedure: APPLICATION OF CRANIAL NAVIGATION;  Surgeon: Colon Shove, MD;  Location: [facility] OR;  Service: Neurosurgery;  Laterality: N/A;   BRAIN  SURGERY     BREAST BIOPSY Left 02/2016   CHOLECYSTECTOMY N/A 05/05/2021   Procedure: CHOLECYSTECTOMY;  Surgeon: Paola Dreama SAILOR, MD;  Location: [facility] OR;  Service: General;  Laterality: N/A;   COLON RESECTION N/A 05/05/2021   Procedure: RIGHT HEMICOLECTOMY;  Surgeon: Paola Dreama SAILOR, MD;  Location: [facility] OR;  Service: General;  Laterality: N/A;   COLPOSCOPY     CYSTOSCOPY N/A 10/14/2012   Procedure: CYSTOSCOPY;  Surgeon: Evalene SHAUNNA Organ, MD;  Location: [facility];  Service: Gynecology;  Laterality: N/A;   DILATION AND CURETTAGE OF UTERUS     HYSTEROSCOPY WITH D & C  10/14/2010   Procedure: DILATATION AND CURETTAGE (D&C) /HYSTEROSCOPY;  Surgeon: Evalene SHAUNNA Organ, MD;  Location: [facility];  Service: Gynecology;  Laterality: N/A;   LAPAROSCOPIC HYSTERECTOMY N/A 10/14/2012   Procedure: HYSTERECTOMY TOTAL LAPAROSCOPIC;  Surgeon: Evalene SHAUNNA Organ, MD;  Location: [facility];  Service: Gynecology;  Laterality: N/A;  CPT [id number]  2 1/2 hours  Dr. Curlee Guan to assist.   LAPAROSCOPY  04/27/2011   Procedure: LAPAROSCOPY OPERATIVE;  Surgeon: Evalene SHAUNNA Organ, MD;  Location: [facility];  Service: Gynecology;  Laterality: N/A;  removal right cyst , lysis of adhesions, biopsy of peritoneum   LAPAROTOMY N/A 05/05/2021   Procedure: EXPLORATORY LAPAROTOMY;  Surgeon: Paola Dreama SAILOR, MD;  Location: [facility]  OR;  Service: General;  Laterality: N/A;   NASAL SINUS SURGERY  1993   IN [place], [place]   PEG PLACEMENT N/A 05/30/2021   Procedure: PERCUTANEOUS ENDOSCOPIC GASTROSTOMY (PEG) PLACEMENT;  Surgeon: Paola Dreama SAILOR, MD;  Location: [facility];  Service: General;  Laterality: N/A;   SUBOCCIPITAL CRANIECTOMY CERVICAL LAMINECTOMY N/A 11/06/2016   Procedure: Suboccipital decompression for chiari malformation;  Surgeon: Colon Shove, MD;  Location: [facility] OR;  Service: Neurosurgery;  Laterality: N/A;   TONSILLECTOMY     TRACHEOSTOMY TUBE PLACEMENT N/A 05/30/2021   Procedure: TRACHEOSTOMY;  Surgeon: Paola Dreama SAILOR, MD;  Location: [facility]  OR;  Service: General;  Laterality: N/A;   VENTRICULOPERITONEAL SHUNT N/A 11/24/2016   Procedure: SHUNT INSERTION VENTRICULAR-PERITONEAL with Brainlab;  Surgeon: Colon Shove, MD;  Location: [facility];  Service: Neurosurgery;  Laterality: N/A;  right side approach   WISDOM TOOTH EXTRACTION  1993    Social History   Socioeconomic History   Marital status: Married    Spouse name: Not on file   Number of children: Not on file   Years of education: Not on file   Highest education level: Not on file  Occupational History   Occupation: Customer Service  Tobacco Use   Smoking status: Some Days    Current packs/day: 0.00    Average packs/day: 0.5 packs/day for 20.0 years (10.0 ttl pk-yrs)    Types: Cigarettes    Start date: 05/05/2001    Last attempt to quit: 05/05/2021    Years since quitting: 2.4   Smokeless tobacco: Never  Vaping Use   Vaping status: Never Used  Substance and Sexual Activity   Alcohol use: Not Currently    Comment: quit since March   Drug use: No   Sexual activity: Yes    Birth control/protection: Surgical    Comment: Hyst, First IC @ 73 y/o, >5 Partners  Other Topics Concern   Not on file  Social History Narrative   Right handed    Live with husband and dog in a one level home.    Caffeine  rarely   Social Drivers of Corporate investment banker Strain: Not on file  Food Insecurity: No Food Insecurity (06/26/2022)   Hunger Vital Sign    Worried About Running Out of Food in the Last Year: Never true    Ran Out of Food in the Last Year: Never true  Transportation Needs: No Transportation Needs (06/26/2022)   PRAPARE - Administrator, Civil Service (Medical): No    Lack of Transportation (Non-Medical): No  Physical Activity: Not on file  Stress: Not on file  Social Connections: Not on file     FAMILY HISTORY:  We obtained a detailed, 4-generation family history.  Significant diagnoses are listed below: Family History  Problem Relation Age of Onset    Hypertension Mother    Heart disease Mother    Hyperlipidemia Mother    Diabetes Father    Hypertension Father    Heart disease Father    Hyperlipidemia Father    Stroke Father    Prostate cancer Father    Lymphoma Sister        NON-HODGKINS LYMPHOMA   Endocrine tumor Sister        neuroendocrine tumor   Lung cancer Maternal Uncle        small cell   Endocrine tumor Other        pateral first cousin once removed, neuroendocrine tumor   Colon cancer Neg Hx  Rectal cancer Neg Hx    Esophageal cancer Neg Hx     Ms. Aguallo is unaware of previous family history of genetic testing for hereditary cancer risks. There is no reported Ashkenazi Jewish ancestry.     GENETIC COUNSELING ASSESSMENT: Ms. Schaff is a 48 y.o. female with a personal and family history of tumors and cancer which is somewhat suggestive of a hereditary predisposition to cancer given her personal history of a neuroendocrine tumor and her family history of three first degree relatives diagnosed with solid primary tumors and multiple individuals in the family diagnosed with neuroendocrine tumors. We, therefore, discussed and recommended the following at today's visit.   DISCUSSION: We discussed greater than 95% of ileal neuroendocrine tumors are thought to be sporadic, but that there are several rare inherited conditions that can predispose someone to neuroendocrine tumors, including Multiple Endocrine Neoplasia types 1 and 2, Von Hippel Lindau syndrome, Carney complex, tuberous sclerosis, and others. We discussed that given the patterns of neuroendocrine tumors in her family, testing is beneficial for several reasons including knowing how to follow individuals after completing their treatment, identifying whether potential treatment options  would be beneficial, and understanding if other family members could be at risk for cancer and allowing them to undergo genetic testing.   We reviewed the characteristics, features and  inheritance patterns of hereditary cancer syndromes. We also discussed genetic testing, including the appropriate family members to test, the process of testing, insurance coverage and turn-around-time for results. We discussed the implications of a negative, positive, carrier and/or variant of uncertain significant result. We recommended Ms. Tozzi pursue genetic testing for a panel that includes genes associated with neuroendocrine tumors.   Ms. Spina  was offered an expanded pan-cancer panel (70 genes). Ms. Printup was informed of the benefits and limitations of each panel, including that expanded pan-cancer panels contain genes that do not have clear management guidelines at this point in time.  We also discussed that as the number of genes included on a panel increases, the chances of variants of uncertain significance increases. After considering the benefits and limitations of each gene panel, Ms. Lueras elected to have [facility] CancerNext-Expanded +RNAinsight panel.  Based on Ms. Dirico's personal and family history of cancer, she meet UHC criteria for genetic testing. Despite that she meets criteria, she may still have an out of pocket cost. We discussed that if her out of pocket cost for testing is over $100, the laboratory should contact them to discuss self-pay prices, patient pay assistance programs, if applicable, and other billing options.  PLAN: After considering the risks, benefits, and limitations, Ms. Whitsell provided informed consent to pursue genetic testing and the blood sample was sent to [facility] for analysis of the CancerNext-Expanded +RNAinsight test. Results should be available within approximately 2-3 weeks' time, at which point they will be disclosed by telephone to Ms. Miner, as will any additional recommendations warranted by these results. Ms. Twiggs will receive a summary of her genetic counseling visit and a copy of her results once available. This information will  also be available in Epic.   Lastly, we encouraged Ms. Kaney to remain in contact with cancer genetics annually so that we can continuously update the family history and inform her of any changes in cancer genetics and testing that may be of benefit for this family.   Ms. Zumbro questions were answered to her satisfaction today. Our contact information was provided should additional questions or concerns arise. Thank you for  the referral and allowing us  to share in the care of your patient.   Burnard Ogren, MS, [facility] Licensed, Retail [email or web address] phone: [phone]   40 minutes were spent on the date of the encounter in service to the patient including preparation, face-to-face consultation, documentation and care coordination.  The patient was seen alone.  Drs. Gudena and/or Lanny were available to discuss this case as needed.   _______________________________________________________________________ For Office Staff:  Number of people involved in session: 1 Was an Intern/ student involved with case: no

## 2023-10-23 ENCOUNTER — Telehealth: Payer: Self-pay | Admitting: Genetic Counselor

## 2023-11-06 ENCOUNTER — Encounter: Payer: Self-pay | Admitting: Neurology

## 2023-11-06 ENCOUNTER — Ambulatory Visit: Admitting: Neurology

## 2023-11-06 VITALS — BP 166/89 | HR 95 | Resp 20 | Ht 62.6 in | Wt 178.0 lb

## 2023-11-06 DIAGNOSIS — G43009 Migraine without aura, not intractable, without status migrainosus: Secondary | ICD-10-CM

## 2023-11-06 DIAGNOSIS — G40309 Generalized idiopathic epilepsy and epileptic syndromes, not intractable, without status epilepticus: Secondary | ICD-10-CM | POA: Diagnosis not present

## 2023-11-06 MED ORDER — ZONISAMIDE 100 MG PO CAPS: ORAL_CAPSULE | ORAL | 3 refills | Status: DC

## 2023-11-06 MED ORDER — AIMOVIG 140 MG/ML ~~LOC~~ SOAJ: SUBCUTANEOUS | 11 refills | Status: DC

## 2023-11-06 MED ORDER — LEVETIRACETAM 750 MG PO TABS: 750.0000 mg | ORAL_TABLET | Freq: Every day | ORAL | 3 refills | Status: DC

## 2023-11-06 MED ORDER — SUMATRIPTAN SUCCINATE 25 MG PO TABS: 25.0000 mg | ORAL_TABLET | ORAL | 6 refills | Status: DC | PRN

## 2023-11-06 NOTE — Progress Notes (Signed)
 NEUROLOGY FOLLOW UP OFFICE NOTE  Cassidy Tran [id number] 09-30-1975  HISTORY OF PRESENT ILLNESS: I had the pleasure of seeing Cassidy Tran in follow-up in the neurology clinic on 11/06/2023.  The patient was last seen 4 months ago for JME, neuropathy, and migraines. She is again accompanied by  her husband who helps supplement the history today. Records and images were personally reviewed where available.  She has been seizure-free since 2011 on Zonisamide  100mg  at bedtime and Levetiracetam  750mg  at bedtime, no side effects. They report she has a few myoclonic jerks when she does not sleep well. No staring/unresponsive episodes, focal numbness/tingling/weakness, no falls.  She was started on Aimovig  for migraine prophylaxis, she has had 4 shots and reports that after the first dose, she had no migraines, however with the next doses, migraines started increasing again, she is back to 2-3 a week. No side effects on Aimovig . She states the pharmacy did not fill the sumatriptan . She has Gabapentin  for neuropathy taking it around once a month when feet are bothering her. Sleep and mood are good.   History on Initial Assessment 08/02/2021: This is a pleasant 48 year old right-handed woman with a history of von Willebrand disease, neuroendocrine tumor, Juvenile Myoclonic Epilepsy since age 23, presenting to establish care for seizures. Records were reviewed. Seizures are usually preceded by myoclonic jerks. She reports longest seizure-free interval was 25 years, then she had a GTC in 2009, 2010, and 2011. She and her husband deny any convulsions since 2011. She was being followed at [facility], last visit was in 2017. At that time she was on Zonisamide  100mg  BID and Depakote  ER 2000mg  qhs. She has not seen a neurologist since then. She started having diarrhea in 08/2020 and was found to have biopsy-proven neuroendocrine tumor of the terminal ileum. She underwent GI surgery (exlap, R hemicolectomy,  cholecystectomy) in 05/05/21, course was complicated by respiratory failure requiring intubation and trach. She had altered mental status with hyperammonemia (104) felt to be due to Depakote . She was seen by Neurology during her admission and Depakote  was stopped. She was on IV Levetiracetam  and had continuous EEG monitoring for 48 hours with diffuse background slowing, no epileptiform discharges or electrographic seizures. She was discharged on Levetiracetam  750mg  BID and Zonisamide  100mg  BID, however states that the LEV was stopped and that she has only been taking Zonisamide  100mg  qhs. She and her husband deny any staring/unresponsive episodes, gaps in time, olfactory/gustatory hallucinations. She mostly has myoclonic jerks if she forgets to take her medication or when sleep deprived. She could not remember how she was taking the Zonisamide  prior to hospitalization.  She was in inpatient rehab after hospitalization from 4/9 to 06/25/21. She reports that she was dropped by a nurse tech and fell on her feet, and since then she has had bilateral foot drop R>L and constant burning/tingling on the dorsum of both feet and below her knees. She reported right foot pain and had an xray showing a subacute right 5th metatarsal fracture. She reports that Gabapentin  100mg  TID was started however she ran out of medication, it does help with the paresthesias. She ambulates with a walker. She denies any symptoms of neuropathy prior to her hospitalization. She has also been having numbness in the last 2 fingers on her right hand since hospitalization. She has tightness in her neck and back. She continues to deal with frequent diarrhea, no urinary issues. The diarrhea is causing a lot of anxiety, keeping her up at  night due to frequent BMs. She has a history of VP shunt secondary to Chiari malformation, she was seen by Neurosurgery during her admission and had a lumbar puncture with normal CSF. She continues to follow-up with  Neurosurgery for the shunt.   Epilepsy Risk Factors:  She had a normal birth and early development.  There is no history of febrile convulsions, CNS infections such as meningitis/encephalitis, significant traumatic brain injury, neurosurgical procedures, or family history of seizures.  Prior ASMs: Topiramate, Depakote  (hyperammonemia) Prior headache preventative medications: amitriptyline , Topiramate, Depakote   Diagnostic Data: EEGs: EEG in 2011 reported intermittent 4-5 Hz spike and polyspike and wave complexes MRI: MRI brain in 2015 no acute changes, hippocampi symmetric. At that time, note of cerebellar tonsillar ectopia measuring 4mm.  Head CT without contrast 05/2021 VP shunt in the occipital region on the right terminates in the frontal lobe on left, ventricles stable, no hydrocephalus. Prior Chiari decompression with stable appearance of foramen magnum   PAST MEDICAL HISTORY: Past Medical History:  Diagnosis Date   Acid reflux    Allergy    Anxiety    Arthritis    lower back   ASCUS (atypical squamous cells of undetermined significance) on Pap smear 03/2011   NEG HR HPV   Asthma    Cancer (HCC)    skin cancer- age 18ish   Cervical dysplasia, mild 08/2010   LGSIL colposcopy biopsy showing koilocytotic atypia   Clotting disorder (HCC)    Depression    Endometriosis    Headache(784.0)    High risk HPV infection 12/2011   Pap normal   Hypertension    Pneumonia    2016ish   Seizures (HCC)    seizure disorder   Smoker    Von Willebrand disease (HCC)    Well woman exam with routine gynecological exam 04/16/2023    MEDICATIONS: Current Outpatient Medications on File Prior to Visit  Medication Sig Dispense Refill   acetaminophen  (TYLENOL ) 325 MG tablet Take 1-2 tablets (325-650 mg total) by mouth every 4 (four) hours as needed for mild pain.     budesonide  (PULMICORT ) 0.5 MG/2ML nebulizer solution Take 1 mL (0.25 mg total) by nebulization 2 (two) times daily. 60 mL 0    carvedilol  (COREG ) 3.125 MG tablet Take 1 tablet (3.125 mg total) by mouth 2 (two) times daily with a meal. 60 tablet 0   colestipol  (COLESTID ) 1 g tablet Take 4 tablets (4 g total) by mouth 2 (two) times daily. 240 tablet 6   diphenoxylate -atropine  (LOMOTIL ) 2.5-0.025 MG tablet Take 2 tablets by mouth 4 (four) times daily. 30 tablet 0   doxepin (SINEQUAN) 25 MG capsule Take 25 mg by mouth at bedtime.     Erenumab -aooe (AIMOVIG ) 140 MG/ML SOAJ Inject 140 mg into the skin every 30 (thirty) days. 1.12 mL 11   gabapentin  (NEURONTIN ) 100 MG capsule Take once a day as needed for neuropathy 90 capsule 3   levETIRAcetam  (KEPPRA ) 750 MG tablet Take 1 tablet (750 mg total) by mouth at bedtime. 90 tablet 3   loperamide  (IMODIUM  A-D) 2 MG tablet Take 2 mg by mouth 4 (four) times daily as needed for diarrhea or loose stools.     Magnesium  250 MG TABS Take 250 mg by mouth at bedtime.     mometasone  (NASONEX ) 50 MCG/ACT nasal spray Use one to two sprays in each nostril once daily 17 g 5   potassium chloride  SA (KLOR-CON  M) 20 MEQ tablet TAKE 2 TABLETS(40 MEQ) BY MOUTH TWICE  DAILY FOR 7 DAYS THEN TAKE 1 TABLET BY MOUTH TWICE DAILY 120 tablet 2   SUMAtriptan  (IMITREX ) 25 MG tablet Take 1 tablet (25 mg total) by mouth every 2 (two) hours as needed for migraine. May repeat in 2 hours if headache persists or recurs. 10 tablet 6   Tiotropium Bromide -Olodaterol (STIOLTO RESPIMAT ) 2.5-2.5 MCG/ACT AERS Inhale 2 puffs into the lungs daily. 4 g 0   zonisamide  (ZONEGRAN ) 100 MG capsule TAKE 1 CAPSULE BY MOUTH EVERY NIGHT 90 capsule 3   Current Facility-Administered Medications on File Prior to Visit  Medication Dose Route Frequency Provider Last Rate Last Admin   lanreotide acetate  (SOMATULINE DEPOT ) injection 120 mg  120 mg Subcutaneous Once Ennever, Maude SAUNDERS, MD        ALLERGIES: Allergies  Allergen Reactions   Aspirin Other (See Comments)    Childhood reaction   Codeine Nausea And Vomiting    Tolerates Hydrocodone     Erythromycin Other (See Comments)    Childhood reaction, tolerate Zpak   Lactose Intolerance (Gi) Diarrhea   Nsaids Nausea And Vomiting   Penicillins Other (See Comments)    Has patient had a PCN reaction causing immediate rash, facial/tongue/throat swelling, SOB or lightheadedness with hypotension: doesn't remember childhood Reaction   Has patient had a PCN reaction causing severe rash involving mucus membranes or skin necrosis: NO  Has patient had a PCN reaction that required hospitalization NO  Has patient had a PCN reaction occurring within the last 10 years: NO  If all of the above answers are NO, then may proceed with Cephalosporin use.    FAMILY HISTORY: Family History  Problem Relation Age of Onset   Hypertension Mother    Heart disease Mother    Hyperlipidemia Mother    Diabetes Father    Hypertension Father    Heart disease Father    Hyperlipidemia Father    Stroke Father    Prostate cancer Father    Lymphoma Sister        NON-HODGKINS LYMPHOMA   Endocrine tumor Sister        neuroendocrine tumor   Lung cancer Maternal Uncle        small cell   Endocrine tumor Other        pateral first cousin once removed, neuroendocrine tumor   Colon cancer Neg Hx    Rectal cancer Neg Hx    Esophageal cancer Neg Hx     SOCIAL HISTORY: Social History   Socioeconomic History   Marital status: Married    Spouse name: Not on file   Number of children: Not on file   Years of education: Not on file   Highest education level: Not on file  Occupational History   Occupation: Customer Service  Tobacco Use   Smoking status: Some Days    Current packs/day: 0.00    Average packs/day: 0.5 packs/day for 20.0 years (10.0 ttl pk-yrs)    Types: Cigarettes    Start date: 05/05/2001    Last attempt to quit: 05/05/2021    Years since quitting: 2.5   Smokeless tobacco: Never  Vaping Use   Vaping status: Never Used  Substance and Sexual Activity   Alcohol use: Not Currently     Comment: quit since March   Drug use: No   Sexual activity: Yes    Birth control/protection: Surgical    Comment: Hyst, First IC @ 40 y/o, >5 Partners  Other Topics Concern   Not on file  Social History Narrative  Right handed    Live with husband and dog in a one level home.    Caffeine  rarely   Social Drivers of Corporate investment banker Strain: Not on file  Food Insecurity: No Food Insecurity (06/26/2022)   Hunger Vital Sign    Worried About Running Out of Food in the Last Year: Never true    Ran Out of Food in the Last Year: Never true  Transportation Needs: No Transportation Needs (06/26/2022)   PRAPARE - Administrator, Civil Service (Medical): No    Lack of Transportation (Non-Medical): No  Physical Activity: Not on file  Stress: Not on file  Social Connections: Not on file  Intimate Partner Violence: Not At Risk (06/26/2022)   Humiliation, Afraid, Rape, and Kick questionnaire    Fear of Current or Ex-Partner: No    Emotionally Abused: No    Physically Abused: No    Sexually Abused: No     PHYSICAL EXAM: Vitals:   11/06/23 1104  BP: (!) 166/89  Pulse: 95  Resp: 20  SpO2: 96%   General: No acute distress Head:  Normocephalic/atraumatic Skin/Extremities: No rash, no edema Neurological Exam: alert and awake. No aphasia or dysarthria. Fund of knowledge is appropriate.  Attention and concentration are normal.   Cranial nerves: Pupils equal, round. Extraocular movements intact with no nystagmus. Visual fields full.  No facial asymmetry.  Motor: Bulk and tone normal, muscle strength 5/5 throughout with no pronator drift.   Finger to nose testing intact.  Gait narrow-based and steady, able to tandem walk adequately.  Romberg negative.   IMPRESSION: This is a pleasant 48 yo RH woman with a history of von Willebrand disease, neuroendocrine tumor, with Juvenile Myoclonic Epilepsy since age 1. No convulsions since 2011, she has brief myoclonic jerks when sleep  deprived. Continue Levetiracetam  750mg  at bedtime and Zonisamide  100mg  at bedtime (morning dose makes her feel off). There was a brief good response to initiation of Aimovig  but now she is back to 3-4 migraines a week, she will try taking the Aimovig  every 28 days and continue migraine diary. Prescription for prn sumatriptan  sent, side effects discussed. She is aware of [place] driving laws to stop driving after a seizure until 6 months seizure-free. Follow-up in 4 months, call for any changes.    Thank you for allowing me to participate in her care.  Please do not hesitate to call for any questions or concerns.    Darice Shivers, M.D.   CC: Dr. Gerome

## 2023-11-06 NOTE — Patient Instructions (Signed)
 Good to see you.  Let's try taking the Aimovig  every 28 days and monitor if this helps reduce migraine frequency  2. Another prescription for as needed Sumatriptan  was sent to the pharmacy, let me know if any issues  3. Continue Zonisamide  100mg  every night and Keppra  750mg  every night  4. Continue migraine calendar, follow-up in 4 months, call for any changes   Seizure Precautions: 1. If medication has been prescribed for you to prevent seizures, take it exactly as directed.  Do not stop taking the medicine without talking to your doctor first, even if you have not had a seizure in a long time.   2. Avoid activities in which a seizure would cause danger to yourself or to others.  Don't operate dangerous machinery, swim alone, or climb in high or dangerous places, such as on ladders, roofs, or girders.  Do not drive unless your doctor says you may.  3. If you have any warning that you may have a seizure, lay down in a safe place where you can't hurt yourself.    4.  No driving for 6 months from last seizure, as per [place]  state law.   Please refer to the following link on the Epile[employer] of America's website for more information: http://www.epilepsyfoundation.org/answerplace/Social/driving/drivingu.cfm   5.  Maintain good sleep hygiene.  6.  Contact your doctor if you have any problems that may be related to the medicine you are taking.  7.  Call 911 and bring the patient back to the ED if:        A.  The seizure lasts longer than 5 minutes.       B.  The patient doesn't awaken shortly after the seizure  C.  The patient has new problems such as difficulty seeing, speaking or moving  D.  The patient was injured during the seizure  E.  The patient has a temperature over 102 F (39C)  F.  The patient vomited and now is having trouble breathing

## 2023-11-13 ENCOUNTER — Ambulatory Visit
Admission: RE | Admit: 2023-11-13 | Discharge: 2023-11-13 | Disposition: A | Discharge status: Home / Self Care | Source: Ambulatory Visit | Attending: Family Medicine | Admitting: Family Medicine

## 2023-11-13 DIAGNOSIS — Z1231 Encounter for screening mammogram for malignant neoplasm of breast: Secondary | ICD-10-CM

## 2023-11-22 ENCOUNTER — Telehealth: Payer: Self-pay

## 2023-11-22 NOTE — Telephone Encounter (Signed)
 The patient's appointment has been rescheduled to March 05, 2023, to accommodate the provider's updated office hours. A letter confirming the new appointment date has been mailed to the patient.

## 2023-12-07 ENCOUNTER — Encounter: Payer: Self-pay | Admitting: Hematology & Oncology

## 2023-12-07 ENCOUNTER — Ambulatory Visit: Payer: Self-pay | Admitting: Genetic Counselor

## 2023-12-07 DIAGNOSIS — Z1379 Encounter for other screening for genetic and chromosomal anomalies: Secondary | ICD-10-CM | POA: Insufficient documentation

## 2023-12-07 NOTE — Telephone Encounter (Signed)
 Unable to reach patient with genetic test results. Will upload and sent through myChart and share with oncologist.

## 2023-12-07 NOTE — Progress Notes (Signed)
 HPI:  Cassidy Tran was previously seen in the [facility] due to a personal and family history of cancer and concerns regarding a hereditary predisposition to cancer. Please refer to our prior cancer genetics clinic note for more information regarding our discussion, assessment and recommendations, at the time. Cassidy Tran recent genetic test results were disclosed to her, as were recommendations warranted by these results. These results and recommendations are discussed in more detail below.  We have not been able to reach Cassidy Tran with her results by telephone, myChart or email. Results will be uploaded to her chart and sent to Cassidy Tran and her providers through the EHR.   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   FAMILY HISTORY:  We obtained a detailed, 4-generation family history.  Significant diagnoses are listed below: Family History  Problem Relation Age of Onset   Hypertension Mother    Heart disease Mother    Hyperlipidemia Mother    Diabetes Father    Hypertension Father    Heart disease Father    Hyperlipidemia Father    Stroke Father    Prostate cancer Father    Lymphoma Sister        NON-HODGKINS LYMPHOMA   Endocrine tumor Sister        neuroendocrine tumor   Lung cancer Maternal Uncle        small cell   Endocrine tumor Other        pateral first cousin once removed, neuroendocrine tumor   Colon cancer Neg Hx    Rectal cancer Neg Hx    Esophageal cancer Neg Hx     Cassidy Tran is unaware of previous family history of genetic testing for hereditary cancer risks. There is no reported Ashkenazi Jewish ancestry.      GENETIC TEST RESULTS: Genetic testing reported out on 10/22/23 through the [facility] CancerNext-Expanded +RNAinsight panel found no pathogenic mutations. The CancerNext-Expanded gene panel offered by [facility] and includes sequencing, rearrangement, and RNA  analysis for the following 77 genes: AIP, ALK, APC, ATM, AXIN2, BAP1, BARD1, BMPR1A, BRCA1, BRCA2, BRIP1, CDC73, CDH1, CDK4, CDKN1B, CDKN2A, CEBPA, CHEK2, CTNNA1, DDX41, DICER1, EGFR, EPCAM, ETV6, FH, FLCN, GATA2, GREM1, HOXB13, KIT, LZTR1, MAX, MBD4, MEN1, MET, MITF, MLH1, MSH2, MSH3, MSH6, MUTYH, NF1, NF2, NTHL1, PALB2, PDGFRA, PHOX2B, PMS2, POLD1, POLE, POT1, PRKAR1A, PTCH1, PTEN, RAD51C, RAD51D, RB1, RET, RPS20, RUNX1, SDHA, SDHAF2, SDHB, SDHC, SDHD, SMAD4, SMARCA4, SMARCB1, SMARCE1, STK11, SUFU, TMEM127, TP53, TSC1, TSC2, VHL, WT1. The test report has been scanned into EPIC and is located under the Molecular Pathology section of the Results Review tab.  A portion of the result report is included below for reference.     Current genetic testing is not perfect, it is possible there may be a gene mutation in one of these genes that current testing cannot detect, but that chance is small.  There could be another gene that has not yet been discovered, or that we have not yet tested, that is responsible for the cancer diagnoses in the family. It is also possible there is a hereditary cause for the cancer in the family that Cassidy Tran did not inherit and therefore was not identified in her testing.  Therefore, it is important to remain in touch with cancer genetics in the future so that we can continue to offer Cassidy Tran the most up to date genetic testing.   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  At this time, it is unknown if this variant is associated with increased cancer risk or if this is a normal finding, but most variants such as this get reclassified to being inconsequential. It should not be used to make medical management decisions. With time, we suspect the lab will determine the significance of this variant, if any. If we do learn more about it, we will try to contact Cassidy Tran to discuss it further.  However, it is important to stay in touch with us  periodically and keep the address and phone number up to date.  ADDITIONAL GENETIC TESTING: Cassidy Tran genetic testing was fairly extensive.  If there are genes identified to increase cancer risk that can be analyzed in the future, we would be happy to discuss and coordinate this testing at that time.    CANCER SCREENING RECOMMENDATIONS: Cassidy Tran test result is considered negative (normal).  This means that we have not identified a hereditary cause for her personal and family history of cancer at this time. Most cancers happen by chance and this negative test suggests that her personal and family history of cancer may fall into this category.    Possible reasons for Cassidy Tran's negative genetic test include:  1. There may be a gene mutation in one of these genes that current testing methods cannot detect. The likelihood of this is low, but possible.   2. There could be another gene that has not yet been discovered, or that we have not yet tested, that is responsible for the cancer diagnoses in the family.  3.  There may be no hereditary risk for cancer in the family. The cancers in Cassidy Tran and/or her family may be sporadic/familial or due to other genetic and environmental factors. 4. It is also possible there is a hereditary cause for the cancer in the family that Cassidy Tran did not inherit.  Therefore, it is recommended she continue to follow the cancer management and screening guidelines provided by her oncology and primary healthcare provider. An individual's cancer risk and medical management are not determined by genetic test results alone. Overall cancer risk assessment incorporates additional factors, including personal medical history, family history, and any available genetic information that may result in a personalized plan for cancer prevention and surveillance  Given Cassidy Tran's personal and family histories, we must interpret these  negative results with some caution.  Families with features suggestive of hereditary risk for cancer tend to have multiple family members with cancer, diagnoses in multiple generations and diagnoses before the age of 28. Cassidy Tran family exhibits some of these features. Thus, this result may simply reflect our current inability to detect all mutations within these genes or there may be a different gene that has not yet been discovered or tested.   RECOMMENDATIONS FOR FAMILY MEMBERS:  Individuals in this family might be at some increased risk of developing cancer, over the general population risk, simply due to the family history of cancer.  We recommended women in this family have a yearly mammogram beginning at age 50, or 70 years younger than the earliest onset of cancer, an annual clinical breast exam, and perform monthly breast self-exams. Women in this family should also have a gynecological exam as recommended by their primary provider. All family members should be referred for colonoscopy starting at age 31, or 50 years younger than the earliest onset  of cancer.  Other relatives may benefit from completing their own genetic testing, especially if they have been diagnosed with cancer.   FOLLOW-UP: Cancer genetics is a rapidly advancing field and it is possible that new genetic tests will be appropriate for her and/or her family members in the future. We encouraged her to remain in contact with cancer genetics on an annual basis so we can update her personal and family histories and let her know of advances in cancer genetics that may benefit this family.   Our contact number was provided. Ms. Blackwelder is welcome to call us  at anytime with additional questions or concerns.   Burnard Ogren, MS, [facility] Licensed, Retail [email or web address] [phone]

## 2023-12-12 ENCOUNTER — Encounter: Payer: Self-pay | Admitting: Physician Assistant

## 2023-12-12 ENCOUNTER — Ambulatory Visit: Admitting: Physician Assistant

## 2023-12-12 VITALS — BP 130/76 | HR 80 | Ht 63.0 in | Wt 179.2 lb

## 2023-12-12 DIAGNOSIS — R197 Diarrhea, unspecified: Secondary | ICD-10-CM | POA: Diagnosis not present

## 2023-12-12 DIAGNOSIS — Z8719 Personal history of other diseases of the digestive system: Secondary | ICD-10-CM

## 2023-12-12 DIAGNOSIS — R1013 Epigastric pain: Secondary | ICD-10-CM | POA: Diagnosis not present

## 2023-12-12 DIAGNOSIS — Z9049 Acquired absence of other specified parts of digestive tract: Secondary | ICD-10-CM | POA: Diagnosis not present

## 2023-12-12 DIAGNOSIS — Z860100 Personal history of colon polyps, unspecified: Secondary | ICD-10-CM

## 2023-12-12 DIAGNOSIS — R109 Unspecified abdominal pain: Secondary | ICD-10-CM

## 2023-12-12 DIAGNOSIS — R194 Change in bowel habit: Secondary | ICD-10-CM

## 2023-12-12 DIAGNOSIS — D3A8 Other benign neuroendocrine tumors: Secondary | ICD-10-CM

## 2023-12-12 MED ORDER — AMITRIPTYLINE HCL 50 MG PO TABS: 50.0000 mg | ORAL_TABLET | Freq: Every day | ORAL | 2 refills | Status: DC

## 2023-12-12 NOTE — Patient Instructions (Addendum)
 VISIT SUMMARY:  During your visit, we discussed your ongoing chronic diarrhea and new abdominal pain. We also addressed your recent wheezing related to a respiratory infection.  YOUR PLAN:  CHRONIC DIARRHEA: You continue to experience frequent bowel movements despite current medications. -Continue taking colestipol  4 grams twice daily. -Continue using Lomotil  as needed. -Keep up with your fiber supplementation using Fibercon pills. -Start taking amitriptyline  50 mg, beginning with half a tablet and adjusting as needed. -Stop taking doxepin. -Schedule your colonoscopy in January.  ABDOMINAL WALL MYALGIA: Your new abdominal pain is likely due to muscle strain from recent coughing and prednisone  use. -Apply Salonpas patches to the painful area. -Use a heating pad to help relieve the pain. -If the pain continues, we may consider a trigger point injection. -Support your abdomen when coughing to reduce muscle strain.  WHEEZING: Your wheezing is likely due to a recent upper respiratory infection and sinus issues. -Continue using your current inhaler as needed.  _______________________________________________________  If your blood pressure at your visit was 140/90 or greater, please contact your primary care physician to follow up on this.  _______________________________________________________  If you are age 50 or older, your body mass index should be between 23-30. Your Body mass index is 31.75 kg/m. If this is out of the aforementioned range listed, please consider follow up with your Primary Care Provider.  If you are age 50 or younger, your body mass index should be between 19-25. Your Body mass index is 31.75 kg/m. If this is out of the aformentioned range listed, please consider follow up with your Primary Care Provider.   ________________________________________________________  The [facility] providers would like to encourage you to use MYCHART to communicate with providers  for non-urgent requests or questions.  Due to long hold times on the telephone, sending your provider a message by [facility] may be a faster and more efficient way to get a response.  Please allow 48 business hours for a response.  Please remember that this is for non-urgent requests.  _______________________________________________________  [facility] is using a team-based approach to care.  Your team is made up of your doctor and two to three APPS. Our APPS (Nurse Practitioners and Physician Assistants) work with your physician to ensure care continuity for you. They are fully qualified to address your health concerns and develop a treatment plan. They communicate directly with your gastroenterologist to care for you. Seeing the Advanced Practice Practitioners on your physician's team can help you by facilitating care more promptly, often allowing for earlier appointments, access to diagnostic testing, procedures, and other specialty referrals.    I appreciate the  opportunity to care for you  Thank You   [facility]

## 2023-12-12 NOTE — Progress Notes (Signed)
 12/12/2023 Cassidy Tran [id number] [birth year]  Referring provider: Gerome Brunet, DO Primary GI doctor: Dr. San  ASSESSMENT AND PLAN:  Epigastric pain x 1 week Stabbing/tearing pain lasting seconds to minutes, isolated pain without radiation When not happening feels sore Not associated with food, feels very random, nothing better Just finished prednisone  and ABX, finished ABX yesterday and day before, coughing/sinuses Denies nausea, vomiting, dysphagia, melena + [place] Likely MSK from coughing - do heating pad, salon pas patches, consider trigger point injection - call if any changes  Diarrhea with personal history of C. Difficile Responding well to colestipol  4 g twice daily, Lomotil  as needed IBgard and Gas-X, did improve but has gotten worse On Fibercon pills Continues to have stool 4-8 times a day, unchanged Denies fever, chills nausea -Discussed increase risk with recent ABX for Cdiff but patient states that her diarrhea is unchanged, consider Cdiff testing if any worsening symptoms -Trial of amitriptyline  for diarrhea/sleep, 50 mg at bedtime, start 1/2 pill at night and can increase, stop doxepin - states diarrhea improved somewhat with prednisone , consider biopsies to rule out colitis at next colonoscopy - follow up 2 months, will schedule colonoscopy at that time  Personal history of TVA polyps  - Repeat colonoscopy in 02/2024 for ongoing surveillance   History of neuroendocrine tumor Diagnosed via colonoscopy in 02/2021 with NET of the terminal ileum  2023 status post right hemicolectomy with ileocolonic anastomosis and resection of 50 cm of small bowel path showing well-differentiated neuroendocrine tumor - Continue regular follow-up in the Oncology Clinic   GERD with grade a esophagitis and AVM Status post cholecystectomy No symptoms at this time, no melena Follow up or call if any symptoms  Patient Care Team: Gerome Brunet, DO as PCP -  General (Family Medicine) Georjean Darice HERO, MD as Consulting Physician (Neurology) Prentiss Annabella LABOR, NP as Nurse Practitioner (Gynecology)  HISTORY OF PRESENT ILLNESS: 48 y.o. female with a past medical history listed below presents for evaluation of epigastric pain.   Last seen by Dr. San 03/09/2023 for diarrhea  Discussed the use of AI scribe software for clinical note transcription with the patient, who gave verbal consent to proceed.  History of Present Illness   Cassidy Tran is a 48 year old female with chronic diarrhea who presents with new onset abdominal pain.  She has chronic diarrhea, currently taking colestipol  4 grams twice daily and Lomotil  as needed. Despite these medications, she continues to have frequent bowel movements, approximately four to eight times daily, which is an improvement from the thirty times daily when she was first seen. She incorporates as much fiber as possible into her diet, using Fibercon pills as she cannot tolerate fiber powders.  She describes a new onset of abdominal pain that began about a week ago. The pain is localized to the epigastric region and is described as a 'squeezing, stabbing, and tearing' sensation lasting from thirty seconds to a couple of minutes. The pain occurs spontaneously, not related to eating, drinking, or any specific activity, and resolves on its own. Outside of these episodes, she feels as if she has been doing crunches for hours. There is no associated nausea, vomiting, reflux, heartburn, or changes in bowel habits with these episodes. She recently completed a course of prednisone  and antibiotics for sinus issues, finishing the antibiotics the day before this visit.  No dark or bloody stools and no worsening of diarrhea since completing the antibiotics. She has a history of C. difficile infection.  Her current medications include colestipol , Lomotil  as needed, and doxepin 25 mg at bedtime, which she rarely takes. She  has previously used amitriptyline , which she found effective for her symptoms in the past.      She  reports that she has been smoking cigarettes. She started smoking about 22 years ago. She has a 10 pack-year smoking history. She has never used smokeless tobacco. She reports that she does not currently use alcohol. She reports that she does not use drugs.  RELEVANT GI HISTORY, IMAGING AND LABS: Results         - 12/16/2020: Initial GI appointment for multiple GI symptoms, including diarrhea x1 year. Had unremarkable CBC, CMET, thyroid , CRP, ANA, fecal calprotectin, pancreatic elastase.  C. Difficile+, treated with vancomycin . - 03/11/2021: Colonoscopy showed perianal skin tags, 15 mm polyp in the rectum (TVA), nonbleeding angiodysplastic lesion, internal hemorrhoid.  Polypoid lesion terminal ileum (PATH: well-differentiated neuroendocrine grade 1 tumor).  Random colon biopsies negative macroscopic colitis. - 03/11/2021: EGD: LA grade A reflux esophagitis, single bleeding angiectasia in stomach status post MR clip, hematin in the gastric fundus and body, mild nonulcer gastritis, normal duodenum. Negative celiac, negative H. pylori - 05/05/2021: Right hemicolectomy with ileocolonic anastomosis and resection of 50 cm of small bowel (path: Well differentiated neuroendocrine tumor).  Cholecystectomy.  Prolonged hospital course including trach/PEG MDCT and rehab facility.  Elevated chromogranin A at 463, treated with Somatuline and follows with Dr. Timmy - For the diarrhea, has trialed fiber supplement, low FODMAP diet, Creon  (no change), cholestyramine  (no change), Lomotil , Imodium , tincture of opium  (because prohibitive) - 08/17/2021: PET CT dotatate: No residual neuroendocrine tumor, no metastasis - 10/10/2021: GI follow-up appointment: Some improvement with Creon .  Recommended colestipol  pills, low-fat diet with frequent meals.  Consideration for SIBO testing - 11/16/2021: Chromogranin A 17.1 - 05/2022:  Normal 5 HIAA, fasting gastrin, vasoactive intestinal peptide CBC    Component Value Date/Time   WBC 11.7 (H) 08/24/2023 1212   WBC 12.2 (H) 05/30/2022 1724   RBC 4.54 08/24/2023 1212   HGB 13.4 08/24/2023 1212   HGB 12.5 08/07/2017 1836   HGB 13.8 09/27/2012 1557   HCT 39.0 08/24/2023 1212   HCT 38.2 08/07/2017 1836   HCT 40.8 09/27/2012 1557   PLT 372 08/24/2023 1212   PLT 405 08/07/2017 1836   MCV 85.9 08/24/2023 1212   MCV 87 08/07/2017 1836   MCV 88.5 09/27/2012 1557   MCH 29.5 08/24/2023 1212   MCHC 34.4 08/24/2023 1212   RDW 14.4 08/24/2023 1212   RDW 14.8 08/07/2017 1836   RDW 14.0 09/27/2012 1557   LYMPHSABS 4.1 (H) 08/24/2023 1212   LYMPHSABS 3.7 (H) 08/07/2017 1836   LYMPHSABS 4.4 Repeated and Verified (H) 09/27/2012 1557   MONOABS 0.5 08/24/2023 1212   MONOABS 0.5 09/27/2012 1557   EOSABS 0.1 08/24/2023 1212   EOSABS 0.1 08/07/2017 1836   BASOSABS 0.1 08/24/2023 1212   BASOSABS 0.0 08/07/2017 1836   BASOSABS 0.0 09/27/2012 1557   Recent Labs    06/29/23 1132 08/24/23 1212  HGB 14.1 13.4    CMP     Component Value Date/Time   NA 143 08/24/2023 1212   K 2.9 (L) 08/24/2023 1212   CL 104 08/24/2023 1212   CO2 32 08/24/2023 1212   GLUCOSE 92 08/24/2023 1212   BUN 8 08/24/2023 1212   CREATININE 0.66 08/24/2023 1212   CREATININE 0.73 01/26/2020 1006   CALCIUM  8.9 08/24/2023 1212   PROT 7.7 05/30/2022 1724  ALBUMIN 4.4 05/30/2022 1724   AST 13 (L) 05/30/2022 1724   AST 15 03/21/2022 0829   ALT 17 05/30/2022 1724   ALT 19 03/21/2022 0829   ALKPHOS 124 05/30/2022 1724   BILITOT 0.7 05/30/2022 1724   BILITOT 0.7 03/21/2022 0829   GFRNONAA >60 08/24/2023 1212   GFRAA >60 11/24/2016 0620      Latest Ref Rng & Units 05/30/2022    5:24 PM 05/30/2022    7:49 AM 03/21/2022    8:29 AM  Hepatic Function  Total Protein 6.5 - 8.1 g/dL 7.7  7.4  7.7   Albumin 3.5 - 5.0 g/dL 4.4  4.3  4.5   AST 15 - 41 U/L 13  18  15    ALT 0 - 44 U/L 17  18  19    Alk  Phosphatase 38 - 126 U/L 124  136  154   Total Bilirubin 0.3 - 1.2 mg/dL 0.7  0.8  0.7       Current Medications:     Facility-Administered Medications Ordered in Other Visits (Endocrine & Metabolic):    lanreotide acetate  (SOMATULINE DEPOT ) injection 120 mg  Current Outpatient Medications (Cardiovascular):    amLODipine -valsartan  (EXFORGE ) 10-320 MG tablet, Take 1 tablet by mouth daily.   atorvastatin  (LIPITOR) 20 MG tablet, Take 20 mg by mouth daily.   carvedilol  (COREG ) 3.125 MG tablet, Take 1 tablet (3.125 mg total) by mouth 2 (two) times daily with a meal.   colestipol  (COLESTID ) 1 g tablet, Take 4 tablets (4 g total) by mouth 2 (two) times daily.   rosuvastatin (CRESTOR) 5 MG tablet, Take 5 mg by mouth daily.   Current Outpatient Medications (Respiratory):    albuterol  (VENTOLIN  HFA) 108 (90 Base) MCG/ACT inhaler, Inhale 2 puffs into the lungs every 4 (four) hours as needed.   Azelastine HCl 137 MCG/SPRAY SOLN, Place 137 mcg into the nose in the morning and at bedtime.   budesonide  (PULMICORT ) 0.5 MG/2ML nebulizer solution, Take 1 mL (0.25 mg total) by nebulization 2 (two) times daily.   mometasone  (NASONEX ) 50 MCG/ACT nasal spray, Use one to two sprays in each nostril once daily   Tiotropium Bromide -Olodaterol (STIOLTO RESPIMAT ) 2.5-2.5 MCG/ACT AERS, Inhale 2 puffs into the lungs daily.   Current Outpatient Medications (Analgesics):    acetaminophen  (TYLENOL ) 325 MG tablet, Take 1-2 tablets (325-650 mg total) by mouth every 4 (four) hours as needed for mild pain.   Erenumab -aooe (AIMOVIG ) 140 MG/ML SOAJ, Inject 140mg  into the skin every 28 days   SUMAtriptan  (IMITREX ) 25 MG tablet, Take 1 tablet (25 mg total) by mouth every 2 (two) hours as needed for migraine. May repeat in 2 hours if headache persists or recurs.     Current Outpatient Medications (Other):    amitriptyline  (ELAVIL ) 50 MG tablet, Take 1 tablet (50 mg total) by mouth at bedtime. 1/2-1 pill as needed for  nausea/vomiting   diphenoxylate -atropine  (LOMOTIL ) 2.5-0.025 MG tablet, Take 2 tablets by mouth 4 (four) times daily.   gabapentin  (NEURONTIN ) 100 MG capsule, Take once a day as needed for neuropathy   levETIRAcetam  (KEPPRA ) 750 MG tablet, Take 1 tablet (750 mg total) by mouth at bedtime.   loperamide  (IMODIUM  A-D) 2 MG tablet, Take 2 mg by mouth 4 (four) times daily as needed for diarrhea or loose stools.   Magnesium  250 MG TABS, Take 250 mg by mouth at bedtime.   ondansetron  (ZOFRAN ) 4 MG tablet, Take 4 mg by mouth every 8 (eight) hours as needed for  nausea.   pantoprazole  (PROTONIX ) 40 MG tablet, Take 40 mg by mouth daily.   potassium chloride  SA (KLOR-CON  M) 20 MEQ tablet, TAKE 2 TABLETS(40 MEQ) BY MOUTH TWICE DAILY FOR 7 DAYS THEN TAKE 1 TABLET BY MOUTH TWICE DAILY   zonisamide  (ZONEGRAN ) 100 MG capsule, TAKE 1 CAPSULE BY MOUTH EVERY NIGHT  No current facility-administered medications for this visit.  Medical History:  Past Medical History:  Diagnosis Date   Acid reflux    Allergy    Anxiety    Arthritis    lower back   ASCUS (atypical squamous cells of undetermined significance) on Pap smear 03/2011   NEG HR HPV   Asthma    Cancer (HCC)    skin cancer- age 18ish   Cervical dysplasia, mild 08/2010   LGSIL colposcopy biopsy showing koilocytotic atypia   Clotting disorder    Depression    Endometriosis    Headache(784.0)    High risk HPV infection 12/2011   Pap normal   Hypertension    Pneumonia    2016ish   Seizures (HCC)    seizure disorder   Smoker    Von Willebrand disease (HCC)    Well woman exam with routine gynecological exam 04/16/2023   Allergies:  Allergies  Allergen Reactions   Aspirin Other (See Comments)    Childhood reaction   Codeine Nausea And Vomiting    Tolerates Hydrocodone    Erythromycin Other (See Comments)    Childhood reaction, tolerate Zpak   Lactose Intolerance (Gi) Diarrhea   Nsaids Nausea And Vomiting   Penicillins Other (See  Comments)    Has patient had a PCN reaction causing immediate rash, facial/tongue/throat swelling, SOB or lightheadedness with hypotension: doesn't remember childhood Reaction   Has patient had a PCN reaction causing severe rash involving mucus membranes or skin necrosis: NO  Has patient had a PCN reaction that required hospitalization NO  Has patient had a PCN reaction occurring within the last 10 years: NO  If all of the above answers are NO, then may proceed with Cephalosporin use.     Surgical History:  She  has a past surgical history that includes Nasal sinus surgery (1993); Wisdom tooth extraction (1993); Tonsillectomy; Hysteroscopy with D & C (10/14/2010); laparoscopy (04/27/2011); Colposcopy; Dilation and curettage of uterus; Laparoscopic hysterectomy (N/A, 10/14/2012); Cystoscopy (N/A, 10/14/2012); Breast biopsy (Left, 02/2016); Suboccipital craniectomy cervical laminectomy (N/A, 11/06/2016); Ventriculoperitoneal shunt (N/A, 11/24/2016); Application of cranial navigation (N/A, 11/24/2016); Brain surgery; laparotomy (N/A, 05/05/2021); Colon resection (N/A, 05/05/2021); Cholecystectomy (N/A, 05/05/2021); Tracheostomy tube placement (N/A, 05/30/2021); and PEG placement (N/A, 05/30/2021). Family History:  Her family history includes Diabetes in her father; Endocrine tumor in her sister and another family member; Heart disease in her father and mother; Hyperlipidemia in her father and mother; Hypertension in her father and mother; Lung cancer in her maternal uncle; Lymphoma in her sister; Prostate cancer in her father; Stroke in her father.  REVIEW OF SYSTEMS  : All other systems reviewed and negative except where noted in the History of Present Illness.  PHYSICAL EXAM: BP 130/76 (BP Location: Left Arm, Patient Position: Sitting, Cuff Size: Normal)   Pulse 80   Ht 5' 3 (1.6 m)   Wt 179 lb 4 oz (81.3 kg)   LMP 09/16/2012   BMI 31.75 kg/m  Physical Exam   GENERAL APPEARANCE: Well  nourished, in no apparent distress. HEENT: No cervical lymphadenopathy, unremarkable thyroid , sclerae anicteric, conjunctiva pink. RESPIRATORY: Respiratory effort normal, wheezing present bilaterally. CARDIO: RRR with  no MRGs, peripheral pulses intact. ABDOMEN: Tenderness in the right upper quadrant on palpation, pain exacerbated by engaging abdominal muscles. RECTAL: Declines. MUSCULOSKELETAL: Full ROM, normal gait, without edema. SKIN: Dry, intact without rashes or lesions. No jaundice. NEURO: Alert, oriented, no focal deficits. PSYCH: Cooperative, normal mood and affect.      Alan JONELLE Coombs, PA-C 10:00 AM

## 2023-12-13 ENCOUNTER — Telehealth: Payer: Self-pay | Admitting: Physician Assistant

## 2023-12-13 NOTE — Telephone Encounter (Signed)
 Returned patients call, no answer and mailbox full  I called and spoke too pharmacist about the patients prescription Elavil    Told him the correct prescription as noted in [facility] Office note from 10/15.

## 2023-12-13 NOTE — Telephone Encounter (Signed)
 Inbound call from patient stating pharmacy is unable to fill Elavil  medication due to prescription have two different instructions. Please advise, thank you

## 2023-12-19 ENCOUNTER — Other Ambulatory Visit: Payer: Self-pay | Admitting: Medical Genetics

## 2023-12-19 DIAGNOSIS — Z006 Encounter for examination for normal comparison and control in clinical research program: Secondary | ICD-10-CM

## 2024-01-08 ENCOUNTER — Encounter: Payer: Self-pay | Admitting: Neurology

## 2024-01-31 NOTE — Progress Notes (Unsigned)
 02/01/2024 Cassidy Tran [id number] November 06, 1975  Referring provider: Gerome Brunet, DO Primary GI doctor: Dr. San  ASSESSMENT AND PLAN:  Diarrhea with personal history of C. Difficile Responding well to colestipol  4 g twice daily,  Lomotil  as needed Started on amitriptyline  50 mg at night and has had improvement in her symptoms, 3 on a good day, can be up to 12 on a bad day, Denies fever, chills nausea, AB pain, nausea - will get Cdiff GDH/toxin with history but low likelyhood - states diarrhea improved somewhat with prednisone , schedule colonoscopy for diarrhea and surveillance, evaluate for microscopic colitis - consider increasing amtriptyline after colonoscopy - continue colestipol  4 grams BID - add on benefiber with coffee  Personal history of TVA polyps  - Repeat colonoscopy in 02/2024 for ongoing surveillance   History of neuroendocrine tumor Diagnosed via colonoscopy in 02/2021 with NET of the terminal ileum  2023 status post right hemicolectomy with ileocolonic anastomosis and resection of 50 cm of small bowel path showing well-differentiated neuroendocrine tumor 08/04/2023 chromogranin A 44.9 - Continue regular follow-up in the Oncology Clinic   GERD with grade a esophagitis and AVM Status post cholecystectomy No symptoms at this time, no melena Follow up or call if any symptoms  Patient Care Team: Gerome Brunet, DO as PCP - General (Family Medicine) Georjean Darice HERO, MD as Consulting Physician (Neurology) Prentiss Annabella LABOR, NP as Nurse Practitioner (Gynecology)  HISTORY OF PRESENT ILLNESS: 48 y.o. female with a past medical history listed below presents for evaluation of epigastric pain.   I last saw the patient in the office 12/12/2023  Discussed the use of AI scribe software for clinical note transcription with the patient, who gave verbal consent to proceed.  History of Present Illness   Javiana Anwar is a 48 year old female who presents with  diarrhea.  She experiences diarrhea with bowel movements occurring four to eight times a day. Initially, her symptoms improved but then worsened, with frequency varying daily. Since starting amitriptyline , symptoms have improved to about three bowel movements on a good day. She is currently taking 50 mg of amitriptyline  daily and continues on colestipol . Symptoms are not consistently related to food intake, as she can experience symptoms even after eating minimal food like a cracker.  No associated fevers, chills, nausea, or abdominal pain. Stools are not always watery and are slightly formed at times.  She has tried various fiber supplements but dislikes their taste and texture. She smokes cigarettes and had one this morning. No shortness of breath, trouble swallowing, reflux, or blood in stool. She is not on a blood thinner. Previously used doxepin for sleep, and while amitriptyline  causes some drowsiness in the evening, she does not experience morning grogginess.      She  reports that she has been smoking cigarettes. She started smoking about 22 years ago. She has a 10 pack-year smoking history. She has never used smokeless tobacco. She reports that she does not currently use alcohol. She reports that she does not use drugs.  RELEVANT GI HISTORY, IMAGING AND LABS: Results          - 12/16/2020: Initial GI appointment for multiple GI symptoms, including diarrhea x1 year. Had unremarkable CBC, CMET, thyroid , CRP, ANA, fecal calprotectin, pancreatic elastase.  C. Difficile+, treated with vancomycin . - 03/11/2021: Colonoscopy showed perianal skin tags, 15 mm polyp in the rectum (TVA), nonbleeding angiodysplastic lesion, internal hemorrhoid.  Polypoid lesion terminal ileum (PATH: well-differentiated neuroendocrine grade 1 tumor).  Random  colon biopsies negative macroscopic colitis. - 03/11/2021: EGD: LA grade A reflux esophagitis, single bleeding angiectasia in stomach status post MR clip, hematin  in the gastric fundus and body, mild nonulcer gastritis, normal duodenum. Negative celiac, negative H. pylori - 05/05/2021: Right hemicolectomy with ileocolonic anastomosis and resection of 50 cm of small bowel (path: Well differentiated neuroendocrine tumor).  Cholecystectomy.  Prolonged hospital course including trach/PEG MDCT and rehab facility.  Elevated chromogranin A at 463, treated with Somatuline and follows with Dr. Timmy - For the diarrhea, has trialed fiber supplement, low FODMAP diet, Creon  (no change), cholestyramine  (no change), Lomotil , Imodium , tincture of opium  (because prohibitive) - 08/17/2021: PET CT dotatate: No residual neuroendocrine tumor, no metastasis - 10/10/2021: GI follow-up appointment: Some improvement with Creon .  Recommended colestipol  pills, low-fat diet with frequent meals.  Consideration for SIBO testing - 11/16/2021: Chromogranin A 17.1 - 05/2022: Normal 5 HIAA, fasting gastrin, vasoactive intestinal peptide CBC    Component Value Date/Time   WBC 11.7 (H) 08/24/2023 1212   WBC 12.2 (H) 05/30/2022 1724   RBC 4.54 08/24/2023 1212   HGB 13.4 08/24/2023 1212   HGB 12.5 08/07/2017 1836   HGB 13.8 09/27/2012 1557   HCT 39.0 08/24/2023 1212   HCT 38.2 08/07/2017 1836   HCT 40.8 09/27/2012 1557   PLT 372 08/24/2023 1212   PLT 405 08/07/2017 1836   MCV 85.9 08/24/2023 1212   MCV 87 08/07/2017 1836   MCV 88.5 09/27/2012 1557   MCH 29.5 08/24/2023 1212   MCHC 34.4 08/24/2023 1212   RDW 14.4 08/24/2023 1212   RDW 14.8 08/07/2017 1836   RDW 14.0 09/27/2012 1557   LYMPHSABS 4.1 (H) 08/24/2023 1212   LYMPHSABS 3.7 (H) 08/07/2017 1836   LYMPHSABS 4.4 Repeated and Verified (H) 09/27/2012 1557   MONOABS 0.5 08/24/2023 1212   MONOABS 0.5 09/27/2012 1557   EOSABS 0.1 08/24/2023 1212   EOSABS 0.1 08/07/2017 1836   BASOSABS 0.1 08/24/2023 1212   BASOSABS 0.0 08/07/2017 1836   BASOSABS 0.0 09/27/2012 1557   Recent Labs    06/29/23 1132 08/24/23 1212  HGB 14.1  13.4    CMP     Component Value Date/Time   NA 143 08/24/2023 1212   K 2.9 (L) 08/24/2023 1212   CL 104 08/24/2023 1212   CO2 32 08/24/2023 1212   GLUCOSE 92 08/24/2023 1212   BUN 8 08/24/2023 1212   CREATININE 0.66 08/24/2023 1212   CREATININE 0.73 01/26/2020 1006   CALCIUM  8.9 08/24/2023 1212   PROT 7.7 05/30/2022 1724   ALBUMIN 4.4 05/30/2022 1724   AST 13 (L) 05/30/2022 1724   AST 15 03/21/2022 0829   ALT 17 05/30/2022 1724   ALT 19 03/21/2022 0829   ALKPHOS 124 05/30/2022 1724   BILITOT 0.7 05/30/2022 1724   BILITOT 0.7 03/21/2022 0829   GFRNONAA >60 08/24/2023 1212   GFRAA >60 11/24/2016 0620      Latest Ref Rng & Units 05/30/2022    5:24 PM 05/30/2022    7:49 AM 03/21/2022    8:29 AM  Hepatic Function  Total Protein 6.5 - 8.1 g/dL 7.7  7.4  7.7   Albumin 3.5 - 5.0 g/dL 4.4  4.3  4.5   AST 15 - 41 U/L 13  18  15    ALT 0 - 44 U/L 17  18  19    Alk Phosphatase 38 - 126 U/L 124  136  154   Total Bilirubin 0.3 - 1.2 mg/dL 0.7  0.8  0.7       Current Medications:     Facility-Administered Medications Ordered in Other Visits (Endocrine & Metabolic):    lanreotide acetate  (SOMATULINE DEPOT ) injection 120 mg  Current Outpatient Medications (Cardiovascular):    amLODipine -valsartan  (EXFORGE ) 10-320 MG tablet, Take 1 tablet by mouth daily.   atorvastatin  (LIPITOR) 20 MG tablet, Take 20 mg by mouth daily.   carvedilol  (COREG ) 3.125 MG tablet, Take 1 tablet (3.125 mg total) by mouth 2 (two) times daily with a meal.   colestipol  (COLESTID ) 1 g tablet, Take 4 tablets (4 g total) by mouth 2 (two) times daily.   rosuvastatin (CRESTOR) 5 MG tablet, Take 5 mg by mouth daily.   Current Outpatient Medications (Respiratory):    albuterol  (VENTOLIN  HFA) 108 (90 Base) MCG/ACT inhaler, Inhale 2 puffs into the lungs every 4 (four) hours as needed.   Azelastine HCl 137 MCG/SPRAY SOLN, Place 137 mcg into the nose in the morning and at bedtime.   budesonide  (PULMICORT ) 0.5 MG/2ML  nebulizer solution, Take 1 mL (0.25 mg total) by nebulization 2 (two) times daily.   mometasone  (NASONEX ) 50 MCG/ACT nasal spray, Use one to two sprays in each nostril once daily   Tiotropium Bromide -Olodaterol (STIOLTO RESPIMAT ) 2.5-2.5 MCG/ACT AERS, Inhale 2 puffs into the lungs daily.   Current Outpatient Medications (Analgesics):    acetaminophen  (TYLENOL ) 325 MG tablet, Take 1-2 tablets (325-650 mg total) by mouth every 4 (four) hours as needed for mild pain.   Erenumab -aooe (AIMOVIG ) 140 MG/ML SOAJ, Inject 140mg  into the skin every 28 days   SUMAtriptan  (IMITREX ) 25 MG tablet, Take 1 tablet (25 mg total) by mouth every 2 (two) hours as needed for migraine. May repeat in 2 hours if headache persists or recurs.     Current Outpatient Medications (Other):    Na Sulfate-K Sulfate-Mg Sulfate concentrate (SUPREP BOWEL PREP KIT) 17.5-3.13-1.6 GM/177ML SOLN, Take 1 kit (354 mLs total) by mouth as directed.   amitriptyline  (ELAVIL ) 50 MG tablet, Take 1 tablet (50 mg total) by mouth at bedtime. 1/2-1 pill as needed for nausea/vomiting   diphenoxylate -atropine  (LOMOTIL ) 2.5-0.025 MG tablet, Take 2 tablets by mouth 4 (four) times daily.   gabapentin  (NEURONTIN ) 100 MG capsule, Take once a day as needed for neuropathy   levETIRAcetam  (KEPPRA ) 750 MG tablet, Take 1 tablet (750 mg total) by mouth at bedtime.   loperamide  (IMODIUM  A-D) 2 MG tablet, Take 2 mg by mouth 4 (four) times daily as needed for diarrhea or loose stools.   Magnesium  250 MG TABS, Take 250 mg by mouth at bedtime.   ondansetron  (ZOFRAN ) 4 MG tablet, Take 4 mg by mouth every 8 (eight) hours as needed for nausea.   pantoprazole  (PROTONIX ) 40 MG tablet, Take 40 mg by mouth daily.   potassium chloride  SA (KLOR-CON  M) 20 MEQ tablet, TAKE 2 TABLETS(40 MEQ) BY MOUTH TWICE DAILY FOR 7 DAYS THEN TAKE 1 TABLET BY MOUTH TWICE DAILY   zonisamide  (ZONEGRAN ) 100 MG capsule, TAKE 1 CAPSULE BY MOUTH EVERY NIGHT  No current facility-administered  medications for this visit.  Medical History:  Past Medical History:  Diagnosis Date   Acid reflux    Allergy    Anxiety    Arthritis    lower back   ASCUS (atypical squamous cells of undetermined significance) on Pap smear 03/2011   NEG HR HPV   Asthma    Cancer (HCC)    skin cancer- age 18ish   Cervical dysplasia, mild 08/2010   LGSIL colposcopy  biopsy showing koilocytotic atypia   Clotting disorder    Depression    Endometriosis    Headache(784.0)    High risk HPV infection 12/2011   Pap normal   Hypertension    Pneumonia    2016ish   Seizures (HCC)    seizure disorder   Smoker    Von Willebrand disease (HCC)    Well woman exam with routine gynecological exam 04/16/2023   Allergies:  Allergies  Allergen Reactions   Aspirin Other (See Comments)    Childhood reaction   Codeine Nausea And Vomiting    Tolerates Hydrocodone    Erythromycin Other (See Comments)    Childhood reaction, tolerate Zpak   Lactose Intolerance (Gi) Diarrhea   Nsaids Nausea And Vomiting   Penicillins Other (See Comments)    Has patient had a PCN reaction causing immediate rash, facial/tongue/throat swelling, SOB or lightheadedness with hypotension: doesn't remember childhood Reaction   Has patient had a PCN reaction causing severe rash involving mucus membranes or skin necrosis: NO  Has patient had a PCN reaction that required hospitalization NO  Has patient had a PCN reaction occurring within the last 10 years: NO  If all of the above answers are NO, then may proceed with Cephalosporin use.     Surgical History:  She  has a past surgical history that includes Nasal sinus surgery (1993); Wisdom tooth extraction (1993); Tonsillectomy; Hysteroscopy with D & C (10/14/2010); laparoscopy (04/27/2011); Colposcopy; Dilation and curettage of uterus; Laparoscopic hysterectomy (N/A, 10/14/2012); Cystoscopy (N/A, 10/14/2012); Breast biopsy (Left, 02/2016); Suboccipital craniectomy cervical  laminectomy (N/A, 11/06/2016); Ventriculoperitoneal shunt (N/A, 11/24/2016); Application of cranial navigation (N/A, 11/24/2016); Brain surgery; laparotomy (N/A, 05/05/2021); Colon resection (N/A, 05/05/2021); Cholecystectomy (N/A, 05/05/2021); Tracheostomy tube placement (N/A, 05/30/2021); and PEG placement (N/A, 05/30/2021). Family History:  Her family history includes Diabetes in her father; Endocrine tumor in her sister and another family member; Heart disease in her father and mother; Hyperlipidemia in her father and mother; Hypertension in her father and mother; Lung cancer in her maternal uncle; Lymphoma in her sister; Prostate cancer in her father; Stroke in her father.  REVIEW OF SYSTEMS  : All other systems reviewed and negative except where noted in the History of Present Illness.  PHYSICAL EXAM: BP (!) 150/90   Pulse 88   Ht 5' 3 (1.6 m)   Wt 184 lb 6.4 oz (83.6 kg)   LMP 09/16/2012   BMI 32.66 kg/m  Physical Exam   GENERAL APPEARANCE: Well nourished, in no apparent distress. HEENT: No cervical lymphadenopathy, unremarkable thyroid , sclerae anicteric, conjunctiva pink. RESPIRATORY: Respiratory effort normal, breath sounds equal bilaterally with wheezing present. CARDIO: Regular rate and rhythm with no murmurs, rubs, or gallops, peripheral pulses intact. ABDOMEN: Soft, non-distended, active bowel sounds in all four quadrants, no tenderness to palpation, no rebound, no mass appreciated. RECTAL: Declines. MUSCULOSKELETAL: Full range of motion, normal gait, without edema. SKIN: Dry, intact without rashes or lesions. No jaundice. NEURO: Alert, oriented, no focal deficits. PSYCH: Cooperative, normal mood and affect.      Alan JONELLE Coombs, PA-C 10:49 AM

## 2024-02-01 ENCOUNTER — Ambulatory Visit: Admitting: Physician Assistant

## 2024-02-01 VITALS — BP 150/90 | HR 88 | Ht 63.0 in | Wt 184.4 lb

## 2024-02-01 DIAGNOSIS — Z860101 Personal history of adenomatous and serrated colon polyps: Secondary | ICD-10-CM

## 2024-02-01 DIAGNOSIS — Z860100 Personal history of colon polyps, unspecified: Secondary | ICD-10-CM

## 2024-02-01 DIAGNOSIS — Z8719 Personal history of other diseases of the digestive system: Secondary | ICD-10-CM

## 2024-02-01 DIAGNOSIS — R197 Diarrhea, unspecified: Secondary | ICD-10-CM

## 2024-02-01 DIAGNOSIS — D3A8 Other benign neuroendocrine tumors: Secondary | ICD-10-CM

## 2024-02-01 DIAGNOSIS — Z9049 Acquired absence of other specified parts of digestive tract: Secondary | ICD-10-CM

## 2024-02-01 DIAGNOSIS — Z8619 Personal history of other infectious and parasitic diseases: Secondary | ICD-10-CM

## 2024-02-01 MED ORDER — NA SULFATE-K SULFATE-MG SULF 17.5-3.13-1.6 GM/177ML PO SOLN: 1.0000 | ORAL | 0 refills | Status: DC

## 2024-02-01 NOTE — Patient Instructions (Addendum)
 _______________________________________________________  If your blood pressure at your visit was 140/90 or greater, please contact your primary care physician to follow up on this.  _______________________________________________________  If you are age 50 or older, your body mass index should be between 23-30. Your Body mass index is 32.66 kg/m. If this is out of the aforementioned range listed, please consider follow up with your Primary Care Provider.  If you are age 22 or younger, your body mass index should be between 19-25. Your Body mass index is 32.66 kg/m. If this is out of the aformentioned range listed, please consider follow up with your Primary Care Provider.   ________________________________________________________  The [facility] providers would like to encourage you to use MYCHART to communicate with providers for non-urgent requests or questions.  Due to long hold times on the telephone, sending your provider a message by [facility] may be a faster and more efficient way to get a response.  Please allow 48 business hours for a response.  Please remember that this is for non-urgent requests.  _______________________________________________________  [facility] is using a team-based approach to care.  Your team is made up of your doctor and two to three APPS. Our APPS (Nurse Practitioners and Physician Assistants) work with your physician to ensure care continuity for you. They are fully qualified to address your health concerns and develop a treatment plan. They communicate directly with your gastroenterologist to care for you. Seeing the Advanced Practice Practitioners on your physician's team can help you by facilitating care more promptly, often allowing for earlier appointments, access to diagnostic testing, procedures, and other specialty referrals.   Your provider has requested that you go to the basement level for lab work before leaving today. Press B on the  elevator. The lab is located at the first door on the left as you exit the elevator.  Try benefiber in your coffee  You have been scheduled for a colonoscopy. Please follow written instructions given to you at your visit today.   If you use inhalers (even only as needed), please bring them with you on the day of your procedure.  DO NOT TAKE 7 DAYS PRIOR TO TEST- Trulicity (dulaglutide) Ozempic, Wegovy (semaglutide) Mounjaro, Zepbound (tirzepatide) Bydureon Bcise (exanatide extended release)  DO NOT TAKE 1 DAY PRIOR TO YOUR TEST Rybelsus (semaglutide) Adlyxin (lixisenatide) Victoza (liraglutide) Byetta (exanatide) ___________________________________________________________________________  Due to recent changes in healthcare laws, you may see the results of your imaging and laboratory studies on MyChart before your provider has had a chance to review them.  We understand that in some cases there may be results that are confusing or concerning to you. Not all laboratory results come back in the same time frame and the provider may be waiting for multiple results in order to interpret others.  Please give us  48 hours in order for your provider to thoroughly review all the results before contacting the office for clarification of your results.     FODMAP stands for fermentable oligo-, di-, mono-saccharides and polyols (1). These are the scientific terms used to classify groups of carbs that are difficult for our body to digest and that are notorious for triggering digestive symptoms like bloating, gas, loose stools and stomach pain.   You can try low FODMAP diet  - start with eliminating just one column at a time that you feel may be a trigger for you. - the table at the very bottom contains foods that are low in FODMAPs   Sometimes trying  to eliminate the FODMAP's from your diet is difficult or tricky, if you are stuggling with trying to do the elimination diet you can try an enzyme.   There is a food enzymes that you sprinkle in or on your food that helps break down the FODMAP. You can read more about the enzyme by going to this site: https://fodzyme.com/

## 2024-02-12 ENCOUNTER — Other Ambulatory Visit: Payer: Self-pay | Admitting: Physician Assistant

## 2024-02-15 ENCOUNTER — Telehealth: Payer: Self-pay

## 2024-02-15 NOTE — Telephone Encounter (Signed)
 Pt calling stated that she needs a new PA for her Aimovig 

## 2024-02-25 ENCOUNTER — Telehealth: Payer: Self-pay | Admitting: Pharmacy Technician

## 2024-02-25 ENCOUNTER — Other Ambulatory Visit (HOSPITAL_COMMUNITY): Payer: Self-pay

## 2024-02-25 NOTE — Telephone Encounter (Signed)
 Pharmacy Patient Advocate Encounter   Received notification from Pt Calls Messages that prior authorization for AIMOVIG  140MG  is required/requested.   Insurance verification completed.   The patient is insured through [facility].   Per test claim: PA required; PA submitted to above mentioned insurance via Latent Key/confirmation #/EOC [id number] Status is pending

## 2024-02-25 NOTE — Telephone Encounter (Signed)
 PA has been submitted, and telephone encounter has been created. Please see telephone encounter dated 12.29.25.

## 2024-02-29 ENCOUNTER — Encounter: Payer: Self-pay | Admitting: Gastroenterology

## 2024-02-29 ENCOUNTER — Other Ambulatory Visit

## 2024-02-29 ENCOUNTER — Ambulatory Visit: Admitting: Oncology

## 2024-03-04 ENCOUNTER — Ambulatory Visit: Admitting: Oncology

## 2024-03-04 ENCOUNTER — Other Ambulatory Visit

## 2024-03-07 ENCOUNTER — Encounter: Payer: Self-pay | Admitting: Gastroenterology

## 2024-03-07 ENCOUNTER — Ambulatory Visit: Admitting: Gastroenterology

## 2024-03-07 VITALS — BP 160/76 | HR 84 | Temp 97.7°F | Resp 14 | Ht 63.0 in | Wt 184.0 lb

## 2024-03-07 DIAGNOSIS — K621 Rectal polyp: Secondary | ICD-10-CM

## 2024-03-07 DIAGNOSIS — D3A8 Other benign neuroendocrine tumors: Secondary | ICD-10-CM

## 2024-03-07 DIAGNOSIS — K623 Rectal prolapse: Secondary | ICD-10-CM

## 2024-03-07 DIAGNOSIS — R194 Change in bowel habit: Secondary | ICD-10-CM

## 2024-03-07 DIAGNOSIS — Z9049 Acquired absence of other specified parts of digestive tract: Secondary | ICD-10-CM

## 2024-03-07 DIAGNOSIS — R197 Diarrhea, unspecified: Secondary | ICD-10-CM | POA: Diagnosis present

## 2024-03-07 DIAGNOSIS — D125 Benign neoplasm of sigmoid colon: Secondary | ICD-10-CM | POA: Diagnosis not present

## 2024-03-07 DIAGNOSIS — Z860100 Personal history of colon polyps, unspecified: Secondary | ICD-10-CM

## 2024-03-07 DIAGNOSIS — D128 Benign neoplasm of rectum: Secondary | ICD-10-CM

## 2024-03-07 MED ORDER — SODIUM CHLORIDE 0.9 % IV SOLN: 500.0000 mL | Freq: Once | INTRAVENOUS | Status: DC

## 2024-03-07 NOTE — Progress Notes (Signed)
 Report given to PACU, vss

## 2024-03-07 NOTE — Progress Notes (Signed)
 Called to room to assist during endoscopic procedure.  Patient ID and intended procedure confirmed with present staff. Received instructions for my participation in the procedure from the performing physician.

## 2024-03-07 NOTE — Patient Instructions (Addendum)
 Handout provided about polyps.  Resume previous diet.  Continue present medications.  Repeat colonoscopy for surveillance based on pathology results.  Return to GI office PRN.   YOU HAD AN ENDOSCOPIC PROCEDURE TODAY AT THE [facility]:   Refer to the procedure report that was given to you for any specific questions about what was found during the examination.  If the procedure report does not answer your questions, please call your gastroenterologist to clarify.  If you requested that your care partner not be given the details of your procedure findings, then the procedure report has been included in a sealed envelope for you to review at your convenience later.  YOU SHOULD EXPECT: Some feelings of bloating in the abdomen. Passage of more gas than usual.  Walking can help get rid of the air that was put into your GI tract during the procedure and reduce the bloating. If you had a lower endoscopy (such as a colonoscopy or flexible sigmoidoscopy) you may notice spotting of blood in your stool or on the toilet paper. If you underwent a bowel prep for your procedure, you may not have a normal bowel movement for a few days.  Please Note:  You might notice some irritation and congestion in your nose or some drainage.  This is from the oxygen  used during your procedure.  There is no need for concern and it should clear up in a day or so.  SYMPTOMS TO REPORT IMMEDIATELY:  Following lower endoscopy (colonoscopy or flexible sigmoidoscopy):  Excessive amounts of blood in the stool  Significant tenderness or worsening of abdominal pains  Swelling of the abdomen that is new, acute  Fever of 100F or higher  For urgent or emergent issues, a gastroenterologist can be reached at any hour by calling [phone]. Do not use MyChart messaging for urgent concerns.    DIET:  We do recommend a small meal at first, but then you may proceed to your regular diet.  Drink plenty of fluids but you  should avoid alcoholic beverages for 24 hours.  ACTIVITY:  You should plan to take it easy for the rest of today and you should NOT DRIVE or use heavy machinery until tomorrow (because of the sedation medicines used during the test).    FOLLOW UP: Our staff will call the number listed on your records the next business day following your procedure.  We will call around 7:15- 8:00 am to check on you and address any questions or concerns that you may have regarding the information given to you following your procedure. If we do not reach you, we will leave a message.     If any biopsies were taken you will be contacted by phone or by letter within the next 1-3 weeks.  Please call us  at [phone] if you have not heard about the biopsies in 3 weeks.    SIGNATURES/CONFIDENTIALITY: You and/or your care partner have signed paperwork which will be entered into your electronic medical record.  These signatures attest to the fact that that the information above on your After Visit Summary has been reviewed and is understood.  Full responsibility of the confidentiality of this discharge information lies with you and/or your care-partner.

## 2024-03-07 NOTE — Progress Notes (Signed)
 "   GASTROENTEROLOGY PROCEDURE H&P NOTE   Primary Care Physician: Gerome Brunet, DO    Reason for Procedure:  Diarrhea, history of colon polyps, history of C. difficile, history of small bowel NET  Plan:    Colonoscopy  Patient is appropriate for endoscopic procedure(s) in the ambulatory ([facility]) setting.  The nature of the procedure, as well as the risks, benefits, and alternatives were carefully and thoroughly reviewed with the patient. Ample time for discussion and questions allowed. The patient understood, was satisfied, and agreed to proceed. I personally addressed all patient questions and concerns.     HPI: Cassidy Tran is a 49 y.o. female who presents for colonoscopy for evaluation of diarrhea along with polyp surveillance.  - 03/11/2021: Colonoscopy showed perianal skin tags, 15 mm polyp in the rectum (TVA), nonbleeding angiodysplastic lesion, internal hemorrhoid.  Polypoid lesion terminal ileum (PATH: well-differentiated neuroendocrine grade 1 tumor).  Random colon biopsies negative macroscopic colitis.  - 05/05/2021: Right hemicolectomy with ileocolonic anastomosis and resection of 50 cm of small bowel (path: Well differentiated neuroendocrine tumor).  Cholecystectomy.  Prolonged hospital course including trach/PEG MDCT and rehab facility.  Elevated chromogranin A at 463, treated with Somatuline and follows with Dr. Timmy - 08/17/2021: PET CT dotatate: No residual neuroendocrine tumor, no metastasis - 10/10/2021: GI follow-up appointment: Some improvement with Creon .  Recommended colestipol  pills, low-fat diet with frequent meals.  Consideration for SIBO testing - 11/16/2021: Chromogranin A 17.1 - 05/2022: Normal 5 HIAA, fasting gastrin, vasoactive intestinal peptide - 07/2023: Chromogranin A 44.9  Past Medical History:  Diagnosis Date   Acid reflux    Allergy    Anxiety    Arthritis    lower back   ASCUS (atypical squamous cells of undetermined significance) on Pap smear  03/2011   NEG HR HPV   Asthma    Cancer (HCC)    skin cancer- age 18ish   Cervical dysplasia, mild 08/2010   LGSIL colposcopy biopsy showing koilocytotic atypia   Clotting disorder    Depression    Endometriosis    Headache(784.0)    High risk HPV infection 12/2011   Pap normal   Hypertension    Pneumonia    2016ish   Seizures (HCC)    seizure disorder   Smoker    Von Willebrand disease (HCC)    Well woman exam with routine gynecological exam 04/16/2023    Past Surgical History:  Procedure Laterality Date   APPLICATION OF CRANIAL NAVIGATION N/A 11/24/2016   Procedure: APPLICATION OF CRANIAL NAVIGATION;  Surgeon: Colon Shove, MD;  Location: [facility];  Service: Neurosurgery;  Laterality: N/A;   BRAIN SURGERY     BREAST BIOPSY Left 02/2016   CHOLECYSTECTOMY N/A 05/05/2021   Procedure: CHOLECYSTECTOMY;  Surgeon: Paola Dreama SAILOR, MD;  Location: [facility];  Service: General;  Laterality: N/A;   COLON RESECTION N/A 05/05/2021   Procedure: RIGHT HEMICOLECTOMY;  Surgeon: Paola Dreama SAILOR, MD;  Location: [facility];  Service: General;  Laterality: N/A;   COLPOSCOPY     CYSTOSCOPY N/A 10/14/2012   Procedure: CYSTOSCOPY;  Surgeon: Evalene SHAUNNA Organ, MD;  Location: [facility];  Service: Gynecology;  Laterality: N/A;   DILATION AND CURETTAGE OF UTERUS     HYSTEROSCOPY WITH D & C  10/14/2010   Procedure: DILATATION AND CURETTAGE (D&C) /HYSTEROSCOPY;  Surgeon: Evalene SHAUNNA Organ, MD;  Location: [facility];  Service: Gynecology;  Laterality: N/A;   LAPAROSCOPIC HYSTERECTOMY N/A 10/14/2012   Procedure: HYSTERECTOMY TOTAL LAPAROSCOPIC;  Surgeon: Evalene SHAUNNA  Fontaine, MD;  Location: [facility];  Service: Gynecology;  Laterality: N/A;  CPT [id number]  2 1/2 hours  Dr. Curlee Guan to assist.   LAPAROSCOPY  04/27/2011   Procedure: LAPAROSCOPY OPERATIVE;  Surgeon: Evalene SHAUNNA Organ, MD;  Location: [facility];  Service: Gynecology;  Laterality: N/A;  removal right cyst , lysis of adhesions, biopsy of peritoneum    LAPAROTOMY N/A 05/05/2021   Procedure: EXPLORATORY LAPAROTOMY;  Surgeon: Paola Dreama SAILOR, MD;  Location: [facility];  Service: General;  Laterality: N/A;   NASAL SINUS SURGERY  1993   IN [place], [place]   PEG PLACEMENT N/A 05/30/2021   Procedure: PERCUTANEOUS ENDOSCOPIC GASTROSTOMY (PEG) PLACEMENT;  Surgeon: Paola Dreama SAILOR, MD;  Location: [facility];  Service: General;  Laterality: N/A;   SUBOCCIPITAL CRANIECTOMY CERVICAL LAMINECTOMY N/A 11/06/2016   Procedure: Suboccipital decompression for chiari malformation;  Surgeon: Colon Shove, MD;  [facility];  Service: Neurosurgery;  Laterality: N/A;   TONSILLECTOMY     TRACHEOSTOMY TUBE PLACEMENT N/A 05/30/2021   Procedure: TRACHEOSTOMY;  Surgeon: Paola Dreama SAILOR, MD;  Location[facility];  Service: General;  Laterality: N/A;   VENTRICULOPERITONEAL SHUNT N/A 11/24/2016   Procedure: SHUNT INSERTION VENTRICULAR-PERITONEAL with Brainlab;  Surgeon: Colon Shove, MD;  [facility];  Service: Neurosurgery;  Laterality: N/A;  right side approach   WISDOM TOOTH EXTRACTION  1993    Prior to Admission medications  Medication Sig Start Date End Date Taking? Authorizing Provider  acetaminophen  (TYLENOL ) 325 MG tablet Take 1-2 tablets (325-650 mg total) by mouth every 4 (four) hours as needed for mild pain. 06/24/21   Setzer, Sandra J, PA-C  albuterol  (VENTOLIN  HFA) 108 (90 Base) MCG/ACT inhaler Inhale 2 puffs into the lungs every 4 (four) hours as needed. 12/04/23   [provider]  amitriptyline  (ELAVIL ) 50 MG tablet TAKE 1/2- 1 TABLET BY MOUTH EVERY DAY AS NEEDED NAUSEA OR VOMITING 02/12/24   Collier, Amanda R, PA-C  amLODipine -valsartan  (EXFORGE ) 10-320 MG tablet Take 1 tablet by mouth daily.    [provider]  atorvastatin  (LIPITOR) 20 MG tablet Take 20 mg by mouth daily. 03/14/21   [provider]  Azelastine HCl 137 MCG/SPRAY SOLN Place 137 mcg into the nose in the morning and at bedtime. 11/28/21   [provider]  budesonide   (PULMICORT ) 0.5 MG/2ML nebulizer solution Take 1 mL (0.25 mg total) by nebulization 2 (two) times daily. 06/24/21   Setzer, Sandra J, PA-C  carvedilol  (COREG ) 3.125 MG tablet Take 1 tablet (3.125 mg total) by mouth 2 (two) times daily with a meal. 06/24/21   Setzer, Nena PARAS, PA-C  colestipol  (COLESTID ) 1 g tablet Take 4 tablets (4 g total) by mouth 2 (two) times daily. 07/09/23   Aarohi Redditt V, DO  diphenoxylate -atropine  (LOMOTIL ) 2.5-0.025 MG tablet Take 2 tablets by mouth 4 (four) times daily. 06/24/21   Setzer, Sandra J, PA-C  Erenumab -aooe (AIMOVIG ) 140 MG/ML SOAJ Inject 140mg  into the skin every 28 days 11/06/23   Georjean Darice HERO, MD  gabapentin  (NEURONTIN ) 100 MG capsule Take once a day as needed for neuropathy 07/06/23   Georjean Darice HERO, MD  levETIRAcetam  (KEPPRA ) 750 MG tablet Take 1 tablet (750 mg total) by mouth at bedtime. 11/06/23   Georjean Darice HERO, MD  loperamide  (IMODIUM  A-D) 2 MG tablet Take 2 mg by mouth 4 (four) times daily as needed for diarrhea or loose stools.    [provider]  Magnesium  250 MG TABS Take 250 mg by mouth  at bedtime.    [provider]  mometasone  (NASONEX ) 50 MCG/ACT nasal spray Use one to two sprays in each nostril once daily 09/28/15   Kozlow, Eric J, MD  Na Sulfate-K Sulfate-Mg Sulfate concentrate (SUPREP BOWEL PREP KIT) 17.5-3.13-1.6 GM/177ML SOLN Take 1 kit (354 mLs total) by mouth as directed. 02/01/24   Craig Alan SAUNDERS, PA-C  ondansetron  (ZOFRAN ) 4 MG tablet Take 4 mg by mouth every 8 (eight) hours as needed for nausea. 12/04/23   [provider]  pantoprazole  (PROTONIX ) 40 MG tablet Take 40 mg by mouth daily.    [provider]  potassium chloride  SA (KLOR-CON  M) 20 MEQ tablet TAKE 2 TABLETS(40 MEQ) BY MOUTH TWICE DAILY FOR 7 DAYS THEN TAKE 1 TABLET BY MOUTH TWICE DAILY 01/20/22   Timmy Maude SAUNDERS, MD  rosuvastatin (CRESTOR) 5 MG tablet Take 5 mg by mouth daily. 01/24/21   [provider]  SUMAtriptan  (IMITREX ) 25 MG  tablet Take 1 tablet (25 mg total) by mouth every 2 (two) hours as needed for migraine. May repeat in 2 hours if headache persists or recurs. 11/06/23   Georjean Darice HERO, MD  Tiotropium Bromide -Olodaterol (STIOLTO RESPIMAT ) 2.5-2.5 MCG/ACT AERS Inhale 2 puffs into the lungs daily. 06/24/21   Setzer, Sandra J, PA-C  zonisamide  (ZONEGRAN ) 100 MG capsule TAKE 1 CAPSULE BY MOUTH EVERY NIGHT 11/06/23   Georjean Darice HERO, MD    Current Outpatient Medications  Medication Sig Dispense Refill   acetaminophen  (TYLENOL ) 325 MG tablet Take 1-2 tablets (325-650 mg total) by mouth every 4 (four) hours as needed for mild pain.     albuterol  (VENTOLIN  HFA) 108 (90 Base) MCG/ACT inhaler Inhale 2 puffs into the lungs every 4 (four) hours as needed.     amitriptyline  (ELAVIL ) 50 MG tablet TAKE 1/2- 1 TABLET BY MOUTH EVERY DAY AS NEEDED NAUSEA OR VOMITING 30 tablet 2   amLODipine -valsartan  (EXFORGE ) 10-320 MG tablet Take 1 tablet by mouth daily.     atorvastatin  (LIPITOR) 20 MG tablet Take 20 mg by mouth daily.     Azelastine HCl 137 MCG/SPRAY SOLN Place 137 mcg into the nose in the morning and at bedtime.     budesonide  (PULMICORT ) 0.5 MG/2ML nebulizer solution Take 1 mL (0.25 mg total) by nebulization 2 (two) times daily. 60 mL 0   carvedilol  (COREG ) 3.125 MG tablet Take 1 tablet (3.125 mg total) by mouth 2 (two) times daily with a meal. 60 tablet 0   colestipol  (COLESTID ) 1 g tablet Take 4 tablets (4 g total) by mouth 2 (two) times daily. 240 tablet 6   diphenoxylate -atropine  (LOMOTIL ) 2.5-0.025 MG tablet Take 2 tablets by mouth 4 (four) times daily. 30 tablet 0   Erenumab -aooe (AIMOVIG ) 140 MG/ML SOAJ Inject 140mg  into the skin every 28 days 1.12 mL 11   gabapentin  (NEURONTIN ) 100 MG capsule Take once a day as needed for neuropathy 90 capsule 3   levETIRAcetam  (KEPPRA ) 750 MG tablet Take 1 tablet (750 mg total) by mouth at bedtime. 90 tablet 3   loperamide  (IMODIUM  A-D) 2 MG tablet Take 2 mg by mouth 4 (four) times  daily as needed for diarrhea or loose stools.     Magnesium  250 MG TABS Take 250 mg by mouth at bedtime.     mometasone  (NASONEX ) 50 MCG/ACT nasal spray Use one to two sprays in each nostril once daily 17 g 5   Na Sulfate-K Sulfate-Mg Sulfate concentrate (SUPREP BOWEL PREP KIT) 17.5-3.13-1.6 GM/177ML SOLN Take 1 kit (  354 mLs total) by mouth as directed. 324 mL 0   ondansetron  (ZOFRAN ) 4 MG tablet Take 4 mg by mouth every 8 (eight) hours as needed for nausea.     pantoprazole  (PROTONIX ) 40 MG tablet Take 40 mg by mouth daily.     potassium chloride  SA (KLOR-CON  M) 20 MEQ tablet TAKE 2 TABLETS(40 MEQ) BY MOUTH TWICE DAILY FOR 7 DAYS THEN TAKE 1 TABLET BY MOUTH TWICE DAILY 120 tablet 2   rosuvastatin (CRESTOR) 5 MG tablet Take 5 mg by mouth daily.     SUMAtriptan  (IMITREX ) 25 MG tablet Take 1 tablet (25 mg total) by mouth every 2 (two) hours as needed for migraine. May repeat in 2 hours if headache persists or recurs. 10 tablet 6   Tiotropium Bromide -Olodaterol (STIOLTO RESPIMAT ) 2.5-2.5 MCG/ACT AERS Inhale 2 puffs into the lungs daily. 4 g 0   zonisamide  (ZONEGRAN ) 100 MG capsule TAKE 1 CAPSULE BY MOUTH EVERY NIGHT 90 capsule 3   No current facility-administered medications for this visit.   Facility-Administered Medications Ordered in Other Visits  Medication Dose Route Frequency Provider Last Rate Last Admin   lanreotide acetate  (SOMATULINE DEPOT ) injection 120 mg  120 mg Subcutaneous Once Ennever, Peter R, MD        Allergies as of 03/07/2024 - Review Complete 12/12/2023  Allergen Reaction Noted   Aspirin Other (See Comments) 09/14/2010   Codeine Nausea And Vomiting 09/14/2010   Erythromycin Other (See Comments) 09/14/2010   Lactose intolerance (gi) Diarrhea 07/17/2016   Nsaids Nausea And Vomiting 01/10/2021   Penicillins Other (See Comments) 09/14/2010    Family History  Problem Relation Age of Onset   Hypertension Mother    Heart disease Mother    Hyperlipidemia Mother     Diabetes Father    Hypertension Father    Heart disease Father    Hyperlipidemia Father    Stroke Father    Prostate cancer Father    Lymphoma Sister        NON-HODGKINS LYMPHOMA   Endocrine tumor Sister        neuroendocrine tumor   Lung cancer Maternal Uncle        small cell   Endocrine tumor Other        pateral first cousin once removed, neuroendocrine tumor   Colon cancer Neg Hx    Rectal cancer Neg Hx    Esophageal cancer Neg Hx     Social History   Socioeconomic History   Marital status: Married    Spouse name: Not on file   Number of children: Not on file   Years of education: Not on file   Highest education level: Not on file  Occupational History   Occupation: Customer Service  Tobacco Use   Smoking status: Some Days    Current packs/day: 0.00    Average packs/day: 0.5 packs/day for 20.0 years (10.0 ttl pk-yrs)    Types: Cigarettes    Start date: 05/05/2001    Last attempt to quit: 05/05/2021    Years since quitting: 2.8   Smokeless tobacco: Never  Vaping Use   Vaping status: Never Used  Substance and Sexual Activity   Alcohol use: Not Currently    Comment: quit since March   Drug use: No   Sexual activity: Yes    Birth control/protection: Surgical    Comment: Hyst, First IC @ 16 y/o, >5 Partners  Other Topics Concern   Not on file  Social History Narrative   Right handed  Live with husband and dog in a one level home.    Caffeine  rarely   Social Drivers of Health   Tobacco Use: High Risk (12/12/2023)   Patient History    Smoking Tobacco Use: Some Days    Smokeless Tobacco Use: Never    Passive Exposure: Not on file  Financial Resource Strain: Not on file  Food Insecurity: No Food Insecurity (06/26/2022)   Hunger Vital Sign    Worried About Running Out of Food in the Last Year: Never true    Ran Out of Food in the Last Year: Never true  Transportation Needs: No Transportation Needs (06/26/2022)   PRAPARE - Scientist, Research (physical Sciences) (Medical): No    Lack of Transportation (Non-Medical): No  Physical Activity: Not on file  Stress: Not on file  Social Connections: Not on file  Intimate Partner Violence: Not At Risk (06/26/2022)   Humiliation, Afraid, Rape, and Kick questionnaire    Fear of Current or Ex-Partner: No    Emotionally Abused: No    Physically Abused: No    Sexually Abused: No  Depression (PHQ2-9): Medium Risk (04/16/2023)   Depression (PHQ2-9)    PHQ-2 Score: 7  Alcohol Screen: Not on file  Housing: Low Risk (06/26/2022)   Housing    Last Housing Risk Score: 0  Utilities: Not At Risk (06/26/2022)   AHC Utilities    Threatened with loss of utilities: No  Health Literacy: Not on file    Physical Exam: Vital signs in last 24 hours: @LMP  09/16/2012  GEN: NAD EYE: Sclerae anicteric ENT: MMM CV: Non-tachycardic Pulm: CTA b/l GI: Soft, NT/ND NEURO:  Alert & Oriented x 3   Cassidy Flatter, DO [facility]   03/07/2024 12:33 PM  "

## 2024-03-07 NOTE — Op Note (Signed)
 [facility] Patient Name: Cassidy Tran Procedure Date: 03/07/2024 1:25 PM MRN: [id number] Endoscopist: Sandor Flatter , MD, [id number] Age: 49 Referring MD:  Date of Birth: 1975/08/05 Gender: Female Account #: [id number] Procedure:                Colonoscopy Indications:              Change in bowel habits, Diarrhea                           History of colon polyps                           History of small bowel neuroendocrine tumor s/p                            right hemicolectomy Medicines:                Monitored Anesthesia Care Procedure:                Pre-Anesthesia Assessment:                           - Prior to the procedure, a History and Physical                            was performed, and patient medications and                            allergies were reviewed. The patient's tolerance of                            previous anesthesia was also reviewed. The risks                            and benefits of the procedure and the sedation                            options and risks were discussed with the patient.                            All questions were answered, and informed consent                            was obtained. Prior Anticoagulants: The patient has                            taken no anticoagulant or antiplatelet agents. ASA                            Grade Assessment: III - A patient with severe                            systemic disease. After reviewing the risks and  benefits, the patient was deemed in satisfactory                            condition to undergo the procedure.                           After obtaining informed consent, the colonoscope                            was passed under direct vision. Throughout the                            procedure, the patient's blood pressure, pulse, and                            oxygen  saturations were monitored continuously. The                             Olympus CF-HQ190L ([id number]) Colonoscope was                            introduced through the anus and advanced to the the                            ileocolonic anastomosis. The colonoscopy was                            performed without difficulty. The patient tolerated                            the procedure well. The quality of the bowel                            preparation was good. The rectum, Ileocolonic                            anastomosis, neoterminal ileum were photographed. Scope In: 1:32:06 PM Scope Out: 1:50:36 PM Scope Withdrawal Time: 0 hours 16 minutes 16 seconds  Total Procedure Duration: 0 hours 18 minutes 30 seconds  Findings:                 The perianal and digital rectal examinations were                            normal.                           A 3 mm polyp was found in the sigmoid colon. The                            polyp was sessile. The polyp was removed with a                            cold snare. Resection and retrieval were complete.  Estimated blood loss was minimal.                           Three sessile polyps were found in the rectum. The                            polyps were 2 to 3 mm in size. These polyps were                            removed with a cold snare. Resection and retrieval                            were complete. Estimated blood loss was minimal.                           There was evidence of a prior end-to-side                            ileo-colonic anastomosis in the transverse colon.                            This was patent and was characterized by healthy                            appearing mucosa. The anastomosis was traversed.                           Normal mucosa was found in the entire colon.                            Biopsies for histology were taken with a cold                            forceps throughout the colon for evaluation of                            microscopic colitis.  Estimated blood loss was                            minimal.                           The retroflexed view of the distal rectum and anal                            verge was normal and showed no anal or rectal                            abnormalities.                           The neo-terminal ileum appeared normal. Complications:            No immediate complications. Estimated Blood Loss:     Estimated blood  loss was minimal. Impression:               - One 3 mm polyp in the sigmoid colon, removed with                            a cold snare. Resected and retrieved.                           - Three 2 to 3 mm polyps in the rectum, removed                            with a cold snare. Resected and retrieved.                           - Patent end-to-side ileo-colonic anastomosis,                            characterized by healthy appearing mucosa.                           - Normal mucosa in the entire examined colon.                            Biopsied.                           - The distal rectum and anal verge are normal on                            retroflexion view.                           - The examined portion of the ileum was normal. Recommendation:           - Patient has a contact number available for                            emergencies. The signs and symptoms of potential                            delayed complications were discussed with the                            patient. Return to normal activities tomorrow.                            Written discharge instructions were provided to the                            patient.                           - Resume previous diet.                           - Continue present medications.                           -  Await pathology results.                           - Repeat colonoscopy for surveillance based on                            pathology results.                           - Return to GI office PRN. Sandor Flatter, MD 03/07/2024 1:59:05 PM

## 2024-03-10 ENCOUNTER — Telehealth: Payer: Self-pay

## 2024-03-10 NOTE — Telephone Encounter (Signed)
" °  Follow up Call-     03/07/2024   12:46 PM  Call back number  Post procedure Call Back phone  # [phone]  Permission to leave phone message Yes     Patient questions:  Do you have a fever, pain , or abdominal swelling? No. Pain Score  0 *  Have you tolerated food without any problems? Yes.    Have you been able to return to your normal activities? Yes.    Do you have any questions about your discharge instructions: Diet   No. Medications  No. Follow up visit  No.  Do you have questions or concerns about your Care? No.  Actions: * If pain score is 4 or above: No action needed, pain <4.   "

## 2024-03-11 ENCOUNTER — Ambulatory Visit: Admitting: Neurology

## 2024-03-11 ENCOUNTER — Encounter: Payer: Self-pay | Admitting: Neurology

## 2024-03-11 VITALS — BP 171/91 | HR 100 | Ht 63.0 in | Wt 189.8 lb

## 2024-03-11 DIAGNOSIS — G40309 Generalized idiopathic epilepsy and epileptic syndromes, not intractable, without status epilepticus: Secondary | ICD-10-CM

## 2024-03-11 DIAGNOSIS — G43009 Migraine without aura, not intractable, without status migrainosus: Secondary | ICD-10-CM

## 2024-03-11 MED ORDER — ZONISAMIDE 100 MG PO CAPS: ORAL_CAPSULE | ORAL | 3 refills | Status: AC

## 2024-03-11 MED ORDER — AIMOVIG 140 MG/ML ~~LOC~~ SOAJ: SUBCUTANEOUS | 11 refills | Status: AC

## 2024-03-11 MED ORDER — LEVETIRACETAM 750 MG PO TABS: 750.0000 mg | ORAL_TABLET | Freq: Every day | ORAL | 3 refills | Status: AC

## 2024-03-11 MED ORDER — SUMATRIPTAN SUCCINATE 25 MG PO TABS: 25.0000 mg | ORAL_TABLET | ORAL | 6 refills | Status: AC | PRN

## 2024-03-11 NOTE — Patient Instructions (Signed)
 Good to see you!  Increase Zonisamide  100mg : take 2 capsules every night  2. Continue Levetiracetam  750mg  every night  3. Continue Aimovig  injection every month, refills also sent for as needed sumatriptan   4. Keep a calendar of the body jerks as we increase Zonisamide   5. Follow-up in 4 months, call for any changes   Seizure Precautions: 1. If medication has been prescribed for you to prevent seizures, take it exactly as directed.  Do not stop taking the medicine without talking to your doctor first, even if you have not had a seizure in a long time.   2. Avoid activities in which a seizure would cause danger to yourself or to others.  Don't operate dangerous machinery, swim alone, or climb in high or dangerous places, such as on ladders, roofs, or girders.  Do not drive unless your doctor says you may.  3. If you have any warning that you may have a seizure, lay down in a safe place where you can't hurt yourself.    4.  No driving for 6 months from last seizure, as per [place]  state law.   Please refer to the following link on the [employer]ile[employer] of [employer]'s website for more information: http://www.epilepsyfoundation.org/answerplace/Social/driving/drivingu.cfm   5.  Maintain good sleep hygiene. Avoid alcohol.  6.  Contact your doctor if you have any problems that may be related to the medicine you are taking.  7.  Call 911 and bring the patient back to the ED if:        A.  The seizure lasts longer than 5 minutes.       B.  The patient doesn't awaken shortly after the seizure  C.  The patient has new problems such as difficulty seeing, speaking or moving  D.  The patient was injured during the seizure  E.  The patient has a temperature over 102 F (39C)  F.  The patient vomited and now is having trouble breathing

## 2024-03-11 NOTE — Progress Notes (Signed)
 "  NEUROLOGY FOLLOW UP OFFICE NOTE  Cassidy Tran [id number] 30-Oct-1975  Discussed the use of AI scribe software for clinical note transcription with the patient, who gave verbal consent to proceed.  History of Present Illness I had the pleasure of seeing Cassidy Tran in follow-up in the neurology clinic on 03/11/2024.  The patient was last seen 4 months ago for JME, neuropathy, and migraines. She is again accompanied by her husband who helps supplement the history today.  Records and images were personally reviewed where available.  No convulsions since 2011 on Zonisamide  100mg  at bedtime and Levetiracetam  750mg  at bedtime, no side effects. No staring/unresponsive episodes, gaps in time, focal numbness/tingling/weakness. She has myoclonic jerks in the morning around 1-4 times a week, worse when sleep-deprived. A cup of coffee once jerked out of her hands. She keeps an extra Levetiracetam  to take if needed.   She has been on Aimovig  for migraine prevention. Initially, there were no breakthrough headaches in the first month, but a few occurred in the second month, with more as time progressed. These headaches are effectively managed with sumatriptan . No side effects from Aimovig . She is on amitriptyline  at night for GI issues, which is likely contributing to dry mouth.   Her sleep quality is poor, characterized by frequent awakenings to use the bathroom due to GI issues that have been ongoing for 4 years since a significant hospital stay where a large portion of her intestine was removed.    History on Initial Assessment 08/02/2021: This is a pleasant 49 year old right-handed woman with a history of von Willebrand disease, neuroendocrine tumor, Juvenile Myoclonic Epilepsy since age 63, presenting to establish care for seizures. Records were reviewed. Seizures are usually preceded by myoclonic jerks. She reports longest seizure-free interval was 25 years, then she had a GTC in 2009, 2010, and 2011.  She and her husband deny any convulsions since 2011. She was being followed at [facility], last visit was in 2017. At that time she was on Zonisamide  100mg  BID and Depakote  ER 2000mg  qhs. She has not seen a neurologist since then. She started having diarrhea in 08/2020 and was found to have biopsy-proven neuroendocrine tumor of the terminal ileum. She underwent GI surgery (exlap, R hemicolectomy, cholecystectomy) in 05/05/21, course was complicated by respiratory failure requiring intubation and trach. She had altered mental status with hyperammonemia (104) felt to be due to Depakote . She was seen by Neurology during her admission and Depakote  was stopped. She was on IV Levetiracetam  and had continuous EEG monitoring for 48 hours with diffuse background slowing, no epileptiform discharges or electrographic seizures. She was discharged on Levetiracetam  750mg  BID and Zonisamide  100mg  BID, however states that the LEV was stopped and that she has only been taking Zonisamide  100mg  qhs. She and her husband deny any staring/unresponsive episodes, gaps in time, olfactory/gustatory hallucinations. She mostly has myoclonic jerks if she forgets to take her medication or when sleep deprived. She could not remember how she was taking the Zonisamide  prior to hospitalization.  She was in inpatient rehab after hospitalization from 4/9 to 06/25/21. She reports that she was dropped by a nurse tech and fell on her feet, and since then she has had bilateral foot drop R>L and constant burning/tingling on the dorsum of both feet and below her knees. She reported right foot pain and had an xray showing a subacute right 5th metatarsal fracture. She reports that Gabapentin  100mg  TID was started however she ran out of medication, it  does help with the paresthesias. She ambulates with a walker. She denies any symptoms of neuropathy prior to her hospitalization. She has also been having numbness in the last 2 fingers on her right hand  since hospitalization. She has tightness in her neck and back. She continues to deal with frequent diarrhea, no urinary issues. The diarrhea is causing a lot of anxiety, keeping her up at night due to frequent BMs. She has a history of VP shunt secondary to Chiari malformation, she was seen by Neurosurgery during her admission and had a lumbar puncture with normal CSF. She continues to follow-up with Neurosurgery for the shunt.   Epilepsy Risk Factors:  She had a normal birth and early development.  There is no history of febrile convulsions, CNS infections such as meningitis/encephalitis, significant traumatic brain injury, neurosurgical procedures, or family history of seizures.  Prior ASMs: Topiramate, Depakote  (hyperammonemia) Prior headache preventative medications: amitriptyline , Topiramate, Depakote   Diagnostic Data: EEGs: EEG in 2011 reported intermittent 4-5 Hz spike and polyspike and wave complexes MRI: MRI brain in 2015 no acute changes, hippocampi symmetric. At that time, note of cerebellar tonsillar ectopia measuring 4mm.  Head CT without contrast 05/2021 VP shunt in the occipital region on the right terminates in the frontal lobe on left, ventricles stable, no hydrocephalus. Prior Chiari decompression with stable appearance of foramen magnum  PAST MEDICAL HISTORY: Past Medical History:  Diagnosis Date   Acid reflux    Allergy    Anxiety    Arthritis    lower back   ASCUS (atypical squamous cells of undetermined significance) on Pap smear 03/2011   NEG HR HPV   Asthma    Cancer (HCC)    skin cancer- age 18ish   Cervical dysplasia, mild 08/2010   LGSIL colposcopy biopsy showing koilocytotic atypia   Clotting disorder    Depression    Endometriosis    Headache(784.0)    High risk HPV infection 12/2011   Pap normal   Hypertension    Pneumonia    2016ish   Seizures (HCC)    seizure disorder   Smoker    Von Willebrand disease (HCC)    Well woman exam with routine  gynecological exam 04/16/2023    MEDICATIONS: Medications Ordered Prior to Encounter[1]  ALLERGIES: Allergies[2]  FAMILY HISTORY: Family History  Problem Relation Age of Onset   Hypertension Mother    Heart disease Mother    Hyperlipidemia Mother    Diabetes Father    Hypertension Father    Heart disease Father    Hyperlipidemia Father    Stroke Father    Prostate cancer Father    Lymphoma Sister        NON-HODGKINS LYMPHOMA   Endocrine tumor Sister        neuroendocrine tumor   Lung cancer Maternal Uncle        small cell   Endocrine tumor Other        pateral first cousin once removed, neuroendocrine tumor   Colon cancer Neg Hx    Rectal cancer Neg Hx    Esophageal cancer Neg Hx     SOCIAL HISTORY: Social History   Socioeconomic History   Marital status: Married    Spouse name: Not on file   Number of children: Not on file   Years of education: Not on file   Highest education level: Not on file  Occupational History   Occupation: Customer Service  Tobacco Use   Smoking status: Some Days  Current packs/day: 0.00    Average packs/day: 0.5 packs/day for 20.0 years (10.0 ttl pk-yrs)    Types: Cigarettes    Start date: 05/05/2001    Last attempt to quit: 05/05/2021    Years since quitting: 2.8   Smokeless tobacco: Never  Vaping Use   Vaping status: Never Used  Substance and Sexual Activity   Alcohol use: Not Currently    Comment: quit since March   Drug use: No   Sexual activity: Yes    Birth control/protection: Surgical    Comment: Hyst, First IC @ 18 y/o, >5 Partners  Other Topics Concern   Not on file  Social History Narrative   Right handed    Live with husband and dog in a one level home.    Caffeine  rarely   Social Drivers of Health   Tobacco Use: High Risk (03/07/2024)   Patient History    Smoking Tobacco Use: Some Days    Smokeless Tobacco Use: Never    Passive Exposure: Not on file  Financial Resource Strain: Not on file  Food Insecurity:  No Food Insecurity (06/26/2022)   Hunger Vital Sign    Worried About Running Out of Food in the Last Year: Never true    Ran Out of Food in the Last Year: Never true  Transportation Needs: No Transportation Needs (06/26/2022)   PRAPARE - Administrator, Civil Service (Medical): No    Lack of Transportation (Non-Medical): No  Physical Activity: Not on file  Stress: Not on file  Social Connections: Not on file  Intimate Partner Violence: Not At Risk (06/26/2022)   Humiliation, Afraid, Rape, and Kick questionnaire    Fear of Current or Ex-Partner: No    Emotionally Abused: No    Physically Abused: No    Sexually Abused: No  Depression (PHQ2-9): Medium Risk (04/16/2023)   Depression (PHQ2-9)    PHQ-2 Score: 7  Alcohol Screen: Not on file  Housing: Low Risk (06/26/2022)   Housing    Last Housing Risk Score: 0  Utilities: Not At Risk (06/26/2022)   AHC Utilities    Threatened with loss of utilities: No  Health Literacy: Not on file     PHYSICAL EXAM: Vitals:   03/11/24 1215  BP: (!) 171/91  Pulse: 100  SpO2: 95%   General: No acute distress Head:  Normocephalic/atraumatic Skin/Extremities: No rash, no edema Neurological Exam: alert and awake. No aphasia or dysarthria. Fund of knowledge is appropriate.  Attention and concentration are normal.   Cranial nerves: Pupils equal, round. Extraocular movements intact with no nystagmus. Visual fields full.  No facial asymmetry.  Motor: Bulk and tone normal, muscle strength 5/5 throughout with no pronator drift.   Finger to nose testing intact.  Gait narrow-based and steady, able to tandem walk adequately.  Romberg negative.   IMPRESSION: This is a pleasant 49 yo RH woman with a history of von Willebrand disease, neuroendocrine tumor, with Juvenile Myoclonic Epilepsy since age 19. No convulsions since 2011, she has brief myoclonic jerks occurring 1-4 times a week when sleep deprived, GI issues have affected sleep. We discussed  increasing Zonisamide  to 200mg  at bedtime, continue Levetiracetam  750mg  at bedtime. They will start keeping a symptom diary as we increase Zonisamide . Continue Aimovig  and prn sumatriptan  for migraines. She is aware of [place] driving laws to stop driving after a seizure until 6 months seizure-free. Follow-up in 4 months, call for any changes.     Thank you for allowing me  to participate in her care.  Please do not hesitate to call for any questions or concerns.    Darice Shivers, M.D.   CC: Dr. Gerome       [1]  Current Outpatient Medications on File Prior to Visit  Medication Sig Dispense Refill   acetaminophen  (TYLENOL ) 325 MG tablet Take 1-2 tablets (325-650 mg total) by mouth every 4 (four) hours as needed for mild pain.     albuterol  (VENTOLIN  HFA) 108 (90 Base) MCG/ACT inhaler Inhale 2 puffs into the lungs every 4 (four) hours as needed.     amitriptyline  (ELAVIL ) 50 MG tablet TAKE 1/2- 1 TABLET BY MOUTH EVERY DAY AS NEEDED NAUSEA OR VOMITING 30 tablet 2   amLODipine -valsartan  (EXFORGE ) 10-320 MG tablet Take 1 tablet by mouth daily. (Patient not taking: Reported on 03/07/2024)     atorvastatin  (LIPITOR) 20 MG tablet Take 20 mg by mouth daily.     Azelastine HCl 137 MCG/SPRAY SOLN Place 137 mcg into the nose in the morning and at bedtime.     budesonide  (PULMICORT ) 0.5 MG/2ML nebulizer solution Take 1 mL (0.25 mg total) by nebulization 2 (two) times daily. 60 mL 0   carvedilol  (COREG ) 3.125 MG tablet Take 1 tablet (3.125 mg total) by mouth 2 (two) times daily with a meal. 60 tablet 0   colestipol  (COLESTID ) 1 g tablet Take 4 tablets (4 g total) by mouth 2 (two) times daily. 240 tablet 6   diphenoxylate -atropine  (LOMOTIL ) 2.5-0.025 MG tablet Take 2 tablets by mouth 4 (four) times daily. 30 tablet 0   Erenumab -aooe (AIMOVIG ) 140 MG/ML SOAJ Inject 140mg  into the skin every 28 days 1.12 mL 11   gabapentin  (NEURONTIN ) 100 MG capsule Take once a day as needed for neuropathy 90 capsule 3    levETIRAcetam  (KEPPRA ) 750 MG tablet Take 1 tablet (750 mg total) by mouth at bedtime. 90 tablet 3   loperamide  (IMODIUM  A-D) 2 MG tablet Take 2 mg by mouth 4 (four) times daily as needed for diarrhea or loose stools.     Magnesium  250 MG TABS Take 250 mg by mouth at bedtime.     mometasone  (NASONEX ) 50 MCG/ACT nasal spray Use one to two sprays in each nostril once daily 17 g 5   ondansetron  (ZOFRAN ) 4 MG tablet Take 4 mg by mouth every 8 (eight) hours as needed for nausea.     pantoprazole  (PROTONIX ) 40 MG tablet Take 40 mg by mouth daily.     potassium chloride  SA (KLOR-CON  M) 20 MEQ tablet TAKE 2 TABLETS(40 MEQ) BY MOUTH TWICE DAILY FOR 7 DAYS THEN TAKE 1 TABLET BY MOUTH TWICE DAILY 120 tablet 2   rosuvastatin (CRESTOR) 5 MG tablet Take 5 mg by mouth daily.     SUMAtriptan  (IMITREX ) 25 MG tablet Take 1 tablet (25 mg total) by mouth every 2 (two) hours as needed for migraine. May repeat in 2 hours if headache persists or recurs. 10 tablet 6   Tiotropium Bromide -Olodaterol (STIOLTO RESPIMAT ) 2.5-2.5 MCG/ACT AERS Inhale 2 puffs into the lungs daily. 4 g 0   zonisamide  (ZONEGRAN ) 100 MG capsule TAKE 1 CAPSULE BY MOUTH EVERY NIGHT 90 capsule 3   Current Facility-Administered Medications on File Prior to Visit  Medication Dose Route Frequency Provider Last Rate Last Admin   lanreotide acetate  (SOMATULINE DEPOT ) injection 120 mg  120 mg Subcutaneous Once Ennever, Peter R, MD      [2]  Allergies Allergen Reactions   Aspirin Other (See Comments)  Childhood reaction   Codeine Nausea And Vomiting    Tolerates Hydrocodone    Erythromycin Other (See Comments)    Childhood reaction, tolerate Zpak   Lactose Intolerance (Gi) Diarrhea   Nsaids Nausea And Vomiting   Penicillins Other (See Comments)    Has patient had a PCN reaction causing immediate rash, facial/tongue/throat swelling, SOB or lightheadedness with hypotension: doesn't remember childhood Reaction   Has patient had a PCN reaction causing  severe rash involving mucus membranes or skin necrosis: NO  Has patient had a PCN reaction that required hospitalization NO  Has patient had a PCN reaction occurring within the last 10 years: NO  If all of the above answers are NO, then may proceed with Cephalosporin use.   "

## 2024-03-12 LAB — SURGICAL PATHOLOGY

## 2024-03-13 ENCOUNTER — Other Ambulatory Visit: Payer: Self-pay | Admitting: Nurse Practitioner

## 2024-03-13 ENCOUNTER — Ambulatory Visit: Payer: Self-pay | Admitting: Gastroenterology

## 2024-03-13 DIAGNOSIS — C7A8 Other malignant neuroendocrine tumors: Secondary | ICD-10-CM

## 2024-03-18 ENCOUNTER — Inpatient Hospital Stay: Admitting: Nurse Practitioner

## 2024-03-18 ENCOUNTER — Inpatient Hospital Stay: Attending: Nurse Practitioner

## 2024-03-18 ENCOUNTER — Encounter: Payer: Self-pay | Admitting: Nurse Practitioner

## 2024-03-18 VITALS — BP 143/81 | HR 100 | Temp 98.4°F | Resp 18 | Ht 63.0 in | Wt 190.6 lb

## 2024-03-18 DIAGNOSIS — C7A012 Malignant carcinoid tumor of the ileum: Secondary | ICD-10-CM | POA: Insufficient documentation

## 2024-03-18 DIAGNOSIS — C7A8 Other malignant neuroendocrine tumors: Secondary | ICD-10-CM

## 2024-03-18 DIAGNOSIS — D6800 Von Willebrand disease, unspecified: Secondary | ICD-10-CM | POA: Insufficient documentation

## 2024-03-18 LAB — MAGNESIUM: Magnesium: 2.1 mg/dL (ref 1.7–2.4)

## 2024-03-18 LAB — BASIC METABOLIC PANEL - CANCER CENTER ONLY
Anion gap: 12 (ref 5–15)
BUN: 11 mg/dL (ref 6–20)
CO2: 30 mmol/L (ref 22–32)
Calcium: 9.5 mg/dL (ref 8.9–10.3)
Chloride: 100 mmol/L (ref 98–111)
Creatinine: 0.75 mg/dL (ref 0.44–1.00)
GFR, Estimated: >60 mL/min
Glucose, Bld: 147 mg/dL — ABNORMAL HIGH (ref 70–99)
Potassium: 3.5 mmol/L (ref 3.5–5.1)
Sodium: 141 mmol/L (ref 135–145)

## 2024-03-18 LAB — CBC WITH DIFFERENTIAL (CANCER CENTER ONLY)
Abs Immature Granulocytes: 0.03 K/uL (ref 0.00–0.07)
Basophils Absolute: 0 K/uL (ref 0.0–0.1)
Basophils Relative: 0 %
Eosinophils Absolute: 0.2 K/uL (ref 0.0–0.5)
Eosinophils Relative: 1 %
HCT: 40.8 % (ref 36.0–46.0)
Hemoglobin: 13.5 g/dL (ref 12.0–15.0)
Immature Granulocytes: 0 %
Lymphocytes Relative: 31 %
Lymphs Abs: 3.7 K/uL (ref 0.7–4.0)
MCH: 29.5 pg (ref 26.0–34.0)
MCHC: 33.1 g/dL (ref 30.0–36.0)
MCV: 89.3 fL (ref 80.0–100.0)
Monocytes Absolute: 0.5 K/uL (ref 0.1–1.0)
Monocytes Relative: 4 %
Neutro Abs: 7.4 K/uL (ref 1.7–7.7)
Neutrophils Relative %: 64 %
Platelet Count: 368 K/uL (ref 150–400)
RBC: 4.57 MIL/uL (ref 3.87–5.11)
RDW: 13.5 % (ref 11.5–15.5)
WBC Count: 11.8 K/uL — ABNORMAL HIGH (ref 4.0–10.5)
nRBC: 0 % (ref 0.0–0.2)

## 2024-03-18 NOTE — Progress Notes (Signed)
" °  [facility] OFFICE PROGRESS NOTE   Diagnosis: Carcinoid tumor  INTERVAL HISTORY:   [name] returns as scheduled.  No change in base bowel habits.  No rectal bleeding.  Occasional abdominal pain.  Appetite varies.  Weight overall stable.  Objective:  Vital signs in last 24 hours:  Blood pressure (!) 143/81, pulse 100, temperature 98.4 F (36.9 C), temperature source Temporal, resp. rate 18, height 5' 3 (1.6 m), weight 190 lb 9.6 oz (86.5 kg), last menstrual period 09/16/2012, SpO2 98%.    Lymphatics: No palpable cervical, supraclavicular, axillary or inguinal lymph nodes. Resp: Scattered expiratory wheezes.  No respiratory distress. Cardio: Regular rate and rhythm. GI: No hepatosplenomegaly.  No mass.  Nontender. Vascular: No leg edema.   Lab Results:  Lab Results  Component Value Date   WBC 11.8 (H) 03/18/2024   HGB 13.5 03/18/2024   HCT 40.8 03/18/2024   MCV 89.3 03/18/2024   PLT 368 03/18/2024   NEUTROABS 7.4 03/18/2024    Imaging:  No results found.  Medications: I have reviewed the patient's current medications.  Assessment/Plan: Carcinoid tumor of distal ileum, status post a right colectomy 05/05/2021 Colonoscopy 03/11/2021-polypoid lesion in the terminal ileum-well-differentiated neuroendocrine tumor Stage III, pT2 pN1, 1/7 nodes, grade 1, Ki-67 less than 3%, no lymphovascular invasion, negative margins Dotatate PET 08/17/2021-no evidence of residual neuroendocrine tumor Lanreotide 08/23/2021-discontinued after 1 dose Normal 24-hour urine 5 HIAA 06/19/2022 Normal chromogranin A 08/24/2023 Diarrhea predating and following the right colectomy and small bowel resection Improved with colestipol  April 2024 Chiari malformation, status post VP shunt 11/24/2016 Seizure disorder Neuropathy in the lower legs and feet following abdominal surgery March 2023 History of migraine headaches Von Willebrand's disease 8.   Asthma 9.  Hypertension 10.   Respiratory failure March 2023, intubated 05/12/2021, tracheostomy 05/30/2021, off ventilator 05/31/2021 11.  C. difficile colitis 12/16/2020 12.  Leukocytosis-likely secondary to ongoing tobacco use 13.  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  No change in baseline bowel habits of chronic diarrhea.  We will follow-up on the chromogranin A level from today.  We reviewed the CBC and chemistry panel.  She has persistent mild leukocytosis.  This may be related to tobacco use.  Potassium and magnesium  are normal.  She will return for an office visit and chromogranin A in 8 months.  We are available to see her sooner if needed   [name] ANP/GNP-BC   03/18/2024  10:17 AM        "

## 2024-03-20 ENCOUNTER — Telehealth: Payer: Self-pay | Admitting: *Deleted

## 2024-03-20 LAB — CHROMOGRANIN A: Chromogranin A (ng/mL): 44 ng/mL (ref 0.0–101.8)

## 2024-03-20 NOTE — Telephone Encounter (Signed)
 Per [name], NP  Please let her know chromogranin level is stable in normal range.  Follow-up as scheduled.

## 2024-04-18 ENCOUNTER — Ambulatory Visit: Payer: Self-pay | Admitting: Obstetrics and Gynecology

## 2024-07-11 ENCOUNTER — Ambulatory Visit: Payer: Self-pay | Admitting: Neurology

## 2024-11-17 ENCOUNTER — Inpatient Hospital Stay

## 2024-11-17 ENCOUNTER — Inpatient Hospital Stay: Admitting: Oncology
